# Patient Record
Sex: Male | Born: 1950 | Race: White | Hispanic: No | Marital: Married | Smoking: Current every day smoker
Health system: Southern US, Community
[De-identification: ages and names within clinical notes are randomized; demographics above are authoritative.]

## PROBLEM LIST (undated history)

## (undated) DIAGNOSIS — G20A1 Parkinson's disease without dyskinesia, without mention of fluctuations: Secondary | ICD-10-CM

## (undated) DIAGNOSIS — M1990 Unspecified osteoarthritis, unspecified site: Secondary | ICD-10-CM

## (undated) DIAGNOSIS — F419 Anxiety disorder, unspecified: Secondary | ICD-10-CM

## (undated) DIAGNOSIS — C61 Malignant neoplasm of prostate: Secondary | ICD-10-CM

## (undated) DIAGNOSIS — G20 Parkinson's disease: Secondary | ICD-10-CM

## (undated) DIAGNOSIS — M25561 Pain in right knee: Secondary | ICD-10-CM

## (undated) DIAGNOSIS — I10 Essential (primary) hypertension: Secondary | ICD-10-CM

## (undated) DIAGNOSIS — J302 Other seasonal allergic rhinitis: Secondary | ICD-10-CM

## (undated) DIAGNOSIS — K219 Gastro-esophageal reflux disease without esophagitis: Secondary | ICD-10-CM

## (undated) HISTORY — PX: PROSTATE BIOPSY: SHX241

## (undated) HISTORY — PX: OTHER SURGICAL HISTORY: SHX169

## (undated) HISTORY — PX: COLONOSCOPY: SHX174

---

## 2004-08-28 ENCOUNTER — Emergency Department (HOSPITAL_COMMUNITY): Admission: EM | Admit: 2004-08-28 | Discharge: 2004-08-28 | Payer: Self-pay | Admitting: Emergency Medicine

## 2005-06-02 ENCOUNTER — Encounter: Admission: RE | Admit: 2005-06-02 | Discharge: 2005-07-07 | Payer: Self-pay | Admitting: Family Medicine

## 2017-04-04 DIAGNOSIS — T7840XA Allergy, unspecified, initial encounter: Secondary | ICD-10-CM | POA: Diagnosis not present

## 2017-05-02 DIAGNOSIS — H43392 Other vitreous opacities, left eye: Secondary | ICD-10-CM | POA: Diagnosis not present

## 2017-05-28 DIAGNOSIS — N402 Nodular prostate without lower urinary tract symptoms: Secondary | ICD-10-CM | POA: Diagnosis not present

## 2017-05-28 DIAGNOSIS — J309 Allergic rhinitis, unspecified: Secondary | ICD-10-CM | POA: Diagnosis not present

## 2017-05-28 DIAGNOSIS — E7800 Pure hypercholesterolemia, unspecified: Secondary | ICD-10-CM | POA: Diagnosis not present

## 2017-05-28 DIAGNOSIS — I10 Essential (primary) hypertension: Secondary | ICD-10-CM | POA: Diagnosis not present

## 2017-05-28 DIAGNOSIS — Z23 Encounter for immunization: Secondary | ICD-10-CM | POA: Diagnosis not present

## 2017-05-28 DIAGNOSIS — M19049 Primary osteoarthritis, unspecified hand: Secondary | ICD-10-CM | POA: Diagnosis not present

## 2017-05-28 DIAGNOSIS — Z125 Encounter for screening for malignant neoplasm of prostate: Secondary | ICD-10-CM | POA: Diagnosis not present

## 2017-05-28 DIAGNOSIS — Z1211 Encounter for screening for malignant neoplasm of colon: Secondary | ICD-10-CM | POA: Diagnosis not present

## 2017-05-28 DIAGNOSIS — Z1159 Encounter for screening for other viral diseases: Secondary | ICD-10-CM | POA: Diagnosis not present

## 2017-05-28 DIAGNOSIS — Z0000 Encounter for general adult medical examination without abnormal findings: Secondary | ICD-10-CM | POA: Diagnosis not present

## 2017-05-29 ENCOUNTER — Other Ambulatory Visit: Payer: Self-pay | Admitting: Family Medicine

## 2017-05-29 DIAGNOSIS — Z136 Encounter for screening for cardiovascular disorders: Secondary | ICD-10-CM

## 2017-06-12 ENCOUNTER — Ambulatory Visit
Admission: RE | Admit: 2017-06-12 | Discharge: 2017-06-12 | Disposition: A | Discharge status: Home / Self Care | Payer: PPO | Source: Ambulatory Visit | Attending: Family Medicine | Admitting: Family Medicine

## 2017-06-12 DIAGNOSIS — Z136 Encounter for screening for cardiovascular disorders: Secondary | ICD-10-CM

## 2017-06-12 DIAGNOSIS — Z87891 Personal history of nicotine dependence: Secondary | ICD-10-CM | POA: Diagnosis not present

## 2017-08-24 DIAGNOSIS — K5730 Diverticulosis of large intestine without perforation or abscess without bleeding: Secondary | ICD-10-CM | POA: Diagnosis not present

## 2017-08-24 DIAGNOSIS — Z1211 Encounter for screening for malignant neoplasm of colon: Secondary | ICD-10-CM | POA: Diagnosis not present

## 2017-12-10 DIAGNOSIS — R972 Elevated prostate specific antigen [PSA]: Secondary | ICD-10-CM | POA: Diagnosis not present

## 2017-12-10 DIAGNOSIS — R3912 Poor urinary stream: Secondary | ICD-10-CM | POA: Diagnosis not present

## 2017-12-10 DIAGNOSIS — N401 Enlarged prostate with lower urinary tract symptoms: Secondary | ICD-10-CM | POA: Diagnosis not present

## 2017-12-25 DIAGNOSIS — E7800 Pure hypercholesterolemia, unspecified: Secondary | ICD-10-CM | POA: Diagnosis not present

## 2017-12-25 DIAGNOSIS — N402 Nodular prostate without lower urinary tract symptoms: Secondary | ICD-10-CM | POA: Diagnosis not present

## 2017-12-25 DIAGNOSIS — L989 Disorder of the skin and subcutaneous tissue, unspecified: Secondary | ICD-10-CM | POA: Diagnosis not present

## 2017-12-25 DIAGNOSIS — R7309 Other abnormal glucose: Secondary | ICD-10-CM | POA: Diagnosis not present

## 2018-01-24 DIAGNOSIS — R972 Elevated prostate specific antigen [PSA]: Secondary | ICD-10-CM | POA: Diagnosis not present

## 2018-02-01 ENCOUNTER — Other Ambulatory Visit (HOSPITAL_COMMUNITY): Payer: Self-pay | Admitting: Urology

## 2018-02-01 DIAGNOSIS — C61 Malignant neoplasm of prostate: Secondary | ICD-10-CM

## 2018-02-05 ENCOUNTER — Encounter: Payer: Self-pay | Admitting: Radiation Oncology

## 2018-02-08 ENCOUNTER — Encounter (HOSPITAL_COMMUNITY)
Admission: RE | Admit: 2018-02-08 | Discharge: 2018-02-08 | Disposition: A | Discharge status: Home / Self Care | Payer: PPO | Source: Ambulatory Visit | Attending: Urology | Admitting: Urology

## 2018-02-08 DIAGNOSIS — C61 Malignant neoplasm of prostate: Secondary | ICD-10-CM | POA: Insufficient documentation

## 2018-02-08 MED ORDER — TECHNETIUM TC 99M MEDRONATE IV KIT
20.0000 | PACK | Freq: Once | INTRAVENOUS | Status: AC | PRN
  Administered 2018-02-08: 20 via INTRAVENOUS

## 2018-02-25 ENCOUNTER — Other Ambulatory Visit: Payer: Self-pay

## 2018-02-25 ENCOUNTER — Ambulatory Visit
Admission: RE | Admit: 2018-02-25 | Discharge: 2018-02-25 | Disposition: A | Discharge status: Home / Self Care | Payer: PPO | Source: Ambulatory Visit | Attending: Radiation Oncology | Admitting: Radiation Oncology

## 2018-02-25 ENCOUNTER — Encounter: Payer: Self-pay | Admitting: Radiation Oncology

## 2018-02-25 ENCOUNTER — Encounter: Payer: Self-pay | Admitting: Medical Oncology

## 2018-02-25 ENCOUNTER — Encounter

## 2018-02-25 VITALS — BP 137/81 | HR 56 | Temp 98.3°F | Resp 20 | Ht 66.0 in | Wt 164.0 lb

## 2018-02-25 DIAGNOSIS — R972 Elevated prostate specific antigen [PSA]: Secondary | ICD-10-CM | POA: Diagnosis not present

## 2018-02-25 DIAGNOSIS — Z808 Family history of malignant neoplasm of other organs or systems: Secondary | ICD-10-CM | POA: Diagnosis not present

## 2018-02-25 DIAGNOSIS — C61 Malignant neoplasm of prostate: Secondary | ICD-10-CM | POA: Insufficient documentation

## 2018-02-25 HISTORY — DX: Malignant neoplasm of prostate: C61

## 2018-02-25 NOTE — Progress Notes (Signed)
See progress note under physician encounter. 

## 2018-02-25 NOTE — Progress Notes (Signed)
Introduced myself to Matthew [name] and his wife as the prostate nurse navigator and my role. He states he is feeling a little overwhelmed with the diagnosis and now hearing lot of information regarding treatment options. He has follow up tomorrow with Dr. Lovena Neighbours to discuss surgery in detail. I encouraged them to listen and go home and consider options. I asked them to reach out to me if they have questions or concerns.

## 2018-02-25 NOTE — Progress Notes (Signed)
GU Location of Tumor / Histology: prostatic adenocarcinoma  If Prostate Cancer, Gleason Score is (4 + 5) and PSA is (7.86). Prostate volume: 27.95 grams.   Matthew [name] reports that two years ago his PCP found a lump on his prostate. Explains that his urologist was unable to find the lump. Reports his PSA in January 2019 jumped from 3.5 to 5 thus he went back to his urologist. He goes onto say by the time his urologist was able to see him approximately 4 months later his PSA had jumped to 7.  Biopsies of prostate (if applicable) revealed:    Past/Anticipated interventions by urology, if any: prostate biopsy, CT and bone scan (negative), referral to radiation oncology  Past/Anticipated interventions by medical oncology, if any: no  Weight changes, if any: no  Bowel/Bladder complaints, if any: IPSS 10. SHIM 7. Denies dysuria. Denies hematuria. Reports rare scant leakage of urine.    Nausea/Vomiting, if any: no  Pain issues, if any:  Reports pain in groin since biopsy.   SAFETY ISSUES:  Prior radiation? no  Pacemaker/ICD? no  Possible current pregnancy? no  Is the patient on methotrexate? no  Current Complaints / other details:  67 year old male. Married with one daughter and one son. Smokes a pipe.

## 2018-02-25 NOTE — Progress Notes (Signed)
2 Radiation Oncology         [phone] ________________________________  Initial Outpatient Consultation  Name: Matthew [name] MRN: [id number]  Date: 02/25/2018  DOB: [birth year]  IZ:TIWP, Dwyane Luo, MD  Davis Gourd*   REFERRING PHYSICIAN: Davis Gourd*  DIAGNOSIS: 67 y.o. gentleman with high risk, Stage T1c adenocarcinoma of the prostate with Gleason Score of 4+5, and PSA of 7.86.     ICD-10-CM   1. Malignant neoplasm of prostate ([place]) C61     HISTORY OF PRESENT ILLNESS: Matthew [name] is a 67 y.o. male with a diagnosis of prostate cancer. He reports that two years ago his PCP found a lump on his prostate, but he explains that his urologist was unable to find the lump. He reports his PSA in January 2019 jumped from 3.5 in 2017 to 5 thus he went back to urology. He saw Dr. Lovena Neighbours on 12/10/17,  digital rectal examination was performed at that time revealing left lobe firm and larger but no nodules. PSA performed the same day was 7.86. The patient proceeded to transrectal ultrasound with 12 biopsies of the prostate on 01/24/18.  The prostate volume measured 27.9 cc.  Out of 12 core biopsies, all 12 were positive.  The maximum Gleason score was 4+5, and this was seen in left apex. Perineural invasion was identified in left mid lateral, right mid, and right base lateral. CT abdomen/pelvis on 02/01/18 and bone scan on 02/08/18 were both negative for metastatic disease. The patient reviewed the biopsy results with his urologist and he has kindly been referred today for discussion of potential radiation treatment options.   PREVIOUS RADIATION THERAPY: No  PAST MEDICAL HISTORY:  Past Medical History:  Diagnosis Date  . Prostate cancer ([place])       PAST SURGICAL HISTORY: Past Surgical History:  Procedure Laterality Date  . PROSTATE BIOPSY      FAMILY HISTORY:  Family History  Problem Relation Age of Onset  . Breast cancer Mother        45s  . Heart attack Father  91  . Colon cancer Maternal Grandmother   . Prostate cancer Neg Hx   . Pancreatic cancer Neg Hx     SOCIAL HISTORY:  Social History   Socioeconomic History  . Marital status: Married    Spouse name: Not on file  . Number of children: 2  . Years of education: Not on file  . Highest education level: Not on file  Occupational History  . Not on file  Social Needs  . Financial resource strain: Not on file  . Food insecurity:    Worry: Not on file    Inability: Not on file  . Transportation needs:    Medical: Not on file    Non-medical: Not on file  Tobacco Use  . Smoking status: Current Every Day Smoker    Years: 30.00    Types: Pipe  . Smokeless tobacco: Never Used  Substance and Sexual Activity  . Alcohol use: Never    Frequency: Never  . Drug use: Never  . Sexual activity: Not Currently  Lifestyle  . Physical activity:    Days per week: Not on file    Minutes per session: Not on file  . Stress: Not on file  Relationships  . Social connections:    Talks on phone: Not on file    Gets together: Not on file    Attends religious service: Not on file    Active member of  club or organization: Not on file    Attends meetings of clubs or organizations: Not on file    Relationship status: Not on file  . Intimate partner violence:    Fear of current or ex partner: Not on file    Emotionally abused: Not on file    Physically abused: Not on file    Forced sexual activity: Not on file  Other Topics Concern  . Not on file  Social History Narrative  . Not on file    ALLERGIES: Patient has no known allergies.  MEDICATIONS:  Current Outpatient Medications  Medication Sig Dispense Refill  . calcium citrate-vitamin D (CITRACAL+D) 315-200 MG-UNIT tablet Take 1 tablet by mouth 2 (two) times daily.    . cetirizine (ZYRTEC) 10 MG tablet Take 10 mg by mouth daily.    . Grape Seed Extract 100 MG CAPS Take by mouth.    Marland Kitchen lisinopril-hydrochlorothiazide (PRINZIDE,ZESTORETIC)  20-12.5 MG tablet     . vitamin B-12 (CYANOCOBALAMIN) 100 MCG tablet Take 100 mcg by mouth daily.    . tamsulosin (FLOMAX) 0.4 MG CAPS capsule Take 0.4 mg by mouth daily.  11   No current facility-administered medications for this encounter.     REVIEW OF SYSTEMS:  On review of systems, the patient reports that he is doing well overall. He denies any chest pain, shortness of breath, cough, fevers, chills, night sweats, unintended weight changes. He denies any bowel disturbances, and denies abdominal pain, nausea or vomiting. He reports groin pain since biopsy. His IPSS was 10, indicating moderate urinary symptoms. He is unable to complete sexual activity with most attempts. A complete review of systems is obtained and is otherwise negative.    PHYSICAL EXAM:  Wt Readings from Last 3 Encounters:  02/25/18 164 lb (74.4 kg)   Temp Readings from Last 3 Encounters:  02/25/18 98.3 F (36.8 C) (Oral)   BP Readings from Last 3 Encounters:  02/25/18 137/81   Pulse Readings from Last 3 Encounters:  02/25/18 (!) 56   Pain Assessment Pain Score: 0-No pain/10  In general this is a well appearing Caucasian gentleman in no acute distress. He is alert and oriented x4 and appropriate throughout the examination. Cardiopulmonary assessment is negative for acute distress and he exhibits normal effort.  Marland Kitchen   KPS = 100  100 - Normal; no complaints; no evidence of disease. 90   - Able to carry on normal activity; minor signs or symptoms of disease. 80   - Normal activity with effort; some signs or symptoms of disease. 26   - Cares for self; unable to carry on normal activity or to do active work. 60   - Requires occasional assistance, but is able to care for most of his personal needs. 50   - Requires considerable assistance and frequent medical care. 48   - Disabled; requires special care and assistance. 63   - Severely disabled; hospital admission is indicated although death not imminent. 43   -  Very sick; hospital admission necessary; active supportive treatment necessary. 10   - Moribund; fatal processes progressing rapidly. 0     - Dead  Karnofsky DA, Abelmann WH, Craver LS and Burchenal JH [phone]) The use of the nitrogen mustards in the palliative treatment of carcinoma: with particular reference to bronchogenic carcinoma Cancer 1 [id number]  LABORATORY DATA:  No results found for: WBC, HGB, HCT, MCV, PLT No results found for: NA, K, CL, CO2 No results found for: ALT, AST, GGT, ALKPHOS,  BILITOT   RADIOGRAPHY: Nm Bone Scan Whole Body  Result Date: 02/08/2018 CLINICAL DATA:  Prostate cancer.  History of right knee pain. EXAM: NUCLEAR MEDICINE WHOLE BODY BONE SCAN TECHNIQUE: Whole body anterior and posterior images were obtained approximately 3 hours after intravenous injection of radiopharmaceutical. RADIOPHARMACEUTICALS:  20 mCi Technetium-24m MDP IV COMPARISON:  CT of the abdomen pelvis 02/01/2018 FINDINGS: Normal uptake in the renal collecting system and urinary bladder. Markedly increased uptake involving the right knee particularly along the medial aspect. 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 No suspicious uptake in the pelvis, spine or ribs. Slightly increased uptake along the left side of the maxilla is probably odontogenic. IMPRESSION: 1. No evidence for metastatic bone disease. 2. Markedly increased uptake involving the right knee is likely related to significant degenerative disease. 3. Increased uptake along the left maxilla is probably related to dental disease. Electronically Signed   By: Markus Daft M.D.   On: 02/08/2018 17:04      IMPRESSION/PLAN: 1. 67 y.o. gentleman with high risk, Stage T1c adenocarcinoma of the prostate with Gleason Score of 4+5, and PSA of 7.86. Dr. Tammi Klippel discussed the patient's workup and outlines the nature of prostate cancer in this setting. The patient's T stage, Gleason's score, and PSA put him into the high risk group. Accordingly,  he is eligible for a variety of potential treatment options including 8 weeks of external radiation, or 5 weeks of external radiation followed by a brachytherapy boost versus surgical resection. We discussed the available radiation techniques, and focused on the details and logistics and delivery. We discussed and outlined the risks, benefits, short and long-term effects associated with radiotherapy and compared and contrasted these with prostatectomy. We discussed the role of SpaceOAR in reducing the rectal toxicity associated with radiotherapy. He will meet with Dr. Lovena Neighbours tomorrow and we will follow up with his decision making. 2. Possible genetic predisposition to malignancy. The patient is not quite a candidate for genetic testing but would be if another family member had breast or prostate cancer.     Carola Rhine, PAC    Tyler Pita, MD  [facility] Direct Dial: [phone]  Fax: [phone] [email or web address]  Skype  [email or web address]    Page Me     This document serves as a record of services personally performed by Shona Simpson, PA-C and Dr. Tammi Klippel. It was created on their behalf by Wilburn Mylar, a trained medical scribe. The creation of this record is based on the scribe's personal observations and the provider's statements to them. This document has been checked and approved by the attending provider.

## 2018-02-26 DIAGNOSIS — C61 Malignant neoplasm of prostate: Secondary | ICD-10-CM | POA: Diagnosis not present

## 2018-02-27 ENCOUNTER — Other Ambulatory Visit: Payer: Self-pay | Admitting: Urology

## 2018-02-27 DIAGNOSIS — C61 Malignant neoplasm of prostate: Secondary | ICD-10-CM | POA: Insufficient documentation

## 2018-03-01 DIAGNOSIS — N401 Enlarged prostate with lower urinary tract symptoms: Secondary | ICD-10-CM | POA: Diagnosis not present

## 2018-03-01 DIAGNOSIS — N393 Stress incontinence (female) (male): Secondary | ICD-10-CM | POA: Diagnosis not present

## 2018-03-01 DIAGNOSIS — R300 Dysuria: Secondary | ICD-10-CM | POA: Diagnosis not present

## 2018-03-01 DIAGNOSIS — M6281 Muscle weakness (generalized): Secondary | ICD-10-CM | POA: Diagnosis not present

## 2018-03-14 DIAGNOSIS — N393 Stress incontinence (female) (male): Secondary | ICD-10-CM | POA: Diagnosis not present

## 2018-03-14 DIAGNOSIS — C61 Malignant neoplasm of prostate: Secondary | ICD-10-CM | POA: Diagnosis not present

## 2018-03-14 DIAGNOSIS — M6281 Muscle weakness (generalized): Secondary | ICD-10-CM | POA: Diagnosis not present

## 2018-03-14 DIAGNOSIS — M62838 Other muscle spasm: Secondary | ICD-10-CM | POA: Diagnosis not present

## 2018-03-15 ENCOUNTER — Encounter (HOSPITAL_COMMUNITY): Payer: Self-pay

## 2018-03-15 DIAGNOSIS — Z23 Encounter for immunization: Secondary | ICD-10-CM | POA: Diagnosis not present

## 2018-03-15 NOTE — Patient Instructions (Addendum)
Your procedure is scheduled on: Friday, Nov. 1, 2019   Surgery Time:  12Noon-4:00PM   Report to [facility]  Entrance    Report to admitting at 10:00 AM   Call this number if you have problems the morning of surgery [phone]   Do not eat food or drink liquids :After Midnight.   Brush your teeth the morning of surgery.   Do NOT smoke after Midnight   Take these medicines the morning of surgery with A SIP OF WATER: Zyrtec, Colace              You may not have any metal on your body including jewelry, and body piercings             Do not wear lotions, powders, perfumes/cologne, or deodorant                        Men may shave face and neck.   Do not bring valuables to the hospital. [place].   Contacts, dentures or bridgework may not be worn into surgery.   Leave suitcase in the car. After surgery it may be brought to your room.    Special Instructions: Bring a copy of your healthcare power of attorney and living will documents         the day of surgery if you haven't scanned them in before.              Please read over the following fact sheets you were given:  [facility] - Preparing for Surgery Before surgery, you can play an important role.  Because skin is not sterile, your skin needs to be as free of germs as possible.  You can reduce the number of germs on your skin by washing with CHG (chlorahexidine gluconate) soap before surgery.  CHG is an antiseptic cleaner which kills germs and bonds with the skin to continue killing germs even after washing. Please DO NOT use if you have an allergy to CHG or antibacterial soaps.  If your skin becomes reddened/irritated stop using the CHG and inform your nurse when you arrive at Short Stay. Do not shave (including legs and underarms) for at least 48 hours prior to the first CHG shower.  You may shave your face/neck.  Please follow these instructions  carefully:  1.  Shower with CHG Soap the night before surgery and the  morning of surgery.  2.  If you choose to wash your hair, wash your hair first as usual with your normal  shampoo.  3.  After you shampoo, rinse your hair and body thoroughly to remove the shampoo.                             4.  Use CHG as you would any other liquid soap.  You can apply chg directly to the skin and wash.  Gently with a scrungie or clean washcloth.  5.  Apply the CHG Soap to your body ONLY FROM THE NECK DOWN.   Do   not use on face/ open                           Wound or open sores. Avoid contact with eyes, ears mouth and  genitals (private parts).                       Wash face,  Genitals (private parts) with your normal soap.             6.  Wash thoroughly, paying special attention to the area where your    surgery  will be performed.  7.  Thoroughly rinse your body with warm water from the neck down.  8.  DO NOT shower/wash with your normal soap after using and rinsing off the CHG Soap.                9.  Pat yourself dry with a clean towel.            10.  Wear clean pajamas.            11.  Place clean sheets on your bed the night of your first shower and do not  sleep with pets. Day of Surgery : Do not apply any lotions/deodorants the morning of surgery.  Please wear clean clothes to the hospital/surgery center.  FAILURE TO FOLLOW THESE INSTRUCTIONS MAY RESULT IN THE CANCELLATION OF YOUR SURGERY  PATIENT SIGNATURE_________________________________  NURSE SIGNATURE__________________________________  ________________________________________________________________________   Matthew [name]  An incentive spirometer is a tool that can help keep your lungs clear and active. This tool measures how well you are filling your lungs with each breath. Taking long deep breaths may help reverse or decrease the chance of developing breathing (pulmonary) problems (especially infection) following:  A  long period of time when you are unable to move or be active. BEFORE THE PROCEDURE   If the spirometer includes an indicator to show your best effort, your nurse or respiratory therapist will set it to a desired goal.  If possible, sit up straight or lean slightly forward. Try not to slouch.  Hold the incentive spirometer in an upright position. INSTRUCTIONS FOR USE  1. Sit on the edge of your bed if possible, or sit up as far as you can in bed or on a chair. 2. Hold the incentive spirometer in an upright position. 3. Breathe out normally. 4. Place the mouthpiece in your mouth and seal your lips tightly around it. 5. Breathe in slowly and as deeply as possible, raising the piston or the ball toward the top of the column. 6. Hold your breath for 3-5 seconds or for as long as possible. Allow the piston or ball to fall to the bottom of the column. 7. Remove the mouthpiece from your mouth and breathe out normally. 8. Rest for a few seconds and repeat Steps 1 through 7 at least 10 times every 1-2 hours when you are awake. Take your time and take a few normal breaths between deep breaths. 9. The spirometer may include an indicator to show your best effort. Use the indicator as a goal to work toward during each repetition. 10. After each set of 10 deep breaths, practice coughing to be sure your lungs are clear. If you have an incision (the cut made at the time of surgery), support your incision when coughing by placing a pillow or rolled up towels firmly against it. Once you are able to get out of bed, walk around indoors and cough well. You may stop using the incentive spirometer when instructed by your caregiver.  RISKS AND COMPLICATIONS  Take your time so you do not get dizzy or light-headed.  If you are in  pain, you may need to take or ask for pain medication before doing incentive spirometry. It is harder to take a deep breath if you are having pain. AFTER USE  Rest and breathe slowly and  easily.  It can be helpful to keep track of a log of your progress. Your caregiver can provide you with a simple table to help with this. If you are using the spirometer at home, follow these instructions: Matthew [name] IF:   You are having difficultly using the spirometer.  You have trouble using the spirometer as often as instructed.  Your pain medication is not giving enough relief while using the spirometer.  You develop fever of 100.5 F (38.1 C) or higher. SEEK IMMEDIATE MEDICAL CARE IF:   You cough up bloody sputum that had not been present before.  You develop fever of 102 F (38.9 C) or greater.  You develop worsening pain at or near the incision site. MAKE SURE YOU:   Understand these instructions.  Will watch your condition.  Will get help right away if you are not doing well or get worse. Document Released: 09/18/2006 Document Revised: 07/31/2011 Document Reviewed: 11/19/2006 Exit[employer] Patient Information 2014 Exit[employer], [place].   ________________________________________________________________________  WHAT IS A BLOOD TRANSFUSION? Blood Transfusion Information  A transfusion is the replacement of blood or some of its parts. Blood is made up of multiple cells which provide different functions.  Red blood cells carry oxygen and are used for blood loss replacement.  White blood cells fight against infection.  Platelets control bleeding.  Plasma helps clot blood.  Other blood products are available for specialized needs, such as hemophilia or other clotting disorders. BEFORE THE TRANSFUSION  Who gives blood for transfusions?   Healthy volunteers who are fully evaluated to make sure their blood is safe. This is blood bank blood. Transfusion therapy is the safest it has ever been in the practice of medicine. Before blood is taken from a donor, a complete history is taken to make sure that person has no history of diseases nor engages in risky social  behavior (examples are intravenous drug use or sexual activity with multiple partners). The donor's travel history is screened to minimize risk of transmitting infections, such as malaria. The donated blood is tested for signs of infectious diseases, such as HIV and hepatitis. The blood is then tested to be sure it is compatible with you in order to minimize the chance of a transfusion reaction. If you or a relative donates blood, this is often done in anticipation of surgery and is not appropriate for emergency situations. It takes many days to process the donated blood. RISKS AND COMPLICATIONS Although transfusion therapy is very safe and saves many lives, the main dangers of transfusion include:   Getting an infectious disease.  Developing a transfusion reaction. This is an allergic reaction to something in the blood you were given. Every precaution is taken to prevent this. The decision to have a blood transfusion has been considered carefully by your caregiver before blood is given. Blood is not given unless the benefits outweigh the risks. AFTER THE TRANSFUSION  Right after receiving a blood transfusion, you will usually feel much better and more energetic. This is especially true if your red blood cells have gotten low (anemic). The transfusion raises the level of the red blood cells which carry oxygen, and this usually causes an energy increase.  The nurse administering the transfusion will monitor you carefully for complications. HOME CARE INSTRUCTIONS  No special instructions are needed after a transfusion. You may find your energy is better. Speak with your caregiver about any limitations on activity for underlying diseases you may have. SEEK MEDICAL CARE IF:   Your condition is not improving after your transfusion.  You develop redness or irritation at the intravenous (IV) site. SEEK IMMEDIATE MEDICAL CARE IF:  Any of the following symptoms occur over the next 12 hours:  Shaking  chills.  You have a temperature by mouth above 102 F (38.9 C), not controlled by medicine.  Chest, back, or muscle pain.  People around you feel you are not acting correctly or are confused.  Shortness of breath or difficulty breathing.  Dizziness and fainting.  You get a rash or develop hives.  You have a decrease in urine output.  Your urine turns a dark color or changes to pink, red, or brown. Any of the following symptoms occur over the next 10 days:  You have a temperature by mouth above 102 F (38.9 C), not controlled by medicine.  Shortness of breath.  Weakness after normal activity.  The white part of the eye turns yellow (jaundice).  You have a decrease in the amount of urine or are urinating less often.  Your urine turns a dark color or changes to pink, red, or brown. Document Released: 05/05/2000 Document Revised: 07/31/2011 Document Reviewed: 12/23/2007 [facility] Patient Information 2014 [place], [place].  _______________________________________________________________________

## 2018-03-19 ENCOUNTER — Other Ambulatory Visit: Payer: Self-pay

## 2018-03-19 ENCOUNTER — Encounter (HOSPITAL_COMMUNITY)
Admission: RE | Admit: 2018-03-19 | Discharge: 2018-03-19 | Disposition: A | Discharge status: Home / Self Care | Payer: PPO | Source: Ambulatory Visit | Attending: Urology | Admitting: Urology

## 2018-03-19 ENCOUNTER — Encounter (HOSPITAL_COMMUNITY): Payer: Self-pay

## 2018-03-19 DIAGNOSIS — C61 Malignant neoplasm of prostate: Secondary | ICD-10-CM | POA: Insufficient documentation

## 2018-03-19 DIAGNOSIS — Z01818 Encounter for other preprocedural examination: Secondary | ICD-10-CM | POA: Insufficient documentation

## 2018-03-19 HISTORY — DX: Gastro-esophageal reflux disease without esophagitis: K21.9

## 2018-03-19 HISTORY — DX: Other seasonal allergic rhinitis: J30.2

## 2018-03-19 HISTORY — DX: Essential (primary) hypertension: I10

## 2018-03-19 HISTORY — DX: Unspecified osteoarthritis, unspecified site: M19.90

## 2018-03-19 HISTORY — DX: Pain in right knee: M25.561

## 2018-03-19 LAB — BASIC METABOLIC PANEL
Anion gap: 7 (ref 5–15)
BUN: 14 mg/dL (ref 8–23)
CO2: 28 mmol/L (ref 22–32)
Calcium: 9.2 mg/dL (ref 8.9–10.3)
Chloride: 103 mmol/L (ref 98–111)
Creatinine, Ser: 0.86 mg/dL (ref 0.61–1.24)
GFR calc Af Amer: >60 mL/min (ref >60)
GFR calc non Af Amer: >60 mL/min (ref >60)
Glucose, Bld: 132 mg/dL — ABNORMAL HIGH (ref 70–99)
Potassium: 3.9 mmol/L (ref 3.5–5.1)
Sodium: 138 mmol/L (ref 135–145)

## 2018-03-19 LAB — CBC
HCT: 45.5 % (ref 39.0–52.0)
Hemoglobin: 15.3 g/dL (ref 13.0–17.0)
MCH: 30.7 pg (ref 26.0–34.0)
MCHC: 33.6 g/dL (ref 30.0–36.0)
MCV: 91.2 fL (ref 80.0–100.0)
Platelets: 143 10*3/uL — ABNORMAL LOW (ref 150–400)
RBC: 4.99 MIL/uL (ref 4.22–5.81)
RDW: 12 % (ref 11.5–15.5)
WBC: 5.2 10*3/uL (ref 4.0–10.5)
nRBC: 0 % (ref 0.0–0.2)

## 2018-03-19 LAB — ABO/RH: ABO/RH(D): O POS

## 2018-03-19 NOTE — Pre-Procedure Instructions (Signed)
EKG 04/04/2017 in hard chart  CBC and BMP results 03/19/2018 sent to Dr. Dema Severin via epic.

## 2018-03-20 ENCOUNTER — Telehealth: Payer: Self-pay | Admitting: Radiation Oncology

## 2018-03-20 NOTE — Telephone Encounter (Signed)
Called to wish pt all the best with his upcoming surgery for prostate cancer treatment.

## 2018-03-22 ENCOUNTER — Encounter: Payer: Self-pay | Admitting: Medical Oncology

## 2018-03-22 ENCOUNTER — Encounter (HOSPITAL_COMMUNITY): Payer: Self-pay | Admitting: Emergency Medicine

## 2018-03-22 ENCOUNTER — Ambulatory Visit (HOSPITAL_COMMUNITY): Payer: PPO | Admitting: Certified Registered"

## 2018-03-22 ENCOUNTER — Ambulatory Visit (HOSPITAL_COMMUNITY)
Admission: RE | Admit: 2018-03-22 | Discharge: 2018-03-24 | Disposition: A | Discharge status: Home / Self Care | Payer: PPO | Source: Ambulatory Visit | Attending: Urology | Admitting: Urology

## 2018-03-22 ENCOUNTER — Other Ambulatory Visit: Payer: Self-pay

## 2018-03-22 ENCOUNTER — Encounter (HOSPITAL_COMMUNITY)
Admission: RE | Disposition: A | Discharge status: Home / Self Care | Payer: Self-pay | Source: Ambulatory Visit | Attending: Urology

## 2018-03-22 DIAGNOSIS — Z79899 Other long term (current) drug therapy: Secondary | ICD-10-CM | POA: Diagnosis not present

## 2018-03-22 DIAGNOSIS — I10 Essential (primary) hypertension: Secondary | ICD-10-CM | POA: Insufficient documentation

## 2018-03-22 DIAGNOSIS — M1990 Unspecified osteoarthritis, unspecified site: Secondary | ICD-10-CM | POA: Diagnosis not present

## 2018-03-22 DIAGNOSIS — C61 Malignant neoplasm of prostate: Secondary | ICD-10-CM | POA: Insufficient documentation

## 2018-03-22 DIAGNOSIS — K219 Gastro-esophageal reflux disease without esophagitis: Secondary | ICD-10-CM | POA: Diagnosis not present

## 2018-03-22 DIAGNOSIS — C775 Secondary and unspecified malignant neoplasm of intrapelvic lymph nodes: Secondary | ICD-10-CM | POA: Insufficient documentation

## 2018-03-22 DIAGNOSIS — R110 Nausea: Secondary | ICD-10-CM | POA: Diagnosis not present

## 2018-03-22 HISTORY — PX: ROBOT ASSISTED LAPAROSCOPIC RADICAL PROSTATECTOMY: SHX5141

## 2018-03-22 HISTORY — PX: PELVIC LYMPH NODE DISSECTION: SHX6543

## 2018-03-22 SURGERY — PROSTATECTOMY, RADICAL, ROBOT-ASSISTED, LAPAROSCOPIC
Anesthesia: General

## 2018-03-22 MED ORDER — CEFAZOLIN SODIUM-DEXTROSE 2-4 GM/100ML-% IV SOLN
2.0000 g | Freq: Once | INTRAVENOUS | Status: AC
  Administered 2018-03-22 (×2): 2 g via INTRAVENOUS
  Filled 2018-03-22: qty 100

## 2018-03-22 MED ORDER — LIDOCAINE 2% (20 MG/ML) 5 ML SYRINGE
INTRAMUSCULAR | Status: AC
  Filled 2018-03-22: qty 5

## 2018-03-22 MED ORDER — SUGAMMADEX SODIUM 200 MG/2ML IV SOLN
INTRAVENOUS | Status: DC | PRN
  Administered 2018-03-22: 200 mg via INTRAVENOUS

## 2018-03-22 MED ORDER — MIDAZOLAM HCL 2 MG/2ML IJ SOLN
INTRAMUSCULAR | Status: AC
  Filled 2018-03-22: qty 2

## 2018-03-22 MED ORDER — OXYBUTYNIN CHLORIDE 5 MG PO TABS: 5.0000 mg | ORAL_TABLET | Freq: Three times a day (TID) | ORAL | 2 refills | Status: DC | PRN

## 2018-03-22 MED ORDER — LACTATED RINGERS IR SOLN
Status: DC | PRN
  Administered 2018-03-22: 1

## 2018-03-22 MED ORDER — PROMETHAZINE HCL 25 MG/ML IJ SOLN
6.2500 mg | INTRAMUSCULAR | Status: DC | PRN
  Administered 2018-03-22: 6.25 mg via INTRAVENOUS

## 2018-03-22 MED ORDER — ROCURONIUM BROMIDE 10 MG/ML (PF) SYRINGE
PREFILLED_SYRINGE | INTRAVENOUS | Status: AC
  Filled 2018-03-22: qty 10

## 2018-03-22 MED ORDER — FENTANYL CITRATE (PF) 100 MCG/2ML IJ SOLN
INTRAMUSCULAR | Status: DC | PRN
  Administered 2018-03-22 (×2): 50 ug via INTRAVENOUS

## 2018-03-22 MED ORDER — ROCURONIUM BROMIDE 10 MG/ML (PF) SYRINGE
PREFILLED_SYRINGE | INTRAVENOUS | Status: AC
  Filled 2018-03-22: qty 20

## 2018-03-22 MED ORDER — OXYCODONE HCL 5 MG/5ML PO SOLN
5.0000 mg | Freq: Once | ORAL | Status: DC | PRN
  Filled 2018-03-22: qty 5

## 2018-03-22 MED ORDER — LACTATED RINGERS IV SOLN
INTRAVENOUS | Status: DC
  Administered 2018-03-22 (×3): via INTRAVENOUS

## 2018-03-22 MED ORDER — LIDOCAINE 2% (20 MG/ML) 5 ML SYRINGE
INTRAMUSCULAR | Status: DC | PRN
  Administered 2018-03-22: 100 mg via INTRAVENOUS

## 2018-03-22 MED ORDER — HYDROCODONE-ACETAMINOPHEN 5-325 MG PO TABS: 1.0000 | ORAL_TABLET | ORAL | 0 refills | Status: AC | PRN

## 2018-03-22 MED ORDER — OXYCODONE HCL 5 MG PO TABS: 10.0000 mg | ORAL_TABLET | ORAL | Status: DC | PRN

## 2018-03-22 MED ORDER — OXYCODONE HCL 5 MG PO TABS: 5.0000 mg | ORAL_TABLET | Freq: Once | ORAL | Status: DC | PRN

## 2018-03-22 MED ORDER — FENTANYL CITRATE (PF) 250 MCG/5ML IJ SOLN
INTRAMUSCULAR | Status: DC | PRN
  Administered 2018-03-22 (×5): 50 ug via INTRAVENOUS

## 2018-03-22 MED ORDER — ACETAMINOPHEN 10 MG/ML IV SOLN: 1000.0000 mg | Freq: Once | INTRAVENOUS | Status: DC | PRN

## 2018-03-22 MED ORDER — ONDANSETRON HCL 4 MG/2ML IJ SOLN
INTRAMUSCULAR | Status: AC
  Filled 2018-03-22: qty 2

## 2018-03-22 MED ORDER — SODIUM CHLORIDE 0.9 % IV SOLN
2.0000 g | Freq: Three times a day (TID) | INTRAVENOUS | Status: AC
  Administered 2018-03-23 (×2): 2 g via INTRAVENOUS
  Filled 2018-03-22 (×4): qty 2

## 2018-03-22 MED ORDER — SENNA 8.6 MG PO TABS: 1.0000 | ORAL_TABLET | Freq: Every day | ORAL | 0 refills | Status: AC

## 2018-03-22 MED ORDER — ONDANSETRON HCL 4 MG PO TABS: 4.0000 mg | ORAL_TABLET | Freq: Three times a day (TID) | ORAL | 0 refills | Status: AC | PRN

## 2018-03-22 MED ORDER — ACETAMINOPHEN 500 MG PO TABS: 1000.0000 mg | ORAL_TABLET | Freq: Once | ORAL | Status: DC | PRN

## 2018-03-22 MED ORDER — DEXAMETHASONE SODIUM PHOSPHATE 10 MG/ML IJ SOLN
INTRAMUSCULAR | Status: DC | PRN
  Administered 2018-03-22: 8 mg via INTRAVENOUS

## 2018-03-22 MED ORDER — ACETAMINOPHEN 160 MG/5ML PO SOLN: 1000.0000 mg | Freq: Once | ORAL | Status: DC | PRN

## 2018-03-22 MED ORDER — DEXAMETHASONE SODIUM PHOSPHATE 10 MG/ML IJ SOLN
INTRAMUSCULAR | Status: AC
  Filled 2018-03-22: qty 1

## 2018-03-22 MED ORDER — SODIUM CHLORIDE 0.9 % IV SOLN
INTRAVENOUS | Status: AC
  Administered 2018-03-22: 20:00:00 via INTRAVENOUS

## 2018-03-22 MED ORDER — FENTANYL CITRATE (PF) 250 MCG/5ML IJ SOLN
INTRAMUSCULAR | Status: AC
  Filled 2018-03-22: qty 5

## 2018-03-22 MED ORDER — BUPIVACAINE LIPOSOME 1.3 % IJ SUSP
20.0000 mL | Freq: Once | INTRAMUSCULAR | Status: AC
  Administered 2018-03-22: 20 mL
  Filled 2018-03-22: qty 20

## 2018-03-22 MED ORDER — SODIUM CHLORIDE 0.9 % IV BOLUS
1000.0000 mL | Freq: Once | INTRAVENOUS | Status: AC
  Administered 2018-03-22: 1000 mL via INTRAVENOUS

## 2018-03-22 MED ORDER — OXYBUTYNIN CHLORIDE 5 MG PO TABS: 5.0000 mg | ORAL_TABLET | Freq: Three times a day (TID) | ORAL | Status: DC | PRN

## 2018-03-22 MED ORDER — MORPHINE SULFATE (PF) 2 MG/ML IV SOLN
1.0000 mg | INTRAVENOUS | Status: DC | PRN
  Administered 2018-03-22 (×2): 1 mg via INTRAVENOUS
  Filled 2018-03-22 (×2): qty 1

## 2018-03-22 MED ORDER — CEFAZOLIN SODIUM-DEXTROSE 2-4 GM/100ML-% IV SOLN
INTRAVENOUS | Status: AC
  Filled 2018-03-22: qty 100

## 2018-03-22 MED ORDER — SUGAMMADEX SODIUM 200 MG/2ML IV SOLN
INTRAVENOUS | Status: AC
  Filled 2018-03-22: qty 2

## 2018-03-22 MED ORDER — ACETAMINOPHEN 500 MG PO TABS
1000.0000 mg | ORAL_TABLET | Freq: Four times a day (QID) | ORAL | Status: DC
  Administered 2018-03-22 – 2018-03-24 (×6): 1000 mg via ORAL
  Filled 2018-03-22 (×8): qty 2

## 2018-03-22 MED ORDER — FENTANYL CITRATE (PF) 100 MCG/2ML IJ SOLN
INTRAMUSCULAR | Status: AC
  Administered 2018-03-22: 25 ug via INTRAVENOUS
  Filled 2018-03-22: qty 2

## 2018-03-22 MED ORDER — ROCURONIUM BROMIDE 10 MG/ML (PF) SYRINGE
PREFILLED_SYRINGE | INTRAVENOUS | Status: DC | PRN
  Administered 2018-03-22: 10 mg via INTRAVENOUS
  Administered 2018-03-22: 5 mg via INTRAVENOUS
  Administered 2018-03-22: 10 mg via INTRAVENOUS
  Administered 2018-03-22: 50 mg via INTRAVENOUS
  Administered 2018-03-22: 20 mg via INTRAVENOUS
  Administered 2018-03-22: 10 mg via INTRAVENOUS
  Administered 2018-03-22: 20 mg via INTRAVENOUS

## 2018-03-22 MED ORDER — PHENAZOPYRIDINE HCL 100 MG PO TABS
100.0000 mg | ORAL_TABLET | Freq: Three times a day (TID) | ORAL | Status: DC | PRN
  Filled 2018-03-22: qty 1

## 2018-03-22 MED ORDER — MIDAZOLAM HCL 2 MG/2ML IJ SOLN
INTRAMUSCULAR | Status: DC | PRN
  Administered 2018-03-22: 2 mg via INTRAVENOUS

## 2018-03-22 MED ORDER — ALBUMIN HUMAN 5 % IV SOLN
INTRAVENOUS | Status: AC
  Filled 2018-03-22: qty 500

## 2018-03-22 MED ORDER — BUPIVACAINE HCL (PF) 0.5 % IJ SOLN
INTRAMUSCULAR | Status: AC
  Filled 2018-03-22: qty 30

## 2018-03-22 MED ORDER — OXYCODONE HCL 5 MG PO TABS
5.0000 mg | ORAL_TABLET | ORAL | Status: DC | PRN
  Administered 2018-03-23 (×4): 5 mg via ORAL
  Filled 2018-03-22 (×4): qty 1

## 2018-03-22 MED ORDER — PROPOFOL 10 MG/ML IV BOLUS
INTRAVENOUS | Status: DC | PRN
  Administered 2018-03-22: 150 mg via INTRAVENOUS

## 2018-03-22 MED ORDER — FENTANYL CITRATE (PF) 100 MCG/2ML IJ SOLN
25.0000 ug | INTRAMUSCULAR | Status: DC | PRN
  Administered 2018-03-22 (×3): 25 ug via INTRAVENOUS

## 2018-03-22 MED ORDER — ALBUMIN HUMAN 5 % IV SOLN
INTRAVENOUS | Status: DC | PRN
  Administered 2018-03-22: 15:00:00 via INTRAVENOUS

## 2018-03-22 MED ORDER — ONDANSETRON HCL 4 MG/2ML IJ SOLN
4.0000 mg | INTRAMUSCULAR | Status: DC | PRN
  Administered 2018-03-23 (×2): 4 mg via INTRAVENOUS
  Filled 2018-03-22 (×2): qty 2

## 2018-03-22 MED ORDER — BUPIVACAINE HCL (PF) 0.5 % IJ SOLN
INTRAMUSCULAR | Status: DC | PRN
  Administered 2018-03-22: 30 mL

## 2018-03-22 MED ORDER — FENTANYL CITRATE (PF) 100 MCG/2ML IJ SOLN
INTRAMUSCULAR | Status: AC
  Filled 2018-03-22: qty 2

## 2018-03-22 MED ORDER — STERILE WATER FOR IRRIGATION IR SOLN
Status: DC | PRN
  Administered 2018-03-22: 1000 mL

## 2018-03-22 MED ORDER — SULFAMETHOXAZOLE-TRIMETHOPRIM 800-160 MG PO TABS: 1.0000 | ORAL_TABLET | Freq: Two times a day (BID) | ORAL | 0 refills | Status: AC

## 2018-03-22 MED ORDER — ONDANSETRON HCL 4 MG/2ML IJ SOLN
INTRAMUSCULAR | Status: DC | PRN
  Administered 2018-03-22: 4 mg via INTRAVENOUS

## 2018-03-22 MED ORDER — PHENAZOPYRIDINE HCL 100 MG PO TABS: 100.0000 mg | ORAL_TABLET | Freq: Three times a day (TID) | ORAL | 0 refills | Status: AC | PRN

## 2018-03-22 MED ORDER — PROMETHAZINE HCL 25 MG/ML IJ SOLN
INTRAMUSCULAR | Status: AC
  Administered 2018-03-22: 6.25 mg via INTRAVENOUS
  Filled 2018-03-22: qty 1

## 2018-03-22 SURGICAL SUPPLY — 58 items
ADH SKN CLS APL DERMABOND .7 (GAUZE/BANDAGES/DRESSINGS) ×2
APL SWBSTK 6 STRL LF DISP (MISCELLANEOUS) ×2
APPLICATOR COTTON TIP 6 STRL (MISCELLANEOUS) ×2 IMPLANT
APPLICATOR COTTON TIP 6IN STRL (MISCELLANEOUS) ×3
CATH FOLEY 2WAY SLVR  5CC 20FR (CATHETERS) ×2
CATH FOLEY 2WAY SLVR 5CC 20FR (CATHETERS) ×4 IMPLANT
CATH ROBINSON RED A/P 8FR (CATHETERS) ×3 IMPLANT
CHLORAPREP W/TINT 26ML (MISCELLANEOUS) ×3 IMPLANT
CLIP VESOLOCK LG 6/CT PURPLE (CLIP) ×6 IMPLANT
COVER SURGICAL LIGHT HANDLE (MISCELLANEOUS) ×3 IMPLANT
COVER TIP SHEARS 8 DVNC (MISCELLANEOUS) ×2 IMPLANT
COVER TIP SHEARS 8MM DA VINCI (MISCELLANEOUS) ×1
COVER WAND RF STERILE (DRAPES) IMPLANT
DECANTER SPIKE VIAL GLASS SM (MISCELLANEOUS) ×3 IMPLANT
DERMABOND ADVANCED (GAUZE/BANDAGES/DRESSINGS) ×1
DERMABOND ADVANCED .7 DNX12 (GAUZE/BANDAGES/DRESSINGS) IMPLANT
DRAPE ARM DVNC X/XI (DISPOSABLE) ×8 IMPLANT
DRAPE COLUMN DVNC XI (DISPOSABLE) ×2 IMPLANT
DRAPE DA VINCI XI ARM (DISPOSABLE) ×4
DRAPE DA VINCI XI COLUMN (DISPOSABLE) ×1
DRAPE SURG IRRIG POUCH 19X23 (DRAPES) ×3 IMPLANT
DRSG TEGADERM 2-3/8X2-3/4 SM (GAUZE/BANDAGES/DRESSINGS) ×15 IMPLANT
DRSG TEGADERM 4X4.75 (GAUZE/BANDAGES/DRESSINGS) ×3 IMPLANT
ELECT REM PT RETURN 15FT ADLT (MISCELLANEOUS) ×3 IMPLANT
GAUZE SPONGE 2X2 8PLY STRL LF (GAUZE/BANDAGES/DRESSINGS) IMPLANT
GLOVE BIO SURGEON STRL SZ 6.5 (GLOVE) ×3 IMPLANT
GLOVE BIOGEL M STRL SZ7.5 (GLOVE) ×6 IMPLANT
GOWN STRL REUS W/TWL LRG LVL3 (GOWN DISPOSABLE) ×9 IMPLANT
HEMOSTAT SURGICEL 4X8 (HEMOSTASIS) ×1 IMPLANT
HOLDER FOLEY CATH W/STRAP (MISCELLANEOUS) ×3 IMPLANT
IRRIG SUCT STRYKERFLOW 2 WTIP (MISCELLANEOUS) ×3
IRRIGATION SUCT STRKRFLW 2 WTP (MISCELLANEOUS) ×2 IMPLANT
IV LACTATED RINGERS 1000ML (IV SOLUTION) ×3 IMPLANT
NDL INSUFFLATION 14GA 120MM (NEEDLE) ×2 IMPLANT
NEEDLE INSUFFLATION 14GA 120MM (NEEDLE) ×3 IMPLANT
PACK ROBOT UROLOGY CUSTOM (CUSTOM PROCEDURE TRAY) ×3 IMPLANT
PAD POSITIONING PINK XL (MISCELLANEOUS) ×3 IMPLANT
PORT ACCESS TROCAR AIRSEAL 12 (TROCAR) ×2 IMPLANT
PORT ACCESS TROCAR AIRSEAL 5M (TROCAR) ×1
SCISSORS LAP 5X35 DISP (ENDOMECHANICALS) ×1 IMPLANT
SEAL CANN UNIV 5-8 DVNC XI (MISCELLANEOUS) ×8 IMPLANT
SEAL XI 5MM-8MM UNIVERSAL (MISCELLANEOUS) ×4
SET TRI-LUMEN FLTR TB AIRSEAL (TUBING) ×3 IMPLANT
SOLUTION ELECTROLUBE (MISCELLANEOUS) ×3 IMPLANT
SPONGE GAUZE 2X2 STER 10/PKG (GAUZE/BANDAGES/DRESSINGS) ×1
STAPLER VISISTAT 35W (STAPLE) ×3 IMPLANT
SUT ETHILON 2 0 PS N (SUTURE) ×1 IMPLANT
SUT ETHILON 2 0 PSLX (SUTURE) ×3 IMPLANT
SUT MNCRL 3 0 RB1 (SUTURE) ×2 IMPLANT
SUT MNCRL 3 0 VIOLET RB1 (SUTURE) ×2 IMPLANT
SUT MNCRL AB 4-0 PS2 18 (SUTURE) ×2 IMPLANT
SUT MONOCRYL 3 0 RB1 (SUTURE) ×3
SUT VIC AB 0 CT1 27 (SUTURE) ×6
SUT VIC AB 0 CT1 27XBRD ANTBC (SUTURE) ×2 IMPLANT
SUT VICRYL 0 UR6 27IN ABS (SUTURE) ×6 IMPLANT
SYRINGE IRR TOOMEY STRL 70CC (SYRINGE) ×3 IMPLANT
TOWEL OR NON WOVEN STRL DISP B (DISPOSABLE) ×3 IMPLANT
WATER STERILE IRR 1000ML POUR (IV SOLUTION) ×3 IMPLANT

## 2018-03-22 NOTE — Anesthesia Procedure Notes (Signed)
Date/Time: 03/22/2018 5:47 PM Performed by: Cynda Familia, CRNA Oxygen Delivery Method: Simple face mask Placement Confirmation: breath sounds checked- equal and bilateral and positive ETCO2 Dental Injury: Teeth and Oropharynx as per pre-operative assessment

## 2018-03-22 NOTE — Discharge Instructions (Signed)
Foley Catheter Care, Adult °A soft, flexible tube (Foley catheter) has been placed in your bladder. This may be done to temporarily help with urine drainage after an operation or to relieve blockage from an enlarged prostate gland. °HOME CARE INSTRUCTIONS  °If you are going home with a Foley catheter in place, follow these instructions: ° °Taking Care of the Catheter: °· Keep the area where the catheter leaves your body clean. °· Attach the catheter to the leg so there is no tension on the catheter. °· Keep the drainage bag below the level of the bladder, but keep it OFF the floor. °· Do not take long soaking baths.  °· Wash your hands before touching ANYTHING related to the catheter or bag. °· Using mild soap and warm water on a washcloth: °· Clean the area closest to the catheter insertion site using a circular motion around the catheter. °· Clean the catheter itself by wiping AWAY from the insertion site for several inches down the tube. °· NEVER wipe upward as this could sweep bacteria up into the urethra (tube in your body that normally drains the bladder) and cause infection. °Taking Care of the Drainage Bags: °· Two drainage bags will be taken home: a large overnight drainage bag, and a smaller leg bag which fits underneath clothing. °· It is okay to wear the overnight bag at any time, but NEVER wear the smaller leg bag at night. °· Keep the drainage bag well below the level of your bladder. This prevents backflow of urine into the bladder and allows the urine to drain freely. °· Anchor the tubing to your leg to prevent pulling or tension on the catheter. Use tape or a leg strap provided by the hospital. °· Empty the drainage bag when it is ½ to ¾ full. Wash your hands before and after touching the bag. °· Periodically check the tubing for kinks to make sure there is no pressure on the tubing which could restrict the flow of urine. °Changing the Drainage Bags: °· Cleanse both ends of the clean bag with  alcohol before changing. °· Pinch off the rubber catheter to avoid urine spillage during the disconnection. °· Disconnect the dirty bag and connect the clean one. °· Empty the dirty bag carefully to avoid a urine spill. °· Attach the new bag to the leg with tape or a leg strap. °Cleaning the Drainage Bags: °· Whenever a drainage bag is disconnected, it must be cleaned quickly so it is ready for the next use. °· Wash the bag in warm, soapy water. °· Rinse the bag thoroughly with warm water. °· Soak the bag for 30 minutes in a solution of white vinegar and water (1 cup vinegar to 1 quart warm water). °· Rinse with warm water. °SEEK MEDICAL CARE IF:  °· Some pain develops in the kidney (lower back) area. °· The urine is cloudy or smells bad. °· There is some blood in the urine. °· The catheter becomes clogged and/or there is no urine drainage. °SEEK IMMEDIATE MEDICAL CARE IF:  °· You have moderate or severe pain in the kidney region. °· You start to throw up (vomit). °· Blood fills the tube. °· Worsening belly (abdominal) pain develops. °· You have a fever. °MAKE SURE YOU:  °· Understand these instructions. °· Will watch your condition. °· Will get help right away if you are not doing well or get worse. ° °Call [facility] if you have any questions or concerns: [phone] ° ° °·   Activity:  You are encouraged to ambulate frequently (about every hour during waking hours) to help prevent blood clots from forming in your legs or lungs.  However, you should not engage in any heavy lifting (> 10-15 lbs), strenuous activity, or straining. ° °· Diet: You should advance your diet as instructed by your physician.  It will be normal to have some bloating, nausea, and abdominal discomfort intermittently. ° °· Prescriptions:  You will be provided a prescription for pain medication to take as needed.  If your pain is not severe enough to require the prescription pain medication, you may take extra strength Tylenol instead  which will have less side effects.  You should also take a prescribed stool softener to avoid straining with bowel movements as the prescription pain medication may constipate you. ° °· Incisions: You may remove your dressing bandages 48 hours after surgery if not removed in the hospital.  You will either have some small staples or special tissue glue at each of the incision sites. Once the bandages are removed (if present), the incisions may stay open to air.  You may start showering (but not soaking or bathing in water) the 2nd day after surgery and the incisions simply need to be patted dry after the shower.  No additional care is needed. ° °· What to call us about: You should call the office ([phone]) if you develop fever > 101 or develop persistent vomiting. Activity:  You are encouraged to ambulate frequently (about every hour during waking hours) to help prevent blood clots from forming in your legs or lungs.  However, you should not engage in any heavy lifting (> 10-15 lbs), strenuous activity, or straining. ° ° ° ° ° °

## 2018-03-22 NOTE — Anesthesia Preprocedure Evaluation (Signed)
Anesthesia Evaluation  Patient identified by MRN, date of birth, ID band Patient awake    Reviewed: Allergy & Precautions, NPO status , Patient's Chart, lab work & pertinent test results  History of Anesthesia Complications Negative for: history of anesthetic complications  Airway Mallampati: III  TM Distance: >3 FB Neck ROM: Full    Dental  (+) Teeth Intact   Pulmonary neg shortness of breath, neg COPD, Recent URI , Resolved, Current Smoker,    breath sounds clear to auscultation       Cardiovascular hypertension, Pt. on medications (-) angina(-) Past MI and (-) CHF (-) dysrhythmias  Rhythm:Regular     Neuro/Psych negative neurological ROS  negative psych ROS   GI/Hepatic Neg liver ROS, GERD  Controlled,  Endo/Other  negative endocrine ROS  Renal/GU negative Renal ROS     Musculoskeletal  (+) Arthritis ,   Abdominal   Peds  Hematology negative hematology ROS (+)   Anesthesia Other Findings   Reproductive/Obstetrics                             Anesthesia Physical Anesthesia Plan  ASA: II  Anesthesia Plan: General   Post-op Pain Management:    Induction: Intravenous  PONV Risk Score and Plan: 1 and Ondansetron and Dexamethasone  Airway Management Planned: Oral ETT  Additional Equipment: None  Intra-op Plan:   Post-operative Plan: Extubation in OR  Informed Consent: I have reviewed the patients History and Physical, chart, labs and discussed the procedure including the risks, benefits and alternatives for the proposed anesthesia with the patient or authorized representative who has indicated his/her understanding and acceptance.   Dental advisory given  Plan Discussed with: CRNA and Surgeon  Anesthesia Plan Comments:         Anesthesia Quick Evaluation

## 2018-03-22 NOTE — Transfer of Care (Signed)
Immediate Anesthesia Transfer of Care Note  Patient: Matthew [name]  Procedure(s) Performed: XI ROBOTIC ASSISTED LAPAROSCOPIC PROSTATECTOMY (N/A ) PELVIC LYMPH NODE DISSECTION (Bilateral )  Patient Location: PACU  Anesthesia Type:General  Level of Consciousness: sedated  Airway & Oxygen Therapy: Patient Spontanous Breathing and Patient connected to face mask oxygen  Post-op Assessment: Report given to RN and Post -op Vital signs reviewed and stable  Post vital signs: Reviewed and stable  Last Vitals:  Vitals Value Taken Time  BP 136/77 03/22/2018  5:55 PM  Temp    Pulse 65 03/22/2018  5:57 PM  Resp 12 03/22/2018  5:57 PM  SpO2 100 % 03/22/2018  5:57 PM  Vitals shown include unvalidated device data.  Last Pain:  Vitals:   03/22/18 1040  TempSrc:   PainSc: 2       Patients Stated Pain Goal: 4 (45/80/99 8338)  Complications: No apparent anesthesia complications

## 2018-03-22 NOTE — Progress Notes (Signed)
Spoke with Dr. Lovena Neighbours via phone. Verified that pt did not need TED hose for this surgery.

## 2018-03-22 NOTE — H&P (Signed)
Urology Preoperative H&P   Chief Complaint: Prostate cancer   History of Present Illness: Matthew [name] is a 67 y.o. male with a history of a T1c, Gleason 4+5 prostate cancer.  Last PSA- 7.86 (11/2017), 5.7 (05/2017)-- up from 3.02 (03/2016), 3.17 (03/2015). No personal/family history of prostate cancer.   TNM stage: T1c  Gleason score: G4+5  Biopsy: 01/24/18  Left: G4+5 (LMA-80%), G4+3 (5 out 6 left sided cores)  Right: G4+4 (RMA- 80%), (RMM- 80%) G4+3 (4 out of 6 right sided cores)  Prostate volume: 27.9 cm^3   CT ABD/PEL 02/01/2018  1. Prostate is grossly unremarkable in this patient with known prostate cancer.  2. No evidence of metastatic disease.   BONE SCAN (02/08/18)  -No evidence of bony metastatic disease    SHIM- 3   The patient has met with Dr. Tammi Klippel with radiation oncology and discussed his radiotherapeutic treatment options.  He has elected to proceed with a robot assisted prostatectomy.  He denies interval changes in his urinary habits, UTIs, dysuria or hematuria.    Past Medical History:  Diagnosis Date  . Arthritis   . GERD (gastroesophageal reflux disease)    occ remote history  . Hypertension   . Prostate cancer ([place])   . Right knee pain    questionable meniscal tear  . Seasonal allergies     Past Surgical History:  Procedure Laterality Date  . COLONOSCOPY    . Leg Laceration Repair  Left    Age 34  . PROSTATE BIOPSY      Allergies:  Allergies  Allergen Reactions  . Other     Senior dose of flu shot, cause hear palpitations, anxious  . Peanut-Containing Drug Products Other (See Comments)    Nasal congestion    Family History  Problem Relation Age of Onset  . Breast cancer Mother        68s  . Heart attack Father 51  . Colon cancer Maternal Grandmother   . Prostate cancer Neg Hx   . Pancreatic cancer Neg Hx     Social History:  reports that he has been smoking pipe. He has smoked for the past 20.00 years. He has never used smokeless  tobacco. He reports that he drinks alcohol. He reports that he does not use drugs.  ROS: A complete review of systems was performed.  All systems are negative except for pertinent findings as noted.  Physical Exam:  Vital signs in last 24 hours:   Constitutional:  Alert and oriented, No acute distress Cardiovascular: Regular rate and rhythm, No JVD Respiratory: Normal respiratory effort, Lungs clear bilaterally GI: Abdomen is soft, nontender, nondistended, no abdominal masses GU: No CVA tenderness Lymphatic: No lymphadenopathy Neurologic: Grossly intact, no focal deficits Psychiatric: Normal mood and affect  Laboratory Data:  Recent Labs    03/19/18 1143  WBC 5.2  HGB 15.3  HCT 45.5  PLT 143*    Recent Labs    03/19/18 1143  NA 138  K 3.9  CL 103  GLUCOSE 132*  BUN 14  CALCIUM 9.2  CREATININE 0.86     No results found for this or any previous visit (from the past 24 hour(s)). No results found for this or any previous visit (from the past 240 hour(s)).  Renal Function: Recent Labs    03/19/18 1143  CREATININE 0.86   Estimated Creatinine Clearance: 76.2 mL/min (by C-G formula based on SCr of 0.86 mg/dL).  Radiologic Imaging: No results found.  I independently reviewed the  above imaging studies.  Assessment and Plan Rondrick Barreira is a 67 y.o. male with high risk Gleason 9 prostate cancer.   -The patient was counseled about the natural history of prostate cancer and the standard treatment options that are available for prostate cancer. It was explained to him how his age and life expectancy, clinical stage, Gleason score, and PSA affect his prognosis, the decision to proceed with additional staging studies, as well as how that information influences recommended treatment strategies. We discussed the roles for active surveillance, radiation therapy, surgical therapy, androgen deprivation, as well as ablative therapy options for the treatment of prostate cancer  as appropriate to his individual cancer situation. We discussed the risks and benefits of these options with regard to their impact on cancer control and also in terms of potential adverse events, complications, and impact on quality of life particularly related to urinary and sexual function. The patient and his wife were encouraged to ask questions throughout the discussion today and all questions were answered to his stated satisfaction. In addition, the patient was provided with and/or directed to appropriate resources and literature for further education about prostate cancer and treatment options.   We discussed surgical therapy for prostate cancer including the different available surgical approaches. We discussed, in detail, the risks and expectations of surgery with regard to cancer control, urinary control, and erectile function as well as the expected postoperative recovery process. Additional risks of surgery including but not limited to bleeding, infection, hernia formation, nerve damage, lymphocele formation, bowel/rectal injury potentially necessitating colostomy, damage to the urinary tract resulting in urine leakage, urethral stricture, and the cardiopulmonary risks such as myocardial infarction, stroke, death, venothromboembolism, etc. were explained. The risk of open surgical conversion for robotic/laparoscopic prostatectomy was also discussed. We also discussed the potential risk of a positive surgical margin and the need for androgen deprivation therapy and/or salvage/aduvant radiation therapy following his prostatectomy.   -He voices understanding and wishes to proceed with robotic-assisted laparoscopic prostatectomy with bilateral pelvic lymph node dissection. Again, we discussed in detail the potential risk of him having a positive surgical margin as well as some potentially needing to have adjuvant/salvage radiation therapy despite surgery.     Ellison Hughs, MD 03/22/2018,  10:04 AM  [facility] Pager: [phone]

## 2018-03-22 NOTE — Anesthesia Procedure Notes (Signed)
Procedure Name: Intubation Date/Time: 03/22/2018 1:03 PM Performed by: Niel Hummer, CRNA Pre-anesthesia Checklist: Patient identified, Emergency Drugs available, Suction available and Patient being monitored Patient Re-evaluated:Patient Re-evaluated prior to induction Oxygen Delivery Method: Circle system utilized Preoxygenation: Pre-oxygenation with 100% oxygen Induction Type: IV induction Ventilation: Mask ventilation without difficulty Laryngoscope Size: Mac and 3 Grade View: Grade I Tube type: Oral Tube size: 7.5 mm Number of attempts: 1 Airway Equipment and Method: Stylet Placement Confirmation: ETT inserted through vocal cords under direct vision,  positive ETCO2 and breath sounds checked- equal and bilateral Secured at: 23 cm Tube secured with: Tape Dental Injury: Teeth and Oropharynx as per pre-operative assessment

## 2018-03-22 NOTE — Op Note (Addendum)
Operative Note  Preoperative diagnosis:  1.  Prostate cancer  Postoperative diagnosis: 1.  Prostate cancer  Procedure(s): 1.  RALP with BLPLND (non-nerve sparing)  Surgeon: Ellison Hughs, MD  Assistants:  Andee Poles, MD (PGY-4)  Anesthesia:  General  Complications:  None  EBL:  400 mL  Specimens: 1.  Prostate and bilateral seminal vesicles  2.  Bilateral pelvic lymph nodes 3.  Periprostatic adipose tissue  Drains/Catheters: 1.  20 French 2-way Foley with 20 mL in the balloon  Intraoperative findings:   1. Water tight vesicourethral anastomosis   Indication:  Matthew [name] is a 67 y.o. male with high risk Gleason 4+5 prostate cancer.  The risks, benefits and alternatives of the above procedures was discussed with the patient pre-operatively.  He voices understanding and wishes to proceed.   Description of procedure:  After informed consent was obtained and signed, the patient was brought back to the operating room and placed on the operating room table in the supine position. General endotracheal anesthesia was induced.  The patient appropriately padded and was converted to lithotomy position. His abdomen, genitals, and perineum were widely prepped and draped into a sterile field. A 20 French Foley catheter was then placed sterilely with drainage of clear urine observed.  A suprapubic 12 mm horizontal incision was made. The Veress needle was then inserted into the peritoneal cavity.  Saline drop test of the Veress needle revealed no signs of obstruction and aspiration of the needle revealed no blood or sucus.  The abdomen was then insufflated with CO2 gas to a maximum pressure of 15 mmHg. The Veress needle was removed, and a 8 mm trocar was placed atraumatically.  The robotic camera was used to inspect the sight of entry into the abdominal cavity and revealed no signs of adjacent organ or vessel injury.  Under direct laparoscopic vision, three additional 8 mm trochars  and one 12 mm trocar was placed at or below the level of the umbilicus, in a standard sawtooth configuration. The patient was then placed in steep Trendelenburg position and the robot was docked.  The patient's left colon was moderately adherent to the lateral sidewall. This was taken down with a combination of sharp and blunt dissection.  The urachus was identified, grasped, and incised using monopolar cautery. The space of Retzius was developed spanning distally to the pubic bone and laterally to the vas deferens. The areolar fibrofatty tissue involving the prostate gland was taken down and the prostate was completely defatted. The endopelvic fascia was opened on the patient's right, then left side from the base of the prostate to the apex. This dissection created an apical notch to aid in identification of the dorsal venous comlex.  A 0 Vicryl suture on a CT-1 needle was then placed in a figure-of-eight fashion around the dorsal venous complex and tied down.   The anterior bladder neck was identified, and incised using monopolar cautery. The Foley catheter was then encountered, deflated, and brought up to the anterior abdominal wall using the accessory robotic arm. The lateral and posterior bladder neck fibers were then taken down creating a 20 French bladder neck. The trigone was inspected and both ureteral orifices were identified. There was an unusual asymmetry to his trigone, and the insertion point of the left ureteral orifice. However, both orifices were well away from the plane of dissection. The dissection was then carried down posteriorly until the seminal vesicles and vas deferens were identified. These were sequentially dissected using a combination of  sharp and blunt dissection with judicious pinpoint electrocautery. The plane involving Denonvilliers fascia was dissected posteriorly, and the perirectal fat was identified. The rectum was swept away from the underside of the prostate gland. The  lateral bladder neck fibers and the prostate pedicles were then divided between Hem-o-lok clips and coldly transected.   Attention was then turned to the apical dissection. The dorsal venous complex was divided using monopolar cautery. The urethra was skeletonized circumferentially and incised over its anterior border using cold scissors. The catheter was brought back to the cut edge of the urethra and the posterior urethra was transected using cold scissors. The prostate was placed in a laparoscopic Endo Catch bag, and positioned in the upper abdomen for later retrieval.  The prostate fossa was then irrigated, suctioned, and inspected for hemostasis. Close inspection of the rectum did not demonstrate a rectal injury. Hemostasis was achieved and attention was then turned to the pelvic lymph nodes. The right external iliac vein was identified and a standard lymph node dissection was performed spanning distally to Cloquet's node, proximally to the iliac bifurcation, laterally to the pelvic sidewall and posteriorly beyond the level of the obturator nerve. The proximal and distal extent of the pelvic lymph node packet was controlled using Hem-o-lok clips. The specimen was then removed from the abdomen through the assistant port and sent for permanent pathologic evaluation. The left side lymph node dissection was performed in a similar fashion with its limits as described above.  The bladder neck was then identified and a 3-0 Monocryl suture on a UR-6 needle was placed in an outside-to-in fashion at the 6 o'clock position of the bladder neck. A standard vesicourethral anastomosis was then run from the 6 o'clock position to the 12 o'clock position, first on the patient's left and then on his right. A new 66 French Foley catheter was placed under direct vision into the patient's bladder and the 2 ends of the suture were tied at the 12 o'clock position. 20 mL of sterile water were placed in the Foley catheter balloon  and the catheter was irrigated with sterile water. There is no evidence of an anastomotic leak or gross hematuria. The robot was undocked.  The Endo Catch bag string was then brought out of the midline abdominal port and a JP drain was placed in the left lower quadrant port into the dependent portion of the pelvis. It was secured to the skin using a 2-0 nylon suture. All ports were then removed under direct visualization. The midline rectus was then divided in a horizontal fashion to accommodate the specimen extraction. The specimen was removed from the patient's abdomen and sent for permanent pathologic evaluation. The rectus fascia was then closed using a 0 -Vicryl in a running fashion. Likewise, the 12 mm trocar site in the right lower quadrant was also closed in a figure-of-eight fashion with a 0 Vicryl. skin incisions were closed using skin staples, and sterile dressings were applied. The patient was converted from lithotomy to a supine position, awakened, extubated and taken to the recovery room. He tolerated the procedure well.  Plan:  Advance diet as tolerated.  Home with Foley.  JP is to be removed on POD 1 if output is appropriate.

## 2018-03-23 ENCOUNTER — Encounter (HOSPITAL_COMMUNITY): Payer: Self-pay

## 2018-03-23 DIAGNOSIS — C61 Malignant neoplasm of prostate: Secondary | ICD-10-CM | POA: Diagnosis not present

## 2018-03-23 LAB — BASIC METABOLIC PANEL
Anion gap: 7 (ref 5–15)
BUN: 11 mg/dL (ref 8–23)
CO2: 24 mmol/L (ref 22–32)
Calcium: 8.2 mg/dL — ABNORMAL LOW (ref 8.9–10.3)
Chloride: 108 mmol/L (ref 98–111)
Creatinine, Ser: 0.94 mg/dL (ref 0.61–1.24)
GFR calc Af Amer: >60 mL/min (ref >60)
GFR calc non Af Amer: >60 mL/min (ref >60)
Glucose, Bld: 118 mg/dL — ABNORMAL HIGH (ref 70–99)
Potassium: 3.9 mmol/L (ref 3.5–5.1)
Sodium: 139 mmol/L (ref 135–145)

## 2018-03-23 LAB — CBC
HCT: 33.1 % — ABNORMAL LOW (ref 39.0–52.0)
Hemoglobin: 11.1 g/dL — ABNORMAL LOW (ref 13.0–17.0)
MCH: 31 pg (ref 26.0–34.0)
MCHC: 33.5 g/dL (ref 30.0–36.0)
MCV: 92.5 fL (ref 80.0–100.0)
Platelets: 134 10*3/uL — ABNORMAL LOW (ref 150–400)
RBC: 3.58 MIL/uL — ABNORMAL LOW (ref 4.22–5.81)
RDW: 11.9 % (ref 11.5–15.5)
WBC: 10.5 10*3/uL (ref 4.0–10.5)
nRBC: 0 % (ref 0.0–0.2)

## 2018-03-23 MED ORDER — VITAMINS A & D EX OINT
TOPICAL_OINTMENT | CUTANEOUS | Status: AC
  Administered 2018-03-23: 05:00:00
  Filled 2018-03-23: qty 5

## 2018-03-23 MED ORDER — MENTHOL 3 MG MT LOZG
1.0000 | LOZENGE | OROMUCOSAL | Status: DC | PRN
  Filled 2018-03-23: qty 9

## 2018-03-23 NOTE — Progress Notes (Signed)
Pt passing some flatus(gas) with hypoactive BS, tolerating diet without nausea. Taking in liquids, encouraging ambulation. Will continue to monitor. SRP,RN

## 2018-03-23 NOTE — Anesthesia Postprocedure Evaluation (Signed)
Anesthesia Post Note  Patient: Billyjoe Go  Procedure(s) Performed: XI ROBOTIC ASSISTED LAPAROSCOPIC PROSTATECTOMY (N/A ) PELVIC LYMPH NODE DISSECTION (Bilateral )     Patient location during evaluation: PACU Anesthesia Type: General Level of consciousness: awake and alert Pain management: pain level controlled Vital Signs Assessment: post-procedure vital signs reviewed and stable Respiratory status: spontaneous breathing, nonlabored ventilation, respiratory function stable and patient connected to nasal cannula oxygen Cardiovascular status: blood pressure returned to baseline and stable Postop Assessment: no apparent nausea or vomiting Anesthetic complications: no    Last Vitals:  Vitals:   03/23/18 0640 03/23/18 1359  BP: 135/73 123/65  Pulse: (!) 55 63  Resp: 16 14  Temp: 36.9 C 36.8 C  SpO2: 94% 96%    Last Pain:  Vitals:   03/23/18 1737  TempSrc:   PainSc: Asleep                 Chelsey L Woodrum

## 2018-03-23 NOTE — Progress Notes (Signed)
Doing well - walked halls last night and this AM. Tolerating clears.   Vitals:   03/23/18 0216 03/23/18 0640  BP: (!) 142/73 135/73  Pulse: 62 (!) 55  Resp: 18 16  Temp: 97.8 F (36.6 C) 98.4 F (36.9 C)  SpO2: 99% 94%    Intake/Output Summary (Last 24 hours) at 03/23/2018 1051 Last data filed at 03/23/2018 0600 Gross per 24 hour  Intake 4097.26 ml  Output 1900 ml  Net 2197.26 ml   JP 25 last count  NAD Watching TV No focal neuro deficits Abd - soft NT Ext - no calf pain or swelling  GU - urine clear   CBC    Component Value Date/Time   WBC 10.5 03/23/2018 0406   RBC 3.58 (L) 03/23/2018 0406   HGB 11.1 (L) 03/23/2018 0406   HCT 33.1 (L) 03/23/2018 0406   PLT 134 (L) 03/23/2018 0406   MCV 92.5 03/23/2018 0406   MCH 31.0 03/23/2018 0406   MCHC 33.5 03/23/2018 0406   RDW 11.9 03/23/2018 0406   BMET    Component Value Date/Time   NA 139 03/23/2018 0406   K 3.9 03/23/2018 0406   CL 108 03/23/2018 0406   CO2 24 03/23/2018 0406   GLUCOSE 118 (H) 03/23/2018 0406   BUN 11 03/23/2018 0406   CREATININE 0.94 03/23/2018 0406   CALCIUM 8.2 (L) 03/23/2018 0406   GFRNONAA >60 03/23/2018 0406   GFRAA >60 03/23/2018 0406     A/p -  POD#1 RALP --  -reg diet -he'll likely be ready to go later today or in AM

## 2018-03-23 NOTE — Progress Notes (Signed)
MD return call and plan to discharge pt in am. Enc patient to ambulate and take oral intake. SRP, RN

## 2018-03-24 ENCOUNTER — Encounter (HOSPITAL_COMMUNITY): Payer: Self-pay | Admitting: Urology

## 2018-03-24 DIAGNOSIS — C61 Malignant neoplasm of prostate: Secondary | ICD-10-CM | POA: Diagnosis not present

## 2018-03-24 NOTE — Discharge Summary (Signed)
Physician Discharge Summary  Patient ID: Matthew [name] MRN: [id number] DOB/AGE: 1951-03-27 67 y.o.  Admit date: 03/22/2018 Discharge date: 03/24/2018  Admission Diagnoses: Prostate cancer  Discharge Diagnoses:  Active Problems:   Prostate cancer [facility])   Discharged Condition: good  Hospital Course: Patient was admitted following robot-assisted laparoscopic radical prostatectomy.  He has done well.  On postop day 1 he was ambulating and tolerating a regular diet but had crampy abdominal pain and nausea consistent with resolving ileus.  Today he is well.  He tolerating a regular diet and ambulating.  Ready for discharge.  Good urine output and low JP output.  Consults: None  Significant Diagnostic Studies: None  Treatments: surgery: Robot assisted laparoscopic radical prostatectomy  Discharge Exam: Blood pressure (!) 143/74, pulse 62, temperature 99.5 F (37.5 C), temperature source Oral, resp. rate 14, height 5\' 6"  (1.676 m), weight 73.5 kg, SpO2 96 %. No acute distress Brushing his teeth He sat back down in the bed Alert and oriented x3 No focal neurologic deficits Abdomen soft and nontender, incisions clean dry and intact Urine clear, Foley in place No calf pain or swelling  Disposition: Discharge disposition: 01-Home or Self Care        Allergies as of 03/24/2018      Reactions   Other    Senior dose of flu shot, cause hear palpitations, anxious   Peanut-containing Drug Products Other (See Comments)   Nasal congestion      Medication List    TAKE these medications   cetirizine 10 MG tablet Commonly known as:  ZYRTEC Take 10 mg by mouth every morning.   docusate sodium 100 MG capsule Commonly known as:  COLACE Take 100 mg by mouth 2 (two) times daily.   fluticasone 50 MCG/ACT nasal spray Commonly known as:  FLONASE Place 1-2 sprays into both nostrils daily as needed for allergies or rhinitis.   HYDROcodone-acetaminophen 5-325 MG tablet Commonly known  as:  NORCO/VICODIN Take 1 tablet by mouth every 4 (four) hours as needed for up to 5 days for severe pain.   ibuprofen 200 MG tablet Commonly known as:  ADVIL,MOTRIN Take 400 mg by mouth every 6 (six) hours as needed for headache or moderate pain.   lisinopril-hydrochlorothiazide 20-12.5 MG tablet Commonly known as:  PRINZIDE,ZESTORETIC Take 1 tablet by mouth every morning.   ondansetron 4 MG tablet Commonly known as:  ZOFRAN Take 1 tablet (4 mg total) by mouth every 8 (eight) hours as needed for up to 5 days for nausea or vomiting.   oxybutynin 5 MG tablet Commonly known as:  DITROPAN Take 1 tablet (5 mg total) by mouth every 8 (eight) hours as needed for bladder spasms.   phenazopyridine 100 MG tablet Commonly known as:  PYRIDIUM Take 1 tablet (100 mg total) by mouth 3 (three) times daily as needed for up to 2 days (bladder pain).   senna 8.6 MG Tabs tablet Commonly known as:  SENOKOT Take 1 tablet (8.6 mg total) by mouth daily. Stop for loose stools.   sildenafil 20 MG tablet Commonly known as:  REVATIO Take 20 mg by mouth as needed.   sulfamethoxazole-trimethoprim 800-160 MG tablet Commonly known as:  BACTRIM DS,SEPTRA DS Take 1 tablet by mouth 2 (two) times daily for 3 days. Start the day before your follow-up appointment.      Follow-up Information    Ceasar Mons, MD On 03/29/2018.   Specialty:  Urology Why:  Catheter removal  Contact information: [place]  [place] [address] [phone]           Signed: Festus Aloe 03/24/2018, 10:01 AM

## 2018-03-26 LAB — BPAM RBC
Blood Product Expiration Date: 201912032359
Blood Product Expiration Date: 201912032359
ISSUE DATE / TIME: 201911011439
ISSUE DATE / TIME: 201911011439
Unit Type and Rh: 5100
Unit Type and Rh: 5100

## 2018-03-26 LAB — TYPE AND SCREEN
ABO/RH(D): O POS
Antibody Screen: NEGATIVE
Unit division: 0
Unit division: 0

## 2018-04-22 DIAGNOSIS — R1031 Right lower quadrant pain: Secondary | ICD-10-CM | POA: Diagnosis not present

## 2018-04-22 DIAGNOSIS — R300 Dysuria: Secondary | ICD-10-CM | POA: Diagnosis not present

## 2018-04-22 DIAGNOSIS — M62838 Other muscle spasm: Secondary | ICD-10-CM | POA: Diagnosis not present

## 2018-04-22 DIAGNOSIS — M6281 Muscle weakness (generalized): Secondary | ICD-10-CM | POA: Diagnosis not present

## 2018-04-22 DIAGNOSIS — N393 Stress incontinence (female) (male): Secondary | ICD-10-CM | POA: Diagnosis not present

## 2018-04-24 DIAGNOSIS — N393 Stress incontinence (female) (male): Secondary | ICD-10-CM | POA: Diagnosis not present

## 2018-04-24 DIAGNOSIS — M62838 Other muscle spasm: Secondary | ICD-10-CM | POA: Diagnosis not present

## 2018-04-24 DIAGNOSIS — M6281 Muscle weakness (generalized): Secondary | ICD-10-CM | POA: Diagnosis not present

## 2018-04-24 DIAGNOSIS — R1031 Right lower quadrant pain: Secondary | ICD-10-CM | POA: Diagnosis not present

## 2018-04-30 DIAGNOSIS — N393 Stress incontinence (female) (male): Secondary | ICD-10-CM | POA: Diagnosis not present

## 2018-04-30 DIAGNOSIS — M62838 Other muscle spasm: Secondary | ICD-10-CM | POA: Diagnosis not present

## 2018-04-30 DIAGNOSIS — M6200 Separation of muscle (nontraumatic), unspecified site: Secondary | ICD-10-CM | POA: Diagnosis not present

## 2018-04-30 DIAGNOSIS — M6281 Muscle weakness (generalized): Secondary | ICD-10-CM | POA: Diagnosis not present

## 2018-04-30 DIAGNOSIS — R1031 Right lower quadrant pain: Secondary | ICD-10-CM | POA: Diagnosis not present

## 2018-05-03 DIAGNOSIS — C61 Malignant neoplasm of prostate: Secondary | ICD-10-CM | POA: Diagnosis not present

## 2018-05-08 DIAGNOSIS — R102 Pelvic and perineal pain: Secondary | ICD-10-CM | POA: Diagnosis not present

## 2018-05-08 DIAGNOSIS — M6200 Separation of muscle (nontraumatic), unspecified site: Secondary | ICD-10-CM | POA: Diagnosis not present

## 2018-05-08 DIAGNOSIS — N393 Stress incontinence (female) (male): Secondary | ICD-10-CM | POA: Diagnosis not present

## 2018-05-08 DIAGNOSIS — M62838 Other muscle spasm: Secondary | ICD-10-CM | POA: Diagnosis not present

## 2018-05-08 DIAGNOSIS — M6281 Muscle weakness (generalized): Secondary | ICD-10-CM | POA: Diagnosis not present

## 2018-05-10 DIAGNOSIS — C61 Malignant neoplasm of prostate: Secondary | ICD-10-CM | POA: Diagnosis not present

## 2018-05-10 DIAGNOSIS — R8271 Bacteriuria: Secondary | ICD-10-CM | POA: Diagnosis not present

## 2018-05-23 DIAGNOSIS — N393 Stress incontinence (female) (male): Secondary | ICD-10-CM | POA: Diagnosis not present

## 2018-05-23 DIAGNOSIS — M6200 Separation of muscle (nontraumatic), unspecified site: Secondary | ICD-10-CM | POA: Diagnosis not present

## 2018-05-23 DIAGNOSIS — M62838 Other muscle spasm: Secondary | ICD-10-CM | POA: Diagnosis not present

## 2018-05-23 DIAGNOSIS — M6281 Muscle weakness (generalized): Secondary | ICD-10-CM | POA: Diagnosis not present

## 2018-06-05 DIAGNOSIS — N393 Stress incontinence (female) (male): Secondary | ICD-10-CM | POA: Diagnosis not present

## 2018-06-05 DIAGNOSIS — R1031 Right lower quadrant pain: Secondary | ICD-10-CM | POA: Diagnosis not present

## 2018-06-05 DIAGNOSIS — R102 Pelvic and perineal pain: Secondary | ICD-10-CM | POA: Diagnosis not present

## 2018-06-05 DIAGNOSIS — M6200 Separation of muscle (nontraumatic), unspecified site: Secondary | ICD-10-CM | POA: Diagnosis not present

## 2018-06-05 DIAGNOSIS — M6281 Muscle weakness (generalized): Secondary | ICD-10-CM | POA: Diagnosis not present

## 2018-06-17 DIAGNOSIS — N393 Stress incontinence (female) (male): Secondary | ICD-10-CM | POA: Diagnosis not present

## 2018-06-17 DIAGNOSIS — R300 Dysuria: Secondary | ICD-10-CM | POA: Diagnosis not present

## 2018-06-17 DIAGNOSIS — M6281 Muscle weakness (generalized): Secondary | ICD-10-CM | POA: Diagnosis not present

## 2018-06-17 DIAGNOSIS — R102 Pelvic and perineal pain: Secondary | ICD-10-CM | POA: Diagnosis not present

## 2018-06-24 DIAGNOSIS — C61 Malignant neoplasm of prostate: Secondary | ICD-10-CM | POA: Diagnosis not present

## 2018-07-01 DIAGNOSIS — N393 Stress incontinence (female) (male): Secondary | ICD-10-CM | POA: Diagnosis not present

## 2018-07-01 DIAGNOSIS — C61 Malignant neoplasm of prostate: Secondary | ICD-10-CM | POA: Diagnosis not present

## 2018-07-04 DIAGNOSIS — Z0000 Encounter for general adult medical examination without abnormal findings: Secondary | ICD-10-CM | POA: Diagnosis not present

## 2018-07-04 DIAGNOSIS — Z23 Encounter for immunization: Secondary | ICD-10-CM | POA: Diagnosis not present

## 2018-07-04 DIAGNOSIS — I10 Essential (primary) hypertension: Secondary | ICD-10-CM | POA: Diagnosis not present

## 2018-07-04 DIAGNOSIS — C61 Malignant neoplasm of prostate: Secondary | ICD-10-CM | POA: Diagnosis not present

## 2018-07-04 DIAGNOSIS — E7800 Pure hypercholesterolemia, unspecified: Secondary | ICD-10-CM | POA: Diagnosis not present

## 2018-07-04 DIAGNOSIS — M549 Dorsalgia, unspecified: Secondary | ICD-10-CM | POA: Diagnosis not present

## 2018-07-05 DIAGNOSIS — N393 Stress incontinence (female) (male): Secondary | ICD-10-CM | POA: Diagnosis not present

## 2018-07-05 DIAGNOSIS — R102 Pelvic and perineal pain: Secondary | ICD-10-CM | POA: Diagnosis not present

## 2018-07-05 DIAGNOSIS — M6281 Muscle weakness (generalized): Secondary | ICD-10-CM | POA: Diagnosis not present

## 2018-07-05 DIAGNOSIS — M62838 Other muscle spasm: Secondary | ICD-10-CM | POA: Diagnosis not present

## 2018-09-30 DIAGNOSIS — C61 Malignant neoplasm of prostate: Secondary | ICD-10-CM | POA: Diagnosis not present

## 2018-10-11 ENCOUNTER — Telehealth: Payer: Self-pay | Admitting: Urology

## 2018-10-11 NOTE — Telephone Encounter (Signed)
Contacted pt to verify appt thru webex for pre reg

## 2018-10-15 ENCOUNTER — Encounter: Payer: Self-pay | Admitting: Urology

## 2018-10-15 ENCOUNTER — Ambulatory Visit
Admission: RE | Admit: 2018-10-15 | Discharge: 2018-10-15 | Disposition: A | Discharge status: Home / Self Care | Payer: PPO | Source: Ambulatory Visit | Attending: Radiation Oncology | Admitting: Radiation Oncology

## 2018-10-15 ENCOUNTER — Other Ambulatory Visit: Payer: Self-pay

## 2018-10-15 ENCOUNTER — Ambulatory Visit
Admission: RE | Admit: 2018-10-15 | Discharge: 2018-10-15 | Disposition: A | Discharge status: Home / Self Care | Payer: PPO | Source: Ambulatory Visit | Attending: Urology | Admitting: Urology

## 2018-10-15 ENCOUNTER — Encounter: Payer: Self-pay | Admitting: Medical Oncology

## 2018-10-15 DIAGNOSIS — C61 Malignant neoplasm of prostate: Secondary | ICD-10-CM

## 2018-10-15 DIAGNOSIS — Z9079 Acquired absence of other genital organ(s): Secondary | ICD-10-CM | POA: Diagnosis not present

## 2018-10-15 DIAGNOSIS — R9721 Rising PSA following treatment for malignant neoplasm of prostate: Secondary | ICD-10-CM | POA: Diagnosis not present

## 2018-10-15 DIAGNOSIS — R3989 Other symptoms and signs involving the genitourinary system: Secondary | ICD-10-CM | POA: Diagnosis not present

## 2018-10-15 NOTE — Progress Notes (Signed)
Radiation Oncology         [phone] ________________________________  Name: Matthew [name] MRN: [id number]  Date: 10/15/2018  DOB: 06/24/1950  Follow-Up New Visit Note - Conducted via WebEx due to current COVID-19 concerns for limiting patient exposure  CC: Matthew Cruel, MD  Matthew [name]*  Diagnosis:   68 y.o. 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    ICD-10-CM   1. Malignant neoplasm of prostate ([place]) C61   2. Prostate cancer [facility]) C61     Narrative:  The patient returns today for follow-up of his prostate cancer.  Since initial consultation, the patient underwent robotic assisted laparoscopic prostatectomy with bilateral pelvic lymph node dissection on 03/22/2018. Final pathology revealed Gleason 4+5 prostatic adenocarcinoma, tumor volume about 60%, with extraprostatic extension identified at the bilateral posterior and posterolateral from the apex to the base. Left seminal vesicle invasion was identified. Lymphovascular invasion was present. Margin of resection was focally positive at the right anterolateral apex (1 mm, Gleason 4+5) and right posterior peri seminal vesicle area (2 mm, Gleason 4+4). Metastatic prostatic adenocarcinoma was present in 1/3 right pelvic lymph nodes, measuring 7 mm with extra nodal extension. 3 left pelvic lymph nodes were benign.  Initial post-operative PSA on 05/03/2018 was 0.22. PSA lowered to 0.17 in February 2020, then rose to 0.35 earlier this month.  The patient reviewed PSA results with Dr. Lovena Neighbours and has kindly been referred today for discussion of salvage radiation treatment. Dr. Lovena Neighbours has discussed ADT with the patient, however the patient confirms he has not received this yet.                   On review of systems, the patient reports progressive improvement of his stress urinary incontinence, now  requiring only one pad per day. He is having occasional episodes of stress incontinence with laughing/coughing. He has urge incontinence especially when he goes several hours without voiding. He reports nocturia x1. He has not had interval UTIs, dysuria, or hematuria. 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  ALLERGIES:  is allergic to other and peanut-containing drug products.  Meds: Current Outpatient Medications  Medication Sig Dispense Refill  . cetirizine (ZYRTEC) 10 MG tablet Take 10 mg by mouth every morning.     . docusate sodium (COLACE) 100 MG capsule Take 100 mg by mouth 2 (two) times daily.    . fluticasone (FLONASE) 50 MCG/ACT nasal spray Place 1-2 sprays into both nostrils daily as needed for allergies or rhinitis.    Marland Kitchen ibuprofen (ADVIL,MOTRIN) 200 MG tablet Take 400 mg by mouth every 6 (six) hours as needed for headache or moderate pain.    Marland Kitchen lisinopril-hydrochlorothiazide (PRINZIDE,ZESTORETIC) 20-12.5 MG tablet Take 1 tablet by mouth every morning.     Marland Kitchen oxybutynin (DITROPAN) 5 MG tablet Take 1 tablet (5 mg total) by mouth every 8 (eight) hours as needed for bladder spasms. (Patient not taking: Reported on 10/15/2018) 30 tablet 2  . sildenafil (REVATIO) 20 MG tablet Take 20 mg by mouth as needed.     No current facility-administered medications for this encounter.     Physical Findings: The patient is in no acute distress. Patient is alert and oriented. Remainder of exam not performed in light of virtual visit platform.  vitals were not taken for this visit.   Lab Findings: Lab Results  Component Value Date   WBC  10.5 03/23/2018   HGB 11.1 (L) 03/23/2018   HCT 33.1 (L) 03/23/2018   PLT 134 (L) 03/23/2018    Lab Results  Component Value Date   NA 139 03/23/2018   K 3.9 03/23/2018   CO2 24 03/23/2018   GLUCOSE 118 (H) 03/23/2018   BUN 11 03/23/2018   CREATININE 0.94 03/23/2018   CALCIUM 8.2 (L) 03/23/2018   ANIONGAP 7 03/23/2018    Radiographic Findings: No results  found.  Impression:  68 y.o. male with detectable, rising PSA status post RALP with BPLND for treatment of pT3b, pN1, Gleason 4+5 adenocarcinoma of the prostate with adverse findings on pathology.  Today we reviewed the findings and workup thus far.  We discussed the natural history of high risk prostate cancer.  We reviewed the implications of positive margins, extracapsular extension, seminal vesicle involvement, and lymph node involvement, especially in light of a detectable, rising postop PSA, likely indicating residual disease. We reviewed some of the evidence suggesting an advantage for patients who undergo androgen deprivation therapy in combination with salvage radiotherapy in this setting in terms of disease control and overall survival. We discussed the recommended 7-1/2-week course of daily radiation treatment directed to the prostatic fossa and pelvic lymph nodes with regard to the logistics and delivery of external beam radiation treatment.   Plan: At the conclusion of our conversation, the patient elects to proceed with 7.5 weeks of salvage external beam radiation therapy in combination with short-term ADT. We will share our discussion with Dr. Lovena Neighbours and move forward with treatment planning and will make arrangements for an appointment this week to start ADT now, prior to Matthew [name]. He is scheduled for CT simulation on Friday May 29th at 2:30 PM, , in anticipation of beginning salvage fossa radiation in the near future.  The patient prefers to begin treatment as soon as possible in hopes of completing treatment prior to a scheduled family vacation the week of August 10th.   This encounter was provided by telemedicine platform Webex.  The patient has given verbal consent for this type of encounter and has been advised to only accept a meeting of this type in a secure network environment. The time spent during this encounter was 45 minutes. The attendants for this meeting include Tyler Pita MD, Freeman Caldron PA-C, scribe Clinton Sawyer, patient, Matthew [name] and his wife. During the encounter, Tyler Pita MD, Ashlyn Bruning PA-C, and scribe Clinton Sawyer were located at [facility] Radiation Oncology Department.  Patient, Matthew [name] and wife were located at home.    Nicholos Johns, PA-C    Tyler Pita, MD  [facility] Direct Dial: [phone]  Fax: [phone] [email or web address]  Skype  [email or web address]  This document serves as a record of services personally performed by Tyler Pita, MD and Freeman Caldron, PA-C. It was created on their behalf by Rae Lips, a trained medical scribe. The creation of this record is based on the scribe's personal observations and the providers' statements to them. This document has been checked and approved by the attending providers.

## 2018-10-16 ENCOUNTER — Encounter: Payer: Self-pay | Admitting: Urology

## 2018-10-16 NOTE — Progress Notes (Signed)
Per communication from Dr. Lovena Neighbours, the patient will start ADT 10/17/18 and is scheduled for [facility] Friday 10/18/18 in anticipation of beginning his salvage XRT around 10/28/18.  Nicholos Johns, MMS, PA-C [place] [facility] [place]: [phone]  Fax: [phone]

## 2018-10-17 DIAGNOSIS — Z5111 Encounter for antineoplastic chemotherapy: Secondary | ICD-10-CM | POA: Diagnosis not present

## 2018-10-17 DIAGNOSIS — C61 Malignant neoplasm of prostate: Secondary | ICD-10-CM | POA: Diagnosis not present

## 2018-10-18 ENCOUNTER — Ambulatory Visit
Admission: RE | Admit: 2018-10-18 | Discharge: 2018-10-18 | Disposition: A | Discharge status: Home / Self Care | Payer: PPO | Source: Ambulatory Visit | Attending: Radiation Oncology | Admitting: Radiation Oncology

## 2018-10-18 ENCOUNTER — Other Ambulatory Visit: Payer: Self-pay

## 2018-10-18 ENCOUNTER — Encounter: Payer: Self-pay | Admitting: Medical Oncology

## 2018-10-18 DIAGNOSIS — C61 Malignant neoplasm of prostate: Secondary | ICD-10-CM | POA: Insufficient documentation

## 2018-10-20 NOTE — Progress Notes (Signed)
  Radiation Oncology         [phone] ________________________________  Name: Matthew [name] MRN: [id number]  Date: 10/18/2018  DOB: 03-22-1951  SIMULATION AND TREATMENT PLANNING NOTE    ICD-10-CM   1. Prostate cancer [facility]) C61     DIAGNOSIS:  68 y.o. 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  Identity was confirmed.  All relevant records and images related to the planned course of therapy were reviewed.  The patient freely provided informed written consent to proceed with treatment after reviewing the details related to the planned course of therapy. The consent form was witnessed and verified by the simulation staff.  Then, the patient was set-up in a stable reproducible supine position for radiation therapy.  A vacuum lock pillow device was custom fabricated to position his legs in a reproducible immobilized position.  Then, I performed a urethrogram under sterile conditions to identify the prostatic bed.  CT images were obtained.  Surface markings were placed.  The CT images were loaded into the planning software.  Then the prostate bed target, pelvic lymph node target and avoidance structures including the rectum, bladder, bowel and hips were contoured.  Treatment planning then occurred.  The radiation prescription was entered and confirmed.  A total of one complex treatment devices were fabricated. I have requested : Intensity Modulated Radiotherapy (IMRT) is medically necessary for this case for the following reason:  Rectal sparing.Marland Kitchen  PLAN:  The patient will receive 45 Gy in 25 fractions of 1.8 Gy, followed by a boost to the prostate bed to a total dose of 68.4 Gy with 13 additional fractions of 1.8 Gy.   ________________________________  Sheral Apley Tammi Klippel, M.D.

## 2018-10-24 DIAGNOSIS — C61 Malignant neoplasm of prostate: Secondary | ICD-10-CM | POA: Diagnosis not present

## 2018-10-28 NOTE — Progress Notes (Signed)
I met Matthew [name] 02/2018 when he originally consulted with Dr. Manning. He decided on  robotic prostatectomy (03/22/2018) to treat his prostate cancer. He now presents with biochemical recurrence and will begin ADT and radiation to prostate fossa. I informed him I am working remotely so please leave me questions or concerns on my voicemail and I will return his call. 

## 2018-10-29 ENCOUNTER — Ambulatory Visit
Admission: RE | Admit: 2018-10-29 | Discharge: 2018-10-29 | Disposition: A | Discharge status: Home / Self Care | Payer: PPO | Source: Ambulatory Visit | Attending: Radiation Oncology | Admitting: Radiation Oncology

## 2018-10-29 DIAGNOSIS — C61 Malignant neoplasm of prostate: Secondary | ICD-10-CM | POA: Diagnosis not present

## 2018-10-30 ENCOUNTER — Ambulatory Visit
Admission: RE | Admit: 2018-10-30 | Discharge: 2018-10-30 | Disposition: A | Discharge status: Home / Self Care | Payer: PPO | Source: Ambulatory Visit | Attending: Radiation Oncology | Admitting: Radiation Oncology

## 2018-10-30 ENCOUNTER — Other Ambulatory Visit: Payer: Self-pay

## 2018-10-30 DIAGNOSIS — C61 Malignant neoplasm of prostate: Secondary | ICD-10-CM | POA: Diagnosis not present

## 2018-10-31 ENCOUNTER — Other Ambulatory Visit: Payer: Self-pay

## 2018-10-31 ENCOUNTER — Ambulatory Visit
Admission: RE | Admit: 2018-10-31 | Discharge: 2018-10-31 | Disposition: A | Discharge status: Home / Self Care | Payer: PPO | Source: Ambulatory Visit | Attending: Radiation Oncology | Admitting: Radiation Oncology

## 2018-10-31 DIAGNOSIS — C61 Malignant neoplasm of prostate: Secondary | ICD-10-CM | POA: Diagnosis not present

## 2018-11-01 ENCOUNTER — Ambulatory Visit
Admission: RE | Admit: 2018-11-01 | Discharge: 2018-11-01 | Disposition: A | Discharge status: Home / Self Care | Payer: PPO | Source: Ambulatory Visit | Attending: Radiation Oncology | Admitting: Radiation Oncology

## 2018-11-01 ENCOUNTER — Other Ambulatory Visit: Payer: Self-pay

## 2018-11-01 DIAGNOSIS — C61 Malignant neoplasm of prostate: Secondary | ICD-10-CM | POA: Diagnosis not present

## 2018-11-04 ENCOUNTER — Other Ambulatory Visit: Payer: Self-pay

## 2018-11-04 ENCOUNTER — Ambulatory Visit
Admission: RE | Admit: 2018-11-04 | Discharge: 2018-11-04 | Disposition: A | Discharge status: Home / Self Care | Payer: PPO | Source: Ambulatory Visit | Attending: Radiation Oncology | Admitting: Radiation Oncology

## 2018-11-04 DIAGNOSIS — C61 Malignant neoplasm of prostate: Secondary | ICD-10-CM | POA: Diagnosis not present

## 2018-11-05 ENCOUNTER — Other Ambulatory Visit: Payer: Self-pay

## 2018-11-05 ENCOUNTER — Ambulatory Visit
Admission: RE | Admit: 2018-11-05 | Discharge: 2018-11-05 | Disposition: A | Discharge status: Home / Self Care | Payer: PPO | Source: Ambulatory Visit | Attending: Radiation Oncology | Admitting: Radiation Oncology

## 2018-11-05 DIAGNOSIS — C61 Malignant neoplasm of prostate: Secondary | ICD-10-CM | POA: Diagnosis not present

## 2018-11-06 ENCOUNTER — Ambulatory Visit
Admission: RE | Admit: 2018-11-06 | Discharge: 2018-11-06 | Disposition: A | Discharge status: Home / Self Care | Payer: PPO | Source: Ambulatory Visit | Attending: Radiation Oncology | Admitting: Radiation Oncology

## 2018-11-06 ENCOUNTER — Other Ambulatory Visit: Payer: Self-pay

## 2018-11-06 DIAGNOSIS — C61 Malignant neoplasm of prostate: Secondary | ICD-10-CM | POA: Diagnosis not present

## 2018-11-07 ENCOUNTER — Other Ambulatory Visit: Payer: Self-pay

## 2018-11-07 ENCOUNTER — Ambulatory Visit
Admission: RE | Admit: 2018-11-07 | Discharge: 2018-11-07 | Disposition: A | Discharge status: Home / Self Care | Payer: PPO | Source: Ambulatory Visit | Attending: Radiation Oncology | Admitting: Radiation Oncology

## 2018-11-07 DIAGNOSIS — C61 Malignant neoplasm of prostate: Secondary | ICD-10-CM | POA: Diagnosis not present

## 2018-11-08 ENCOUNTER — Other Ambulatory Visit: Payer: Self-pay

## 2018-11-08 ENCOUNTER — Ambulatory Visit
Admission: RE | Admit: 2018-11-08 | Discharge: 2018-11-08 | Disposition: A | Discharge status: Home / Self Care | Payer: PPO | Source: Ambulatory Visit | Attending: Radiation Oncology | Admitting: Radiation Oncology

## 2018-11-08 DIAGNOSIS — C61 Malignant neoplasm of prostate: Secondary | ICD-10-CM | POA: Diagnosis not present

## 2018-11-11 ENCOUNTER — Other Ambulatory Visit: Payer: Self-pay

## 2018-11-11 ENCOUNTER — Ambulatory Visit
Admission: RE | Admit: 2018-11-11 | Discharge: 2018-11-11 | Disposition: A | Discharge status: Home / Self Care | Payer: PPO | Source: Ambulatory Visit | Attending: Radiation Oncology | Admitting: Radiation Oncology

## 2018-11-11 DIAGNOSIS — C61 Malignant neoplasm of prostate: Secondary | ICD-10-CM | POA: Diagnosis not present

## 2018-11-12 ENCOUNTER — Other Ambulatory Visit: Payer: Self-pay

## 2018-11-12 ENCOUNTER — Ambulatory Visit
Admission: RE | Admit: 2018-11-12 | Discharge: 2018-11-12 | Disposition: A | Discharge status: Home / Self Care | Payer: PPO | Source: Ambulatory Visit | Attending: Radiation Oncology | Admitting: Radiation Oncology

## 2018-11-12 DIAGNOSIS — C61 Malignant neoplasm of prostate: Secondary | ICD-10-CM | POA: Diagnosis not present

## 2018-11-13 ENCOUNTER — Ambulatory Visit
Admission: RE | Admit: 2018-11-13 | Discharge: 2018-11-13 | Disposition: A | Discharge status: Home / Self Care | Payer: PPO | Source: Ambulatory Visit | Attending: Radiation Oncology | Admitting: Radiation Oncology

## 2018-11-13 ENCOUNTER — Other Ambulatory Visit: Payer: Self-pay

## 2018-11-13 DIAGNOSIS — C61 Malignant neoplasm of prostate: Secondary | ICD-10-CM | POA: Diagnosis not present

## 2018-11-14 ENCOUNTER — Other Ambulatory Visit: Payer: Self-pay

## 2018-11-14 ENCOUNTER — Ambulatory Visit
Admission: RE | Admit: 2018-11-14 | Discharge: 2018-11-14 | Disposition: A | Discharge status: Home / Self Care | Payer: PPO | Source: Ambulatory Visit | Attending: Radiation Oncology | Admitting: Radiation Oncology

## 2018-11-14 DIAGNOSIS — C61 Malignant neoplasm of prostate: Secondary | ICD-10-CM | POA: Diagnosis not present

## 2018-11-15 ENCOUNTER — Other Ambulatory Visit: Payer: Self-pay

## 2018-11-15 ENCOUNTER — Ambulatory Visit
Admission: RE | Admit: 2018-11-15 | Discharge: 2018-11-15 | Disposition: A | Discharge status: Home / Self Care | Payer: PPO | Source: Ambulatory Visit | Attending: Radiation Oncology | Admitting: Radiation Oncology

## 2018-11-15 DIAGNOSIS — C61 Malignant neoplasm of prostate: Secondary | ICD-10-CM | POA: Diagnosis not present

## 2018-11-18 ENCOUNTER — Other Ambulatory Visit: Payer: Self-pay

## 2018-11-18 ENCOUNTER — Ambulatory Visit
Admission: RE | Admit: 2018-11-18 | Discharge: 2018-11-18 | Disposition: A | Discharge status: Home / Self Care | Payer: PPO | Source: Ambulatory Visit | Attending: Radiation Oncology | Admitting: Radiation Oncology

## 2018-11-18 DIAGNOSIS — C61 Malignant neoplasm of prostate: Secondary | ICD-10-CM | POA: Diagnosis not present

## 2018-11-19 ENCOUNTER — Ambulatory Visit
Admission: RE | Admit: 2018-11-19 | Discharge: 2018-11-19 | Disposition: A | Discharge status: Home / Self Care | Payer: PPO | Source: Ambulatory Visit | Attending: Radiation Oncology | Admitting: Radiation Oncology

## 2018-11-19 ENCOUNTER — Other Ambulatory Visit: Payer: Self-pay

## 2018-11-19 DIAGNOSIS — C61 Malignant neoplasm of prostate: Secondary | ICD-10-CM | POA: Diagnosis not present

## 2018-11-20 ENCOUNTER — Ambulatory Visit
Admission: RE | Admit: 2018-11-20 | Discharge: 2018-11-20 | Disposition: A | Discharge status: Home / Self Care | Payer: PPO | Source: Ambulatory Visit | Attending: Radiation Oncology | Admitting: Radiation Oncology

## 2018-11-20 ENCOUNTER — Other Ambulatory Visit: Payer: Self-pay

## 2018-11-20 DIAGNOSIS — C61 Malignant neoplasm of prostate: Secondary | ICD-10-CM | POA: Diagnosis not present

## 2018-11-21 ENCOUNTER — Other Ambulatory Visit: Payer: Self-pay

## 2018-11-21 ENCOUNTER — Ambulatory Visit
Admission: RE | Admit: 2018-11-21 | Discharge: 2018-11-21 | Disposition: A | Discharge status: Home / Self Care | Payer: PPO | Source: Ambulatory Visit | Attending: Radiation Oncology | Admitting: Radiation Oncology

## 2018-11-21 DIAGNOSIS — C61 Malignant neoplasm of prostate: Secondary | ICD-10-CM | POA: Diagnosis not present

## 2018-11-25 ENCOUNTER — Ambulatory Visit
Admission: RE | Admit: 2018-11-25 | Discharge: 2018-11-25 | Disposition: A | Discharge status: Home / Self Care | Payer: PPO | Source: Ambulatory Visit | Attending: Radiation Oncology | Admitting: Radiation Oncology

## 2018-11-25 ENCOUNTER — Other Ambulatory Visit: Payer: Self-pay

## 2018-11-25 DIAGNOSIS — C61 Malignant neoplasm of prostate: Secondary | ICD-10-CM | POA: Diagnosis not present

## 2018-11-26 ENCOUNTER — Ambulatory Visit
Admission: RE | Admit: 2018-11-26 | Discharge: 2018-11-26 | Disposition: A | Discharge status: Home / Self Care | Payer: PPO | Source: Ambulatory Visit | Attending: Radiation Oncology | Admitting: Radiation Oncology

## 2018-11-26 ENCOUNTER — Other Ambulatory Visit: Payer: Self-pay

## 2018-11-26 DIAGNOSIS — C61 Malignant neoplasm of prostate: Secondary | ICD-10-CM | POA: Diagnosis not present

## 2018-11-27 ENCOUNTER — Ambulatory Visit
Admission: RE | Admit: 2018-11-27 | Discharge: 2018-11-27 | Disposition: A | Discharge status: Home / Self Care | Payer: PPO | Source: Ambulatory Visit | Attending: Radiation Oncology | Admitting: Radiation Oncology

## 2018-11-27 ENCOUNTER — Other Ambulatory Visit: Payer: Self-pay

## 2018-11-27 DIAGNOSIS — C61 Malignant neoplasm of prostate: Secondary | ICD-10-CM | POA: Diagnosis not present

## 2018-11-28 ENCOUNTER — Other Ambulatory Visit: Payer: Self-pay

## 2018-11-28 ENCOUNTER — Ambulatory Visit
Admission: RE | Admit: 2018-11-28 | Discharge: 2018-11-28 | Disposition: A | Discharge status: Home / Self Care | Payer: PPO | Source: Ambulatory Visit | Attending: Radiation Oncology | Admitting: Radiation Oncology

## 2018-11-28 DIAGNOSIS — C61 Malignant neoplasm of prostate: Secondary | ICD-10-CM | POA: Diagnosis not present

## 2018-11-29 ENCOUNTER — Other Ambulatory Visit: Payer: Self-pay

## 2018-11-29 ENCOUNTER — Ambulatory Visit
Admission: RE | Admit: 2018-11-29 | Discharge: 2018-11-29 | Disposition: A | Discharge status: Home / Self Care | Payer: PPO | Source: Ambulatory Visit | Attending: Radiation Oncology | Admitting: Radiation Oncology

## 2018-11-29 DIAGNOSIS — C61 Malignant neoplasm of prostate: Secondary | ICD-10-CM | POA: Diagnosis not present

## 2018-12-02 ENCOUNTER — Other Ambulatory Visit: Payer: Self-pay

## 2018-12-02 ENCOUNTER — Ambulatory Visit
Admission: RE | Admit: 2018-12-02 | Discharge: 2018-12-02 | Disposition: A | Discharge status: Home / Self Care | Payer: PPO | Source: Ambulatory Visit | Attending: Radiation Oncology | Admitting: Radiation Oncology

## 2018-12-02 DIAGNOSIS — C61 Malignant neoplasm of prostate: Secondary | ICD-10-CM | POA: Diagnosis not present

## 2018-12-03 ENCOUNTER — Other Ambulatory Visit: Payer: Self-pay

## 2018-12-03 ENCOUNTER — Ambulatory Visit
Admission: RE | Admit: 2018-12-03 | Discharge: 2018-12-03 | Disposition: A | Discharge status: Home / Self Care | Payer: PPO | Source: Ambulatory Visit | Attending: Radiation Oncology | Admitting: Radiation Oncology

## 2018-12-03 DIAGNOSIS — C61 Malignant neoplasm of prostate: Secondary | ICD-10-CM | POA: Diagnosis not present

## 2018-12-04 ENCOUNTER — Ambulatory Visit
Admission: RE | Admit: 2018-12-04 | Discharge: 2018-12-04 | Disposition: A | Discharge status: Home / Self Care | Payer: PPO | Source: Ambulatory Visit | Attending: Radiation Oncology | Admitting: Radiation Oncology

## 2018-12-04 ENCOUNTER — Other Ambulatory Visit: Payer: Self-pay

## 2018-12-04 DIAGNOSIS — C61 Malignant neoplasm of prostate: Secondary | ICD-10-CM | POA: Diagnosis not present

## 2018-12-05 ENCOUNTER — Other Ambulatory Visit: Payer: Self-pay

## 2018-12-05 ENCOUNTER — Ambulatory Visit
Admission: RE | Admit: 2018-12-05 | Discharge: 2018-12-05 | Disposition: A | Discharge status: Home / Self Care | Payer: PPO | Source: Ambulatory Visit | Attending: Radiation Oncology | Admitting: Radiation Oncology

## 2018-12-05 DIAGNOSIS — C61 Malignant neoplasm of prostate: Secondary | ICD-10-CM | POA: Diagnosis not present

## 2018-12-06 ENCOUNTER — Other Ambulatory Visit: Payer: Self-pay

## 2018-12-06 ENCOUNTER — Ambulatory Visit
Admission: RE | Admit: 2018-12-06 | Discharge: 2018-12-06 | Disposition: A | Discharge status: Home / Self Care | Payer: PPO | Source: Ambulatory Visit | Attending: Radiation Oncology | Admitting: Radiation Oncology

## 2018-12-06 DIAGNOSIS — C61 Malignant neoplasm of prostate: Secondary | ICD-10-CM | POA: Diagnosis not present

## 2018-12-09 ENCOUNTER — Ambulatory Visit
Admission: RE | Admit: 2018-12-09 | Discharge: 2018-12-09 | Disposition: A | Discharge status: Home / Self Care | Payer: PPO | Source: Ambulatory Visit | Attending: Radiation Oncology | Admitting: Radiation Oncology

## 2018-12-09 ENCOUNTER — Other Ambulatory Visit: Payer: Self-pay

## 2018-12-09 DIAGNOSIS — C61 Malignant neoplasm of prostate: Secondary | ICD-10-CM | POA: Diagnosis not present

## 2018-12-10 ENCOUNTER — Other Ambulatory Visit: Payer: Self-pay

## 2018-12-10 ENCOUNTER — Ambulatory Visit
Admission: RE | Admit: 2018-12-10 | Discharge: 2018-12-10 | Disposition: A | Discharge status: Home / Self Care | Payer: PPO | Source: Ambulatory Visit | Attending: Radiation Oncology | Admitting: Radiation Oncology

## 2018-12-10 DIAGNOSIS — C61 Malignant neoplasm of prostate: Secondary | ICD-10-CM | POA: Diagnosis not present

## 2018-12-11 ENCOUNTER — Ambulatory Visit
Admission: RE | Admit: 2018-12-11 | Discharge: 2018-12-11 | Disposition: A | Discharge status: Home / Self Care | Payer: PPO | Source: Ambulatory Visit | Attending: Radiation Oncology | Admitting: Radiation Oncology

## 2018-12-11 ENCOUNTER — Other Ambulatory Visit: Payer: Self-pay

## 2018-12-11 DIAGNOSIS — C61 Malignant neoplasm of prostate: Secondary | ICD-10-CM | POA: Diagnosis not present

## 2018-12-12 ENCOUNTER — Other Ambulatory Visit: Payer: Self-pay

## 2018-12-12 ENCOUNTER — Ambulatory Visit
Admission: RE | Admit: 2018-12-12 | Discharge: 2018-12-12 | Disposition: A | Discharge status: Home / Self Care | Payer: PPO | Source: Ambulatory Visit | Attending: Radiation Oncology | Admitting: Radiation Oncology

## 2018-12-12 DIAGNOSIS — C61 Malignant neoplasm of prostate: Secondary | ICD-10-CM | POA: Diagnosis not present

## 2018-12-13 ENCOUNTER — Other Ambulatory Visit: Payer: Self-pay

## 2018-12-13 ENCOUNTER — Ambulatory Visit
Admission: RE | Admit: 2018-12-13 | Discharge: 2018-12-13 | Disposition: A | Discharge status: Home / Self Care | Payer: PPO | Source: Ambulatory Visit | Attending: Radiation Oncology | Admitting: Radiation Oncology

## 2018-12-13 DIAGNOSIS — C61 Malignant neoplasm of prostate: Secondary | ICD-10-CM | POA: Diagnosis not present

## 2018-12-16 ENCOUNTER — Ambulatory Visit
Admission: RE | Admit: 2018-12-16 | Discharge: 2018-12-16 | Disposition: A | Discharge status: Home / Self Care | Payer: PPO | Source: Ambulatory Visit | Attending: Radiation Oncology | Admitting: Radiation Oncology

## 2018-12-16 ENCOUNTER — Other Ambulatory Visit: Payer: Self-pay

## 2018-12-16 DIAGNOSIS — C61 Malignant neoplasm of prostate: Secondary | ICD-10-CM | POA: Diagnosis not present

## 2018-12-17 ENCOUNTER — Ambulatory Visit
Admission: RE | Admit: 2018-12-17 | Discharge: 2018-12-17 | Disposition: A | Discharge status: Home / Self Care | Payer: PPO | Source: Ambulatory Visit | Attending: Radiation Oncology | Admitting: Radiation Oncology

## 2018-12-17 ENCOUNTER — Other Ambulatory Visit: Payer: Self-pay

## 2018-12-17 DIAGNOSIS — C61 Malignant neoplasm of prostate: Secondary | ICD-10-CM | POA: Diagnosis not present

## 2018-12-18 ENCOUNTER — Ambulatory Visit
Admission: RE | Admit: 2018-12-18 | Discharge: 2018-12-18 | Disposition: A | Discharge status: Home / Self Care | Payer: PPO | Source: Ambulatory Visit | Attending: Radiation Oncology | Admitting: Radiation Oncology

## 2018-12-18 ENCOUNTER — Other Ambulatory Visit: Payer: Self-pay

## 2018-12-18 DIAGNOSIS — C61 Malignant neoplasm of prostate: Secondary | ICD-10-CM | POA: Diagnosis not present

## 2018-12-19 ENCOUNTER — Ambulatory Visit
Admission: RE | Admit: 2018-12-19 | Discharge: 2018-12-19 | Disposition: A | Discharge status: Home / Self Care | Payer: PPO | Source: Ambulatory Visit | Attending: Radiation Oncology | Admitting: Radiation Oncology

## 2018-12-19 ENCOUNTER — Other Ambulatory Visit: Payer: Self-pay

## 2018-12-19 DIAGNOSIS — C61 Malignant neoplasm of prostate: Secondary | ICD-10-CM | POA: Diagnosis not present

## 2018-12-20 ENCOUNTER — Encounter: Payer: Self-pay | Admitting: Radiation Oncology

## 2018-12-20 ENCOUNTER — Other Ambulatory Visit: Payer: Self-pay

## 2018-12-20 ENCOUNTER — Ambulatory Visit
Admission: RE | Admit: 2018-12-20 | Discharge: 2018-12-20 | Disposition: A | Discharge status: Home / Self Care | Payer: PPO | Source: Ambulatory Visit | Attending: Radiation Oncology | Admitting: Radiation Oncology

## 2018-12-20 DIAGNOSIS — C61 Malignant neoplasm of prostate: Secondary | ICD-10-CM | POA: Diagnosis not present

## 2018-12-26 DIAGNOSIS — C61 Malignant neoplasm of prostate: Secondary | ICD-10-CM | POA: Diagnosis not present

## 2019-01-12 NOTE — Progress Notes (Signed)
  Radiation Oncology         [phone] ________________________________  Name: Matthew [name] MRN: [id number]  Date: 12/20/2018  DOB: [birth year]  End of Treatment Note  Diagnosis:   68 y.o. 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 The prostate fossa and pelvic lymph nodes were initially treated to 45 Gy in 25 fractions of 1.8 Gy  2. The prostate fossa only was boosted to 68.4 Gy with 13 additional fractions of 1.8 Gy   Beams/energy:  1. The prostate fossa  and pelvic lymph nodes were initially treated using VMAT intensity modulated radiotherapy delivering 6 megavolt photons. Image guidance was performed with CB-CT studies prior to each fraction. He was immobilized with a body fix lower extremity mold.  2. The prostate fossa only was boosted using VMAT intensity modulated radiotherapy delivering 6 megavolt photons. Image guidance was performed with CB-CT studies prior to each fraction. He was immobilized with a body fix lower extremity mold.  Narrative: The patient tolerated radiation treatment relatively well.   The patient experienced some minor urinary irritation and modest fatigue.    Plan: The patient has completed radiation treatment. He will return to radiation oncology clinic for routine followup in one month. I advised him to call or return sooner if he has any questions or concerns related to his recovery or treatment. ________________________________  Sheral Apley. Tammi Klippel, M.D.

## 2019-01-14 DIAGNOSIS — C61 Malignant neoplasm of prostate: Secondary | ICD-10-CM | POA: Diagnosis not present

## 2019-01-14 DIAGNOSIS — N3941 Urge incontinence: Secondary | ICD-10-CM | POA: Diagnosis not present

## 2019-01-20 ENCOUNTER — Telehealth: Payer: Self-pay | Admitting: Radiation Oncology

## 2019-01-20 NOTE — Telephone Encounter (Signed)
Received voicemail message from patient requesting return call. Phoned patient back promptly. Confirmed appointment for 2 pm on Wednesday via telephone. Patient request Matthew Caldron, PA-C phone his home number listed in the appointment notes.

## 2019-01-22 ENCOUNTER — Other Ambulatory Visit: Payer: Self-pay

## 2019-01-22 ENCOUNTER — Ambulatory Visit
Admission: RE | Admit: 2019-01-22 | Discharge: 2019-01-22 | Disposition: A | Discharge status: Home / Self Care | Payer: PPO | Source: Ambulatory Visit | Attending: Urology | Admitting: Urology

## 2019-01-22 DIAGNOSIS — C61 Malignant neoplasm of prostate: Secondary | ICD-10-CM

## 2019-01-22 NOTE — Progress Notes (Signed)
Radiation Oncology         [phone]) [phone] ________________________________  Name: Matthew [name] MRN: [id number]  Date: 01/22/2019  DOB: 21-Mar-1951  Post Treatment Note  CC: Lawerance Cruel, MD  Davis Gourd*  Diagnosis:   68 y.o. 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 The prostate fossa and pelvic lymph nodes were initially treated to 45 Gy in 25 fractions of 1.8 Gy  2. The prostate fossa only was boosted to 68.4 Gy with 13 additional fractions of 1.8 Gy   Narrative:  I spoke with the patient to conduct his routine scheduled 1 month follow up visit via telephone to spare the patient unnecessary potential exposure in the healthcare setting during the current COVID-19 pandemic.  The patient was notified in advance and gave permission to proceed with this visit format.  He tolerated radiation treatment relatively well with only minor urinary irritation and modest fatigue.  He was able to continue ADT with Lupron throughout his radiation treatments and tolerated this well overall.                           On review of systems, the patient states that he is doing very well overall.  He continues with increased frequency, urgency and nocturia 3-4 times per night but feels that he is emptying his bladder well on voiding.  He specifically denies dysuria, gross hematuria, fever, chills, night sweats, straining to void or incontinence.  He reports a healthy appetite and is maintaining his weight.  He denies abdominal pain, nausea, vomiting, diarrhea or constipation.  He continues to tolerate the Lupron ADT well despite hot flashes and moderate fatigue.  He had a recent follow-up visit with Dr. Lovena Neighbours last week and his PSA was reportedly undetectable at that time which he is quite pleased with.  ALLERGIES:  is allergic to other and  peanut-containing drug products.  Meds: Current Outpatient Medications  Medication Sig Dispense Refill  . cetirizine (ZYRTEC) 10 MG tablet Take 10 mg by mouth every morning.     . docusate sodium (COLACE) 100 MG capsule Take 100 mg by mouth 2 (two) times daily.    . fluticasone (FLONASE) 50 MCG/ACT nasal spray Place 1-2 sprays into both nostrils daily as needed for allergies or rhinitis.    Marland Kitchen ibuprofen (ADVIL,MOTRIN) 200 MG tablet Take 400 mg by mouth every 6 (six) hours as needed for headache or moderate pain.    Marland Kitchen lisinopril-hydrochlorothiazide (PRINZIDE,ZESTORETIC) 20-12.5 MG tablet Take 1 tablet by mouth every morning.     Marland Kitchen oxybutynin (DITROPAN) 5 MG tablet Take 1 tablet (5 mg total) by mouth every 8 (eight) hours as needed for bladder spasms. (Patient not taking: Reported on 10/15/2018) 30 tablet 2  . sildenafil (REVATIO) 20 MG tablet Take 20 mg by mouth as needed.     No current facility-administered medications for this encounter.     Physical Findings:  vitals were not taken for this visit.   /Unable to assess due to telephone follow-up visit format.  Lab Findings: Lab Results  Component Value Date   WBC 10.5 03/23/2018   HGB 11.1 (L) 03/23/2018   HCT 33.1 (L) 03/23/2018   MCV 92.5 03/23/2018   PLT 134 (L) 03/23/2018     Radiographic Findings: No results found.  Impression/Plan: 67. 68 y.o. 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 Lovena Neighbours last week where his PSA was noted to be undetectable.  He intends to continue Lupron ADT for a total of 2 years of therapy under the care and direction of Dr. Lovena Neighbours.  He understands what to expect with regards to PSA monitoring going forward. I will look forward to following his response to treatment via correspondence with  urology, and would be happy to continue to participate in his care if clinically indicated. I talked to the patient about what to expect in the future, including his risk for erectile dysfunction and rectal bleeding. I encouraged him to call or return to the office if he has any questions regarding his previous radiation or possible radiation side effects. He was comfortable with this plan and will follow up as needed.    Nicholos Johns, PA-C

## 2019-02-11 DIAGNOSIS — M2060 Acquired deformities of toe(s), unspecified, unspecified foot: Secondary | ICD-10-CM | POA: Diagnosis not present

## 2019-02-11 DIAGNOSIS — S80211A Abrasion, right knee, initial encounter: Secondary | ICD-10-CM | POA: Diagnosis not present

## 2019-02-11 DIAGNOSIS — M79672 Pain in left foot: Secondary | ICD-10-CM | POA: Diagnosis not present

## 2019-03-10 DIAGNOSIS — R278 Other lack of coordination: Secondary | ICD-10-CM | POA: Diagnosis not present

## 2019-03-10 DIAGNOSIS — M25561 Pain in right knee: Secondary | ICD-10-CM | POA: Diagnosis not present

## 2019-03-10 DIAGNOSIS — M549 Dorsalgia, unspecified: Secondary | ICD-10-CM | POA: Diagnosis not present

## 2019-03-18 DIAGNOSIS — M545 Low back pain: Secondary | ICD-10-CM | POA: Diagnosis not present

## 2019-03-18 DIAGNOSIS — M25561 Pain in right knee: Secondary | ICD-10-CM | POA: Diagnosis not present

## 2019-03-20 DIAGNOSIS — S29012D Strain of muscle and tendon of back wall of thorax, subsequent encounter: Secondary | ICD-10-CM | POA: Diagnosis not present

## 2019-03-20 DIAGNOSIS — M1711 Unilateral primary osteoarthritis, right knee: Secondary | ICD-10-CM | POA: Diagnosis not present

## 2019-03-21 DIAGNOSIS — Z23 Encounter for immunization: Secondary | ICD-10-CM | POA: Diagnosis not present

## 2019-03-25 DIAGNOSIS — M1711 Unilateral primary osteoarthritis, right knee: Secondary | ICD-10-CM | POA: Diagnosis not present

## 2019-03-25 DIAGNOSIS — S29012D Strain of muscle and tendon of back wall of thorax, subsequent encounter: Secondary | ICD-10-CM | POA: Diagnosis not present

## 2019-03-27 DIAGNOSIS — M1711 Unilateral primary osteoarthritis, right knee: Secondary | ICD-10-CM | POA: Diagnosis not present

## 2019-03-27 DIAGNOSIS — S29012D Strain of muscle and tendon of back wall of thorax, subsequent encounter: Secondary | ICD-10-CM | POA: Diagnosis not present

## 2019-04-01 DIAGNOSIS — M1711 Unilateral primary osteoarthritis, right knee: Secondary | ICD-10-CM | POA: Diagnosis not present

## 2019-04-01 DIAGNOSIS — M25561 Pain in right knee: Secondary | ICD-10-CM | POA: Diagnosis not present

## 2019-04-01 DIAGNOSIS — S29012D Strain of muscle and tendon of back wall of thorax, subsequent encounter: Secondary | ICD-10-CM | POA: Diagnosis not present

## 2019-04-03 DIAGNOSIS — M1711 Unilateral primary osteoarthritis, right knee: Secondary | ICD-10-CM | POA: Diagnosis not present

## 2019-04-03 DIAGNOSIS — S29012D Strain of muscle and tendon of back wall of thorax, subsequent encounter: Secondary | ICD-10-CM | POA: Diagnosis not present

## 2019-04-08 DIAGNOSIS — S29012D Strain of muscle and tendon of back wall of thorax, subsequent encounter: Secondary | ICD-10-CM | POA: Diagnosis not present

## 2019-04-08 DIAGNOSIS — M1711 Unilateral primary osteoarthritis, right knee: Secondary | ICD-10-CM | POA: Diagnosis not present

## 2019-04-09 DIAGNOSIS — C61 Malignant neoplasm of prostate: Secondary | ICD-10-CM | POA: Diagnosis not present

## 2019-04-10 ENCOUNTER — Other Ambulatory Visit: Payer: Self-pay | Admitting: Orthopedic Surgery

## 2019-04-10 DIAGNOSIS — M1711 Unilateral primary osteoarthritis, right knee: Secondary | ICD-10-CM | POA: Diagnosis not present

## 2019-04-10 DIAGNOSIS — S29012D Strain of muscle and tendon of back wall of thorax, subsequent encounter: Secondary | ICD-10-CM | POA: Diagnosis not present

## 2019-04-11 DIAGNOSIS — Z20828 Contact with and (suspected) exposure to other viral communicable diseases: Secondary | ICD-10-CM | POA: Diagnosis not present

## 2019-04-13 DIAGNOSIS — Z20828 Contact with and (suspected) exposure to other viral communicable diseases: Secondary | ICD-10-CM | POA: Diagnosis not present

## 2019-04-14 ENCOUNTER — Encounter: Payer: Self-pay | Admitting: *Deleted

## 2019-04-15 DIAGNOSIS — J069 Acute upper respiratory infection, unspecified: Secondary | ICD-10-CM | POA: Diagnosis not present

## 2019-04-15 DIAGNOSIS — N342 Other urethritis: Secondary | ICD-10-CM | POA: Diagnosis not present

## 2019-04-30 NOTE — Patient Instructions (Signed)
DUE TO COVID-19 ONLY ONE VISITOR IS ALLOWED TO COME WITH YOU AND STAY IN THE WAITING ROOM ONLY DURING PRE OP AND PROCEDURE DAY OF SURGERY. THE 1 VISITOR MAY VISIT WITH YOU AFTER SURGERY IN YOUR PRIVATE ROOM DURING VISITING HOURS ONLY!  YOU NEED TO HAVE A COVID 19 TEST ON___12-10____ @_______ , THIS TEST MUST BE DONE BEFORE SURGERY, COME  [address], [address] , [id number].  ([place]) ONCE YOUR COVID TEST IS COMPLETED, PLEASE BEGIN THE QUARANTINE INSTRUCTIONS AS OUTLINED IN YOUR HANDOUT.                Maryan Puls   Your procedure is scheduled on: 12-14   Report to [facility]  Entrance   Report to [place] at 5:30AM     Call this number if you have problems the morning of surgery [phone]    NO SOLID FOOD AFTER MIDNIGHT THE NIGHT PRIOR TO SURGERY. YOU MAY DRINK CLEAR LIQUIDS.   STOP CLEAR LIQUIDS AT ____4:15am_____ AND THEN DRINK THE ENSURE PRE-SURGERY [place]. NOTHING BY MOUTH AFTER THE ENSURE DRINK! BRUSH YOUR TEETH MORNING OF SURGERY AND RINSE YOUR MOUTH OUT, NO CHEWING GUM CANDY OR MINTS.     Take these medicines the morning of surgery with A SIP OF WATER:  Zyrtec, Flonase if needed                                 You may not have any metal on your body including hair pins and              piercings  Do not wear jewelry, make-up, lotions, powders or perfumes, deodorant                       Men may shave face and neck.   Do not bring valuables to the hospital. [place].  Contacts, dentures or bridgework may not be worn into surgery.  YOU MAY BRING A SMALL OVERNIGHT BAG              Please read over the following fact sheets you were given: _____________________________________________________________________             [facility] - Preparing for Surgery Before surgery, you can play an important role.  Because skin is not sterile, your skin needs to be as free of germs as  possible.  You can reduce the number of germs on your skin by washing with CHG (chlorahexidine gluconate) soap before surgery.  CHG is an antiseptic cleaner which kills germs and bonds with the skin to continue killing germs even after washing. Please DO NOT use if you have an allergy to CHG or antibacterial soaps.  If your skin becomes reddened/irritated stop using the CHG and inform your nurse when you arrive at Short Stay. Do not shave (including legs and underarms) for at least 48 hours prior to the first CHG shower.  You may shave your face/neck. Please follow these instructions carefully:  1.  Shower with CHG Soap the night before surgery and the  morning of Surgery.  2.  If you choose to wash your hair, wash your hair first as usual with your  normal  shampoo.  3.  After you shampoo, rinse your hair and body thoroughly to remove  the  shampoo.                           4.  Use CHG as you would any other liquid soap.  You can apply chg directly  to the skin and wash                       Gently with a scrungie or clean washcloth.  5.  Apply the CHG Soap to your body ONLY FROM THE NECK DOWN.   Do not use on face/ open                           Wound or open sores. Avoid contact with eyes, ears mouth and genitals (private parts).                       Wash face,  Genitals (private parts) with your normal soap.             6.  Wash thoroughly, paying special attention to the area where your surgery  will be performed.  7.  Thoroughly rinse your body with warm water from the neck down.  8.  DO NOT shower/wash with your normal soap after using and rinsing off  the CHG Soap.                9.  Pat yourself dry with a clean towel.            10.  Wear clean pajamas.            11.  Place clean sheets on your bed the night of your first shower and do not  sleep with pets. Day of Surgery : Do not apply any lotions/deodorants the morning of surgery.  Please wear clean clothes to the hospital/surgery  center.  FAILURE TO FOLLOW THESE INSTRUCTIONS MAY RESULT IN THE CANCELLATION OF YOUR SURGERY PATIENT SIGNATURE_________________________________  NURSE SIGNATURE__________________________________  ________________________________________________________________________   Adam Phenix  An incentive spirometer is a tool that can help keep your lungs clear and active. This tool measures how well you are filling your lungs with each breath. Taking long deep breaths may help reverse or decrease the chance of developing breathing (pulmonary) problems (especially infection) following:  A long period of time when you are unable to move or be active. BEFORE THE PROCEDURE   If the spirometer includes an indicator to show your best effort, your nurse or respiratory therapist will set it to a desired goal.  If possible, sit up straight or lean slightly forward. Try not to slouch.  Hold the incentive spirometer in an upright position. INSTRUCTIONS FOR USE  1. Sit on the edge of your bed if possible, or sit up as far as you can in bed or on a chair. 2. Hold the incentive spirometer in an upright position. 3. Breathe out normally. 4. Place the mouthpiece in your mouth and seal your lips tightly around it. 5. Breathe in slowly and as deeply as possible, raising the piston or the ball toward the top of the column. 6. Hold your breath for 3-5 seconds or for as long as possible. Allow the piston or ball to fall to the bottom of the column. 7. Remove the mouthpiece from your mouth and breathe out normally. 8. Rest for a few seconds and repeat Steps 1 through 7 at least 10  times every 1-2 hours when you are awake. Take your time and take a few normal breaths between deep breaths. 9. The spirometer may include an indicator to show your best effort. Use the indicator as a goal to work toward during each repetition. 10. After each set of 10 deep breaths, practice coughing to be sure your lungs are  clear. If you have an incision (the cut made at the time of surgery), support your incision when coughing by placing a pillow or rolled up towels firmly against it. Once you are able to get out of bed, walk around indoors and cough well. You may stop using the incentive spirometer when instructed by your caregiver.  RISKS AND COMPLICATIONS  Take your time so you do not get dizzy or light-headed.  If you are in pain, you may need to take or ask for pain medication before doing incentive spirometry. It is harder to take a deep breath if you are having pain. AFTER USE  Rest and breathe slowly and easily.  It can be helpful to keep track of a log of your progress. Your caregiver can provide you with a simple table to help with this. If you are using the spirometer at home, follow these instructions: [place] IF:   You are having difficultly using the spirometer.  You have trouble using the spirometer as often as instructed.  Your pain medication is not giving enough relief while using the spirometer.  You develop fever of 100.5 F (38.1 C) or higher. SEEK IMMEDIATE MEDICAL CARE IF:   You cough up bloody sputum that had not been present before.  You develop fever of 102 F (38.9 C) or greater.  You develop worsening pain at or near the incision site. MAKE SURE YOU:   Understand these instructions.  Will watch your condition.  Will get help right away if you are not doing well or get worse. Document Released: 09/18/2006 Document Revised: 07/31/2011 Document Reviewed: 11/19/2006 [facility] Patient Information 2014 [place], [place].   ________________________________________________________________________

## 2019-05-01 ENCOUNTER — Other Ambulatory Visit: Payer: Self-pay

## 2019-05-01 ENCOUNTER — Other Ambulatory Visit (HOSPITAL_COMMUNITY)
Admission: RE | Admit: 2019-05-01 | Discharge: 2019-05-01 | Disposition: A | Discharge status: Home / Self Care | Payer: PPO | Source: Ambulatory Visit

## 2019-05-01 ENCOUNTER — Encounter (HOSPITAL_COMMUNITY)
Admission: RE | Admit: 2019-05-01 | Discharge: 2019-05-01 | Disposition: A | Discharge status: Home / Self Care | Payer: PPO | Source: Ambulatory Visit | Attending: Orthopedic Surgery | Admitting: Orthopedic Surgery

## 2019-05-01 ENCOUNTER — Ambulatory Visit: Payer: PPO | Admitting: Neurology

## 2019-05-01 ENCOUNTER — Encounter (HOSPITAL_COMMUNITY): Payer: Self-pay

## 2019-05-01 ENCOUNTER — Ambulatory Visit (HOSPITAL_COMMUNITY)
Admission: RE | Admit: 2019-05-01 | Discharge: 2019-05-01 | Disposition: A | Discharge status: Home / Self Care | Payer: PPO | Source: Ambulatory Visit | Attending: Orthopedic Surgery | Admitting: Orthopedic Surgery

## 2019-05-01 DIAGNOSIS — Z01818 Encounter for other preprocedural examination: Secondary | ICD-10-CM

## 2019-05-01 DIAGNOSIS — Z20828 Contact with and (suspected) exposure to other viral communicable diseases: Secondary | ICD-10-CM | POA: Insufficient documentation

## 2019-05-01 DIAGNOSIS — M1711 Unilateral primary osteoarthritis, right knee: Secondary | ICD-10-CM | POA: Insufficient documentation

## 2019-05-01 DIAGNOSIS — I700 Atherosclerosis of aorta: Secondary | ICD-10-CM | POA: Diagnosis not present

## 2019-05-01 HISTORY — DX: Anxiety disorder, unspecified: F41.9

## 2019-05-01 LAB — BASIC METABOLIC PANEL
Anion gap: 8 (ref 5–15)
BUN: 28 mg/dL — ABNORMAL HIGH (ref 8–23)
CO2: 27 mmol/L (ref 22–32)
Calcium: 9.3 mg/dL (ref 8.9–10.3)
Chloride: 104 mmol/L (ref 98–111)
Creatinine, Ser: 1.05 mg/dL (ref 0.61–1.24)
GFR calc Af Amer: >60 mL/min (ref >60)
GFR calc non Af Amer: >60 mL/min (ref >60)
Glucose, Bld: 106 mg/dL — ABNORMAL HIGH (ref 70–99)
Potassium: 4.4 mmol/L (ref 3.5–5.1)
Sodium: 139 mmol/L (ref 135–145)

## 2019-05-01 LAB — URINALYSIS, ROUTINE W REFLEX MICROSCOPIC
Bilirubin Urine: NEGATIVE
Glucose, UA: NEGATIVE mg/dL
Hgb urine dipstick: NEGATIVE
Ketones, ur: NEGATIVE mg/dL
Leukocytes,Ua: NEGATIVE
Nitrite: NEGATIVE
Protein, ur: NEGATIVE mg/dL
Specific Gravity, Urine: 1.02 (ref 1.005–1.030)
pH: 5 (ref 5.0–8.0)

## 2019-05-01 LAB — CBC WITH DIFFERENTIAL/PLATELET
Abs Immature Granulocytes: 0.03 10*3/uL (ref 0.00–0.07)
Basophils Absolute: 0 10*3/uL (ref 0.0–0.1)
Basophils Relative: 0 %
Eosinophils Absolute: 0.3 10*3/uL (ref 0.0–0.5)
Eosinophils Relative: 6 %
HCT: 38.7 % — ABNORMAL LOW (ref 39.0–52.0)
Hemoglobin: 13.2 g/dL (ref 13.0–17.0)
Immature Granulocytes: 1 %
Lymphocytes Relative: 9 %
Lymphs Abs: 0.4 10*3/uL — ABNORMAL LOW (ref 0.7–4.0)
MCH: 32.2 pg (ref 26.0–34.0)
MCHC: 34.1 g/dL (ref 30.0–36.0)
MCV: 94.4 fL (ref 80.0–100.0)
Monocytes Absolute: 0.4 10*3/uL (ref 0.1–1.0)
Monocytes Relative: 9 %
Neutro Abs: 3.5 10*3/uL (ref 1.7–7.7)
Neutrophils Relative %: 75 %
Platelets: 131 10*3/uL — ABNORMAL LOW (ref 150–400)
RBC: 4.1 MIL/uL — ABNORMAL LOW (ref 4.22–5.81)
RDW: 11.7 % (ref 11.5–15.5)
WBC: 4.8 10*3/uL (ref 4.0–10.5)
nRBC: 0 % (ref 0.0–0.2)

## 2019-05-01 LAB — SURGICAL PCR SCREEN
MRSA, PCR: NEGATIVE
Staphylococcus aureus: NEGATIVE

## 2019-05-01 LAB — PROTIME-INR
INR: 0.9 (ref 0.8–1.2)
Prothrombin Time: 12.3 seconds (ref 11.4–15.2)

## 2019-05-01 LAB — APTT: aPTT: 29 seconds (ref 24–36)

## 2019-05-01 NOTE — Progress Notes (Signed)
PCP - Lawerance Cruel, MD Cardiologist -   Chest x-ray - 12-10 epic  EKG - 12-10 epic  Stress Test -  ECHO -  Cardiac Cath -   Sleep Study -  CPAP -   Fasting Blood Sugar -  Checks Blood Sugar _____ times a day  Blood Thinner Instructions: Aspirin Instructions: Last Dose:  Anesthesia review:   cbcdiff routed via epic to dr Mayer Camel   Patient denies shortness of breath, fever, cough and chest pain at PAT appointment   Patient verbalized understanding of instructions that were given to them at the PAT appointment. Patient was also instructed that they will need to review over the PAT instructions again at home before surgery.

## 2019-05-02 DIAGNOSIS — M1711 Unilateral primary osteoarthritis, right knee: Secondary | ICD-10-CM | POA: Diagnosis present

## 2019-05-02 LAB — NOVEL CORONAVIRUS, NAA (HOSP ORDER, SEND-OUT TO REF LAB; TAT 18-24 HRS): SARS-CoV-2, NAA: NOT DETECTED

## 2019-05-02 NOTE — Care Plan (Signed)
Ortho Bundle Case Management Note  Patient Details  Name: Matthew [name] MRN: [id number] Date of Birth: 1950/10/03  Patient is planning to discharge to home with family and HHPT. Referral to [facility]. He has all needed equipment at home. He will go to OPPT at [facility]. Patient and MD in agreement with plan. CHoice offered.                  DME Arranged:    DME Agency:     HH Arranged:  PT HH Agency:  [facility]  Additional Comments: Please contact me with any questions of if this plan should need to change.  Ladell Heads,  [place] Orthopaedic Specialist  [phone] 05/02/2019, 4:01 PM

## 2019-05-02 NOTE — H&P (Signed)
TOTAL KNEE ADMISSION H&P  Patient is being admitted for right total knee arthroplasty.  Subjective:  Chief Complaint:right knee pain.  HPI: Matthew [name], 68 y.o. male, has a history of pain and functional disability in the right knee due to arthritis and has failed non-surgical conservative treatments for greater than 12 weeks to includeNSAID's and/or analgesics, corticosteriod injections, flexibility and strengthening excercises, weight reduction as appropriate and activity modification.  Onset of symptoms was gradual, starting 3 years ago with gradually worsening course since that time. The patient noted no past surgery on the right knee(s).  Patient currently rates pain in the right knee(s) at 10 out of 10 with activity. Patient has night pain, worsening of pain with activity and weight bearing, pain that interferes with activities of daily living, pain with passive range of motion, crepitus and joint swelling.  Patient has evidence of subchondral sclerosis and joint space narrowing by imaging studies.   There is no active infection.  Patient Active Problem List   Diagnosis Date Noted  . Prostate cancer ([place]) 03/22/2018  . Malignant neoplasm of prostate ([place]) 02/27/2018   Past Medical History:  Diagnosis Date  . Anxiety   . Arthritis   . GERD (gastroesophageal reflux disease)    occ remote history  . Hypertension   . Prostate cancer ([place])   . Right knee pain    questionable meniscal tear  . Seasonal allergies     Past Surgical History:  Procedure Laterality Date  . COLONOSCOPY    . Leg Laceration Repair  Left    Age 54  . PELVIC LYMPH NODE DISSECTION Bilateral 03/22/2018   Procedure: PELVIC LYMPH NODE DISSECTION;  Surgeon: Ceasar Mons, MD;  Location: [facility];  Service: Urology;  Laterality: Bilateral;  . PROSTATE BIOPSY    . ROBOT ASSISTED LAPAROSCOPIC RADICAL PROSTATECTOMY N/A 03/22/2018   Procedure: XI ROBOTIC ASSISTED LAPAROSCOPIC PROSTATECTOMY;  Surgeon:  Ceasar Mons, MD;  Location: [facility];  Service: Urology;  Laterality: N/A;    No current facility-administered medications for this encounter.   Current Outpatient Medications  Medication Sig Dispense Refill Last Dose  . cetirizine (ZYRTEC) 10 MG tablet Take 10 mg by mouth every morning.      . fluticasone (FLONASE) 50 MCG/ACT nasal spray Place 1-2 sprays into both nostrils daily as needed for allergies or rhinitis.     Marland Kitchen ibuprofen (ADVIL) 800 MG tablet Take 800 mg by mouth every 8 (eight) hours as needed for headache or moderate pain.      Marland Kitchen lisinopril (ZESTRIL) 20 MG tablet Take 20 mg by mouth daily.     Marland Kitchen oxybutynin (DITROPAN) 5 MG tablet Take 1 tablet (5 mg total) by mouth every 8 (eight) hours as needed for bladder spasms. (Patient not taking: Reported on 10/15/2018) 30 tablet 2    Allergies  Allergen Reactions  . Other     Senior dose of flu shot, cause hear palpitations, anxious  . Peanut-Containing Drug Products Other (See Comments)    Nasal congestion    Social History   Tobacco Use  . Smoking status: Current Every Day Smoker    Years: 20.00    Types: Pipe  . Smokeless tobacco: Never Used  Substance Use Topics  . Alcohol use: Yes    Alcohol/week: 1.0 - 2.0 standard drinks    Types: 1 - 2 Shots of liquor per week    Comment: per day    Family History  Problem Relation Age of Onset  . Breast  cancer Mother        73s  . Heart attack Father 50  . Colon cancer Maternal Grandmother   . Prostate cancer Neg Hx   . Pancreatic cancer Neg Hx      Review of Systems  Constitutional: Positive for fatigue.  HENT: Positive for sinus pain.   Eyes: Negative.   Respiratory: Negative.   Cardiovascular:       HTN  Gastrointestinal: Negative.   Endocrine: Negative.   Genitourinary:       Poor bladder control and hx of prostate cancer  Musculoskeletal: Positive for joint swelling.  Skin: Negative.   Allergic/Immunologic: Negative.   Neurological: Positive for  weakness.    Objective:  Physical Exam  Constitutional: He is oriented to person, place, and time. He appears well-developed and well-nourished.  HENT:  Head: Normocephalic and atraumatic.  Eyes: Pupils are equal, round, and reactive to light.  Cardiovascular: Intact distal pulses.  Respiratory: Effort normal.  Musculoskeletal:        General: Tenderness present.     Cervical back: Normal range of motion and neck supple.     Comments: Examination of the right knee shows no swelling or erythema.  There is mild medial joint line tenderness.  He has full strength and limited AROM.  He is neurovascularly intact distally.  Neurological: He is alert and oriented to person, place, and time.  Skin: Skin is warm and dry.  Psychiatric: He has a normal mood and affect. His behavior is normal. Judgment and thought content normal.    Vital signs in last 24 hours:    Labs:   Estimated body mass index is 26.15 kg/m as calculated from the following:   Height as of 05/01/19: 5\' 6"  (1.676 m).   Weight as of 05/01/19: 73.5 kg.   Imaging Review Plain radiographs demonstrate 4 views of the right knee with end-stage osteoarthritis with sclerotic changes seen at the patella.       Assessment/Plan:  End stage arthritis, right knee   The patient history, physical examination, clinical judgment of the provider and imaging studies are consistent with end stage degenerative joint disease of the right knee(s) and total knee arthroplasty is deemed medically necessary. The treatment options including medical management, injection therapy arthroscopy and arthroplasty were discussed at length. The risks and benefits of total knee arthroplasty were presented and reviewed. The risks due to aseptic loosening, infection, stiffness, patella tracking problems, thromboembolic complications and other imponderables were discussed. The patient acknowledged the explanation, agreed to proceed with the plan and  consent was signed. Patient is being admitted for inpatient treatment for surgery, pain control, PT, OT, prophylactic antibiotics, VTE prophylaxis, progressive ambulation and ADL's and discharge planning. The patient is planning to be discharged home with home health services     Patient's anticipated LOS is less than 2 midnights, meeting these requirements: - Younger than 29 - Lives within 1 hour of care - Has a competent adult at home to recover with post-op recover - NO history of  - Chronic pain requiring opiods  - Diabetes  - Coronary Artery Disease  - Heart failure  - Heart attack  - Stroke  - DVT/VTE  - Cardiac arrhythmia  - Respiratory Failure/COPD  - Renal failure  - Anemia  - Advanced Liver disease

## 2019-05-04 ENCOUNTER — Encounter (HOSPITAL_COMMUNITY): Payer: Self-pay | Admitting: Orthopedic Surgery

## 2019-05-04 MED ORDER — BUPIVACAINE LIPOSOME 1.3 % IJ SUSP
20.0000 mL | Freq: Once | INTRAMUSCULAR | Status: DC
  Filled 2019-05-04: qty 20

## 2019-05-04 MED ORDER — TRANEXAMIC ACID 1000 MG/10ML IV SOLN
2000.0000 mg | INTRAVENOUS | Status: DC
  Filled 2019-05-04: qty 20

## 2019-05-05 ENCOUNTER — Ambulatory Visit (HOSPITAL_COMMUNITY): Payer: PPO | Admitting: Physician Assistant

## 2019-05-05 ENCOUNTER — Encounter (HOSPITAL_COMMUNITY)
Admission: RE | Disposition: A | Discharge status: Home / Self Care | Payer: Self-pay | Source: Home / Self Care | Attending: Orthopedic Surgery

## 2019-05-05 ENCOUNTER — Ambulatory Visit (HOSPITAL_COMMUNITY)
Admission: RE | Admit: 2019-05-05 | Discharge: 2019-05-06 | Disposition: A | Discharge status: Home / Self Care | Payer: PPO | Attending: Orthopedic Surgery | Admitting: Orthopedic Surgery

## 2019-05-05 ENCOUNTER — Other Ambulatory Visit: Payer: Self-pay

## 2019-05-05 ENCOUNTER — Ambulatory Visit (HOSPITAL_COMMUNITY): Payer: PPO | Admitting: Certified Registered Nurse Anesthetist

## 2019-05-05 ENCOUNTER — Encounter (HOSPITAL_COMMUNITY): Payer: Self-pay | Admitting: Orthopedic Surgery

## 2019-05-05 DIAGNOSIS — Z79899 Other long term (current) drug therapy: Secondary | ICD-10-CM | POA: Insufficient documentation

## 2019-05-05 DIAGNOSIS — Z803 Family history of malignant neoplasm of breast: Secondary | ICD-10-CM | POA: Diagnosis not present

## 2019-05-05 DIAGNOSIS — Z8249 Family history of ischemic heart disease and other diseases of the circulatory system: Secondary | ICD-10-CM | POA: Diagnosis not present

## 2019-05-05 DIAGNOSIS — Z91010 Allergy to peanuts: Secondary | ICD-10-CM | POA: Insufficient documentation

## 2019-05-05 DIAGNOSIS — F17200 Nicotine dependence, unspecified, uncomplicated: Secondary | ICD-10-CM | POA: Insufficient documentation

## 2019-05-05 DIAGNOSIS — K219 Gastro-esophageal reflux disease without esophagitis: Secondary | ICD-10-CM | POA: Insufficient documentation

## 2019-05-05 DIAGNOSIS — M1711 Unilateral primary osteoarthritis, right knee: Secondary | ICD-10-CM | POA: Diagnosis present

## 2019-05-05 DIAGNOSIS — F419 Anxiety disorder, unspecified: Secondary | ICD-10-CM | POA: Diagnosis not present

## 2019-05-05 DIAGNOSIS — M1990 Unspecified osteoarthritis, unspecified site: Secondary | ICD-10-CM | POA: Diagnosis not present

## 2019-05-05 DIAGNOSIS — Z96651 Presence of right artificial knee joint: Secondary | ICD-10-CM

## 2019-05-05 DIAGNOSIS — Z800 Family history of malignant neoplasm of digestive organs: Secondary | ICD-10-CM | POA: Diagnosis not present

## 2019-05-05 DIAGNOSIS — I700 Atherosclerosis of aorta: Secondary | ICD-10-CM | POA: Diagnosis not present

## 2019-05-05 DIAGNOSIS — Z8546 Personal history of malignant neoplasm of prostate: Secondary | ICD-10-CM | POA: Diagnosis not present

## 2019-05-05 DIAGNOSIS — Z791 Long term (current) use of non-steroidal anti-inflammatories (NSAID): Secondary | ICD-10-CM | POA: Diagnosis not present

## 2019-05-05 DIAGNOSIS — G8918 Other acute postprocedural pain: Secondary | ICD-10-CM | POA: Diagnosis not present

## 2019-05-05 DIAGNOSIS — I10 Essential (primary) hypertension: Secondary | ICD-10-CM | POA: Diagnosis not present

## 2019-05-05 DIAGNOSIS — Z7982 Long term (current) use of aspirin: Secondary | ICD-10-CM | POA: Insufficient documentation

## 2019-05-05 DIAGNOSIS — Z887 Allergy status to serum and vaccine status: Secondary | ICD-10-CM | POA: Insufficient documentation

## 2019-05-05 HISTORY — PX: TOTAL KNEE ARTHROPLASTY: SHX125

## 2019-05-05 LAB — TYPE AND SCREEN
ABO/RH(D): O POS
Antibody Screen: NEGATIVE

## 2019-05-05 SURGERY — ARTHROPLASTY, KNEE, TOTAL
Anesthesia: Regional | Site: Knee | Laterality: Right

## 2019-05-05 MED ORDER — GABAPENTIN 100 MG PO CAPS
100.0000 mg | ORAL_CAPSULE | Freq: Three times a day (TID) | ORAL | Status: DC
  Administered 2019-05-05 – 2019-05-06 (×3): 100 mg via ORAL
  Filled 2019-05-05 (×3): qty 1

## 2019-05-05 MED ORDER — ACETAMINOPHEN 500 MG PO TABS: 1000.0000 mg | ORAL_TABLET | Freq: Once | ORAL | Status: DC | PRN

## 2019-05-05 MED ORDER — SODIUM CHLORIDE 0.9 % IV BOLUS: 250.0000 mL | Freq: Once | INTRAVENOUS | Status: DC

## 2019-05-05 MED ORDER — BISACODYL 5 MG PO TBEC: 5.0000 mg | DELAYED_RELEASE_TABLET | Freq: Every day | ORAL | Status: DC | PRN

## 2019-05-05 MED ORDER — CELECOXIB 200 MG PO CAPS: 200.0000 mg | ORAL_CAPSULE | Freq: Two times a day (BID) | ORAL | 2 refills | Status: DC

## 2019-05-05 MED ORDER — POVIDONE-IODINE 10 % EX SWAB
2.0000 "application " | Freq: Once | CUTANEOUS | Status: AC
  Administered 2019-05-05: 2 via TOPICAL

## 2019-05-05 MED ORDER — ASPIRIN EC 81 MG PO TBEC: 81.0000 mg | DELAYED_RELEASE_TABLET | Freq: Two times a day (BID) | ORAL | 0 refills | Status: DC

## 2019-05-05 MED ORDER — KETAMINE HCL 10 MG/ML IJ SOLN
INTRAMUSCULAR | Status: AC
  Filled 2019-05-05: qty 1

## 2019-05-05 MED ORDER — PROPOFOL 10 MG/ML IV BOLUS
INTRAVENOUS | Status: DC | PRN
  Administered 2019-05-05: 125 ug/kg/min via INTRAVENOUS
  Administered 2019-05-05: 150 mg via INTRAVENOUS

## 2019-05-05 MED ORDER — TRANEXAMIC ACID-NACL 1000-0.7 MG/100ML-% IV SOLN
1000.0000 mg | INTRAVENOUS | Status: AC
  Administered 2019-05-05: 1000 mg via INTRAVENOUS
  Filled 2019-05-05: qty 100

## 2019-05-05 MED ORDER — OXYCODONE HCL 5 MG PO TABS
5.0000 mg | ORAL_TABLET | ORAL | Status: DC | PRN
  Administered 2019-05-05 – 2019-05-06 (×3): 5 mg via ORAL
  Filled 2019-05-05 (×3): qty 1

## 2019-05-05 MED ORDER — DEXAMETHASONE SODIUM PHOSPHATE 10 MG/ML IJ SOLN
INTRAMUSCULAR | Status: AC
  Filled 2019-05-05: qty 1

## 2019-05-05 MED ORDER — POLYETHYLENE GLYCOL 3350 17 G PO PACK: 17.0000 g | PACK | Freq: Every day | ORAL | Status: DC | PRN

## 2019-05-05 MED ORDER — EPHEDRINE 5 MG/ML INJ
INTRAVENOUS | Status: AC
  Filled 2019-05-05: qty 10

## 2019-05-05 MED ORDER — LIDOCAINE 2% (20 MG/ML) 5 ML SYRINGE
INTRAMUSCULAR | Status: DC | PRN
  Administered 2019-05-05: 100 mg via INTRAVENOUS

## 2019-05-05 MED ORDER — METHOCARBAMOL 500 MG IVPB - SIMPLE MED
500.0000 mg | Freq: Four times a day (QID) | INTRAVENOUS | Status: DC | PRN
  Filled 2019-05-05: qty 50

## 2019-05-05 MED ORDER — MIDAZOLAM HCL 2 MG/2ML IJ SOLN
INTRAMUSCULAR | Status: AC
  Filled 2019-05-05: qty 2

## 2019-05-05 MED ORDER — PROPOFOL 500 MG/50ML IV EMUL
INTRAVENOUS | Status: AC
  Filled 2019-05-05: qty 50

## 2019-05-05 MED ORDER — TRANEXAMIC ACID-NACL 1000-0.7 MG/100ML-% IV SOLN
1000.0000 mg | Freq: Once | INTRAVENOUS | Status: AC
  Administered 2019-05-05: 1000 mg via INTRAVENOUS
  Filled 2019-05-05 (×2): qty 100

## 2019-05-05 MED ORDER — ONDANSETRON HCL 4 MG/2ML IJ SOLN: 4.0000 mg | Freq: Four times a day (QID) | INTRAMUSCULAR | Status: DC | PRN

## 2019-05-05 MED ORDER — METHOCARBAMOL 500 MG IVPB - SIMPLE MED
INTRAVENOUS | Status: AC
  Administered 2019-05-05: 500 mg via INTRAVENOUS
  Filled 2019-05-05: qty 50

## 2019-05-05 MED ORDER — ASPIRIN 81 MG PO CHEW
81.0000 mg | CHEWABLE_TABLET | Freq: Two times a day (BID) | ORAL | Status: DC
  Administered 2019-05-05 – 2019-05-06 (×2): 81 mg via ORAL
  Filled 2019-05-05 (×2): qty 1

## 2019-05-05 MED ORDER — HYDROMORPHONE HCL 2 MG/ML IJ SOLN
INTRAMUSCULAR | Status: AC
  Filled 2019-05-05: qty 1

## 2019-05-05 MED ORDER — ROPIVACAINE HCL 7.5 MG/ML IJ SOLN
INTRAMUSCULAR | Status: DC | PRN
  Administered 2019-05-05: 20 mL via PERINEURAL

## 2019-05-05 MED ORDER — ACETAMINOPHEN 10 MG/ML IV SOLN
INTRAVENOUS | Status: AC
  Filled 2019-05-05: qty 100

## 2019-05-05 MED ORDER — MIDAZOLAM HCL 5 MG/5ML IJ SOLN
INTRAMUSCULAR | Status: DC | PRN
  Administered 2019-05-05: 1 mg via INTRAVENOUS

## 2019-05-05 MED ORDER — SODIUM CHLORIDE (PF) 0.9 % IJ SOLN
INTRAMUSCULAR | Status: AC
  Filled 2019-05-05: qty 20

## 2019-05-05 MED ORDER — LIDOCAINE-EPINEPHRINE 2 %-1:100000 IJ SOLN
INTRAMUSCULAR | Status: DC | PRN
  Administered 2019-05-05: 10 mL via PERINEURAL

## 2019-05-05 MED ORDER — CEFAZOLIN SODIUM-DEXTROSE 2-4 GM/100ML-% IV SOLN
2.0000 g | INTRAVENOUS | Status: AC
  Administered 2019-05-05: 2 g via INTRAVENOUS
  Filled 2019-05-05: qty 100

## 2019-05-05 MED ORDER — CELECOXIB 200 MG PO CAPS
200.0000 mg | ORAL_CAPSULE | Freq: Two times a day (BID) | ORAL | Status: DC
  Administered 2019-05-05 – 2019-05-06 (×2): 200 mg via ORAL
  Filled 2019-05-05 (×2): qty 1

## 2019-05-05 MED ORDER — BUPIVACAINE HCL (PF) 0.25 % IJ SOLN
INTRAMUSCULAR | Status: AC
  Filled 2019-05-05: qty 30

## 2019-05-05 MED ORDER — KCL IN DEXTROSE-NACL 20-5-0.45 MEQ/L-%-% IV SOLN
INTRAVENOUS | Status: DC
  Administered 2019-05-05: 17:00:00 via INTRAVENOUS
  Filled 2019-05-05 (×2): qty 1000

## 2019-05-05 MED ORDER — CHLORHEXIDINE GLUCONATE 4 % EX LIQD: 60.0000 mL | Freq: Once | CUTANEOUS | Status: DC

## 2019-05-05 MED ORDER — DIPHENHYDRAMINE HCL 12.5 MG/5ML PO ELIX: 12.5000 mg | ORAL_SOLUTION | ORAL | Status: DC | PRN

## 2019-05-05 MED ORDER — BUPIVACAINE LIPOSOME 1.3 % IJ SUSP
INTRAMUSCULAR | Status: DC | PRN
  Administered 2019-05-05: 20 mL

## 2019-05-05 MED ORDER — LIDOCAINE 2% (20 MG/ML) 5 ML SYRINGE
INTRAMUSCULAR | Status: AC
  Filled 2019-05-05: qty 5

## 2019-05-05 MED ORDER — BUPIVACAINE-EPINEPHRINE (PF) 0.25% -1:200000 IJ SOLN
INTRAMUSCULAR | Status: DC | PRN
  Administered 2019-05-05: 30 mL

## 2019-05-05 MED ORDER — PHENYLEPHRINE 40 MCG/ML (10ML) SYRINGE FOR IV PUSH (FOR BLOOD PRESSURE SUPPORT)
PREFILLED_SYRINGE | INTRAVENOUS | Status: DC | PRN
  Administered 2019-05-05: 80 ug via INTRAVENOUS

## 2019-05-05 MED ORDER — OXYCODONE-ACETAMINOPHEN 5-325 MG PO TABS: 1.0000 | ORAL_TABLET | ORAL | 0 refills | Status: DC | PRN

## 2019-05-05 MED ORDER — EPHEDRINE SULFATE-NACL 50-0.9 MG/10ML-% IV SOSY
PREFILLED_SYRINGE | INTRAVENOUS | Status: DC | PRN
  Administered 2019-05-05 (×2): 7.5 mg via INTRAVENOUS

## 2019-05-05 MED ORDER — FENTANYL CITRATE (PF) 100 MCG/2ML IJ SOLN
INTRAMUSCULAR | Status: AC
  Filled 2019-05-05: qty 2

## 2019-05-05 MED ORDER — MENTHOL 3 MG MT LOZG: 1.0000 | LOZENGE | OROMUCOSAL | Status: DC | PRN

## 2019-05-05 MED ORDER — ONDANSETRON HCL 4 MG PO TABS: 4.0000 mg | ORAL_TABLET | Freq: Four times a day (QID) | ORAL | Status: DC | PRN

## 2019-05-05 MED ORDER — SODIUM CHLORIDE (PF) 0.9 % IJ SOLN
INTRAMUSCULAR | Status: AC
  Filled 2019-05-05: qty 50

## 2019-05-05 MED ORDER — FENTANYL CITRATE (PF) 100 MCG/2ML IJ SOLN
INTRAMUSCULAR | Status: DC | PRN
  Administered 2019-05-05 (×2): 50 ug via INTRAVENOUS

## 2019-05-05 MED ORDER — SODIUM CHLORIDE 0.9 % IR SOLN
Status: DC | PRN
  Administered 2019-05-05: 1000 mL

## 2019-05-05 MED ORDER — PANTOPRAZOLE SODIUM 40 MG PO TBEC
40.0000 mg | DELAYED_RELEASE_TABLET | Freq: Every day | ORAL | Status: DC
  Administered 2019-05-06: 40 mg via ORAL
  Filled 2019-05-05 (×2): qty 1

## 2019-05-05 MED ORDER — PROPOFOL 10 MG/ML IV BOLUS
INTRAVENOUS | Status: AC
  Filled 2019-05-05: qty 20

## 2019-05-05 MED ORDER — TIZANIDINE HCL 2 MG PO TABS: 2.0000 mg | ORAL_TABLET | Freq: Four times a day (QID) | ORAL | 0 refills | Status: DC | PRN

## 2019-05-05 MED ORDER — SODIUM CHLORIDE 0.9 % IV SOLN
INTRAVENOUS | Status: DC
  Administered 2019-05-05: 500 mL via INTRAVENOUS

## 2019-05-05 MED ORDER — HYDROMORPHONE HCL 1 MG/ML IJ SOLN: 0.5000 mg | INTRAMUSCULAR | Status: DC | PRN

## 2019-05-05 MED ORDER — ACETAMINOPHEN 10 MG/ML IV SOLN: 1000.0000 mg | Freq: Once | INTRAVENOUS | Status: DC | PRN

## 2019-05-05 MED ORDER — LORATADINE 10 MG PO TABS
10.0000 mg | ORAL_TABLET | Freq: Every day | ORAL | Status: DC
  Filled 2019-05-05 (×2): qty 1

## 2019-05-05 MED ORDER — FLEET ENEMA 7-19 GM/118ML RE ENEM: 1.0000 | ENEMA | Freq: Once | RECTAL | Status: DC | PRN

## 2019-05-05 MED ORDER — DOCUSATE SODIUM 100 MG PO CAPS
100.0000 mg | ORAL_CAPSULE | Freq: Two times a day (BID) | ORAL | Status: DC
  Administered 2019-05-05 – 2019-05-06 (×2): 100 mg via ORAL
  Filled 2019-05-05 (×3): qty 1

## 2019-05-05 MED ORDER — TRANEXAMIC ACID 1000 MG/10ML IV SOLN
INTRAVENOUS | Status: DC | PRN
  Administered 2019-05-05: 2000 mg via TOPICAL

## 2019-05-05 MED ORDER — ALUM & MAG HYDROXIDE-SIMETH 200-200-20 MG/5ML PO SUSP: 30.0000 mL | ORAL | Status: DC | PRN

## 2019-05-05 MED ORDER — ACETAMINOPHEN 325 MG PO TABS: 325.0000 mg | ORAL_TABLET | Freq: Four times a day (QID) | ORAL | Status: DC | PRN

## 2019-05-05 MED ORDER — FENTANYL CITRATE (PF) 100 MCG/2ML IJ SOLN: 25.0000 ug | INTRAMUSCULAR | Status: DC | PRN

## 2019-05-05 MED ORDER — OXYCODONE HCL 5 MG/5ML PO SOLN: 5.0000 mg | Freq: Once | ORAL | Status: DC | PRN

## 2019-05-05 MED ORDER — SODIUM CHLORIDE (PF) 0.9 % IJ SOLN
INTRAMUSCULAR | Status: DC | PRN
  Administered 2019-05-05: 70 mL via INTRAVENOUS

## 2019-05-05 MED ORDER — METOCLOPRAMIDE HCL 5 MG PO TABS: 5.0000 mg | ORAL_TABLET | Freq: Three times a day (TID) | ORAL | Status: DC | PRN

## 2019-05-05 MED ORDER — SODIUM CHLORIDE 0.9 % IV BOLUS: 500.0000 mL | Freq: Once | INTRAVENOUS | Status: DC

## 2019-05-05 MED ORDER — KETAMINE HCL 10 MG/ML IJ SOLN
INTRAMUSCULAR | Status: DC | PRN
  Administered 2019-05-05: 30 mg via INTRAVENOUS

## 2019-05-05 MED ORDER — ACETAMINOPHEN 160 MG/5ML PO SOLN: 1000.0000 mg | Freq: Once | ORAL | Status: DC | PRN

## 2019-05-05 MED ORDER — PHENOL 1.4 % MT LIQD: 1.0000 | OROMUCOSAL | Status: DC | PRN

## 2019-05-05 MED ORDER — LISINOPRIL 20 MG PO TABS
20.0000 mg | ORAL_TABLET | Freq: Every day | ORAL | Status: DC
  Administered 2019-05-05: 20 mg via ORAL
  Filled 2019-05-05 (×2): qty 1

## 2019-05-05 MED ORDER — FLUTICASONE PROPIONATE 50 MCG/ACT NA SUSP: 1.0000 | Freq: Every day | NASAL | Status: DC | PRN

## 2019-05-05 MED ORDER — METHOCARBAMOL 500 MG PO TABS
500.0000 mg | ORAL_TABLET | Freq: Four times a day (QID) | ORAL | Status: DC | PRN
  Administered 2019-05-05 – 2019-05-06 (×2): 500 mg via ORAL
  Filled 2019-05-05 (×2): qty 1

## 2019-05-05 MED ORDER — PHENYLEPHRINE 40 MCG/ML (10ML) SYRINGE FOR IV PUSH (FOR BLOOD PRESSURE SUPPORT)
PREFILLED_SYRINGE | INTRAVENOUS | Status: AC
  Filled 2019-05-05: qty 10

## 2019-05-05 MED ORDER — HYDROMORPHONE HCL 1 MG/ML IJ SOLN
INTRAMUSCULAR | Status: DC | PRN
  Administered 2019-05-05: 1 mg via INTRAVENOUS

## 2019-05-05 MED ORDER — OXYCODONE HCL 5 MG PO TABS: 5.0000 mg | ORAL_TABLET | Freq: Once | ORAL | Status: DC | PRN

## 2019-05-05 MED ORDER — ACETAMINOPHEN 10 MG/ML IV SOLN
INTRAVENOUS | Status: DC | PRN
  Administered 2019-05-05: 1000 mg via INTRAVENOUS

## 2019-05-05 MED ORDER — LACTATED RINGERS IV SOLN
INTRAVENOUS | Status: DC
  Administered 2019-05-05: 06:00:00 via INTRAVENOUS

## 2019-05-05 MED ORDER — METOCLOPRAMIDE HCL 5 MG/ML IJ SOLN: 5.0000 mg | Freq: Three times a day (TID) | INTRAMUSCULAR | Status: DC | PRN

## 2019-05-05 MED ORDER — ONDANSETRON HCL 4 MG/2ML IJ SOLN
INTRAMUSCULAR | Status: AC
  Filled 2019-05-05: qty 2

## 2019-05-05 MED ORDER — DEXAMETHASONE SODIUM PHOSPHATE 10 MG/ML IJ SOLN
INTRAMUSCULAR | Status: DC | PRN
  Administered 2019-05-05: 10 mg via INTRAVENOUS

## 2019-05-05 SURGICAL SUPPLY — 50 items
ATTUNE MED DOME PAT 41 KNEE (Knees) ×1 IMPLANT
ATTUNE PS FEM RT SZ 7 CEM KNEE (Femur) ×1 IMPLANT
ATTUNE PSRP INSR SZ7 5 KNEE (Insert) ×1 IMPLANT
BAG DECANTER FOR FLEXI CONT (MISCELLANEOUS) ×2 IMPLANT
BAG SPEC THK2 15X12 ZIP CLS (MISCELLANEOUS) ×1
BAG ZIPLOCK 12X15 (MISCELLANEOUS) ×2 IMPLANT
BASE TIBIAL ROT PLAT SZ 7 KNEE (Knees) IMPLANT
BLADE SAG 18X100X1.27 (BLADE) ×2 IMPLANT
BLADE SAW SGTL 11.0X1.19X90.0M (BLADE) ×2 IMPLANT
BLADE SURG SZ10 CARB STEEL (BLADE) ×4 IMPLANT
BNDG CMPR MED 10X6 ELC LF (GAUZE/BANDAGES/DRESSINGS) ×1
BNDG ELASTIC 6X10 VLCR STRL LF (GAUZE/BANDAGES/DRESSINGS) ×2 IMPLANT
BOWL SMART MIX CTS (DISPOSABLE) ×3 IMPLANT
BSPLAT TIB 7 CMNT ROT PLAT STR (Knees) ×1 IMPLANT
CEMENT HV SMART SET (Cement) ×4 IMPLANT
COVER SURGICAL LIGHT HANDLE (MISCELLANEOUS) ×2 IMPLANT
COVER WAND RF STERILE (DRAPES) IMPLANT
CUFF TOURN SGL QUICK 34 (TOURNIQUET CUFF) ×2
CUFF TRNQT CYL 34X4.125X (TOURNIQUET CUFF) ×1 IMPLANT
DECANTER SPIKE VIAL GLASS SM (MISCELLANEOUS) ×6 IMPLANT
DRAPE U-SHAPE 47X51 STRL (DRAPES) ×2 IMPLANT
DRSG AQUACEL AG ADV 3.5X10 (GAUZE/BANDAGES/DRESSINGS) ×2 IMPLANT
DURAPREP 26ML APPLICATOR (WOUND CARE) ×2 IMPLANT
ELECT REM PT RETURN 15FT ADLT (MISCELLANEOUS) ×2 IMPLANT
GLOVE BIO SURGEON STRL SZ7.5 (GLOVE) ×2 IMPLANT
GLOVE BIO SURGEON STRL SZ8.5 (GLOVE) ×2 IMPLANT
GLOVE BIOGEL PI IND STRL 8 (GLOVE) ×1 IMPLANT
GLOVE BIOGEL PI IND STRL 9 (GLOVE) ×1 IMPLANT
GLOVE BIOGEL PI INDICATOR 8 (GLOVE) ×1
GLOVE BIOGEL PI INDICATOR 9 (GLOVE) ×1
GOWN STRL REUS W/TWL XL LVL3 (GOWN DISPOSABLE) ×4 IMPLANT
HANDPIECE INTERPULSE COAX TIP (DISPOSABLE) ×2
HOOD PEEL AWAY FLYTE STAYCOOL (MISCELLANEOUS) ×6 IMPLANT
KIT TURNOVER KIT A (KITS) IMPLANT
NEEDLE HYPO 21X1.5 SAFETY (NEEDLE) ×4 IMPLANT
NS IRRIG 1000ML POUR BTL (IV SOLUTION) ×2 IMPLANT
PACK ICE MAXI GEL EZY WRAP (MISCELLANEOUS) ×2 IMPLANT
PACK TOTAL KNEE CUSTOM (KITS) ×2 IMPLANT
PENCIL SMOKE EVACUATOR (MISCELLANEOUS) IMPLANT
PIN STEINMAN FIXATION KNEE (PIN) ×1 IMPLANT
PROTECTOR NERVE ULNAR (MISCELLANEOUS) ×2 IMPLANT
SET HNDPC FAN SPRY TIP SCT (DISPOSABLE) ×1 IMPLANT
SUT VIC AB 1 CTX 36 (SUTURE) ×2
SUT VIC AB 1 CTX36XBRD ANBCTR (SUTURE) ×1 IMPLANT
SUT VIC AB 3-0 CT1 27 (SUTURE) ×6
SUT VIC AB 3-0 CT1 TAPERPNT 27 (SUTURE) ×3 IMPLANT
SYR CONTROL 10ML LL (SYRINGE) ×4 IMPLANT
TIBIAL BASE ROT PLAT SZ 7 KNEE (Knees) ×2 IMPLANT
WATER STERILE IRR 1000ML POUR (IV SOLUTION) ×3 IMPLANT
YANKAUER SUCT BULB TIP 10FT TU (MISCELLANEOUS) ×2 IMPLANT

## 2019-05-05 NOTE — Interval H&P Note (Signed)
History and Physical Interval Note:  05/05/2019 7:14 AM  Matthew [name]  has presented today for surgery, with the diagnosis of RIGHT KNEE OSTHEOARTHRITIS.  The various methods of treatment have been discussed with the patient and family. After consideration of risks, benefits and other options for treatment, the patient has consented to  Procedure(s): RIGHT TOTAL KNEE ARTHROPLASTY (Right) as a surgical intervention.  The patient's history has been reviewed, patient examined, no change in status, stable for surgery.  I have reviewed the patient's chart and labs.  Questions were answered to the patient's satisfaction.     Kerin Salen

## 2019-05-05 NOTE — Op Note (Signed)
PATIENT ID:      Matthew [name]  MRN:     [id number] DOB/AGE:    26-Feb-1951 / 68 y.o.       OPERATIVE REPORT   DATE OF PROCEDURE:  05/05/2019      PREOPERATIVE DIAGNOSIS:   RIGHT KNEE OSTHEOARTHRITIS      Estimated body mass index is 26.15 kg/m as calculated from the following:   Height as of 05/01/19: 5\' 6"  (1.676 m).   Weight as of 05/01/19: 73.5 kg.                                                       POSTOPERATIVE DIAGNOSIS:   RIGHT KNEE OSTHEOARTHRITIS                                                                       PROCEDURE:  Procedure(s): RIGHT TOTAL KNEE ARTHROPLASTY Using DepuyAttune RP implants #7R Femur, #7Tibia, 5 mm Attune RP bearing, 41 Patella    SURGEON: Kerin Salen  ASSISTANT:   Kerry Hough. [employer]   (Present and scrubbed throughout the case, critical for assistance with exposure, retraction, instrumentation, and closure.)        ANESTHESIA: GET, 20cc Exparel, 50cc 0.25% Marcaine EBL: 400 cc FLUID REPLACEMENT: 1600 cc crystaloid TOURNIQUET: DRAINS: None TRANEXAMIC ACID: 1gm IV, 2gm topical COMPLICATIONS:  None         INDICATIONS FOR PROCEDURE: The patient has  RIGHT KNEE OSTHEOARTHRITIS, Var deformities, XR shows bone on bone arthritis, lateral subluxation of tibia. Patient has failed all conservative measures including anti-inflammatory medicines, narcotics, attempts at exercise and weight loss, cortisone injections and viscosupplementation.  Risks and benefits of surgery have been discussed, questions answered.   DESCRIPTION OF PROCEDURE: The patient identified by armband, received  IV antibiotics, in the holding area at [facility]. Patient taken to the operating room, appropriate anesthetic monitors were attached, and GET anesthesia was  induced. IV Tranexamic acid was given.Tourniquet applied high to the operative thigh. Lateral post and foot positioner applied to the table, the lower extremity was then prepped and draped in usual sterile  fashion from the toes to the tourniquet. Time-out procedure was performed. The skin and subcutaneous tissue along the incision was injected with 20 cc of a mixture of Exparel and Marcaine solution, using a 20-gauge by 1-1/2 inch needle. We began the operation, with the knee flexed 130 degrees, by making the anterior midline incision starting at handbreadth above the patella going over the patella 1 cm medial to and 4 cm distal to the tibial tubercle. Small bleeders in the skin and the subcutaneous tissue identified and cauterized. Transverse retinaculum was incised and reflected medially and a medial parapatellar arthrotomy was accomplished. the patella was everted and theprepatellar fat pad resected. The superficial medial collateral ligament was then elevated from anterior to posterior along the proximal flare of the tibia and anterior half of the menisci resected. The knee was hyperflexed exposing bone on bone arthritis. Peripheral and notch osteophytes as well as the cruciate ligaments were then resected. We continued to work  our way around posteriorly along the proximal tibia, and externally rotated the tibia subluxing it out from underneath the femur. A McHale PCL retractor was placed through the notch and a lateral Hohmann retractor placed, and we then entered the proximal tibia in line with the Depuy starter drill in line with the axis of the tibia followed by an intramedullary guide rod and 0-degree posterior slope cutting guide. The tibial cutting guide, 4 degree posterior sloped, was pinned into place allowing resection of 5 mm of bone medially and 12 mm of bone laterally. Satisfied with the tibial resection, we then entered the distal femur 2 mm anterior to the PCL origin with the intramedullary guide rod and applied the distal femoral cutting guide set at 9 mm, with 5 degrees of valgus. This was pinned along the epicondylar axis. At this point, the distal femoral cut was accomplished without difficulty.  We then sized for a #7R femoral component and pinned the guide in 3 degrees of external rotation. The chamfer cutting guide was pinned into place. The anterior, posterior, and chamfer cuts were accomplished without difficulty followed by the Attune RP box cutting guide and the box cut. We also removed posterior osteophytes from the posterior femoral condyles. The posterior capsule was injected with Exparel solution. The knee was brought into full extension. We checked our extension gap and fit a 5 mm bearing. Distracting in extension with a lamina spreader,  bleeders in the posterior capsule, Posterior medial and posterior lateral gutter were cauterized.  The transexamic acid-soaked sponge was then placed in the gap of the knee in extension. The knee was flexed 30. The posterior patella cut was accomplished with the 9.5 mm Attune cutting guide, sized for a 1mm dome, and the fixation pegs drilled.The knee was then once again hyperflexed exposing the proximal tibia. We sized for a # 7 tibial base plate, applied the smokestack and the conical reamer followed by the the Delta fin keel punch. We then hammered into place the Attune RP trial femoral component, drilled the lugs, inserted a  5 mm trial bearing, trial patellar button, and took the knee through range of motion from 0-130 degrees. Medial and lateral ligamentous stability was checked. No thumb pressure was required for patellar Tracking. The tourniquet was inflated to 336mm for 25min. All trial components were removed, mating surfaces irrigated with pulse lavage, and dried with suction and sponges. 10 cc of the Exparel solution was applied to the cancellus bone of the patella distal femur and proximal tibia.  After waiting 30 seconds, the bony surfaces were again, dried with sponges. A double batch of DePuy HV cement was mixed and applied to all bony metallic mating surfaces except for the posterior condyles of the femur itself. In order, we hammered into  place the tibial tray and removed excess cement, the femoral component and removed excess cement. The final Attune RP bearing was inserted, and the knee brought to full extension with compression. The patellar button was clamped into place, and excess cement removed. The knee was held at 30 flexion with compression, while the cement cured. The wound was irrigated out with normal saline solution pulse lavage. The rest of the Exparel was injected into the parapatellar arthrotomy, subcutaneous tissues, and periosteal tissues. The parapatellar arthrotomy was closed with running #1 Vicryl suture. The subcutaneous tissue with 0 and 2-0 undyed Vicryl suture, and the skin with running 3-0 SQ vicryl. An Aquacil and Ace wrap were applied. The patient was taken to recovery room  without difficulty.   Kerin Salen 05/05/2019, 7:15 AM

## 2019-05-05 NOTE — Discharge Summary (Signed)
Patient ID: Matthew [name] MRN: [id number] DOB/AGE: 10/24/1950 68 y.o.  Admit date: 05/05/2019 Discharge date: 05/05/2019  Admission Diagnoses:  Principal Problem:   Osteoarthritis of right knee   Discharge Diagnoses:  Same  Past Medical History:  Diagnosis Date  . Anxiety   . Arthritis   . GERD (gastroesophageal reflux disease)    occ remote history  . Hypertension   . Prostate cancer ([place])   . Right knee pain    questionable meniscal tear  . Seasonal allergies     Surgeries: Procedure(s): RIGHT TOTAL KNEE ARTHROPLASTY on 05/05/2019   Consultants:   Discharged Condition: Improved  Hospital Course: Matthew [name] is an 68 y.o. male who was admitted 05/05/2019 for operative treatment ofOsteoarthritis of right knee. Patient has severe unremitting pain that affects sleep, daily activities, and work/hobbies. After pre-op clearance the patient was taken to the operating room on 05/05/2019 and underwent  Procedure(s): RIGHT TOTAL KNEE ARTHROPLASTY.    Patient was given perioperative antibiotics:  Anti-infectives (From admission, onward)   Start     Dose/Rate Route Frequency Ordered Stop   05/05/19 0600  ceFAZolin (ANCEF) IVPB 2g/100 mL premix     2 g 200 mL/hr over 30 Minutes Intravenous On call to O.R. 05/05/19 0543 05/05/19 0748       Patient was given sequential compression devices, early ambulation, and chemoprophylaxis to prevent DVT.  Patient benefited maximally from hospital stay and there were no complications.    Recent vital signs:  Patient Vitals for the past 24 hrs:  BP Temp Temp src Pulse Resp SpO2  05/05/19 0930 132/82 -- -- 64 10 93 %  05/05/19 0606 (!) 154/75 99.1 F (37.3 C) Oral 68 15 (!) 75 %     Recent laboratory studies: No results for input(s): WBC, HGB, HCT, PLT, NA, K, CL, CO2, BUN, CREATININE, GLUCOSE, INR, CALCIUM in the last 72 hours.  Invalid input(s): PT, 2   Discharge Medications:   Allergies as of 05/05/2019      Reactions    Other    Senior dose of flu shot, cause hear palpitations, anxious   Peanut-containing Drug Products Other (See Comments)   Nasal congestion      Medication List    STOP taking these medications   ibuprofen 800 MG tablet Commonly known as: ADVIL   oxybutynin 5 MG tablet Commonly known as: DITROPAN     TAKE these medications   aspirin EC 81 MG tablet Take 1 tablet (81 mg total) by mouth 2 (two) times daily.   celecoxib 200 MG capsule Commonly known as: CeleBREX Take 1 capsule (200 mg total) by mouth 2 (two) times daily.   cetirizine 10 MG tablet Commonly known as: ZYRTEC Take 10 mg by mouth every morning.   fluticasone 50 MCG/ACT nasal spray Commonly known as: FLONASE Place 1-2 sprays into both nostrils daily as needed for allergies or rhinitis.   lisinopril 20 MG tablet Commonly known as: ZESTRIL Take 20 mg by mouth daily.   oxyCODONE-acetaminophen 5-325 MG tablet Commonly known as: PERCOCET/ROXICET Take 1 tablet by mouth every 4 (four) hours as needed for severe pain.   tiZANidine 2 MG tablet Commonly known as: ZANAFLEX Take 1 tablet (2 mg total) by mouth every 6 (six) hours as needed.            Durable Medical Equipment  (From admission, onward)         Start     Ordered   05/05/19 0947  DME Walker rolling  Once    Question:  Patient needs a walker to treat with the following condition  Answer:  Status post right knee replacement   05/05/19 0946           Discharge Care Instructions  (From admission, onward)         Start     Ordered   05/05/19 0000  Weight bearing as tolerated     05/05/19 0957          Diagnostic Studies: DG Chest 2 View  Result Date: 05/02/2019 CLINICAL DATA:  Preoperative examination. EXAM: CHEST - 2 VIEW COMPARISON:  Single-view of the chest 08/28/2004. FINDINGS: Lungs clear. Heart size normal. Aortic atherosclerosis. No pneumothorax or pleural effusion. Pectus deformity is noted. IMPRESSION: No acute disease.  Atherosclerosis. Electronically Signed   By: Inge Rise M.D.   On: 05/02/2019 08:23    Disposition: Discharge disposition: 01-Home or Self Care       Discharge Instructions    Call MD / Call 911   Complete by: As directed    If you experience chest pain or shortness of breath, CALL 911 and be transported to the hospital emergency room.  If you develope a fever above 101 F, pus (white drainage) or increased drainage or redness at the wound, or calf pain, call your surgeon's office.   Constipation Prevention   Complete by: As directed    Drink plenty of fluids.  Prune juice may be helpful.  You may use a stool softener, such as Colace (over the counter) 100 mg twice a day.  Use MiraLax (over the counter) for constipation as needed.   Diet - low sodium heart healthy   Complete by: As directed    Driving restrictions   Complete by: As directed    No driving for 2 weeks   Increase activity slowly as tolerated   Complete by: As directed    Patient may shower   Complete by: As directed    You may shower without a dressing once there is no drainage.  Do not wash over the wound.  If drainage remains, cover wound with plastic wrap and then shower.   Weight bearing as tolerated   Complete by: As directed       Follow-up Information    Frederik Pear, MD. Go on 05/13/2019.   Specialty: Orthopedic Surgery Why: Your appointment has been scheduled for 9:30   Contact information: [place] [address] [phone]        [facility] Follow up.   Specialty: [place] Why: You will be seen at home for 5 visits prior to starting outpatient physical therapy  Contact information: [place] [address] [phone]        [facility] Follow up on 05/13/2019.   Why: You are scheduled to start outpatient physical therapy at 11:20. please go directly over to the therapy desk after MD appointment to  complete paperwork  Contact information: Physical Therapy [address] [place] [address] [phone]        Frederik Pear, MD In 2 weeks.   Specialty: Orthopedic Surgery Contact information: [place] [address] [phone]            Signed: Joanell Rising 05/05/2019, 9:58 AM

## 2019-05-05 NOTE — Anesthesia Preprocedure Evaluation (Addendum)
Anesthesia Evaluation  Patient identified by MRN, date of birth, ID band Patient awake    Reviewed: Allergy & Precautions, NPO status , Patient's Chart, lab work & pertinent test results  History of Anesthesia Complications Negative for: history of anesthetic complications  Airway Mallampati: III  TM Distance: >3 FB Neck ROM: Full    Dental  (+) Dental Advisory Given   Pulmonary neg recent URI, Current Smoker and Patient abstained from smoking.,    breath sounds clear to auscultation       Cardiovascular hypertension, Pt. on medications (-) angina(-) Past MI and (-) CHF  Rhythm:Regular     Neuro/Psych negative neurological ROS  negative psych ROS   GI/Hepatic Neg liver ROS, GERD  ,  Endo/Other  negative endocrine ROS  Renal/GU negative Renal ROS     Musculoskeletal   Abdominal   Peds  Hematology negative hematology ROS (+)   Anesthesia Other Findings   Reproductive/Obstetrics                            Anesthesia Physical Anesthesia Plan  ASA: II  Anesthesia Plan: General and Regional   Post-op Pain Management:  Regional for Post-op pain   Induction: Intravenous  PONV Risk Score and Plan: 1 and Treatment may vary due to age or medical condition, Ondansetron, Dexamethasone, TIVA and Propofol infusion  Airway Management Planned: LMA  Additional Equipment: None  Intra-op Plan:   Post-operative Plan: Extubation in OR  Informed Consent: I have reviewed the patients History and Physical, chart, labs and discussed the procedure including the risks, benefits and alternatives for the proposed anesthesia with the patient or authorized representative who has indicated his/her understanding and acceptance.     Dental advisory given  Plan Discussed with: CRNA and Surgeon  Anesthesia Plan Comments:         Anesthesia Quick Evaluation

## 2019-05-05 NOTE — Transfer of Care (Signed)
Immediate Anesthesia Transfer of Care Note  Patient: Matthew [name]  Procedure(s) Performed: RIGHT TOTAL KNEE ARTHROPLASTY (Right Knee)  Patient Location: PACU  Anesthesia Type:General  Level of Consciousness: drowsy and patient cooperative  Airway & Oxygen Therapy: Patient Spontanous Breathing and Patient connected to nasal cannula oxygen  Post-op Assessment: Report given to RN and Post -op Vital signs reviewed and stable  Post vital signs: Reviewed and stable  Last Vitals:  Vitals Value Taken Time  BP 132/82 05/05/19 0931  Temp    Pulse 60 05/05/19 0934  Resp 8 05/05/19 0934  SpO2 100 % 05/05/19 0934  Vitals shown include unvalidated device data.  Last Pain:  Vitals:   05/05/19 0606  TempSrc: Oral         Complications: No apparent anesthesia complications

## 2019-05-05 NOTE — Anesthesia Procedure Notes (Signed)
Procedure Name: LMA Insertion Date/Time: 05/05/2019 7:35 AM Performed by: Claudia Desanctis, CRNA Pre-anesthesia Checklist: Emergency Drugs available, Patient identified, Suction available and Patient being monitored Patient Re-evaluated:Patient Re-evaluated prior to induction Oxygen Delivery Method: Circle system utilized Preoxygenation: Pre-oxygenation with 100% oxygen Induction Type: IV induction Ventilation: Mask ventilation without difficulty LMA: LMA inserted LMA Size: 4.0 Number of attempts: 1 Placement Confirmation: positive ETCO2 and breath sounds checked- equal and bilateral Tube secured with: Tape Dental Injury: Teeth and Oropharynx as per pre-operative assessment

## 2019-05-05 NOTE — Evaluation (Signed)
Physical Therapy Evaluation Patient Details Name: Matthew [name] MRN: [id number] DOB: Sep 20, 1950 Today's Date: 05/05/2019   History of Present Illness  s/p R TKA. PMH: anxiety,  prostate CA  Clinical Impression  Pt is s/p TKA resulting in the deficits listed below (see PT Problem List).  PT seen for gait, initiation of HEP, pt with significant quad lag, unable to WB RLE, amb ~ 6' with RW and mod assist for balance and to prevent R knee buckling. Unsafe to d/c home at this time d/t above. anticipate steady progress, continue to follow  Pt will benefit from skilled PT to increase their independence and safety with mobility to allow discharge to the venue listed below.      Follow Up Recommendations Follow surgeon's recommendation for DC plan and follow-up therapies    Equipment Recommendations  None recommended by PT    Recommendations for Other Services       Precautions / Restrictions Precautions Precautions: Fall;Knee Restrictions Weight Bearing Restrictions: No      Mobility  Bed Mobility               General bed mobility comments: NT--pt in recliner  Transfers Overall transfer level: Needs assistance Equipment used: Rolling walker (2 wheeled) Transfers: Sit to/from Stand Sit to Stand: Min assist;Mod assist         General transfer comment: assist to rise and stabilize, cues for hand placement  Ambulation/Gait Ambulation/Gait assistance: Min assist;Mod assist Gait Distance (Feet): 6 Feet Assistive device: Rolling walker (2 wheeled) Gait Pattern/deviations: Step-to pattern;Decreased weight shift to left;Decreased stance time - left;Decreased step length - right;Decreased step length - left Gait velocity: decr   General Gait Details: heavy reliance on UEs and physical assist to prevent R knee buckling. cues for safety, sequence and use of UB when wt shifting  Stairs            Wheelchair Mobility    Modified Rankin (Stroke Patients Only)        Balance Overall balance assessment: Needs assistance   Sitting balance-Leahy Scale: Fair       Standing balance-Leahy Scale: Poor Standing balance comment: reliant on UEs and assist                             Pertinent Vitals/Pain Pain Assessment: 0-10 Pain Score: 3  Pain Location: right knee Pain Descriptors / Indicators: Sore Pain Intervention(s): Limited activity within patient's tolerance;Monitored during session    Home Living Family/patient expects to be discharged to:: Private residence Living Arrangements: Spouse/significant other Available Help at Discharge: Family Type of Home: House Home Access: Stairs to enter Entrance Stairs-Rails: Right;Left;Can reach both Entrance Stairs-Number of Steps: 2 Home Layout: One level Home Equipment: Environmental consultant - 2 wheels;Bedside commode Additional Comments: wife recently  had covid    Prior Function Level of Independence: Independent               Hand Dominance        Extremity/Trunk Assessment   Upper Extremity Assessment Upper Extremity Assessment: Overall WFL for tasks assessed    Lower Extremity Assessment Lower Extremity Assessment: RLE deficits/detail RLE Deficits / Details: ankle WFL (reports mild tingling). significant quad lag ~30* with SLR. knee AAROM grossly 10 to 65 degrees flexion       Communication   Communication: No difficulties  Cognition Arousal/Alertness: Awake/alert Behavior During Therapy: WFL for tasks assessed/performed Overall Cognitive Status: Within Functional Limits for tasks assessed  General Comments      Exercises Total Joint Exercises Ankle Circles/Pumps: AROM;Both;15 reps Quad Sets: 10 reps;Both;AROM Heel Slides: AAROM;Right;10 reps   Assessment/Plan    PT Assessment Patient needs continued PT services  PT Problem List Decreased strength;Decreased range of motion;Decreased activity tolerance;Decreased  mobility;Pain;Decreased knowledge of use of DME;Decreased knowledge of precautions       PT Treatment Interventions DME instruction;Therapeutic exercise;Gait training;Functional mobility training;Therapeutic activities;Patient/family education;Stair training    PT Goals (Current goals can be found in the Care Plan section)  Acute Rehab PT Goals Patient Stated Goal: home soon PT Goal Formulation: With patient Time For Goal Achievement: 05/12/19    Frequency 7X/week   Barriers to discharge        Co-evaluation               AM-PAC PT "6 Clicks" Mobility  Outcome Measure Help needed turning from your back to your side while in a flat bed without using bedrails?: A Little Help needed moving from lying on your back to sitting on the side of a flat bed without using bedrails?: A Little Help needed moving to and from a bed to a chair (including a wheelchair)?: A Lot Help needed standing up from a chair using your arms (e.g., wheelchair or bedside chair)?: A Lot Help needed to walk in hospital room?: A Lot Help needed climbing 3-5 steps with a railing? : Total 6 Click Score: 13    End of Session Equipment Utilized During Treatment: Gait belt Activity Tolerance: Patient tolerated treatment well Patient left: in chair;with call bell/phone within reach Nurse Communication: Mobility status PT Visit Diagnosis: Difficulty in walking, not elsewhere classified (R26.2)    Time: [id number] PT Time Calculation (min) (ACUTE ONLY): 30 min   Charges:   PT Evaluation $PT Eval Low Complexity: 1 Low PT Treatments $Gait Training: 8-22 mins        Baxter Flattery, PT   Acute Rehab Dept [facility]): [id number]   05/05/2019   [facility] 05/05/2019, 2:38 PM

## 2019-05-05 NOTE — Anesthesia Procedure Notes (Signed)
Anesthesia Regional Block: Adductor canal block   Pre-Anesthetic Checklist: ,, timeout performed, Correct Patient, Correct Site, Correct Laterality, Correct Procedure, Correct Position, site marked, Risks and benefits discussed,  Surgical consent,  Pre-op evaluation,  At surgeon's request and post-op pain management  Laterality: Right and Lower  Prep: chloraprep       Needles:  Injection technique: Single-shot     Needle Length: 9cm  Needle Gauge: 22     Additional Needles: Arrow StimuQuik ECHO Echogenic Stimulating PNB Needle  Procedures:,,,, ultrasound used (permanent image in chart),,,,  Narrative:  Start time: 05/05/2019 7:15 AM End time: 05/05/2019 7:19 AM Injection made incrementally with aspirations every 5 mL.  Performed by: Personally  Anesthesiologist: Oleta Mouse, MD

## 2019-05-05 NOTE — Discharge Instructions (Signed)

## 2019-05-06 ENCOUNTER — Encounter: Payer: Self-pay | Admitting: *Deleted

## 2019-05-06 DIAGNOSIS — M1711 Unilateral primary osteoarthritis, right knee: Secondary | ICD-10-CM | POA: Diagnosis not present

## 2019-05-06 LAB — CBC
HCT: 28.9 % — ABNORMAL LOW (ref 39.0–52.0)
Hemoglobin: 9.9 g/dL — ABNORMAL LOW (ref 13.0–17.0)
MCH: 32.4 pg (ref 26.0–34.0)
MCHC: 34.3 g/dL (ref 30.0–36.0)
MCV: 94.4 fL (ref 80.0–100.0)
Platelets: 141 10*3/uL — ABNORMAL LOW (ref 150–400)
RBC: 3.06 MIL/uL — ABNORMAL LOW (ref 4.22–5.81)
RDW: 11.8 % (ref 11.5–15.5)
WBC: 9.6 10*3/uL (ref 4.0–10.5)
nRBC: 0 % (ref 0.0–0.2)

## 2019-05-06 LAB — BASIC METABOLIC PANEL
Anion gap: 7 (ref 5–15)
BUN: 22 mg/dL (ref 8–23)
CO2: 24 mmol/L (ref 22–32)
Calcium: 8.7 mg/dL — ABNORMAL LOW (ref 8.9–10.3)
Chloride: 104 mmol/L (ref 98–111)
Creatinine, Ser: 0.98 mg/dL (ref 0.61–1.24)
GFR calc Af Amer: >60 mL/min (ref >60)
GFR calc non Af Amer: >60 mL/min (ref >60)
Glucose, Bld: 114 mg/dL — ABNORMAL HIGH (ref 70–99)
Potassium: 4 mmol/L (ref 3.5–5.1)
Sodium: 135 mmol/L (ref 135–145)

## 2019-05-06 NOTE — Progress Notes (Signed)
Physical Therapy Treatment Patient Details Name: Matthew [name] MRN: [id number] DOB: 28-Jul-1950 Today's Date: 05/06/2019    History of Present Illness s/p R TKA. PMH: anxiety,  prostate CA    PT Comments    POD # 1 am session Assisted OOB.  General bed mobility comments: demonstarted and instructed how to use a belt to self assist LE off bed.  General transfer comment: 25% VC's on proper hand placement and safety with turns.  General Gait Details: tolerated an increased distance and required 50% VC's on proper walker to self distance and safety with turns.  Then returned to room to perform some TE's following HEP handout.  Instructed on proper tech, freq as well as use of ICE.   Pt will need another PT session to address stairs.    Follow Up Recommendations  Follow surgeon's recommendation for DC plan and follow-up therapies     Equipment Recommendations  None recommended by PT    Recommendations for Other Services       Precautions / Restrictions Precautions Precautions: Fall;Knee Restrictions Weight Bearing Restrictions: No    Mobility  Bed Mobility Overal bed mobility: Needs Assistance Bed Mobility: Supine to Sit     Supine to sit: Min guard;Min assist     General bed mobility comments: demonstarted and instructed how to use a belt to self assist LE off bed  Transfers Overall transfer level: Needs assistance Equipment used: Rolling walker (2 wheeled) Transfers: Sit to/from Stand Sit to Stand: Min guard;Supervision         General transfer comment: 25% VC's on proper hand placement and safety with turns  Ambulation/Gait Ambulation/Gait assistance: Min guard Gait Distance (Feet): 45 Feet Assistive device: Rolling walker (2 wheeled) Gait Pattern/deviations: Step-to pattern;Decreased weight shift to left;Decreased stance time - left;Decreased step length - right;Decreased step length - left Gait velocity: decreased   General Gait Details: tolerated an  increased distance and required 50% VC's on proper walker to self distance and safety with turns   Marine scientist Rankin (Stroke Patients Only)       Balance                                            Cognition Arousal/Alertness: Awake/alert Behavior During Therapy: WFL for tasks assessed/performed Overall Cognitive Status: Within Functional Limits for tasks assessed                                        Exercises      General Comments        Pertinent Vitals/Pain Pain Assessment: 0-10 Pain Score: 3  Pain Location: right knee Pain Descriptors / Indicators: Sore;Tender Pain Intervention(s): Monitored during session;Premedicated before session;Repositioned;Ice applied    Home Living                      Prior Function            PT Goals (current goals can now be found in the care plan section) Progress towards PT goals: Progressing toward goals    Frequency    7X/week      PT Plan Discharge plan needs to be updated    Co-evaluation  AM-PAC PT "6 Clicks" Mobility   Outcome Measure  Help needed turning from your back to your side while in a flat bed without using bedrails?: A Little Help needed moving from lying on your back to sitting on the side of a flat bed without using bedrails?: A Little Help needed moving to and from a bed to a chair (including a wheelchair)?: A Little Help needed standing up from a chair using your arms (e.g., wheelchair or bedside chair)?: A Little Help needed to walk in hospital room?: A Little Help needed climbing 3-5 steps with a railing? : A Little 6 Click Score: 18    End of Session Equipment Utilized During Treatment: Gait belt Activity Tolerance: Patient tolerated treatment well Patient left: in chair;with call bell/phone within reach;with chair alarm set Nurse Communication: Mobility status PT Visit Diagnosis:  Difficulty in walking, not elsewhere classified (R26.2)     Time: [id number] PT Time Calculation (min) (ACUTE ONLY): 25 min  Charges:  $Gait Training: 8-22 mins $Therapeutic Exercise: 8-22 mins                     Rica Koyanagi  PTA Acute  Rehabilitation Services Pager      [phone] Office      [phone]

## 2019-05-06 NOTE — Progress Notes (Signed)
PATIENT ID: Matthew [name]  MRN: [id number]  DOB/AGE:  1951-03-30 / 67 y.o.  1 Day Post-Op Procedure(s) (LRB): RIGHT TOTAL KNEE ARTHROPLASTY (Right)    PROGRESS NOTE Subjective: Patient is alert, oriented, no Nausea, no Vomiting, yes passing gas. Taking PO well. Denies SOB, Chest or Calf Pain. Using Incentive Spirometer, PAS in place. Ambulate 6', Patient reports pain as 2/10 .    Objective: Vital signs in last 24 hours: Vitals:   05/05/19 1700 05/05/19 2038 05/06/19 0056 05/06/19 0552  BP: (Abnormal) 156/80 124/72 127/75 123/69  Pulse: 88 66 62 (Abnormal) 59  Resp: 16 18 18 18   Temp: (Abnormal) 97.3 F (36.3 C) 98.1 F (36.7 C) 98 F (36.7 C) 98 F (36.7 C)  TempSrc: Oral     SpO2: 97% 99% 98% 100%  Weight:      Height:          Intake/Output from previous day: I/O last 3 completed shifts: In: 2513.5 [P.O.:240; I.V.:1823.5; IV Piggyback:450] Out: 2100 [Urine:1950; Blood:150]   Intake/Output this shift: No intake/output data recorded.   LABORATORY DATA: Recent Labs    05/06/19 0230  WBC 9.6  HGB 9.9*  HCT 28.9*  PLT 141*  NA 135  K 4.0  CL 104  CO2 24  BUN 22  CREATININE 0.98  GLUCOSE 114*  CALCIUM 8.7*    Examination: Neurologically intact ABD soft Neurovascular intact Sensation intact distally Intact pulses distally Dorsiflexion/Plantar flexion intact Incision: dressing C/D/I No cellulitis present Compartment soft}  Assessment:   1 Day Post-Op Procedure(s) (LRB): RIGHT TOTAL KNEE ARTHROPLASTY (Right) ADDITIONAL DIAGNOSIS: Expected Acute Blood Loss Anemia, hx prostate Cancer  Patient's anticipated LOS is less than 2 midnights, meeting these requirements: - Younger than 8 - Lives within 1 hour of care - Has a competent adult at home to recover with post-op recover - NO history of  - Chronic pain requiring opiods  - Diabetes  - Coronary Artery Disease  - Heart failure  - Heart attack  - Stroke  - DVT/VTE  - Cardiac arrhythmia  -  Respiratory Failure/COPD  - Renal failure  - Anemia  - Advanced Liver disease       Plan: PT/OT WBAT, AROM and PROM  DVT Prophylaxis:  SCDx72hrs, ASA 81 mg BID x 2 weeks DISCHARGE PLAN: Home, after PT clearance DISCHARGE NEEDS: HHPT, Walker and 3-in-1 comode seat     Kerin Salen 05/06/2019, 7:58 AM

## 2019-05-06 NOTE — Plan of Care (Signed)
resolved 

## 2019-05-06 NOTE — Progress Notes (Signed)
Physical Therapy Treatment Patient Details Name: Matthew [name] MRN: [id number] DOB: [birth year] Today's Date: 05/06/2019    History of Present Illness s/p R TKA. PMH: anxiety,  prostate CA    PT Comments    POD # 1 pm session Assisted with amb a greater distance.  General Gait Details: tolerated an increased distance and required 50% VC's on proper walker to self distance and safety with turns.  Practiced stairs.  Addressed all mobility questions, discussed appropriate activity, educated on use of ICE.  Pt ready for D/C to home.   Follow Up Recommendations  Follow surgeon's recommendation for DC plan and follow-up therapies     Equipment Recommendations  None recommended by PT    Recommendations for Other Services       Precautions / Restrictions Precautions Precautions: Fall;Knee Restrictions Weight Bearing Restrictions: No    Mobility  Bed Mobility Overal bed mobility: Needs Assistance Bed Mobility: Supine to Sit     Supine to sit: Min guard;Min assist     General bed mobility comments: demonstarted and instructed how to use a belt to self assist LE off bed  Transfers Overall transfer level: Needs assistance Equipment used: Rolling walker (2 wheeled) Transfers: Sit to/from Stand Sit to Stand: Min guard;Supervision         General transfer comment: 25% VC's on proper hand placement and safety with turns  Ambulation/Gait Ambulation/Gait assistance: Min guard Gait Distance (Feet): 75 Feet Assistive device: Rolling walker (2 wheeled) Gait Pattern/deviations: Step-to pattern;Decreased weight shift to left;Decreased stance time - left;Decreased step length - right;Decreased step length - left Gait velocity: decreased   General Gait Details: tolerated an increased distance and required 50% VC's on proper walker to self distance and safety with turns   Stairs Stairs: Yes Stairs assistance: Supervision;Min guard Stair Management: Two rails;Forwards Number  of Stairs: 2 General stair comments: 25% VC's on proper sequencing and safety   Wheelchair Mobility    Modified Rankin (Stroke Patients Only)       Balance                                            Cognition Arousal/Alertness: Awake/alert Behavior During Therapy: WFL for tasks assessed/performed Overall Cognitive Status: Within Functional Limits for tasks assessed                                        Exercises      General Comments        Pertinent Vitals/Pain Pain Assessment: 0-10 Pain Score: 3  Pain Location: right knee Pain Descriptors / Indicators: Sore;Tender Pain Intervention(s): Monitored during session;Premedicated before session;Repositioned;Ice applied    Home Living                      Prior Function            PT Goals (current goals can now be found in the care plan section) Progress towards PT goals: Progressing toward goals    Frequency    7X/week      PT Plan Discharge plan needs to be updated    Co-evaluation              AM-PAC PT "6 Clicks" Mobility   Outcome Measure  Help needed turning from your  back to your side while in a flat bed without using bedrails?: A Little Help needed moving from lying on your back to sitting on the side of a flat bed without using bedrails?: A Little Help needed moving to and from a bed to a chair (including a wheelchair)?: A Little Help needed standing up from a chair using your arms (e.g., wheelchair or bedside chair)?: A Little Help needed to walk in hospital room?: A Little Help needed climbing 3-5 steps with a railing? : A Little 6 Click Score: 18    End of Session Equipment Utilized During Treatment: Gait belt Activity Tolerance: Patient tolerated treatment well Patient left: in chair;with call bell/phone within reach;with chair alarm set Nurse Communication: Mobility status PT Visit Diagnosis: Difficulty in walking, not elsewhere  classified (R26.2)     Time: 1415-1440 PT Time Calculation (min) (ACUTE ONLY): 25 min  Charges:  $Gait Training: 8-22 mins $Therapeutic Exercise: 8-22 mins                     Rica Koyanagi  PTA Acute  Rehabilitation Services Pager      [phone] Office      [phone]

## 2019-05-07 DIAGNOSIS — Z471 Aftercare following joint replacement surgery: Secondary | ICD-10-CM | POA: Diagnosis not present

## 2019-05-07 DIAGNOSIS — Z96651 Presence of right artificial knee joint: Secondary | ICD-10-CM | POA: Diagnosis not present

## 2019-05-07 NOTE — Anesthesia Postprocedure Evaluation (Signed)
Anesthesia Post Note  Patient: Matthew [name]  Procedure(s) Performed: RIGHT TOTAL KNEE ARTHROPLASTY (Right Knee)     Patient location during evaluation: PACU Anesthesia Type: Regional and General Level of consciousness: awake and patient cooperative Pain management: pain level controlled Vital Signs Assessment: post-procedure vital signs reviewed and stable Respiratory status: spontaneous breathing, nonlabored ventilation, respiratory function stable and patient connected to nasal cannula oxygen Cardiovascular status: blood pressure returned to baseline and stable Postop Assessment: no apparent nausea or vomiting Anesthetic complications: no    Last Vitals:  Vitals:   05/06/19 0552 05/06/19 1000  BP: 123/69 121/65  Pulse: (!) 59 64  Resp: 18 18  Temp: 36.7 C   SpO2: 100% 100%    Last Pain:  Vitals:   05/06/19 0339  TempSrc:   PainSc: Asleep                 CHRISTOPHER MOSER

## 2019-05-13 DIAGNOSIS — M25661 Stiffness of right knee, not elsewhere classified: Secondary | ICD-10-CM | POA: Diagnosis not present

## 2019-05-13 DIAGNOSIS — Z96651 Presence of right artificial knee joint: Secondary | ICD-10-CM | POA: Diagnosis not present

## 2019-05-13 DIAGNOSIS — M1711 Unilateral primary osteoarthritis, right knee: Secondary | ICD-10-CM | POA: Diagnosis not present

## 2019-05-15 DIAGNOSIS — M25661 Stiffness of right knee, not elsewhere classified: Secondary | ICD-10-CM | POA: Diagnosis not present

## 2019-05-15 DIAGNOSIS — Z96651 Presence of right artificial knee joint: Secondary | ICD-10-CM | POA: Diagnosis not present

## 2019-05-19 DIAGNOSIS — M25661 Stiffness of right knee, not elsewhere classified: Secondary | ICD-10-CM | POA: Diagnosis not present

## 2019-05-19 DIAGNOSIS — Z96651 Presence of right artificial knee joint: Secondary | ICD-10-CM | POA: Diagnosis not present

## 2019-05-20 DIAGNOSIS — C61 Malignant neoplasm of prostate: Secondary | ICD-10-CM | POA: Diagnosis not present

## 2019-05-20 DIAGNOSIS — Z5111 Encounter for antineoplastic chemotherapy: Secondary | ICD-10-CM | POA: Diagnosis not present

## 2019-05-21 DIAGNOSIS — M25661 Stiffness of right knee, not elsewhere classified: Secondary | ICD-10-CM | POA: Diagnosis not present

## 2019-05-21 DIAGNOSIS — Z96651 Presence of right artificial knee joint: Secondary | ICD-10-CM | POA: Diagnosis not present

## 2019-05-26 DIAGNOSIS — Z96651 Presence of right artificial knee joint: Secondary | ICD-10-CM | POA: Diagnosis not present

## 2019-05-26 DIAGNOSIS — M25661 Stiffness of right knee, not elsewhere classified: Secondary | ICD-10-CM | POA: Diagnosis not present

## 2019-05-28 DIAGNOSIS — Z96651 Presence of right artificial knee joint: Secondary | ICD-10-CM | POA: Diagnosis not present

## 2019-05-28 DIAGNOSIS — M25661 Stiffness of right knee, not elsewhere classified: Secondary | ICD-10-CM | POA: Diagnosis not present

## 2019-06-02 DIAGNOSIS — M25661 Stiffness of right knee, not elsewhere classified: Secondary | ICD-10-CM | POA: Diagnosis not present

## 2019-06-02 DIAGNOSIS — Z96651 Presence of right artificial knee joint: Secondary | ICD-10-CM | POA: Diagnosis not present

## 2019-06-04 DIAGNOSIS — Z96651 Presence of right artificial knee joint: Secondary | ICD-10-CM | POA: Diagnosis not present

## 2019-06-04 DIAGNOSIS — M25661 Stiffness of right knee, not elsewhere classified: Secondary | ICD-10-CM | POA: Diagnosis not present

## 2019-06-10 DIAGNOSIS — Z96651 Presence of right artificial knee joint: Secondary | ICD-10-CM | POA: Diagnosis not present

## 2019-06-10 DIAGNOSIS — Z471 Aftercare following joint replacement surgery: Secondary | ICD-10-CM | POA: Diagnosis not present

## 2019-06-12 ENCOUNTER — Ambulatory Visit: Payer: PPO | Admitting: Neurology

## 2019-06-19 ENCOUNTER — Ambulatory Visit: Payer: PPO

## 2019-06-27 ENCOUNTER — Ambulatory Visit: Payer: PPO

## 2019-07-24 DIAGNOSIS — Z96651 Presence of right artificial knee joint: Secondary | ICD-10-CM | POA: Diagnosis not present

## 2019-07-24 DIAGNOSIS — Z471 Aftercare following joint replacement surgery: Secondary | ICD-10-CM | POA: Diagnosis not present

## 2019-08-05 ENCOUNTER — Ambulatory Visit (INDEPENDENT_AMBULATORY_CARE_PROVIDER_SITE_OTHER): Payer: PPO | Admitting: Neurology

## 2019-08-05 ENCOUNTER — Other Ambulatory Visit: Payer: Self-pay

## 2019-08-05 ENCOUNTER — Telehealth: Payer: Self-pay

## 2019-08-05 ENCOUNTER — Encounter: Payer: Self-pay | Admitting: Neurology

## 2019-08-05 VITALS — BP 160/85 | HR 69 | Ht 66.0 in | Wt 170.0 lb

## 2019-08-05 DIAGNOSIS — G20 Parkinson's disease: Secondary | ICD-10-CM | POA: Diagnosis not present

## 2019-08-05 DIAGNOSIS — G4752 REM sleep behavior disorder: Secondary | ICD-10-CM | POA: Diagnosis not present

## 2019-08-05 DIAGNOSIS — G20C Parkinsonism, unspecified: Secondary | ICD-10-CM

## 2019-08-05 NOTE — Progress Notes (Signed)
Subjective:    Patient ID: Matthew [name] is a 69 y.o. male.  HPI     Star Age, MD, PhD [facility] [address], [address] [place], [place] [address]  Dear Dr. Harrington Challenger,    I saw your patient, Caidon Moulds, upon your kind request in my neurologic clinic today for initial consultation of his coordination problem.  The patient is unaccompanied today.  As you know, Mr. Davydov is a 69 year old right-handed gentleman with an underlying medical history of hypertension, reflux disease, arthritis with status post right total knee replacement, seasonal allergies, anxiety, and prostate cancer, With status post prostate surgery in November 2019 and status post XRT in 2020, who reports problems with his fine motor skills and coordination for the past 2+ years.  He feels that his symptoms are primarily noted in the right hand.  He has not had any tremors but has had difficulty with buttoning and also when playing the guitar.  He has been playing the guitar off and on for over 50 years.  He picked it up again after he retired some 2-1/2 years ago and noticed that he was having trouble.  He has had some issues with arthritis in the hands and attributed most of his difficulty to arthritis.  He has noticed stiffness.  In addition, he has noticed slowness as an it takes him longer to do things.  He has had some balance problems but no recent fall with the exception of a recent incident at home when he slipped off of a yoga exercise ball, he fell but did not injure himself.  I reviewed your telemedicine note from 03/10/2019 as well as office note from 07/04/2018.  He denies any mood related issues or cognitive issues.  He sleeps fairly well but does have vivid dreams and reports acting out in his dreams from time to time.  Once he fell out of bed, his wife has noted that he mumbles or talks in his sleep and also moves his limbs while asleep.  This has been going on for years as he  recalls.  He does not smoke any cigarettes except for some smoking when he was a teenager reported.  He does smoke the pipe, 1 or 2/day.  He drinks alcohol in the form of some liquor, 1-2 servings per day on average.  He drinks caffeine in the form of coffee.  He had a knee replacement surgery last year.  He no longer takes any narcotic pain medication but does take ibuprofen 800 mg 2 or 3 times a day as needed.  He is on Lupron.   His Past Medical History Is Significant For: Past Medical History:  Diagnosis Date  . Anxiety   . Arthritis   . GERD (gastroesophageal reflux disease)    occ remote history  . Hypertension   . Prostate cancer ([place])   . Right knee pain    questionable meniscal tear  . Seasonal allergies     His Past Surgical History Is Significant For: Past Surgical History:  Procedure Laterality Date  . COLONOSCOPY    . Leg Laceration Repair  Left    Age 67  . PELVIC LYMPH NODE DISSECTION Bilateral 03/22/2018   Procedure: PELVIC LYMPH NODE DISSECTION;  Surgeon: Ceasar Mons, MD;  Location: [facility];  Service: Urology;  Laterality: Bilateral;  . PROSTATE BIOPSY    . ROBOT ASSISTED LAPAROSCOPIC RADICAL PROSTATECTOMY N/A 03/22/2018   Procedure: XI ROBOTIC ASSISTED LAPAROSCOPIC PROSTATECTOMY;  Surgeon:  Ceasar Mons, MD;  Location: [facility];  Service: Urology;  Laterality: N/A;  . TOTAL KNEE ARTHROPLASTY Right 05/05/2019   Procedure: RIGHT TOTAL KNEE ARTHROPLASTY;  Surgeon: Frederik Pear, MD;  Location: [facility];  Service: Orthopedics;  Laterality: Right;    His Family History Is Significant For: Family History  Problem Relation Age of Onset  . Breast cancer Mother        49s  . Heart attack Father 39  . Colon cancer Maternal Grandmother   . Prostate cancer Neg Hx   . Pancreatic cancer Neg Hx     His Social History Is Significant For: Social History   Socioeconomic History  . Marital status: Married    Spouse name: Not on file  . Number of  children: 2  . Years of education: Not on file  . Highest education level: Not on file  Occupational History  . Not on file  Tobacco Use  . Smoking status: Current Every Day Smoker    Years: 20.00    Types: Pipe  . Smokeless tobacco: Never Used  Substance and Sexual Activity  . Alcohol use: Yes    Alcohol/week: 1.0 - 2.0 standard drinks    Types: 1 - 2 Shots of liquor per week    Comment: per day  . Drug use: Never  . Sexual activity: Not Currently  Other Topics Concern  . Not on file  Social History Narrative  . Not on file   Social Determinants of Health   Financial Resource Strain:   . Difficulty of Paying Living Expenses:   Food Insecurity:   . Worried About Charity fundraiser in the Last Year:   . Arboriculturist in the Last Year:   Transportation Needs:   . Film/video editor (Medical):   Marland Kitchen Lack of Transportation (Non-Medical):   Physical Activity:   . Days of Exercise per Week:   . Minutes of Exercise per Session:   Stress:   . Feeling of Stress :   Social Connections:   . Frequency of Communication with Friends and Family:   . Frequency of Social Gatherings with Friends and Family:   . Attends Religious Services:   . Active Member of Clubs or Organizations:   . Attends Archivist Meetings:   Marland Kitchen Marital Status:     His Allergies Are:  Allergies  Allergen Reactions  . Other     Senior dose of flu shot, cause hear palpitations, anxious  . Peanut-Containing Drug Products Other (See Comments)    Nasal congestion  :   His Current Medications Are:  Outpatient Encounter Medications as of 08/05/2019  Medication Sig  . cetirizine (ZYRTEC) 10 MG tablet Take 10 mg by mouth every morning.   . fluticasone (FLONASE) 50 MCG/ACT nasal spray Place 1-2 sprays into both nostrils daily as needed for allergies or rhinitis.  Marland Kitchen ibuprofen (ADVIL) 800 MG tablet Take 800 mg by mouth in the morning and at bedtime.  Marland Kitchen lisinopril (ZESTRIL) 20 MG tablet Take 20 mg  by mouth daily.  . [DISCONTINUED] aspirin EC 81 MG tablet Take 1 tablet (81 mg total) by mouth 2 (two) times daily.  . [DISCONTINUED] celecoxib (CELEBREX) 200 MG capsule Take 1 capsule (200 mg total) by mouth 2 (two) times daily.  . [DISCONTINUED] oxyCODONE-acetaminophen (PERCOCET/ROXICET) 5-325 MG tablet Take 1 tablet by mouth every 4 (four) hours as needed for severe pain.  . [DISCONTINUED] tiZANidine (ZANAFLEX) 2 MG tablet Take 1 tablet (  2 mg total) by mouth every 6 (six) hours as needed.   No facility-administered encounter medications on file as of 08/05/2019.  :   Review of Systems:  Out of a complete 14 point review of systems, all are reviewed and negative with the exception of these symptoms as listed below:    Review of Systems  Neurological:       Pt presents today to discuss coordination issues with his right hand. He notices trouble playing guitar and writing in cursive. He denies tremors. Pt is right handed.    Objective:  Neurological Exam  Physical Exam Physical Examination:   Vitals:   08/05/19 1420  BP: (!) 160/85  Pulse: 69   General Examination: The patient is a very pleasant 69 y.o. male in no acute distress. He appears well-developed and well-nourished and well groomed.   HEENT: Normocephalic, atraumatic, pupils are equal, round and reactive to light and accommodation. Funduscopic exam is normal with sharp disc margins noted.  Mild cataracts are noted bilaterally, face is symmetric with mild facial masking noted, he has mild to moderate nuchal rigidity.  Extraocular tracking shows mild saccadic breakdown, no nystagmus noted.  Hearing is grossly intact, mild decrease in eye blink rate is noted.  Slight leaning of the upper body to the right when sitting in the chair.  No lip, neck or jaw tremor noted, no dysarthria, perhaps mild hypophonia at slowness in speech. Oropharynx exam reveals: mild mouth dryness, adequate dental hygiene. tongue protrudes centrally and  palate elevates symmetrically.   Chest: Clear to auscultation without wheezing, rhonchi or crackles noted.  Heart: S1+S2+0, regular and normal without murmurs, rubs or gallops noted.   Abdomen: Soft, non-tender and non-distended with normal bowel sounds appreciated on auscultation.  Extremities: There is trace pitting edema in the R ankle.   Skin: Warm and dry without trophic changes noted.  Musculoskeletal: exam reveals mild R knee swelling.  Neurologically:  Mental status: The patient is awake, alert and oriented in all 4 spheres. His immediate and remote memory, attention, language skills and fund of knowledge are appropriate. There is no evidence of aphasia, agnosia, apraxia or anomia. Speech is clear with normal prosody and enunciation. Thought process is linear. Mood is normal and affect is normal.  Cranial nerves II - XII are as described above under HEENT exam. In addition: shoulder shrug is normal with equal shoulder height noted. Motor exam: Normal bulk, strength. There is no drift, resting, postural or action tremor. Romberg is negative. Reflexes are 2+ throughout. Babinski: Toes are flexor bilaterally.    On 08/05/2019: On Archimedes spiral drawing he has no significant difficulty with the right hand, slight insecurity noted with the left hand, handwriting is legible for the most part but micrographic.  Fine motor skills and coordination: He has on finger taps mild to moderate difficulty on the right, hand movements are mildly impaired on the right with cocontraction noted with the left hand.  Rapid alternating patting is mildly impaired bilaterally.  Left hand finger taps are minimally impaired, hand movements are normal on the left hand.  Foot agility is mildly impaired on the right and fairly normal on the left, foot tapping is mildly impaired on the right and minimally impaired on the left.  He stands up with slight difficulty and pushes himself up.  Posture is mildly stooped for  age.  He reports stiffness in the back when standing.  He walks with decreased arm swing bilaterally, right side more than left, very  slight insecurity with turns, slightly decreased pace and stride length noted. Cerebellar testing: No dysmetria or intention tremor. There is no truncal or gait ataxia.  Sensory exam: intact to light touch in the upper and lower extremities.   Assessment and Plan:   In summary, Sushil Aldi is a very pleasant 69 y.o.-year old male with an underlying medical history of hypertension, reflux disease, arthritis with status post right total knee replacement, seasonal allergies, anxiety, and prostate cancer, With status post prostate surgery in November 2019 and status post XRT in 2020, who presents for evaluation of his coordination and fine motor skill difficulties over 2 years duration.  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  His history and exam findings are not particularly alarming for any atypical parkinsonism.   I had a long chat with the patient about His symptoms, my findings and the diagnosis of parkinsonism/Parksinson's disease, its prognosis and treatment options. We talked about medical treatments and non-pharmacological approaches. We talked about the importance of maintaining a healthy lifestyle in general. I encouraged the patient to eat healthy, exercise daily and keep well hydrated, to keep a scheduled bedtime and wake time routine, to not skip any meals and eat healthy snacks in between meals and to have protein with every meal. In particular, I stressed the importance of regular exercise, within of course the patient's own mobility limitations.  His history is also supportive of dream enactment behavior intermittently for the past years.  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  REM  behavior disorder has been correlated with parkinsonian disorders and may proceed the diagnosis of parkinsonism or Parkinson's disease by years. As far as further diagnostic testing is concerned, I suggested we consider a DaTscan which is a nuclear medicine SPECT scan.  He reports having had a bone scan of the whole body, he had a nuclear medicine bone scan in September 2019.  He would like to discuss this with his wife, he was provided written information about the scan.  He is advised that he would have to consent in writing for this scan.  We will need insurance authorization as well.  We talked about symptomatic medication options.  I would like to suggest a trial of a dopamine agonist such as Requip generic.  He was given information in writing so he can discuss this with his family and also think about it.  I did not prescribe any new medication today.  He is advised to follow-up for a recheck in 3 months, sooner if needed.  He is welcome to call if he decides to pursue the DaTscan or would like to start medication before his next appointment.  He can also email through my chart.  I answered all his questions today and he was in agreement.  Thank you very much for allowing me to participate in the care of this nice patient. If I can be of any further assistance to you please do not hesitate to call me at [phone].  Sincerely,   Star Age, MD, PhD

## 2019-08-05 NOTE — Patient Instructions (Signed)
I think you have signs and symptoms of Parkinson's disease, with more right sided findings. As explained, there are several symptomatic medication treatment options.   This disease does progress with time. It can affect your balance, your memory, your mood, your bowel and bladder function, your posture, balance and walking and your activities of daily living. However, there are good supportive treatments and symptomatic treatments available, so most patients have a change to a good quality life and life expectancy is not typically altered. Overall you are doing fairly healthwise, but I do want to suggest a few things today:  Remember to drink plenty of fluid at least 6 glasses (8 oz each), eat healthy meals and do not skip any meals. Try to eat protein with a every meal and eat a healthy snack such as fruit or nuts in between meals. Try to keep a regular sleep-wake schedule and try to exercise daily, particularly in the form of walking, 20-30 minutes a day, if you can.   Try to stay active physically and mentally. Engage in social activities in your community and with your family and try to keep up with current events by reading the newspaper or watching the news. Try to do word puzzles and you may like to do puzzles and brain games on the computer such as on [email or web address]/.   As far as your medications are concerned, we can consider: Requip (generic name: ropinirole) 0.25 mg: we would start slowly and increase slowly: 1 one pill twice daily (morning and lunchtime) for one week, then one pill 3 times a day (morning, lunch and evening) for one week, then 2 pills 3 times a day for one week, then 3 pills three times a day thereafter. Common side effects reported are: Sedation, sleepiness, nausea, vomiting, and rare side effects are confusion, hallucinations, swelling in legs, and abnormal behaviors, including impulse control problems, which can manifest as excessive eating, obsessions with food or gambling, or  hypersexuality.  As far as diagnostic testing, I would like to consider a DaT scan: This is a specialized brain scan designed to help with diagnosis of tremor disorders. A radioactive marker gets injected and the uptake is measured in the brain and compared to normal controls and right side is compared to the left, a change in uptake can help with diagnosis of certain tremor disorders. A brain MRI on the other hand is a brain scan that helps look at the brain structure in more detail overall and look for age-related changes, blood vessel related changes and look for stroke and volume loss which we call atrophy.   I would like to see you back in 3 months, sooner if we need to. Please call us with any interim questions, concerns, problems, updates or refill requests.  Our phone number is [phone]. We also have an after hours call service for urgent matters and there is a physician on-call for urgent questions, that cannot wait till the next work day. For any emergencies you know to call 911 or go to the nearest emergency room.   You can email me through my chart and also leave a phone message for Cyril Mourning, my nurse.

## 2019-08-05 NOTE — Telephone Encounter (Signed)
Pt completed the consent for insurance auth for DAT scan. He is unsure if he wants to complete this test but would like the consent to be held on file in case he changes his mind. Pt will call us if he does. Will give consent to [facility].

## 2019-08-15 IMAGING — NM NM BONE WHOLE BODY
2 series · 2 of 2 positions shown · non-contrast
Comparison: CT of the abdomen pelvis 02/01/2018

CLINICAL DATA: Prostate cancer.  History of right knee pain.

EXAM:
NUCLEAR MEDICINE WHOLE BODY BONE SCAN
TECHNIQUE: Whole body anterior and posterior images were obtained approximately
3 hours after intravenous injection of radiopharmaceutical.
RADIOPHARMACEUTICALS:  20 mCi Hechnetium-11m MDP IV

[Series 1: wbr_bone_40 whole body · 2.66mm/px · 1 of 1 slices shown (1 of 2)]
[im 1/1]
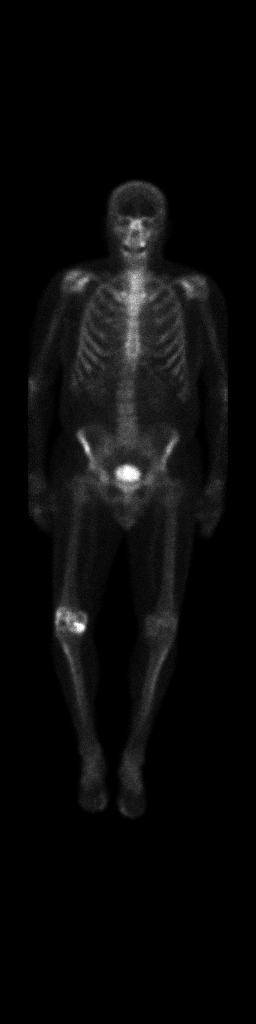

[Series 1: wbr_bone_40 whole body · 2.66mm/px · 1 of 1 slices shown (2 of 2)]
[im 1/1]
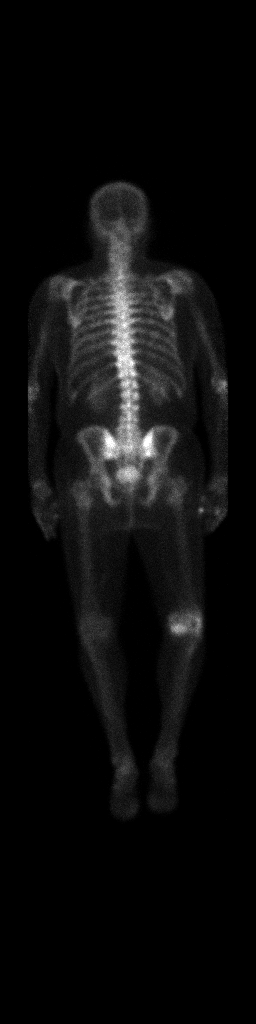

[2 of 2 positions shown; findings below may reference images not displayed]

FINDINGS: Normal uptake in the renal collecting system and urinary bladder.
Markedly increased uptake involving the right knee particularly
along the medial aspect. Findings are suggestive for right knee
osteoarthritis. No suspicious uptake in the pelvis, spine or ribs.
Slightly increased uptake along the left side of the maxilla is
probably odontogenic.
IMPRESSION: 1. No evidence for metastatic bone disease.
2. Markedly increased uptake involving the right knee is likely
related to significant degenerative disease.
3. Increased uptake along the left maxilla is probably related to
dental disease.

## 2019-08-20 ENCOUNTER — Telehealth: Payer: Self-pay | Admitting: Neurology

## 2019-08-20 NOTE — Telephone Encounter (Signed)
Pt called wanting to discuss his medications with the RN. Please advise.

## 2019-08-21 MED ORDER — ROPINIROLE HCL 0.25 MG PO TABS: ORAL_TABLET | ORAL | 5 refills | Status: DC

## 2019-08-21 NOTE — Telephone Encounter (Signed)
See my other telephone note with pt from today.

## 2019-08-21 NOTE — Telephone Encounter (Signed)
I talked to patient and talked to him and let him Know I will proceed with DAT scan process and it will take about two weeks for approval he is ok with that .   Cyril Mourning he wants you to call him back about an injection he said he talked to you about already. Patient could not tell me what kind of injection it was. Thanks Hinton Dyer .

## 2019-08-21 NOTE — Telephone Encounter (Signed)
Pt wants to know if insurance will cover a DaT scan. Hinton Dyer, can you check into this and call him? You have been holding his consent for DaT scan.

## 2019-08-21 NOTE — Telephone Encounter (Signed)
Requip Rx with titration instructions sent to pharmacy on file.

## 2019-08-21 NOTE — Telephone Encounter (Signed)
Please also advise patient that I have no reservations regarding the shingles vaccine in his case.  I am not familiar with Lupron, I am not aware of any interaction between the Lupron and the Requip.  He may be able to talk to his pharmacist as he picks up the prescription to make sure there are no reported interactions.

## 2019-08-21 NOTE — Telephone Encounter (Signed)
I called pt and discussed this with him. Pt will also check with his pharmacist regarding lupron and requip. Pt will let us know if he has any side effects. Pt verbalized understanding of recommendations.

## 2019-08-21 NOTE — Telephone Encounter (Signed)
I called pt. He is interested in starting requip as discussed at the last appt. He has the information on potential side effects already.  He asked me to add lupron injections q 6 months to his med list. He wants to make sure that won't interfere with requip.  He is considering the shingles vaccine and wants to make sure that Dr. Rexene Alberts has no reservations on him receiving that vaccine.  Pt is also interested if his insurance will cover DaT Scan. I reminded him that at the last visit he opted to hold off on the DaT scan so we did not pursue insurance auth and scheduling (insurance Josem Kaufmann will expire). Pt wants to start this process and I will ask Hinton Dyer to proceed.

## 2019-09-04 NOTE — Telephone Encounter (Signed)
Order for DaT scan has been placed per VO from Dr. Rexene Alberts.

## 2019-09-04 NOTE — Telephone Encounter (Signed)
Noted sent for scheduling

## 2019-09-04 NOTE — Addendum Note (Signed)
Addended by: Lester Lodi A on: 09/04/2019 03:22 PM   Modules accepted: Orders

## 2019-09-23 ENCOUNTER — Telehealth: Payer: Self-pay | Admitting: Neurology

## 2019-09-23 NOTE — Telephone Encounter (Signed)
Simerson,[name](wife on DPR) has called, she states pt has had several side effects to rOPINIRole (REQUIP) 0.25 MG tablet.  Pt is having head aches, dizziness, falling asleep right away and poor bladder control.

## 2019-09-23 NOTE — Telephone Encounter (Signed)
Please call patient, if he is having side effects, he should stop taking the medication, he can taper off by reducing by 1 pill every 3 days to a complete stop.

## 2019-09-23 NOTE — Telephone Encounter (Signed)
I reached out to the pt's wife and advised of tapering schedule. She verbalized understanding. Pt will begin on 09/24/2019. Pt is also scheduled for dat scan on 10/02/2019. Once results are back/finalized pt would like to move up his June f/u appointment.  Appointment will be rescheduled once we call with dat scan results.

## 2019-09-25 DIAGNOSIS — Z0000 Encounter for general adult medical examination without abnormal findings: Secondary | ICD-10-CM | POA: Diagnosis not present

## 2019-09-25 DIAGNOSIS — I10 Essential (primary) hypertension: Secondary | ICD-10-CM | POA: Diagnosis not present

## 2019-09-25 DIAGNOSIS — R7309 Other abnormal glucose: Secondary | ICD-10-CM | POA: Diagnosis not present

## 2019-09-25 DIAGNOSIS — E7800 Pure hypercholesterolemia, unspecified: Secondary | ICD-10-CM | POA: Diagnosis not present

## 2019-09-25 DIAGNOSIS — M19049 Primary osteoarthritis, unspecified hand: Secondary | ICD-10-CM | POA: Diagnosis not present

## 2019-09-25 DIAGNOSIS — J309 Allergic rhinitis, unspecified: Secondary | ICD-10-CM | POA: Diagnosis not present

## 2019-09-30 NOTE — Telephone Encounter (Addendum)
I reached out to the pt. He wanted to make sure his current medications would not interfere with his up coming dat scan.  I advised the potential drug interference drugs with the dat scan are : Ephedrine, ketamine, Isoflurane, Cocaine, methylphenidate, Methamphetamine, mazindol, modafinil, Benztropine, Fentanyl, Dexamphetamine, Bupropion, phentermine and phencyclidine.   Pt stated he was not on any of these meds. Pt was advised the other meds on his medication list were ok to to take and have not be documented as a potential drug interaction. Pt will continue as scheduled with the dat scan.

## 2019-09-30 NOTE — Telephone Encounter (Signed)
Pt called to discuss his medications with RN states he was reading over his notes and it advised to check in with provider if any medications could interfere with scan.

## 2019-10-02 ENCOUNTER — Other Ambulatory Visit: Payer: Self-pay

## 2019-10-02 ENCOUNTER — Ambulatory Visit (HOSPITAL_COMMUNITY)
Admission: RE | Admit: 2019-10-02 | Discharge: 2019-10-02 | Disposition: A | Discharge status: Home / Self Care | Payer: PPO | Source: Ambulatory Visit | Attending: Neurology | Admitting: Neurology

## 2019-10-02 DIAGNOSIS — G20 Parkinson's disease: Secondary | ICD-10-CM | POA: Diagnosis not present

## 2019-10-02 DIAGNOSIS — R251 Tremor, unspecified: Secondary | ICD-10-CM | POA: Diagnosis not present

## 2019-10-02 DIAGNOSIS — R269 Unspecified abnormalities of gait and mobility: Secondary | ICD-10-CM | POA: Diagnosis not present

## 2019-10-02 DIAGNOSIS — G20C Parkinsonism, unspecified: Secondary | ICD-10-CM

## 2019-10-02 LAB — GLUCOSE, CAPILLARY: Glucose-Capillary: 120 mg/dL — ABNORMAL HIGH (ref 70–99)

## 2019-10-02 MED ORDER — IOFLUPANE I 123 185 MBQ/2.5ML IV SOLN
4.8000 | Freq: Once | INTRAVENOUS | Status: AC | PRN
  Administered 2019-10-02: 4.8 via INTRAVENOUS
  Filled 2019-10-02: qty 5

## 2019-10-02 MED ORDER — IODINE STRONG (LUGOLS) 5 % PO SOLN
ORAL | Status: AC
  Administered 2019-10-02: 0.8 mL
  Filled 2019-10-02: qty 1

## 2019-10-06 NOTE — Progress Notes (Signed)
These call patient regarding the recent nuclear medicine DaT scan result. As discussed, this is a specialized brain scan designed to help with diagnosis of tremor disorders, including parkinsonian disorders. A radioactive marker gets injected and the uptake is measured in the brain and compared to normal controls and right side is compared to the left. A change in uptake can help with diagnosis of certain tremor disorders and narrow down the diagnostic possibilities. The patient's recent scan indicated abnormal (as in lower) uptake as compared to normal uptake pattern indicating an underlying parkinsonian disorder, including possible Parkinson's disease. The test does not distinguish between Parkinson's disease and other, atypical parkinsonism disorders. As discussed, he is advised to continue with the ropinirole and can FU next month as scheduled.

## 2019-10-08 ENCOUNTER — Telehealth: Payer: Self-pay

## 2019-10-08 NOTE — Telephone Encounter (Signed)
-----   Message from Star Age, MD sent at 10/06/2019 10:00 AM EDT ----- These call patient regarding the recent nuclear medicine DaT scan result. As discussed, this is a specialized brain scan designed to help with diagnosis of tremor disorders, including parkinsonian disorders. A radioactive marker gets injected and the uptake is measured in the brain and compared to normal controls and right side is compared to the left. A change in uptake can help with diagnosis of certain tremor disorders and narrow down the diagnostic possibilities. The patient's recent scan indicated abnormal (as in lower) uptake as compared to normal uptake pattern indicating an underlying parkinsonian disorder, including possible Parkinson's disease. The test does not distinguish between Parkinson's disease and other, atypical parkinsonism disorders. As discussed, he is advised to continue with the ropinirole and can FU next month as scheduled.

## 2019-10-08 NOTE — Telephone Encounter (Signed)
I called the pt and spoke with both the pt and his wife and we reviewed the study.  Pt requested to move his June appt up to further discuss his study with the Doctor, appt moved to 10/14/19 at 1130 am.

## 2019-10-09 DIAGNOSIS — E7800 Pure hypercholesterolemia, unspecified: Secondary | ICD-10-CM | POA: Diagnosis not present

## 2019-10-09 DIAGNOSIS — R7309 Other abnormal glucose: Secondary | ICD-10-CM | POA: Diagnosis not present

## 2019-10-09 DIAGNOSIS — I10 Essential (primary) hypertension: Secondary | ICD-10-CM | POA: Diagnosis not present

## 2019-10-09 DIAGNOSIS — Z0000 Encounter for general adult medical examination without abnormal findings: Secondary | ICD-10-CM | POA: Diagnosis not present

## 2019-10-14 ENCOUNTER — Ambulatory Visit: Payer: PPO | Admitting: Neurology

## 2019-10-14 ENCOUNTER — Other Ambulatory Visit: Payer: Self-pay

## 2019-10-14 ENCOUNTER — Encounter: Payer: Self-pay | Admitting: Neurology

## 2019-10-14 VITALS — BP 155/83 | HR 58 | Ht 66.0 in | Wt 167.0 lb

## 2019-10-14 DIAGNOSIS — Z9181 History of falling: Secondary | ICD-10-CM | POA: Diagnosis not present

## 2019-10-14 DIAGNOSIS — G20 Parkinson's disease: Secondary | ICD-10-CM

## 2019-10-14 DIAGNOSIS — G4752 REM sleep behavior disorder: Secondary | ICD-10-CM | POA: Diagnosis not present

## 2019-10-14 DIAGNOSIS — G20C Parkinsonism, unspecified: Secondary | ICD-10-CM

## 2019-10-14 MED ORDER — CARBIDOPA-LEVODOPA 25-100 MG PO TABS: ORAL_TABLET | ORAL | 5 refills | Status: DC

## 2019-10-14 NOTE — Patient Instructions (Addendum)
Unfortunately you had side effects with the ropinirole.  We may consider a different dopamine agonist down the road in patch form, called the Neupro patch.  Please continue to be proactive about constipation issues.  Remember to drink plenty of fluid at least 6 glasses (8 oz each), eat healthy meals and do not skip any meals. Try to eat protein with a every meal and eat a healthy snack such as fruit or nuts in between meals. Try to keep a regular sleep-wake schedule and try to exercise daily, particularly in the form of walking.  I will make a referral to neuro rehab for evaluation with PT, OT and ST.  Taking your medication on schedule is key.   As far as your medications are concerned, we will start Sinemet (generic name: carbidopa-levodopa) 25/100 mg: Take half a pill twice daily (8 AM and noon) for one week, then half a pill 3 times a day (8 AM, noon, and 4 PM) for one week, then one pill 3 times a day thereafter. Please try to take the medication away from you mealtimes, that is, ideally either one hour before or 2 hours after your meal to ensure optimal absorption. The medication can interfere with the protein content of your meal and trying to the protein in your food and therefore not get fully absorbed.   Common side effects reported are: Nausea, vomiting, sedation, confusion, lightheadedness. Rare side effects include hallucinations, severe nausea or vomiting, diarrhea and significant drop in blood pressure especially when going from lying to standing or from sitting to standing.    You can try Melatonin at night for sleep: take 3 mg, one to 2 hours before your bedtime. You can go up to 5 mg if needed. It is over the counter and comes in pill form, chewable form and spray, if you prefer.    I would like to see you back in 3 months, sooner if we need to. Please call us with any interim questions, concerns, problems, updates or refill requests.  Our phone number is [phone]. We also  have an after hours call service for urgent matters and there is a physician on-call for urgent questions, that cannot wait till the next work day. For any emergencies you know to call 911 or go to the nearest emergency room.   You can email me through my chart and also leave a phone message for Jinny Blossom, my nurse.

## 2019-10-14 NOTE — Progress Notes (Signed)
Subjective:    Patient ID: Matthew [name] is a 69 y.o. male.  HPI     Interim history:   Matthew [name] is a 69 year old right-handed gentleman with an underlying medical history of hypertension, reflux disease, arthritis with status post right total knee replacement, seasonal allergies, anxiety, and prostate cancer, With status post prostate surgery in November 2019 and status post XRT in 2020, on Lupron inj, who presents for Follow-up consultation of his right-sided parkinsonism.  The patient is accompanied by his wife today.  I first met him on 08/05/2019 at the request of his primary care physician, at which time the patient reported an approximately 2 or 2+ year history of right-sided fine motor dyscontrol particularly affect is in his right hand when he was playing the guitar.  His examination was in keeping with parkinsonism.  I suggested we consider medication in the form of a dopamine agonist.  He was advised to proceed with a DaTscan. He called in the interim on 08/20/2019, and was ready to start ropinirole with gradual titration. He had a DaTscan on 10/02/2019 and I reviewed the results: IMPRESSION: Decreased radiotracer accumulation within the LEFT and RIGHT striatum with faint retention of within the heads of the caudate nuclei. The activity is decreased to a point that drug interference should be considered. 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  We called him with the test results.   His wife called in the interim earlier this month because he had side effects with the ropinirole and I advised him to taper off of the medication.  Today, 10/14/19: he reports that as he tapered the ropinirole his side effects improved, as he was on the medication on a lower dose he noticed that he was walking a little bit better.  His wife also noticed some improvement with the medication but he had significant enough side effects to come  off of the medication, he was very sleepy from it and he had significant headaches about 30 to 45 minutes after taking the medication.  His side effects have subsided, he has been off of the medication and most of his symptoms have been about the same but returned after he was completely off of the medication for about a week.  He has not fallen recently thankfully.  He is still in close follow-up with his urologist.  He had urinary incontinence which got worse when he was on the ropinirole.  He continues to have trouble sleeping, sleep is interrupted because of urinary incontinence and nocturia which is about 2-4 times per average night.  He also has ongoing vivid dreams and dream enactment behavior.  Because of all these issues his wife is not able to sleep in the same bed with him.  He tries to stay active but has noticed a weakness in his arms and legs, particularly in the upper body.  He is trying to stay active mentally and physically, likes to read.  He would be open to having some therapy.  In the past, he did well with physical therapy after his knee replacement surgery.    The patient's allergies, current medications, family history, past medical history, past social history, past surgical history and problem list were reviewed and updated as appropriate.   Previously:   08/05/19: (He) reports problems with his fine motor skills and coordination for the past 2+ years.  He feels that his symptoms are primarily noted in the right hand.  He has not  had any tremors but has had difficulty with buttoning and also when playing the guitar.  He has been playing the guitar off and on for over 50 years.  He picked it up again after he retired some 2-1/2 years ago and noticed that he was having trouble.  He has had some issues with arthritis in the hands and attributed most of his difficulty to arthritis.  He has noticed stiffness.  In addition, he has noticed slowness as an it takes him longer to do things.  He  has had some balance problems but no recent fall with the exception of a recent incident at home when he slipped off of a yoga exercise ball, he fell but did not injure himself.  I reviewed your telemedicine note from 03/10/2019 as well as office note from 07/04/2018.  He denies any mood related issues or cognitive issues.  He sleeps fairly well but does have vivid dreams and reports acting out in his dreams from time to time.  Once he fell out of bed, his wife has noted that he mumbles or talks in his sleep and also moves his limbs while asleep.  This has been going on for years as he recalls.  He does not smoke any cigarettes except for some smoking when he was a teenager reported.  He does smoke the pipe, 1 or 2/day.  He drinks alcohol in the form of some liquor, 1-2 servings per day on average.  He drinks caffeine in the form of coffee.  He had a knee replacement surgery last year.  He no longer takes any narcotic pain medication but does take ibuprofen 800 mg 2 or 3 times a day as needed.  He is on Lupron.   His Past Medical History Is Significant For: Past Medical History:  Diagnosis Date  . Anxiety   . Arthritis   . GERD (gastroesophageal reflux disease)    occ remote history  . Hypertension   . [place]state cancer ([place])   . Right knee pain    questionable meniscal tear  . Seasonal allergies     His Past Surgical History Is Significant For: Past Surgical History:  Procedure Laterality Date  . COLONOSCOPY    . Leg Laceration Repair  Left    Age 68  . PELVIC LYMPH NODE DISSECTION Bilateral 03/22/2018   Procedure: PELVIC LYMPH NODE DISSECTION;  Surgeon: Ceasar Mons, MD;  Location: [facility];  Service: Urology;  Laterality: Bilateral;  . PROSTATE BIOPSY    . ROBOT ASSISTED LAPAROSCOPIC RADICAL PROSTATECTOMY N/A 03/22/2018   Procedure: XI ROBOTIC ASSISTED LAPAROSCOPIC PROSTATECTOMY;  Surgeon: Ceasar Mons, MD;  Location: [facility];  Service: Urology;  Laterality: N/A;  .  TOTAL KNEE ARTHROPLASTY Right 05/05/2019   Procedure: RIGHT TOTAL KNEE ARTHROPLASTY;  Surgeon: Frederik Pear, MD;  Location: [facility];  Service: Orthopedics;  Laterality: Right;    His Family History Is Significant For: Family History  Problem Relation Age of Onset  . Breast cancer Mother        27s  . Heart attack Father 55  . Colon cancer Maternal Grandmother   . Prostate cancer Neg Hx   . Pancreatic cancer Neg Hx     His Social History Is Significant For: Social History   Socioeconomic History  . Marital status: Married    Spouse name: Not on file  . Number of children: 2  . Years of education: Not on file  . Highest education level: Not on file  Occupational History  . Not on file  Tobacco Use  . Smoking status: Current Every Day Smoker    Years: 20.00    Types: Pipe  . Smokeless tobacco: Never Used  Substance and Sexual Activity  . Alcohol use: Yes    Alcohol/week: 1.0 - 2.0 standard drinks    Types: 1 - 2 Shots of liquor per week    Comment: per day  . Drug use: Never  . Sexual activity: Not Currently  Other Topics Concern  . Not on file  Social History Narrative  . Not on file   Social Determinants of Health   Financial Resource Strain:   . Difficulty of Paying Living Expenses:   Food Insecurity:   . Worried About Charity fundraiser in the Last Year:   . Arboriculturist in the Last Year:   Transportation Needs:   . Film/video editor (Medical):   Marland Kitchen Lack of Transportation (Non-Medical):   Physical Activity:   . Days of Exercise per Week:   . Minutes of Exercise per Session:   Stress:   . Feeling of Stress :   Social Connections:   . Frequency of Communication with Friends and Family:   . Frequency of Social Gatherings with Friends and Family:   . Attends Religious Services:   . Active Member of Clubs or Organizations:   . Attends Archivist Meetings:   Marland Kitchen Marital Status:     His Allergies Are:  Allergies  Allergen Reactions  .  Other     Senior dose of flu shot, cause hear palpitations, anxious  . Peanut-Containing Drug Products Other (See Comments)    Nasal congestion  :   His Current Medications Are:  Outpatient Encounter Medications as of 10/14/2019  Medication Sig  . cetirizine (ZYRTEC) 10 MG tablet Take 10 mg by mouth every morning.   . fluticasone (FLONASE) 50 MCG/ACT nasal spray Place 1-2 sprays into both nostrils daily as needed for allergies or rhinitis.  Marland Kitchen ibuprofen (ADVIL) 800 MG tablet Take 800 mg by mouth in the morning and at bedtime.  Marland Kitchen leuprolide, 6 Month, (ELIGARD) 45 MG injection Inject 45 mg into the skin every 6 (six) months.  Marland Kitchen lisinopril (ZESTRIL) 20 MG tablet Take 20 mg by mouth daily.  . [DISCONTINUED] UNABLE TO FIND Med Name: Elligard  . [DISCONTINUED] Leuprolide Acetate, 6 Month, (LUPRON DEPOT, 36-MONTH, IM) Inject into the muscle.  . [DISCONTINUED] rOPINIRole (REQUIP) 0.25 MG tablet Take one pill twice daily (morning and at lunchtime) for one week, then one pill 3 times a day (morning, lunch and evening for one week), then 2 pills 3 times a day for one week, then 3 pills 3 times a day thereafter.   No facility-administered encounter medications on file as of 10/14/2019.  :  Review of Systems:  Out of a complete 14 point review of systems, all are reviewed and negative with the exception of these symptoms as listed below: Review of Systems  Neurological:       Pt presents today to discuss his DaT scan results and treatment. The requip gave him a headache, drowsiness, and trouble urinating at the maintenance dose. However, when titrating down from the maintenance dose he noticed that his PD symptoms improved but the PD symptoms returned when he was completely off requip.    Objective:  Neurological Exam  Physical Exam Physical Examination:   Vitals:   10/14/19 1146  BP: (!) 155/83  Pulse: (!) 58  General Examination: The patient is a very pleasant 69 y.o. male in no acute  distress. He appears well-developed and well-nourished and well groomed.   HEENT: Normocephalic, atraumatic, pupils are equal, round and reactive to light and accommodation. Mild cataracts are noted bilaterally, face is symmetric with mild facial masking noted, he has moderate nuchal rigidity.  Extraocular tracking shows mild saccadic breakdown, no nystagmus noted.  Hearing is grossly intact, mild decrease in eye blink rate is noted.  Slight leaning of the upper body to the right when sitting in the chair.  No lip, neck or jaw tremor noted, no dysarthria, perhaps mild hypophonia at slowness in speech. Oropharynx exam reveals: mild mouth dryness, adequate dental hygiene. tongue protrudes centrally and palate elevates symmetrically.   Chest: Clear to auscultation without wheezing, rhonchi or crackles noted.  Heart: S1+S2+0, regular and normal without murmurs, rubs or gallops noted.   Abdomen: Soft, non-tender and non-distended with normal bowel sounds appreciated on auscultation.  Extremities: There is trace pitting edema in the R ankle, stable.   Skin: Warm and dry without trophic changes noted.  Musculoskeletal: exam reveals mild R knee swelling, stable.  Neurologically:  Mental status: The patient is awake, alert and oriented in all 4 spheres. His immediate and remote memory, attention, language skills and fund of knowledge are appropriate. There is no evidence of aphasia, agnosia, apraxia or anomia. Speech is clear with normal prosody and enunciation. Thought process is linear. Mood is normal and affect is normal.  Cranial nerves II - XII are as described above under HEENT exam. In addition: shoulder shrug is normal with equal shoulder height noted. Motor exam: Normal bulk, strength. There is no drift, resting, postural or action tremor.    (On 08/05/2019: On Archimedes spiral drawing he has no significant difficulty with the right hand, slight insecurity noted with the left hand,  handwriting is legible for the most part but micrographic.)  Fine motor skills and coordination:  He has fine motor difficulty bilaterally, right more than left, right more than the moderate range.  He stands up with mild difficulty and pushes himself up.  Posture is fairly age-appropriate, he walks with decreased arm swing bilaterally, somewhat decreased pace, no obvious shuffling, no freezing, mild difficulty with turns.   Cerebellar testing: No dysmetria or intention tremor. There is no truncal or gait ataxia.  Sensory exam: intact to light touch in the upper and lower extremities.   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  He had a DaTscan which was supportive of an underlying parkinsonian syndrome.  He had some benefit from taking ropinirole but the side effects outweighed the benefit, he had significant exacerbation of his urinary incontinence, significant somnolence from it and headaches.  Most of his side effects improved completely after he came off of the medication but he still has issues with urinary incontinence.  He is on Lupron injections.  He is in close follow-up with urology.  Constipation has been an issue but he is proactive about it and takes Colace twice daily and  Metamucil as needed.  He is advised to stay well-hydrated and continue to try to stay active physically.  He tries to stay mentally active.  I suggested a referral to neuro rehab to get evaluations through PT,  OT and ST.  He may benefit from some therapy but we will await evaluation results.  He is open to this.  As far as medications, he has been off of the ropinirole and I suggested we start low-dose Sinemet with gradual titration.  We talked about this medication, expectations, benefits and common side effects.  He was given a new prescription and written instructions.  He is advised to follow-up routinely in 3 months, sooner if needed.   His history is also supportive of dream enactment behavior intermittently for the past years.  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  He is advised to try melatonin.  I answered all the questions today the patient and his wife are in agreement.   I spent 40 minutes in total face-to-face time and in reviewing records during pre-charting, more than 50% of which was spent in counseling and coordination of care, reviewing test results, reviewing medications and treatment regimen and/or in discussing or reviewing the diagnosis of PD, the prognosis and treatment options. Pertinent laboratory and imaging test results that were available during this visit with the patient were reviewed by me and considered in my medical decision making (see chart for details).

## 2019-10-30 ENCOUNTER — Ambulatory Visit: Payer: PPO | Attending: Neurology | Admitting: Physical Therapy

## 2019-10-30 ENCOUNTER — Other Ambulatory Visit: Payer: Self-pay

## 2019-10-30 DIAGNOSIS — R293 Abnormal posture: Secondary | ICD-10-CM | POA: Insufficient documentation

## 2019-10-30 DIAGNOSIS — M6281 Muscle weakness (generalized): Secondary | ICD-10-CM | POA: Insufficient documentation

## 2019-10-30 DIAGNOSIS — N3946 Mixed incontinence: Secondary | ICD-10-CM | POA: Diagnosis not present

## 2019-10-30 DIAGNOSIS — R2681 Unsteadiness on feet: Secondary | ICD-10-CM | POA: Insufficient documentation

## 2019-10-30 DIAGNOSIS — C61 Malignant neoplasm of prostate: Secondary | ICD-10-CM | POA: Diagnosis not present

## 2019-10-30 DIAGNOSIS — R2689 Other abnormalities of gait and mobility: Secondary | ICD-10-CM | POA: Diagnosis not present

## 2019-10-30 DIAGNOSIS — R29818 Other symptoms and signs involving the nervous system: Secondary | ICD-10-CM | POA: Insufficient documentation

## 2019-10-30 NOTE — Therapy (Signed)
[facility] [address] [place], [place], [address] Phone: [phone]   Fax:  [phone]  Physical Therapy Evaluation  Patient Details  Name: Matthew [name] MRN: [id number] Date of Birth: 30-Dec-1950 Referring Provider (PT): Star Age   Encounter Date: 10/30/2019   PT End of Session - 10/30/19 1640    Visit Number 1    Number of Visits 17    Date for PT Re-Evaluation 81/85/63   90 day cert due to delay in start of scheduling, 8-wk POC   Authorization Type [employer]    Progress Note Due on Visit 10    PT Start Time 1022    PT Stop Time 1115    PT Time Calculation (min) 53 min    Activity Tolerance Patient tolerated treatment well    Behavior During Therapy [facility] for tasks assessed/performed           Past Medical History:  Diagnosis Date  . Anxiety   . Arthritis   . GERD (gastroesophageal reflux disease)    occ remote history  . Hypertension   . Prostate cancer ([place])   . Right knee pain    questionable meniscal tear  . Seasonal allergies     Past Surgical History:  Procedure Laterality Date  . COLONOSCOPY    . Leg Laceration Repair  Left    Age 69  . PELVIC LYMPH NODE DISSECTION Bilateral 03/22/2018   Procedure: PELVIC LYMPH NODE DISSECTION;  Surgeon: Ceasar Mons, MD;  Location: [facility];  Service: Urology;  Laterality: Bilateral;  . PROSTATE BIOPSY    . ROBOT ASSISTED LAPAROSCOPIC RADICAL PROSTATECTOMY N/A 03/22/2018   Procedure: XI ROBOTIC ASSISTED LAPAROSCOPIC PROSTATECTOMY;  Surgeon: Ceasar Mons, MD;  Location: [facility];  Service: Urology;  Laterality: N/A;  . TOTAL KNEE ARTHROPLASTY Right 05/05/2019   Procedure: RIGHT TOTAL KNEE ARTHROPLASTY;  Surgeon: Frederik Pear, MD;  Location: [facility];  Service: Orthopedics;  Laterality: Right;    There were no vitals filed for this visit.    Subjective Assessment - 10/30/19 1026    Subjective Pt with recent diagnosis of  Parkinson's disease-got the smaller handwriting, buttons/dressing/tying shoes.  Takes forever to take off shirts; began several years ago with difficulty playing guitar.  Also note weakness in UEs, shufffling with walking.  Probably R side more affected.  Noticing some balance changes.    Patient is accompained by: Family member   wife   Pertinent History hx of prostate cancer/removal, incontinence, hx of R TKR 04/2019    Patient Stated Goals Pt's goal for therapy is to improve walking, to have strength and be normal again.  Wants to get back to playing guitar.    Currently in Pain? No/denies   Occasionally has back pain             [facility] PT Assessment - 10/30/19 1034      Assessment   Medical Diagnosis Primary Parkinsonism    Referring Provider (PT) Rexene Alberts, Eunice Blase    Onset Date/Surgical Date 10/14/19    Hand Dominance Right    Prior Therapy Had PT following TKR 04/2019      Precautions   Precautions Fall      Balance Screen   Has the patient fallen in the past 6 months Yes    How many times? 1    Has the patient had a decrease in activity level because of a fear of falling?  No    Is the patient reluctant  to leave their home because of a fear of falling?  No      Home Social worker Private residence    Living Arrangements Spouse/significant other    Available Help at Discharge Family    Type of [place] to enter    Entrance Stairs-Number of Steps 2-3    [name] Two level;Able to live on main level with bedroom/bathroom    [place] - 2 wheels;Cane - single point;Walker - 4 wheels;Grab bars - toilet;Grab bars - tub/shower;Tub bench   (borrowed/passed on from family member); walk-inshower     Prior Function   Level of Independence Independent    Vocation Retired    Leisure Enjoys being with guy friends playing horsehoes and darts; does HEP from post TKR; enjoys going to the pool,  spending time with grandchildren, enjoys Proofreader.  Sometimes walks in the neighborhood.      Observation/Other Assessments   Focus on Therapeutic Outcomes (FOTO)  NA      Posture/Postural Control   Posture/Postural Control Postural limitations    Postural Limitations Rounded Shoulders;Forward head;Posterior pelvic tilt;Flexed trunk      ROM / Strength   AROM / PROM / Strength Strength;AROM      AROM   Overall AROM  Deficits    Overall AROM Comments decreased active ROM hamstrings bilaterally (measured in sitting-R knee extension lacks 20 degrees, L knee extnesion lacks 22 degrees)      Strength   Overall Strength Deficits    Overall Strength Comments Grossly tested at least 4/5 bilateral lower extremities      Transfers   Transfers Sit to Stand;Stand to Sit    Sit to Stand 5: Supervision;Without upper extremity assist;From chair/3-in-1    Sit to Stand Details (indicate cue type and reason) Pt noted to have posterior lean on 4th attempt of sit<>stand    Five time sit to stand comments  10.69    Stand to Sit 5: Supervision;Without upper extremity assist;To chair/3-in-1    Comments Reports overall slowed ability to get up, especially after sitting long periods.      Ambulation/Gait   Ambulation/Gait Yes    Ambulation/Gait Assistance 5: Supervision    Ambulation Distance (Feet) 200 Feet    Assistive device None    Gait Pattern Step-through pattern;Decreased arm swing - right;Decreased arm swing - left;Decreased step length - right;Decreased step length - left;Right flexed knee in stance;Left flexed knee in stance;Decreased trunk rotation;Trunk flexed    Ambulation Surface Level;Indoor    Gait velocity 13.15 sec = 2.5 ft/sec     Stairs Yes    Stairs Assistance 6: Modified independent (Device/Increase time)    Stair Management Technique One rail Right;Alternating pattern;Forwards    Number of Stairs 4    Height of Stairs 6      Standardized Balance Assessment    Standardized Balance Assessment Timed Up and Go Test;Mini-BESTest      Mini-BESTest   Sit To Stand Normal: Comes to stand without use of hands and stabilizes independently.    Rise to Toes Normal: Stable for 3 s with maximum height.    Stand on one leg (left) Moderate: < 20 s   5.15, 5.59   Stand on one leg (right) Normal: 20 s.   7.35, 20   Stand on one leg - lowest score 1    Compensatory Stepping Correction - Forward Normal: Recovers independently  with a single, large step (second realignement is allowed).    Compensatory Stepping Correction - Backward No step, OR would fall if not caught, OR falls spontaneously.    Compensatory Stepping Correction - Left Lateral Normal: Recovers independently with 1 step (crossover or lateral OK)    Compensatory Stepping Correction - Right Lateral Normal: Recovers independently with 1 step (crossover or lateral OK)    Stepping Corredtion Lateral - lowest score 2    Stance - Feet together, eyes open, firm surface  Normal: 30s    Stance - Feet together, eyes closed, foam surface  Normal: 30s    Incline - Eyes Closed Normal: Stands independently 30s and aligns with gravity    Change in Gait Speed Moderate: Unable to change walking speed or signs of imbalance    Walk with head turns - Horizontal Moderate: performs head turns with reduction in gait speed.    Walk with pivot turns Moderate:Turns with feet close SLOW (>4 steps) with good balance.    Step over obstacles Moderate: Steps over box but touches box OR displays cautious behavior by slowing gait.    Timed UP & GO with Dual Task Moderate: Dual Task affects either counting OR walking (>10%) when compared to the TUG without Dual Task.    Mini-BEST total score 20      Timed Up and Go Test   Normal TUG (seconds) 13.13    Manual TUG (seconds) 14.06    Cognitive TUG (seconds) 16.41    TUG Comments Scores >13.5-15 seconds indicate increased fall risk, >10% difference indicates difficulty with dual tasking                       Objective measurements completed on examination: See above findings.               PT Education - 10/30/19 1637    Education Details POC, Eval results    Person(s) Educated Patient;Spouse    Methods Explanation    Comprehension Verbalized understanding            PT Short Term Goals - 10/30/19 1655      PT SHORT TERM GOAL #1   Title Pt will be indepedent with Parkinson's specific HEP to address balance, transfers, gait for improved overall mobility.  TARGET 12/26/2019 (delayed date due to delayed start-Pt wants OT, PT, speech together)    Time 4    Period Weeks    Status New    Target Date 12/26/19      PT SHORT TERM GOAL #2   Title Pt will perform at least 8 of 10 reps of sit<>stand transfers with no posterior lean or LOB.    Time 4    Period Weeks    Status New    Target Date 12/26/19      PT SHORT TERM GOAL #3   Title Pt will improve posterior push and release test to recover in less than 2 steps, for improved balance recovery.    Time 4    Period Weeks    Status New    Target Date 12/26/19      PT SHORT TERM GOAL #4   Title Pt will verbalize understanding of fall prevention education in home environment.    Time 4    Period Weeks    Status New    Target Date 12/26/19             PT Long Term Goals - 10/30/19 1659  PT LONG TERM GOAL #1   Title Pt will be independent with progression of HEP for Parkinson's related deficits.  TARGET 01/23/2020 (delayed due to delayed start for coordination of schedules)    Time 8    Period Weeks    Status New    Target Date 01/23/20      PT LONG TERM GOAL #2   Title Pt will improve gait velocity to at least 2.8 ft/sec for improved gait efficiency and safety.    Baseline 2.5 ft/sec    Time 8    Period Weeks    Status New    Target Date 01/23/20      PT LONG TERM GOAL #3   Title Pt will improve MiniBESTest score to at least 23/28 for decreased fall risk.    Time 8     Period Weeks    Status New    Target Date 12/26/19      PT LONG TERM GOAL #4   Title Pt will improve TUG/TUG cognitive score to less than 10% difference for improved ability with dual tasking with gait.    Time 8    Period Weeks    Status New    Target Date 01/23/20      PT LONG TERM GOAL #5   Title Pt/wife will verbalize understanding of local Parkinson's disease-related resources, including ongoing community fitness.    Time 8    Period Weeks    Status New    Target Date 01/23/20                  Plan - 10/30/19 1641    Clinical Impression Statement Pt is a 69 year old male who presents to OPPT with recent dx of Parkinson's disease.  He has recently had medication change from Ropinorole to Sinemet and pt's first full dose of Sinemet is today (wife reports significant improvement in movement patterns since starting Sinemet).  Pt presents with decreased timing and coordination of gait, decreased balance, postural instability (pt would fall if unaided in posterior push and release with multiple small steps), abnormal posture, decreased strength, decreased flexibility.  He is at fall risk per MiniBESTest and TUG scores.  He has had one fall in the past 6 months.  Pt enjoys spending time with friends and grandchildren.  He would benefit from skilled PT to address the above stated deficits for decreased fall risk and improved overall functional mobility.    Personal Factors and Comorbidities Comorbidity 3+    Comorbidities anxiety, arthritis, GERD, prostate cancer, HTN, s/p R TKR 04/2019    Examination-Activity Limitations Locomotion Level;Transfers;Stand;Stairs    Examination-Participation Restrictions Community Activity;Other   playing with grandchildren   Stability/Clinical Decision Making Evolving/Moderate complexity    Clinical Decision Making Moderate    Rehab Potential Good    PT Frequency 2x / week    PT Duration 8 weeks   plus eval   PT Treatment/Interventions ADLs/Self  Care Home Management;Gait training;Functional mobility training;Therapeutic activities;Therapeutic exercise;Balance training;Neuromuscular re-education;DME Instruction;Patient/family education    PT Next Visit Plan Initiate HEP for PWR! Moves, balance, posture, gait with arm swing; balance reactions especially in posterior direction    Consulted and Agree with Plan of Care Patient;Family member/caregiver    Family Member Consulted wife           Patient will benefit from skilled therapeutic intervention in order to improve the following deficits and impairments:  Abnormal gait, Difficulty walking, Decreased balance, Decreased mobility, Postural dysfunction, Decreased strength, Impaired  flexibility  Visit Diagnosis: Other abnormalities of gait and mobility  Unsteadiness on feet  Muscle weakness (generalized)  Abnormal posture  Other symptoms and signs involving the nervous system     Problem List Patient Active Problem List   Diagnosis Date Noted  . S/P TKR (total knee replacement), right 05/05/2019  . Osteoarthritis of right knee 05/02/2019  . Prostate cancer ([place]) 03/22/2018  . Malignant neoplasm of prostate ([place]) 02/27/2018    MARRIOTT,AMY W. 10/30/2019, 5:04 PM Frazier Butt., PT  [facility] [address] [place], [place], [address] Phone: [phone]   Fax:  [phone]  Name: Dwaine Pringle MRN: [id number] Date of Birth: 1951-03-01

## 2019-11-04 DIAGNOSIS — Z96651 Presence of right artificial knee joint: Secondary | ICD-10-CM | POA: Diagnosis not present

## 2019-11-04 DIAGNOSIS — Z471 Aftercare following joint replacement surgery: Secondary | ICD-10-CM | POA: Diagnosis not present

## 2019-11-05 ENCOUNTER — Ambulatory Visit: Payer: PPO | Admitting: Neurology

## 2019-11-10 DIAGNOSIS — I10 Essential (primary) hypertension: Secondary | ICD-10-CM | POA: Diagnosis not present

## 2019-11-10 DIAGNOSIS — G20 Parkinson's disease: Secondary | ICD-10-CM | POA: Diagnosis not present

## 2019-11-10 DIAGNOSIS — M19049 Primary osteoarthritis, unspecified hand: Secondary | ICD-10-CM | POA: Diagnosis not present

## 2019-11-10 DIAGNOSIS — E7800 Pure hypercholesterolemia, unspecified: Secondary | ICD-10-CM | POA: Diagnosis not present

## 2019-11-10 DIAGNOSIS — C61 Malignant neoplasm of prostate: Secondary | ICD-10-CM | POA: Diagnosis not present

## 2019-11-11 ENCOUNTER — Telehealth: Payer: Self-pay | Admitting: Neurology

## 2019-11-11 NOTE — Telephone Encounter (Signed)
He does not recall whether or not he took his Sinemet at 5 PM.  His current dose is 1 pill 3 times a day.  I advised him to take 1/2 pill now and continue taking the medications on the regular schedule.  I reassured him that if he ended up taking 1/2 pill glass or 1/2 pill more than his standard dose that it would not cause problems.

## 2019-11-12 ENCOUNTER — Ambulatory Visit: Payer: PPO | Admitting: Neurology

## 2019-11-18 DIAGNOSIS — C61 Malignant neoplasm of prostate: Secondary | ICD-10-CM | POA: Diagnosis not present

## 2019-11-18 DIAGNOSIS — N3946 Mixed incontinence: Secondary | ICD-10-CM | POA: Diagnosis not present

## 2019-12-02 ENCOUNTER — Encounter: Payer: Self-pay | Admitting: Physical Therapy

## 2019-12-02 ENCOUNTER — Ambulatory Visit: Payer: PPO | Attending: Neurology | Admitting: Speech Pathology

## 2019-12-02 ENCOUNTER — Ambulatory Visit: Payer: PPO | Admitting: Occupational Therapy

## 2019-12-02 ENCOUNTER — Ambulatory Visit: Payer: PPO | Admitting: Physical Therapy

## 2019-12-02 ENCOUNTER — Other Ambulatory Visit: Payer: Self-pay

## 2019-12-02 DIAGNOSIS — R29818 Other symptoms and signs involving the nervous system: Secondary | ICD-10-CM

## 2019-12-02 DIAGNOSIS — R278 Other lack of coordination: Secondary | ICD-10-CM

## 2019-12-02 DIAGNOSIS — R293 Abnormal posture: Secondary | ICD-10-CM | POA: Insufficient documentation

## 2019-12-02 DIAGNOSIS — R2681 Unsteadiness on feet: Secondary | ICD-10-CM | POA: Diagnosis not present

## 2019-12-02 DIAGNOSIS — R2689 Other abnormalities of gait and mobility: Secondary | ICD-10-CM | POA: Insufficient documentation

## 2019-12-02 DIAGNOSIS — R1310 Dysphagia, unspecified: Secondary | ICD-10-CM | POA: Insufficient documentation

## 2019-12-02 DIAGNOSIS — M6281 Muscle weakness (generalized): Secondary | ICD-10-CM

## 2019-12-02 DIAGNOSIS — R471 Dysarthria and anarthria: Secondary | ICD-10-CM | POA: Insufficient documentation

## 2019-12-02 DIAGNOSIS — R4701 Aphasia: Secondary | ICD-10-CM | POA: Insufficient documentation

## 2019-12-02 NOTE — Patient Instructions (Addendum)
    Perform 1-2x per day. 10 reps of each.   Access Code: [email or web address] URL: [email or web address] Date: 12/02/2019 Prepared by: Janann August  Exercises Seated Hamstring Stretch - 1 x daily - 5 x weekly - 2 sets - 30 hold

## 2019-12-02 NOTE — Therapy (Signed)
[facility] [address] [place], [place], [address] Phone: [phone]   Fax:  [phone]  Physical Therapy Treatment  Patient Details  Name: Matthew [name] MRN: [id number] Date of Birth: 1950-08-13 Referring Provider (PT): Star Age   Encounter Date: 12/02/2019   PT End of Session - 12/02/19 1018    Visit Number 2    Number of Visits 17    Date for PT Re-Evaluation 26/94/85   90 day cert due to delay in start of scheduling, 8-wk POC   Authorization Type [employer]    Progress Note Due on Visit 10    PT Start Time 0933    PT Stop Time 1015    PT Time Calculation (min) 42 min    Activity Tolerance Patient tolerated treatment well    Behavior During Therapy [facility] for tasks assessed/performed           Past Medical History:  Diagnosis Date  . Anxiety   . Arthritis   . GERD (gastroesophageal reflux disease)    occ remote history  . Hypertension   . Prostate cancer ([place])   . Right knee pain    questionable meniscal tear  . Seasonal allergies     Past Surgical History:  Procedure Laterality Date  . COLONOSCOPY    . Leg Laceration Repair  Left    Age 69  . PELVIC LYMPH NODE DISSECTION Bilateral 03/22/2018   Procedure: PELVIC LYMPH NODE DISSECTION;  Surgeon: Ceasar Mons, MD;  [facility];  Service: Urology;  Laterality: Bilateral;  . PROSTATE BIOPSY    . ROBOT ASSISTED LAPAROSCOPIC RADICAL PROSTATECTOMY N/A 03/22/2018   Procedure: XI ROBOTIC ASSISTED LAPAROSCOPIC PROSTATECTOMY;  Surgeon: Ceasar Mons, MD;  [facility];  Service: Urology;  Laterality: N/A;  . TOTAL KNEE ARTHROPLASTY Right 05/05/2019   Procedure: RIGHT TOTAL KNEE ARTHROPLASTY;  Surgeon: Frederik Pear, MD;  [facility];  Service: Orthopedics;  Laterality: Right;    There were no vitals filed for this visit.   Subjective Assessment - 12/02/19 0935    Subjective No changes. No falls, has had  some wavering. Also reports that neck gets stiff sometimes.    Patient is accompained by: Family member   wife   Pertinent History hx of prostate cancer/removal, incontinence, hx of R TKR 04/2019    Patient Stated Goals Pt's goal for therapy is to improve walking, to have strength and be normal again.  Wants to get back to playing guitar.    Currently in Pain? No/denies                        Pt performs PWR! Moves in standing - provided as HEP.    PWR! Up for improved posture 2 x10 reps B -cues to lower arms to decr L shoulder discomfort   PWR! [name] for improved weighshifting 2 x 5 reps B - cues to look at hand   PWR! Twist for improved trunk rotation 2 x 5 reps B -cues for intensity with clap and to look at hand   PWR! Step for improved step initiation 2 x 5 reps B. - cues for incr foot clearance   Cues provided for intensity, technique. Cues to perform with "big hands" throughout        Access Code: [email or web address] URL: [email or web address] Date: 12/02/2019 Prepared by: Janann August  Exercises Seated Hamstring Stretch - 1 x daily - 5 x weekly - 2 sets -  30 hold   [facility] Adult PT Treatment/Exercise - 12/02/19 1010      Ambulation/Gait   Ambulation/Gait Yes    Ambulation/Gait Assistance 5: Supervision    Ambulation/Gait Assistance Details initial cues with heel strike and incr step length B, pt trying initially to coordinate arm swing, but unable to perform in reciprocal pattern, performed x2 laps with therapist posteriorly facilitating B arm swing and trunk rotation with walking poles with pt focusing on larger step length and heel strike, with removal of walking poles, pt demonstrating improvement of reciprocal arm swing and posture. pt with B knee flexion during gait throughout     Ambulation Distance (Feet) 460 Feet    Assistive device None    Gait Pattern Step-through pattern;Decreased arm swing - right;Decreased arm swing - left;Decreased step  length - right;Decreased step length - left;Right flexed knee in stance;Left flexed knee in stance;Decreased trunk rotation;Trunk flexed    Ambulation Surface Level;Indoor               Balance Exercises - 12/02/19 0001      Balance Exercises: Standing   Stepping Strategy Posterior;10 reps;Limitations    Stepping Strategy Limitations cues for weight shifting and foot clearance             PT Education - 12/02/19 1017    Education Details initial HEP - standing PWR moves and seated hamstring stretch    Person(s) Educated Patient    Methods Explanation;Demonstration;Handout;Verbal cues    Comprehension Verbalized understanding;Returned demonstration            PT Short Term Goals - 10/30/19 1655      PT SHORT TERM GOAL #1   Title Pt will be indepedent with Parkinson's specific HEP to address balance, transfers, gait for improved overall mobility.  TARGET 12/26/2019 (delayed date due to delayed start-Pt wants OT, PT, speech together)    Time 4    Period Weeks    Status New    Target Date 12/26/19      PT SHORT TERM GOAL #2   Title Pt will perform at least 8 of 10 reps of sit<>stand transfers with no posterior lean or LOB.    Time 4    Period Weeks    Status New    Target Date 12/26/19      PT SHORT TERM GOAL #3   Title Pt will improve posterior push and release test to recover in less than 2 steps, for improved balance recovery.    Time 4    Period Weeks    Status New    Target Date 12/26/19      PT SHORT TERM GOAL #4   Title Pt will verbalize understanding of fall prevention education in home environment.    Time 4    Period Weeks    Status New    Target Date 12/26/19             PT Long Term Goals - 10/30/19 1659      PT LONG TERM GOAL #1   Title Pt will be independent with progression of HEP for Parkinson's related deficits.  TARGET 01/23/2020 (delayed due to delayed start for coordination of schedules)    Time 8    Period Weeks    Status New     Target Date 01/23/20      PT LONG TERM GOAL #2   Title Pt will improve gait velocity to at least 2.8 ft/sec for improved gait efficiency and safety.  Baseline 2.5 ft/sec    Time 8    Period Weeks    Status New    Target Date 01/23/20      PT LONG TERM GOAL #3   Title Pt will improve MiniBESTest score to at least 23/28 for decreased fall risk.    Time 8    Period Weeks    Status New    Target Date 12/26/19      PT LONG TERM GOAL #4   Title Pt will improve TUG/TUG cognitive score to less than 10% difference for improved ability with dual tasking with gait.    Time 8    Period Weeks    Status New    Target Date 01/23/20      PT LONG TERM GOAL #5   Title Pt/wife will verbalize understanding of local Parkinson's disease-related resources, including ongoing community fitness.    Time 8    Period Weeks    Status New    Target Date 01/23/20                 Plan - 12/02/19 1421    Clinical Impression Statement Focus of today's skilled session was initiating standing PWR moves for HEP, gait training, and posterior stepping strategies. Pt able to pick up standing PWR moves pretty quickly with proper tehcnique and intensity with initial demo and verbal cues, able to perfrom all without UE support. Pt able to demo improved reciprocal arm swing after use of walking sticks (with therapist helping to facilitate arm swing and trunk rotation), however pt does have incr B knee flexion during gait. Will continue to progress towards LTGs.    Personal Factors and Comorbidities Comorbidity 3+    Comorbidities anxiety, arthritis, GERD, prostate cancer, HTN, s/p R TKR 04/2019    Examination-Activity Limitations Locomotion Level;Transfers;Stand;Stairs    Examination-Participation Restrictions Community Activity;Other   playing with grandchildren   Stability/Clinical Decision Making Evolving/Moderate complexity    Rehab Potential Good    PT Frequency 2x / week    PT Duration 8 weeks   plus  eval   PT Treatment/Interventions ADLs/Self Care Home Management;Gait training;Functional mobility training;Therapeutic activities;Therapeutic exercise;Balance training;Neuromuscular re-education;DME Instruction;Patient/family education    PT Next Visit Plan review standing PWR moves for HEP. balance, posture, gait with arm swing; balance reactions especially in posterior direction    PT Home Exercise Plan standing PWR moves    Consulted and Agree with Plan of Care Patient;Family member/caregiver    Family Member Consulted wife           Patient will benefit from skilled therapeutic intervention in order to improve the following deficits and impairments:  Abnormal gait, Difficulty walking, Decreased balance, Decreased mobility, Postural dysfunction, Decreased strength, Impaired flexibility  Visit Diagnosis: Other abnormalities of gait and mobility  Unsteadiness on feet  Muscle weakness (generalized)  Abnormal posture  Other symptoms and signs involving the nervous system     Problem List Patient Active Problem List   Diagnosis Date Noted  . S/P TKR (total knee replacement), right 05/05/2019  . Osteoarthritis of right knee 05/02/2019  . Prostate cancer ([place]) 03/22/2018  . Malignant neoplasm of prostate ([place]) 02/27/2018    Arliss Journey, PT, DPT  12/02/2019, 2:23 PM  [facility] [address] [place], [place], [address] Phone: [phone]   Fax:  [phone]  Name: Matthew [name] MRN: [id number] Date of Birth: 1950/09/23

## 2019-12-02 NOTE — Patient Instructions (Signed)
Speech homework  Loud "Ahhhhh!" 5 times, twice a day. Use your belly.    Read your everyday phrases LOUD, same effort (8/10)  1. What's for dinner?  2. What do you want to watch?  3.  4.    5.   6.   7.   8.   9.  10.

## 2019-12-03 NOTE — Therapy (Signed)
[facility] [address] [place], [place], [address] Phone: [phone]   Fax:  [phone]  Occupational Therapy Evaluation  Patient Details  Name: Matthew [name] MRN: [id number] Date of Birth: 1951/02/24 Referring Provider (OT): Dr. Rexene Alberts   Encounter Date: 12/02/2019   OT End of Session - 12/03/19 1359    Visit Number 1    Number of Visits 25    Date for OT Re-Evaluation 03/02/20    Authorization Type [employer]    Authorization Time Period POC written for 12 weeks , pt goes on vacation after 4 weeks and may need to resume after pt returns- may d/c after 4-8 weeks though    Authorization - Visit Number 1    Authorization - Number of Visits 10    OT Start Time 1020    OT Stop Time 1100    OT Time Calculation (min) 40 min           Past Medical History:  Diagnosis Date  . Anxiety   . Arthritis   . GERD (gastroesophageal reflux disease)    occ remote history  . Hypertension   . Prostate cancer ([place])   . Right knee pain    questionable meniscal tear  . Seasonal allergies     Past Surgical History:  Procedure Laterality Date  . COLONOSCOPY    . Leg Laceration Repair  Left    Age 69  . PELVIC LYMPH NODE DISSECTION Bilateral 03/22/2018   Procedure: PELVIC LYMPH NODE DISSECTION;  Surgeon: Ceasar Mons, MD;  Location: [facility];  Service: Urology;  Laterality: Bilateral;  . PROSTATE BIOPSY    . ROBOT ASSISTED LAPAROSCOPIC RADICAL PROSTATECTOMY N/A 03/22/2018   Procedure: XI ROBOTIC ASSISTED LAPAROSCOPIC PROSTATECTOMY;  Surgeon: Ceasar Mons, MD;  Location: [facility];  Service: Urology;  Laterality: N/A;  . TOTAL KNEE ARTHROPLASTY Right 05/05/2019   Procedure: RIGHT TOTAL KNEE ARTHROPLASTY;  Surgeon: Frederik Pear, MD;  Location: [facility];  Service: Orthopedics;  Laterality: Right;    There were no vitals filed for this visit.   Subjective Assessment - 12/03/19 1339    Subjective  Pt  newly diagnosed with PD    Pertinent History PMH:PD newly diagnosed , anxiety, arthritis, GERD, prostate cancer, HTN, s/p R TKR 04/2019    Patient Stated Goals to play guitar again    Currently in Pain? Yes    Pain Score 3     Pain Location Shoulder    Pain Orientation Left    Pain Descriptors / Indicators Aching    Pain Type Chronic pain    Pain Onset More than a month ago    Pain Frequency Intermittent    Aggravating Factors  malpositioning    Pain Relieving Factors repositioning             [facility] OT Assessment - 12/03/19 0001      Assessment   Medical Diagnosis Primary Parkinsonism    Referring Provider (OT) Dr. Rexene Alberts    Onset Date/Surgical Date 10/14/19    Hand Dominance Right    Prior Therapy Had PT following TKR 04/2019      Precautions   Precautions Fall      Balance Screen   Has the patient fallen in the past 6 months No    Has the patient had a decrease in activity level because of a fear of falling?  No      Home  Environment   Family/patient expects to be discharged  to: Private residence    Home Access Stairs    Home Layout Two level    Bathroom Building control surveyor    Lives With Spouse      Prior Function   Level of Independence Independent    Vocation Retired    Leisure Enjoys being with guy friends playing horsehoes and darts; does HEP from post TKR; enjoys going to the pool, spending time with grandchildren, enjoys Proofreader.  Sometimes walks in the neighborhood.      ADL   Eating/Feeding Modified independent   increased spills, difficulty cutting meat, scooping   Grooming Modified independent   has electric toothbrush, doing better   Upper Body Bathing Modified independent   increased time   Lower Body Bathing Increased time    Upper Body Dressing Needs assist for fasteners;Minimal assistance   difficulty getting shirt and jacket on and off   Lower Body Dressing Modified independent    Toilet Transfer Modified independent    Tub/Shower  Transfer Modified independent    ADL comments Difficulty reaching behind back for toileting with RUE, diffiulcty pulling t-shirt off, unable to play guitar currently      IADL   Shopping Shops independently for small purchases    Light Housekeeping Performs light daily tasks such as dishwashing, bed making    Meal Prep Able to complete simple cold meal and snack prep    Medication Management Is responsible for taking medication in correct dosages at correct time    Financial Management Dependent   Pt's wife handled before     Mobility   Mobility Status Independent      Written Expression   Handwriting Mild micrographia;100% legible      Cognition   Overall Cognitive Status Within Functional Limits for tasks assessed      Observation/Other Assessments   Focus on Therapeutic Outcomes (FOTO)  NA    Other Surveys  Select    Physical Performance Test   Yes    Simulated Eating Time (seconds) 11.25    Donning Doffing Jacket Time (seconds) 29.78    Donning Doffing Jacket Comments 3 button/ unbutton 34.68      Posture/Postural Control   Posture/Postural Control Postural limitations    Postural Limitations Rounded Shoulders;Forward head;Posterior pelvic tilt;Flexed trunk      Coordination   Fine Motor Movements are Fluid and Coordinated No    9 Hole Peg Test Right;Left    Right 9 Hole Peg Test 36.91    Left 9 Hole Peg Test 29.78    Box and Blocks RUE 41`, LUE 46      AROM   Overall AROM  Deficits    Overall AROM Comments RUE shoulder flexion 120, elbow ext -20, LUE shoulder flexion 120, elbow ext -5   Difficulty reaching behind back for toileting                            OT Short Term Goals - 12/03/19 1343      OT SHORT TERM GOAL #1   Title I with PD specific HEP    Time 4    Period Weeks    Status New    Target Date 01/02/20      OT SHORT TERM GOAL #2   Title Pt will verbalize understanding of adapted strategies to maximize safety and I with ADLs/  IADLs .    Time 4    Period Weeks    Status  New    Target Date 01/02/20      OT SHORT TERM GOAL #3   Title Pt will  demonstrate improved LUE functional use for ADLs as evidenced by increasing box/ blocks score by 4 blocks with RUE    Time 4    Period Weeks    Status New      OT SHORT TERM GOAL #4   Title Pt will demonstrate increased ease with dressing as evidenced by decreasing PPT#4(don/ doff jacket) to 25 secs or less    Baseline 29.78    Time 4    Period Weeks    Status New      OT SHORT TERM GOAL #5   Title Pt will retrieve a lightweight object from overhead shelf with -15 elbow extension in RUE    Time 4    Period Weeks    Status New             OT Long Term Goals - 12/03/19 1347      OT LONG TERM GOAL #1   Title Pt will verbalize understanding of ways to prevent future PD related complications and PD community resources.    Time 12    Period Weeks    Status New      OT LONG TERM GOAL #2   Title Pt will demonstrate improved fine motor coordination for ADLs as evidenced by decreasing 9 hole peg test score for RUE by 3 secs    Time 12    Period Weeks    Status New      OT LONG TERM GOAL #3   Title Pt will demonstrate improved ease with dressing as eveidenced by decreasing 3 button/ unbutton time to 30 secs or less    Baseline 34.68    Time 12    Period Weeks    Status New      OT LONG TERM GOAL #4   Title Pt will report increased ease with playing his guitar    Time 12    Period Weeks    Status New      OT LONG TERM GOAL #5   Title Pt will report pain in left shoulder no greater than 2/10 for ADLs.    Time 12    Period Weeks    Status New                 Plan - 12/03/19 1341    Clinical Impression Statement Pt is a 69 year old male who presents to [facility] with recent dx of Parkinson's disease.   Pt presents with the following deficits:decreased RUE coordination, rigidity, shoulder pain, decreased ROM, bradykinesia, decreased timing and  coordination of gait, decreased balance, postural instability , abnormal posture, decreased strength, and decreased flexibility.  He has had one fall in the past 6 months.  Pt enjoys spending time with friends and grandchildren.  He would benefit from skilled OT to address the above stated deficits for increased safety and I with ADLs/ IADLS. MIW:OEHOZYY, arthritis, GERD, prostate cancer, HTN, s/p R TKR 04/2019    OT Occupational Profile and History Detailed Assessment- Review of Records and additional review of physical, cognitive, psychosocial history related to current functional performance    Occupational performance deficits (Please refer to evaluation for details): ADL's;IADL's;Leisure;Social Participation    Body Structure / Function / Physical Skills ADL;Balance;Mobility;Strength;Tone;UE functional use;Flexibility;FMC;Coordination;Decreased knowledge of precautions;GMC;ROM;Decreased knowledge of use of DME;Dexterity;IADL    Rehab Potential Good    Clinical Decision Making Limited  treatment options, no task modification necessary    Comorbidities Affecting Occupational Performance: May have comorbidities impacting occupational performance    Modification or Assistance to Complete Evaluation  No modification of tasks or assist necessary to complete eval    OT Frequency 2x / week   may d/c after 4-5 weeks dep on progress   OT Duration 12 weeks   plan written for 12 weeks as pt is going on vacation for a week, pt may need to resume following vactions   OT Treatment/Interventions Self-care/ADL training;Therapeutic exercise;Functional Mobility Training;Balance training;Manual Therapy;Neuromuscular education;Ultrasound;Aquatic Therapy;Energy conservation;Therapeutic activities;DME and/or AE instruction;Paraffin;Cryotherapy;Fluidtherapy;Gait Training;Moist Heat;Contrast Bath;Passive range of motion;Patient/family education    Plan initiate coordination HEP, PWR! moves    Recommended Other Services Pt is  only scheduled for 4 weeks as he goes on vacation after 4 weeks    Consulted and Agree with Plan of Care Patient           Patient will benefit from skilled therapeutic intervention in order to improve the following deficits and impairments:   Body Structure / Function / Physical Skills: ADL, Balance, Mobility, Strength, Tone, UE functional use, Flexibility, FMC, Coordination, Decreased knowledge of precautions, GMC, ROM, Decreased knowledge of use of DME, Dexterity, IADL       Visit Diagnosis: Other lack of coordination - Plan: Ot plan of care cert/re-cert  Muscle weakness (generalized) - Plan: Ot plan of care cert/re-cert  Abnormal posture - Plan: Ot plan of care cert/re-cert  Other abnormalities of gait and mobility - Plan: Ot plan of care cert/re-cert  Other symptoms and signs involving the nervous system - Plan: Ot plan of care cert/re-cert    Problem List Patient Active Problem List   Diagnosis Date Noted  . S/P TKR (total knee replacement), right 05/05/2019  . Osteoarthritis of right knee 05/02/2019  . Prostate cancer ([place]) 03/22/2018  . Malignant neoplasm of prostate ([place]) 02/27/2018    Feliciana Narayan 12/03/2019, 3:56 PM Theone Murdoch, OTR/L [phone] Phone: [phone] 3:56 PM 12/03/19 [facility] [address] [place], [place], [address] Phone: [phone]   Fax:  [phone]  Name: Jayston Trevino MRN: [id number] Date of Birth: 11-22-1950

## 2019-12-04 NOTE — Therapy (Signed)
[facility] [address] [place], [place], [address] Phone: [phone]   Fax:  [phone]  Speech Language Pathology Evaluation  Patient Details  Name: Matthew [name] MRN: [id number] Date of Birth: Oct 10, 1950 Referring Provider (SLP): Dr. Rexene Alberts   Encounter Date: 12/02/2019   End of Session - 12/02/19 1110   Visit Number 1    Number of Visits 17    Date for SLP Re-Evaluation 01/31/20    SLP Start Time 1110    SLP Stop Time  1148    SLP Time Calculation (min) 38 min    Activity Tolerance Patient tolerated treatment well           Past Medical History:  Diagnosis Date  . Anxiety   . Arthritis   . GERD (gastroesophageal reflux disease)    occ remote history  . Hypertension   . Prostate cancer ([place])   . Right knee pain    questionable meniscal tear  . Seasonal allergies     Past Surgical History:  Procedure Laterality Date  . COLONOSCOPY    . Leg Laceration Repair  Left    Age 68  . PELVIC LYMPH NODE DISSECTION Bilateral 03/22/2018   Procedure: PELVIC LYMPH NODE DISSECTION;  Surgeon: Ceasar Mons, MD;  Location: [facility];  Service: Urology;  Laterality: Bilateral;  . PROSTATE BIOPSY    . ROBOT ASSISTED LAPAROSCOPIC RADICAL PROSTATECTOMY N/A 03/22/2018   Procedure: XI ROBOTIC ASSISTED LAPAROSCOPIC PROSTATECTOMY;  Surgeon: Ceasar Mons, MD;  Location: [facility];  Service: Urology;  Laterality: N/A;  . TOTAL KNEE ARTHROPLASTY Right 05/05/2019   Procedure: RIGHT TOTAL KNEE ARTHROPLASTY;  Surgeon: Frederik Pear, MD;  Location: [facility];  Service: Orthopedics;  Laterality: Right;    There were no vitals filed for this visit.   Subjective Assessment - 12/02/19 1110   Subjective "I notice sometimes people ask me to repeat myself. I guess I'm talking quieter sometimes."    Currently in Pain? Yes    Pain Score 3     Pain Location Shoulder    Pain Orientation Left    Pain Descriptors /  Indicators Aching    Pain Type Chronic pain              SLP Evaluation [facility] - 12/02/19 1110     SLP Visit Information   SLP Received On 12/02/19    Referring Provider (SLP) Dr. Rexene Alberts    Onset Date 11/03/19 referral   diagnosed ~2 months ago   Medical Diagnosis primary parkinsonism      General Information   HPI Matthew [name] is a 69 year old male with past medical history of hypertension, reflux disease, arthritis s/p right total knee replacement, seasonal allergies, anxiety, and prostate cancer s/p prostate surgery in November 2019 and XRT in 2020, referred by Dr. Rexene Alberts for speech therapy evaluation due to Parkinsonism. Reports onset of symptoms ~2 years ago.       Balance Screen   Has the patient fallen in the past 6 months No    Has the patient had a decrease in activity level because of a fear of falling?  No    Is the patient reluctant to leave their home because of a fear of falling?  No      Prior Functional Status   Cognitive/Linguistic Baseline Within functional limits    Type of Home House     Lives With Spouse    Available Support Family;Available PRN/intermittently    Vocation Retired  Cognition   Overall Cognitive Status --   Not formally assessed this date; pt denies changes     Auditory Comprehension   Overall Auditory Comprehension Appears within functional limits for tasks assessed      Visual Recognition/Discrimination   Discrimination Not tested      Reading Comprehension   Reading Status Within funtional limits      Expression   Primary Mode of Expression Verbal      Verbal Expression   Overall Verbal Expression Appears within functional limits for tasks assessed   patient reports occasional anomia; may assess and add goals     Written Expression   Dominant Hand Right    Written Expression Not tested      Oral Motor/Sensory Function   Overall Oral Motor/Sensory Function Other (comment)   deferred; pt masked     Motor Speech   Overall  Motor Speech Impaired    Respiration Impaired    Level of Impairment Conversation    Phonation Low vocal intensity    Resonance Within functional limits    Articulation Within functional limitis    Intelligibility Intelligible    Motor Planning Witnin functional limits    Effective Techniques Increased vocal intensity    Phonation WFL   pt reports occasional hoarse, raspy voice, not noted today     Standardized Assessments   Standardized Assessments  Other Assessment        Measured when a sound level meter was placed 30 cm away from pt's mouth, 6 minutes of conversational speech was reduced today, at average 67dB (WNL= average 70-72dB) with range of 63-75dB. Overall speech intelligibility for this listener in a quiet environment was not affected, at approx 100%. Production of loud /a/ averaged 88dB (range of 85 to 92) and occasional min cues needed for loudness.   Pt then rated effort level at 8/10 for production of loud /a/ (10=maximal effort). In phrase task pt was asked to use the same amount of effort as with loud /a/. Loudness average with this increased effort was 76 dB with modified independence; required occasional min A to maintain in mid 70s dB for paragraph reading. Pt would benefit from skilled ST in order to improve speech intelligibility and pt's QOL.            SLP Education - 12/02/19 1110   Education Details how parkinsonism can impact speech, cognition and swallowing    Person(s) Educated Patient    Methods Explanation    Comprehension Verbalized understanding            SLP Short Term Goals - 12/02/19 1110     SLP SHORT TERM GOAL #1   Title pt will produce /a/ with average >90 dB at 30 cm over 3 sessions    Time 4    Period Weeks    Status New      SLP SHORT TERM GOAL #2   Title pt will demo abdominal breathing with sentences 90% accuracy x 3 sessions    Time 4    Period Weeks    Status New      SLP SHORT TERM GOAL #3   Title pt will generate  10 minutes simple conversation with low 70s dB average in 3 sessions    Time 4    Period Weeks    Status New      SLP SHORT TERM GOAL #4   Title pt will complete clinical swallowing assessment if indicated.    Time 4  Period Weeks    Status New            SLP Long Term Goals - 12/02/19 1110     SLP LONG TERM GOAL #1   Title pt will use abdominal breathing >80% of the time during 10 minute conversation in 3 sessions    Time 8    Period Weeks    Status New      SLP LONG TERM GOAL #2   Title pt will maintain WNL vocal quality/intensity in 15 minute conversation outside [place] room x 3 sessions    Time 8    Period Weeks    Status New      SLP LONG TERM GOAL #3   Title pt will report fewer requests to repeat himself than prior to ST    Time 8    Period Weeks    Status New      SLP LONG TERM GOAL #4   Title pt will tell SLP 3 overt s/s difficulty oropharyngeal dysphagia with modified independence    Time 8    Period Weeks    Status New      SLP LONG TERM GOAL #5   Title pt will tell SLP 3 compensations for anomia    Time 8    Period Weeks    Status New            Plan - 12/02/19 1110   Clinical Impression Statement Patient presents with 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 He reports increase in requests to repeat himself over the past year. Intelligibility was not affected for this listener in a quiet environment. He reports occasional difficulties swallowing as well as anomia (mostly names); will assess further and add goals, possibly recommend MBS if warranted. I recommend skilled ST to address communication and if necessary, dysphagia, in order to improve pt's communication and quality of life.    Speech Therapy Frequency 2x / week    Duration --   8 weeks or 17 visits   Treatment/Interventions Aspiration precaution training;Language facilitation;Environmental  controls;Cueing hierarchy;SLP instruction and feedback;Compensatory techniques;Functional tasks;Compensatory strategies;Patient/family education;Diet toleration management by SLP;Other (comment)   MBS if warranted   Potential to Achieve Goals Good    Consulted and Agree with Plan of Care Patient           Patient will benefit from skilled therapeutic intervention in order to improve the following deficits and impairments:   Dysarthria and anarthria  Dysphagia, unspecified type  Aphasia    Problem List Patient Active Problem List   Diagnosis Date Noted  . S/P TKR (total knee replacement), right 05/05/2019  . Osteoarthritis of right knee 05/02/2019  . Prostate cancer ([place]) 03/22/2018  . Malignant neoplasm of prostate ([place]) 02/27/2018   Deneise Lever, [place], CCC-SLP Speech-Language Pathologist  Aliene Altes 12/04/2019, 11:05 AM  [facility] [address] [place], [place], [address] Phone: [phone]   Fax:  [phone]  Name: Matthew [name] MRN: [id number] Date of Birth: August 23, 1950

## 2019-12-05 ENCOUNTER — Other Ambulatory Visit: Payer: Self-pay

## 2019-12-05 ENCOUNTER — Ambulatory Visit: Payer: PPO | Admitting: Physical Therapy

## 2019-12-05 ENCOUNTER — Ambulatory Visit: Payer: PPO | Admitting: Occupational Therapy

## 2019-12-05 ENCOUNTER — Ambulatory Visit: Payer: PPO

## 2019-12-05 DIAGNOSIS — R278 Other lack of coordination: Secondary | ICD-10-CM

## 2019-12-05 DIAGNOSIS — M6281 Muscle weakness (generalized): Secondary | ICD-10-CM

## 2019-12-05 DIAGNOSIS — R2681 Unsteadiness on feet: Secondary | ICD-10-CM

## 2019-12-05 DIAGNOSIS — R293 Abnormal posture: Secondary | ICD-10-CM

## 2019-12-05 DIAGNOSIS — R1310 Dysphagia, unspecified: Secondary | ICD-10-CM

## 2019-12-05 DIAGNOSIS — R29818 Other symptoms and signs involving the nervous system: Secondary | ICD-10-CM

## 2019-12-05 DIAGNOSIS — R4701 Aphasia: Secondary | ICD-10-CM

## 2019-12-05 DIAGNOSIS — R2689 Other abnormalities of gait and mobility: Secondary | ICD-10-CM

## 2019-12-05 DIAGNOSIS — R471 Dysarthria and anarthria: Secondary | ICD-10-CM

## 2019-12-05 NOTE — Therapy (Signed)
[facility] [address] [place], [place], [address] Phone: [phone]   Fax:  [phone]  Speech Language Pathology Treatment  Patient Details  Name: Matthew [name] MRN: [id number] Date of Birth: 1951/01/02 Referring Provider (SLP): Dr. Rexene Alberts   Encounter Date: 12/05/2019   End of Session - 12/05/19 1434    Visit Number 2    Number of Visits 17    Date for SLP Re-Evaluation 01/31/20    SLP Start Time 0932    SLP Stop Time  9211    SLP Time Calculation (min) 43 min    Activity Tolerance Patient tolerated treatment well           Past Medical History:  Diagnosis Date  . Anxiety   . Arthritis   . GERD (gastroesophageal reflux disease)    occ remote history  . Hypertension   . Prostate cancer ([place])   . Right knee pain    questionable meniscal tear  . Seasonal allergies     Past Surgical History:  Procedure Laterality Date  . COLONOSCOPY    . Leg Laceration Repair  Left    Age 69  . PELVIC LYMPH NODE DISSECTION Bilateral 03/22/2018   Procedure: PELVIC LYMPH NODE DISSECTION;  Surgeon: Ceasar Mons, MD;  Location: [facility];  Service: Urology;  Laterality: Bilateral;  . PROSTATE BIOPSY    . ROBOT ASSISTED LAPAROSCOPIC RADICAL PROSTATECTOMY N/A 03/22/2018   Procedure: XI ROBOTIC ASSISTED LAPAROSCOPIC PROSTATECTOMY;  Surgeon: Ceasar Mons, MD;  Location: [facility];  Service: Urology;  Laterality: N/A;  . TOTAL KNEE ARTHROPLASTY Right 05/05/2019   Procedure: RIGHT TOTAL KNEE ARTHROPLASTY;  Surgeon: Frederik Pear, MD;  Location: [facility];  Service: Orthopedics;  Laterality: Right;    There were no vitals filed for this visit.   Subjective Assessment - 12/05/19 0932    Subjective "I have the drooling situation happening sometime."    Currently in Pain? Yes    Pain Score 3     Pain Location Knee    Pain Orientation Right    Pain Descriptors / Indicators Aching    Pain Type Chronic pain     Pain Onset More than a month ago    Pain Frequency Intermittent                 ADULT SLP TREATMENT - 12/05/19 0936      General Information   Behavior/Cognition Alert;Cooperative;Pleasant mood      Treatment Provided   Treatment provided Cognitive-Linquistic      Cognitive-Linquistic Treatment   Treatment focused on Dysarthria;Patient/family/caregiver education    Skilled Treatment Pt education about drooling - SLP suggested pt engage in behaviors that would encourage swallowing throughout the day such as chewing a 1/2 stick of gum and/or when pt talks for the first time in awhile to SWALLOW first.  (speech tx-individual) Pt completed loud /a/  at average mid to upper 80s dB and his everyday sentences with upper 80s dB. SLP educated pt about abdominal breathing (AB) and pt demo'd this in seated at rest for 2 minutes with approx 70% success. SLP told pt to practice in supine 10 minutes and in seated 5 mintues, BID. SLP explained rationale for AB, especially in pts with parkinson's. SLP reviewed his homework with him - 5 loud /a/ BID, read everyday sentences BID, practice AB in supine 10 minutes then 5 minuts in seated, and say family names and other phrases provided last session with a strong loud  voice.       Assessment / Recommendations / Plan   Plan Continue with current plan of care      Progression Toward Goals   Progression toward goals Progressing toward goals            SLP Education - 12/05/19 1012    Education Details abdominal breathing (AB)    Person(s) Educated Patient    Methods Explanation;Demonstration    Comprehension Verbalized understanding;Returned demonstration;Verbal cues required;Need further instruction            SLP Short Term Goals - 12/05/19 1435      SLP SHORT TERM GOAL #1   Title pt will produce /a/ with average >90 dB at 30 cm over 3 sessions    Time 4    Period Weeks    Status On-going      SLP SHORT TERM GOAL #2   Title pt will  demo abdominal breathing with sentences 90% accuracy x 3 sessions    Time 4    Period Weeks    Status On-going      SLP SHORT TERM GOAL #3   Title pt will generate 10 minutes simple conversation with low 70s dB average in 3 sessions    Time 4    Period Weeks    Status On-going      SLP SHORT TERM GOAL #4   Title pt will complete clinical swallowing assessment if indicated.    Time 4    Period Weeks    Status On-going            SLP Long Term Goals - 12/05/19 1435      SLP LONG TERM GOAL #1   Title pt will use abdominal breathing >80% of the time during 10 minute conversation in 3 sessions    Time 8    Period Weeks    Status On-going      SLP LONG TERM GOAL #2   Title pt will maintain WNL vocal quality/intensity in 15 minute conversation outside [place] room x 3 sessions    Time 8    Period Weeks    Status On-going      SLP LONG TERM GOAL #3   Title pt will report fewer requests to repeat himself than prior to ST    Time 8    Period Weeks    Status On-going      SLP LONG TERM GOAL #4   Title pt will tell SLP 3 overt s/s difficulty oropharyngeal dysphagia with modified independence    Time 8    Period Weeks    Status On-going      SLP LONG TERM GOAL #5   Title pt will tell SLP 3 compensations for anomia    Time 8    Period Weeks    Status On-going            Plan - 12/05/19 1434    Clinical Impression Statement Patient presents with 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 He reports increase in requests to repeat himself over the past year. Intelligibility was not affected for this listener in a quiet environment. He reports occasional difficulties swallowing as well as anomia (mostly names); will assess further and add goals, possibly recommend MBS if warranted. I recommend continued skilled ST to address communication and if necessary, dysphagia, in order to improve  pt's communication and quality of life.    Speech  Therapy Frequency 2x / week    Duration --   8 weeks or 17 visits   Treatment/Interventions Aspiration precaution training;Language facilitation;Environmental controls;Cueing hierarchy;SLP instruction and feedback;Compensatory techniques;Functional tasks;Compensatory strategies;Patient/family education;Diet toleration management by SLP;Other (comment)   MBS if warranted   Potential to Achieve Goals Good    Consulted and Agree with Plan of Care Patient           Patient will benefit from skilled therapeutic intervention in order to improve the following deficits and impairments:   Dysarthria and anarthria  Dysphagia, unspecified type  Aphasia    Problem List Patient Active Problem List   Diagnosis Date Noted  . S/P TKR (total knee replacement), right 05/05/2019  . Osteoarthritis of right knee 05/02/2019  . Prostate cancer ([place]) 03/22/2018  . Malignant neoplasm of prostate ([place]) 02/27/2018    [facility] ,[place], [place]  12/05/2019, 2:35 PM  [facility] [address] [place], [place], [address] Phone: [phone]   Fax:  [phone]   Name: Matthew [name] MRN: [id number] Date of Birth: 1950-06-15

## 2019-12-05 NOTE — Patient Instructions (Signed)
PWR! Hands  With arms stretched out in front of you (elbows straight), perform the following:  PWR! [name]: Move wrists up and down BIG  PWR! Twist: Twist palms up and down BIG  Then, start with elbows bent and hands closed.  PWR! Step: Touch index finger to thumb while keeping other fingers straight. Flick fingers out BIG (thumb out/straighten fingers). Repeat with other fingers. (Step your thumb to each finger).  PWR! Hands: Push hands out BIG. Elbows straight, wrists up, fingers open and spread apart BIG. (Can also perform by pushing down on table, chair, knees. Push above head, out to the side, behind you, in front of you.)   ** Make each movement big and deliberate so that you feel the movement.  Perform at least 10 repetitions 1x/day, but perform PWR! hands throughout the day when you are having trouble using your hands (picking up/manipulating small objects, writing, eating, typing, sewing, buttoning, etc.).  Coordination Exercises  Perform the following exercises for 20 minutes 1 times per day. Perform with both hand(s). Perform using big movements.   Flipping Cards: Place deck of cards on the table. Flip cards over by opening your hand big to grasp and then turn your palm up big.  Deal cards: Hold 1/2 or whole deck in your hand. Use thumb to push card off top of deck with one big push.  Rotate ball with fingertips: Pick up with fingers/thumb and move as much as you can with each turn/movement (clockwise and counter-clockwise).  Toss ball from one hand to the other: Toss big/high.  Toss ball in the air and catch with the same hand: Toss big/high.  Pick up coins and stack one at a time: Pick up with big, intentional movements. Do not drag coin to the edge. (5-10 in a stack)  Pick up 5-10 coins one at a time and hold in palm. Then, move coins from palm to fingertips one at time and place in coin bank/container.  Practice writing: Slow down, write big, and focus on forming  each letter.  Perform "Flicks"/hand stretches (PWR! Hands): Close hands then flick out your fingers with focus on opening hands, pulling wrists back, and extending elbows like you are pushing. 

## 2019-12-05 NOTE — Patient Instructions (Addendum)
   Practice abdominal breathing for 15 minutes twice a day; 10 minutes while laying down, then sitting up for 5 minutes.  SO, twice each day:  5 loud "ah" loud and long Read your "everyday sentences" Practice the abdominal breathing for 15 minutes.  ============================================= Say some family names with stronger, louder speech - you could mix in a few of the phrases from [facility]

## 2019-12-05 NOTE — Therapy (Signed)
[facility] [address] [place], [place], [address] Phone: [phone]   Fax:  [phone]  Occupational Therapy Treatment  Patient Details  Name: Matthew [name] MRN: [id number] Date of Birth: 1951-05-11 Referring Provider (OT): Dr. Rexene Alberts   Encounter Date: 12/05/2019   OT End of Session - 12/05/19 0927    Visit Number 2    Number of Visits 25    Date for OT Re-Evaluation 03/02/20    Authorization Type [employer]    Authorization Time Period POC written for 12 weeks , pt goes on vacation after 4 weeks and may need to resume after pt returns- may d/c after 4-8 weeks though    Authorization - Visit Number 2    Authorization - Number of Visits 10    OT Start Time 0850    OT Stop Time 0930    OT Time Calculation (min) 40 min           Past Medical History:  Diagnosis Date  . Anxiety   . Arthritis   . GERD (gastroesophageal reflux disease)    occ remote history  . Hypertension   . Prostate cancer ([place])   . Right knee pain    questionable meniscal tear  . Seasonal allergies     Past Surgical History:  Procedure Laterality Date  . COLONOSCOPY    . Leg Laceration Repair  Left    Age 69  . PELVIC LYMPH NODE DISSECTION Bilateral 03/22/2018   Procedure: PELVIC LYMPH NODE DISSECTION;  Surgeon: Ceasar Mons, MD;  [facility];  Service: Urology;  Laterality: Bilateral;  . PROSTATE BIOPSY    . ROBOT ASSISTED LAPAROSCOPIC RADICAL PROSTATECTOMY N/A 03/22/2018   Procedure: XI ROBOTIC ASSISTED LAPAROSCOPIC PROSTATECTOMY;  Surgeon: Ceasar Mons, MD;  Location: [facility];  Service: Urology;  Laterality: N/A;  . TOTAL KNEE ARTHROPLASTY Right 05/05/2019   Procedure: RIGHT TOTAL KNEE ARTHROPLASTY;  Surgeon: Frederik Pear, MD;  Location: [facility];  Service: Orthopedics;  Laterality: Right;    There were no vitals filed for this visit.   Subjective Assessment - 12/05/19 0852    Subjective  Pt  reports knee pain    Pertinent History PMH:PD newly diagnosed , anxiety, arthritis, GERD, prostate cancer, HTN, s/p R TKR 04/2019    Patient Stated Goals to play guitar again    Currently in Pain? Yes    Pain Score 3     Pain Location Knee    Pain Orientation Right    Pain Descriptors / Indicators Aching    Pain Type Chronic pain    Pain Onset More than a month ago    Pain Frequency Intermittent    Aggravating Factors  walking    Pain Relieving Factors stretches                  Arm bike x  Min level 1 for conditioning, min v.c for speed              OT Treatment/ Education - 12/05/19 0919    Education Details PWR! hands basic 4 10 reps each, coordination HEP, see pt instructions. Min-mod difficulty with RUE use    Person(s) Educated Patient    Methods Explanation;Demonstration;Verbal cues;Handout    Comprehension Verbalized understanding;Returned demonstration;Verbal cues required;Need further instruction            OT Short Term Goals - 12/03/19 1343      OT SHORT TERM GOAL #1   Title I  with PD specific HEP    Time 4    Period Weeks    Status New    Target Date 01/02/20      OT SHORT TERM GOAL #2   Title Pt will verbalize understanding of adapted strategies to maximize safety and I with ADLs/ IADLs .    Time 4    Period Weeks    Status New    Target Date 01/02/20      OT SHORT TERM GOAL #3   Title Pt will  demonstrate improved LUE functional use for ADLs as evidenced by increasing box/ blocks score by 4 blocks with RUE    Time 4    Period Weeks    Status New      OT SHORT TERM GOAL #4   Title Pt will demonstrate increased ease with dressing as evidenced by decreasing PPT#4(don/ doff jacket) to 25 secs or less    Baseline 29.78    Time 4    Period Weeks    Status New      OT SHORT TERM GOAL #5   Title Pt will retrieve a lightweight object from overhead shelf with -15 elbow extension in RUE    Time 4    Period Weeks    Status New              OT Long Term Goals - 12/03/19 1347      OT LONG TERM GOAL #1   Title Pt will verbalize understanding of ways to prevent future PD related complications and PD community resources.    Time 12    Period Weeks    Status New      OT LONG TERM GOAL #2   Title Pt will demonstrate improved fine motor coordination for ADLs as evidenced by decreasing 9 hole peg test score for RUE by 3 secs    Time 12    Period Weeks    Status New      OT LONG TERM GOAL #3   Title Pt will demonstrate improved ease with dressing as eveidenced by decreasing 3 button/ unbutton time to 30 secs or less    Baseline 34.68    Time 12    Period Weeks    Status New      OT LONG TERM GOAL #4   Title Pt will report increased ease with playing his guitar    Time 12    Period Weeks    Status New      OT LONG TERM GOAL #5   Title Pt will report pain in left shoulder no greater than 2/10 for ADLs.    Time 12    Period Weeks    Status New                 Plan - 12/05/19 0109    Clinical Impression Statement Pt is progressing towards goals. He demonstrates understanding of inital HEP but may bnefit from review.    OT Occupational Profile and History Detailed Assessment- Review of Records and additional review of physical, cognitive, psychosocial history related to current functional performance    Occupational performance deficits (Please refer to evaluation for details): ADL's;IADL's;Leisure;Social Participation    Body Structure / Function / Physical Skills ADL;Balance;Mobility;Strength;Tone;UE functional use;Flexibility;FMC;Coordination;Decreased knowledge of precautions;GMC;ROM;Decreased knowledge of use of DME;Dexterity;IADL    Rehab Potential Good    Clinical Decision Making Limited treatment options, no task modification necessary    Comorbidities Affecting Occupational Performance: May have comorbidities impacting occupational  performance    Modification or Assistance to Complete Evaluation   No modification of tasks or assist necessary to complete eval    OT Frequency 2x / week   may d/c after 4-5 weeks dep on progress   OT Duration 12 weeks   plan written for 12 weeks as pt is going on vacation for a week, pt may need to resume following vactions   OT Treatment/Interventions Self-care/ADL training;Therapeutic exercise;Functional Mobility Training;Balance training;Manual Therapy;Neuromuscular education;Ultrasound;Aquatic Therapy;Energy conservation;Therapeutic activities;DME and/or AE instruction;Paraffin;Cryotherapy;Fluidtherapy;Gait Training;Moist Heat;Contrast Bath;Passive range of motion;Patient/family education    Plan initiate PWr! seated, check on how coordination HEP is going    Recommended Other Services Pt is only scheduled for 4 weeks as he goes on vacation after 4 weeks    Consulted and Agree with Plan of Care Patient           Patient will benefit from skilled therapeutic intervention in order to improve the following deficits and impairments:   Body Structure / Function / Physical Skills: ADL, Balance, Mobility, Strength, Tone, UE functional use, Flexibility, FMC, Coordination, Decreased knowledge of precautions, GMC, ROM, Decreased knowledge of use of DME, Dexterity, IADL       Visit Diagnosis: Muscle weakness (generalized)  Abnormal posture  Other symptoms and signs involving the nervous system  Other lack of coordination    Problem List Patient Active Problem List   Diagnosis Date Noted  . S/P TKR (total knee replacement), right 05/05/2019  . Osteoarthritis of right knee 05/02/2019  . Prostate cancer ([place]) 03/22/2018  . Malignant neoplasm of prostate ([place]) 02/27/2018    Reverie Vaquera 12/05/2019, 9:28 AM  [facility] [address] [place], [place], [address] Phone: [phone]   Fax:  [phone]  Name: Cyle Kenyon MRN: [id number] Date of Birth: 1950-12-09

## 2019-12-06 NOTE — Therapy (Signed)
[facility] [address] [place], [place], [address] Phone: [phone]   Fax:  [phone]  Physical Therapy Treatment  Patient Details  Name: Matthew [name] MRN: [id number] Date of Birth: 24-Jun-1950 Referring Provider (PT): Star Age   Encounter Date: 12/05/2019   PT End of Session - 12/05/19 1102    Visit Number 3    Number of Visits 17    Date for PT Re-Evaluation 17/61/60   90 day cert due to delay in start of scheduling, 8-wk POC   Authorization Type [employer]    Progress Note Due on Visit 10    PT Start Time 1021    PT Stop Time 1100    PT Time Calculation (min) 39 min    Activity Tolerance Patient tolerated treatment well    Behavior During Therapy [facility] for tasks assessed/performed           Past Medical History:  Diagnosis Date  . Anxiety   . Arthritis   . GERD (gastroesophageal reflux disease)    occ remote history  . Hypertension   . Prostate cancer ([place])   . Right knee pain    questionable meniscal tear  . Seasonal allergies     Past Surgical History:  Procedure Laterality Date  . COLONOSCOPY    . Leg Laceration Repair  Left    Age 69  . PELVIC LYMPH NODE DISSECTION Bilateral 03/22/2018   Procedure: PELVIC LYMPH NODE DISSECTION;  Surgeon: Ceasar Mons, MD;  Location: [facility];  Service: Urology;  Laterality: Bilateral;  . PROSTATE BIOPSY    . ROBOT ASSISTED LAPAROSCOPIC RADICAL PROSTATECTOMY N/A 03/22/2018   Procedure: XI ROBOTIC ASSISTED LAPAROSCOPIC PROSTATECTOMY;  Surgeon: Ceasar Mons, MD;  [facility]: [facility];  Service: Urology;  Laterality: N/A;  . TOTAL KNEE ARTHROPLASTY Right 05/05/2019   Procedure: RIGHT TOTAL KNEE ARTHROPLASTY;  Surgeon: Frederik Pear, MD;  [facility];  Service: Orthopedics;  Laterality: Right;    There were no vitals filed for this visit.   Subjective Assessment - 12/05/19 1024    Subjective No falls, no major changes.   Had a little earlier stiffnees in the knee, but it's better with movement.    Patient is accompained by: Family member   wife   Pertinent History hx of prostate cancer/removal, incontinence, hx of R TKR 04/2019    Patient Stated Goals Pt's goal for therapy is to improve walking, to have strength and be normal again.  Wants to get back to playing guitar.    Currently in Pain? No/denies   knee pain has resolved with movement in OT   Pain Onset More than a month ago                             [facility] Adult PT Treatment/Exercise - 12/05/19 1025      Ambulation/Gait   Ambulation/Gait Yes    Ambulation/Gait Assistance 5: Supervision    Ambulation/Gait Assistance Details Improved ability to perform gait with reciprocal arm swing, but pace and arm swing does slow with conversation tasks.    Ambulation Distance (Feet) 430 Feet   x 2   Assistive device None    Gait Pattern Step-through pattern;Decreased arm swing - right;Decreased arm swing - left;Decreased step length - right;Decreased step length - left;Right flexed knee in stance;Left flexed knee in stance;Decreased trunk rotation;Trunk flexed      Exercises   Exercises Knee/Hip  Knee/Hip Exercises: Stretches   Active Hamstring Stretch Right;2 reps;30 seconds    Active Hamstring Stretch Limitations Propped on 4" block, while seated on mat; cues provided to maintain upright posture and lean forward through hips for optimal stretch    Other Knee/Hip Stretches Pt demonstrates additional hamstring stretch he does at home-long sit stretch on mat, 30 seconds.             Neuro Re-education Reviewed PWR! Moves in standing, provided as HEP last visit.  Pt uses handouts as reference (PT makes additional written notes with cues from today)    PWR! Up for improved posture 2 x10 reps B -cues to lower arms to decr L shoulder discomfort and for palms forward.  Cues also for full, terminal knee extension (activate gluts and quads  upon standing for full upright posture)   PWR! [name] for improved weighshifting 2 x 5 reps B - cues to look at hand and for optimal positioning through L shoulder to avoid discomfort.   PWR! Twist for improved trunk rotation 2 x 5 reps B -cues for intensity with clap and to look at hand, cues for pivot through foot to increase trunk rotation motion   PWR! Step for improved step initiation 2 x 5 reps B. - cues for incr foot clearance    Cues provided for intensity, technique. Cues to perform with "big hands" throughout     Rated work effort 4/10 during PWR! Moves    Balance Exercises - 12/05/19 1025      Balance Exercises: Standing   Stepping Strategy Posterior;10 reps;Limitations;Anterior;UE support    Stepping Strategy Limitations Cues for technique, foot clearance, weigthshifting, cues to keep R knee extended in stance and have increased R hip excursion forward with anterior weigthshfiting.    Other Standing Exercises STagger stance rocking forward/back at counter, x 10 reps each position, with cues for improved weigthshfiting and poistioning.               PT Short Term Goals - 10/30/19 1655      PT SHORT TERM GOAL #1   Title Pt will be indepedent with Parkinson's specific HEP to address balance, transfers, gait for improved overall mobility.  TARGET 12/26/2019 (delayed date due to delayed start-Pt wants OT, PT, speech together)    Time 4    Period Weeks    Status New    Target Date 12/26/19      PT SHORT TERM GOAL #2   Title Pt will perform at least 8 of 10 reps of sit<>stand transfers with no posterior lean or LOB.    Time 4    Period Weeks    Status New    Target Date 12/26/19      PT SHORT TERM GOAL #3   Title Pt will improve posterior push and release test to recover in less than 2 steps, for improved balance recovery.    Time 4    Period Weeks    Status New    Target Date 12/26/19      PT SHORT TERM GOAL #4   Title Pt will verbalize understanding of fall  prevention education in home environment.    Time 4    Period Weeks    Status New    Target Date 12/26/19             PT Long Term Goals - 10/30/19 1659      PT LONG TERM GOAL #1   Title Pt will be independent  with progression of HEP for Parkinson's related deficits.  TARGET 01/23/2020 (delayed due to delayed start for coordination of schedules)    Time 8    Period Weeks    Status New    Target Date 01/23/20      PT LONG TERM GOAL #2   Title Pt will improve gait velocity to at least 2.8 ft/sec for improved gait efficiency and safety.    Baseline 2.5 ft/sec    Time 8    Period Weeks    Status New    Target Date 01/23/20      PT LONG TERM GOAL #3   Title Pt will improve MiniBESTest score to at least 23/28 for decreased fall risk.    Time 8    Period Weeks    Status New    Target Date 12/26/19      PT LONG TERM GOAL #4   Title Pt will improve TUG/TUG cognitive score to less than 10% difference for improved ability with dual tasking with gait.    Time 8    Period Weeks    Status New    Target Date 01/23/20      PT LONG TERM GOAL #5   Title Pt/wife will verbalize understanding of local Parkinson's disease-related resources, including ongoing community fitness.    Time 8    Period Weeks    Status New    Target Date 01/23/20                 Plan - 12/06/19 0720    Clinical Impression Statement Review of standing PWR! Moves, which were given as HEP last visit.  Pt return demo PWR! Moves with overall good technique; PT provides cues for technique at times and for increased intensity.  Pt rates effort as 4/10 level, so will continue to work on amplitude/intensity of movements.    Personal Factors and Comorbidities Comorbidity 3+    Comorbidities anxiety, arthritis, GERD, prostate cancer, HTN, s/p R TKR 04/2019    Examination-Activity Limitations Locomotion Level;Transfers;Stand;Stairs    Examination-Participation Restrictions Community Activity;Other   playing with  grandchildren   Stability/Clinical Decision Making Evolving/Moderate complexity    Rehab Potential Good    PT Frequency 2x / week    PT Duration 8 weeks   plus eval   PT Treatment/Interventions ADLs/Self Care Home Management;Gait training;Functional mobility training;Therapeutic activities;Therapeutic exercise;Balance training;Neuromuscular re-education;DME Instruction;Patient/family education    PT Next Visit Plan Continue standing PWR moves, working on increased intensity; balance, posture, gait with arm swing; balance reactions especially in posterior direction    PT Home Exercise Plan standing PWR moves    Consulted and Agree with Plan of Care Patient;Family member/caregiver    Family Member Consulted wife           Patient will benefit from skilled therapeutic intervention in order to improve the following deficits and impairments:  Abnormal gait, Difficulty walking, Decreased balance, Decreased mobility, Postural dysfunction, Decreased strength, Impaired flexibility  Visit Diagnosis: Abnormal posture  Unsteadiness on feet  Other abnormalities of gait and mobility     Problem List Patient Active Problem List   Diagnosis Date Noted  . S/P TKR (total knee replacement), right 05/05/2019  . Osteoarthritis of right knee 05/02/2019  . Prostate cancer ([place]) 03/22/2018  . Malignant neoplasm of prostate ([place]) 02/27/2018    Doneshia Hill W. 12/06/2019, 7:23 AM Mady Haagensen, PT 12/06/19 7:26 AM Phone: [phone] Fax: [facility] [address]  [place], [place], [address] Phone: [phone]   Fax:  [phone]  Name: Ulysse Siemen MRN: [id number] Date of Birth: [birth year]

## 2019-12-09 ENCOUNTER — Ambulatory Visit: Payer: PPO | Admitting: Physical Therapy

## 2019-12-09 ENCOUNTER — Other Ambulatory Visit: Payer: Self-pay

## 2019-12-09 ENCOUNTER — Ambulatory Visit: Payer: PPO | Admitting: Occupational Therapy

## 2019-12-09 ENCOUNTER — Ambulatory Visit: Payer: PPO

## 2019-12-09 DIAGNOSIS — R471 Dysarthria and anarthria: Secondary | ICD-10-CM

## 2019-12-09 DIAGNOSIS — R293 Abnormal posture: Secondary | ICD-10-CM

## 2019-12-09 DIAGNOSIS — R2689 Other abnormalities of gait and mobility: Secondary | ICD-10-CM

## 2019-12-09 DIAGNOSIS — R1310 Dysphagia, unspecified: Secondary | ICD-10-CM

## 2019-12-09 DIAGNOSIS — R29818 Other symptoms and signs involving the nervous system: Secondary | ICD-10-CM

## 2019-12-09 DIAGNOSIS — R2681 Unsteadiness on feet: Secondary | ICD-10-CM

## 2019-12-09 DIAGNOSIS — R278 Other lack of coordination: Secondary | ICD-10-CM

## 2019-12-09 DIAGNOSIS — R4701 Aphasia: Secondary | ICD-10-CM

## 2019-12-09 DIAGNOSIS — M6281 Muscle weakness (generalized): Secondary | ICD-10-CM

## 2019-12-09 NOTE — Therapy (Signed)
[facility] [address] [place], [place], [address] Phone: [phone]   Fax:  [phone]  Speech Language Pathology Treatment  Patient Details  Name: Matthew [name] MRN: [id number] Date of Birth: 1950/06/27 Referring Provider (SLP): Dr. Rexene Alberts   Encounter Date: 12/09/2019   End of Session - 12/09/19 1547    Visit Number 3    Number of Visits 17    Date for SLP Re-Evaluation 01/31/20    SLP Start Time 8938    SLP Stop Time  1532    SLP Time Calculation (min) 43 min    Activity Tolerance Patient tolerated treatment well           Past Medical History:  Diagnosis Date  . Anxiety   . Arthritis   . GERD (gastroesophageal reflux disease)    occ remote history  . Hypertension   . Prostate cancer ([place])   . Right knee pain    questionable meniscal tear  . Seasonal allergies     Past Surgical History:  Procedure Laterality Date  . COLONOSCOPY    . Leg Laceration Repair  Left    Age 28  . PELVIC LYMPH NODE DISSECTION Bilateral 03/22/2018   Procedure: PELVIC LYMPH NODE DISSECTION;  Surgeon: Ceasar Mons, MD;  [facility];  Service: Urology;  Laterality: Bilateral;  . PROSTATE BIOPSY    . ROBOT ASSISTED LAPAROSCOPIC RADICAL PROSTATECTOMY N/A 03/22/2018   Procedure: XI ROBOTIC ASSISTED LAPAROSCOPIC PROSTATECTOMY;  Surgeon: Ceasar Mons, MD;  [facility];  Service: Urology;  Laterality: N/A;  . TOTAL KNEE ARTHROPLASTY Right 05/05/2019   Procedure: RIGHT TOTAL KNEE ARTHROPLASTY;  Surgeon: Frederik Pear, MD;  [facility];  Service: Orthopedics;  Laterality: Right;    There were no vitals filed for this visit.   Subjective Assessment - 12/09/19 1458    Subjective "The drooling is better, still happens a little but I'm thinking more about it." Pt also reports chewing gum.    Currently in Pain? Yes    Pain Score 1     Pain Location Back    Pain Orientation Lower;Right;Left                  ADULT SLP TREATMENT - 12/09/19 1501      General Information   Behavior/Cognition Alert;Cooperative;Pleasant mood      Treatment Provided   Treatment provided Cognitive-Linquistic      Cognitive-Linquistic Treatment   Treatment focused on Dysarthria;Patient/family/caregiver education    Skilled Treatment Pt demonstrated abdominal breathing (AB) for pt in seated position for 2 minutes with 95% success. Loud /a/ was used to foster pt's incr'd volume for conversation o- average mid 80s dB. Pt req'd cueing for higher pitch /a/. Everyday sentences were read with average low 80s dB. SLP engaged pt in 10 minutes simple conversation with average upper 60s'-low 70s dB.       Assessment / Recommendations / Plan   Plan Continue with current plan of care      Progression Toward Goals   Progression toward goals Progressing toward goals              SLP Short Term Goals - 12/09/19 1553      SLP SHORT TERM GOAL #1   Title pt will produce /a/ with average >90 dB at 30 cm over 3 sessions    Time 3    Period Weeks    Status On-going      SLP SHORT TERM  GOAL #2   Title pt will demo abdominal breathing with sentences 90% accuracy x 3 sessions    Time 3    Period Weeks    Status On-going      SLP SHORT TERM GOAL #3   Title pt will generate 10 minutes simple conversation with low 70s dB average in 3 sessions    Time 3    Period Weeks    Status On-going      SLP SHORT TERM GOAL #4   Title pt will complete clinical swallowing assessment if indicated.    Time 3    Period Weeks    Status On-going            SLP Long Term Goals - 12/09/19 1553      SLP LONG TERM GOAL #1   Title pt will use abdominal breathing >80% of the time during 10 minute conversation in 3 sessions    Time 7    Period Weeks    Status On-going      SLP LONG TERM GOAL #2   Title pt will maintain WNL vocal quality/intensity in 15 minute conversation outside [place] room x 3 sessions    Time 7      Period Weeks    Status On-going      SLP LONG TERM GOAL #3   Title pt will report fewer requests to repeat himself than prior to ST    Time 7    Period Weeks    Status On-going      SLP LONG TERM GOAL #4   Title pt will tell SLP 3 overt s/s difficulty oropharyngeal dysphagia with modified independence    Time 7    Period Weeks    Status On-going      SLP LONG TERM GOAL #5   Title pt will tell SLP 3 compensations for anomia    Time 7    Period Weeks    Status On-going            Plan - 12/09/19 1547    Clinical Impression Statement Patient presents with 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 He reports increase in requests to repeat himself over the past year. Intelligibility was not affected for this listener in a quiet environment. He reports occasional difficulties swallowing as well as anomia (mostly names); will assess further and add goals, possibly recommend MBS if warranted. I recommend continued skilled ST to address communication and if necessary, dysphagia, in order to improve pt's communication and quality of life.    Speech Therapy Frequency 2x / week    Duration --   8 weeks or 17 visits   Treatment/Interventions Aspiration precaution training;Language facilitation;Environmental controls;Cueing hierarchy;SLP instruction and feedback;Compensatory techniques;Functional tasks;Compensatory strategies;Patient/family education;Diet toleration management by SLP;Other (comment)   MBS if warranted   Potential to Achieve Goals Good    Consulted and Agree with Plan of Care Patient           Patient will benefit from skilled therapeutic intervention in order to improve the following deficits and impairments:   Dysarthria and anarthria  Dysphagia, unspecified type  Aphasia    Problem List Patient Active Problem List   Diagnosis Date Noted  . S/P TKR (total knee  replacement), right 05/05/2019  . Osteoarthritis of right knee 05/02/2019  . Prostate cancer ([place]) 03/22/2018  . Malignant neoplasm of prostate ([place]) 02/27/2018    [place]  12/09/2019,  3:54 PM  [facility] [address] [place], [place], [address] Phone: [phone]   Fax:  [phone]   Name: Omar Gayden MRN: [id number] Date of Birth: 07/10/1950

## 2019-12-09 NOTE — Therapy (Signed)
[facility] [address] [place], [place], [address] Phone: [phone]   Fax:  [phone]  Occupational Therapy Treatment  Patient Details  Name: Matthew [name] MRN: [id number] Date of Birth: 01-06-1951 Referring Provider (OT): Dr. Rexene Alberts   Encounter Date: 12/09/2019   OT End of Session - 12/09/19 1536    Visit Number 3    Number of Visits 25    Date for OT Re-Evaluation 03/02/20    Authorization Type [employer]    Authorization Time Period POC written for 12 weeks , pt goes on vacation after 4 weeks and may need to resume after pt returns- may d/c after 4-8 weeks though    Authorization - Visit Number 3    Authorization - Number of Visits 10    OT Start Time 1404    OT Stop Time 1444    OT Time Calculation (min) 40 min           Past Medical History:  Diagnosis Date  . Anxiety   . Arthritis   . GERD (gastroesophageal reflux disease)    occ remote history  . Hypertension   . Prostate cancer ([place])   . Right knee pain    questionable meniscal tear  . Seasonal allergies     Past Surgical History:  Procedure Laterality Date  . COLONOSCOPY    . Leg Laceration Repair  Left    Age 69  . PELVIC LYMPH NODE DISSECTION Bilateral 03/22/2018   Procedure: PELVIC LYMPH NODE DISSECTION;  Surgeon: Ceasar Mons, MD;  [facility];  Service: Urology;  Laterality: Bilateral;  . PROSTATE BIOPSY    . ROBOT ASSISTED LAPAROSCOPIC RADICAL PROSTATECTOMY N/A 03/22/2018   Procedure: XI ROBOTIC ASSISTED LAPAROSCOPIC PROSTATECTOMY;  Surgeon: Ceasar Mons, MD;  [facility];  Service: Urology;  Laterality: N/A;  . TOTAL KNEE ARTHROPLASTY Right 05/05/2019   Procedure: RIGHT TOTAL KNEE ARTHROPLASTY;  Surgeon: Frederik Pear, MD;  [facility];  Service: Orthopedics;  Laterality: Right;    There were no vitals filed for this visit.   Subjective Assessment - 12/09/19 1406    Subjective  Pt  reports back pain    Currently in Pain? Yes    Pain Score 3     Pain Location Back    Pain Descriptors / Indicators Aching    Pain Type Acute pain    Pain Onset More than a month ago    Pain Frequency Intermittent    Aggravating Factors  unknown    Pain Relieving Factors rest              Treatment: Dynamic step and reach to flip large playing cards, min v.c for larger amplitude movements. PWR! Hands prior to activities, min v.c Arm bike x 5 mins level 1 for conditioning                   OT Education - 12/09/19 1538    Education Details Handwriting strategeis with practice, min v.c, reveiwed card and coin actities from HEP, min v.c    Person(s) Educated Patient    Methods Explanation;Demonstration;Verbal cues;Handout    Comprehension Verbalized understanding;Returned demonstration;Verbal cues required            OT Short Term Goals - 12/03/19 1343      OT SHORT TERM GOAL #1   Title I with PD specific HEP    Time 4    Period Weeks    Status New  Target Date 01/02/20      OT SHORT TERM GOAL #2   Title Pt will verbalize understanding of adapted strategies to maximize safety and I with ADLs/ IADLs .    Time 4    Period Weeks    Status New    Target Date 01/02/20      OT SHORT TERM GOAL #3   Title Pt will  demonstrate improved LUE functional use for ADLs as evidenced by increasing box/ blocks score by 4 blocks with RUE    Time 4    Period Weeks    Status New      OT SHORT TERM GOAL #4   Title Pt will demonstrate increased ease with dressing as evidenced by decreasing PPT#4(don/ doff jacket) to 25 secs or less    Baseline 29.78    Time 4    Period Weeks    Status New      OT SHORT TERM GOAL #5   Title Pt will retrieve a lightweight object from overhead shelf with -15 elbow extension in RUE    Time 4    Period Weeks    Status New             OT Long Term Goals - 12/03/19 1347      OT LONG TERM GOAL #1   Title Pt will verbalize  understanding of ways to prevent future PD related complications and PD community resources.    Time 12    Period Weeks    Status New      OT LONG TERM GOAL #2   Title Pt will demonstrate improved fine motor coordination for ADLs as evidenced by decreasing 9 hole peg test score for RUE by 3 secs    Time 12    Period Weeks    Status New      OT LONG TERM GOAL #3   Title Pt will demonstrate improved ease with dressing as eveidenced by decreasing 3 button/ unbutton time to 30 secs or less    Baseline 34.68    Time 12    Period Weeks    Status New      OT LONG TERM GOAL #4   Title Pt will report increased ease with playing his guitar    Time 12    Period Weeks    Status New      OT LONG TERM GOAL #5   Title Pt will report pain in left shoulder no greater than 2/10 for ADLs.    Time 12    Period Weeks    Status New                 Plan - 12/09/19 1536    Clinical Impression Statement Pt is progressing towards goals. He continues to find fine motor coordination activities challenging particularly with RUE    OT Occupational Profile and History Detailed Assessment- Review of Records and additional review of physical, cognitive, psychosocial history related to current functional performance    Occupational performance deficits (Please refer to evaluation for details): ADL's;IADL's;Leisure;Social Participation    Body Structure / Function / Physical Skills ADL;Balance;Mobility;Strength;Tone;UE functional use;Flexibility;FMC;Coordination;Decreased knowledge of precautions;GMC;ROM;Decreased knowledge of use of DME;Dexterity;IADL    Rehab Potential Good    Clinical Decision Making Limited treatment options, no task modification necessary    Comorbidities Affecting Occupational Performance: May have comorbidities impacting occupational performance    Modification or Assistance to Complete Evaluation  No modification of tasks or assist necessary to complete eval  OT Frequency 2x /  week   may d/c after 4-5 weeks dep on progress   OT Duration 12 weeks   plan written for 12 weeks as pt is going on vacation for a week, pt may need to resume following vactions   OT Treatment/Interventions Self-care/ADL training;Therapeutic exercise;Functional Mobility Training;Balance training;Manual Therapy;Neuromuscular education;Ultrasound;Aquatic Therapy;Energy conservation;Therapeutic activities;DME and/or AE instruction;Paraffin;Cryotherapy;Fluidtherapy;Gait Training;Moist Heat;Contrast Bath;Passive range of motion;Patient/family education    Plan initiate PWR! seated, ADL strategies    Recommended Other Services Pt is only scheduled for 4 weeks as he goes on vacation after 4 weeks    Consulted and Agree with Plan of Care Patient           Patient will benefit from skilled therapeutic intervention in order to improve the following deficits and impairments:   Body Structure / Function / Physical Skills: ADL, Balance, Mobility, Strength, Tone, UE functional use, Flexibility, FMC, Coordination, Decreased knowledge of precautions, GMC, ROM, Decreased knowledge of use of DME, Dexterity, IADL       Visit Diagnosis: Muscle weakness (generalized)  Abnormal posture  Other symptoms and signs involving the nervous system  Other lack of coordination  Unsteadiness on feet  Other abnormalities of gait and mobility    Problem List Patient Active Problem List   Diagnosis Date Noted  . S/P TKR (total knee replacement), right 05/05/2019  . Osteoarthritis of right knee 05/02/2019  . Prostate cancer ([place]) 03/22/2018  . Malignant neoplasm of prostate ([place]) 02/27/2018    Jonel Sick 12/09/2019, 3:39 PM  [facility] [address] [place], [place], [address] Phone: [phone]   Fax:  [phone]  Name: Logyn Kendrick MRN: [id number] Date of Birth: 06-23-1950

## 2019-12-09 NOTE — Therapy (Signed)
[facility] [address] [place], [place], [address] Phone: [phone]   Fax:  [phone]  Physical Therapy Treatment  Patient Details  Name: Matthew [name] MRN: [id number] Date of Birth: 1950-08-25 Referring Provider (PT): Star Age   Encounter Date: 12/09/2019   PT End of Session - 12/09/19 1431    Visit Number 4    Number of Visits 17    Date for PT Re-Evaluation 85/88/50   90 day cert due to delay in start of scheduling, 8-wk POC   Authorization Type [employer]    Progress Note Due on Visit 10    PT Start Time 1319    PT Stop Time 1400    PT Time Calculation (min) 41 min    Activity Tolerance Patient tolerated treatment well;No increased pain   Reported decrease in low back pain at end of session   Behavior During Therapy [facility] for tasks assessed/performed           Past Medical History:  Diagnosis Date  . Anxiety   . Arthritis   . GERD (gastroesophageal reflux disease)    occ remote history  . Hypertension   . Prostate cancer ([place])   . Right knee pain    questionable meniscal tear  . Seasonal allergies     Past Surgical History:  Procedure Laterality Date  . COLONOSCOPY    . Leg Laceration Repair  Left    Age 78  . PELVIC LYMPH NODE DISSECTION Bilateral 03/22/2018   Procedure: PELVIC LYMPH NODE DISSECTION;  Surgeon: Ceasar Mons, MD;  Location: [facility];  Service: Urology;  Laterality: Bilateral;  . PROSTATE BIOPSY    . ROBOT ASSISTED LAPAROSCOPIC RADICAL PROSTATECTOMY N/A 03/22/2018   Procedure: XI ROBOTIC ASSISTED LAPAROSCOPIC PROSTATECTOMY;  Surgeon: Ceasar Mons, MD;  Location: [facility];  Service: Urology;  Laterality: N/A;  . TOTAL KNEE ARTHROPLASTY Right 05/05/2019   Procedure: RIGHT TOTAL KNEE ARTHROPLASTY;  Surgeon: Frederik Pear, MD;  Location: [facility];  Service: Orthopedics;  Laterality: Right;    There were no vitals filed for this visit.   Subjective  Assessment - 12/09/19 1321    Subjective Woke up on Sunday morning and my back was bothering me.  Has happened before, and it's getting slightly better.  Slows me down a bit.    Patient is accompained by: Family member   wife   Pertinent History hx of prostate cancer/removal, incontinence, hx of R TKR 04/2019    Patient Stated Goals Pt's goal for therapy is to improve walking, to have strength and be normal again.  Wants to get back to playing guitar.    Currently in Pain? Yes    Pain Score 3     Pain Location Back    Pain Orientation Right;Left;Lower    Pain Descriptors / Indicators Aching    Pain Type Acute pain    Pain Onset More than a month ago    Aggravating Factors  unsure; trying to get up    Pain Relieving Factors nothing has helped so far, just getting better with time; sitting with support                             [facility] Adult PT Treatment/Exercise - 12/09/19 0001      Ambulation/Gait   Ambulation/Gait Yes    Ambulation/Gait Assistance 5: Supervision    Ambulation/Gait Assistance Details Cues for upright posture, reciprocal arm swing  and increased heelstrike with gait    Ambulation Distance (Feet) 230 Feet   100 ft   Assistive device None    Gait Pattern Step-through pattern;Decreased arm swing - right;Decreased arm swing - left;Decreased step length - right;Decreased step length - left;Right flexed knee in stance;Left flexed knee in stance;Decreased trunk rotation;Trunk flexed    Ambulation Surface Level;Indoor      Therapeutic Activites    Therapeutic Activities Other Therapeutic Activities    Other Therapeutic Activities Discussed bed mobility and positioning, including potential use of pillows under knees while lying in supine, between knees while sidelying during sleep.  Educated on importance of gentle low back flexibility exercises, trying them first thing each morning to try to reduce stiffness in low back.  Discussed and practiced bed mobility,  with pt initially demo coming up to long sit from supine (stress on low back).  Education provided and worked on supine trunk rotation rocking with momentum, then to L sidelying to sit, with min guard and cues.      Exercises   Exercises Lumbar      Lumbar Exercises: Stretches   Single Knee to Chest Stretch Right;Left;2 reps;30 seconds    Lower Trunk Rotation 3 reps;30 seconds    Lower Trunk Rotation Limitations Initiated with rocking side to side, x 5 reps each; pt feels stretch in R buttock/low back area with legs rotated towards left.    Pelvic Tilt 10 reps   initial facilitation cues   Piriformis Stretch Right;Left;2 reps;30 seconds    Piriformis Stretch Limitations Cues for technique and positioning      Knee/Hip Exercises: Aerobic   Stepper Seated SciFit Stepper, BUEs/BLEs, x 7 minutes, Level 1.2, initial RPM at 56, cues for keeping RPM >60; pt able to maintain RPM 70-80 throughout.  No increase in pain at end of sciFit.                  PT Education - 12/09/19 1431    Education Details Updates to HEP, bed mobility/positioning to help decrease low back stiffness    Person(s) Educated Patient    Methods Explanation;Demonstration;Verbal cues;Handout    Comprehension Verbalized understanding;Returned demonstration;Verbal cues required;Need further instruction            PT Short Term Goals - 10/30/19 1655      PT SHORT TERM GOAL #1   Title Pt will be indepedent with Parkinson's specific HEP to address balance, transfers, gait for improved overall mobility.  TARGET 12/26/2019 (delayed date due to delayed start-Pt wants OT, PT, speech together)    Time 4    Period Weeks    Status New    Target Date 12/26/19      PT SHORT TERM GOAL #2   Title Pt will perform at least 8 of 10 reps of sit<>stand transfers with no posterior lean or LOB.    Time 4    Period Weeks    Status New    Target Date 12/26/19      PT SHORT TERM GOAL #3   Title Pt will improve posterior push and  release test to recover in less than 2 steps, for improved balance recovery.    Time 4    Period Weeks    Status New    Target Date 12/26/19      PT SHORT TERM GOAL #4   Title Pt will verbalize understanding of fall prevention education in home environment.    Time 4    Period Weeks  Status New    Target Date 12/26/19             PT Long Term Goals - 10/30/19 1659      PT LONG TERM GOAL #1   Title Pt will be independent with progression of HEP for Parkinson's related deficits.  TARGET 01/23/2020 (delayed due to delayed start for coordination of schedules)    Time 8    Period Weeks    Status New    Target Date 01/23/20      PT LONG TERM GOAL #2   Title Pt will improve gait velocity to at least 2.8 ft/sec for improved gait efficiency and safety.    Baseline 2.5 ft/sec    Time 8    Period Weeks    Status New    Target Date 01/23/20      PT LONG TERM GOAL #3   Title Pt will improve MiniBESTest score to at least 23/28 for decreased fall risk.    Time 8    Period Weeks    Status New    Target Date 12/26/19      PT LONG TERM GOAL #4   Title Pt will improve TUG/TUG cognitive score to less than 10% difference for improved ability with dual tasking with gait.    Time 8    Period Weeks    Status New    Target Date 01/23/20      PT LONG TERM GOAL #5   Title Pt/wife will verbalize understanding of local Parkinson's disease-related resources, including ongoing community fitness.    Time 8    Period Weeks    Status New    Target Date 01/23/20                 Plan - 12/09/19 1432    Clinical Impression Statement Treatment session today focused on exercises to reduce stiffness through low back for improved overall posture and positioning.  Pt reports 3/10 back pain at beginning of session that started over the weekend.  After low back flexibility exercises, use of seated SciFit Stepper/gait activity and edcation in posture/positioning, pt rates decrease in pain at end  of session.  Pt's posture/positioning and stiffness may be contributing to pain with some movement patterns.  He will continue to benefit from further skilled PT to address optimal movmeent patterns for improved functional mobility.    Personal Factors and Comorbidities Comorbidity 3+    Comorbidities anxiety, arthritis, GERD, prostate cancer, HTN, s/p R TKR 04/2019    Examination-Activity Limitations Locomotion Level;Transfers;Stand;Stairs    Examination-Participation Restrictions Community Activity;Other   playing with grandchildren   Stability/Clinical Decision Making Evolving/Moderate complexity    Rehab Potential Good    PT Frequency 2x / week    PT Duration 8 weeks   plus eval   PT Treatment/Interventions ADLs/Self Care Home Management;Gait training;Functional mobility training;Therapeutic activities;Therapeutic exercise;Balance training;Neuromuscular re-education;DME Instruction;Patient/family education    PT Next Visit Plan Review additions to HEP (address low back stiffness); work on standing PWR! Moves, use of aerobic machine for flexibility and intensity; gait activities, balance in posterior direction    PT Home Exercise Plan standing PWR moves    Consulted and Agree with Plan of Care Patient           Patient will benefit from skilled therapeutic intervention in order to improve the following deficits and impairments:  Abnormal gait, Difficulty walking, Decreased balance, Decreased mobility, Postural dysfunction, Decreased strength, Impaired flexibility  Visit Diagnosis: Abnormal posture  Muscle  weakness (generalized)  Other abnormalities of gait and mobility     Problem List Patient Active Problem List   Diagnosis Date Noted  . S/P TKR (total knee replacement), right 05/05/2019  . Osteoarthritis of right knee 05/02/2019  . Prostate cancer ([place]) 03/22/2018  . Malignant neoplasm of prostate ([place]) 02/27/2018    Ericberto Padget W. 12/09/2019, 2:35 PM Frazier Butt.,  PT  [facility] [address] [place], [place], [address] Phone: [phone]   Fax:  [phone]  Name: Matthew [name] MRN: [id number] Date of Birth: 1951-05-12

## 2019-12-09 NOTE — Patient Instructions (Signed)
  Please complete the assigned speech therapy homework prior to your next session.  

## 2019-12-09 NOTE — Patient Instructions (Signed)
Provided low back exercises from low back packet:  1) Supine pelvic tilt, 5-10 reps 2)Supine single knee to chest, 3 reps, 30 seconds each 3)Supine trunk rotation (initiating with rocking), then to stretch 3-5 reps 30 second hold each side  Provided handout for bed mobility (log roll position and use of pillows)

## 2019-12-11 ENCOUNTER — Encounter: Payer: Self-pay | Admitting: Occupational Therapy

## 2019-12-11 ENCOUNTER — Other Ambulatory Visit: Payer: Self-pay

## 2019-12-11 ENCOUNTER — Ambulatory Visit: Payer: PPO | Admitting: Speech Pathology

## 2019-12-11 ENCOUNTER — Ambulatory Visit: Payer: PPO | Admitting: Occupational Therapy

## 2019-12-11 ENCOUNTER — Encounter: Payer: Self-pay | Admitting: Physical Therapy

## 2019-12-11 ENCOUNTER — Ambulatory Visit: Payer: PPO | Admitting: Physical Therapy

## 2019-12-11 DIAGNOSIS — M6281 Muscle weakness (generalized): Secondary | ICD-10-CM

## 2019-12-11 DIAGNOSIS — R293 Abnormal posture: Secondary | ICD-10-CM

## 2019-12-11 DIAGNOSIS — R29818 Other symptoms and signs involving the nervous system: Secondary | ICD-10-CM

## 2019-12-11 DIAGNOSIS — R2681 Unsteadiness on feet: Secondary | ICD-10-CM

## 2019-12-11 DIAGNOSIS — R2689 Other abnormalities of gait and mobility: Secondary | ICD-10-CM

## 2019-12-11 DIAGNOSIS — R278 Other lack of coordination: Secondary | ICD-10-CM

## 2019-12-11 DIAGNOSIS — R471 Dysarthria and anarthria: Secondary | ICD-10-CM

## 2019-12-11 NOTE — Therapy (Signed)
[facility] [address] [place], [place], [address] Phone: [phone]   Fax:  [phone]  Physical Therapy Treatment  Patient Details  Name: Matthew [name] MRN: [id number] Date of Birth: [birth year] Referring Provider (PT): Star Age   Encounter Date: 12/11/2019   PT End of Session - 12/11/19 1308    Visit Number 5    Number of Visits 17    Date for PT Re-Evaluation 83/33/83   90 day cert due to delay in start of scheduling, 8-wk POC   Authorization Type [employer]    Progress Note Due on Visit 10    PT Start Time 1107    PT Stop Time 1145    PT Time Calculation (min) 38 min    Activity Tolerance Patient tolerated treatment well;No increased pain   Reported decrease in low back pain at end of session   Behavior During Therapy [facility] for tasks assessed/performed           Past Medical History:  Diagnosis Date  . Anxiety   . Arthritis   . GERD (gastroesophageal reflux disease)    occ remote history  . Hypertension   . Prostate cancer ([place])   . Right knee pain    questionable meniscal tear  . Seasonal allergies     Past Surgical History:  Procedure Laterality Date  . COLONOSCOPY    . Leg Laceration Repair  Left    Age 67  . PELVIC LYMPH NODE DISSECTION Bilateral 03/22/2018   Procedure: PELVIC LYMPH NODE DISSECTION;  Surgeon: Ceasar Mons, MD;  [facility];  Service: Urology;  Laterality: Bilateral;  . PROSTATE BIOPSY    . ROBOT ASSISTED LAPAROSCOPIC RADICAL PROSTATECTOMY N/A 03/22/2018   Procedure: XI ROBOTIC ASSISTED LAPAROSCOPIC PROSTATECTOMY;  Surgeon: Ceasar Mons, MD;  [facility];  Service: Urology;  Laterality: N/A;  . TOTAL KNEE ARTHROPLASTY Right 05/05/2019   Procedure: RIGHT TOTAL KNEE ARTHROPLASTY;  Surgeon: Frederik Pear, MD;  [facility];  Service: Orthopedics;  Laterality: Right;    There were no vitals filed for this visit.   Subjective  Assessment - 12/11/19 1108    Subjective Back was a little stiff when I first got up but was sleeping in a softer mattress.    Patient is accompained by: --   wife   Pertinent History hx of prostate cancer/removal, incontinence, hx of R TKR 04/2019    Patient Stated Goals Pt's goal for therapy is to improve walking, to have strength and be normal again.  Wants to get back to playing guitar.    Currently in Pain? No/denies   was stiff this morning   Pain Onset More than a month ago           Neuro Re-education Reviewed PWR! Moves in standing, provided as HEP last visit.  Pt uses handouts as reference   PWR! Up for improved posture 2 x10 reps B -cues to lower arms to decr L shoulder discomfort and for palms forward.  Cues also for full, terminal knee extension (activate gluts and quads upon standing for full upright posture)   PWR! [name] for improved weighshifting 2 x 10 reps B - cues to look at hand and for optimal positioning through L shoulder to avoid discomfort.   PWR! Twist for improved trunk rotation 2 x 10 reps B -cues for intensity with clap and to look at hand, cues for pivot through foot to increase trunk rotation motion   PWR!  Step for improved step initiation 2 x 10 reps B. - cues for incr foot clearance    Cues provided for intensity, technique. Cues to perform with "big hands" throughout      [facility] Adult PT Treatment/Exercise - 12/11/19 0001      Transfers   Transfers Sit to Stand;Stand to Sit    Sit to Stand 5: Supervision;Without upper extremity assist;From chair/3-in-1    Stand to Sit 5: Supervision;Without upper extremity assist;To chair/3-in-1      Ambulation/Gait   Ambulation/Gait Yes    Ambulation/Gait Assistance 5: Supervision    Ambulation/Gait Assistance Details cues for arm swing, increased heel strike, posture and cadence    Ambulation Distance (Feet) 330 Feet   x 3 and treadmill and between activities   Assistive device None;Other (Comment)   walking  poles with PTA holding end to encourage arm swing   Gait Pattern Step-through pattern;Decreased arm swing - right;Decreased arm swing - left;Decreased step length - right;Decreased step length - left;Right flexed knee in stance;Left flexed knee in stance;Decreased trunk rotation;Trunk flexed    Ambulation Surface Level;Indoor    Gait Comments Performed 3 minutes on treadmill at 1.8 for auditory feedback for improving L foot clearance      Posture/Postural Control   Posture/Postural Control Postural limitations    Postural Limitations Rounded Shoulders;Forward head;Posterior pelvic tilt;Flexed trunk      Knee/Hip Exercises: Aerobic   Stepper Seated SciFit Stepper, BUEs/BLEs, x 8 minutes, Level 1.2, initial RPM at 80, 1 bout of increasing to 100 rpm, Increased to level 1.8 for lat 2 minutes                    PT Short Term Goals - 10/30/19 1655      PT SHORT TERM GOAL #1   Title Pt will be indepedent with Parkinson's specific HEP to address balance, transfers, gait for improved overall mobility.  TARGET 12/26/2019 (delayed date due to delayed start-Pt wants OT, PT, speech together)    Time 4    Period Weeks    Status New    Target Date 12/26/19      PT SHORT TERM GOAL #2   Title Pt will perform at least 8 of 10 reps of sit<>stand transfers with no posterior lean or LOB.    Time 4    Period Weeks    Status New    Target Date 12/26/19      PT SHORT TERM GOAL #3   Title Pt will improve posterior push and release test to recover in less than 2 steps, for improved balance recovery.    Time 4    Period Weeks    Status New    Target Date 12/26/19      PT SHORT TERM GOAL #4   Title Pt will verbalize understanding of fall prevention education in home environment.    Time 4    Period Weeks    Status New    Target Date 12/26/19             PT Long Term Goals - 10/30/19 1659      PT LONG TERM GOAL #1   Title Pt will be independent with progression of HEP for Parkinson's  related deficits.  TARGET 01/23/2020 (delayed due to delayed start for coordination of schedules)    Time 8    Period Weeks    Status New    Target Date 01/23/20      PT LONG TERM GOAL #  2   Title Pt will improve gait velocity to at least 2.8 ft/sec for improved gait efficiency and safety.    Baseline 2.5 ft/sec    Time 8    Period Weeks    Status New    Target Date 01/23/20      PT LONG TERM GOAL #3   Title Pt will improve MiniBESTest score to at least 23/28 for decreased fall risk.    Time 8    Period Weeks    Status New    Target Date 12/26/19      PT LONG TERM GOAL #4   Title Pt will improve TUG/TUG cognitive score to less than 10% difference for improved ability with dual tasking with gait.    Time 8    Period Weeks    Status New    Target Date 01/23/20      PT LONG TERM GOAL #5   Title Pt/wife will verbalize understanding of local Parkinson's disease-related resources, including ongoing community fitness.    Time 8    Period Weeks    Status New    Target Date 01/23/20                 Plan - 12/11/19 1309    Clinical Impression Statement Session focused on gait, intensity with exercise and coordination.  Pt responds well to cues with all activties.  Pt reports back pain seems to be resolved.  Cont PT per POC.    Personal Factors and Comorbidities Comorbidity 3+    Comorbidities anxiety, arthritis, GERD, prostate cancer, HTN, s/p R TKR 04/2019    Examination-Activity Limitations Locomotion Level;Transfers;Stand;Stairs    Examination-Participation Restrictions Community Activity;Other   playing with grandchildren   Stability/Clinical Decision Making Evolving/Moderate complexity    Rehab Potential Good    PT Frequency 2x / week    PT Duration 8 weeks   plus eval   PT Treatment/Interventions ADLs/Self Care Home Management;Gait training;Functional mobility training;Therapeutic activities;Therapeutic exercise;Balance training;Neuromuscular re-education;DME  Instruction;Patient/family education    PT Next Visit Plan work on standing PWR! Moves, use of aerobic machine for flexibility and intensity; gait activities, balance in posterior direction.  Try adding another PWR position.    PT Home Exercise Plan standing PWR moves    Consulted and Agree with Plan of Care Patient           Patient will benefit from skilled therapeutic intervention in order to improve the following deficits and impairments:  Abnormal gait, Difficulty walking, Decreased balance, Decreased mobility, Postural dysfunction, Decreased strength, Impaired flexibility  Visit Diagnosis: Muscle weakness (generalized)  Abnormal posture  Other abnormalities of gait and mobility     Problem List Patient Active Problem List   Diagnosis Date Noted  . S/P TKR (total knee replacement), right 05/05/2019  . Osteoarthritis of right knee 05/02/2019  . Prostate cancer ([place]) 03/22/2018  . Malignant neoplasm of prostate ([place]) 02/27/2018    Narda Bonds, PTA [facility] 12/11/19 1:12 PM Phone: [phone] Fax: [facility] [address] [place], [place], [address] Phone: [phone]   Fax:  [phone]  Name: Matthew [name] MRN: [id number] Date of Birth: 1950/11/27

## 2019-12-11 NOTE — Therapy (Signed)
[facility] [address] [place], [place], [address] Phone: [phone]   Fax:  [phone]  Occupational Therapy Treatment  Patient Details  Name: Matthew [name] MRN: [id number] Date of Birth: 1951/03/29 Referring Provider (OT): Dr. Rexene Alberts   Encounter Date: 12/11/2019   OT End of Session - 12/11/19 1412    Visit Number 4    Number of Visits 25    Date for OT Re-Evaluation 03/02/20    Authorization Type [employer]    Authorization Time Period POC written for 12 weeks , pt goes on vacation after 4 weeks and may need to resume after pt returns- may d/c after 4-8 weeks though    Authorization - Visit Number 4    Authorization - Number of Visits 10    OT Start Time 1020    OT Stop Time 1100    OT Time Calculation (min) 40 min    Activity Tolerance Patient tolerated treatment well    Behavior During Therapy [facility] for tasks assessed/performed           Past Medical History:  Diagnosis Date  . Anxiety   . Arthritis   . GERD (gastroesophageal reflux disease)    occ remote history  . Hypertension   . Prostate cancer ([place])   . Right knee pain    questionable meniscal tear  . Seasonal allergies     Past Surgical History:  Procedure Laterality Date  . COLONOSCOPY    . Leg Laceration Repair  Left    Age 68  . PELVIC LYMPH NODE DISSECTION Bilateral 03/22/2018   Procedure: PELVIC LYMPH NODE DISSECTION;  Surgeon: Ceasar Mons, MD;  Location: [facility];  Service: Urology;  Laterality: Bilateral;  . PROSTATE BIOPSY    . ROBOT ASSISTED LAPAROSCOPIC RADICAL PROSTATECTOMY N/A 03/22/2018   Procedure: XI ROBOTIC ASSISTED LAPAROSCOPIC PROSTATECTOMY;  Surgeon: Ceasar Mons, MD;  Location: [facility];  Service: Urology;  Laterality: N/A;  . TOTAL KNEE ARTHROPLASTY Right 05/05/2019   Procedure: RIGHT TOTAL KNEE ARTHROPLASTY;  Surgeon: Frederik Pear, MD;  Location: [facility];  Service: Orthopedics;  Laterality:  Right;    There were no vitals filed for this visit.   Subjective Assessment - 12/11/19 1024    Subjective  Pt denies pain.  Pt reports difficulty getting in/out of car, putting on seatbelt.    Pertinent History PMH:  PD newly diagnosed , anxiety, arthritis, GERD, prostate cancer, HTN, s/p R TKR 04/2019    Patient Stated Goals to play guitar again    Currently in Pain? No/denies    Pain Onset --             Dealing cards with each thumb with min-mod cueing for large amplitude movement strategies R hand, (min difficulty L hand).  Flipping cards with each hand with min cueing for large amplitude movement strategies.    Educated pt on importance of quality of movement for neuroplasticity.        OT Education - 12/11/19 1410    Education Details Seated PWR! Moves (basic 4) and how they can help functional movements/ADLs    Person(s) Educated Patient    Methods Explanation;Demonstration;Handout;Verbal cues    Comprehension Verbalized understanding;Returned demonstration;Verbal cues required            OT Short Term Goals - 12/03/19 1343      OT SHORT TERM GOAL #1   Title I with PD specific HEP    Time 4    Period  Weeks    Status New    Target Date 01/02/20      OT SHORT TERM GOAL #2   Title Pt will verbalize understanding of adapted strategies to maximize safety and I with ADLs/ IADLs .    Time 4    Period Weeks    Status New    Target Date 01/02/20      OT SHORT TERM GOAL #3   Title Pt will  demonstrate improved LUE functional use for ADLs as evidenced by increasing box/ blocks score by 4 blocks with RUE    Time 4    Period Weeks    Status New      OT SHORT TERM GOAL #4   Title Pt will demonstrate increased ease with dressing as evidenced by decreasing PPT#4(don/ doff jacket) to 25 secs or less    Baseline 29.78    Time 4    Period Weeks    Status New      OT SHORT TERM GOAL #5   Title Pt will retrieve a lightweight object from overhead shelf with -15  elbow extension in RUE    Time 4    Period Weeks    Status New             OT Long Term Goals - 12/03/19 1347      OT LONG TERM GOAL #1   Title Pt will verbalize understanding of ways to prevent future PD related complications and PD community resources.    Time 12    Period Weeks    Status New      OT LONG TERM GOAL #2   Title Pt will demonstrate improved fine motor coordination for ADLs as evidenced by decreasing 9 hole peg test score for RUE by 3 secs    Time 12    Period Weeks    Status New      OT LONG TERM GOAL #3   Title Pt will demonstrate improved ease with dressing as eveidenced by decreasing 3 button/ unbutton time to 30 secs or less    Baseline 34.68    Time 12    Period Weeks    Status New      OT LONG TERM GOAL #4   Title Pt will report increased ease with playing his guitar    Time 12    Period Weeks    Status New      OT LONG TERM GOAL #5   Title Pt will report pain in left shoulder no greater than 2/10 for ADLs.    Time 12    Period Weeks    Status New                 Plan - 12/11/19 1415    Clinical Impression Statement Pt is progressing towards goals.  He responds well to cueing for large amplitude movements.  Pt would benefit from continuing emphasis on large amplitude movements with ADLs/functional tasks and HEP.    OT Occupational Profile and History Detailed Assessment- Review of Records and additional review of physical, cognitive, psychosocial history related to current functional performance    Occupational performance deficits (Please refer to evaluation for details): ADL's;IADL's;Leisure;Social Participation    Body Structure / Function / Physical Skills ADL;Balance;Mobility;Strength;Tone;UE functional use;Flexibility;FMC;Coordination;Decreased knowledge of precautions;GMC;ROM;Decreased knowledge of use of DME;Dexterity;IADL    Rehab Potential Good    Clinical Decision Making Limited treatment options, no task modification  necessary    Comorbidities Affecting Occupational Performance: May have  comorbidities impacting occupational performance    Modification or Assistance to Complete Evaluation  No modification of tasks or assist necessary to complete eval    OT Frequency 2x / week   may d/c after 4-5 weeks dep on progress   OT Duration 12 weeks   plan written for 12 weeks as pt is going on vacation for a week, pt may need to resume following vactions   OT Treatment/Interventions Self-care/ADL training;Therapeutic exercise;Functional Mobility Training;Balance training;Manual Therapy;Neuromuscular education;Ultrasound;Aquatic Therapy;Energy conservation;Therapeutic activities;DME and/or AE instruction;Paraffin;Cryotherapy;Fluidtherapy;Gait Training;Moist Heat;Contrast Bath;Passive range of motion;Patient/family education    Plan ADL strategies, review HEP prn    Recommended Other Services Pt is only scheduled for 4 weeks as he goes on vacation after 4 weeks    Consulted and Agree with Plan of Care Patient           Patient will benefit from skilled therapeutic intervention in order to improve the following deficits and impairments:   Body Structure / Function / Physical Skills: ADL, Balance, Mobility, Strength, Tone, UE functional use, Flexibility, FMC, Coordination, Decreased knowledge of precautions, GMC, ROM, Decreased knowledge of use of DME, Dexterity, IADL       Visit Diagnosis: Other symptoms and signs involving the nervous system  Other lack of coordination  Unsteadiness on feet  Abnormal posture  Other abnormalities of gait and mobility    Problem List Patient Active Problem List   Diagnosis Date Noted  . S/P TKR (total knee replacement), right 05/05/2019  . Osteoarthritis of right knee 05/02/2019  . Prostate cancer ([place]) 03/22/2018  . Malignant neoplasm of prostate ([place]) 02/27/2018    [facility] 12/11/2019, 2:18 PM  [facility] [address] [place], [place], [address] Phone: [phone]   Fax:  [phone]  Name: Matthew [name] MRN: [id number] Date of Birth: [birth year]   Vianne Bulls, OTR/L [facility] [address] [place], [place]  [address] [phone] phone [phone] 12/11/19 2:18 PM

## 2019-12-11 NOTE — Therapy (Signed)
[facility] [address] [place], [place], [address] Phone: [phone]   Fax:  [phone]  Speech Language Pathology Treatment  Patient Details  Name: Matthew [name] MRN: [id number] Date of Birth: 1950/06/25 Referring Provider (SLP): Dr. Rexene Alberts   Encounter Date: 12/11/2019   End of Session - 12/11/19 1252    Visit Number 4    Number of Visits 17    Date for SLP Re-Evaluation 01/31/20    SLP Start Time 1148    SLP Stop Time  1230    SLP Time Calculation (min) 42 min    Activity Tolerance Patient tolerated treatment well           Past Medical History:  Diagnosis Date  . Anxiety   . Arthritis   . GERD (gastroesophageal reflux disease)    occ remote history  . Hypertension   . Prostate cancer ([place])   . Right knee pain    questionable meniscal tear  . Seasonal allergies     Past Surgical History:  Procedure Laterality Date  . COLONOSCOPY    . Leg Laceration Repair  Left    Age 44  . PELVIC LYMPH NODE DISSECTION Bilateral 03/22/2018   Procedure: PELVIC LYMPH NODE DISSECTION;  Surgeon: Ceasar Mons, MD;  [facility]: [facility];  Service: Urology;  Laterality: Bilateral;  . PROSTATE BIOPSY    . ROBOT ASSISTED LAPAROSCOPIC RADICAL PROSTATECTOMY N/A 03/22/2018   Procedure: XI ROBOTIC ASSISTED LAPAROSCOPIC PROSTATECTOMY;  Surgeon: Ceasar Mons, MD;  [facility]: [facility];  Service: Urology;  Laterality: N/A;  . TOTAL KNEE ARTHROPLASTY Right 05/05/2019   Procedure: RIGHT TOTAL KNEE ARTHROPLASTY;  Surgeon: Frederik Pear, MD;  [facility];  Service: Orthopedics;  Laterality: Right;    There were no vitals filed for this visit.   Subjective Assessment - 12/11/19 1146    Subjective "I get in at least one and sometimes 2 a day."    Currently in Pain? No/denies                 ADULT SLP TREATMENT - 12/11/19 1147      General Information   Behavior/Cognition Alert;Cooperative;Pleasant  mood      Treatment Provided   Treatment provided Cognitive-Linquistic      Pain Assessment   Pain Assessment No/denies pain      Cognitive-Linquistic Treatment   Treatment focused on Dysarthria;Patient/family/caregiver education    Skilled Treatment Pt using abdominal breathing at rest once sitting in ST room. Average intensity in conversation was mid 60s dB initially, so SLP used loud /a/ with patient to recalibrate loudness in speech tasks and conversation (mid 80s dB at 30 cm). Required cues for pitch, maintaining consistent effort/intensity.  Everyday sentences averaged upper 70s dB; progressed to sentences with cognitive load average 75dB, with longer utterances (2 sentences +) or heavier cognitive load occasional min cues required. Targeted carryover in simple conversation averaged upper 60s to low 70s dB with rare min cues.      Assessment / Recommendations / Plan   Plan Continue with current plan of care      Progression Toward Goals   Progression toward goals Progressing toward goals              SLP Short Term Goals - 12/11/19 1257      SLP SHORT TERM GOAL #1   Title pt will produce /a/ with average >90 dB at 30 cm over 3 sessions    Time 3  Period Weeks    Status On-going      SLP SHORT TERM GOAL #2   Title pt will demo abdominal breathing with sentences 90% accuracy x 3 sessions    Time 3    Period Weeks    Status On-going      SLP SHORT TERM GOAL #3   Title pt will generate 10 minutes simple conversation with low 70s dB average in 3 sessions    Time 3    Period Weeks    Status On-going      SLP SHORT TERM GOAL #4   Title pt will complete clinical swallowing assessment if indicated.    Time 3    Period Weeks    Status On-going            SLP Long Term Goals - 12/11/19 1257      SLP LONG TERM GOAL #1   Title pt will use abdominal breathing >80% of the time during 10 minute conversation in 3 sessions    Time 7    Period Weeks    Status On-going       SLP LONG TERM GOAL #2   Title pt will maintain WNL vocal quality/intensity in 15 minute conversation outside [place] room x 3 sessions    Time 7    Period Weeks    Status On-going      SLP LONG TERM GOAL #3   Title pt will report fewer requests to repeat himself than prior to ST    Time 7    Period Weeks    Status On-going      SLP LONG TERM GOAL #4   Title pt will tell SLP 3 overt s/s difficulty oropharyngeal dysphagia with modified independence    Time 7    Period Weeks    Status On-going      SLP LONG TERM GOAL #5   Title pt will tell SLP 3 compensations for anomia    Time 7    Period Weeks    Status On-going            Plan - 12/11/19 1253    Clinical Impression Statement Patient presents with 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 He reports increase in requests to repeat himself over the past year. Intelligibility was not affected for this listener in a quiet environment. He reports occasional difficulties swallowing as well as anomia (mostly names); will assess further and add goals, possibly recommend MBS if warranted. I recommend continued skilled ST to address communication and if necessary, dysphagia, in order to improve pt's communication and quality of life.    Speech Therapy Frequency 2x / week    Duration --   8 weeks or 17 visits   Treatment/Interventions Aspiration precaution training;Language facilitation;Environmental controls;Cueing hierarchy;SLP instruction and feedback;Compensatory techniques;Functional tasks;Compensatory strategies;Patient/family education;Diet toleration management by SLP;Other (comment)   MBS if warranted   Potential to Achieve Goals Good    Consulted and Agree with Plan of Care Patient           Patient will benefit from skilled therapeutic intervention in order to improve the following deficits and impairments:   Dysarthria and  anarthria    Problem List Patient Active Problem List   Diagnosis Date Noted  . S/P TKR (total knee replacement), right 05/05/2019  . Osteoarthritis of right knee 05/02/2019  . Prostate cancer ([place]) 03/22/2018  . Malignant neoplasm of prostate ([place]) 02/27/2018  Deneise Lever, [place], CCC-SLP Speech-Language Pathologist  Aliene Altes 12/11/2019, 12:58 PM  [facility] [address] [place], [place], [address] Phone: [phone]   Fax:  [phone]   Name: Arek Spadafore MRN: [id number] Date of Birth: [birth year]

## 2019-12-17 ENCOUNTER — Ambulatory Visit: Payer: PPO

## 2019-12-17 ENCOUNTER — Ambulatory Visit: Payer: PPO | Admitting: Occupational Therapy

## 2019-12-17 ENCOUNTER — Ambulatory Visit: Payer: PPO | Admitting: Physical Therapy

## 2019-12-17 ENCOUNTER — Other Ambulatory Visit: Payer: Self-pay

## 2019-12-17 DIAGNOSIS — R471 Dysarthria and anarthria: Secondary | ICD-10-CM | POA: Diagnosis not present

## 2019-12-17 DIAGNOSIS — R2681 Unsteadiness on feet: Secondary | ICD-10-CM

## 2019-12-17 DIAGNOSIS — R293 Abnormal posture: Secondary | ICD-10-CM

## 2019-12-17 DIAGNOSIS — R1310 Dysphagia, unspecified: Secondary | ICD-10-CM

## 2019-12-17 DIAGNOSIS — R2689 Other abnormalities of gait and mobility: Secondary | ICD-10-CM

## 2019-12-17 DIAGNOSIS — R4701 Aphasia: Secondary | ICD-10-CM

## 2019-12-17 DIAGNOSIS — R29818 Other symptoms and signs involving the nervous system: Secondary | ICD-10-CM

## 2019-12-17 DIAGNOSIS — R278 Other lack of coordination: Secondary | ICD-10-CM

## 2019-12-17 NOTE — Therapy (Signed)
[facility] [address] [place], [place], [address] Phone: [phone]   Fax:  [phone]  Speech Language Pathology Treatment  Patient Details  Name: Matthew [name] MRN: [id number] Date of Birth: July 08, 1950 Referring Provider (SLP): Dr. Rexene Alberts   Encounter Date: 12/17/2019   End of Session - 12/17/19 1400    Visit Number 5    Number of Visits 17    Date for SLP Re-Evaluation 01/31/20    SLP Start Time 1319    SLP Stop Time  1400    SLP Time Calculation (min) 41 min    Activity Tolerance Patient tolerated treatment well           Past Medical History:  Diagnosis Date  . Anxiety   . Arthritis   . GERD (gastroesophageal reflux disease)    occ remote history  . Hypertension   . Prostate cancer ([place])   . Right knee pain    questionable meniscal tear  . Seasonal allergies     Past Surgical History:  Procedure Laterality Date  . COLONOSCOPY    . Leg Laceration Repair  Left    Age 69  . PELVIC LYMPH NODE DISSECTION Bilateral 03/22/2018   Procedure: PELVIC LYMPH NODE DISSECTION;  Surgeon: Ceasar Mons, MD;  [facility]: [facility];  Service: Urology;  Laterality: Bilateral;  . PROSTATE BIOPSY    . ROBOT ASSISTED LAPAROSCOPIC RADICAL PROSTATECTOMY N/A 03/22/2018   Procedure: XI ROBOTIC ASSISTED LAPAROSCOPIC PROSTATECTOMY;  Surgeon: Ceasar Mons, MD;  [facility];  Service: Urology;  Laterality: N/A;  . TOTAL KNEE ARTHROPLASTY Right 05/05/2019   Procedure: RIGHT TOTAL KNEE ARTHROPLASTY;  Surgeon: Frederik Pear, MD;  [facility]: [facility];  Service: Orthopedics;  Laterality: Right;    There were no vitals filed for this visit.   Subjective Assessment - 12/17/19 1323    Subjective "I felt a litle strange yelling when people are in my house." (pt had company over the weekend)    Currently in Pain? No/denies                 ADULT SLP TREATMENT - 12/17/19 1324      General Information    Behavior/Cognition Alert;Cooperative;Pleasant mood      Treatment Provided   Treatment provided Cognitive-Linquistic      Cognitive-Linquistic Treatment   Treatment focused on Dysarthria    Skilled Treatment Pt using abdominal breathing at rest once sitting in ST room. Average intensity in conversation was mid 60s dB initially, so SLP used loud /a/ with patient to recalibrate loudness in speech tasks and conversation (mid 80s dB at 30 cm). Required cues for pitch, maintaining consistent effort/intensity.  Everyday sentences averaged upper 70s dB; progressed to sentences with cognitive load average 75dB, with longer utterances (2 sentences +) or heavier cognitive load occasional min cues required. Targeted carryover in mod complex conversation for 22 minutes- averaged low 70s dB with rare min cues. Pt with excessive throat clearing - SLP encouraged pt to take water instead of throat clearing.       Assessment / Recommendations / Plan   Plan Continue with current plan of care      Progression Toward Goals   Progression toward goals Progressing toward goals              SLP Short Term Goals - 12/17/19 1638      SLP SHORT TERM GOAL #1   Title pt will produce /a/ with average >90 dB at 30  cm over 3 sessions    Time 2    Period Weeks    Status On-going      SLP SHORT TERM GOAL #2   Title pt will demo abdominal breathing with sentences 90% accuracy x 3 sessions    Time 2    Period Weeks    Status On-going      SLP SHORT TERM GOAL #3   Title pt will generate 10 minutes simple conversation with low 70s dB average in 3 sessions    Baseline 12-17-19    Time 2    Period Weeks    Status On-going      SLP SHORT TERM GOAL #4   Title pt will complete clinical swallowing assessment if indicated.    Time 2    Period Weeks    Status On-going            SLP Long Term Goals - 12/17/19 1638      SLP LONG TERM GOAL #1   Title pt will use abdominal breathing >80% of the time during  10 minute conversation in 3 sessions    Time 6    Period Weeks    Status On-going      SLP LONG TERM GOAL #2   Title pt will maintain WNL vocal quality/intensity in 15 minute conversation outside [place] room x 3 sessions    Time 6    Period Weeks    Status On-going      SLP LONG TERM GOAL #3   Title pt will report fewer requests to repeat himself than prior to ST    Time 6    Period Weeks    Status On-going      SLP LONG TERM GOAL #4   Title pt will tell SLP 3 overt s/s difficulty oropharyngeal dysphagia with modified independence    Time 6    Period Weeks    Status On-going      SLP LONG TERM GOAL #5   Title pt will tell SLP 3 compensations for anomia    Time 6    Period Weeks    Status On-going             Patient will benefit from skilled therapeutic intervention in order to improve the following deficits and impairments:   Dysarthria and anarthria  Aphasia  Dysphagia, unspecified type    Problem List Patient Active Problem List   Diagnosis Date Noted  . S/P TKR (total knee replacement), right 05/05/2019  . Osteoarthritis of right knee 05/02/2019  . Prostate cancer ([place]) 03/22/2018  . Malignant neoplasm of prostate ([place]) 02/27/2018    [facility] ,[place], [place]  12/17/2019, 4:39 PM  [facility] [address] [place], [place], [address] Phone: [phone]   Fax:  [phone]   Name: Matthew [name] MRN: [id number] Date of Birth: 1950-09-15

## 2019-12-17 NOTE — Patient Instructions (Signed)

## 2019-12-17 NOTE — Therapy (Signed)
[facility] [address] [place], [place], [address] Phone: [phone]   Fax:  [phone]  Occupational Therapy Treatment  Patient Details  Name: Matthew [name] MRN: [id number] Date of Birth: [birth year] Referring Provider (OT): Dr. Rexene Alberts   Encounter Date: 12/17/2019   OT End of Session - 12/17/19 1605    Visit Number 5    Number of Visits 25    Date for OT Re-Evaluation 03/02/20    Authorization Type [employer]    Authorization Time Period POC written for 12 weeks , pt goes on vacation after 4 weeks and may need to resume after pt returns- may d/c after 4-8 weeks though    Authorization - Visit Number 5    Authorization - Number of Visits 10    OT Start Time 1448    OT Stop Time 1530    OT Time Calculation (min) 42 min    Activity Tolerance Patient tolerated treatment well    Behavior During Therapy [facility] for tasks assessed/performed           Past Medical History:  Diagnosis Date  . Anxiety   . Arthritis   . GERD (gastroesophageal reflux disease)    occ remote history  . Hypertension   . Prostate cancer ([place])   . Right knee pain    questionable meniscal tear  . Seasonal allergies     Past Surgical History:  Procedure Laterality Date  . COLONOSCOPY    . Leg Laceration Repair  Left    Age 69  . PELVIC LYMPH NODE DISSECTION Bilateral 03/22/2018   Procedure: PELVIC LYMPH NODE DISSECTION;  Surgeon: Ceasar Mons, MD;  Location: [facility];  Service: Urology;  Laterality: Bilateral;  . PROSTATE BIOPSY    . ROBOT ASSISTED LAPAROSCOPIC RADICAL PROSTATECTOMY N/A 03/22/2018   Procedure: XI ROBOTIC ASSISTED LAPAROSCOPIC PROSTATECTOMY;  Surgeon: Ceasar Mons, MD;  Location: [facility];  Service: Urology;  Laterality: N/A;  . TOTAL KNEE ARTHROPLASTY Right 05/05/2019   Procedure: RIGHT TOTAL KNEE ARTHROPLASTY;  Surgeon: Frederik Pear, MD;  Location: [facility];  Service: Orthopedics;  Laterality:  Right;    There were no vitals filed for this visit.   Subjective Assessment - 12/17/19 1607    Currently in Pain? No/denies    Pain Onset More than a month ago                  Flipping/ dealing cards with big movements, min v.c              OT Education - 12/17/19 1602    Education Details Began education regarding big movements with ADLs- handout issued and pt practiced donning jacket and cutting food with adapted techniques. Reviewed PWR! basic 4 in seated, 10 reps each, education regarding modifed quadraped PWR!- issue handout next visit    Person(s) Educated Patient    Methods Explanation;Demonstration;Verbal cues;Handout    Comprehension Verbalized understanding;Returned demonstration;Verbal cues required            OT Short Term Goals - 12/03/19 1343      OT SHORT TERM GOAL #1   Title I with PD specific HEP    Time 4    Period Weeks    Status New    Target Date 01/02/20      OT SHORT TERM GOAL #2   Title Pt will verbalize understanding of adapted strategies to maximize safety and I with ADLs/ IADLs .    Time 4  Period Weeks    Status New    Target Date 01/02/20      OT SHORT TERM GOAL #3   Title Pt will  demonstrate improved LUE functional use for ADLs as evidenced by increasing box/ blocks score by 4 blocks with RUE    Time 4    Period Weeks    Status New      OT SHORT TERM GOAL #4   Title Pt will demonstrate increased ease with dressing as evidenced by decreasing PPT#4(don/ doff jacket) to 25 secs or less    Baseline 29.78    Time 4    Period Weeks    Status New      OT SHORT TERM GOAL #5   Title Pt will retrieve a lightweight object from overhead shelf with -15 elbow extension in RUE    Time 4    Period Weeks    Status New             OT Long Term Goals - 12/03/19 1347      OT LONG TERM GOAL #1   Title Pt will verbalize understanding of ways to prevent future PD related complications and PD community resources.     Time 12    Period Weeks    Status New      OT LONG TERM GOAL #2   Title Pt will demonstrate improved fine motor coordination for ADLs as evidenced by decreasing 9 hole peg test score for RUE by 3 secs    Time 12    Period Weeks    Status New      OT LONG TERM GOAL #3   Title Pt will demonstrate improved ease with dressing as eveidenced by decreasing 3 button/ unbutton time to 30 secs or less    Baseline 34.68    Time 12    Period Weeks    Status New      OT LONG TERM GOAL #4   Title Pt will report increased ease with playing his guitar    Time 12    Period Weeks    Status New      OT LONG TERM GOAL #5   Title Pt will report pain in left shoulder no greater than 2/10 for ADLs.    Time 12    Period Weeks    Status New                 Plan - 12/17/19 1604    Clinical Impression Statement Pt is progressing towards goals. He demonstrates improving ADL perfromance using larger amplitude movements.    OT Occupational Profile and History Detailed Assessment- Review of Records and additional review of physical, cognitive, psychosocial history related to current functional performance    Occupational performance deficits (Please refer to evaluation for details): ADL's;IADL's;Leisure;Social Participation    Body Structure / Function / Physical Skills ADL;Balance;Mobility;Strength;Tone;UE functional use;Flexibility;FMC;Coordination;Decreased knowledge of precautions;GMC;ROM;Decreased knowledge of use of DME;Dexterity;IADL    Rehab Potential Good    Clinical Decision Making Limited treatment options, no task modification necessary    Comorbidities Affecting Occupational Performance: May have comorbidities impacting occupational performance    Modification or Assistance to Complete Evaluation  No modification of tasks or assist necessary to complete eval    OT Frequency 2x / week   may d/c after 4-5 weeks dep on progress   OT Duration 12 weeks   plan written for 12 weeks as pt is  going on vacation for a week, pt may  need to resume following vactions   OT Treatment/Interventions Self-care/ADL training;Therapeutic exercise;Functional Mobility Training;Balance training;Manual Therapy;Neuromuscular education;Ultrasound;Aquatic Therapy;Energy conservation;Therapeutic activities;DME and/or AE instruction;Paraffin;Cryotherapy;Fluidtherapy;Gait Training;Moist Heat;Contrast Bath;Passive range of motion;Patient/family education    Plan review modifed quadraped PWR! issue handout    Recommended Other Services Pt is only scheduled for 4 weeks as he goes on vacation after 4 weeks    Consulted and Agree with Plan of Care Patient           Patient will benefit from skilled therapeutic intervention in order to improve the following deficits and impairments:   Body Structure / Function / Physical Skills: ADL, Balance, Mobility, Strength, Tone, UE functional use, Flexibility, FMC, Coordination, Decreased knowledge of precautions, GMC, ROM, Decreased knowledge of use of DME, Dexterity, IADL       Visit Diagnosis: Other symptoms and signs involving the nervous system  Other lack of coordination  Unsteadiness on feet  Abnormal posture  Other abnormalities of gait and mobility    Problem List Patient Active Problem List   Diagnosis Date Noted  . S/P TKR (total knee replacement), right 05/05/2019  . Osteoarthritis of right knee 05/02/2019  . Prostate cancer ([place]) 03/22/2018  . Malignant neoplasm of prostate ([place]) 02/27/2018    Matthew [name] 12/17/2019, 4:07 PM  [facility] [address] [place], [place], [address] Phone: [phone]   Fax:  [phone]  Name: Matthew [name] MRN: [id number] Date of Birth: [birth year]

## 2019-12-17 NOTE — Therapy (Signed)
[facility] [address] [place], [place], [address] Phone: [phone]   Fax:  [phone]  Physical Therapy Treatment  Patient Details  Name: Matthew [name] MRN: [id number] Date of Birth: 1950-12-21 Referring Provider (PT): Star Age   Encounter Date: 12/17/2019   PT End of Session - 12/17/19 1530    Visit Number 6    Number of Visits 17    Date for PT Re-Evaluation 40/97/35   90 day cert due to delay in start of scheduling, 8-wk POC   Authorization Type [employer]    Progress Note Due on Visit 10    PT Start Time 1450    PT Stop Time 1530    PT Time Calculation (min) 40 min    Activity Tolerance Patient tolerated treatment well    Behavior During Therapy [facility] for tasks assessed/performed           Past Medical History:  Diagnosis Date  . Anxiety   . Arthritis   . GERD (gastroesophageal reflux disease)    occ remote history  . Hypertension   . Prostate cancer ([place])   . Right knee pain    questionable meniscal tear  . Seasonal allergies     Past Surgical History:  Procedure Laterality Date  . COLONOSCOPY    . Leg Laceration Repair  Left    Age 9  . PELVIC LYMPH NODE DISSECTION Bilateral 03/22/2018   Procedure: PELVIC LYMPH NODE DISSECTION;  Surgeon: Ceasar Mons, MD;  Location: [facility];  Service: Urology;  Laterality: Bilateral;  . PROSTATE BIOPSY    . ROBOT ASSISTED LAPAROSCOPIC RADICAL PROSTATECTOMY N/A 03/22/2018   Procedure: XI ROBOTIC ASSISTED LAPAROSCOPIC PROSTATECTOMY;  Surgeon: Ceasar Mons, MD;  [facility]: [facility];  Service: Urology;  Laterality: N/A;  . TOTAL KNEE ARTHROPLASTY Right 05/05/2019   Procedure: RIGHT TOTAL KNEE ARTHROPLASTY;  Surgeon: Frederik Pear, MD;  Location: [facility];  Service: Orthopedics;  Laterality: Right;    There were no vitals filed for this visit.   Subjective Assessment - 12/17/19 1451    Subjective Back is good; no other  changes.    Patient is accompained by: --   wife   Pertinent History hx of prostate cancer/removal, incontinence, hx of R TKR 04/2019    Patient Stated Goals Pt's goal for therapy is to improve walking, to have strength and be normal again.  Wants to get back to playing guitar.    Currently in Pain? No/denies    Pain Onset More than a month ago                             [facility] Adult PT Treatment/Exercise - 12/17/19 1452      Transfers   Transfers Sit to Stand;Stand to Sit    Sit to Stand 5: Supervision;Without upper extremity assist;From chair/3-in-1    Stand to Sit 5: Supervision;Without upper extremity assist;To chair/3-in-1    Number of Reps 2 sets   5 reps from mat      Ambulation/Gait   Ambulation/Gait --      Knee/Hip Exercises: Aerobic   Stepper Seated SciFit Stepper, BUEs/BLEs, x 8 minutes, Level 1.8, initial RPM at 60, then maintains RPM > 74 throughout.; with conversation, RPM decreases to 66; with increased attention to speed, pt able to increase to mid 80-s RPM.               Balance Exercises -  12/17/19 0001      Balance Exercises: Standing   Stepping Strategy Posterior;10 reps;Limitations;Anterior;UE support;Lateral;Foam/compliant surface   Solid surface and foam surface, 10 reps each   Stepping Strategy Limitations Cues for technique (in posterior direction cues for hinge at hips), foot clearance, weigthshifting, cues to keep R knee extended in stance and have increased R hip excursion forward with anterior weigthshfiting.  On foam, cues for increased step height and foot clearance.    Other Standing Exercises STagger stance rocking forward/back at counter, x 10 reps each position, with cues for improved weigthshfiting and poistioning.  Then additional set of 10, with added single arm swing (other UE supported at counter).    Other Standing Exercises Comments Dynamic balance activities 20-25 ft in gym area:  forward marching x 2 laps, with added  reciprocal arm swing x 2 laps (cues for sequence, step height); then forward step/stop with reciprocal arm swing, x 2 laps, backwards walking with cues for widened BOS and equal step length x 2 laps, sidestepping with coordinated arms x 2 laps, cues for widened BOS for better balance in return to midline.           Short distance gait between activities in gym-pt noted to have increased step length and reciprocal arm swing with short bouts of gait.    PT Short Term Goals - 10/30/19 1655      PT SHORT TERM GOAL #1   Title Pt will be indepedent with Parkinson's specific HEP to address balance, transfers, gait for improved overall mobility.  TARGET 12/26/2019 (delayed date due to delayed start-Pt wants OT, PT, speech together)    Time 4    Period Weeks    Status New    Target Date 12/26/19      PT SHORT TERM GOAL #2   Title Pt will perform at least 8 of 10 reps of sit<>stand transfers with no posterior lean or LOB.    Time 4    Period Weeks    Status New    Target Date 12/26/19      PT SHORT TERM GOAL #3   Title Pt will improve posterior push and release test to recover in less than 2 steps, for improved balance recovery.    Time 4    Period Weeks    Status New    Target Date 12/26/19      PT SHORT TERM GOAL #4   Title Pt will verbalize understanding of fall prevention education in home environment.    Time 4    Period Weeks    Status New    Target Date 12/26/19             PT Long Term Goals - 10/30/19 1659      PT LONG TERM GOAL #1   Title Pt will be independent with progression of HEP for Parkinson's related deficits.  TARGET 01/23/2020 (delayed due to delayed start for coordination of schedules)    Time 8    Period Weeks    Status New    Target Date 01/23/20      PT LONG TERM GOAL #2   Title Pt will improve gait velocity to at least 2.8 ft/sec for improved gait efficiency and safety.    Baseline 2.5 ft/sec    Time 8    Period Weeks    Status New    Target Date  01/23/20      PT LONG TERM GOAL #3   Title Pt will improve  MiniBESTest score to at least 23/28 for decreased fall risk.    Time 8    Period Weeks    Status New    Target Date 12/26/19      PT LONG TERM GOAL #4   Title Pt will improve TUG/TUG cognitive score to less than 10% difference for improved ability with dual tasking with gait.    Time 8    Period Weeks    Status New    Target Date 01/23/20      PT LONG TERM GOAL #5   Title Pt/wife will verbalize understanding of local Parkinson's disease-related resources, including ongoing community fitness.    Time 8    Period Weeks    Status New    Target Date 01/23/20                 Plan - 12/17/19 1539    Clinical Impression Statement Initiated session with aerobic activity (noted slowed with conversational tasks), then followed with step and weightshifting and balance recovery/dynamic balance exercises.  He requires cues for technique for increased hinging at hips in posterior direction and with RLE SLS, he does not always achieve terminal knee extension, with causes some imbalance on RLE.  He will continue to benefit from skilled PT to further address balance and gait and posture.    Personal Factors and Comorbidities Comorbidity 3+    Comorbidities anxiety, arthritis, GERD, prostate cancer, HTN, s/p R TKR 04/2019    Examination-Activity Limitations Locomotion Level;Transfers;Stand;Stairs    Examination-Participation Restrictions Community Activity;Other   playing with grandchildren   Stability/Clinical Decision Making Evolving/Moderate complexity    Rehab Potential Good    PT Frequency 2x / week    PT Duration 8 weeks   plus eval   PT Treatment/Interventions ADLs/Self Care Home Management;Gait training;Functional mobility training;Therapeutic activities;Therapeutic exercise;Balance training;Neuromuscular re-education;DME Instruction;Patient/family education    PT Next Visit Plan Add forward/back step and weigthshift to HEP;  exercises to encourage terminal knee extension in stance; discuss schedule and POC    PT Home Exercise Plan standing PWR moves    Consulted and Agree with Plan of Care Patient           Patient will benefit from skilled therapeutic intervention in order to improve the following deficits and impairments:  Abnormal gait, Difficulty walking, Decreased balance, Decreased mobility, Postural dysfunction, Decreased strength, Impaired flexibility  Visit Diagnosis: Unsteadiness on feet  Abnormal posture  Other abnormalities of gait and mobility     Problem List Patient Active Problem List   Diagnosis Date Noted  . S/P TKR (total knee replacement), right 05/05/2019  . Osteoarthritis of right knee 05/02/2019  . Prostate cancer ([place]) 03/22/2018  . Malignant neoplasm of prostate ([place]) 02/27/2018    Layani Foronda W. 12/17/2019, 5:57 PM  Frazier Butt., PT   [facility] [address] [place], [place], [address] Phone: [phone]   Fax:  [phone]  Name: Keanon Bevins MRN: [id number] Date of Birth: Jul 14, 1950

## 2019-12-19 ENCOUNTER — Ambulatory Visit: Payer: PPO | Admitting: Occupational Therapy

## 2019-12-19 ENCOUNTER — Ambulatory Visit: Payer: PPO

## 2019-12-19 ENCOUNTER — Other Ambulatory Visit: Payer: Self-pay

## 2019-12-19 ENCOUNTER — Ambulatory Visit: Payer: PPO | Admitting: Physical Therapy

## 2019-12-19 ENCOUNTER — Encounter: Payer: Self-pay | Admitting: Occupational Therapy

## 2019-12-19 DIAGNOSIS — R293 Abnormal posture: Secondary | ICD-10-CM

## 2019-12-19 DIAGNOSIS — R2681 Unsteadiness on feet: Secondary | ICD-10-CM

## 2019-12-19 DIAGNOSIS — R471 Dysarthria and anarthria: Secondary | ICD-10-CM

## 2019-12-19 DIAGNOSIS — R4701 Aphasia: Secondary | ICD-10-CM

## 2019-12-19 DIAGNOSIS — R1310 Dysphagia, unspecified: Secondary | ICD-10-CM

## 2019-12-19 DIAGNOSIS — R2689 Other abnormalities of gait and mobility: Secondary | ICD-10-CM

## 2019-12-19 DIAGNOSIS — R29818 Other symptoms and signs involving the nervous system: Secondary | ICD-10-CM

## 2019-12-19 DIAGNOSIS — R278 Other lack of coordination: Secondary | ICD-10-CM

## 2019-12-19 NOTE — Therapy (Signed)
[facility] [address] [place], [place], [address] Phone: [phone]   Fax:  [phone]  Physical Therapy Treatment  Patient Details  Name: Matthew [name] MRN: [id number] Date of Birth: 1950-08-02 Referring Provider (PT): Star Age   Encounter Date: 12/19/2019   PT End of Session - 12/19/19 1431    Visit Number 7    Number of Visits 17    Date for PT Re-Evaluation 92/11/94   90 day cert due to delay in start of scheduling, 8-wk POC   Authorization Type [employer]    Progress Note Due on Visit 10    PT Start Time 1238    PT Stop Time 1316    PT Time Calculation (min) 38 min    Activity Tolerance Patient tolerated treatment well    Behavior During Therapy [facility] for tasks assessed/performed           Past Medical History:  Diagnosis Date  . Anxiety   . Arthritis   . GERD (gastroesophageal reflux disease)    occ remote history  . Hypertension   . Prostate cancer ([place])   . Right knee pain    questionable meniscal tear  . Seasonal allergies     Past Surgical History:  Procedure Laterality Date  . COLONOSCOPY    . Leg Laceration Repair  Left    Age 69  . PELVIC LYMPH NODE DISSECTION Bilateral 03/22/2018   Procedure: PELVIC LYMPH NODE DISSECTION;  Surgeon: Ceasar Mons, MD;  Location: [facility];  Service: Urology;  Laterality: Bilateral;  . PROSTATE BIOPSY    . ROBOT ASSISTED LAPAROSCOPIC RADICAL PROSTATECTOMY N/A 03/22/2018   Procedure: XI ROBOTIC ASSISTED LAPAROSCOPIC PROSTATECTOMY;  Surgeon: Ceasar Mons, MD;  Location: [facility];  Service: Urology;  Laterality: N/A;  . TOTAL KNEE ARTHROPLASTY Right 05/05/2019   Procedure: RIGHT TOTAL KNEE ARTHROPLASTY;  Surgeon: Frederik Pear, MD;  Location: [facility];  Service: Orthopedics;  Laterality: Right;    There were no vitals filed for this visit.   Subjective Assessment - 12/19/19 1239    Subjective Planning to make additional  appointments for when I come back from vacation.    Patient is accompained by: --   wife   Pertinent History hx of prostate cancer/removal, incontinence, hx of R TKR 04/2019    Patient Stated Goals Pt's goal for therapy is to improve walking, to have strength and be normal again.  Wants to get back to playing guitar.    Currently in Pain? No/denies    Pain Onset More than a month ago                             [facility] Adult PT Treatment/Exercise - 12/19/19 0001      Transfers   Transfers Sit to Stand;Stand to Sit    Sit to Stand 5: Supervision;Without upper extremity assist;From chair/3-in-1    Stand to Sit 5: Supervision;Without upper extremity assist;To chair/3-in-1    Number of Reps Other sets (comment);Other reps (comment)   4 sets of 5 reps   Transfer Cueing Cues for technique from progressively lower surfaces:  mat surface, 18" chair, 16" chair, then 15" step    Comments Pt able to perform each with ease, but educated pt based on upcoming trip to beach, where he may be using lower suface chairs.      Ambulation/Gait   Ambulation/Gait Yes    Ambulation/Gait Assistance 5: Supervision  Ambulation/Gait Assistance Details Cues for relaxed arm swing, upright posture, step length    Ambulation Distance (Feet) 460 Feet   then additional short gait distances b/t activities   Assistive device None;Other (Comment)    Ambulation Surface Level;Indoor    Gait Comments Pt tends to get off-sequence with arm swing, tactile cues provided at shoulders to incorporate more relaxed trunk rotation to initiate reciprocal arm swing.               Balance Exercises - 12/19/19 0001      Balance Exercises: Standing   Stepping Strategy Posterior;10 reps;Anterior;UE support    Stepping Strategy Limitations Performed at counter separately, with cues for technique, especially for extended knee position; then progressed to anterior<>posterior step and weightshift x 10 reps with UE  support    Step Ups Forward;Lateral;4 inch;UE support 1;Limitations    Step Ups Limitations Standing beside counter:  forward step up/up, down/down x 10 reps each leg leading, then single limb step up x 10 reps each leg, cues for terminal knee extension; then lateral single limb step ups x 10 reps each leg.    Retro Gait Foam/compliant surface;Upper extremity support;3 reps   Forward/back-cues for foot clearance   Sidestepping Upper extremity support;Foam/compliant support;3 reps   Cues for foot clearance   Marching Foam/compliant surface;Upper extremity assist 1;Forwards   3 reps forward/back along counter   Other Standing Exercises Stagger stance rocking forward/back with added bilateral/reciprocal arm swing, 10 reps x 2 sets for improved arm swing and coordinated UE movements    Other Standing Exercises Comments Varied direction stepping activity for improved dual tasking for balance with varied directions and with added UE motions, performed x 4 reps with progressive difficulty.  Pt able to maintain balance and use coordinated UE patterns wtih stepping.             PT Education - 12/19/19 1430    Education Details Addition to HEP    Person(s) Educated Patient    Methods Explanation;Demonstration;Handout;Verbal cues    Comprehension Verbalized understanding;Returned demonstration;Verbal cues required;Need further instruction            PT Short Term Goals - 10/30/19 1655      PT SHORT TERM GOAL #1   Title Pt will be indepedent with Parkinson's specific HEP to address balance, transfers, gait for improved overall mobility.  TARGET 12/26/2019 (delayed date due to delayed start-Pt wants OT, PT, speech together)    Time 4    Period Weeks    Status New    Target Date 12/26/19      PT SHORT TERM GOAL #2   Title Pt will perform at least 8 of 10 reps of sit<>stand transfers with no posterior lean or LOB.    Time 4    Period Weeks    Status New    Target Date 12/26/19      PT SHORT  TERM GOAL #3   Title Pt will improve posterior push and release test to recover in less than 2 steps, for improved balance recovery.    Time 4    Period Weeks    Status New    Target Date 12/26/19      PT SHORT TERM GOAL #4   Title Pt will verbalize understanding of fall prevention education in home environment.    Time 4    Period Weeks    Status New    Target Date 12/26/19  PT Long Term Goals - 10/30/19 1659      PT LONG TERM GOAL #1   Title Pt will be independent with progression of HEP for Parkinson's related deficits.  TARGET 01/23/2020 (delayed due to delayed start for coordination of schedules)    Time 8    Period Weeks    Status New    Target Date 01/23/20      PT LONG TERM GOAL #2   Title Pt will improve gait velocity to at least 2.8 ft/sec for improved gait efficiency and safety.    Baseline 2.5 ft/sec    Time 8    Period Weeks    Status New    Target Date 01/23/20      PT LONG TERM GOAL #3   Title Pt will improve MiniBESTest score to at least 23/28 for decreased fall risk.    Time 8    Period Weeks    Status New    Target Date 12/26/19      PT LONG TERM GOAL #4   Title Pt will improve TUG/TUG cognitive score to less than 10% difference for improved ability with dual tasking with gait.    Time 8    Period Weeks    Status New    Target Date 01/23/20      PT LONG TERM GOAL #5   Title Pt/wife will verbalize understanding of local Parkinson's disease-related resources, including ongoing community fitness.    Time 8    Period Weeks    Status New    Target Date 01/23/20                 Plan - 12/19/19 1431    Clinical Impression Statement Continued skilled PT session today address anterior/posterior direction stepping, addition of UE tasks, and progression to varied direction stepping with additional UE coordinated movements.  Pt continues to require cueing at times, remains motivated for therapy activities.    Personal Factors and  Comorbidities Comorbidity 3+    Comorbidities anxiety, arthritis, GERD, prostate cancer, HTN, s/p R TKR 04/2019    Examination-Activity Limitations Locomotion Level;Transfers;Stand;Stairs    Examination-Participation Restrictions Community Activity;Other   playing with grandchildren   Stability/Clinical Decision Making Evolving/Moderate complexity    Rehab Potential Good    PT Frequency 2x / week    PT Duration 8 weeks   plus eval   PT Treatment/Interventions ADLs/Self Care Home Management;Gait training;Functional mobility training;Therapeutic activities;Therapeutic exercise;Balance training;Neuromuscular re-education;DME Instruction;Patient/family education    PT Next Visit Plan Review forward/back step and weigthshift to HEP; exercises to encourage terminal knee extension in stance; check STGs next week    PT Home Exercise Plan standing PWR moves    Consulted and Agree with Plan of Care Patient           Patient will benefit from skilled therapeutic intervention in order to improve the following deficits and impairments:  Abnormal gait, Difficulty walking, Decreased balance, Decreased mobility, Postural dysfunction, Decreased strength, Impaired flexibility  Visit Diagnosis: Unsteadiness on feet  Abnormal posture  Other abnormalities of gait and mobility     Problem List Patient Active Problem List   Diagnosis Date Noted  . S/P TKR (total knee replacement), right 05/05/2019  . Osteoarthritis of right knee 05/02/2019  . Prostate cancer ([place]) 03/22/2018  . Malignant neoplasm of prostate ([place]) 02/27/2018    Arryn Terrones W. 12/19/2019, 2:36 PM  [place]ier Butt., PT   [facility] [address] [place], [place], [address]  Phone: [phone]   Fax:  [phone]  Name: Jonathyn Carothers MRN: [id number] Date of Birth: [birth year]

## 2019-12-19 NOTE — Therapy (Signed)
[facility] [address] [place], [place], [address] Phone: [phone]   Fax:  [phone]  Speech Language Pathology Treatment  Patient Details  Name: Matthew [name] MRN: [id number] Date of Birth: September 09, 1950 Referring Provider (SLP): Dr. Rexene Alberts   Encounter Date: 12/19/2019   End of Session - 12/19/19 1523    Visit Number 6    Number of Visits 17    Date for SLP Re-Evaluation 01/31/20    SLP Start Time 7622    SLP Stop Time  1400    SLP Time Calculation (min) 42 min    Activity Tolerance Patient tolerated treatment well           Past Medical History:  Diagnosis Date  . Anxiety   . Arthritis   . GERD (gastroesophageal reflux disease)    occ remote history  . Hypertension   . Prostate cancer ([place])   . Right knee pain    questionable meniscal tear  . Seasonal allergies     Past Surgical History:  Procedure Laterality Date  . COLONOSCOPY    . Leg Laceration Repair  Left    Age 69  . PELVIC LYMPH NODE DISSECTION Bilateral 03/22/2018   Procedure: PELVIC LYMPH NODE DISSECTION;  Surgeon: Ceasar Mons, MD;  [facility];  Service: Urology;  Laterality: Bilateral;  . PROSTATE BIOPSY    . ROBOT ASSISTED LAPAROSCOPIC RADICAL PROSTATECTOMY N/A 03/22/2018   Procedure: XI ROBOTIC ASSISTED LAPAROSCOPIC PROSTATECTOMY;  Surgeon: Ceasar Mons, MD;  [facility];  Service: Urology;  Laterality: N/A;  . TOTAL KNEE ARTHROPLASTY Right 05/05/2019   Procedure: RIGHT TOTAL KNEE ARTHROPLASTY;  Surgeon: Frederik Pear, MD;  [facility];  Service: Orthopedics;  Laterality: Right;    There were no vitals filed for this visit.   Subjective Assessment - 12/19/19 1331    Subjective Pt states he is asked less frequently to repeat than prior to ST- 'Once or twice in the last week!"    Currently in Pain? No/denies                 ADULT SLP TREATMENT - 12/19/19 1332      General  Information   Behavior/Cognition Alert;Cooperative;Pleasant mood      Treatment Provided   Treatment provided Cognitive-Linquistic      Cognitive-Linquistic Treatment   Treatment focused on Dysarthria    Skilled Treatment SLP observed pt using abdominal breathing at rest once sitting in ST room and 50% in conversation in [place] room with average volume low-mid 70s dB. Average intensity /a/ was low-mid 90s dB and sentences (Everyday) was upper 80s dB. 15 minutes of conversation in [place] room and around clinic pt maintained loudness in low 70s dB, and with 100% intelligibility (respectively). In 13 minutes conversation outside with road noise/traffic noise, pt was 100% intelligible. Pt without notable throat clearing today and used wtaer instead.       Assessment / Recommendations / Plan   Plan Continue with current plan of care      Progression Toward Goals   Progression toward goals Progressing toward goals              SLP Short Term Goals - 12/19/19 1524      SLP SHORT TERM GOAL #1   Title pt will produce /a/ with average >90 dB at 30 cm over 3 sessions    Baseline 12-19-19    Time 2    Period Weeks    Status  On-going      SLP SHORT TERM GOAL #2   Title pt will demo abdominal breathing with sentences 90% accuracy x 3 sessions    Baseline 12-19-19    Time 2    Period Weeks    Status On-going      SLP SHORT TERM GOAL #3   Title pt will generate 10 minutes simple conversation with low 70s dB average in 3 sessions    Status Achieved      SLP SHORT TERM GOAL #4   Title pt will complete clinical swallowing assessment if indicated.    Status Deferred            SLP Long Term Goals - 12/19/19 1524      SLP LONG TERM GOAL #1   Title pt will use abdominal breathing >80% of the time during 10 minute conversation in 3 sessions    Time 6    Period Weeks    Status On-going      SLP LONG TERM GOAL #2   Title pt will maintain WNL vocal quality/intensity in 15 minute conversation  outside [place] room x 3 sessions    Time 6    Period Weeks    Status On-going      SLP LONG TERM GOAL #3   Title pt will report fewer requests to repeat himself than prior to ST    Status Achieved      SLP LONG TERM GOAL #4   Title pt will tell SLP 3 overt s/s difficulty oropharyngeal dysphagia with modified independence    Status Deferred      SLP LONG TERM GOAL #5   Title pt will tell SLP 3 compensations for anomia    Time 6    Period Weeks    Status On-going            Plan - 12/19/19 1523    Clinical Impression Statement Patient presents with 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 He reports increase in requests to repeat himself over the past year. Intelligibility was not affected for this listener in a quiet environment. No anomia/aphasia noted today. I recommend continued skilled ST to address communication and if necessary, dysphagia, in order to improve pt's communication and quality of life.    Speech Therapy Frequency 2x / week    Duration --   8 weeks or 17 visits   Treatment/Interventions [place] precaution training;Language facilitation;Environmental controls;Cueing hierarchy;SLP instruction and feedback;Compensatory techniques;Functional tasks;Compensatory strategies;Patient/family education;Diet toleration management by SLP;Other (comment)   MBS if warranted   Potential to Achieve Goals Good    Consulted and Agree with Plan of Care Patient           Patient will benefit from skilled therapeutic intervention in order to improve the following deficits and impairments:   Dysarthria and anarthria  Aphasia  Dysphagia, unspecified type    Problem List Patient Active Problem List   Diagnosis Date Noted  . S/P TKR (total knee replacement), right 05/05/2019  . Osteoarthritis of right knee 05/02/2019  . Prostate cancer ([place]) 03/22/2018  . Malignant neoplasm of  prostate ([place]) 02/27/2018    [facility] ,[place], [place]  12/19/2019, 3:25 PM  [facility] [address] [place], [place], [address] Phone: [phone]   Fax:  [phone]   Name: Matthew [name] MRN: [id number] Date of Birth: 1951-04-06

## 2019-12-19 NOTE — Patient Instructions (Signed)
Added PWR! Moves handout:    Forward<>back stepping and weightshifting without stopping in the middle Standing beside counter 10 reps each side Once per day

## 2019-12-19 NOTE — Therapy (Signed)
[facility] [address] [place], [place], [address] Phone: [phone]   Fax:  [phone]  Occupational Therapy Treatment  Patient Details  Name: Matthew [name] MRN: [id number] Date of Birth: Dec 01, 1950 Referring Provider (OT): Dr. Rexene Alberts   Encounter Date: 12/19/2019   OT End of Session - 12/19/19 1338    Visit Number 6    Number of Visits 25    Date for OT Re-Evaluation 03/02/20    Authorization Type [employer]    Authorization Time Period POC written for 12 weeks , pt goes on vacation after 4 weeks and may need to resume after pt returns- may d/c after 4-8 weeks though    Authorization - Visit Number 6    Authorization - Number of Visits 10    OT Start Time 1104    OT Stop Time 1145    OT Time Calculation (min) 41 min    Activity Tolerance Patient tolerated treatment well    Behavior During Therapy [facility] for tasks assessed/performed           Past Medical History:  Diagnosis Date  . Anxiety   . Arthritis   . GERD (gastroesophageal reflux disease)    occ remote history  . Hypertension   . Prostate cancer ([place])   . Right knee pain    questionable meniscal tear  . Seasonal allergies     Past Surgical History:  Procedure Laterality Date  . COLONOSCOPY    . Leg Laceration Repair  Left    Age 69  . PELVIC LYMPH NODE DISSECTION Bilateral 03/22/2018   Procedure: PELVIC LYMPH NODE DISSECTION;  Surgeon: Ceasar Mons, MD;  [facility];  Service: Urology;  Laterality: Bilateral;  . PROSTATE BIOPSY    . ROBOT ASSISTED LAPAROSCOPIC RADICAL PROSTATECTOMY N/A 03/22/2018   Procedure: XI ROBOTIC ASSISTED LAPAROSCOPIC PROSTATECTOMY;  Surgeon: Ceasar Mons, MD;  [facility];  Service: Urology;  Laterality: N/A;  . TOTAL KNEE ARTHROPLASTY Right 05/05/2019   Procedure: RIGHT TOTAL KNEE ARTHROPLASTY;  Surgeon: Frederik Pear, MD;  [facility];  Service: Orthopedics;  Laterality:  Right;    There were no vitals filed for this visit.   Subjective Assessment - 12/19/19 1341    Subjective  Pt denies pain    Currently in Pain? No/denies    Pain Onset More than a month ago           Treatment: arm bike x 5 mins level 1 for conditioning. Dynamic step and reach to right and left sides, min v.c for larger amplitude movements                     OT Education - 12/19/19 1230    Education Details donning/ doffing jacket and fastening buttons with big movements, modified quadraped PWR, basic 4 10 reps each, min v.c and demo    Person(s) Educated Patient    Methods Explanation;Demonstration;Verbal cues;Handout    Comprehension Verbalized understanding;Returned demonstration;Verbal cues required            OT Short Term Goals - 12/03/19 1343      OT SHORT TERM GOAL #1   Title I with PD specific HEP    Time 4    Period Weeks    Status New    Target Date 01/02/20      OT SHORT TERM GOAL #2   Title Pt will verbalize understanding of adapted strategies to maximize safety and I with ADLs/  IADLs .    Time 4    Period Weeks    Status New    Target Date 01/02/20      OT SHORT TERM GOAL #3   Title Pt will  demonstrate improved LUE functional use for ADLs as evidenced by increasing box/ blocks score by 4 blocks with RUE    Time 4    Period Weeks    Status New      OT SHORT TERM GOAL #4   Title Pt will demonstrate increased ease with dressing as evidenced by decreasing PPT#4(don/ doff jacket) to 25 secs or less    Baseline 29.78    Time 4    Period Weeks    Status New      OT SHORT TERM GOAL #5   Title Pt will retrieve a lightweight object from overhead shelf with -15 elbow extension in RUE    Time 4    Period Weeks    Status New             OT Long Term Goals - 12/03/19 1347      OT LONG TERM GOAL #1   Title Pt will verbalize understanding of ways to prevent future PD related complications and PD community resources.    Time 12     Period Weeks    Status New      OT LONG TERM GOAL #2   Title Pt will demonstrate improved fine motor coordination for ADLs as evidenced by decreasing 9 hole peg test score for RUE by 3 secs    Time 12    Period Weeks    Status New      OT LONG TERM GOAL #3   Title Pt will demonstrate improved ease with dressing as eveidenced by decreasing 3 button/ unbutton time to 30 secs or less    Baseline 34.68    Time 12    Period Weeks    Status New      OT LONG TERM GOAL #4   Title Pt will report increased ease with playing his guitar    Time 12    Period Weeks    Status New      OT LONG TERM GOAL #5   Title Pt will report pain in left shoulder no greater than 2/10 for ADLs.    Time 12    Period Weeks    Status New                 Plan - 12/19/19 1220    Clinical Impression Statement Pt is progressing towards goals. He demonstrates improving carryover of larger amplitude movement with functional activity.    OT Occupational Profile and History Detailed Assessment- Review of Records and additional review of physical, cognitive, psychosocial history related to current functional performance    Occupational performance deficits (Please refer to evaluation for details): ADL's;IADL's;Leisure;Social Participation    Body Structure / Function / Physical Skills ADL;Balance;Mobility;Strength;Tone;UE functional use;Flexibility;FMC;Coordination;Decreased knowledge of precautions;GMC;ROM;Decreased knowledge of use of DME;Dexterity;IADL    Rehab Potential Good    Clinical Decision Making Limited treatment options, no task modification necessary    Comorbidities Affecting Occupational Performance: May have comorbidities impacting occupational performance    Modification or Assistance to Complete Evaluation  No modification of tasks or assist necessary to complete eval    OT Frequency 2x / week   may d/c after 4-5 weeks dep on progress   OT Duration 12 weeks   plan written for 12 weeks  as pt  is going on vacation for a week, pt may need to resume following vactions   OT Treatment/Interventions Self-care/ADL training;Therapeutic exercise;Functional Mobility Training;Balance training;Manual Therapy;Neuromuscular education;Ultrasound;Aquatic Therapy;Energy conservation;Therapeutic activities;DME and/or AE instruction;Paraffin;Cryotherapy;Fluidtherapy;Gait Training;Moist Heat;Contrast Bath;Passive range of motion;Patient/family education    Plan big movements with functional activity.strategies for ADLS,    Recommended Other Services Pt is only scheduled for 4 weeks as he goes on vacation after 4 weeks    Consulted and Agree with Plan of Care Patient           Patient will benefit from skilled therapeutic intervention in order to improve the following deficits and impairments:   Body Structure / Function / Physical Skills: ADL, Balance, Mobility, Strength, Tone, UE functional use, Flexibility, FMC, Coordination, Decreased knowledge of precautions, GMC, ROM, Decreased knowledge of use of DME, Dexterity, IADL       Visit Diagnosis: Other lack of coordination  Other symptoms and signs involving the nervous system  Abnormal posture  Unsteadiness on feet  Other abnormalities of gait and mobility    Problem List Patient Active Problem List   Diagnosis Date Noted  . S/P TKR (total knee replacement), right 05/05/2019  . Osteoarthritis of right knee 05/02/2019  . Prostate cancer ([place]) 03/22/2018  . Malignant neoplasm of prostate ([place]) 02/27/2018    Aisia Correira 12/19/2019, 1:41 PM  [facility] [address] [place], [place], [address] Phone: [phone]   Fax:  [phone]  Name: Matthew [name] MRN: [id number] Date of Birth: 1951/01/20

## 2019-12-23 ENCOUNTER — Ambulatory Visit: Payer: PPO | Admitting: Occupational Therapy

## 2019-12-23 ENCOUNTER — Encounter: Payer: Self-pay | Admitting: Occupational Therapy

## 2019-12-23 ENCOUNTER — Ambulatory Visit: Payer: PPO

## 2019-12-23 ENCOUNTER — Ambulatory Visit: Payer: PPO | Attending: Neurology | Admitting: Physical Therapy

## 2019-12-23 ENCOUNTER — Other Ambulatory Visit: Payer: Self-pay

## 2019-12-23 DIAGNOSIS — R2681 Unsteadiness on feet: Secondary | ICD-10-CM

## 2019-12-23 DIAGNOSIS — R4701 Aphasia: Secondary | ICD-10-CM

## 2019-12-23 DIAGNOSIS — R1310 Dysphagia, unspecified: Secondary | ICD-10-CM | POA: Insufficient documentation

## 2019-12-23 DIAGNOSIS — R2689 Other abnormalities of gait and mobility: Secondary | ICD-10-CM | POA: Insufficient documentation

## 2019-12-23 DIAGNOSIS — R471 Dysarthria and anarthria: Secondary | ICD-10-CM | POA: Diagnosis not present

## 2019-12-23 DIAGNOSIS — R29818 Other symptoms and signs involving the nervous system: Secondary | ICD-10-CM

## 2019-12-23 DIAGNOSIS — R293 Abnormal posture: Secondary | ICD-10-CM | POA: Diagnosis not present

## 2019-12-23 DIAGNOSIS — M6281 Muscle weakness (generalized): Secondary | ICD-10-CM | POA: Insufficient documentation

## 2019-12-23 DIAGNOSIS — R278 Other lack of coordination: Secondary | ICD-10-CM | POA: Diagnosis not present

## 2019-12-23 NOTE — Patient Instructions (Signed)
   Ways to compensate for word finding problems:  1) Describe it (works for a person, item, or place)  2) choose a synonym  3) stop and rephrase, avoiding the word you can't think of  =================================  4) gesture  5) draw

## 2019-12-23 NOTE — Therapy (Signed)
[facility] [address] [place], [place], [address] Phone: [phone]   Fax:  [phone]  Speech Language Pathology Treatment  Patient Details  Name: Matthew [name] MRN: [id number] Date of Birth: 09/23/1950 Referring Provider (SLP): Dr. Rexene Alberts   Encounter Date: 12/23/2019   End of Session - 12/23/19 1410    Visit Number 7    Number of Visits 17    Date for SLP Re-Evaluation 01/31/20    SLP Start Time 8366    SLP Stop Time  1400    SLP Time Calculation (min) 43 min    Activity Tolerance Patient tolerated treatment well           Past Medical History:  Diagnosis Date  . Anxiety   . Arthritis   . GERD (gastroesophageal reflux disease)    occ remote history  . Hypertension   . Prostate cancer ([place])   . Right knee pain    questionable meniscal tear  . Seasonal allergies     Past Surgical History:  Procedure Laterality Date  . COLONOSCOPY    . Leg Laceration Repair  Left    Age 69  . PELVIC LYMPH NODE DISSECTION Bilateral 03/22/2018   Procedure: PELVIC LYMPH NODE DISSECTION;  Surgeon: Ceasar Mons, MD;  Location: [facility];  Service: Urology;  Laterality: Bilateral;  . PROSTATE BIOPSY    . ROBOT ASSISTED LAPAROSCOPIC RADICAL PROSTATECTOMY N/A 03/22/2018   Procedure: XI ROBOTIC ASSISTED LAPAROSCOPIC PROSTATECTOMY;  Surgeon: Ceasar Mons, MD;  Location: [facility];  Service: Urology;  Laterality: N/A;  . TOTAL KNEE ARTHROPLASTY Right 05/05/2019   Procedure: RIGHT TOTAL KNEE ARTHROPLASTY;  Surgeon: Frederik Pear, MD;  Location: [facility];  Service: Orthopedics;  Laterality: Right;    There were no vitals filed for this visit.   Subjective Assessment - 12/23/19 1331    Subjective Pt cannot recall if he was asked to repeat himself, since last session. ("I really don't think I was.")    Currently in Pain? No/denies                 ADULT SLP TREATMENT - 12/23/19 1331      General  Information   Behavior/Cognition Alert;Cooperative;Pleasant mood      Treatment Provided   Treatment provided Cognitive-Linquistic;Dysphagia      Cognitive-Linquistic Treatment   Treatment focused on Dysarthria    Skilled Treatment SLP, observing pt, saw abdominal breathing at rest once sitting in [place] room. Average intensity /a/ was mid-upper 80s dB and sentences (Everyday) was mid-upper 80s dB. 18 minutes of conversation in [place] room and around clinic pt maintained loudness to maintain 100% intelligibliity. SLP educated pt for anomia compensations.      Assessment / Recommendations / Plan   Plan Continue with current plan of care      Progression Toward Goals   Progression toward goals Progressing toward goals            SLP Education - 12/23/19 1410    Education Details abdominal breathing, anoima compensations    Person(s) Educated Patient    Methods Explanation;Demonstration;Verbal cues    Comprehension Verbalized understanding;Returned demonstration;Verbal cues required;Need further instruction            SLP Short Term Goals - 12/23/19 1353      SLP SHORT TERM GOAL #1   Title pt will produce /a/ with average >90 dB at 30 cm over 3 sessions    Baseline 12-19-19    Time 1  Period Weeks    Status On-going      SLP SHORT TERM GOAL #2   Title pt will demo abdominal breathing with sentences 90% accuracy x 3 sessions    Baseline 12-19-19    Time 1    Period Weeks    Status On-going      SLP SHORT TERM GOAL #3   Title pt will generate 10 minutes simple conversation with low 70s dB average in 3 sessions    Status Achieved      SLP SHORT TERM GOAL #4   Title pt will complete clinical swallowing assessment if indicated.    Status Deferred            SLP Long Term Goals - 12/23/19 1424      SLP LONG TERM GOAL #1   Title pt will use abdominal breathing >80% of the time during 10 minute conversation in 3 sessions    Time 5    Period Weeks    Status On-going       SLP LONG TERM GOAL #2   Title pt will maintain WNL vocal quality/intensity in 15 minute conversation outside [place] room x 3 sessions    Time 5    Period Weeks    Status On-going      SLP LONG TERM GOAL #3   Title pt will report fewer requests to repeat himself than prior to ST    Status Achieved      SLP LONG TERM GOAL #4   Title pt will tell SLP 3 overt s/s difficulty oropharyngeal dysphagia with modified independence    Status Deferred      SLP LONG TERM GOAL #5   Title pt will tell SLP 3 compensations for anomia    Time --    Period --    Status Achieved            Plan - 12/23/19 1411    Clinical Impression Statement Patient presents with 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 He reports increase in requests to repeat himself over the past year. Intelligibility was not affected for this listener in a quiet environment. No anomia/aphasia noted today. I recommend continued skilled ST to address communication and if necessary, dysphagia, in order to improve pt's communication and quality of life.    Speech Therapy Frequency 2x / week    Duration --   8 weeks or 17 visits   Treatment/Interventions Aspiration precaution training;Language facilitation;Environmental controls;Cueing hierarchy;SLP instruction and feedback;Compensatory techniques;Functional tasks;Compensatory strategies;Patient/family education;Diet toleration management by SLP;Other (comment)   MBS if warranted   Potential to Achieve Goals Good    Consulted and Agree with Plan of Care Patient           Patient will benefit from skilled therapeutic intervention in order to improve the following deficits and impairments:   Dysarthria and anarthria  Aphasia  Dysphagia, unspecified type    Problem List Patient Active Problem List   Diagnosis Date Noted  . S/P TKR (total knee replacement), right 05/05/2019  .  Osteoarthritis of right knee 05/02/2019  . Prostate cancer ([place]) 03/22/2018  . Malignant neoplasm of prostate ([place]) 02/27/2018    [facility] ,[place] [place]  12/23/2019, 2:24 PM  [facility] [address] [place], [place], [address] Phone: [phone]   Fax:  [phone]   Name: Matthew [name] MRN: [id number] Date of Birth: 18-Aug-1950

## 2019-12-23 NOTE — Therapy (Signed)
[facility] [address] [place], [place], [address] Phone: [phone]   Fax:  [phone]  Occupational Therapy Treatment  Patient Details  Name: Matthew [name] MRN: [id number] Date of Birth: Jan 20, 1951 Referring Provider (OT): Dr. Rexene Alberts   Encounter Date: 12/23/2019   OT End of Session - 12/23/19 1440    Visit Number 7    Number of Visits 25    Date for OT Re-Evaluation 03/02/20    Authorization Type [employer]    Authorization Time Period POC written for 12 weeks , pt goes on vacation after 4 weeks and may need to resume after pt returns- may d/c after 4-8 weeks though    Authorization - Visit Number 7    Authorization - Number of Visits 10    OT Start Time 1405    OT Stop Time 1445    OT Time Calculation (min) 40 min    Activity Tolerance Patient tolerated treatment well    Behavior During Therapy [facility] for tasks assessed/performed           Past Medical History:  Diagnosis Date  . Anxiety   . Arthritis   . GERD (gastroesophageal reflux disease)    occ remote history  . Hypertension   . Prostate cancer ([place])   . Right knee pain    questionable meniscal tear  . Seasonal allergies     Past Surgical History:  Procedure Laterality Date  . COLONOSCOPY    . Leg Laceration Repair  Left    Age 69  . PELVIC LYMPH NODE DISSECTION Bilateral 03/22/2018   Procedure: PELVIC LYMPH NODE DISSECTION;  Surgeon: Ceasar Mons, MD;  [facility];  Service: Urology;  Laterality: Bilateral;  . PROSTATE BIOPSY    . ROBOT ASSISTED LAPAROSCOPIC RADICAL PROSTATECTOMY N/A 03/22/2018   Procedure: XI ROBOTIC ASSISTED LAPAROSCOPIC PROSTATECTOMY;  Surgeon: Ceasar Mons, MD;  [facility];  Service: Urology;  Laterality: N/A;  . TOTAL KNEE ARTHROPLASTY Right 05/05/2019   Procedure: RIGHT TOTAL KNEE ARTHROPLASTY;  Surgeon: Frederik Pear, MD;  [facility];  Service: Orthopedics;  Laterality: Right;     There were no vitals filed for this visit.   Subjective Assessment - 12/23/19 1444    Subjective  Pt denies pain    Currently in Pain? No/denies    Pain Onset More than a month ago             Treatment: Reviewed donning doffing jacket with big movements, pt returned demonstration x 3. Therapist checked progress towards goals. Pt met short term goals. Pt will be gone on vacation next week. Arm bike x 5 mins level 1 for conditioning Digi flex yellow and red to simulate playing the guitar for isolated finger flexion, min v.c for right and left UE's Grooved pegboard for increased fine motor coordination with RUE then removing with in hand manipulation, min v.c Rotating dice and block in left and right hands to generate a specific total for fine motor coordination with a cognitive component  Seated PWR! Up, rock and twist x 10 reps each                   OT Short Term Goals - 12/23/19 1408      OT SHORT TERM GOAL #1   Title I with PD specific HEP    Time 4    Period Weeks    Status Achieved    Target Date 01/02/20  OT SHORT TERM GOAL #2   Title Pt will verbalize understanding of adapted strategies to maximize safety and I with ADLs/ IADLs .    Time 4    Period Weeks    Status Achieved    Target Date 01/02/20      OT SHORT TERM GOAL #3   Title Pt will  demonstrate improved LUE functional use for ADLs as evidenced by increasing box/ blocks score by 4 blocks with RUE    Baseline 41    Time 4    Period Weeks    Status Achieved   45     OT SHORT TERM GOAL #4   Title Pt will demonstrate increased ease with dressing as evidenced by decreasing PPT#4(don/ doff jacket) to 25 secs or less    Baseline 29.78    Time 4    Period Weeks    Status Achieved   10.60 secs     OT SHORT TERM GOAL #5   Title Pt will retrieve a lightweight object from overhead shelf with -15 elbow extension in RUE    Time 4    Period Weeks    Status Achieved   -5 elbow ext              OT Long Term Goals - 12/23/19 1427      OT LONG TERM GOAL #1   Title Pt will verbalize understanding of ways to prevent future PD related complications and PD community resources.    Time 12    Period Weeks    Status On-going      OT LONG TERM GOAL #2   Title Pt will demonstrate improved fine motor coordination for ADLs as evidenced by decreasing 9 hole peg test score for RUE by 3 secs    Time 12    Period Weeks    Status On-going      OT LONG TERM GOAL #3   Title Pt will demonstrate improved ease with dressing as eveidenced by decreasing 3 button/ unbutton time to 30 secs or less    Baseline 34.68    Time 12    Period Weeks    Status On-going      OT LONG TERM GOAL #4   Title Pt will report increased ease with playing his guitar    Time 12    Period Weeks    Status On-going      OT LONG TERM GOAL #5   Title Pt will report pain in left shoulder no greater than 2/10 for ADLs.    Time 12    Period Weeks    Status On-going                  Patient will benefit from skilled therapeutic intervention in order to improve the following deficits and impairments:           Visit Diagnosis: Other lack of coordination  Other symptoms and signs involving the nervous system  Abnormal posture  Other abnormalities of gait and mobility  Unsteadiness on feet    Problem List Patient Active Problem List   Diagnosis Date Noted  . S/P TKR (total knee replacement), right 05/05/2019  . Osteoarthritis of right knee 05/02/2019  . Prostate cancer ([place]) 03/22/2018  . Malignant neoplasm of prostate ([place]) 02/27/2018    Anisten Tomassi 12/23/2019, 2:45 PM  [facility] [address] [place], [place], [address] Phone: [phone]   Fax:  [phone]  Name: Matthew [name]  MRN: [id number] Date of Birth: Nov 01, 1950

## 2019-12-23 NOTE — Therapy (Signed)
[facility] [address] [place], [place], [address] Phone: [phone]   Fax:  [phone]  Physical Therapy Treatment  Patient Details  Name: Matthew [name] MRN: [id number] Date of Birth: 17-Aug-1950 Referring Provider (PT): Star Age   Encounter Date: 12/23/2019   PT End of Session - 12/23/19 1529    Visit Number 8    Number of Visits 17    Date for PT Re-Evaluation 10/62/69   90 day cert due to delay in start of scheduling, 8-wk POC   Authorization Type [employer]    Progress Note Due on Visit 10    PT Start Time 1446    PT Stop Time 1528    PT Time Calculation (min) 42 min    Activity Tolerance Patient tolerated treatment well    Behavior During Therapy [facility] for tasks assessed/performed           Past Medical History:  Diagnosis Date  . Anxiety   . Arthritis   . GERD (gastroesophageal reflux disease)    occ remote history  . Hypertension   . Prostate cancer ([place])   . Right knee pain    questionable meniscal tear  . Seasonal allergies     Past Surgical History:  Procedure Laterality Date  . COLONOSCOPY    . Leg Laceration Repair  Left    Age 69  . PELVIC LYMPH NODE DISSECTION Bilateral 03/22/2018   Procedure: PELVIC LYMPH NODE DISSECTION;  Surgeon: Ceasar Mons, MD;  [facility];  Service: Urology;  Laterality: Bilateral;  . PROSTATE BIOPSY    . ROBOT ASSISTED LAPAROSCOPIC RADICAL PROSTATECTOMY N/A 03/22/2018   Procedure: XI ROBOTIC ASSISTED LAPAROSCOPIC PROSTATECTOMY;  Surgeon: Ceasar Mons, MD;  [facility];  Service: Urology;  Laterality: N/A;  . TOTAL KNEE ARTHROPLASTY Right 05/05/2019   Procedure: RIGHT TOTAL KNEE ARTHROPLASTY;  Surgeon: Frederik Pear, MD;  [facility];  Service: Orthopedics;  Laterality: Right;    There were no vitals filed for this visit.   Subjective Assessment - 12/23/19 1448    Subjective No changes since he was last  here. No falls. Leaves for the beach this Saturday.    Patient is accompained by: --   wife   Pertinent History hx of prostate cancer/removal, incontinence, hx of R TKR 04/2019    Patient Stated Goals Pt's goal for therapy is to improve walking, to have strength and be normal again.  Wants to get back to playing guitar.    Currently in Pain? No/denies    Pain Onset More than a month ago                 NMR:    Reviewed most recent addition to HEP given at last session: Forward<>back stepping and weightshifting without stopping in the middle Standing beside counter - x10 reps B, initial cues for weight shifting      Pt performs PWR! Moves in standing position x 10 reps - reviewed as HEP    PWR! Up for improved posture x10 reps  -additional 10 reps standing on blue foam beam  PWR! [name] for improved weighshifting x10 reps  -x10 reps standing on blue foam beam -x10 reps on level ground with work on lifting contralateral leg off the ground for dynamic SLS   PWR! Twist for improved trunk rotation x10 reps - cues at times to reset in the middle  PWR! Step for improved step initiation x10 reps B - cues for  incr foot clearance   Initial visual cues (pt referring to picture from his binder)   -alternating stepping over 4" beam for incr foot clearance and SLS x10 reps B, cues for heel strike initially when stepping over -standing on blue foam: alternating forward toe taps to cones for SLS and incr foot clearance x10 reps B, beginning with single UE support and progressing to none  -standing on blue foam beam: alternating posterior stepping strategy x10 reps with no UE support     [facility] Adult PT Treatment/Exercise - 12/23/19 1451      Transfers   Transfers Sit to Stand;Stand to Sit    Sit to Stand 5: Supervision;Without upper extremity assist;From chair/3-in-1    Stand to Sit 5: Supervision;Without upper extremity assist;To chair/3-in-1    Comments 10 sit <> stands with no UE  support without any posterior lean or LOB.      Therapeutic Activites    Therapeutic Activities Other Therapeutic Activities    Other Therapeutic Activities discussed fall prevention with pt and provided handout, pt reporting that he has quite a few throw rugs at home that he will look into making sure that they can be taken up or more securely attached to the floor      Neuro Re-ed    Neuro Re-ed Details  posterior push and release test: 4 reps - 3 reps with single step and 1 rep with 3 steps to maintain balance.                  PT Education - 12/23/19 1528    Education Details progress towards goals, reviewed stepping additions to HEP made at last session, fall prevention education    Person(s) Educated Patient    Methods Explanation;Demonstration    Comprehension Verbalized understanding;Returned demonstration            PT Short Term Goals - 12/23/19 1450      PT SHORT TERM GOAL #1   Title Pt will be indepedent with Parkinson's specific HEP to address balance, transfers, gait for improved overall mobility.  TARGET 12/26/2019 (delayed date due to delayed start-Pt wants OT, PT, speech together)    Baseline met on 12/23/19    Time 4    Period Weeks    Status Achieved    Target Date 12/26/19      PT SHORT TERM GOAL #2   Title Pt will perform at least 8 of 10 reps of sit<>stand transfers with no posterior lean or LOB.    Baseline 10 sit <> stands with no UE support without any posterior lean or LOB.    Time 4    Period Weeks    Status Achieved    Target Date 12/26/19      PT SHORT TERM GOAL #3   Title Pt will improve posterior push and release test to recover in less than 2 steps, for improved balance recovery.    Baseline 4 reps - 3 reps with single step and 1 rep with 3 steps to maintain balance.    Time 4    Period Weeks    Status Achieved    Target Date 12/26/19      PT SHORT TERM GOAL #4   Title Pt will verbalize understanding of fall prevention education in  home environment.    Baseline reviewed with pt on 12/23/19 and provided handout    Time 4    Period Weeks    Status Partially Met    Target Date  12/26/19            PT Long Term Goals - 10/30/19 1659      PT LONG TERM GOAL #1   Title Pt will be independent with progression of HEP for Parkinson's related deficits.  TARGET 01/23/2020 (delayed due to delayed start for coordination of schedules)    Time 8    Period Weeks    Status New    Target Date 01/23/20      PT LONG TERM GOAL #2   Title Pt will improve gait velocity to at least 2.8 ft/sec for improved gait efficiency and safety.    Baseline 2.5 ft/sec    Time 8    Period Weeks    Status New    Target Date 01/23/20      PT LONG TERM GOAL #3   Title Pt will improve MiniBESTest score to at least 23/28 for decreased fall risk.    Time 8    Period Weeks    Status New    Target Date 12/26/19      PT LONG TERM GOAL #4   Title Pt will improve TUG/TUG cognitive score to less than 10% difference for improved ability with dual tasking with gait.    Time 8    Period Weeks    Status New    Target Date 01/23/20      PT LONG TERM GOAL #5   Title Pt/wife will verbalize understanding of local Parkinson's disease-related resources, including ongoing community fitness.    Time 8    Period Weeks    Status New    Target Date 01/23/20                 Plan - 12/23/19 2215    Clinical Impression Statement Focus of today's skilled session was assessing pt's STGs. Pt has met 3 out of 4 STGs. Pt partially met STG #4 - reviewed fall prevention education today and provided handout. Pt with improvement in sit <> stands and demonstrating no posterior lean today. Pt also able to perform posterior push and release test and take 1 step to maintain balance, demonstrating improvements in posterior stepping strategy. Remainder of session focused on having pt demo his HEP and work on balance strategies with focus on compliant surfaces, SLS, and  foot clearance. Will continue to progress towards LTGs.    Personal Factors and Comorbidities Comorbidity 3+    Comorbidities anxiety, arthritis, GERD, prostate cancer, HTN, s/p R TKR 04/2019    Examination-Activity Limitations Locomotion Level;Transfers;Stand;Stairs    Examination-Participation Restrictions Community Activity;Other   playing with grandchildren   Stability/Clinical Decision Making Evolving/Moderate complexity    Rehab Potential Good    PT Frequency 2x / week    PT Duration 8 weeks   plus eval   PT Treatment/Interventions ADLs/Self Care Home Management;Gait training;Functional mobility training;Therapeutic activities;Therapeutic exercise;Balance training;Neuromuscular re-education;DME Instruction;Patient/family education    PT Next Visit Plan exercises to encourage terminal knee extension in stance; gait training, SLS, foot clearance.    PT Home Exercise Plan standing PWR moves    Consulted and Agree with Plan of Care Patient           Patient will benefit from skilled therapeutic intervention in order to improve the following deficits and impairments:  Abnormal gait, Difficulty walking, Decreased balance, Decreased mobility, Postural dysfunction, Decreased strength, Impaired flexibility  Visit Diagnosis: Other lack of coordination  Other symptoms and signs involving the nervous system  Abnormal posture  Unsteadiness on feet  Problem List Patient Active Problem List   Diagnosis Date Noted  . S/P TKR (total knee replacement), right 05/05/2019  . Osteoarthritis of right knee 05/02/2019  . Prostate cancer ([place]) 03/22/2018  . Malignant neoplasm of prostate ([place]) 02/27/2018    Arliss Journey, PT, DPT  12/23/2019, 10:15 PM  [facility] [address] [place], [place], [address] Phone: [phone]   Fax:  [phone]  Name: Matthew [name] MRN: [id number] Date of Birth: October 24, 1950

## 2019-12-23 NOTE — Patient Instructions (Signed)

## 2019-12-26 ENCOUNTER — Ambulatory Visit: Payer: PPO | Admitting: Occupational Therapy

## 2019-12-26 ENCOUNTER — Ambulatory Visit: Payer: PPO

## 2019-12-26 ENCOUNTER — Other Ambulatory Visit: Payer: Self-pay

## 2019-12-26 ENCOUNTER — Encounter: Payer: Self-pay | Admitting: Occupational Therapy

## 2019-12-26 DIAGNOSIS — R29818 Other symptoms and signs involving the nervous system: Secondary | ICD-10-CM

## 2019-12-26 DIAGNOSIS — R278 Other lack of coordination: Secondary | ICD-10-CM | POA: Diagnosis not present

## 2019-12-26 DIAGNOSIS — R293 Abnormal posture: Secondary | ICD-10-CM

## 2019-12-26 DIAGNOSIS — R1310 Dysphagia, unspecified: Secondary | ICD-10-CM

## 2019-12-26 DIAGNOSIS — M6281 Muscle weakness (generalized): Secondary | ICD-10-CM

## 2019-12-26 DIAGNOSIS — R2689 Other abnormalities of gait and mobility: Secondary | ICD-10-CM

## 2019-12-26 DIAGNOSIS — R2681 Unsteadiness on feet: Secondary | ICD-10-CM

## 2019-12-26 DIAGNOSIS — R4701 Aphasia: Secondary | ICD-10-CM

## 2019-12-26 DIAGNOSIS — R471 Dysarthria and anarthria: Secondary | ICD-10-CM

## 2019-12-26 NOTE — Therapy (Signed)
[facility] [address] [place], [place], [address] Phone: [phone]   Fax:  [phone]  Occupational Therapy Treatment  Patient Details  Name: Matthew [name] MRN: [id number] Date of Birth: June 15, 1950 Referring Provider (OT): Dr. Rexene Alberts   Encounter Date: 12/26/2019   OT End of Session - 12/26/19 1239    Visit Number 8    Number of Visits 25    Date for OT Re-Evaluation 03/02/20    Authorization Type [employer]    Authorization Time Period POC written for 12 weeks , pt goes on vacation after 4 weeks and may need to resume after pt returns- may d/c after 4-8 weeks though    Authorization - Visit Number 8    Authorization - Number of Visits 10    OT Start Time 1237    OT Stop Time 1315    OT Time Calculation (min) 38 min    Activity Tolerance Patient tolerated treatment well    Behavior During Therapy [facility] for tasks assessed/performed           Past Medical History:  Diagnosis Date  . Anxiety   . Arthritis   . GERD (gastroesophageal reflux disease)    occ remote history  . Hypertension   . Prostate cancer ([place])   . Right knee pain    questionable meniscal tear  . Seasonal allergies     Past Surgical History:  Procedure Laterality Date  . COLONOSCOPY    . Leg Laceration Repair  Left    Age 54  . PELVIC LYMPH NODE DISSECTION Bilateral 03/22/2018   Procedure: PELVIC LYMPH NODE DISSECTION;  Surgeon: Ceasar Mons, MD;  Location: [facility];  Service: Urology;  Laterality: Bilateral;  . PROSTATE BIOPSY    . ROBOT ASSISTED LAPAROSCOPIC RADICAL PROSTATECTOMY N/A 03/22/2018   Procedure: XI ROBOTIC ASSISTED LAPAROSCOPIC PROSTATECTOMY;  Surgeon: Ceasar Mons, MD;  Location: [facility];  Service: Urology;  Laterality: N/A;  . TOTAL KNEE ARTHROPLASTY Right 05/05/2019   Procedure: RIGHT TOTAL KNEE ARTHROPLASTY;  Surgeon: Frederik Pear, MD;  Location: [facility];  Service: Orthopedics;  Laterality: Right;     There were no vitals filed for this visit.   Subjective Assessment - 12/26/19 1238    Currently in Pain? No/denies                 Treatment:PWR! Basic 4 modified quadraped, 10 reps each Functional step and reach to toss beanbags to target  min v.c for big step and to keep feet apart and well as big release. Purdue pegboards for increased RUE fine motor coordination min v.c for big movements Digiflex yellow for composite grip then individual resisted flexion in prep for playing guitar Crumpling plastic bag with left and right hands with big movements for finger deterity in prep for playing guitar.                  OT Short Term Goals - 12/23/19 1408      OT SHORT TERM GOAL #1   Title I with PD specific HEP    Time 4    Period Weeks    Status Achieved    Target Date 01/02/20      OT SHORT TERM GOAL #2   Title Pt will verbalize understanding of adapted strategies to maximize safety and I with ADLs/ IADLs .    Time 4    Period Weeks    Status Achieved    Target Date 01/02/20  OT SHORT TERM GOAL #3   Title Pt will  demonstrate improved LUE functional use for ADLs as evidenced by increasing box/ blocks score by 4 blocks with RUE    Baseline 41    Time 4    Period Weeks    Status Achieved   45     OT SHORT TERM GOAL #4   Title Pt will demonstrate increased ease with dressing as evidenced by decreasing PPT#4(don/ doff jacket) to 25 secs or less    Baseline 29.78    Time 4    Period Weeks    Status Achieved   10.60 secs     OT SHORT TERM GOAL #5   Title Pt will retrieve a lightweight object from overhead shelf with -15 elbow extension in RUE    Time 4    Period Weeks    Status Achieved   -5 elbow ext            OT Long Term Goals - 12/23/19 1427      OT LONG TERM GOAL #1   Title Pt will verbalize understanding of ways to prevent future PD related complications and PD community resources.    Time 12    Period Weeks    Status On-going       OT LONG TERM GOAL #2   Title Pt will demonstrate improved fine motor coordination for ADLs as evidenced by decreasing 9 hole peg test score for RUE by 3 secs    Time 12    Period Weeks    Status On-going      OT LONG TERM GOAL #3   Title Pt will demonstrate improved ease with dressing as eveidenced by decreasing 3 button/ unbutton time to 30 secs or less    Baseline 34.68    Time 12    Period Weeks    Status On-going      OT LONG TERM GOAL #4   Title Pt will report increased ease with playing his guitar    Time 12    Period Weeks    Status On-going      OT LONG TERM GOAL #5   Title Pt will report pain in left shoulder no greater than 2/10 for ADLs.    Time 12    Period Weeks    Status On-going                 Plan - 12/26/19 1238    Clinical Impression Statement Pt is progressing towards goals. He demonstrates improving carryover of larger amplitude movement with functional activity.    OT Occupational Profile and History Detailed Assessment- Review of Records and additional review of physical, cognitive, psychosocial history related to current functional performance    Occupational performance deficits (Please refer to evaluation for details): ADL's;IADL's;Leisure;Social Participation    Body Structure / Function / Physical Skills ADL;Balance;Mobility;Strength;Tone;UE functional use;Flexibility;FMC;Coordination;Decreased knowledge of precautions;GMC;ROM;Decreased knowledge of use of DME;Dexterity;IADL    Rehab Potential Good    Clinical Decision Making Limited treatment options, no task modification necessary    Comorbidities Affecting Occupational Performance: May have comorbidities impacting occupational performance    Modification or Assistance to Complete Evaluation  No modification of tasks or assist necessary to complete eval    OT Frequency 2x / week   may d/c after 4-5 weeks dep on progress   OT Duration 12 weeks   plan written for 12 weeks as pt is going on  vacation for a week, pt may need to  resume following vactions   OT Treatment/Interventions Self-care/ADL training;Therapeutic exercise;Functional Mobility Training;Balance training;Manual Therapy;Neuromuscular education;Ultrasound;Aquatic Therapy;Energy conservation;Therapeutic activities;DME and/or AE instruction;Paraffin;Cryotherapy;Fluidtherapy;Gait Training;Moist Heat;Contrast Bath;Passive range of motion;Patient/family education    Plan big movements with functional activity.strategies for ADLS,    Recommended Other Services Pt is only scheduled for 4 weeks as he goes on vacation after 4 weeks    Consulted and Agree with Plan of Care Patient           Patient will benefit from skilled therapeutic intervention in order to improve the following deficits and impairments:   Body Structure / Function / Physical Skills: ADL, Balance, Mobility, Strength, Tone, UE functional use, Flexibility, FMC, Coordination, Decreased knowledge of precautions, GMC, ROM, Decreased knowledge of use of DME, Dexterity, IADL       Visit Diagnosis: Other lack of coordination  Other symptoms and signs involving the nervous system  Abnormal posture  Muscle weakness (generalized)  Other abnormalities of gait and mobility    Problem List Patient Active Problem List   Diagnosis Date Noted  . S/P TKR (total knee replacement), right 05/05/2019  . Osteoarthritis of right knee 05/02/2019  . Prostate cancer ([place]) 03/22/2018  . Malignant neoplasm of prostate ([place]) 02/27/2018    Matthew [name] 12/26/2019, 12:42 PM  [facility] [address] [place], [place], [address] Phone: [phone]   Fax:  [phone]  Name: Matthew [name] MRN: [id number] Date of Birth: [birth year]

## 2019-12-26 NOTE — Therapy (Signed)
[facility] [address] [place], [place], [address] Phone: [phone]   Fax:  [phone]  Speech Language Pathology Treatment.discharge summary  Patient Details  Name: Matthew [name] MRN: [id number] Date of Birth: [birth year] Referring Provider (SLP): Dr. Rexene Alberts   Encounter Date: 12/26/2019   End of Session - 12/26/19 1350    Visit Number 8    Number of Visits 17    Date for SLP Re-Evaluation 01/31/20    SLP Stop Time  1318    Activity Tolerance Patient tolerated treatment well           Past Medical History:  Diagnosis Date  . Anxiety   . Arthritis   . GERD (gastroesophageal reflux disease)    occ remote history  . Hypertension   . Prostate cancer ([place])   . Right knee pain    questionable meniscal tear  . Seasonal allergies     Past Surgical History:  Procedure Laterality Date  . COLONOSCOPY    . Leg Laceration Repair  Left    Age 69  . PELVIC LYMPH NODE DISSECTION Bilateral 03/22/2018   Procedure: PELVIC LYMPH NODE DISSECTION;  Surgeon: Ceasar Mons, MD;  [facility];  Service: Urology;  Laterality: Bilateral;  . PROSTATE BIOPSY    . ROBOT ASSISTED LAPAROSCOPIC RADICAL PROSTATECTOMY N/A 03/22/2018   Procedure: XI ROBOTIC ASSISTED LAPAROSCOPIC PROSTATECTOMY;  Surgeon: Ceasar Mons, MD;  [facility];  Service: Urology;  Laterality: N/A;  . TOTAL KNEE ARTHROPLASTY Right 05/05/2019   Procedure: RIGHT TOTAL KNEE ARTHROPLASTY;  Surgeon: Frederik Pear, MD;  [facility];  Service: Orthopedics;  Laterality: Right;    There were no vitals filed for this visit.          ADULT SLP TREATMENT - 12/26/19 1320      General Information   Behavior/Cognition Alert;Cooperative;Pleasant mood      Treatment Provided   Treatment provided Cognitive-Linquistic      Cognitive-Linquistic Treatment   Treatment focused on Dysarthria    Skilled Treatment SLP had pt generate 5  loud /a/ with average intensity upper 80s dB. Everyday sentences were produced with mid-upper 80s dB. 25 minutes of conversation outdoors pt maintained loudness of low 70s dB.       Assessment / Recommendations / Plan   Plan Continue with current plan of care            SLP Education - 12/26/19 1349    Education Details dysphagia    Person(s) Educated Patient    Methods Explanation    Comprehension Verbalized understanding            SLP Short Term Goals - 12/23/19 1353      SLP SHORT TERM GOAL #1   Title pt will produce /a/ with average >90 dB at 30 cm over 3 sessions    Baseline 12-19-19    Time 1    Period Weeks    Status On-going      SLP SHORT TERM GOAL #2   Title pt will demo abdominal breathing with sentences 90% accuracy x 3 sessions    Baseline 12-19-19    Time 1    Period Weeks    Status On-going      SLP SHORT TERM GOAL #3   Title pt will generate 10 minutes simple conversation with low 70s dB average in 3 sessions    Status Achieved      SLP SHORT TERM GOAL #4  Title pt will complete clinical swallowing assessment if indicated.    Status Deferred            SLP Long Term Goals - 12/26/19 1344      SLP LONG TERM GOAL #1   Title pt will use abdominal breathing >80% of the time during 10 minute conversation in 3 sessions    Baseline 12-23-19, 12-26-19    Status Partially Met      SLP LONG TERM GOAL #2   Title pt will maintain WNL vocal quality/intensity in 15 minute conversation outside [place] room x 3 sessions    Baseline 12-19-19, 12-23-19, 12-26-19    Period Weeks    Status On-going      SLP LONG TERM GOAL #3   Title pt will report fewer requests to repeat himself than prior to [place] #4   Title pt will tell SLP 3 overt s/s difficulty oropharyngeal dysphagia with modified independence    Status Deferred      SLP LONG TERM GOAL #5   Title pt will tell SLP 3 compensations for anomia    Status Achieved              Plan - 12/26/19 1351    Clinical Impression Statement Patient complained of 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 He now reports a decr in frequency of being asked to repeat, compared to proir to [place]. Anomia/aphasia x1 today, pt used description strategy. Pt agrees with d/c today.    Treatment/Interventions Aspiration precaution training;Language facilitation;Environmental controls;Cueing hierarchy;SLP instruction and feedback;Compensatory techniques;Functional tasks;Compensatory strategies;Patient/family education;Diet toleration management by SLP;Other (comment)   MBS if warranted   Potential to Achieve Goals Good    Consulted and Agree with Plan of Care Patient           Patient will benefit from skilled therapeutic intervention in order to improve the following deficits and impairments:   Dysarthria and anarthria  Aphasia  Dysphagia, unspecified type  SPEECH THERAPY DISCHARGE SUMMARY  Visits from Start of Care: 8  Current functional level related to goals / functional outcomes: Pt's volume has improved since initiation of ST, he used anomic compensation today in ST. SLP addressed overt s/sx aspiration. Pt agrees with d/c. See above for goal summary.   Remaining deficits: Intermittent mild anomia.   Education / Equipment: Anomia compensations, loud "ah", HEP, abdominal breathing   Plan: Patient agrees to discharge.  Patient goals were met. Patient is being discharged due to meeting the stated rehab goals.  ?????        Problem List Patient Active Problem List   Diagnosis Date Noted  . S/P TKR (total knee replacement), right 05/05/2019  . Osteoarthritis of right knee 05/02/2019  . Prostate cancer ([place]) 03/22/2018  . Malignant neoplasm of prostate ([place]) 02/27/2018    [facility] ,[place], [place]  12/26/2019, 1:57 PM  [facility] [address] [place], [place], [address] Phone: [phone]   Fax:  [phone]   Name: Matthew [name] MRN: [id number] Date of Birth: [birth year]

## 2019-12-26 NOTE — Patient Instructions (Signed)
  If you should have trouble with your swallowing (coughing, throat clearing, hard to get food down) contact Dr. Rexene Alberts.

## 2019-12-26 NOTE — Therapy (Signed)
[facility] [address] [place], [place], [address] Phone: [phone]   Fax:  [phone]  Physical Therapy Treatment  Patient Details  Name: Matthew [name] MRN: [id number] Date of Birth: March 22, 1951 Referring Provider (PT): Star Age 69  Encounter Date: 12/26/2019   PT End of Session - 12/26/19 2232    Visit Number 9    Number of Visits 17    Date for PT Re-Evaluation 01/28/20    Authorization Type [employer]    Progress Note Due on Visit 10    PT Start Time 1400    PT Stop Time 1445    PT Time Calculation (min) 45 min    Activity Tolerance Patient tolerated treatment well    Behavior During Therapy [facility] for tasks assessed/performed           Past Medical History:  Diagnosis Date  . Anxiety   . Arthritis   . GERD (gastroesophageal reflux disease)    occ remote history  . Hypertension   . Prostate cancer ([place])   . Right knee pain    questionable meniscal tear  . Seasonal allergies     Past Surgical History:  Procedure Laterality Date  . COLONOSCOPY    . Leg Laceration Repair  Left    Age 60  . PELVIC LYMPH NODE DISSECTION Bilateral 03/22/2018   Procedure: PELVIC LYMPH NODE DISSECTION;  Surgeon: Ceasar Mons, MD;  [facility];  Service: Urology;  Laterality: Bilateral;  . PROSTATE BIOPSY    . ROBOT ASSISTED LAPAROSCOPIC RADICAL PROSTATECTOMY N/A 03/22/2018   Procedure: XI ROBOTIC ASSISTED LAPAROSCOPIC PROSTATECTOMY;  Surgeon: Ceasar Mons, MD;  [facility];  Service: Urology;  Laterality: N/A;  . TOTAL KNEE ARTHROPLASTY Right 05/05/2019   Procedure: RIGHT TOTAL KNEE ARTHROPLASTY;  Surgeon: Frederik Pear, MD;  [facility];  Service: Orthopedics;  Laterality: Right;    There were no vitals filed for this visit.   Subjective Assessment - 12/26/19 1404    Subjective Pt reports I am walking better. I make mental effort tomake my arms move when I am  walking and makes me walk better. I feel like I have some balance issues when playing horseshoe toss with friends. Pt reports R knee bothers him when going up and down stairs.    Patient is accompained by: --   wife   Pertinent History hx of prostate cancer/removal, incontinence, hx of R TKR 04/2019    Patient Stated Goals Pt's goal for therapy is to improve walking, to have strength and be normal again.  Wants to get back to playing guitar.    Pain Onset More than a month ago              Treatment Stairs: 16 steps with one rail, cues to allow foot to pivot at edge of the step and letting the heel come up: pt reported minimal to no pain  Bean bag toss: cues to externally rotate lunging foot and keep BOS wider to improve stability, 2x, pt had LOB 4x but able to recover by himself with CGA  Manual therapy: R patella cephalic and caudal mobilization Foam roll massage to R [employer] with manual resistance: 20x 5" holds Quad sets with ankle on foam roller: 10x                         PT Short Term Goals - 12/23/19 1450  PT SHORT TERM GOAL #1   Title Pt will be indepedent with Parkinson's specific HEP to address balance, transfers, gait for improved overall mobility.  TARGET 12/26/2019 (delayed date due to delayed start-Pt wants OT, PT, speech together)    Baseline met on 12/23/19    Time 4    Period Weeks    Status Achieved    Target Date 12/26/19      PT SHORT TERM GOAL #2   Title Pt will perform at least 8 of 10 reps of sit<>stand transfers with no posterior lean or LOB.    Baseline 10 sit <> stands with no UE support without any posterior lean or LOB.    Time 4    Period Weeks    Status Achieved    Target Date 12/26/19      PT SHORT TERM GOAL #3   Title Pt will improve posterior push and release test to recover in less than 2 steps, for improved balance recovery.    Baseline 4 reps - 3 reps with single step and 1 rep with 3 steps to maintain  balance.    Time 4    Period Weeks    Status Achieved    Target Date 12/26/19      PT SHORT TERM GOAL #4   Title Pt will verbalize understanding of fall prevention education in home environment.    Baseline reviewed with pt on 12/23/19 and provided handout    Time 4    Period Weeks    Status Partially Met    Target Date 12/26/19             PT Long Term Goals - 10/30/19 1659      PT LONG TERM GOAL #1   Title Pt will be independent with progression of HEP for Parkinson's related deficits.  TARGET 01/23/2020 (delayed due to delayed start for coordination of schedules)    Time 8    Period Weeks    Status New    Target Date 01/23/20      PT LONG TERM GOAL #2   Title Pt will improve gait velocity to at least 2.8 ft/sec for improved gait efficiency and safety.    Baseline 2.5 ft/sec    Time 8    Period Weeks    Status New    Target Date 01/23/20      PT LONG TERM GOAL #3   Title Pt will improve MiniBESTest score to at least 23/28 for decreased fall risk.    Time 8    Period Weeks    Status New    Target Date 12/26/19      PT LONG TERM GOAL #4   Title Pt will improve TUG/TUG cognitive score to less than 10% difference for improved ability with dual tasking with gait.    Time 8    Period Weeks    Status New    Target Date 01/23/20      PT LONG TERM GOAL #5   Title Pt/wife will verbalize understanding of local Parkinson's disease-related resources, including ongoing community fitness.    Time 8    Period Weeks    Status New    Target Date 01/23/20                 Plan - 12/26/19 2229    Clinical Impression Statement pt tends to turn his foot in and has narrow BOS when throwing bean bag toss today. Upon cueing to improve external rotation of  foot and keeping wider base when stepping forward to throw bean bag, patient demonstrated improved balance and recovery after fwd lunging. With proper cueing, patient reported minimal to no pain in his R knee. Patient's knee  extension improved from -5 deg to 0 deg at end of the session.    Personal Factors and Comorbidities Comorbidity 3+    Comorbidities anxiety, arthritis, GERD, prostate cancer, HTN, s/p R TKR 04/2019    Examination-Activity Limitations Locomotion Level;Transfers;Stand;Stairs    Examination-Participation Restrictions Community Activity;Other   playing with grandchildren   Stability/Clinical Decision Making Evolving/Moderate complexity    Rehab Potential Good    PT Frequency 2x / week    PT Duration 8 weeks   plus eval   PT Treatment/Interventions ADLs/Self Care Home Management;Gait training;Functional mobility training;Therapeutic activities;Therapeutic exercise;Balance training;Neuromuscular re-education;DME Instruction;Patient/family education    PT Next Visit Plan Progress note next visit    PT Home Exercise Plan standing PWR moves    Consulted and Agree with Plan of Care Patient           Patient will benefit from skilled therapeutic intervention in order to improve the following deficits and impairments:  Abnormal gait, Difficulty walking, Decreased balance, Decreased mobility, Postural dysfunction, Decreased strength, Impaired flexibility  Visit Diagnosis: Abnormal posture  Other abnormalities of gait and mobility  Unsteadiness on feet  Muscle weakness (generalized)     Problem List Patient Active Problem List   Diagnosis Date Noted  . S/P TKR (total knee replacement), right 05/05/2019  . Osteoarthritis of right knee 05/02/2019  . Prostate cancer ([place]) 03/22/2018  . Malignant neoplasm of prostate ([place]) 02/27/2018    Kerrie Pleasure 12/26/2019, 10:38 PM  [facility] [address] [place], [place], [address] Phone: [phone]   Fax:  [phone]  Name: Matthew [name] MRN: [id number] Date of Birth: [birth year]

## 2019-12-29 ENCOUNTER — Ambulatory Visit: Payer: PPO | Admitting: Physical Therapy

## 2020-01-02 ENCOUNTER — Ambulatory Visit: Payer: PPO | Admitting: Physical Therapy

## 2020-01-05 ENCOUNTER — Other Ambulatory Visit: Payer: Self-pay

## 2020-01-05 ENCOUNTER — Encounter: Payer: Self-pay | Admitting: Physical Therapy

## 2020-01-05 ENCOUNTER — Ambulatory Visit: Payer: PPO | Admitting: Physical Therapy

## 2020-01-05 DIAGNOSIS — R278 Other lack of coordination: Secondary | ICD-10-CM | POA: Diagnosis not present

## 2020-01-05 DIAGNOSIS — M6281 Muscle weakness (generalized): Secondary | ICD-10-CM

## 2020-01-05 DIAGNOSIS — R293 Abnormal posture: Secondary | ICD-10-CM

## 2020-01-05 DIAGNOSIS — R2689 Other abnormalities of gait and mobility: Secondary | ICD-10-CM

## 2020-01-05 DIAGNOSIS — R29818 Other symptoms and signs involving the nervous system: Secondary | ICD-10-CM

## 2020-01-05 NOTE — Therapy (Signed)
[facility] [address] [place], [place], [address] Phone: [phone]   Fax:  [phone]  Physical Therapy Treatment/10th Visit Progress Note  Patient Details  Name: Matthew [name] MRN: [id number] Date of Birth: [birth year] Referring Provider (PT): Star Age   Encounter Date: 01/05/2020     01/05/20 1015  PT Visits / Re-Eval  Visit Number 10  Number of Visits 17  Date for PT Re-Evaluation 01/28/20  Authorization  Authorization Type [employer]  Progress Note Due on Visit 10  PT Time Calculation  PT Start Time 0933  PT Stop Time 1013  PT Time Calculation (min) 40 min  PT - End of Session  Equipment Utilized During Treatment Gait belt  Activity Tolerance Patient tolerated treatment well  Behavior During Therapy [facility] for tasks assessed/performed    10th Visit Physical Therapy Progress Note  Dates of Reporting Period: 10/30/19 to 01/05/20    Past Medical History:  Diagnosis Date  . Anxiety   . Arthritis   . GERD (gastroesophageal reflux disease)    occ remote history  . Hypertension   . Prostate cancer ([place])   . Right knee pain    questionable meniscal tear  . Seasonal allergies     Past Surgical History:  Procedure Laterality Date  . COLONOSCOPY    . Leg Laceration Repair  Left    Age 9  . PELVIC LYMPH NODE DISSECTION Bilateral 03/22/2018   Procedure: PELVIC LYMPH NODE DISSECTION;  Surgeon: Ceasar Mons, MD;  Location: [facility];  Service: Urology;  Laterality: Bilateral;  . PROSTATE BIOPSY    . ROBOT ASSISTED LAPAROSCOPIC RADICAL PROSTATECTOMY N/A 03/22/2018   Procedure: XI ROBOTIC ASSISTED LAPAROSCOPIC PROSTATECTOMY;  Surgeon: Ceasar Mons, MD;  Location: [facility];  Service: Urology;  Laterality: N/A;  . TOTAL KNEE ARTHROPLASTY Right 05/05/2019   Procedure: RIGHT TOTAL KNEE ARTHROPLASTY;  Surgeon: Frederik Pear, MD;  Location: [facility];  Service: Orthopedics;   Laterality: Right;    There were no vitals filed for this visit.   Subjective Assessment - 01/05/20 0934    Subjective The beach was good. Still feels like i'm on vacation mode. Balance was so so at the beach. No falls.    Patient is accompained by: --   wife   Pertinent History hx of prostate cancer/removal, incontinence, hx of R TKR 04/2019    Patient Stated Goals Pt's goal for therapy is to improve walking, to have strength and be normal again.  Wants to get back to playing guitar.    Currently in Pain? No/denies    Pain Onset More than a month ago              [facility] PT Assessment - 01/05/20 0939      Mini-BESTest   Sit To Stand Normal: Comes to stand without use of hands and stabilizes independently.    Rise to Toes Normal: Stable for 3 s with maximum height.    Compensatory Stepping Correction - Forward Normal: Recovers independently with a single, large step (second realignement is allowed).    Compensatory Stepping Correction - Left Lateral Normal: Recovers independently with 1 step (crossover or lateral OK)    Compensatory Stepping Correction - Right Lateral Normal: Recovers independently with 1 step (crossover or lateral OK)    Stepping Corredtion Lateral - lowest score 2    Stance - Feet together, eyes open, firm surface  Normal: 30s    Stance - Feet together, eyes closed, foam surface  Normal: 30s    Incline - Eyes Closed Normal: Stands independently 30s and aligns with gravity    Timed UP & GO with Dual Task Moderate: Dual Task affects either counting OR walking (>10%) when compared to the TUG without Dual Task.    Mini-BEST total score 24      Timed Up and Go Test   Normal TUG (seconds) 11.22    Cognitive TUG (seconds) 12.9                         [facility] Adult PT Treatment/Exercise - 01/05/20 0939      Ambulation/Gait   Ambulation/Gait Yes    Ambulation/Gait Assistance 5: Supervision    Ambulation/Gait Assistance Details initial cues for upright  posture, relaxed arm swing and hands    Ambulation Distance (Feet) 230 Feet    Assistive device None    Gait Pattern Step-through pattern;Decreased arm swing - right;Decreased arm swing - left;Decreased step length - right;Decreased step length - left;Right flexed knee in stance;Left flexed knee in stance;Decreased trunk rotation;Trunk flexed    Ambulation Surface Level;Indoor      Mini-BESTest   Stand on one leg (left) Moderate: < 20 s   4.5 seconds, 11.6 seconds   Stand on one leg (right) Normal: 20 s.   21 seconds both times   Stand on one leg - lowest score 1    Compensatory Stepping Correction - Backward Normal: Recovers independently with a single, large step    Change in Gait Speed Normal: Significantly changes walkling speed without imbalance    Walk with head turns - Horizontal Moderate: performs head turns with reduction in gait speed.    Walk with pivot turns Moderate:Turns with feet close SLOW (>4 steps) with good balance.    Step over obstacles Normal: Able to step over box with minimal change of gait speed and with good balance.      Exercises   Exercises Other Exercises      Lumbar Exercises: Aerobic   Other Aerobic Exercise SciFit with BLE and BUE for ROM, reciprocal movement, and strengthening for 6 minutes at gear 2.0, cues to keep rpm at 70-75               Balance Exercises - 01/05/20 0001      Balance Exercises: Standing   SLS Foam/compliant surface;Limitations    SLS Limitations on blue foam beam, 2 cones anteriorly - alternating forward cone taps x10 reps B, then progressing to adding a cognitive task x12 reps B asking pt to name foods in alphabetical order from A-Z, initial min guard - pt improving with incr reps    Step Over Hurdles / Cones 4 hurdles next to counter top: 2 smaller height and 2 larger height, alternating reciprocal pattern stepping over down and back 3 reps, then adding cognitive challenge/coordination having pt lift opposite arm when  stepping over hurdle, progressing to no UE support             PT Education - 01/05/20 1014    Education Details progress towards goals    Person(s) Educated Patient    Methods Explanation    Comprehension Verbalized understanding            PT Short Term Goals - 12/23/19 1450      PT SHORT TERM GOAL #1   Title Pt will be indepedent with Parkinson's specific HEP to address balance, transfers, gait for improved overall mobility.  TARGET 12/26/2019 (  delayed date due to delayed start-Pt wants OT, PT, speech together)    Baseline met on 12/23/19    Time 4    Period Weeks    Status Achieved    Target Date 12/26/19      PT SHORT TERM GOAL #2   Title Pt will perform at least 8 of 10 reps of sit<>stand transfers with no posterior lean or LOB.    Baseline 10 sit <> stands with no UE support without any posterior lean or LOB.    Time 4    Period Weeks    Status Achieved    Target Date 12/26/19      PT SHORT TERM GOAL #3   Title Pt will improve posterior push and release test to recover in less than 2 steps, for improved balance recovery.    Baseline 4 reps - 3 reps with single step and 1 rep with 3 steps to maintain balance.    Time 4    Period Weeks    Status Achieved    Target Date 12/26/19      PT SHORT TERM GOAL #4   Title Pt will verbalize understanding of fall prevention education in home environment.    Baseline reviewed with pt on 12/23/19 and provided handout    Time 4    Period Weeks    Status Partially Met    Target Date 12/26/19             PT Long Term Goals - 01/05/20 0958      PT LONG TERM GOAL #1   Title Pt will be independent with progression of HEP for Parkinson's related deficits.  TARGET 01/23/2020 (delayed due to delayed start for coordination of schedules)    Time 8    Period Weeks    Status New      PT LONG TERM GOAL #2   Title Pt will improve gait velocity to at least 2.8 ft/sec for improved gait efficiency and safety.    Baseline 2.5 ft/sec     Time 8    Period Weeks    Status New      PT LONG TERM GOAL #3   Title Pt will improve MiniBESTest score to at least 23/28 for decreased fall risk.    Baseline 24/28 on 01/05/20    Time 8    Period Weeks    Status Achieved      PT LONG TERM GOAL #4   Title Pt will improve TUG/TUG cognitive score to less than 10% difference for improved ability with dual tasking with gait.    Baseline TUG = 11.22 seconds, cog TUG = 12.9 seconds    Time 8    Period Weeks    Status On-going      PT LONG TERM GOAL #5   Title Pt/wife will verbalize understanding of local Parkinson's disease-related resources, including ongoing community fitness.    Time 8    Period Weeks    Status New                 Plan - 01/05/20 1154    Clinical Impression Statement 10th visit progress note: Focus of today's skilled session was assessing pt's LTGs. Pt improved miniBEST from a 20/28 to a 24/28- decr pt's risk of falls and meeting LTG #3. Pt improved his cog TUG time to 12.9 seconds, decr pt's risk of falls and indicating improved ability for dual tasking, but not quite to goal of 10% or less difference  than regular TUG. Remainder of session focused on gait training, reciprocal movement of BLE/BUE and SLS balance. Pt is progressing well, will continue to benefit from skilled PT to address remaining LTGs.    Personal Factors and Comorbidities Comorbidity 3+    Comorbidities anxiety, arthritis, GERD, prostate cancer, HTN, s/p R TKR 04/2019    Examination-Activity Limitations Locomotion Level;Transfers;Stand;Stairs    Examination-Participation Restrictions Community Activity;Other   playing with grandchildren   Stability/Clinical Decision Making Evolving/Moderate complexity    Rehab Potential Good    PT Frequency 2x / week    PT Duration 8 weeks   plus eval   PT Treatment/Interventions ADLs/Self Care Home Management;Gait training;Functional mobility training;Therapeutic activities;Therapeutic exercise;Balance  training;Neuromuscular re-education;DME Instruction;Patient/family education    PT Next Visit Plan SLS activities, dual tasking, standing PWR moves, SciFit for reciprocal movement.    PT Home Exercise Plan standing PWR moves    Consulted and Agree with Plan of Care Patient           Patient will benefit from skilled therapeutic intervention in order to improve the following deficits and impairments:  Abnormal gait, Difficulty walking, Decreased balance, Decreased mobility, Postural dysfunction, Decreased strength, Impaired flexibility  Visit Diagnosis: Other lack of coordination  Other abnormalities of gait and mobility  Muscle weakness (generalized)  Abnormal posture  Other symptoms and signs involving the nervous system     Problem List Patient Active Problem List   Diagnosis Date Noted  . S/P TKR (total knee replacement), right 05/05/2019  . Osteoarthritis of right knee 05/02/2019  . Prostate cancer ([place]) 03/22/2018  . Malignant neoplasm of prostate ([place]) 02/27/2018    Arliss Journey , PT, DPT  01/05/2020, 11:55 AM  [facility] [address] [place], [place], [address] Phone: [phone]   Fax:  [phone]  Name: Matthew [name] MRN: [id number] Date of Birth: [birth year]

## 2020-01-07 ENCOUNTER — Other Ambulatory Visit: Payer: Self-pay

## 2020-01-07 ENCOUNTER — Encounter: Payer: Self-pay | Admitting: Occupational Therapy

## 2020-01-07 ENCOUNTER — Ambulatory Visit: Payer: PPO | Admitting: Occupational Therapy

## 2020-01-07 DIAGNOSIS — R29818 Other symptoms and signs involving the nervous system: Secondary | ICD-10-CM

## 2020-01-07 DIAGNOSIS — M6281 Muscle weakness (generalized): Secondary | ICD-10-CM

## 2020-01-07 DIAGNOSIS — R2689 Other abnormalities of gait and mobility: Secondary | ICD-10-CM

## 2020-01-07 DIAGNOSIS — R278 Other lack of coordination: Secondary | ICD-10-CM | POA: Diagnosis not present

## 2020-01-07 DIAGNOSIS — R293 Abnormal posture: Secondary | ICD-10-CM

## 2020-01-07 NOTE — Therapy (Signed)
[facility] [address] [place], [place], [address] Phone: [phone]   Fax:  [phone]  Occupational Therapy Treatment  Patient Details  Name: Matthew [name] MRN: [id number] Date of Birth: 1951/01/11 Referring Provider (OT): Dr. Rexene Alberts   Encounter Date: 01/07/2020   OT End of Session - 01/07/20 1107    Visit Number 9    Number of Visits 25    Date for OT Re-Evaluation 03/02/20    Authorization Type [employer]    Authorization Time Period POC written for 12 weeks , pt goes on vacation after 4 weeks and may need to resume after pt returns- may d/c after 4-8 weeks though    Authorization - Visit Number 9    Authorization - Number of Visits 10    OT Start Time 1105    OT Stop Time 1145    OT Time Calculation (min) 40 min    Activity Tolerance Patient tolerated treatment well    Behavior During Therapy [facility] for tasks assessed/performed           Past Medical History:  Diagnosis Date  . Anxiety   . Arthritis   . GERD (gastroesophageal reflux disease)    occ remote history  . Hypertension   . Prostate cancer ([place])   . Right knee pain    questionable meniscal tear  . Seasonal allergies     Past Surgical History:  Procedure Laterality Date  . COLONOSCOPY    . Leg Laceration Repair  Left    Age 69  . PELVIC LYMPH NODE DISSECTION Bilateral 03/22/2018   Procedure: PELVIC LYMPH NODE DISSECTION;  Surgeon: Ceasar Mons, MD;  [facility];  Service: Urology;  Laterality: Bilateral;  . PROSTATE BIOPSY    . ROBOT ASSISTED LAPAROSCOPIC RADICAL PROSTATECTOMY N/A 03/22/2018   Procedure: XI ROBOTIC ASSISTED LAPAROSCOPIC PROSTATECTOMY;  Surgeon: Ceasar Mons, MD;  [facility];  Service: Urology;  Laterality: N/A;  . TOTAL KNEE ARTHROPLASTY Right 05/05/2019   Procedure: RIGHT TOTAL KNEE ARTHROPLASTY;  Surgeon: Frederik Pear, MD;  [facility];  Service: Orthopedics;  Laterality:  Right;    There were no vitals filed for this visit.   Subjective Assessment - 01/07/20 1131    Subjective  Pt reports he did not sleep well    Currently in Pain? No/denies                  Treatment:PWR! Basic 4 modified quadraped 10 reps each, min v.c/ facilitation Arm bike x 5 mins level 1 for reciprocal movement/ aerobic exercise, min v.c for smooth movements Pt reports difficulty swimming in pool at beach. Therapist recommends pt warm up with PWr! Moves before swimming and that the water provides resistance so big movements are very important in the pool. grooved pegs for increased fine motor coordination, min difficulty/ v.c for left and right UE's and larger amplitude movements            OT Education - 01/07/20 1150    Education Details community resources/ community fitness, importance of aerobic exercise, recommended stationary bike    Person(s) Educated Patient    Methods Explanation;Demonstration;Verbal cues;Handout    Comprehension Verbalized understanding;Returned demonstration;Verbal cues required            OT Short Term Goals - 12/23/19 1408      OT SHORT TERM GOAL #1   Title I with PD specific HEP    Time 4    Period Weeks  Status Achieved    Target Date 01/02/20      OT SHORT TERM GOAL #2   Title Pt will verbalize understanding of adapted strategies to maximize safety and I with ADLs/ IADLs .    Time 4    Period Weeks    Status Achieved    Target Date 01/02/20      OT SHORT TERM GOAL #3   Title Pt will  demonstrate improved LUE functional use for ADLs as evidenced by increasing box/ blocks score by 4 blocks with RUE    Baseline 41    Time 4    Period Weeks    Status Achieved   45     OT SHORT TERM GOAL #4   Title Pt will demonstrate increased ease with dressing as evidenced by decreasing PPT#4(don/ doff jacket) to 25 secs or less    Baseline 29.78    Time 4    Period Weeks    Status Achieved   10.60 secs     OT SHORT TERM  GOAL #5   Title Pt will retrieve a lightweight object from overhead shelf with -15 elbow extension in RUE    Time 4    Period Weeks    Status Achieved   -5 elbow ext            OT Long Term Goals - 01/07/20 1121      OT LONG TERM GOAL #1   Title Pt will verbalize understanding of ways to prevent future PD related complications and PD community resources.    Time 12    Period Weeks    Status On-going      OT LONG TERM GOAL #2   Title Pt will demonstrate improved fine motor coordination for ADLs as evidenced by decreasing 9 hole peg test score for RUE by 3 secs    Time 12    Period Weeks    Status On-going      OT LONG TERM GOAL #3   Title Pt will demonstrate improved ease with dressing as eveidenced by decreasing 3 button/ unbutton time to 30 secs or less    Baseline 34.68    Time 12    Period Weeks    Status On-going      OT LONG TERM GOAL #4   Title Pt will report increased ease with playing his guitar    Time 12    Period Weeks    Status On-going      OT LONG TERM GOAL #5   Title Pt will report pain in left shoulder no greater than 2/10 for ADLs.    Time 12    Period Weeks    Status On-going                 Plan - 01/07/20 1108    Clinical Impression Statement Pt is progressing towards goals. He demonstrates improving carryover of larger amplitude movement with functional activity.    OT Occupational Profile and History Detailed Assessment- Review of Records and additional review of physical, cognitive, psychosocial history related to current functional performance    Occupational performance deficits (Please refer to evaluation for details): ADL's;IADL's;Leisure;Social Participation    Body Structure / Function / Physical Skills ADL;Balance;Mobility;Strength;Tone;UE functional use;Flexibility;FMC;Coordination;Decreased knowledge of precautions;GMC;ROM;Decreased knowledge of use of DME;Dexterity;IADL    Rehab Potential Good    Clinical Decision Making  Limited treatment options, no task modification necessary    Comorbidities Affecting Occupational Performance: May have comorbidities impacting occupational performance  Modification or Assistance to Complete Evaluation  No modification of tasks or assist necessary to complete eval    OT Frequency 2x / week   may d/c after 4-5 weeks dep on progress   OT Duration 12 weeks   plan written for 12 weeks as pt is going on vacation for a week, pt may need to resume following vactions   OT Treatment/Interventions Self-care/ADL training;Therapeutic exercise;Functional Mobility Training;Balance training;Manual Therapy;Neuromuscular education;Ultrasound;Aquatic Therapy;Energy conservation;Therapeutic activities;DME and/or AE instruction;Paraffin;Cryotherapy;Fluidtherapy;Gait Training;Moist Heat;Contrast Bath;Passive range of motion;Patient/family education    Plan big movements with functional activity.strategies for ADLS, 10 th visit PN    Recommended Other Services Pt is only scheduled for 4 weeks as he goes on vacation after 4 weeks    Consulted and Agree with Plan of Care Patient           Patient will benefit from skilled therapeutic intervention in order to improve the following deficits and impairments:   Body Structure / Function / Physical Skills: ADL, Balance, Mobility, Strength, Tone, UE functional use, Flexibility, FMC, Coordination, Decreased knowledge of precautions, GMC, ROM, Decreased knowledge of use of DME, Dexterity, IADL       Visit Diagnosis: Other lack of coordination  Other abnormalities of gait and mobility  Muscle weakness (generalized)  Abnormal posture  Other symptoms and signs involving the nervous system    Problem List Patient Active Problem List   Diagnosis Date Noted  . S/P TKR (total knee replacement), right 05/05/2019  . Osteoarthritis of right knee 05/02/2019  . Prostate cancer ([place]) 03/22/2018  . Malignant neoplasm of prostate ([place]) 02/27/2018     Sharin Altidor 01/07/2020, 11:53 AM  [facility] [address] [place], [place], [address] Phone: [phone]   Fax:  [phone]  Name: Matthew [name] MRN: [id number] Date of Birth: [birth year]

## 2020-01-09 ENCOUNTER — Ambulatory Visit: Payer: PPO | Admitting: Physical Therapy

## 2020-01-12 ENCOUNTER — Ambulatory Visit: Payer: PPO | Admitting: Physical Therapy

## 2020-01-12 ENCOUNTER — Other Ambulatory Visit: Payer: Self-pay

## 2020-01-12 ENCOUNTER — Encounter: Payer: Self-pay | Admitting: Physical Therapy

## 2020-01-12 DIAGNOSIS — R278 Other lack of coordination: Secondary | ICD-10-CM | POA: Diagnosis not present

## 2020-01-12 DIAGNOSIS — R293 Abnormal posture: Secondary | ICD-10-CM

## 2020-01-12 DIAGNOSIS — R2689 Other abnormalities of gait and mobility: Secondary | ICD-10-CM

## 2020-01-12 DIAGNOSIS — M6281 Muscle weakness (generalized): Secondary | ICD-10-CM

## 2020-01-12 NOTE — Therapy (Signed)
[facility] [address] [place], [place], [address] Phone: [phone]   Fax:  [phone]  Physical Therapy Treatment  Patient Details  Name: Matthew [name] MRN: [id number] Date of Birth: 05/20/1951 Referring Provider (PT): Star Age   Encounter Date: 01/12/2020   PT End of Session - 01/12/20 1528    Visit Number 11    Number of Visits 17    Date for PT Re-Evaluation 01/28/20    Authorization Type [employer]    Progress Note Due on Visit 10    PT Start Time 1447    PT Stop Time 1528    PT Time Calculation (min) 41 min    Equipment Utilized During Treatment Gait belt    Activity Tolerance Patient tolerated treatment well    Behavior During Therapy [facility] for tasks assessed/performed           Past Medical History:  Diagnosis Date  . Anxiety   . Arthritis   . GERD (gastroesophageal reflux disease)    occ remote history  . Hypertension   . Prostate cancer ([place])   . Right knee pain    questionable meniscal tear  . Seasonal allergies     Past Surgical History:  Procedure Laterality Date  . COLONOSCOPY    . Leg Laceration Repair  Left    Age 69  . PELVIC LYMPH NODE DISSECTION Bilateral 03/22/2018   Procedure: PELVIC LYMPH NODE DISSECTION;  Surgeon: Ceasar Mons, MD;  Location: [facility];  Service: Urology;  Laterality: Bilateral;  . PROSTATE BIOPSY    . ROBOT ASSISTED LAPAROSCOPIC RADICAL PROSTATECTOMY N/A 03/22/2018   Procedure: XI ROBOTIC ASSISTED LAPAROSCOPIC PROSTATECTOMY;  Surgeon: Ceasar Mons, MD;  Location: [facility];  Service: Urology;  Laterality: N/A;  . TOTAL KNEE ARTHROPLASTY Right 05/05/2019   Procedure: RIGHT TOTAL KNEE ARTHROPLASTY;  Surgeon: Frederik Pear, MD;  Location: [facility];  Service: Orthopedics;  Laterality: Right;    There were no vitals filed for this visit.   Subjective Assessment - 01/12/20 1449    Subjective No falls. No new changes.    Patient  is accompained by: --   wife   Pertinent History hx of prostate cancer/removal, incontinence, hx of R TKR 04/2019    Patient Stated Goals Pt's goal for therapy is to improve walking, to have strength and be normal again.  Wants to get back to playing guitar.    Currently in Pain? No/denies    Pain Onset More than a month ago                NMR:     Pt performs PWR! Moves in standing position:    PWR! Up for improved posture x10 reps   PWR! [name] for improved weighshifting x10 reps B  PWR! Twist for improved trunk rotation x5 reps B -cues to stop and reset in middle and to perform with wider BOS   PWR! Step for improved step initiation x5 reps B, initial cues for incr step height .      Standing PWR Moves Flow - therapist performing in front of pt for visual cue, needs intermittent cues for wider BOS Up > [name] > Twist > Step  x7 reps, pt improving with sequencing with incr reps          [facility] Adult PT Treatment/Exercise - 01/12/20 1454      Ambulation/Gait   Ambulation/Gait Yes    Ambulation/Gait Assistance 5: Supervision    Ambulation/Gait Assistance  Details clinic distances between activities - initial cues for relaxed arm swing and stride length, pt with good carryover after initial cue for "big" walking    Assistive device None    Gait Pattern Step-through pattern;Decreased arm swing - right;Decreased arm swing - left;Decreased step length - right;Decreased step length - left;Right flexed knee in stance;Left flexed knee in stance;Decreased trunk rotation;Trunk flexed    Ambulation Surface Level;Indoor      Neuro Re-ed    Neuro Re-ed Details  On red mat for compliant surface: multidirectional stepping activity with reading off sheet, adding cognitive challenge by going the opposite direction, and then naming objects of the same color of the text, cues for wider BOS and step width/height when performing. No LOB, but pt performing more slowly when cognitively  challenged. On blue mat: stepping over 6" hurdle x10 reps B while pt naming band names for cognitive challenge       Lumbar Exercises: Aerobic   Other Aerobic Exercise SciFit with BLE and BUE for ROM, reciprocal movement, and strengthening for 6 minutes at gear 2.0, cues to keep rpm at 80, with last 45 second bout at 90 rpm      Knee/Hip Exercises: Standing   Forward Step Up Both;1 set;10 reps;Step Height: 6";Hand Hold: 0    Forward Step Up Limitations cues to float non-stance leg for SLS, cues for terminal knee extension on RLE                    PT Short Term Goals - 12/23/19 1450      PT SHORT TERM GOAL #1   Title Pt will be indepedent with Parkinson's specific HEP to address balance, transfers, gait for improved overall mobility.  TARGET 12/26/2019 (delayed date due to delayed start-Pt wants OT, PT, speech together)    Baseline met on 12/23/19    Time 4    Period Weeks    Status Achieved    Target Date 12/26/19      PT SHORT TERM GOAL #2   Title Pt will perform at least 8 of 10 reps of sit<>stand transfers with no posterior lean or LOB.    Baseline 10 sit <> stands with no UE support without any posterior lean or LOB.    Time 4    Period Weeks    Status Achieved    Target Date 12/26/19      PT SHORT TERM GOAL #3   Title Pt will improve posterior push and release test to recover in less than 2 steps, for improved balance recovery.    Baseline 4 reps - 3 reps with single step and 1 rep with 3 steps to maintain balance.    Time 4    Period Weeks    Status Achieved    Target Date 12/26/19      PT SHORT TERM GOAL #4   Title Pt will verbalize understanding of fall prevention education in home environment.    Baseline reviewed with pt on 12/23/19 and provided handout    Time 4    Period Weeks    Status Partially Met    Target Date 12/26/19             PT Long Term Goals - 01/05/20 0958      PT LONG TERM GOAL #1   Title Pt will be independent with progression of  HEP for Parkinson's related deficits.  TARGET 01/23/2020 (delayed due to delayed start for coordination of schedules)  Time 8    Period Weeks    Status New      PT LONG TERM GOAL #2   Title Pt will improve gait velocity to at least 2.8 ft/sec for improved gait efficiency and safety.    Baseline 2.5 ft/sec    Time 8    Period Weeks    Status New      PT LONG TERM GOAL #3   Title Pt will improve MiniBESTest score to at least 23/28 for decreased fall risk.    Baseline 24/28 on 01/05/20    Time 8    Period Weeks    Status Achieved      PT LONG TERM GOAL #4   Title Pt will improve TUG/TUG cognitive score to less than 10% difference for improved ability with dual tasking with gait.    Baseline TUG = 11.22 seconds, cog TUG = 12.9 seconds    Time 8    Period Weeks    Status On-going      PT LONG TERM GOAL #5   Title Pt/wife will verbalize understanding of local Parkinson's disease-related resources, including ongoing community fitness.    Time 8    Period Weeks    Status New                 Plan - 01/12/20 1844    Clinical Impression Statement Focus of today's skilled session was BLE strengthening and balance training with focus on cognitive dual tasking. Pt tolerated session well. With multi-directional stepping with cog task and standing PWR! flow, pt demonstrating improvement with incr reps. Will continue to progress towards LTGs.    Personal Factors and Comorbidities Comorbidity 3+    Comorbidities anxiety, arthritis, GERD, prostate cancer, HTN, s/p R TKR 04/2019    Examination-Activity Limitations Locomotion Level;Transfers;Stand;Stairs    Examination-Participation Restrictions Community Activity;Other   playing with grandchildren   Stability/Clinical Decision Making Evolving/Moderate complexity    Rehab Potential Good    PT Frequency 2x / week    PT Duration 8 weeks   plus eval   PT Treatment/Interventions ADLs/Self Care Home Management;Gait training;Functional  mobility training;Therapeutic activities;Therapeutic exercise;Balance training;Neuromuscular re-education;DME Instruction;Patient/family education    PT Next Visit Plan SLS activities, dual tasking with high level balance, standing PWR moves - do flow again , SciFit for reciprocal movement.    PT Home Exercise Plan standing PWR moves    Consulted and Agree with Plan of Care Patient           Patient will benefit from skilled therapeutic intervention in order to improve the following deficits and impairments:  Abnormal gait, Difficulty walking, Decreased balance, Decreased mobility, Postural dysfunction, Decreased strength, Impaired flexibility  Visit Diagnosis: Other lack of coordination  Other abnormalities of gait and mobility  Muscle weakness (generalized)  Abnormal posture     Problem List Patient Active Problem List   Diagnosis Date Noted  . S/P TKR (total knee replacement), right 05/05/2019  . Osteoarthritis of right knee 05/02/2019  . Prostate cancer ([place]) 03/22/2018  . Malignant neoplasm of prostate ([place]) 02/27/2018    Arliss Journey, PT, DPT  01/12/2020, 6:49 PM  [facility] [address] [place], [place], [address] Phone: [phone]   Fax:  [phone]  Name: Cesareo Vickrey MRN: [id number] Date of Birth: 03-Dec-1950

## 2020-01-14 ENCOUNTER — Encounter: Payer: Self-pay | Admitting: Neurology

## 2020-01-14 ENCOUNTER — Ambulatory Visit: Payer: PPO | Admitting: Neurology

## 2020-01-14 VITALS — BP 118/82 | HR 71 | Ht 66.0 in | Wt 167.0 lb

## 2020-01-14 DIAGNOSIS — G20 Parkinson's disease: Secondary | ICD-10-CM | POA: Diagnosis not present

## 2020-01-14 DIAGNOSIS — G4752 REM sleep behavior disorder: Secondary | ICD-10-CM | POA: Diagnosis not present

## 2020-01-14 DIAGNOSIS — G20C Parkinsonism, unspecified: Secondary | ICD-10-CM

## 2020-01-14 MED ORDER — CARBIDOPA-LEVODOPA 25-100 MG PO TABS: 2.0000 | ORAL_TABLET | Freq: Three times a day (TID) | ORAL | 3 refills | Status: DC

## 2020-01-14 NOTE — Progress Notes (Signed)
Subjective:    Patient ID: Matthew [name] is a 69 y.o. male.  HPI     Interim history:    Matthew [name] is a 69 year old right-handed gentleman with an underlying medical history of hypertension, reflux disease, arthritis with status post right total knee replacement, seasonal allergies, anxiety, and prostate cancer, status post prostate surgery in November 2019 and status post XRT in 2020, on Lupron inj, who presents for Follow-up consultation of his right-sided parkinsonism.  The patient is accompanied by his wife again today. I last saw him on 10/14/2019, at which time he was off the ropinirole.  He had side effects and these improved as he tapered off of it.  He had not had any recent falls but he did have a history of dream enactment behaviors and he was advised to start melatonin at night for sleep.  In addition, I suggested a trial of low-dose Sinemet generic with gradual increase.  I also advised him to seek evaluations through neuro rehab.  I made a referral to outpatient PT, OT and speech therapy.  Today, 01/14/2020: He reports feeling about the same.  Tremor is stable, he continues to have issues with urinary incontinence.  He has treatment-behaviors that have been about the same.  He tried melatonin a couple of times but he had some drowsiness the next day and eventually stopped using it.  He does go to bed late.  His wife reports that he is a night owl.  He will typically go to bed around 1 AM.  He generally sleeps in longer than she does.  He takes his Sinemet three times a day, usually around eight, twelve and 430 or five.  He used to take it more closer to 9 AM but when he started having therapy at neuro rehab he modified his schedule a little bit from nine, one and six to the current schedule.  He has benefited from therapy.  He was released by speech therapy but still has some occupational and physical therapy.  He has been on vacation with his family to the beach.  He did not enjoy it as  much, he tried to swim but found out the heart rate that he could not sleep very well when he was previously a very good and avid swimmer.  He was a little discouraged by it.  He tries to stay active.  He tries to hydrate well with water, he has had no falls.  He would like to start a supplement called restore Gold which contains multiple supplements including tryptophan, creatine and others, he has a list of the ingredients to show me.  He has not started it yet and will await modifications to his medication regimen by [place].  He has been noted to be a little bit more forgetful.  She has noticed that he has had more issues feeding himself.  He denies any significant anxiety or depression but his wife believes that he is a little bit more anxious.   The patient's allergies, current medications, family history, past medical history, past social history, past surgical history and problem list were reviewed and updated as appropriate.    Previously:    I first met him on 08/05/2019 at the request of his primary care physician, at which time the patient reported an approximately 2 or 2+ year history of right-sided fine motor dyscontrol particularly affect is in his right hand when he was playing the guitar.  His examination was in keeping with parkinsonism.  I  suggested we consider medication in the form of a dopamine agonist.  He was advised to proceed with a DaTscan. He called in the interim on 08/20/2019, and was ready to start ropinirole with gradual titration. He had a DaTscan on 10/02/2019 and I reviewed the results: IMPRESSION: Decreased radiotracer accumulation within the LEFT and RIGHT striatum with faint retention of within the heads of the caudate nuclei. The activity is decreased to a point that drug interference should be considered. 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   We called him with the test results.    His wife called in  the interim earlier this month because he had side effects with the ropinirole and I advised him to taper off of the medication.     08/05/19: (He) reports problems with his fine motor skills and coordination for the past 2+ years.  He feels that his symptoms are primarily noted in the right hand.  He has not had any tremors but has had difficulty with buttoning and also when playing the guitar.  He has been playing the guitar off and on for over 50 years.  He picked it up again after he retired some 2-1/2 years ago and noticed that he was having trouble.  He has had some issues with arthritis in the hands and attributed most of his difficulty to arthritis.  He has noticed stiffness.  In addition, he has noticed slowness as an it takes him longer to do things.  He has had some balance problems but no recent fall with the exception of a recent incident at home when he slipped off of a yoga exercise ball, he fell but did not injure himself.  I reviewed your telemedicine note from 03/10/2019 as well as office note from 07/04/2018.  He denies any mood related issues or cognitive issues.  He sleeps fairly well but does have vivid dreams and reports acting out in his dreams from time to time.  Once he fell out of bed, his wife has noted that he mumbles or talks in his sleep and also moves his limbs while asleep.  This has been going on for years as he recalls.  He does not smoke any cigarettes except for some smoking when he was a teenager reported.  He does smoke the pipe, 1 or 2/day.  He drinks alcohol in the form of some liquor, 1-2 servings per day on average.  He drinks caffeine in the form of coffee.  He had a knee replacement surgery last year.  He no longer takes any narcotic pain medication but does take ibuprofen 800 mg 2 or 3 times a day as needed.  He is on Lupron.   His Past Medical History Is Significant For: Past Medical History:  Diagnosis Date  . Anxiety   . Arthritis   . GERD (gastroesophageal  reflux disease)    occ remote history  . Hypertension   . Prostate cancer ([place])   . Right knee pain    questionable meniscal tear  . Seasonal allergies     His Past Surgical History Is Significant For: Past Surgical History:  Procedure Laterality Date  . COLONOSCOPY    . Leg Laceration Repair  Left    Age 41  . PELVIC LYMPH NODE DISSECTION Bilateral 03/22/2018   Procedure: PELVIC LYMPH NODE DISSECTION;  Surgeon: Ceasar Mons, MD;  [facility]: [facility];  Service: Urology;  Laterality: Bilateral;  . PROSTATE BIOPSY    .  ROBOT ASSISTED LAPAROSCOPIC RADICAL PROSTATECTOMY N/A 03/22/2018   Procedure: XI ROBOTIC ASSISTED LAPAROSCOPIC PROSTATECTOMY;  Surgeon: Ceasar Mons, MD;  Location: [facility];  Service: Urology;  Laterality: N/A;  . TOTAL KNEE ARTHROPLASTY Right 05/05/2019   Procedure: RIGHT TOTAL KNEE ARTHROPLASTY;  Surgeon: Frederik Pear, MD;  Location: [facility];  Service: Orthopedics;  Laterality: Right;    His Family History Is Significant For: Family History  Problem Relation Age of Onset  . Breast cancer Mother        67s  . Heart attack Father 36  . Colon cancer Maternal Grandmother   . Prostate cancer Neg Hx   . Pancreatic cancer Neg Hx     His Social History Is Significant For: Social History   Socioeconomic History  . Marital status: Married    Spouse name: Not on file  . Number of children: 2  . Years of education: Not on file  . Highest education level: Not on file  Occupational History  . Not on file  Tobacco Use  . Smoking status: Current Every Day Smoker    Years: 20.00    Types: Pipe  . Smokeless tobacco: Never Used  Vaping Use  . Vaping Use: Never used  Substance and Sexual Activity  . Alcohol use: Yes    Alcohol/week: 1.0 - 2.0 standard drink    Types: 1 - 2 Shots of liquor per week    Comment: per day  . Drug use: Never  . Sexual activity: Not Currently  Other Topics Concern  . Not on file  Social History Narrative  . Not on  file   Social Determinants of Health   Financial Resource Strain:   . Difficulty of Paying Living Expenses: Not on file  Food Insecurity:   . Worried About Charity fundraiser in the Last Year: Not on file  . Ran Out of Food in the Last Year: Not on file  Transportation Needs:   . Lack of Transportation (Medical): Not on file  . Lack of Transportation (Non-Medical): Not on file  Physical Activity:   . Days of Exercise per Week: Not on file  . Minutes of Exercise per Session: Not on file  Stress:   . Feeling of Stress : Not on file  Social Connections:   . Frequency of Communication with Friends and Family: Not on file  . Frequency of Social Gatherings with Friends and Family: Not on file  . Attends Religious Services: Not on file  . Active Member of Clubs or Organizations: Not on file  . Attends Archivist Meetings: Not on file  . Marital Status: Not on file    His Allergies Are:  Allergies  Allergen Reactions  . Other     Senior dose of flu shot, cause hear palpitations, anxious  . Peanut-Containing Drug Products Other (See Comments)    Nasal congestion  :   His Current Medications Are:  Outpatient Encounter Medications as of 01/14/2020  Medication Sig  . carbidopa-levodopa (SINEMET IR) 25-100 MG tablet Take 1/2 pill twice daily x 1 week, then 1/2 pill 3 times a day x 1 week, then 1 pill 3 times a day thereafter.  . cetirizine (ZYRTEC) 10 MG tablet Take 10 mg by mouth every morning.   . fluticasone (FLONASE) 50 MCG/ACT nasal spray Place 1-2 sprays into both nostrils daily as needed for allergies or rhinitis.  Marland Kitchen ibuprofen (ADVIL) 800 MG tablet Take 800 mg by mouth in the morning and at  bedtime.  Marland Kitchen leuprolide, 6 Month, (ELIGARD) 45 MG injection Inject 45 mg into the skin every 6 (six) months.  Marland Kitchen lisinopril (ZESTRIL) 20 MG tablet Take 20 mg by mouth daily.  Marland Kitchen loratadine (CLARITIN) 10 MG tablet Take 10 mg by mouth daily.  . mirabegron ER (MYRBETRIQ) 50 MG TB24  tablet Take 50 mg by mouth daily.   No facility-administered encounter medications on file as of 01/14/2020.  :  Review of Systems:  Out of a complete 14 point review of systems, all are reviewed and negative with the exception of these symptoms as listed below: Review of Systems  Neurological:       Here for 3 month f/u on PD. Pt reports since his last visit the carb/levo does not seem to be helping. Pt reports the PT and OT therapy has helped. Denies any falls since a last visit.    Objective:  Neurological Exam  Physical Exam Physical Examination:   Vitals:   01/14/20 1256  BP: 118/82  Pulse: 71  SpO2: 98%    General Examination: The patient is a very pleasant 69 y.o. male in no acute distress. He appears well-developed and well-nourished and well groomed.   HEENT:Normocephalic, atraumatic, pupils are equal, round and reactive to light, moderate nuchal rigidity, extraocular tracking is mildly impaired.  He has mild to perhaps moderate facial masking.  Hearing is intact, no significant leaning of his upper body noted.  He has no significant lip or jaw tremor.  He has mild hypophonia.  Airway examination reveals no significant mouth dryness, no significant drooling, tongue protrudes centrally and palate elevates symmetrically.   Chest:Clear to auscultation without wheezing, rhonchi or crackles noted.  Heart:S1+S2+0, regular and normal without murmurs, rubs or gallops noted.   Abdomen:Soft, non-tender and non-distended with normal bowel sounds appreciated on auscultation.  Extremities:There istracepitting edema in the R ankle, stable.  Skin: Warm and dry without trophic changes noted.  Musculoskeletal: exam reveals unremarkable right knee replacement scar.   Neurologically:  Mental status: The patient is awake, alert and oriented in all 4 spheres.Hisimmediate and remote memory, attention, language skills and fund of knowledge are appropriate. There is no  evidence of aphasia, agnosia, apraxia or anomia. Speech is clear with normal prosody and enunciation. Thought process is linear. Mood is normaland affect is normal.  Cranial nerves II - XII are as described above under HEENT exam. In addition: shoulder shrug is normal with equal shoulder height noted. Motor exam: Normal bulk, strength. There is no drift,resting, postural or actiontremor.   (On3/16/2021:On Archimedes spiral drawing he has no significant difficulty with the right hand, slight insecurity noted with the left hand, handwriting is legible for the most part but micrographic.)  Fine motor skills and coordination: He has fine motor difficulty bilaterally, right more than left, right more than the moderate range.  He has mild to moderate bradykinesia.  No dyskinesias noted.  Tone is increased in both upper extremities.  Gait, station and balance: He stands up with mild difficulty and pushes himself up.  Posture is mildly stooped, has decreased arm swing bilaterally.  No obvious shuffling noted.  Cerebellar testing: No dysmetria or intention tremor. There is no truncal or gait ataxia.  Sensory exam: intact to light touch in the upper and lower extremities.  Assessmentand Plan:   In summary,Matthew Hiltonis a very pleasant 67 year oldmalewith an underlying medical history of hypertension, reflux disease, arthritiswith status post right total knee replacement, seasonal allergies, anxiety, and prostate cancer, With status  post prostate surgery in November 2019 and status post XRT in 2020, whopresents for follow-up consultation of his parkinsonism with symptoms of over 2 years, and some lateralization noted to the right.  He does not have a telltale resting tremor.  His DaTscan was supportive of an underlying parkinsonian syndrome.  He had noted some benefit from ropinirole but side effects outweighed the benefit, had significant exacerbation of his urinary incontinence and severe  somnolence from it.   He is on Lupron injections.  He is in close follow-up with urology.  Constipation has been an issue but he is proactive about it and takes Colace twice daily and Metamucil as needed.  He is advised to stay well-hydrated and continue to try to stay active physically.  He tries to stay mentally active.  He has benefited from therapy through neuro rehab and is still in physical therapy and Occupational Therapy.  He has been on Sinemet 1 pill 3 times daily.  He has not noticed a telltale response from it but no significant side effects.  He is advised to increase it to 1-1/2 pills three times a day for the next 2 weeks with a goal to increase it to 2 pills three times a day thereafter.  We talked about the importance of good rest and sleep and enough time for sleep.  He is advised to try to have a set bedtime.  He is advised to take melatonin consistently either 3 mg or up to 6 mg about 2 hours before his bedtime.  He is willing to try this again.  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  He is advised to follow-up with [place] in 3 months, sooner if needed.  We will monitor his symptoms of forgetfulness. I answered all the questions today the patient and his wife are in agreement.

## 2020-01-14 NOTE — Patient Instructions (Signed)
It was good to see you both again today.  As discussed, our plan is for you to gradually increase your Sinemet.  Please increase to 1-1/2 pills three times a day for the next 2 weeks and then increase to 2 pills 3 times daily thereafter.  We may consider another medication such as the Neupro patch next.  Please continue to work with your occupational or physical therapist and continue to exercise on a regular basis, such as walking.  Ultimately, if you can get back into swimming on a regular basis I think it would be great but not a necessity. We will continue to monitor your memory and cognitive function.  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

## 2020-01-15 ENCOUNTER — Ambulatory Visit: Payer: PPO | Admitting: Physical Therapy

## 2020-01-15 ENCOUNTER — Encounter: Payer: PPO | Admitting: Occupational Therapy

## 2020-01-16 ENCOUNTER — Other Ambulatory Visit: Payer: Self-pay

## 2020-01-16 ENCOUNTER — Ambulatory Visit: Payer: PPO | Admitting: Physical Therapy

## 2020-01-16 DIAGNOSIS — R278 Other lack of coordination: Secondary | ICD-10-CM

## 2020-01-16 DIAGNOSIS — R293 Abnormal posture: Secondary | ICD-10-CM

## 2020-01-16 DIAGNOSIS — M6281 Muscle weakness (generalized): Secondary | ICD-10-CM

## 2020-01-16 DIAGNOSIS — R2689 Other abnormalities of gait and mobility: Secondary | ICD-10-CM

## 2020-01-16 NOTE — Patient Instructions (Signed)
WALKING  Walking is a great form of exercise to increase your strength, endurance and overall fitness.  A walking program can help you start slowly and gradually build endurance as you go.  Everyone's ability is different, so each person's starting point will be different.  You do not have to follow them exactly.  The are just samples. You should simply find out what's right for you and stick to that program.   In the beginning, you'll start off walking 2-3 times a day for short distances.  As you get stronger, you'll be walking further at just 1-2 times per day.  A. You Can Walk For A Certain Length Of Time Each Day    Walk 5 minutes 3 times per day.  Increase 2 minutes every 2 days (3 times per day).  Work up to 25-30 minutes (1-2 times per day).   Example:   Day 1-2 6 minutes 3 times per day   Day 7-8 12 minutes 2-3 times per day   Day 13-14 25 minutes 1-2 times per day

## 2020-01-16 NOTE — Therapy (Signed)
[facility] [address] [place], [place], [address] Phone: [phone]   Fax:  [phone]  Physical Therapy Treatment  Patient Details  Name: Matthew [name] MRN: [id number] Date of Birth: 1951-05-11 Referring Provider (PT): Star Age   Encounter Date: 01/16/2020   PT End of Session - 01/16/20 1420    Visit Number 12    Number of Visits 17    Date for PT Re-Evaluation 01/28/20    Authorization Type [employer]    Progress Note Due on Visit 10    PT Start Time 1402    PT Stop Time 1444    PT Time Calculation (min) 42 min    Equipment Utilized During Treatment Gait belt    Activity Tolerance Patient tolerated treatment well    Behavior During Therapy [facility] for tasks assessed/performed           Past Medical History:  Diagnosis Date  . Anxiety   . Arthritis   . GERD (gastroesophageal reflux disease)    occ remote history  . Hypertension   . Prostate cancer ([place])   . Right knee pain    questionable meniscal tear  . Seasonal allergies     Past Surgical History:  Procedure Laterality Date  . COLONOSCOPY    . Leg Laceration Repair  Left    Age 5  . PELVIC LYMPH NODE DISSECTION Bilateral 03/22/2018   Procedure: PELVIC LYMPH NODE DISSECTION;  Surgeon: Ceasar Mons, MD;  Location: [facility];  Service: Urology;  Laterality: Bilateral;  . PROSTATE BIOPSY    . ROBOT ASSISTED LAPAROSCOPIC RADICAL PROSTATECTOMY N/A 03/22/2018   Procedure: XI ROBOTIC ASSISTED LAPAROSCOPIC PROSTATECTOMY;  Surgeon: Ceasar Mons, MD;  Location: [facility];  Service: Urology;  Laterality: N/A;  . TOTAL KNEE ARTHROPLASTY Right 05/05/2019   Procedure: RIGHT TOTAL KNEE ARTHROPLASTY;  Surgeon: Frederik Pear, MD;  Location: [facility];  Service: Orthopedics;  Laterality: Right;    There were no vitals filed for this visit.   Subjective Assessment - 01/16/20 1408    Subjective Saw Dr. Rexene Alberts the other day -  increased his carbidopa levodopa, feeling better today. Played horseshoes the other night and felt it was easier because he was thinking about his big movements.    Patient is accompained by: --   wife   Pertinent History hx of prostate cancer/removal, incontinence, hx of R TKR 04/2019    Patient Stated Goals Pt's goal for therapy is to improve walking, to have strength and be normal again.  Wants to get back to playing guitar.    Pain Onset More than a month ago                         Standing PWR Moves Flow - therapist performing in front of pt for visual cues, needs intermittent cues for wider BOS and to stop and reset in middle for PWR twist.  Up > [name] > Twist > Step  x8 reps, pt improving with sequencing with incr reps - verbally added to pt's HEP, to switch off performing days with standing PWR moves and standing PWR moves flow for incr cognitive challenge      [facility] Adult PT Treatment/Exercise - 01/16/20 0001      Ambulation/Gait   Ambulation/Gait Yes    Ambulation/Gait Assistance 5: Supervision    Ambulation/Gait Assistance Details pt able to maintain gait mechanics with arm swing and incr stride length while maintaining convo with  therapist    Ambulation Distance (Feet) 345 Feet    Assistive device None    Gait Pattern Step-through pattern;Decreased arm swing - right;Decreased arm swing - left;Decreased step length - right;Decreased step length - left;Right flexed knee in stance;Left flexed knee in stance;Decreased trunk rotation;Trunk flexed    Ambulation Surface Level;Indoor      Therapeutic Activites    Therapeutic Activities Other Therapeutic Activities    Other Therapeutic Activities Pt asking about aerobic activity, therapist having discussion with pt about looking into the [facility] for use of aerobic equipment (such as SciFit, recumbent bike, NuStep) - pt reporting he thinks he has a silver sneakers membership and will look into it. Also states his clubhouse at  home has some equipment in it, discussed about him taking pictures and to bring in to next session to determine what would be safe for pt to use. Also initiated walking program, with focus on incr intensity and big movements when performing. Pt was provided community resources a couple sessions ago by OT, said he had not had the chance to look at the different exercise options (therapist suggesting possible PD cycle class at the [employer]). Pt stating he will take a look at it before next session about any other potential exercise options       Neuro Re-ed    Neuro Re-ed Details  on black side of BOSU with wider BOS: x10 reps standing PWR! Up with min guard for balance               Balance Exercises - 01/16/20 0001      Balance Exercises: Standing   SLS Intermittent upper extremity support;Foam/compliant surface;Limitations    SLS Limitations perfomed 6 stepping stones next to counter for SLS and larger steps, performed 2 reps down and back (beginning with single UE support and progressing to none), and then performed an additional 2 reps down and back with reciprocal arm swing    Other Standing Exercises With smaller orange hurdle in forwards and both lateral directions, practiced stepping over each obstacle/stepping backwards with directions of numbers on clock (3, 6, 9, 12), then practiced giving 3 step directions of stepping, and then progressing to pt standing on blue mat for compliant surface, cues for weight shifting when stepping over obstacle              PT Education - 01/16/20 1514    Education Details walking program for home, see TA, alternating days performing standing PWR moves and PWR moves flow    Person(s) Educated Patient    Methods Explanation;Demonstration    Comprehension Verbalized understanding;Returned demonstration            PT Short Term Goals - 12/23/19 1450      PT SHORT TERM GOAL #1   Title Pt will be indepedent with Parkinson's specific HEP to  address balance, transfers, gait for improved overall mobility.  TARGET 12/26/2019 (delayed date due to delayed start-Pt wants OT, PT, speech together)    Baseline met on 12/23/19    Time 4    Period Weeks    Status Achieved    Target Date 12/26/19      PT SHORT TERM GOAL #2   Title Pt will perform at least 8 of 10 reps of sit<>stand transfers with no posterior lean or LOB.    Baseline 10 sit <> stands with no UE support without any posterior lean or LOB.    Time 4    Period Weeks  Status Achieved    Target Date 12/26/19      PT SHORT TERM GOAL #3   Title Pt will improve posterior push and release test to recover in less than 2 steps, for improved balance recovery.    Baseline 4 reps - 3 reps with single step and 1 rep with 3 steps to maintain balance.    Time 4    Period Weeks    Status Achieved    Target Date 12/26/19      PT SHORT TERM GOAL #4   Title Pt will verbalize understanding of fall prevention education in home environment.    Baseline reviewed with pt on 12/23/19 and provided handout    Time 4    Period Weeks    Status Partially Met    Target Date 12/26/19             PT Long Term Goals - 01/05/20 0958      PT LONG TERM GOAL #1   Title Pt will be independent with progression of HEP for Parkinson's related deficits.  TARGET 01/23/2020 (delayed due to delayed start for coordination of schedules)    Time 8    Period Weeks    Status New      PT LONG TERM GOAL #2   Title Pt will improve gait velocity to at least 2.8 ft/sec for improved gait efficiency and safety.    Baseline 2.5 ft/sec    Time 8    Period Weeks    Status New      PT LONG TERM GOAL #3   Title Pt will improve MiniBESTest score to at least 23/28 for decreased fall risk.    Baseline 24/28 on 01/05/20    Time 8    Period Weeks    Status Achieved      PT LONG TERM GOAL #4   Title Pt will improve TUG/TUG cognitive score to less than 10% difference for improved ability with dual tasking with gait.      Baseline TUG = 11.22 seconds, cog TUG = 12.9 seconds    Time 8    Period Weeks    Status On-going      PT LONG TERM GOAL #5   Title Pt/wife will verbalize understanding of local Parkinson's disease-related resources, including ongoing community fitness.    Time 8    Period Weeks    Status New                 Plan - 01/16/20 1515    Clinical Impression Statement Had discussion about aerobic exercise with pt - initiated walking program for home and asked pt to look into the [employer]/silver sneakers membership and to take pictures on his phone of what aerobic equipment is at his clubhouse to use (should bring to next session). Remainder of session focused on cog dual tasking with balance/PWR moves and balance on compliant surfaces. Pt tolerated session well, will continue to progress towards LTGs.    Personal Factors and Comorbidities Comorbidity 3+    Comorbidities anxiety, arthritis, GERD, prostate cancer, HTN, s/p R TKR 04/2019    Examination-Activity Limitations Locomotion Level;Transfers;Stand;Stairs    Examination-Participation Restrictions Community Activity;Other   playing with grandchildren   Stability/Clinical Decision Making Evolving/Moderate complexity    Rehab Potential Good    PT Frequency 2x / week    PT Duration 8 weeks   plus eval   PT Treatment/Interventions ADLs/Self Care Home Management;Gait training;Functional mobility training;Therapeutic activities;Therapeutic exercise;Balance training;Neuromuscular re-education;DME Instruction;Patient/family education  PT Next Visit Plan how was standing PWR flow for home? did pt bring in pictures of aerobic equipment at his clubhouse at home?, SLS activities, dual tasking with high level balance and on compliant surfaces, SciFit for reciprocal movement.    PT Home Exercise Plan standing PWR moves    Consulted and Agree with Plan of Care Patient           Patient will benefit from skilled therapeutic intervention in order  to improve the following deficits and impairments:  Abnormal gait, Difficulty walking, Decreased balance, Decreased mobility, Postural dysfunction, Decreased strength, Impaired flexibility  Visit Diagnosis: Other lack of coordination  Other abnormalities of gait and mobility  Muscle weakness (generalized)  Abnormal posture     Problem List Patient Active Problem List   Diagnosis Date Noted  . S/P TKR (total knee replacement), right 05/05/2019  . Osteoarthritis of right knee 05/02/2019  . Prostate cancer ([place]) 03/22/2018  . Malignant neoplasm of prostate ([place]) 02/27/2018    Yailyn Strack N Jeilani Grupe 01/16/2020, 3:24 PM  [facility] [address] [place], [place], [address] Phone: [phone]   Fax:  [phone]  Name: Matthew [name] MRN: [id number] Date of Birth: 09-11-1950

## 2020-01-21 ENCOUNTER — Other Ambulatory Visit: Payer: Self-pay

## 2020-01-21 ENCOUNTER — Encounter: Payer: Self-pay | Admitting: Occupational Therapy

## 2020-01-21 ENCOUNTER — Encounter: Payer: Self-pay | Admitting: Physical Therapy

## 2020-01-21 ENCOUNTER — Ambulatory Visit: Payer: PPO | Admitting: Physical Therapy

## 2020-01-21 ENCOUNTER — Ambulatory Visit: Payer: PPO | Attending: Neurology | Admitting: Occupational Therapy

## 2020-01-21 DIAGNOSIS — M6281 Muscle weakness (generalized): Secondary | ICD-10-CM | POA: Diagnosis present

## 2020-01-21 DIAGNOSIS — R2689 Other abnormalities of gait and mobility: Secondary | ICD-10-CM | POA: Diagnosis not present

## 2020-01-21 DIAGNOSIS — R293 Abnormal posture: Secondary | ICD-10-CM | POA: Diagnosis not present

## 2020-01-21 DIAGNOSIS — R278 Other lack of coordination: Secondary | ICD-10-CM

## 2020-01-21 DIAGNOSIS — R29818 Other symptoms and signs involving the nervous system: Secondary | ICD-10-CM | POA: Diagnosis not present

## 2020-01-21 DIAGNOSIS — R2681 Unsteadiness on feet: Secondary | ICD-10-CM | POA: Insufficient documentation

## 2020-01-21 NOTE — Therapy (Signed)
[facility] [address] [place], [place], [address] Phone: [phone]   Fax:  [phone]  Occupational Therapy Treatment  Patient Details  Name: Matthew [name] MRN: [id number] Date of Birth: 05-29-1950 Referring Provider (OT): Dr. Rexene Alberts   Encounter Date: 01/21/2020   OT End of Session - 01/21/20 0942    Visit Number 10    Number of Visits 25    Date for OT Re-Evaluation 03/02/20    Authorization Type [employer]    Authorization Time Period POC written for 12 weeks , pt goes on vacation after 4 weeks and may need to resume after pt returns- may d/c after 4-8 weeks though    Authorization - Visit Number 10    Authorization - Number of Visits 20    OT Start Time 0940    OT Stop Time 1015    OT Time Calculation (min) 35 min    Activity Tolerance Patient tolerated treatment well    Behavior During Therapy [facility] for tasks assessed/performed           Past Medical History:  Diagnosis Date  . Anxiety   . Arthritis   . GERD (gastroesophageal reflux disease)    occ remote history  . Hypertension   . Prostate cancer ([place])   . Right knee pain    questionable meniscal tear  . Seasonal allergies     Past Surgical History:  Procedure Laterality Date  . COLONOSCOPY    . Leg Laceration Repair  Left    Age 69  . PELVIC LYMPH NODE DISSECTION Bilateral 03/22/2018   Procedure: PELVIC LYMPH NODE DISSECTION;  Surgeon: Ceasar Mons, MD;  [facility];  Service: Urology;  Laterality: Bilateral;  . PROSTATE BIOPSY    . ROBOT ASSISTED LAPAROSCOPIC RADICAL PROSTATECTOMY N/A 03/22/2018   Procedure: XI ROBOTIC ASSISTED LAPAROSCOPIC PROSTATECTOMY;  Surgeon: Ceasar Mons, MD;  [facility];  Service: Urology;  Laterality: N/A;  . TOTAL KNEE ARTHROPLASTY Right 05/05/2019   Procedure: RIGHT TOTAL KNEE ARTHROPLASTY;  Surgeon: Frederik Pear, MD;  [facility];  Service: Orthopedics;  Laterality:  Right;    There were no vitals filed for this visit.   Subjective Assessment - 01/21/20 0942    Subjective  Pt reports intermittant RUE pain    Pertinent History PMH:  PD newly diagnosed , anxiety, arthritis, GERD, prostate cancer, HTN, s/p R TKR 04/2019    Limitations hx of CA, use caution with modalities.    Patient Stated Goals to play guitar again    Currently in Pain? Yes    Pain Score 3     Pain Location Arm    Pain Orientation Right    Pain Descriptors / Indicators Aching    Pain Type Chronic pain    Pain Onset More than a month ago    Pain Frequency Intermittent    Aggravating Factors  unknown    Pain Relieving Factors repositioning                 Treatment: Pt reports intermittant RUE shoulder pain. Attempted PWR! [name] in supine however pt has discomfort. Pt performed PWR! Up supine, gentle joint mobs to right shoulder and cueing to avoid internal rotation. Closed chain shoulder flexion with emphasis on proper shoulder positioning in supine, pt was pain free. Modified quadraped PWR! Up and rock at tabletop, min v.c for shoulder positioning. Digi flex yellow for RUE individual finger flexion, PWR! Hands prior to task  OT Education - 01/21/20 1422    Education Details ways to prevent future PD related complications, postioning to minimize RUE shoulder pain(avoiding internal rotation)    Person(s) Educated Patient    Methods Explanation;Demonstration;Verbal cues    Comprehension Verbalized understanding;Returned demonstration;Verbal cues required            OT Short Term Goals - 12/23/19 1408      OT SHORT TERM GOAL #1   Title I with PD specific HEP    Time 4    Period Weeks    Status Achieved    Target Date 01/02/20      OT SHORT TERM GOAL #2   Title Pt will verbalize understanding of adapted strategies to maximize safety and I with ADLs/ IADLs .    Time 4    Period Weeks    Status Achieved    Target Date 01/02/20      OT  SHORT TERM GOAL #3   Title Pt will  demonstrate improved LUE functional use for ADLs as evidenced by increasing box/ blocks score by 4 blocks with RUE    Baseline 41    Time 4    Period Weeks    Status Achieved   45     OT SHORT TERM GOAL #4   Title Pt will demonstrate increased ease with dressing as evidenced by decreasing PPT#4(don/ doff jacket) to 25 secs or less    Baseline 29.78    Time 4    Period Weeks    Status Achieved   10.60 secs     OT SHORT TERM GOAL #5   Title Pt will retrieve a lightweight object from overhead shelf with -15 elbow extension in RUE    Time 4    Period Weeks    Status Achieved   -5 elbow ext            OT Long Term Goals - 01/21/20 1437      OT LONG TERM GOAL #1   Title Pt will verbalize understanding of ways to prevent future PD related complications and PD community resources.    Time 12    Period Weeks    Status Achieved   education provided, pt verbalizes understanding.     OT LONG TERM GOAL #2   Title Pt will demonstrate improved fine motor coordination for ADLs as evidenced by decreasing 9 hole peg test score for RUE by 3 secs    Time 12    Period Weeks    Status On-going      OT LONG TERM GOAL #3   Title Pt will demonstrate improved ease with dressing as eveidenced by decreasing 3 button/ unbutton time to 30 secs or less    Baseline 34.68    Time 12    Period Weeks    Status On-going      OT LONG TERM GOAL #4   Title Pt will report increased ease with playing his guitar    Time 12    Period Weeks    Status On-going      OT LONG TERM GOAL #5   Title Pt will report pain in left shoulder no greater than 2/10 for ADLs.    Time 12    Period Weeks    Status On-going                 Plan - 01/21/20 1433    Clinical Impression Statement For the reporting period of 12/02/19 to 01/21/20 pt demonstrates  good overall progress towards goals. He has achieved all short term goals and is progressing towards long term goals. Pt can  benefit from continued skilled occupational therapy to address: coordination, bradykinesia, decreased balance, shoulder pain and decreased functional mobility in order to maximize pt's safety and I with ADLs/ I ADLs.    OT Occupational Profile and History Detailed Assessment- Review of Records and additional review of physical, cognitive, psychosocial history related to current functional performance    Occupational performance deficits (Please refer to evaluation for details): ADL's;IADL's;Leisure;Social Participation    Body Structure / Function / Physical Skills ADL;Balance;Mobility;Strength;Tone;UE functional use;Flexibility;FMC;Coordination;Decreased knowledge of precautions;GMC;ROM;Decreased knowledge of use of DME;Dexterity;IADL    Rehab Potential Good    Clinical Decision Making Limited treatment options, no task modification necessary    Comorbidities Affecting Occupational Performance: May have comorbidities impacting occupational performance    Modification or Assistance to Complete Evaluation  No modification of tasks or assist necessary to complete eval    OT Frequency 2x / week   may d/c after 4-5 weeks dep on progress   OT Duration 12 weeks   plan written for 12 weeks as pt is going on vacation for a week, pt may need to resume following vactions   OT Treatment/Interventions Self-care/ADL training;Therapeutic exercise;Functional Mobility Training;Balance training;Manual Therapy;Neuromuscular education;Ultrasound;Aquatic Therapy;Energy conservation;Therapeutic activities;DME and/or AE instruction;Paraffin;Cryotherapy;Fluidtherapy;Gait Training;Moist Heat;Contrast Bath;Passive range of motion;Patient/family education    Plan bean bag toss with big movements, continue to address RUE positioning to minimize shoulder pain(avoid internal rotation), start checking long term goals, anticipate d/c next week    Recommended Other Services Pt is only scheduled for 4 weeks as he goes on vacation after  4 weeks    Consulted and Agree with Plan of Care Patient           Patient will benefit from skilled therapeutic intervention in order to improve the following deficits and impairments:   Body Structure / Function / Physical Skills: ADL, Balance, Mobility, Strength, Tone, UE functional use, Flexibility, FMC, Coordination, Decreased knowledge of precautions, GMC, ROM, Decreased knowledge of use of DME, Dexterity, IADL       Visit Diagnosis: Other lack of coordination  Other abnormalities of gait and mobility  Muscle weakness (generalized)  Abnormal posture  Other symptoms and signs involving the nervous system    Problem List Patient Active Problem List   Diagnosis Date Noted  . S/P TKR (total knee replacement), right 05/05/2019  . Osteoarthritis of right knee 05/02/2019  . Prostate cancer ([place]) 03/22/2018  . Malignant neoplasm of prostate ([place]) 02/27/2018    Cathyrn Deas 01/21/2020, 2:38 PM  [facility] [address] [place], [place], [address] Phone: [phone]   Fax:  [phone]  Name: Matthew [name] MRN: [id number] Date of Birth: Aug 06, 1950

## 2020-01-21 NOTE — Therapy (Signed)
[facility] [address] [place], [place], [address] Phone: [phone]   Fax:  [phone]  Physical Therapy Treatment  Patient Details  Name: Matthew [name] MRN: [id number] Date of Birth: 01/12/1951 Referring Provider (PT): Star Age   Encounter Date: 01/21/2020   PT End of Session - 01/21/20 1605    Visit Number 13    Number of Visits 17    Date for PT Re-Evaluation 01/28/20    Authorization Type [employer]    Progress Note Due on Visit 10    PT Start Time 1020    PT Stop Time 1100    PT Time Calculation (min) 40 min    Activity Tolerance Patient tolerated treatment well    Behavior During Therapy [facility] for tasks assessed/performed           Past Medical History:  Diagnosis Date  . Anxiety   . Arthritis   . GERD (gastroesophageal reflux disease)    occ remote history  . Hypertension   . Prostate cancer ([place])   . Right knee pain    questionable meniscal tear  . Seasonal allergies     Past Surgical History:  Procedure Laterality Date  . COLONOSCOPY    . Leg Laceration Repair  Left    Age 69  . PELVIC LYMPH NODE DISSECTION Bilateral 03/22/2018   Procedure: PELVIC LYMPH NODE DISSECTION;  Surgeon: Ceasar Mons, MD;  Location: [facility];  Service: Urology;  Laterality: Bilateral;  . PROSTATE BIOPSY    . ROBOT ASSISTED LAPAROSCOPIC RADICAL PROSTATECTOMY N/A 03/22/2018   Procedure: XI ROBOTIC ASSISTED LAPAROSCOPIC PROSTATECTOMY;  Surgeon: Ceasar Mons, MD;  Location: [facility];  Service: Urology;  Laterality: N/A;  . TOTAL KNEE ARTHROPLASTY Right 05/05/2019   Procedure: RIGHT TOTAL KNEE ARTHROPLASTY;  Surgeon: Frederik Pear, MD;  Location: [facility];  Service: Orthopedics;  Laterality: Right;    There were no vitals filed for this visit.   Subjective Assessment - 01/21/20 1559    Subjective Tried swimming at [facility] previously and was unable.  Would like to get back to swimming.   Denies falls or changes.    Patient is accompained by: --   wife   Pertinent History hx of prostate cancer/removal, incontinence, hx of R TKR 04/2019    Patient Stated Goals Pt's goal for therapy is to improve walking, to have strength and be normal again.  Wants to get back to playing guitar.    Currently in Pain? No/denies    Pain Onset More than a month ago                             [facility] Adult PT Treatment/Exercise - 01/21/20 0001      Transfers   Transfers Sit to Stand;Stand to Sit    Sit to Stand 5: Supervision    Stand to Sit 5: Supervision    Comments throughout session multiple times      Ambulation/Gait   Ambulation/Gait Yes    Ambulation/Gait Assistance 5: Supervision    Ambulation Distance (Feet) 400 Feet   x 2 and 110 x 2   Assistive device None;Other (Comment)   walking poles   Gait Pattern Step-through pattern;Decreased arm swing - right;Decreased arm swing - left;Decreased step length - right;Decreased step length - left;Right flexed knee in stance;Left flexed knee in stance;Decreased trunk rotation;Trunk flexed    Ambulation Surface Level;Indoor;Unlevel;Outdoor    Gait Comments Pt  reports noticing his foot catching when walking outdoors at home.  Some catching during outdoor gait today but did have awareness and could correct but would revert back to decreased L foot clearance.  Decreased arm swing on L.  Used walking poles indoors to encourage increased armswing and step length.  Pt with decreased cadence and sequence initially but improved with increased distance and was able to recognize when he was out of sequence.      Self-Care   Self-Care Other Self-Care Comments    Other Self-Care Comments  Pt brought pictures of gym equipment where he lives.  Has elliptical, treadmill and exercise bike.  Discussed fact that [employer] may be best option as they have a seated recumbent bike as well as a walking track.  Pt had membership through [employer]  previously and will check into this again.      Exercises   Exercises Knee/Hip      Knee/Hip Exercises: Aerobic   Stepper SciFit  BUEs/BLEs, x 8 minutes, Level 2.0, initially RPM at 68, then maintains RPM > 76 throughout.; with conversation, RPM decreases to 66; with increased attention to speed, pt able to increase to mid 80-s RPM.                    PT Short Term Goals - 12/23/19 1450      PT SHORT TERM GOAL #1   Title Pt will be indepedent with Parkinson's specific HEP to address balance, transfers, gait for improved overall mobility.  TARGET 12/26/2019 (delayed date due to delayed start-Pt wants OT, PT, speech together)    Baseline met on 12/23/19    Time 4    Period Weeks    Status Achieved    Target Date 12/26/19      PT SHORT TERM GOAL #2   Title Pt will perform at least 8 of 10 reps of sit<>stand transfers with no posterior lean or LOB.    Baseline 10 sit <> stands with no UE support without any posterior lean or LOB.    Time 4    Period Weeks    Status Achieved    Target Date 12/26/19      PT SHORT TERM GOAL #3   Title Pt will improve posterior push and release test to recover in less than 2 steps, for improved balance recovery.    Baseline 4 reps - 3 reps with single step and 1 rep with 3 steps to maintain balance.    Time 4    Period Weeks    Status Achieved    Target Date 12/26/19      PT SHORT TERM GOAL #4   Title Pt will verbalize understanding of fall prevention education in home environment.    Baseline reviewed with pt on 12/23/19 and provided handout    Time 4    Period Weeks    Status Partially Met    Target Date 12/26/19             PT Long Term Goals - 01/05/20 0958      PT LONG TERM GOAL #1   Title Pt will be independent with progression of HEP for Parkinson's related deficits.  TARGET 01/23/2020 (delayed due to delayed start for coordination of schedules)    Time 8    Period Weeks    Status New      PT LONG TERM GOAL #2   Title Pt  will improve gait velocity to at least 2.8 ft/sec  for improved gait efficiency and safety.    Baseline 2.5 ft/sec    Time 8    Period Weeks    Status New      PT LONG TERM GOAL #3   Title Pt will improve MiniBESTest score to at least 23/28 for decreased fall risk.    Baseline 24/28 on 01/05/20    Time 8    Period Weeks    Status Achieved      PT LONG TERM GOAL #4   Title Pt will improve TUG/TUG cognitive score to less than 10% difference for improved ability with dual tasking with gait.    Baseline TUG = 11.22 seconds, cog TUG = 12.9 seconds    Time 8    Period Weeks    Status On-going      PT LONG TERM GOAL #5   Title Pt/wife will verbalize understanding of local Parkinson's disease-related resources, including ongoing community fitness.    Time 8    Period Weeks    Status New                 Plan - 01/21/20 1609    Clinical Impression Statement Pt continues with decreased foot clearance on L and decreased arm swing.  Improved with use of walking poles today.  Cont per poc.    Personal Factors and Comorbidities Comorbidity 3+    Comorbidities anxiety, arthritis, GERD, prostate cancer, HTN, s/p R TKR 04/2019    Examination-Activity Limitations Locomotion Level;Transfers;Stand;Stairs    Examination-Participation Restrictions Community Activity;Other   playing with grandchildren   Stability/Clinical Decision Making Evolving/Moderate complexity    Rehab Potential Good    PT Frequency 2x / week    PT Duration 8 weeks   plus eval   PT Treatment/Interventions ADLs/Self Care Home Management;Gait training;Functional mobility training;Therapeutic activities;Therapeutic exercise;Balance training;Neuromuscular re-education;DME Instruction;Patient/family education    PT Next Visit Plan Did pt check into [employer] membership?  Gait with walking poles. SLS activities, dual tasking with high level balance and on compliant surfaces, SciFit for reciprocal movement.    PT Home Exercise Plan  standing PWR moves    Consulted and Agree with Plan of Care Patient           Patient will benefit from skilled therapeutic intervention in order to improve the following deficits and impairments:  Abnormal gait, Difficulty walking, Decreased balance, Decreased mobility, Postural dysfunction, Decreased strength, Impaired flexibility  Visit Diagnosis: Other lack of coordination  Other abnormalities of gait and mobility  Muscle weakness (generalized)  Abnormal posture  Other symptoms and signs involving the nervous system     Problem List Patient Active Problem List   Diagnosis Date Noted  . S/P TKR (total knee replacement), right 05/05/2019  . Osteoarthritis of right knee 05/02/2019  . Prostate cancer ([place]) 03/22/2018  . Malignant neoplasm of prostate ([place]) 02/27/2018    Narda Bonds, PTA [place] 01/21/20 4:11 PM Phone: [phone] Fax: [facility] [address] [place], [place], [address] Phone: [phone]   Fax:  [phone]  Name: Tramel Westbrook MRN: [id number] Date of Birth: 21-Sep-1950

## 2020-01-21 NOTE — Patient Instructions (Signed)

## 2020-01-22 ENCOUNTER — Encounter: Payer: Self-pay | Admitting: Occupational Therapy

## 2020-01-22 ENCOUNTER — Ambulatory Visit: Payer: PPO | Admitting: Physical Therapy

## 2020-01-22 ENCOUNTER — Ambulatory Visit: Payer: PPO | Admitting: Occupational Therapy

## 2020-01-22 ENCOUNTER — Encounter: Payer: Self-pay | Admitting: Physical Therapy

## 2020-01-22 DIAGNOSIS — R278 Other lack of coordination: Secondary | ICD-10-CM

## 2020-01-22 DIAGNOSIS — R2681 Unsteadiness on feet: Secondary | ICD-10-CM

## 2020-01-22 DIAGNOSIS — R2689 Other abnormalities of gait and mobility: Secondary | ICD-10-CM

## 2020-01-22 DIAGNOSIS — M6281 Muscle weakness (generalized): Secondary | ICD-10-CM

## 2020-01-22 DIAGNOSIS — R29818 Other symptoms and signs involving the nervous system: Secondary | ICD-10-CM

## 2020-01-22 DIAGNOSIS — R293 Abnormal posture: Secondary | ICD-10-CM

## 2020-01-22 NOTE — Therapy (Signed)
[facility] [address] [place], [place], [address] Phone: [phone]   Fax:  [phone]  Occupational Therapy Treatment  Patient Details  Name: Matthew [name] MRN: [id number] Date of Birth: [birth year] Referring Provider (OT): Dr. Rexene Alberts   Encounter Date: 01/22/2020   OT End of Session - 01/22/20 1106    Visit Number 11    Number of Visits 25    Date for OT Re-Evaluation 03/02/20    Authorization Type [employer]    Authorization Time Period POC written for 12 weeks , pt goes on vacation after 4 weeks and may need to resume after pt returns- may d/c after 4-8 weeks though    Authorization - Visit Number 11    Authorization - Number of Visits 20    OT Start Time 1106    OT Stop Time 1145    OT Time Calculation (min) 39 min    Activity Tolerance Patient tolerated treatment well    Behavior During Therapy [facility] for tasks assessed/performed           Past Medical History:  Diagnosis Date  . Anxiety   . Arthritis   . GERD (gastroesophageal reflux disease)    occ remote history  . Hypertension   . Prostate cancer ([place])   . Right knee pain    questionable meniscal tear  . Seasonal allergies     Past Surgical History:  Procedure Laterality Date  . COLONOSCOPY    . Leg Laceration Repair  Left    Age 69  . PELVIC LYMPH NODE DISSECTION Bilateral 03/22/2018   Procedure: PELVIC LYMPH NODE DISSECTION;  Surgeon: Ceasar Mons, MD;  [facility];  Service: Urology;  Laterality: Bilateral;  . PROSTATE BIOPSY    . ROBOT ASSISTED LAPAROSCOPIC RADICAL PROSTATECTOMY N/A 03/22/2018   Procedure: XI ROBOTIC ASSISTED LAPAROSCOPIC PROSTATECTOMY;  Surgeon: Ceasar Mons, MD;  [facility];  Service: Urology;  Laterality: N/A;  . TOTAL KNEE ARTHROPLASTY Right 05/05/2019   Procedure: RIGHT TOTAL KNEE ARTHROPLASTY;  Surgeon: Frederik Pear, MD;  [facility];  Service: Orthopedics;  Laterality:  Right;    There were no vitals filed for this visit.   Subjective Assessment - 01/22/20 1104    Subjective  Pt reports intermittant RUE pain.  [place] [address] out of bed (with dreaming)    Pertinent History PMH:  PD newly diagnosed , anxiety, arthritis, GERD, prostate cancer, HTN, s/p R TKR 04/2019    Limitations hx of CA, use caution with modalities.    Patient Stated Goals to play guitar again    Currently in Pain? Yes    Pain Score 4     Pain Location Shoulder    Pain Orientation Right    Pain Descriptors / Indicators Aching    Pain Onset More than a month ago    Pain Frequency Intermittent    Aggravating Factors  unknown    Pain Relieving Factors repositioning                Pt instructed in strategies for eating including:  Cutting with large amplitude and strategy for improved grasp on knife, positioning fork perpendicular to pierce food, scooping with incr wrist movement and hold middle of utensil.  Pt verbalized understanding  PWR! Up in supine for improved shoulder positioning/posture with min cueing.  Supine, closed-chain shoulder flexion and abduction with foam noodle, BUEs with min cueing for incr movement amplitude and positioning.  Supine>sitting transfer with min-mod cueing for  large amplitude movement strategies (roll) after education provided.  Reinforced proper positioning of RUE with functional reach with focus on posture, thumb up to decr IR with min cueing.  Standing, tossing beanbags to simulate playing horseshoes with friends with emphasis/cueing for large amplitude movement strategies.  Pt appeared to demo incr balance and accuracy with starting big with feet apart.  Pt verbalized understanding/returned demo.   Pt also instructed in large amplitude movement strategies to pick up beanbags/objects from floor (feet apart/staggared).  Pt returned demo with improved balance and verbalized understanding.        OT Education - 01/22/20 1110    Education Details  Possibility of using bed rail due to falling out of bed due to vivid dream last night (has happened 1 other thime)    Person(s) Educated Patient    Methods Explanation    Comprehension Verbalized understanding            OT Short Term Goals - 12/23/19 1408      OT SHORT TERM GOAL #1   Title I with PD specific HEP    Time 4    Period Weeks    Status Achieved    Target Date 01/02/20      OT SHORT TERM GOAL #2   Title Pt will verbalize understanding of adapted strategies to maximize safety and I with ADLs/ IADLs .    Time 4    Period Weeks    Status Achieved    Target Date 01/02/20      OT SHORT TERM GOAL #3   Title Pt will  demonstrate improved LUE functional use for ADLs as evidenced by increasing box/ blocks score by 4 blocks with RUE    Baseline 41    Time 4    Period Weeks    Status Achieved   45     OT SHORT TERM GOAL #4   Title Pt will demonstrate increased ease with dressing as evidenced by decreasing PPT#4(don/ doff jacket) to 25 secs or less    Baseline 29.78    Time 4    Period Weeks    Status Achieved   10.60 secs     OT SHORT TERM GOAL #5   Title Pt will retrieve a lightweight object from overhead shelf with -15 elbow extension in RUE    Time 4    Period Weeks    Status Achieved   -5 elbow ext            OT Long Term Goals - 01/21/20 1437      OT LONG TERM GOAL #1   Title Pt will verbalize understanding of ways to prevent future PD related complications and PD community resources.    Time 12    Period Weeks    Status Achieved   education provided, pt verbalizes understanding.     OT LONG TERM GOAL #2   Title Pt will demonstrate improved fine motor coordination for ADLs as evidenced by decreasing 9 hole peg test score for RUE by 3 secs    Time 12    Period Weeks    Status On-going      OT LONG TERM GOAL #3   Title Pt will demonstrate improved ease with dressing as eveidenced by decreasing 3 button/ unbutton time to 30 secs or less    Baseline  34.68    Time 12    Period Weeks    Status On-going      OT LONG TERM GOAL #  4   Title Pt will report increased ease with playing his guitar    Time 12    Period Weeks    Status On-going      OT LONG TERM GOAL #5   Title Pt will report pain in left shoulder no greater than 2/10 for ADLs.    Time 12    Period Weeks    Status On-going                 Plan - 01/22/20 1112    OT Occupational Profile and History Detailed Assessment- Review of Records and additional review of physical, cognitive, psychosocial history related to current functional performance    Occupational performance deficits (Please refer to evaluation for details): ADL's;IADL's;Leisure;Social Participation    Body Structure / Function / Physical Skills ADL;Balance;Mobility;Strength;Tone;UE functional use;Flexibility;FMC;Coordination;Decreased knowledge of precautions;GMC;ROM;Decreased knowledge of use of DME;Dexterity;IADL    Rehab Potential Good    Clinical Decision Making Limited treatment options, no task modification necessary    Comorbidities Affecting Occupational Performance: May have comorbidities impacting occupational performance    Modification or Assistance to Complete Evaluation  No modification of tasks or assist necessary to complete eval    OT Frequency 2x / week   may d/c after 4-5 weeks dep on progress   OT Duration 12 weeks   plan written for 12 weeks as pt is going on vacation for a week, pt may need to resume following vactions   OT Treatment/Interventions Self-care/ADL training;Therapeutic exercise;Functional Mobility Training;Balance training;Manual Therapy;Neuromuscular education;Ultrasound;Aquatic Therapy;Energy conservation;Therapeutic activities;DME and/or AE instruction;Paraffin;Cryotherapy;Fluidtherapy;Gait Training;Moist Heat;Contrast Bath;Passive range of motion;Patient/family education    Plan bean bag toss with big movements, continue to address RUE positioning to minimize shoulder  pain(avoid internal rotation), start checking long term goals, anticipate d/c next week    Recommended Other Services Pt is only scheduled for 4 weeks as he goes on vacation after 4 weeks    Consulted and Agree with Plan of Care Patient           Patient will benefit from skilled therapeutic intervention in order to improve the following deficits and impairments:   Body Structure / Function / Physical Skills: ADL, Balance, Mobility, Strength, Tone, UE functional use, Flexibility, FMC, Coordination, Decreased knowledge of precautions, GMC, ROM, Decreased knowledge of use of DME, Dexterity, IADL       Visit Diagnosis: Other symptoms and signs involving the nervous system  Abnormal posture  Other lack of coordination  Unsteadiness on feet    Problem List Patient Active Problem List   Diagnosis Date Noted  . S/P TKR (total knee replacement), right 05/05/2019  . Osteoarthritis of right knee 05/02/2019  . Prostate cancer ([place]) 03/22/2018  . Malignant neoplasm of prostate ([place]) 02/27/2018    [facility] 01/22/2020, 11:22 AM  [facility] [address] [place], [place], [address] Phone: [phone]   Fax:  [phone]  Name: Matthew [name] MRN: [id number] Date of Birth: [birth year]   Vianne Bulls, OTR/L [facility] [address] [place], [place]  [address] [phone] phone [phone] 01/22/20 12:05 PM

## 2020-01-22 NOTE — Therapy (Signed)
[facility] [address] [place], [place], [address] Phone: [phone]   Fax:  [phone]  Physical Therapy Treatment  Patient Details  Name: Matthew [name] MRN: [id number] Date of Birth: 05/13/1951 Referring Provider (PT): Star Age   Encounter Date: 01/22/2020   PT End of Session - 01/22/20 1151    Visit Number 14    Number of Visits 17    Date for PT Re-Evaluation 01/28/20    Authorization Type [employer]    Progress Note Due on Visit 10    PT Start Time 1020    PT Stop Time 1100    PT Time Calculation (min) 40 min    Activity Tolerance Patient tolerated treatment well    Behavior During Therapy [facility] for tasks assessed/performed           Past Medical History:  Diagnosis Date  . Anxiety   . Arthritis   . GERD (gastroesophageal reflux disease)    occ remote history  . Hypertension   . Prostate cancer ([place])   . Right knee pain    questionable meniscal tear  . Seasonal allergies     Past Surgical History:  Procedure Laterality Date  . COLONOSCOPY    . Leg Laceration Repair  Left    Age 48  . PELVIC LYMPH NODE DISSECTION Bilateral 03/22/2018   Procedure: PELVIC LYMPH NODE DISSECTION;  Surgeon: Ceasar Mons, MD;  Location: [facility];  Service: Urology;  Laterality: Bilateral;  . PROSTATE BIOPSY    . ROBOT ASSISTED LAPAROSCOPIC RADICAL PROSTATECTOMY N/A 03/22/2018   Procedure: XI ROBOTIC ASSISTED LAPAROSCOPIC PROSTATECTOMY;  Surgeon: Ceasar Mons, MD;  Location: [facility];  Service: Urology;  Laterality: N/A;  . TOTAL KNEE ARTHROPLASTY Right 05/05/2019   Procedure: RIGHT TOTAL KNEE ARTHROPLASTY;  Surgeon: Frederik Pear, MD;  Location: [facility];  Service: Orthopedics;  Laterality: Right;    There were no vitals filed for this visit.   Subjective Assessment - 01/22/20 1024    Subjective Had a fall out of the bed last night.  Was having a nightmare and made him fall out of  bed.    Patient is accompained by: --   wife   Pertinent History hx of prostate cancer/removal, incontinence, hx of R TKR 04/2019    Patient Stated Goals Pt's goal for therapy is to improve walking, to have strength and be normal again.  Wants to get back to playing guitar.    Currently in Pain? No/denies                             [facility] Adult PT Treatment/Exercise - 01/22/20 0001      Transfers   Transfers Sit to Stand;Stand to Sit    Sit to Stand 5: Supervision    Stand to Sit 5: Supervision      Ambulation/Gait   Ambulation/Gait Yes    Ambulation/Gait Assistance 5: Supervision    Ambulation Distance (Feet) 360 Feet   indoors and >1000 outdoors   Assistive device None;Other (Comment)   bil walking poles   Gait Pattern Step-through pattern;Decreased arm swing - right;Decreased arm swing - left;Decreased step length - right;Decreased step length - left;Right flexed knee in stance;Left flexed knee in stance;Decreased trunk rotation;Trunk flexed    Ambulation Surface Level;Unlevel;Indoor;Outdoor;Paved    Gait Comments Gait training with walking poles working on sequence, rhythm, increased cadence and multi tasking with conversating while walking.  Pt  improved gait pattern with practice and seemed to help when he maintained a "musical drum beat" in his head.  Discussed using music tempo for tasks with PD.        Posture/Postural Control   Posture/Postural Control Postural limitations    Postural Limitations Rounded Shoulders;Forward head;Posterior pelvic tilt;Flexed trunk      Exercises   Exercises Knee/Hip      Knee/Hip Exercises: Aerobic   Stepper SciFit  BUEs/BLEs, x 9 minutes, Level 2.0, initially RPM at 71, then maintains RPM > 76 throughout.; with conversation, RPM decreases to 66; with increased attention to speed, pt able to increase to mid 80-s RPM.                    PT Short Term Goals - 12/23/19 1450      PT SHORT TERM GOAL #1   Title Pt  will be indepedent with Parkinson's specific HEP to address balance, transfers, gait for improved overall mobility.  TARGET 12/26/2019 (delayed date due to delayed start-Pt wants OT, PT, speech together)    Baseline met on 12/23/19    Time 4    Period Weeks    Status Achieved    Target Date 12/26/19      PT SHORT TERM GOAL #2   Title Pt will perform at least 8 of 10 reps of sit<>stand transfers with no posterior lean or LOB.    Baseline 10 sit <> stands with no UE support without any posterior lean or LOB.    Time 4    Period Weeks    Status Achieved    Target Date 12/26/19      PT SHORT TERM GOAL #3   Title Pt will improve posterior push and release test to recover in less than 2 steps, for improved balance recovery.    Baseline 4 reps - 3 reps with single step and 1 rep with 3 steps to maintain balance.    Time 4    Period Weeks    Status Achieved    Target Date 12/26/19      PT SHORT TERM GOAL #4   Title Pt will verbalize understanding of fall prevention education in home environment.    Baseline reviewed with pt on 12/23/19 and provided handout    Time 4    Period Weeks    Status Partially Met    Target Date 12/26/19             PT Long Term Goals - 01/05/20 0958      PT LONG TERM GOAL #1   Title Pt will be independent with progression of HEP for Parkinson's related deficits.  TARGET 01/23/2020 (delayed due to delayed start for coordination of schedules)    Time 8    Period Weeks    Status New      PT LONG TERM GOAL #2   Title Pt will improve gait velocity to at least 2.8 ft/sec for improved gait efficiency and safety.    Baseline 2.5 ft/sec    Time 8    Period Weeks    Status New      PT LONG TERM GOAL #3   Title Pt will improve MiniBESTest score to at least 23/28 for decreased fall risk.    Baseline 24/28 on 01/05/20    Time 8    Period Weeks    Status Achieved      PT LONG TERM GOAL #4   Title Pt will improve TUG/TUG cognitive  score to less than 10% difference  for improved ability with dual tasking with gait.    Baseline TUG = 11.22 seconds, cog TUG = 12.9 seconds    Time 8    Period Weeks    Status On-going      PT LONG TERM GOAL #5   Title Pt/wife will verbalize understanding of local Parkinson's disease-related resources, including ongoing community fitness.    Time 8    Period Weeks    Status New                 Plan - 01/22/20 1153    Clinical Impression Statement Pt with improved gait with walking poles with practice today.  Pt still needs to look into joining [employer].  Cont per poc.    Personal Factors and Comorbidities Comorbidity 3+    Comorbidities anxiety, arthritis, GERD, prostate cancer, HTN, s/p R TKR 04/2019    Examination-Activity Limitations Locomotion Level;Transfers;Stand;Stairs    Examination-Participation Restrictions Community Activity;Other   playing with grandchildren   Stability/Clinical Decision Making Evolving/Moderate complexity    Rehab Potential Good    PT Frequency 2x / week    PT Duration 8 weeks   plus eval   PT Treatment/Interventions ADLs/Self Care Home Management;Gait training;Functional mobility training;Therapeutic activities;Therapeutic exercise;Balance training;Neuromuscular re-education;DME Instruction;Patient/family education    PT Next Visit Plan Did pt check into [employer] membership?  Gait with walking poles. SLS activities, dual tasking with high level balance and on compliant surfaces, SciFit for reciprocal movement.    PT Home Exercise Plan standing PWR moves    Consulted and Agree with Plan of Care Patient           Patient will benefit from skilled therapeutic intervention in order to improve the following deficits and impairments:  Abnormal gait, Difficulty walking, Decreased balance, Decreased mobility, Postural dysfunction, Decreased strength, Impaired flexibility  Visit Diagnosis: Other lack of coordination  Other abnormalities of gait and mobility  Muscle weakness  (generalized)  Abnormal posture  Other symptoms and signs involving the nervous system     Problem List Patient Active Problem List   Diagnosis Date Noted  . S/P TKR (total knee replacement), right 05/05/2019  . Osteoarthritis of right knee 05/02/2019  . Prostate cancer ([place]) 03/22/2018  . Malignant neoplasm of prostate ([place]) 02/27/2018    Narda Bonds, PTA [facility] 01/22/20 11:56 AM Phone: [phone] Fax: [facility] [address] [place], [place], [address] Phone: [phone]   Fax:  [phone]  Name: Matthew [name] MRN: [id number] Date of Birth: 02/01/1951

## 2020-01-28 ENCOUNTER — Other Ambulatory Visit: Payer: Self-pay

## 2020-01-28 ENCOUNTER — Ambulatory Visit: Payer: PPO | Admitting: Physical Therapy

## 2020-01-28 ENCOUNTER — Ambulatory Visit: Payer: PPO | Admitting: Occupational Therapy

## 2020-01-28 ENCOUNTER — Encounter: Payer: Self-pay | Admitting: Occupational Therapy

## 2020-01-28 DIAGNOSIS — R278 Other lack of coordination: Secondary | ICD-10-CM

## 2020-01-28 DIAGNOSIS — R293 Abnormal posture: Secondary | ICD-10-CM

## 2020-01-28 DIAGNOSIS — R2689 Other abnormalities of gait and mobility: Secondary | ICD-10-CM

## 2020-01-28 DIAGNOSIS — R29818 Other symptoms and signs involving the nervous system: Secondary | ICD-10-CM

## 2020-01-28 DIAGNOSIS — M6281 Muscle weakness (generalized): Secondary | ICD-10-CM

## 2020-01-28 NOTE — Therapy (Addendum)
[facility] [address] [place], [place], [address] Phone: [phone]   Fax:  [phone]  Occupational Therapy Treatment  Patient Details  Name: Matthew [name] MRN: [id number] Date of Birth: [birth year] Referring Provider (OT): Dr. Rexene Alberts   Encounter Date: 01/28/2020   OT End of Session - 01/28/20 1138    Visit Number 12    Number of Visits 25    Date for OT Re-Evaluation 03/02/20    Authorization Type [employer]    Authorization Time Period POC written for 12 weeks , pt goes on vacation after 4 weeks and may need to resume after pt returns- may d/c after 4-8 weeks though    Authorization - Visit Number 12    Authorization - Number of Visits 20    OT Start Time 1103    OT Stop Time 1141    OT Time Calculation (min) 38 min    Activity Tolerance Patient tolerated treatment well    Behavior During Therapy [facility] for tasks assessed/performed           Past Medical History:  Diagnosis Date  . Anxiety   . Arthritis   . GERD (gastroesophageal reflux disease)    occ remote history  . Hypertension   . Prostate cancer ([place])   . Right knee pain    questionable meniscal tear  . Seasonal allergies     Past Surgical History:  Procedure Laterality Date  . COLONOSCOPY    . Leg Laceration Repair  Left    Age 69  . PELVIC LYMPH NODE DISSECTION Bilateral 03/22/2018   Procedure: PELVIC LYMPH NODE DISSECTION;  Surgeon: Ceasar Mons, MD;  [facility];  Service: Urology;  Laterality: Bilateral;  . PROSTATE BIOPSY    . ROBOT ASSISTED LAPAROSCOPIC RADICAL PROSTATECTOMY N/A 03/22/2018   Procedure: XI ROBOTIC ASSISTED LAPAROSCOPIC PROSTATECTOMY;  Surgeon: Ceasar Mons, MD;  [facility]: [facility];  Service: Urology;  Laterality: N/A;  . TOTAL KNEE ARTHROPLASTY Right 05/05/2019   Procedure: RIGHT TOTAL KNEE ARTHROPLASTY;  Surgeon: Frederik Pear, MD;  [facility];  Service: Orthopedics;  Laterality:  Right;    There were no vitals filed for this visit.   Subjective Assessment - 01/28/20 1113    Subjective  Denies pain    Pertinent History PMH:  PD newly diagnosed , anxiety, arthritis, GERD, prostate cancer, HTN, s/p R TKR 04/2019    Limitations hx of CA, use caution with modalities.    Patient Stated Goals to play guitar again    Currently in Pain? No/denies                    Treatment: Supine PWR! Up x 10 reps then closed chain chest press and shoulder flexion supine, min v.c Dynamic step and reach to toss bean bags to target, min v.c for wider stance/ feet apart and larger amplitude movements. Bag exercises for simulated donning shirt, pants and socks, min v.c Pt was instructed in use of sock aide, he returned demonstration. Arm bike x 5 mins level 1 for conditioning Therapist checked progress towards goals.               OT Short Term Goals - 12/23/19 1408      OT SHORT TERM GOAL #1   Title I with PD specific HEP    Time 4    Period Weeks    Status Achieved    Target Date 01/02/20      OT  SHORT TERM GOAL #2   Title Pt will verbalize understanding of adapted strategies to maximize safety and I with ADLs/ IADLs .    Time 4    Period Weeks    Status Achieved    Target Date 01/02/20      OT SHORT TERM GOAL #3   Title Pt will  demonstrate improved LUE functional use for ADLs as evidenced by increasing box/ blocks score by 4 blocks with RUE    Baseline 41    Time 4    Period Weeks    Status Achieved   45     OT SHORT TERM GOAL #4   Title Pt will demonstrate increased ease with dressing as evidenced by decreasing PPT#4(don/ doff jacket) to 25 secs or less    Baseline 29.78    Time 4    Period Weeks    Status Achieved   10.60 secs     OT SHORT TERM GOAL #5   Title Pt will retrieve a lightweight object from overhead shelf with -15 elbow extension in RUE    Time 4    Period Weeks    Status Achieved   -5 elbow ext            OT Long  Term Goals - 01/28/20 1114      OT LONG TERM GOAL #1   Title Pt will verbalize understanding of ways to prevent future PD related complications and PD community resources.    Time 12    Period Weeks    Status Achieved   education provided, pt verbalizes understanding.     OT LONG TERM GOAL #2   Title Pt will demonstrate improved fine motor coordination for ADLs as evidenced by decreasing 9 hole peg test score for RUE by 3 secs    Time 12    Period Weeks    Status Partially Met   36.12, 29.10, met 1/2 trials     OT LONG TERM GOAL #3   Title Pt will demonstrate improved ease with dressing as eveidenced by decreasing 3 button/ unbutton time to 30 secs or less    Baseline 34.68    Time 12    Period Weeks    Status Achieved   24.25 secs     OT LONG TERM GOAL #4   Title Pt will report increased ease with playing his guitar    Time 12    Period Weeks    Status Not Met      OT LONG TERM GOAL #5   Title Pt will report pain in left shoulder no greater than 2/10 for ADLs.    Time 12    Period Weeks    Status Achieved                 Plan - 01/28/20 1146    Clinical Impression Statement Pt agrees with plans for d/c. Pt made good overall progress.    OT Occupational Profile and History Detailed Assessment- Review of Records and additional review of physical, cognitive, psychosocial history related to current functional performance    Occupational performance deficits (Please refer to evaluation for details): ADL's;IADL's;Leisure;Social Participation    Body Structure / Function / Physical Skills ADL;Balance;Mobility;Strength;Tone;UE functional use;Flexibility;FMC;Coordination;Decreased knowledge of precautions;GMC;ROM;Decreased knowledge of use of DME;Dexterity;IADL    Rehab Potential Good    Clinical Decision Making Limited treatment options, no task modification necessary    Comorbidities Affecting Occupational Performance: May have comorbidities impacting occupational  performance  Modification or Assistance to Complete Evaluation  No modification of tasks or assist necessary to complete eval    OT Frequency 2x / week   may d/c after 4-5 weeks dep on progress   OT Duration 12 weeks   plan written for 12 weeks as pt is going on vacation for a week, pt may need to resume following vactions   OT Treatment/Interventions Self-care/ADL training;Therapeutic exercise;Functional Mobility Training;Balance training;Manual Therapy;Neuromuscular education;Ultrasound;Aquatic Therapy;Energy conservation;Therapeutic activities;DME and/or AE instruction;Paraffin;Cryotherapy;Fluidtherapy;Gait Training;Moist Heat;Contrast Bath;Passive range of motion;Patient/family education    Plan d/c OT    Recommended Other Services Pt is only scheduled for 4 weeks as he goes on vacation after 4 weeks    Consulted and Agree with Plan of Care Patient           Patient will benefit from skilled therapeutic intervention in order to improve the following deficits and impairments:   Body Structure / Function / Physical Skills: ADL, Balance, Mobility, Strength, Tone, UE functional use, Flexibility, FMC, Coordination, Decreased knowledge of precautions, GMC, ROM, Decreased knowledge of use of DME, Dexterity, IADL       Visit Diagnosis: Other lack of coordination  Other abnormalities of gait and mobility  Muscle weakness (generalized)  Abnormal posture  Other symptoms and signs involving the nervous system   OCCUPATIONAL THERAPY DISCHARGE SUMMARY   Current functional level related to goals / functional outcomes: Pt demonstrates good overall progress. See goals for progress.   Remaining deficits: Rigidity, decreased coordination, decreased balance, decreased functional mobility   Education / Equipment: Pt was educated regarding the following: HEP, ways to prevent future complications, community resources. Pt demonstrates understanding of all education.  Plan: Patient agrees to  discharge.  Patient goals were partially met. Patient is being discharged due to being pleased with the current functional level.  ?????      Problem List Patient Active Problem List   Diagnosis Date Noted  . S/P TKR (total knee replacement), right 05/05/2019  . Osteoarthritis of right knee 05/02/2019  . Prostate cancer ([place]) 03/22/2018  . Malignant neoplasm of prostate ([place]) 02/27/2018    Matthew [name] 01/28/2020, 11:47 AM Theone Murdoch, OTR/L [phone] Phone: [phone] 11:47 AM 01/28/20 [facility] [address] [place], [place], [address] Phone: [phone]   Fax:  [phone]  Name: Matthew [name] MRN: [id number] Date of Birth: [birth year]

## 2020-01-28 NOTE — Therapy (Signed)
[facility] [address] [place], [place], [address] Phone: [phone]   Fax:  [phone]  Physical Therapy Treatment/Discharge Summary  Patient Details  Name: Matthew [name] MRN: [id number] Date of Birth: Mar 15, 1951 Referring Provider (PT): Star Age   Encounter Date: 01/28/2020   PT End of Session - 01/28/20 1116    Visit Number 15    Number of Visits 17    Date for PT Re-Evaluation 01/28/20    Authorization Type [employer]    Progress Note Due on Visit 10    PT Start Time 1021   pt arrived late   PT Stop Time 1059    PT Time Calculation (min) 38 min    Activity Tolerance Patient tolerated treatment well    Behavior During Therapy [facility] for tasks assessed/performed           Past Medical History:  Diagnosis Date  . Anxiety   . Arthritis   . GERD (gastroesophageal reflux disease)    occ remote history  . Hypertension   . Prostate cancer ([place])   . Right knee pain    questionable meniscal tear  . Seasonal allergies     Past Surgical History:  Procedure Laterality Date  . COLONOSCOPY    . Leg Laceration Repair  Left    Age 69  . PELVIC LYMPH NODE DISSECTION Bilateral 03/22/2018   Procedure: PELVIC LYMPH NODE DISSECTION;  Surgeon: Ceasar Mons, MD;  Location: [facility];  Service: Urology;  Laterality: Bilateral;  . PROSTATE BIOPSY    . ROBOT ASSISTED LAPAROSCOPIC RADICAL PROSTATECTOMY N/A 03/22/2018   Procedure: XI ROBOTIC ASSISTED LAPAROSCOPIC PROSTATECTOMY;  Surgeon: Ceasar Mons, MD;  Location: [facility];  Service: Urology;  Laterality: N/A;  . TOTAL KNEE ARTHROPLASTY Right 05/05/2019   Procedure: RIGHT TOTAL KNEE ARTHROPLASTY;  Surgeon: Frederik Pear, MD;  Location: [facility];  Service: Orthopedics;  Laterality: Right;    There were no vitals filed for this visit.   Subjective Assessment - 01/28/20 1022    Subjective No changes, no falls. Has not yet been able to check  into the [facility].    Patient is accompained by: --   wife   Pertinent History hx of prostate cancer/removal, incontinence, hx of R TKR 04/2019    Patient Stated Goals Pt's goal for therapy is to improve walking, to have strength and be normal again.  Wants to get back to playing guitar.    Currently in Pain? No/denies              [facility] PT Assessment - 01/28/20 1031      Timed Up and Go Test   Normal TUG (seconds) 9.88    Cognitive TUG (seconds) 10.6                         [facility] Adult PT Treatment/Exercise - 01/28/20 1031      Transfers   Transfers Sit to Stand;Stand to Sit    Sit to Stand 6: Modified independent (Device/Increase time)    Five time sit to stand comments  10.5 seconds no UE support from standard height chair    Stand to Sit 6: Modified independent (Device/Increase time)      Ambulation/Gait   Ambulation/Gait Yes    Ambulation/Gait Assistance 6: Modified independent (Device/Increase time)    Gait Pattern Step-through pattern;Decreased arm swing - right;Decreased arm swing - left;Decreased step length - right;Decreased step length - left;Right flexed knee  in stance;Left flexed knee in stance;Decreased trunk rotation;Trunk flexed    Ambulation Surface Level;Indoor    Gait velocity 11.06 seconds = 2.96 ft/sec      Therapeutic Activites    Therapeutic Activities Other Therapeutic Activities    Other Therapeutic Activities discussed community resources with pt and plan for community fitness, pt with no questions about resources and states that he will be joining the [facility]. discussed PD screens and for pt to return in 6-9 months for all 3 disciplines with pt in agreement, showed pt where to purchase B walking poles (as pt has been using in previous 2 sessions and may interested in purchasing for longer distances outdoors)           Reviewed standing PWR! Moves, and forward step/backwards step weight shift for HEP. Pt reporting performing exercises daily.     Performed Standing PWR! Moves Flow x5 reps Up > [name] > Twist > Step        PT Education - 01/28/20 1116    Education Details reviewed pt's HEP, discharge from PT - scheduling for PD screens for all 3 disciplines in 6-9 months.    Person(s) Educated Patient    Methods Explanation    Comprehension Verbalized understanding;Returned demonstration            PT Short Term Goals - 12/23/19 1450      PT SHORT TERM GOAL #1   Title Pt will be indepedent with Parkinson's specific HEP to address balance, transfers, gait for improved overall mobility.  TARGET 12/26/2019 (delayed date due to delayed start-Pt wants OT, PT, speech together)    Baseline met on 12/23/19    Time 4    Period Weeks    Status Achieved    Target Date 12/26/19      PT SHORT TERM GOAL #2   Title Pt will perform at least 8 of 10 reps of sit<>stand transfers with no posterior lean or LOB.    Baseline 10 sit <> stands with no UE support without any posterior lean or LOB.    Time 4    Period Weeks    Status Achieved    Target Date 12/26/19      PT SHORT TERM GOAL #3   Title Pt will improve posterior push and release test to recover in less than 2 steps, for improved balance recovery.    Baseline 4 reps - 3 reps with single step and 1 rep with 3 steps to maintain balance.    Time 4    Period Weeks    Status Achieved    Target Date 12/26/19      PT SHORT TERM GOAL #4   Title Pt will verbalize understanding of fall prevention education in home environment.    Baseline reviewed with pt on 12/23/19 and provided handout    Time 4    Period Weeks    Status Partially Met    Target Date 12/26/19             PT Long Term Goals - 01/28/20 1035      PT LONG TERM GOAL #1   Title Pt will be independent with progression of HEP for Parkinson's related deficits.  TARGET 01/23/2020 (delayed due to delayed start for coordination of schedules)    Baseline achieved on 01/28/20    Time 8    Period Weeks    Status Achieved       PT LONG TERM GOAL #2   Title Pt will  improve gait velocity to at least 2.8 ft/sec for improved gait efficiency and safety.    Baseline 2.5 ft/sec, 2.96 ft/sec on 01/28/20    Time 8    Period Weeks    Status Achieved      PT LONG TERM GOAL #3   Title Pt will improve MiniBESTest score to at least 23/28 for decreased fall risk.    Baseline 24/28 on 01/05/20    Time 8    Period Weeks    Status Achieved      PT LONG TERM GOAL #4   Title Pt will improve TUG/TUG cognitive score to less than 10% difference for improved ability with dual tasking with gait.    Baseline TUG = 9.88 seconds, cog TUG = 10.6 seconds on 01/28/20    Time 8    Period Weeks    Status Achieved      PT LONG TERM GOAL #5   Title Pt/wife will verbalize understanding of local Parkinson's disease-related resources, including ongoing community fitness.    Baseline looking into the resources, pt verbalized understanding and will be looking into joining the [employer]    Time 8    Period Weeks    Status Achieved              PHYSICAL THERAPY DISCHARGE SUMMARY  Visits from Start of Care: 15  Current functional level related to goals / functional outcomes: See LTGs.   Remaining deficits: High level balance impairments   Education / Equipment: HEP, PD resources   Plan: Patient agrees to discharge.  Patient goals were met. Patient is being discharged due to meeting the stated rehab goals.  ?????          Plan - 01/28/20 1125    Clinical Impression Statement Focus of today's skilled session was assessing pt's LTGs as cert ended today. Pt still had one more scheduled PT appt later this week, but pt in agreement to D/C due to pt meeting all 5 of his LTGs. Discussed pt returning for PD screens in 6-9 months for all 3 disciplines. Pt is pleased with his progress with PT and will be discharged at this time.    Personal Factors and Comorbidities Comorbidity 3+    Comorbidities anxiety, arthritis, GERD, prostate cancer,  HTN, s/p R TKR 04/2019    Examination-Activity Limitations Locomotion Level;Transfers;Stand;Stairs    Examination-Participation Restrictions Community Activity;Other   playing with grandchildren   Stability/Clinical Decision Making Evolving/Moderate complexity    Rehab Potential Good    PT Frequency 2x / week    PT Duration 8 weeks   plus eval   PT Treatment/Interventions ADLs/Self Care Home Management;Gait training;Functional mobility training;Therapeutic activities;Therapeutic exercise;Balance training;Neuromuscular re-education;DME Instruction;Patient/family education    PT Next Visit Plan D/C    PT Home Exercise Plan standing PWR moves    Consulted and Agree with Plan of Care Patient           Patient will benefit from skilled therapeutic intervention in order to improve the following deficits and impairments:  Abnormal gait, Difficulty walking, Decreased balance, Decreased mobility, Postural dysfunction, Decreased strength, Impaired flexibility  Visit Diagnosis: Other lack of coordination  Other abnormalities of gait and mobility  Muscle weakness (generalized)  Abnormal posture  Other symptoms and signs involving the nervous system     Problem List Patient Active Problem List   Diagnosis Date Noted  . S/P TKR (total knee replacement), right 05/05/2019  . Osteoarthritis of right knee 05/02/2019  . Prostate cancer ([place])  03/22/2018  . Malignant neoplasm of prostate ([place]) 02/27/2018    Arliss Journey, PT, DPT  01/28/2020, 11:29 AM  [facility] [address] [place], [place], [address] Phone: [phone]   Fax:  [phone]  Name: Matthew [name] MRN: [id number] Date of Birth: 1951/05/02

## 2020-01-30 ENCOUNTER — Ambulatory Visit: Payer: PPO | Admitting: Physical Therapy

## 2020-01-30 ENCOUNTER — Ambulatory Visit: Payer: PPO | Admitting: Occupational Therapy

## 2020-02-18 DIAGNOSIS — C61 Malignant neoplasm of prostate: Secondary | ICD-10-CM | POA: Diagnosis not present

## 2020-04-14 ENCOUNTER — Telehealth: Payer: Self-pay

## 2020-04-14 NOTE — Telephone Encounter (Signed)
Dr. Rexene Alberts will not be here on 04/19/2020. I called pt, left a message on his cell phone advising him of this and will call him back on Monday to reschedule.

## 2020-04-19 ENCOUNTER — Ambulatory Visit: Payer: PPO | Admitting: Neurology

## 2020-04-19 NOTE — Telephone Encounter (Signed)
I called pt. He cannot take a 7:30am appt. I will call him as appointments become available.

## 2020-04-19 NOTE — Telephone Encounter (Signed)
I called pt. I offered him an appt on 04/21/2020 at 9:00am. Pt accepted this appt. He will let me know if he cannot take this appt.

## 2020-04-20 DIAGNOSIS — G20 Parkinson's disease: Secondary | ICD-10-CM | POA: Diagnosis not present

## 2020-04-20 DIAGNOSIS — E7800 Pure hypercholesterolemia, unspecified: Secondary | ICD-10-CM | POA: Diagnosis not present

## 2020-04-20 DIAGNOSIS — C61 Malignant neoplasm of prostate: Secondary | ICD-10-CM | POA: Diagnosis not present

## 2020-04-20 DIAGNOSIS — I10 Essential (primary) hypertension: Secondary | ICD-10-CM | POA: Diagnosis not present

## 2020-04-20 DIAGNOSIS — M19049 Primary osteoarthritis, unspecified hand: Secondary | ICD-10-CM | POA: Diagnosis not present

## 2020-04-21 ENCOUNTER — Encounter: Payer: Self-pay | Admitting: Neurology

## 2020-04-21 ENCOUNTER — Ambulatory Visit: Payer: PPO | Admitting: Neurology

## 2020-04-21 VITALS — BP 107/68 | HR 69 | Ht 66.0 in | Wt 168.0 lb

## 2020-04-21 DIAGNOSIS — G4752 REM sleep behavior disorder: Secondary | ICD-10-CM

## 2020-04-21 DIAGNOSIS — G20 Parkinson's disease: Secondary | ICD-10-CM

## 2020-04-21 DIAGNOSIS — G20C Parkinsonism, unspecified: Secondary | ICD-10-CM

## 2020-04-21 DIAGNOSIS — K5909 Other constipation: Secondary | ICD-10-CM

## 2020-04-21 NOTE — Progress Notes (Signed)
Subjective:    Patient ID: Matthew [name] is a 69 y.o. male.  HPI     Interim history:   Matthew [name] is a 69 year old right-handed gentleman with an underlying medical history of hypertension, reflux disease, arthritis with status post right total knee replacement, seasonal allergies, anxiety, and prostate cancer, status post prostate surgery in November 2019 and status post XRT in 2020, on Lupron inj, who presents for Follow-up consultation of his right-sided parkinsonism.  The patient is accompanied by his wife again today. I last saw him on 01/14/2020, at which time he felt about the same.  He reported ongoing issues with urinary incontinence.  He had stopped taking melatonin for dream enactment behaviors.  He felt drowsy from it so he stopped it.  He had also stopped Requip because of sleepiness.  He was on Sinemet 1 pill 3 times daily without any telltale response noted.  He was advised to increase it gradually to 2 pills 3 times daily.  Today, 04/21/2020: He reports doing better on the higher dose of levodopa.  He is able to tolerate it.  He takes 2 pills 3 times a day.  He feels that his tremor in his mobility are a little bit better.  His wife agrees.  He has ongoing issues with intermittent constipation, takes Colace regularly and Metamucil as needed.  They also have MiraLAX if needed.  He tries to stay active, has been involved in a Zumba dance class for Parkinson's patients.  He likes to read which keeps him mentally active.  He is without any acute complaints.  He has had some active dreams and one time he woke up after having a vivid dream and he was standing in the middle of the room.  He had not fallen thankfully.  He does take melatonin on a nightly basis.  He is trying to keep a more set schedule for his sleep and rise time.  He is following the exercise instructions he was given by therapy.  He has reevaluations pending for 08/10/2020 with PT, OT and ST through neuro rehab.   The  patient's allergies, current medications, family history, past medical history, past social history, past surgical history and problem list were reviewed and updated as appropriate.    Previously:    I saw him on 10/14/2019, at which time he was off the ropinirole.  He had side effects and these improved as he tapered off of it.  He had not had any recent falls but he did have a history of dream enactment behaviors and he was advised to start melatonin at night for sleep.  In addition, I suggested a trial of low-dose Sinemet generic with gradual increase.  I also advised him to seek evaluations through neuro rehab.  I made a referral to outpatient PT, OT and speech therapy.   I first met him on 08/05/2019 at the request of his primary care physician, at which time the patient reported an approximately 2 or 2+ year history of right-sided fine motor dyscontrol particularly affect is in his right hand when he was playing the guitar.  His examination was in keeping with parkinsonism.  I suggested we consider medication in the form of a dopamine agonist.  He was advised to proceed with a DaTscan. He called in the interim on 08/20/2019, and was ready to start ropinirole with gradual titration. He had a DaTscan on 10/02/2019 and I reviewed the results: IMPRESSION: Decreased radiotracer accumulation within the LEFT and RIGHT striatum with  faint retention of within the heads of the caudate nuclei. The activity is decreased to a point that drug interference should be considered. 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   We called him with the test results.    His wife called in the interim earlier this month because he had side effects with the ropinirole and I advised him to taper off of the medication.     08/05/19: (He) reports problems with his fine motor skills and coordination for the past 2+ years.  He feels that his symptoms are primarily  noted in the right hand.  He has not had any tremors but has had difficulty with buttoning and also when playing the guitar.  He has been playing the guitar off and on for over 50 years.  He picked it up again after he retired some 2-1/2 years ago and noticed that he was having trouble.  He has had some issues with arthritis in the hands and attributed most of his difficulty to arthritis.  He has noticed stiffness.  In addition, he has noticed slowness as an it takes him longer to do things.  He has had some balance problems but no recent fall with the exception of a recent incident at home when he slipped off of a yoga exercise ball, he fell but did not injure himself.  I reviewed your telemedicine note from 03/10/2019 as well as office note from 07/04/2018.  He denies any mood related issues or cognitive issues.  He sleeps fairly well but does have vivid dreams and reports acting out in his dreams from time to time.  Once he fell out of bed, his wife has noted that he mumbles or talks in his sleep and also moves his limbs while asleep.  This has been going on for years as he recalls.  He does not smoke any cigarettes except for some smoking when he was a teenager reported.  He does smoke the pipe, 1 or 2/day.  He drinks alcohol in the form of some liquor, 1-2 servings per day on average.  He drinks caffeine in the form of coffee.  He had a knee replacement surgery last year.  He no longer takes any narcotic pain medication but does take ibuprofen 800 mg 2 or 3 times a day as needed.  He is on Lupron.  His Past Medical History Is Significant For: Past Medical History:  Diagnosis Date  . Anxiety   . Arthritis   . GERD (gastroesophageal reflux disease)    occ remote history  . Hypertension   . Prostate cancer ([place])   . Right knee pain    questionable meniscal tear  . Seasonal allergies     His Past Surgical History Is Significant For: Past Surgical History:  Procedure Laterality Date  . COLONOSCOPY     . Leg Laceration Repair  Left    Age 12  . PELVIC LYMPH NODE DISSECTION Bilateral 03/22/2018   Procedure: PELVIC LYMPH NODE DISSECTION;  Surgeon: Ceasar Mons, MD;  Location: [facility];  Service: Urology;  Laterality: Bilateral;  . PROSTATE BIOPSY    . ROBOT ASSISTED LAPAROSCOPIC RADICAL PROSTATECTOMY N/A 03/22/2018   Procedure: XI ROBOTIC ASSISTED LAPAROSCOPIC PROSTATECTOMY;  Surgeon: Ceasar Mons, MD;  Location: [facility];  Service: Urology;  Laterality: N/A;  . TOTAL KNEE ARTHROPLASTY Right 05/05/2019   Procedure: RIGHT TOTAL KNEE ARTHROPLASTY;  Surgeon: Frederik Pear, MD;  Location: [facility];  Service: Orthopedics;  Laterality: Right;    His Family History Is Significant For: Family History  Problem Relation Age of Onset  . Breast cancer Mother        19s  . Heart attack Father 72  . Colon cancer Maternal Grandmother   . Prostate cancer Neg Hx   . Pancreatic cancer Neg Hx     His Social History Is Significant For: Social History   Socioeconomic History  . Marital status: Married    Spouse name: Not on file  . Number of children: 2  . Years of education: Not on file  . Highest education level: Not on file  Occupational History  . Not on file  Tobacco Use  . Smoking status: Current Every Day Smoker    Years: 20.00    Types: Pipe  . Smokeless tobacco: Never Used  Vaping Use  . Vaping Use: Never used  Substance and Sexual Activity  . Alcohol use: Yes    Alcohol/week: 1.0 - 2.0 standard drink    Types: 1 - 2 Shots of liquor per week    Comment: per day  . Drug use: Never  . Sexual activity: Not Currently  Other Topics Concern  . Not on file  Social History Narrative  . Not on file   Social Determinants of Health   Financial Resource Strain:   . Difficulty of Paying Living Expenses: Not on file  Food Insecurity:   . Worried About Charity fundraiser in the Last Year: Not on file  . Ran Out of Food in the Last Year: Not on file   Transportation Needs:   . Lack of Transportation (Medical): Not on file  . Lack of Transportation (Non-Medical): Not on file  Physical Activity:   . Days of Exercise per Week: Not on file  . Minutes of Exercise per Session: Not on file  Stress:   . Feeling of Stress : Not on file  Social Connections:   . Frequency of Communication with Friends and Family: Not on file  . Frequency of Social Gatherings with Friends and Family: Not on file  . Attends Religious Services: Not on file  . Active Member of Clubs or Organizations: Not on file  . Attends Archivist Meetings: Not on file  . Marital Status: Not on file    His Allergies Are:  Allergies  Allergen Reactions  . Other     Senior dose of flu shot, cause hear palpitations, anxious  . Peanut-Containing Drug Products Other (See Comments)    Nasal congestion  :   His Current Medications Are:  Outpatient Encounter Medications as of 04/21/2020  Medication Sig  . carbidopa-levodopa (SINEMET IR) 25-100 MG tablet Take 2 tablets by mouth 3 (three) times daily. 2 pills 3 times a day  . fluticasone (FLONASE) 50 MCG/ACT nasal spray Place 1-2 sprays into both nostrils daily as needed for allergies or rhinitis.  Marland Kitchen ibuprofen (ADVIL) 800 MG tablet Take 800 mg by mouth in the morning and at bedtime.  Marland Kitchen leuprolide, 6 Month, (ELIGARD) 45 MG injection Inject 45 mg into the skin every 6 (six) months.  Marland Kitchen lisinopril (ZESTRIL) 20 MG tablet Take 20 mg by mouth daily.  Marland Kitchen loratadine (CLARITIN) 10 MG tablet Take 10 mg by mouth daily.  . mirabegron ER (MYRBETRIQ) 50 MG TB24 tablet Take 50 mg by mouth daily.  . [DISCONTINUED] cetirizine (ZYRTEC) 10 MG tablet Take 10 mg by mouth every morning.    No facility-administered encounter medications on  file as of 04/21/2020.  :  Review of Systems:  Out of a complete 14 point review of systems, all are reviewed and negative with the exception of these symptoms as listed below: Review of Systems   Neurological:       Pt presents today to discuss his PD. Pt thinks the increase in C/L is helping his symptoms.    Objective:  Neurological Exam  Physical Exam Physical Examination:   Vitals:   04/21/20 0906  BP: 107/68  Pulse: 69    General Examination: The patient is a very pleasant 69 y.o. male in no acute distress. He appears well-developed and well-nourished and well groomed.   HEENT:Normocephalic, atraumatic, pupils are equal, round and reactive to light, moderate nuchal rigidity, extraocular tracking is mildly impaired.  He has mild to moderate facial masking.  Hearing is intact, no significant leaning of his upper body noted.  He has no significant lip or jaw tremor.  He has mild hypophonia.  Airway examination reveals no significant mouth dryness, no significant drooling, tongue protrudes centrally and palate elevates symmetrically.   Chest:Clear to auscultation without wheezing, rhonchi or crackles noted.  Heart:S1+S2+0, regular and normal without murmurs, rubs or gallops noted.   Abdomen:Soft, non-tender and non-distended with normal bowel sounds appreciated on auscultation.  Extremities:There istracepitting edema in the R ankle, stable.  Skin: Warm and dry without trophic changes noted.  Musculoskeletal: exam reveals unremarkable right knee replacement scar.   Neurologically:  Mental status: The patient is awake, alert and oriented in all 4 spheres.Hisimmediate and remote memory, attention, language skills and fund of knowledge are appropriate. There is no evidence of aphasia, agnosia, apraxia or anomia. Speech is clear with normal prosody and enunciation. Thought process is linear. Mood is normaland affect is normal.  Cranial nerves II - XII are as described above under HEENT exam. In addition: shoulder shrug is normal with equal shoulder height noted. Motor exam: Normal bulk, strength. There is no drift,resting, postural or  actiontremor.  (On3/16/2021:On Archimedes spiral drawing he has no significant difficulty with the right hand, slight insecurity noted with the left hand, handwriting is legible for the most part but micrographic.)  Fine motor skills and coordination:He has fine motor difficulty bilaterally, right more than left, right in the mild to  moderate range.  He has mild to moderate bradykinesia.  No dyskinesias noted.  Tone is increased in both upper extremities.  Gait, station and balance:He stands up with mild difficulty and pushes himself up. Posture is mildly stooped, he has decreased arm swing bilaterally, right more so than left.  No obvious shuffling noted.  Cerebellar testing: No dysmetria or intention tremor. There is no truncal or gait ataxia.  Sensory exam: intact to light touch in the upper and lower extremities.  Assessmentand Plan:   In summary,Barlow Hiltonis a very pleasant 41 year oldmalewith an underlying medical history of hypertension, reflux disease, arthritiswith status post right total knee replacement, seasonal allergies, anxiety, and prostate cancer, status post prostate surgery in November 2019 and status post XRT in 2020, whopresents forfollow-up consultation of his parkinsonism with symptoms of over 2 years, and some lateralization noted to the right.  He does not have a telltale resting tremor.  His DaTscan was supportive of an underlying parkinsonian syndrome.  He had noted some benefit from ropinirole but side effects outweighed the benefits - he had significant exacerbation of his urinary incontinence and severe somnolence from it.   He is on Lupron injections. He is in close  follow-up with urology. Constipation has been an issue but he is proactive about it and takes Colace twice daily and Metamucil as needed. He is advised to stay well-hydrated and continue to try to stay active physically. He tries to stay mentally active.  He has benefited from therapy  through neuro rehab.  He has been on Sinemet, which we increased from 1 pill 3 times daily to 2 pills three times a day gradually, after our visit in August 2021. He was advised to take melatonin consistently either 3 mg or up to 6 mg about 2 hours before his bedtime.  He has benefited from the increase in levodopa.  We mutually agreed to keep him on the current dose, prescription is up-to-date.  He is advised to continue with melatonin and increase to 6 mg at night if needed.  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  He is advised to stay well-hydrated and well rested and continue to exercise on a regular basis.  He is advised to follow-up routinely in 6 months, sooner if needed.  I answered all their questions today and the patient and his wife were in agreement.   I spent 30 minutes in total face-to-face time and in reviewing records during pre-charting, more than 50% of which was spent in counseling and coordination of care, reviewing test results, reviewing medications and treatment regimen and/or in discussing or reviewing the diagnosis of PD, the prognosis and treatment options. Pertinent laboratory and imaging test results that were available during this visit with the patient were reviewed by me and considered in my medical decision making (see chart for details).

## 2020-04-21 NOTE — Patient Instructions (Signed)
I am glad to hear that you are able to tolerate the Sinemet at the current dose of 2 pills 3 times daily.  I do believe you have done well.    Please continue the exercises you were given from going to therapy.  In addition, continue to work on cardio exercises such as using a stationary bike or walking in the neighborhood if weather permits.  Also work on range of motion exercises particularly in the shoulder and neck areas.  Please continue with melatonin 3 mg or up to 6 mg each night.  Try to hydrate well with water and continue to be proactive about constipation issues, use MiraLAX or some laxative as needed in addition to your current daily regimen.  Please follow-up routinely in 6 months.  Your prescription should be up-to-date.

## 2020-05-06 DIAGNOSIS — C61 Malignant neoplasm of prostate: Secondary | ICD-10-CM | POA: Diagnosis not present

## 2020-05-11 DIAGNOSIS — I10 Essential (primary) hypertension: Secondary | ICD-10-CM | POA: Diagnosis not present

## 2020-05-11 DIAGNOSIS — M19049 Primary osteoarthritis, unspecified hand: Secondary | ICD-10-CM | POA: Diagnosis not present

## 2020-05-11 DIAGNOSIS — G20 Parkinson's disease: Secondary | ICD-10-CM | POA: Diagnosis not present

## 2020-05-11 DIAGNOSIS — C61 Malignant neoplasm of prostate: Secondary | ICD-10-CM | POA: Diagnosis not present

## 2020-05-11 DIAGNOSIS — E7800 Pure hypercholesterolemia, unspecified: Secondary | ICD-10-CM | POA: Diagnosis not present

## 2020-05-13 DIAGNOSIS — C61 Malignant neoplasm of prostate: Secondary | ICD-10-CM | POA: Diagnosis not present

## 2020-05-13 DIAGNOSIS — N3946 Mixed incontinence: Secondary | ICD-10-CM | POA: Diagnosis not present

## 2020-05-31 DIAGNOSIS — M6281 Muscle weakness (generalized): Secondary | ICD-10-CM | POA: Diagnosis not present

## 2020-05-31 DIAGNOSIS — M6289 Other specified disorders of muscle: Secondary | ICD-10-CM | POA: Diagnosis not present

## 2020-05-31 DIAGNOSIS — N3946 Mixed incontinence: Secondary | ICD-10-CM | POA: Diagnosis not present

## 2020-05-31 DIAGNOSIS — M62838 Other muscle spasm: Secondary | ICD-10-CM | POA: Diagnosis not present

## 2020-06-01 DIAGNOSIS — G20 Parkinson's disease: Secondary | ICD-10-CM | POA: Diagnosis not present

## 2020-06-01 DIAGNOSIS — C61 Malignant neoplasm of prostate: Secondary | ICD-10-CM | POA: Diagnosis not present

## 2020-06-01 DIAGNOSIS — I10 Essential (primary) hypertension: Secondary | ICD-10-CM | POA: Diagnosis not present

## 2020-06-01 DIAGNOSIS — E7800 Pure hypercholesterolemia, unspecified: Secondary | ICD-10-CM | POA: Diagnosis not present

## 2020-06-01 DIAGNOSIS — M19049 Primary osteoarthritis, unspecified hand: Secondary | ICD-10-CM | POA: Diagnosis not present

## 2020-06-02 ENCOUNTER — Telehealth: Payer: Self-pay | Admitting: Neurology

## 2020-06-02 NOTE — Telephone Encounter (Signed)
I called patient's wife, [name], per DPR.  Patient's sinemet looks different from their new pharmacy.  I advised her to check with the pharmacy to make sure it is accurate.  I advised her that it may just be because it is a Risk analyst.  Patient's wife will call the pharmacy before patient takes the medication.

## 2020-06-02 NOTE — Telephone Encounter (Signed)
Pt's wife, Paulanthony Gleaves (on [place]) called, picked up refill from the new pharmacy for carbidopa-levodopa (SINEMET IR) 25-100 MG tablet. The pill looks different, instead of white the pill is yellow. Want to make sure medication is the right medication before he takes it. Would like a call from the nurse.

## 2020-06-14 DIAGNOSIS — R3912 Poor urinary stream: Secondary | ICD-10-CM | POA: Diagnosis not present

## 2020-06-14 DIAGNOSIS — N393 Stress incontinence (female) (male): Secondary | ICD-10-CM | POA: Diagnosis not present

## 2020-06-14 DIAGNOSIS — N3941 Urge incontinence: Secondary | ICD-10-CM | POA: Diagnosis not present

## 2020-06-14 DIAGNOSIS — R102 Pelvic and perineal pain: Secondary | ICD-10-CM | POA: Diagnosis not present

## 2020-06-14 DIAGNOSIS — R1031 Right lower quadrant pain: Secondary | ICD-10-CM | POA: Diagnosis not present

## 2020-06-14 DIAGNOSIS — N3946 Mixed incontinence: Secondary | ICD-10-CM | POA: Diagnosis not present

## 2020-06-21 DIAGNOSIS — I10 Essential (primary) hypertension: Secondary | ICD-10-CM | POA: Diagnosis not present

## 2020-06-28 DIAGNOSIS — N3941 Urge incontinence: Secondary | ICD-10-CM | POA: Diagnosis not present

## 2020-06-28 DIAGNOSIS — N393 Stress incontinence (female) (male): Secondary | ICD-10-CM | POA: Diagnosis not present

## 2020-06-28 DIAGNOSIS — M6281 Muscle weakness (generalized): Secondary | ICD-10-CM | POA: Diagnosis not present

## 2020-06-28 DIAGNOSIS — R102 Pelvic and perineal pain: Secondary | ICD-10-CM | POA: Diagnosis not present

## 2020-06-28 DIAGNOSIS — R3912 Poor urinary stream: Secondary | ICD-10-CM | POA: Diagnosis not present

## 2020-06-28 DIAGNOSIS — N3946 Mixed incontinence: Secondary | ICD-10-CM | POA: Diagnosis not present

## 2020-07-04 DIAGNOSIS — E7800 Pure hypercholesterolemia, unspecified: Secondary | ICD-10-CM | POA: Diagnosis not present

## 2020-07-04 DIAGNOSIS — M19049 Primary osteoarthritis, unspecified hand: Secondary | ICD-10-CM | POA: Diagnosis not present

## 2020-07-04 DIAGNOSIS — G20 Parkinson's disease: Secondary | ICD-10-CM | POA: Diagnosis not present

## 2020-07-04 DIAGNOSIS — C61 Malignant neoplasm of prostate: Secondary | ICD-10-CM | POA: Diagnosis not present

## 2020-07-04 DIAGNOSIS — I10 Essential (primary) hypertension: Secondary | ICD-10-CM | POA: Diagnosis not present

## 2020-07-06 DIAGNOSIS — U071 COVID-19: Secondary | ICD-10-CM | POA: Diagnosis not present

## 2020-07-06 DIAGNOSIS — R059 Cough, unspecified: Secondary | ICD-10-CM | POA: Diagnosis not present

## 2020-07-06 DIAGNOSIS — B349 Viral infection, unspecified: Secondary | ICD-10-CM | POA: Diagnosis not present

## 2020-07-06 DIAGNOSIS — R509 Fever, unspecified: Secondary | ICD-10-CM | POA: Diagnosis not present

## 2020-07-08 ENCOUNTER — Other Ambulatory Visit: Payer: Self-pay | Admitting: Family

## 2020-07-08 ENCOUNTER — Telehealth: Payer: Self-pay | Admitting: Family

## 2020-07-08 ENCOUNTER — Telehealth: Payer: Self-pay

## 2020-07-08 ENCOUNTER — Telehealth: Payer: Self-pay | Admitting: Neurology

## 2020-07-08 DIAGNOSIS — U071 COVID-19: Secondary | ICD-10-CM

## 2020-07-08 DIAGNOSIS — G20 Parkinson's disease: Secondary | ICD-10-CM

## 2020-07-08 DIAGNOSIS — C61 Malignant neoplasm of prostate: Secondary | ICD-10-CM

## 2020-07-08 DIAGNOSIS — G20A1 Parkinson's disease without dyskinesia, without mention of fluctuations: Secondary | ICD-10-CM

## 2020-07-08 NOTE — Telephone Encounter (Signed)
Called to discuss with patient about COVID-19 symptoms and the use of one of the available treatments for those with mild to moderate Covid symptoms and at a high risk of hospitalization.  Pt appears to qualify for outpatient treatment due to co-morbid conditions and/or a member of an at-risk group in accordance with the FDA Emergency Use Authorization.    Symptom onset: 07/14/20 Cough, fatigue,  Vaccinated: Yes Booster? Yes Immunocompromised? Yes Qualifiers: Parkinson's,Prostate cancer  Pt. Would like to speak with APP.   Marcello Moores

## 2020-07-08 NOTE — Telephone Encounter (Signed)
Follow up call to RN Pre-screen.  Mr. Hearne had symptoms starting on 07/04/20 and continues to have cough and fatigue. He is vaccinated and boosted. Qualifying risk factors include age and prostate cancer. Tested positive through [employer]. We discussed the risks, benefits and possible financial costs associated with Sotrovimab or Remdesivir. Mr. Lachman is going to check with his insurance company to determine coverage. Call back number provided.  Terri Piedra, NP 07/08/2020 10:43 AM

## 2020-07-08 NOTE — Telephone Encounter (Signed)
Pt notified of Dr. Guadelupe Sabin recommendation. He is going to call his PCP and see what his thoughts. Pt sts he is feeling pretty bad but doing ok.

## 2020-07-08 NOTE — Telephone Encounter (Signed)
Wife(on DPR) states pt has Covid and would like to know what Dr Rexene Alberts has to say about pt going to the hospital for the infusion for Covid-19 and how it may affect his Parkinson's

## 2020-07-08 NOTE — Progress Notes (Signed)
I connected by phone with Maryan Puls on 07/08/2020 at 2:46 PM to discuss the potential use of the antiviral REMDESIVIR for acute COVID-19 viral infection in non-hospitalized patients.   This patient is a 70 y.o. male that meets the FDA criteria for Emergency Use Authorization of [place].  Has a (+) direct SARS-CoV-2 viral test result  Has mild or moderate symptoms related to COVID-19  Is within 7 days of symptom onset  Has at least one of the high risk factor(s) for progression to severe COVID-19 and/or hospitalization as defined in NIH Guidelines and EUA.   Specific high risk criteria : Older age (>/= 69 yo), Immunosuppressive Disease or Treatment and Neurodevelopmental disorder   I have spoken and communicated the following to the patient or parent/caregiver regarding COVID-19 IV Antiviral treatment:  1. FDA has authorized the emergency use for the treatment of mild to moderate COVID-19 in adults and pediatric patients with positive results of SARS-CoV-2 testing who are 20 years of age and older weighing at least 40 kg, and who are at high risk for progressing to severe COVID-19 and/or hospitalization.  2. The significant known and potential risks and benefits in receiving REMDESIVIR in accordance with current NIH treatment guidelines.   3. Information on available alternative treatments and the risks and benefits of those alternatives, including clinical trials that may be accessible to the patient.   4. Patients treated with antiviral therapy should continue to isolate and use infection control measures (e.g., wear mask, isolate, social distance, avoid sharing personal items, clean and disinfect "high touch" surfaces, and frequent handwashing) according to CDC guidelines.   5. The patient or parent/caregiver has the option to accept or refuse REMDESIVIR therapy and has had the opportunity to have all questions addressed prior to consenting for treatment.    After reviewing this  information with the patient, he/she has decided to proceed with the 3 day course of treatment.   Mauricio Po, FNP 07/08/2020  2:46 PM

## 2020-07-08 NOTE — Telephone Encounter (Signed)
Received return call that Mr. Matthew [name] would like to pursue treatment with remdesivir.  [place],   We have contacted you because you have recently been diagnosed with COVID-19 with active symptoms of less than 7 days in duration and may benefit from a treatment for mild to moderate disease. This treatment has been shown to reduce the risk of hospitalization and decrease the risk for severe symptoms related to COVID-19 in a select group of patients with risk factors / medical conditions we believe put people at a higher risk for this infection.    Remdesivir is an antiviral medicine to treat certain people with CVELF-81. Remdesivir was shown in a clinical trial to lower the risk of hospitalizations related to Covid-19. This treatment is now FDA approved but being used off label in the outpatient setting.   Possible side effects of remdesivir are:  . Infusion-related reactions. Infusion-related reactions have been seen during a remdesivir infusion or around the time remdesivir was given. Signs and symptoms of infusion-related reactions may include: low blood pressure, nausea, vomiting, sweating, and shivering. . Increases in levels of liver enzymes. Increases in levels of liver enzymes have been seen in people who have received remdesivir, which may be a sign of inflammation or damage to cells in the liver. Your healthcare provider will do blood tests to check your liver before you receive remdesivir and daily while receiving remdesivir.  . These are not all the possible side effects of remdesivir. Remdesivir is still being studied so it is possible that all of the risks are not known at this time.  . Your insurance will be charged for the medication and an infusion fee. The amount you may owe later varies from insurance to insurance. If you do not have insurance, we can put you in touch with our billing department: [phone]. . If you have been tested outside of a [facility] -  you MUST bring a copy of your positive test with you the morning of your appointment. You may take a photo of this and upload to your MyChart portal, have the testing facility fax the result to [phone] or e-mail result to [email or web address].   You have been scheduled to receive the remdesivir therapy at [facility]. You will receive three infusions scheduled on:    Dose 1: 200  Dose 2: 100  Dose 3: 100   The address for the infusion clinic site is:  --GPS address is [place] - the parking is located near [employer] building where you will see COVID19 Infusion feather banner marking the entrance to parking.   (see photos below)            --Enter into the 2nd entrance where the "wave, flag banner" is at the road. Turn into this 2nd entrance and immediately turn left to park in 1 of the 5 parking spots.   --Please stay in your car and call the desk for assistance inside [phone].   --Average time in department for the first infusion is roughly 90 minutes to 2 hours- this includes preparation of the medication, IV start and the required 1 hour monitoring after the infusion. Subsequent infusions will be shorter.    Should you develop worsening shortness of breath, chest pain or severe breathing problems please do not wait for this appointment and go to the Emergency room for evaluation and treatment. You will undergo another oxygen screen before your infusion to ensure this is the best treatment option  for you. There is a chance that the best decision may be to send you to the Emergency Room for evaluation at the time of your appointment.   The day of your visit you should: Marland Kitchen Get plenty of rest the night before and drink plenty of water . Eat a light meal/snack before coming and take your medications as prescribed  . Wear warm, comfortable clothes with a shirt that can roll-up over the elbow (will need IV start).  . Wear a mask  . Consider bringing some  activity to help pass the time  Terri Piedra, NP 07/08/2020 2:43 PM

## 2020-07-08 NOTE — Telephone Encounter (Signed)
Please advise patient that if he has Covid and has symptoms such as cough or shortness of breath or fever, he should get checked out immediately.  If he should get or does not qualify for the infusion should be determined by a medical doctor.

## 2020-07-09 ENCOUNTER — Ambulatory Visit (HOSPITAL_COMMUNITY)
Admission: RE | Admit: 2020-07-09 | Discharge: 2020-07-09 | Disposition: A | Discharge status: Home / Self Care | Payer: PPO | Source: Ambulatory Visit | Attending: Pulmonary Disease | Admitting: Pulmonary Disease

## 2020-07-09 DIAGNOSIS — G20A1 Parkinson's disease without dyskinesia, without mention of fluctuations: Secondary | ICD-10-CM

## 2020-07-09 DIAGNOSIS — U071 COVID-19: Secondary | ICD-10-CM | POA: Diagnosis not present

## 2020-07-09 DIAGNOSIS — C61 Malignant neoplasm of prostate: Secondary | ICD-10-CM | POA: Insufficient documentation

## 2020-07-09 DIAGNOSIS — G20 Parkinson's disease: Secondary | ICD-10-CM | POA: Diagnosis not present

## 2020-07-09 MED ORDER — METHYLPREDNISOLONE SODIUM SUCC 125 MG IJ SOLR: 125.0000 mg | Freq: Once | INTRAMUSCULAR | Status: DC | PRN

## 2020-07-09 MED ORDER — SODIUM CHLORIDE 0.9 % IV SOLN
100.0000 mg | Freq: Once | INTRAVENOUS | Status: AC
  Administered 2020-07-09: 100 mg via INTRAVENOUS

## 2020-07-09 MED ORDER — EPINEPHRINE 0.3 MG/0.3ML IJ SOAJ: 0.3000 mg | Freq: Once | INTRAMUSCULAR | Status: DC | PRN

## 2020-07-09 MED ORDER — FAMOTIDINE IN NACL 20-0.9 MG/50ML-% IV SOLN: 20.0000 mg | Freq: Once | INTRAVENOUS | Status: DC | PRN

## 2020-07-09 MED ORDER — ALBUTEROL SULFATE HFA 108 (90 BASE) MCG/ACT IN AERS: 2.0000 | INHALATION_SPRAY | Freq: Once | RESPIRATORY_TRACT | Status: DC | PRN

## 2020-07-09 MED ORDER — SODIUM CHLORIDE 0.9 % IV SOLN: INTRAVENOUS | Status: DC | PRN

## 2020-07-09 MED ORDER — DIPHENHYDRAMINE HCL 50 MG/ML IJ SOLN: 50.0000 mg | Freq: Once | INTRAMUSCULAR | Status: DC | PRN

## 2020-07-09 NOTE — Progress Notes (Signed)
  Diagnosis: COVID-19  Physician: Joya Gaskins, MD  Procedure: [facility] Med: remdesivir infusion - Provided patient with remdesivir fact sheet for patients, parents and caregivers prior to infusion.  Complications: No immediate complications noted.  Discharge: Discharged home   [place] 07/09/2020

## 2020-07-09 NOTE — Progress Notes (Signed)
Patient reviewed Fact Sheet for Patients, Parents, and Caregivers for Emergency Use Authorization (EUA) of remdesivir for the Treatment of Coronavirus. Patient also reviewed and is agreeable to the estimated cost of treatment. Patient is agreeable to proceed.    

## 2020-07-09 NOTE — Discharge Instructions (Signed)
10 Things You Can Do to Manage Your COVID-19 Symptoms at Home If you have possible or confirmed COVID-19: 1. Stay home except to get medical care. 2. Monitor your symptoms carefully. If your symptoms get worse, call your healthcare provider immediately. 3. Get rest and stay hydrated. 4. If you have a medical appointment, call the healthcare provider ahead of time and tell them that you have or may have COVID-19. 5. For medical emergencies, call 911 and notify the dispatch personnel that you have or may have COVID-19. 6. Cover your cough and sneezes with a tissue or use the inside of your elbow. 7. Wash your hands often with soap and water for at least 20 seconds or clean your hands with an alcohol-based hand sanitizer that contains at least 60% alcohol. 8. As much as possible, stay in a specific room and away from other people in your home. Also, you should use a separate bathroom, if available. If you need to be around other people in or outside of the home, wear a mask. 9. Avoid sharing personal items with other people in your household, like dishes, towels, and bedding. 10. Clean all surfaces that are touched often, like counters, tabletops, and doorknobs. Use household cleaning sprays or wipes according to the label instructions.    If you have any questions or concerns after the infusion please call the Advanced Practice Provider on call at [phone]. This number is ONLY intended for your use regarding questions or concerns about the infusion post-treatment side-effects.  Please do not provide this number to others for use. For return to work notes please contact your primary care provider.   If someone you know is interested in receiving treatment please have them contact their MD for a referral or visit [email or web address]

## 2020-07-09 NOTE — Progress Notes (Signed)
  Diagnosis: COVID-19  Physician: Dr. Asencion Noble  Procedure: [facility] Med: remdesivir infusion - Provided patient with remdesivir fact sheet for patients, parents and caregivers prior to infusion.  Complications: No immediate complications noted.  Discharge: Discharged home   Matthew [name] 07/09/2020

## 2020-07-10 ENCOUNTER — Other Ambulatory Visit (HOSPITAL_COMMUNITY): Payer: Self-pay

## 2020-07-10 ENCOUNTER — Ambulatory Visit (HOSPITAL_COMMUNITY)
Admission: RE | Admit: 2020-07-10 | Discharge: 2020-07-10 | Disposition: A | Discharge status: Home / Self Care | Payer: PPO | Source: Ambulatory Visit | Attending: Pulmonary Disease | Admitting: Pulmonary Disease

## 2020-07-10 DIAGNOSIS — G20A1 Parkinson's disease without dyskinesia, without mention of fluctuations: Secondary | ICD-10-CM

## 2020-07-10 DIAGNOSIS — G20 Parkinson's disease: Secondary | ICD-10-CM | POA: Diagnosis not present

## 2020-07-10 DIAGNOSIS — U071 COVID-19: Secondary | ICD-10-CM | POA: Insufficient documentation

## 2020-07-10 DIAGNOSIS — C61 Malignant neoplasm of prostate: Secondary | ICD-10-CM | POA: Insufficient documentation

## 2020-07-10 MED ORDER — SODIUM CHLORIDE 0.9 % IV SOLN: INTRAVENOUS | Status: DC | PRN

## 2020-07-10 MED ORDER — ALBUTEROL SULFATE HFA 108 (90 BASE) MCG/ACT IN AERS: 2.0000 | INHALATION_SPRAY | Freq: Once | RESPIRATORY_TRACT | Status: DC | PRN

## 2020-07-10 MED ORDER — METHYLPREDNISOLONE SODIUM SUCC 125 MG IJ SOLR: 125.0000 mg | Freq: Once | INTRAMUSCULAR | Status: DC | PRN

## 2020-07-10 MED ORDER — SODIUM CHLORIDE 0.9 % IV SOLN
100.0000 mg | Freq: Once | INTRAVENOUS | Status: AC
  Administered 2020-07-10: 100 mg via INTRAVENOUS

## 2020-07-10 MED ORDER — FAMOTIDINE IN NACL 20-0.9 MG/50ML-% IV SOLN: 20.0000 mg | Freq: Once | INTRAVENOUS | Status: DC | PRN

## 2020-07-10 MED ORDER — DIPHENHYDRAMINE HCL 50 MG/ML IJ SOLN: 50.0000 mg | Freq: Once | INTRAMUSCULAR | Status: DC | PRN

## 2020-07-10 MED ORDER — EPINEPHRINE 0.3 MG/0.3ML IJ SOAJ: 0.3000 mg | Freq: Once | INTRAMUSCULAR | Status: DC | PRN

## 2020-07-10 NOTE — Progress Notes (Signed)
  Diagnosis: COVID-19  Physician:Dr Joya Gaskins   Procedure: [facility] Med: remdesivir infusion - Provided patient with remdesivir fact sheet for patients, parents and caregivers prior to infusion.  Complications: No immediate complications noted.  Discharge: Discharged home   Dewaine Oats 07/10/2020

## 2020-07-10 NOTE — Progress Notes (Signed)
Patient reviewed Fact Sheet for Patients, Parents, and Caregivers for Emergency Use Authorization (EUA) of Remdesivir for the Treatment of Coronavirus. Patient also reviewed and is agreeable to the estimated cost of treatment. Patient is agreeable to proceed.

## 2020-07-10 NOTE — Discharge Instructions (Signed)

## 2020-07-12 ENCOUNTER — Ambulatory Visit (HOSPITAL_COMMUNITY)
Admission: RE | Admit: 2020-07-12 | Discharge: 2020-07-12 | Disposition: A | Discharge status: Home / Self Care | Payer: PPO | Source: Ambulatory Visit | Attending: Pulmonary Disease | Admitting: Pulmonary Disease

## 2020-07-12 DIAGNOSIS — U071 COVID-19: Secondary | ICD-10-CM | POA: Diagnosis not present

## 2020-07-12 DIAGNOSIS — G20 Parkinson's disease: Secondary | ICD-10-CM

## 2020-07-12 DIAGNOSIS — C61 Malignant neoplasm of prostate: Secondary | ICD-10-CM

## 2020-07-12 DIAGNOSIS — G20A1 Parkinson's disease without dyskinesia, without mention of fluctuations: Secondary | ICD-10-CM

## 2020-07-12 MED ORDER — EPINEPHRINE 0.3 MG/0.3ML IJ SOAJ: 0.3000 mg | Freq: Once | INTRAMUSCULAR | Status: DC | PRN

## 2020-07-12 MED ORDER — METHYLPREDNISOLONE SODIUM SUCC 125 MG IJ SOLR: 125.0000 mg | Freq: Once | INTRAMUSCULAR | Status: DC | PRN

## 2020-07-12 MED ORDER — SODIUM CHLORIDE 0.9 % IV SOLN: INTRAVENOUS | Status: DC | PRN

## 2020-07-12 MED ORDER — DIPHENHYDRAMINE HCL 50 MG/ML IJ SOLN: 50.0000 mg | Freq: Once | INTRAMUSCULAR | Status: DC | PRN

## 2020-07-12 MED ORDER — ALBUTEROL SULFATE HFA 108 (90 BASE) MCG/ACT IN AERS: 2.0000 | INHALATION_SPRAY | Freq: Once | RESPIRATORY_TRACT | Status: DC | PRN

## 2020-07-12 MED ORDER — SODIUM CHLORIDE 0.9 % IV SOLN
100.0000 mg | Freq: Once | INTRAVENOUS | Status: AC
  Administered 2020-07-12: 100 mg via INTRAVENOUS

## 2020-07-12 MED ORDER — FAMOTIDINE IN NACL 20-0.9 MG/50ML-% IV SOLN: 20.0000 mg | Freq: Once | INTRAVENOUS | Status: DC | PRN

## 2020-07-12 NOTE — Progress Notes (Signed)
  Diagnosis: COVID-19  Physician: Asencion Noble, MD  Procedure: [facility] Med: remdesivir infusion - Provided patient with remdesivir fact sheet for patients, parents and caregivers prior to infusion.  Complications: No immediate complications noted.  Discharge: Discharged home   Janne Napoleon 07/12/2020

## 2020-07-12 NOTE — Discharge Instructions (Signed)
10 Things You Can Do to Manage Your COVID-19 Symptoms at Home °If you have possible or confirmed COVID-19: °1. Stay home except to get medical care. °2. Monitor your symptoms carefully. If your symptoms get worse, call your healthcare provider immediately. °3. Get rest and stay hydrated. °4. If you have a medical appointment, call the healthcare provider ahead of time and tell them that you have or may have COVID-19. °5. For medical emergencies, call 911 and notify the dispatch personnel that you have or may have COVID-19. °6. Cover your cough and sneezes with a tissue or use the inside of your elbow. °7. Wash your hands often with soap and water for at least 20 seconds or clean your hands with an alcohol-based hand sanitizer that contains at least 60% alcohol. °8. As much as possible, stay in a specific room and away from other people in your home. Also, you should use a separate bathroom, if available. If you need to be around other people in or outside of the home, wear a mask. °9. Avoid sharing personal items with other people in your household, like dishes, towels, and bedding. °10. Clean all surfaces that are touched often, like counters, tabletops, and doorknobs. Use household cleaning sprays or wipes according to the label instructions. °cdc.gov/coronavirus °12/05/2019 °This information is not intended to replace advice given to you by your health care provider. Make sure you discuss any questions you have with your health care provider. °Document Revised: 03/22/2020 Document Reviewed: 03/22/2020 °Elsevier Patient Education © 2021 [employer] °If you have any questions or concerns after the infusion please call the Advanced Practice Provider on call at [phone]. This number is ONLY intended for your use regarding questions or concerns about the infusion post-treatment side-effects.  Please do not provide this number to others for use. For return to work notes please contact your primary care provider.   ° °If someone you know is interested in receiving treatment please have them contact their MD for a referral or visit [email or web address] ° ° ° °

## 2020-07-29 DIAGNOSIS — E7800 Pure hypercholesterolemia, unspecified: Secondary | ICD-10-CM | POA: Diagnosis not present

## 2020-07-29 DIAGNOSIS — I10 Essential (primary) hypertension: Secondary | ICD-10-CM | POA: Diagnosis not present

## 2020-07-29 DIAGNOSIS — M19049 Primary osteoarthritis, unspecified hand: Secondary | ICD-10-CM | POA: Diagnosis not present

## 2020-07-29 DIAGNOSIS — G20 Parkinson's disease: Secondary | ICD-10-CM | POA: Diagnosis not present

## 2020-07-29 DIAGNOSIS — C61 Malignant neoplasm of prostate: Secondary | ICD-10-CM | POA: Diagnosis not present

## 2020-08-10 ENCOUNTER — Other Ambulatory Visit: Payer: Self-pay

## 2020-08-10 ENCOUNTER — Ambulatory Visit: Payer: PPO | Attending: Neurology | Admitting: Physical Therapy

## 2020-08-10 ENCOUNTER — Ambulatory Visit: Payer: PPO

## 2020-08-10 ENCOUNTER — Telehealth: Payer: Self-pay

## 2020-08-10 ENCOUNTER — Ambulatory Visit: Payer: PPO | Admitting: Occupational Therapy

## 2020-08-10 DIAGNOSIS — R4701 Aphasia: Secondary | ICD-10-CM

## 2020-08-10 DIAGNOSIS — R2689 Other abnormalities of gait and mobility: Secondary | ICD-10-CM | POA: Insufficient documentation

## 2020-08-10 DIAGNOSIS — M6281 Muscle weakness (generalized): Secondary | ICD-10-CM

## 2020-08-10 DIAGNOSIS — R29818 Other symptoms and signs involving the nervous system: Secondary | ICD-10-CM | POA: Insufficient documentation

## 2020-08-10 DIAGNOSIS — R471 Dysarthria and anarthria: Secondary | ICD-10-CM | POA: Insufficient documentation

## 2020-08-10 DIAGNOSIS — R1310 Dysphagia, unspecified: Secondary | ICD-10-CM

## 2020-08-10 DIAGNOSIS — R2681 Unsteadiness on feet: Secondary | ICD-10-CM

## 2020-08-10 NOTE — Therapy (Signed)
[facility] [address] [place], [place], [address] Phone: [phone]   Fax:  [phone]  Patient Details  Name: Handy Mcloud MRN: [id number] Date of Birth: 09/23/1950 Referring Provider:  Star Age, MD  Encounter Date: 08/10/2020  Speech Therapy Parkinson's Disease Screen   Decibel Level today: upper 60sdB - low 70s dB (WNL=70-72 dB) with sound level meter 30cm away from pt's mouth. Pt's conversational volume has decr'd since last treatment course. [name], pt's wife states pt's words "run together" at times so that she has difficulty understanding pt.   Pt reports difficulty in swallowing warranting further evaluation upon initiation of ST.  Pt would benefit from speech-language eval for dysarthria - please order via EPIC to schedule.   [facility] ,[place], [place]  08/10/2020, 1:51 PM  [facility] [address] [place], [place], [address] Phone: [phone]   Fax:  [phone]

## 2020-08-10 NOTE — Telephone Encounter (Signed)
Today Mr. Matthew [name] was screened at our multi-d Parkinson's disease screening and it was found pt would benefit from evaluations in OT for decr'd difficulty with ADLs and decr'd coordination, in PT for slowed mobility measures and decr'd balance, and in ST for decr'd voice volume and speech clarity and incr frequency of drooling and coughing during meals.  If agreed, please order OT  ST and PT eval and treat via Epic. Thank you.  Garald Balding, MS, [place] [phone]

## 2020-08-10 NOTE — Therapy (Signed)
[facility] [address] [place], [place], [address] Phone: [phone]   Fax:  [phone]  Patient Details  Name: Matthew [name] MRN: [id number] Date of Birth: 09-Apr-1951 Referring Provider:  Lawerance Cruel, MD  Encounter Date: 08/10/2020  Physical Therapy Parkinson's Disease Screen   Timed Up and Go test:13.09 sec  10 meter walk test: 13.03 sec = 2.52 ft/sec  5 time sit to stand test: 8.28 sec   Patient would benefit from Physical Therapy evaluation due to slowed mobility measures and pt's reported decreased balance and near falls.   Doing PD dance class with PD Dance project online.  Had some issues with balance, with some near falls; with getting up too quickly or with turns.  Lorree Millar W. 08/10/2020, 1:19 PM Mady Haagensen, PT 08/10/20 1:28 PM Phone: [phone] Fax: [facility] [address] [place], [place], [address] Phone: [phone]   Fax:  [phone]

## 2020-08-10 NOTE — Therapy (Signed)
[facility] [address] [place], [place], [address] Phone: [phone]   Fax:  [phone]  Patient Details  Name: Matthew [name] MRN: [id number] Date of Birth: [birth year] Referring Provider:  Star Age, MD  Encounter Date: 08/10/2020  Occupational Therapy Parkinson's Disease Screen  Hand dominance:  RUE   Physical Performance Test item #4 (donning/doffing jacket):  26.53 sec  Fastening/unfastening 3 buttons in:  23.04 sec  9-hole peg test:    RUE  38.94 sec        LUE  30.84 sec  Change in ability to perform ADLs/IADLs:  Difficulty eating, difficulty using R hand, takes 1 hr to get dressed.    Pt would benefit from occupational therapy evaluation due to  Difficulty and incr time needed for ADLs.    [facility] 08/10/2020, 1:35 PM  [facility] [address] [place], [place], [address] Phone: [phone]   Fax:  [place], OTR/L [facility] [address] [place], [place]  [address] [phone] phone [phone] 08/10/20 1:35 PM

## 2020-08-11 DIAGNOSIS — C61 Malignant neoplasm of prostate: Secondary | ICD-10-CM | POA: Diagnosis not present

## 2020-08-11 NOTE — Telephone Encounter (Signed)
Hi Matthew [name], I have placed the order as requested under Dr. Guadelupe Sabin name. Let me know if you need anything further for this order.  Thanks!

## 2020-08-19 DIAGNOSIS — I10 Essential (primary) hypertension: Secondary | ICD-10-CM | POA: Diagnosis not present

## 2020-08-25 ENCOUNTER — Ambulatory Visit: Payer: PPO | Admitting: Neurology

## 2020-08-25 ENCOUNTER — Encounter: Payer: Self-pay | Admitting: Neurology

## 2020-08-25 ENCOUNTER — Other Ambulatory Visit: Payer: Self-pay

## 2020-08-25 VITALS — BP 115/73 | HR 55 | Ht 66.0 in | Wt 165.0 lb

## 2020-08-25 DIAGNOSIS — K5909 Other constipation: Secondary | ICD-10-CM | POA: Diagnosis not present

## 2020-08-25 DIAGNOSIS — R419 Unspecified symptoms and signs involving cognitive functions and awareness: Secondary | ICD-10-CM

## 2020-08-25 DIAGNOSIS — G20 Parkinson's disease: Secondary | ICD-10-CM

## 2020-08-25 DIAGNOSIS — G20C Parkinsonism, unspecified: Secondary | ICD-10-CM

## 2020-08-25 DIAGNOSIS — G4752 REM sleep behavior disorder: Secondary | ICD-10-CM | POA: Diagnosis not present

## 2020-08-25 MED ORDER — ROTIGOTINE 2 MG/24HR TD PT24: 1.0000 | MEDICATED_PATCH | Freq: Every day | TRANSDERMAL | 3 refills | Status: DC

## 2020-08-25 MED ORDER — CARBIDOPA-LEVODOPA 25-100 MG PO TABS
2.0000 | ORAL_TABLET | Freq: Four times a day (QID) | ORAL | 3 refills | Status: DC
Start: 2020-08-25 — End: 2021-01-05

## 2020-08-25 NOTE — Patient Instructions (Addendum)
It was good to see you both again today.  I am glad you are feeling better after your remdesivir infusions.  Please continue to try to keep a set schedule for your sleep and take your melatonin at night.  Try to hydrate well with water, hydration is extremely important.  Hopefully, you will benefit from physical, speech, and occupational therapies.  As discussed, we will increase your levodopa to 2 pills 4 times a day, namely at 9 AM, 1 PM, 5 PM and 9 PM daily.  We will also add a new medication today and the family of dopamine agonists.  Even though you had side effects from ropinirole, there is a chance that you will do better with the patch called Neupro, generic name is rotigotine.  Start Neupro: (rotigotine patch) 2 mg/24 hour: Use 1 patch each day or at night.  Please stay with the same time every day.  Take the old patch off and put the new patch on.  If you find the adhesive to sticky, you can put some baby oil on the old patch and let it soak in the for you pull it off gently.    Common side effects reported are: Redness and itching around the patch, if you have severe local reaction or systemic skin reaction meaning involving wider areas of your skin with itching or hives or blisters, you have to discontinue this medicine immediately.  Other side effects include sedation, sleepiness, nausea, vomiting, and rare side effects are confusion, hallucinations, swelling in legs, and abnormal behaviors, including impulse control problems, which can manifest as excessive eating, obsessions with food or gambling, or hypersexuality.  Please follow-up as scheduled in June.  Call us anytime or email through [facility] with any interim questions or concerns you may have.  We can consider increasing the patch to 4 mg strength after about a month, before you fill it again.  Please let us know if you are ready to increase it, we can also stay with the 2 mg patch until your next appointment.

## 2020-08-25 NOTE — Progress Notes (Signed)
Subjective:    [name] ID: Matthew [name] is a 70 y.o. male.  HPI     Interim history:   Matthew [name] is a 70 year old right-handed gentleman with an underlying medical history of hypertension, reflux disease, arthritis with status post right total knee replacement, seasonal allergies, anxiety, and prostate cancer, status post prostate surgery in November 2019 and status post XRT in 2020, on Lupron inj, who presents for Follow-up consultation of his right-sided parkinsonism.  Matthew [name] is accompanied by his [name] again today and presents for a sooner than scheduled appointment. I last saw Matthew [name] on 04/21/2020, at which time Matthew [name] reported that Matthew [name] was able to tolerate levodopa, Matthew [name] was taking 2 pills 3 times daily.  Matthew [name] reported improvement in his tremor and mobility.  Matthew [name] had some dream enactment behavior for which Matthew [name] was advised to take melatonin.  Matthew [name] had ongoing issues with intermittent constipation.  Matthew [name] had reevaluations pending with neuro rehab in March 2022.   His [name] called in Matthew interim in February 2022 reporting that Matthew [name] was diagnosed with COVID.  They were advised to seek medical attention as Matthew [name] had symptoms.   Today, 08/25/2020: Matthew [name] reports feeling slower.  Matthew [name] has noticed stiffness in Matthew right shoulder and neck area and also some crunching when Matthew [name] rotates his head.  Matthew [name] is doing online classes for exercises which are primarily seated exercises but Matthew [name] has noticed some additional stiffness on his right side and neck area and shoulder area.  As far as his Covid symptoms, Matthew [name] started with congestion, Matthew [name] then had flulike symptoms including body aches and mild fever, sneezing and a cough, Matthew [name] did not have to be hospitalized and went to [facility] for 3 remdesivir infusions.  Matthew [name] did well with those and improved.  His [name] requested a sooner appointment because of more motor decline noted.  Matthew [name] agrees that Matthew [name] has had a more noticeable stoop.  Sometimes when Matthew [name] first starts walking Matthew [name] has smaller stutter steps.  Matthew [name]  continues to have sleep disruption primarily from nocturia.  Matthew [name] continues to take Myrbetriq for this.  Melatonin helps to some degree at night.  Matthew [name] tries to hydrate well but when Matthew [name] drinks water Matthew [name] has to go to Matthew bathroom more frequently.  Matthew [name] takes his Sinemet 3 times a day, 2 pills, at 9 AM, 1 PM, and 5 PM daily.  Matthew [name] does not have any telltale offer at all times.  Matthew [name] has had physical therapy through his urologist for pelvic floor exercises, Matthew [name] is supposed to start therapy through neuro rehab.  His [name] has noted some memory related concerns including mild forgetfulness and repeating himself, sometimes forgetting a conversation or something Matthew [name] said.  Slight mood irritability at times or disinhibition to where Matthew [name] would use expletive language which is not typical of Matthew [name].  Matthew [name] has not had any anger outbursts or depression or anxiety as such.  Matthew [name]'s allergies, current medications, family history, past medical history, past social history, past surgical history and problem list were reviewed and updated as appropriate.    Previously:      I saw Matthew [name] on 01/14/2020, at which time Matthew [name] felt about Matthew same.  Matthew [name] reported ongoing issues with urinary incontinence.  Matthew [name] had stopped taking melatonin for dream enactment behaviors.  Matthew [name] felt drowsy from it so Matthew [name] stopped it.  Matthew [name] had also stopped Requip because of sleepiness.  Matthew [name] was on Sinemet 1 pill 3 times daily without any telltale response noted.  Matthew [name]  was advised to increase it gradually to 2 pills 3 times daily.     I saw Matthew [name] on 10/14/2019, at which time Matthew [name] was off Matthew ropinirole.  Matthew [name] had side effects and these improved as Matthew [name] tapered off of it.  Matthew [name] had not had any recent falls but Matthew [name] did have a history of dream enactment behaviors and Matthew [name] was advised to start melatonin at night for sleep.  In addition, I suggested a trial of low-dose Sinemet generic with gradual increase.  I also advised Matthew [name] to seek evaluations through neuro rehab.  I made a referral to outpatient  PT, OT and speech therapy.   I first met Matthew [name] on 08/05/2019 at Matthew request of his primary care physician, at which time Matthew [name] reported an approximately 2 or 2+ year history of right-sided fine motor dyscontrol particularly affect is in his right hand when Matthew [name] was playing Matthew guitar.  His examination was in keeping with parkinsonism.  I suggested we consider medication in Matthew form of a dopamine agonist.  Matthew [name] was advised to proceed with a DaTscan. Matthew [name] called in Matthew interim on 08/20/2019, and was ready to start ropinirole with gradual titration. Matthew [name] had a DaTscan on 10/02/2019 and I reviewed Matthew results: IMPRESSION: Decreased radiotracer accumulation within Matthew LEFT and RIGHT striatum with faint retention of within Matthew heads of Matthew caudate nuclei. Matthew activity is decreased to a point that drug interference should be considered. 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   We called Matthew [name] with Matthew test results.    His [name] called in Matthew interim earlier this month because Matthew [name] had side effects with Matthew ropinirole and I advised Matthew [name] to taper off of Matthew medication.     08/05/19: (Matthew [name]) reports problems with his fine motor skills and coordination for Matthew past 2+ years.  Matthew [name] feels that his symptoms are primarily noted in Matthew right hand.  Matthew [name] has not had any tremors but has had difficulty with buttoning and also when playing Matthew guitar.  Matthew [name] has been playing Matthew guitar off and on for over 50 years.  Matthew [name] picked it up again after Matthew [name] retired some 2-1/2 years ago and noticed that Matthew [name] was having trouble.  Matthew [name] has had some issues with arthritis in Matthew hands and attributed most of his difficulty to arthritis.  Matthew [name] has noticed stiffness.  In addition, Matthew [name] has noticed slowness as an it takes Matthew [name] longer to do things.  Matthew [name] has had some balance problems but no recent fall with Matthew exception of a recent incident at home when Matthew [name] slipped off of a yoga exercise ball, Matthew [name] fell but did  not injure himself.  I reviewed your telemedicine note from 03/10/2019 as well as office note from 07/04/2018.  Matthew [name] denies any mood related issues or cognitive issues.  Matthew [name] sleeps fairly well but does have vivid dreams and reports acting out in his dreams from time to time.  Once Matthew [name] fell out of bed, his [name] has noted that Matthew [name] mumbles or talks in his sleep and also moves his limbs while asleep.  This has been going on for years as Matthew [name] recalls.  Matthew [name] does not smoke any cigarettes except for some smoking when Matthew [name] was a teenager reported.  Matthew [name] does smoke Matthew pipe, 1 or 2/day.  Matthew [name] drinks alcohol in Matthew form of some liquor, 1-2 servings per day on average.  Matthew [name] drinks caffeine in Matthew form of coffee.  Matthew [name] had  a knee replacement surgery last year.  Matthew [name] no longer takes any narcotic pain medication but does take ibuprofen 800 mg 2 or 3 times a day as needed.  Matthew [name] is on Lupron.   His Past Medical History Is Significant For: Past Medical History:  Diagnosis Date  . Anxiety   . Arthritis   . GERD (gastroesophageal reflux disease)    occ remote history  . Hypertension   . Prostate cancer ([place])   . Right knee pain    questionable meniscal tear  . Seasonal allergies     His Past Surgical History Is Significant For: Past Surgical History:  Procedure Laterality Date  . COLONOSCOPY    . Leg Laceration Repair  Left    Age 77  . PELVIC LYMPH NODE DISSECTION Bilateral 03/22/2018   Procedure: PELVIC LYMPH NODE DISSECTION;  Surgeon: Ceasar Mons, MD;  Location: [facility];  Service: Urology;  Laterality: Bilateral;  . PROSTATE BIOPSY    . ROBOT ASSISTED LAPAROSCOPIC RADICAL PROSTATECTOMY N/A 03/22/2018   Procedure: XI ROBOTIC ASSISTED LAPAROSCOPIC PROSTATECTOMY;  Surgeon: Ceasar Mons, MD;  Location: [facility];  Service: Urology;  Laterality: N/A;  . TOTAL KNEE ARTHROPLASTY Right 05/05/2019   Procedure: RIGHT TOTAL KNEE ARTHROPLASTY;  Surgeon: Frederik Pear, MD;  Location: [facility];  Service: Orthopedics;   Laterality: Right;    His Family History Is Significant For: Family History  Problem Relation Age of Onset  . Breast cancer Mother        30s  . Heart attack Father 70  . Colon cancer Maternal Grandmother   . Prostate cancer Neg Hx   . Pancreatic cancer Neg Hx     His Social History Is Significant For: Social History   Socioeconomic History  . Marital status: Married    Spouse name: Not on file  . Number of children: 2  . Years of education: Not on file  . Highest education level: Not on file  Occupational History  . Not on file  Tobacco Use  . Smoking status: Current Every Day Smoker    Years: 20.00    Types: Pipe  . Smokeless tobacco: Never Used  Vaping Use  . Vaping Use: Never used  Substance and Sexual Activity  . Alcohol use: Yes    Alcohol/week: 1.0 - 2.0 standard drink    Types: 1 - 2 Shots of liquor per week    Comment: per day  . Drug use: Never  . Sexual activity: Not Currently  Other Topics Concern  . Not on file  Social History Narrative  . Not on file   Social Determinants of Health   Financial Resource Strain: Not on file  Food Insecurity: Not on file  Transportation Needs: Not on file  Physical Activity: Not on file  Stress: Not on file  Social Connections: Not on file    His Allergies Are:  Allergies  Allergen Reactions  . Other     Senior dose of flu shot, cause hear palpitations, anxious  . Peanut-Containing Drug Products Other (See Comments)    Nasal congestion  :   His Current Medications Are:  Outpatient Encounter Medications as of 08/25/2020  Medication Sig  . carbidopa-levodopa (SINEMET IR) 25-100 MG tablet Take 2 tablets by mouth 3 (three) times daily. 2 pills 3 times a day  . fluticasone (FLONASE) 50 MCG/ACT nasal spray Place 1-2 sprays into both nostrils daily as needed for allergies or rhinitis.  Marland Kitchen ibuprofen (ADVIL) 800 MG tablet Take 800  mg by mouth in Matthew morning and at bedtime.  Marland Kitchen lisinopril (ZESTRIL) 20 MG tablet Take  20 mg by mouth daily.  Marland Kitchen loratadine (CLARITIN) 10 MG tablet Take 10 mg by mouth daily.  . mirabegron ER (MYRBETRIQ) 50 MG TB24 tablet Take 50 mg by mouth daily.  Marland Kitchen OVER Matthew COUNTER MEDICATION Take 4-6 tablets by mouth in Matthew morning, at noon, and at bedtime. RESTORE GOLD 6 tablet in am, 6 tab at lunch and 4 tablet in evening  . [DISCONTINUED] leuprolide, 6 Month, (ELIGARD) 45 MG injection Inject 45 mg into Matthew skin every 6 (six) months.   No facility-administered encounter medications on file as of 08/25/2020.  :  Review of Systems:  Out of a complete 14 point review of systems, all are reviewed and negative with Matthew exception of these symptoms as listed below: Review of Systems  Neurological:       Rm 1. Here for increased tremors/moving very slow.    Objective:  Neurological Exam  Physical Exam Physical Examination:   Vitals:   08/25/20 0722  BP: 115/73  Pulse: (!) 55    General Examination: Matthew [name] is a very pleasant 70 y.o. male in no acute distress. Matthew [name] appears well-developed and well-nourished and well groomed.   HEENT:Normocephalic, atraumatic, pupils are equal, round and reactive to light,moderate nuchal rigidity, extraocular tracking is mildly impaired. Matthew [name] has mild to moderate facial masking with decrease in eye blink rate. Hearing is intact, no significant leaning of his upper body noted. Matthew [name] has no significant lip or jaw tremor. Matthew [name] has mild to moderate hypophonia, slight raspiness in his voice today. Airway examination reveals no significant mouth dryness, no significant drooling, tongue protrudes centrally and palate elevates symmetrically.   Chest:Clear to auscultation without wheezing, rhonchi or crackles noted.  Heart:S1+S2+0, regular and normal without murmurs, rubs or gallops noted.   Abdomen:Soft, non-tender and non-distended with normal bowel sounds appreciated on auscultation.  Extremities:There istraceedema in Matthew R ankle, stable.  Skin:  Warm and dry without trophic changes noted.  Musculoskeletal: exam revealsunremarkable right knee replacement scar.   Neurologically:  Mental status: Matthew [name] is awake, alert and oriented in all 4 spheres.Hisimmediate and remote memory, attention, language skills and fund of knowledge are appropriate. There is no evidence of aphasia, agnosia, apraxia or anomia. Thought process is linear. Mood is normaland affect is normal.  Slight slowness in his thinking at times. Cranial nerves II - XII are as described above under HEENT exam. In addition: shoulder shrug is normal with equal shoulder height noted. Motor exam: Normal bulk, strength. There is no drift,resting, postural or actiontremor.  (On3/16/2021:On Archimedes spiral drawing Matthew [name] has no significant difficulty with Matthew right hand, slight insecurity noted with Matthew left hand, handwriting is legible for Matthew most part but micrographic.)  Fine motor skills and coordination:Matthew [name] has fine motor difficulty bilaterally, right more than left, right in Matthew moderate range, left in Matthew mild to moderate range,.Matthew [name] has moderate bradykinesia. No dyskinesias noted. Tone is increased in both upper extremities. Gait,station and balance:Matthew [name] stands up with mild difficulty and pushes himself up. Posture ismildly stooped,  could be slightly worse, Matthew [name] has decreased arm swing bilaterally, right more so than left. No obvious shuffling noted.  Cerebellar testing: No dysmetria or intention tremor. There is no truncal or gait ataxia.  Sensory exam: intact to light touch in Matthew upper and lower extremities.  Assessmentand Plan:   In summary,Matthew Hiltonis a very pleasant 59 year oldmalewith an underlying medical history  of hypertension, reflux disease, arthritiswith status post right total knee replacement, seasonal allergies, anxiety, and prostate cancer, status post prostate surgery in November 2019 and status post XRT in 2020, whopresents for  a sooner than scheduled appointment for follow-up of his parkinsonism with symptoms dating back about 2-1/2 years, lateralization noted to Matthew right.  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  Matthew [name] had noted some benefit from ropinirole but side effects outweighed Matthew benefits - Matthew [name] had significant exacerbation of his urinary incontinence and severe somnolence from it.  Matthew [name] is on Lupron injections. Matthew [name] is in close follow-up with urology.  Matthew [name] has significant nocturia.  Constipation has been an issue but Matthew [name] is proactive about it and takes Colace twice daily and Metamucil as needed. Matthew [name] is advised to stay well-hydrated and continue to try to stay active physically. Matthew [name] tries to stay mentally active.We will monitor for cognitive symptoms and for mood related symptoms.  Matthew [name] had recent evaluations through neuro rehab and is going to start therapy.  Matthew [name] has previously had therapy which was beneficial.  Matthew [name] has been on Sinemet, which we increased from 1 pill 3 times daily to 2 pills three times a day gradually, after our visit in August 2021. Matthew [name] was advised to take melatonin consistently either 3 mg or up to 6 mg about 2 hours before his bedtime.  Matthew [name] has benefited from Matthew increase in levodopa but does not have telltale response such as on time or off time.  Matthew [name] has had decline in his motor symptoms.  Matthew [name] had Covid in February 2022 and was treated successfully with remdesivir infusions.  At this juncture, Matthew [name] is advised to increase his Sinemet to 2 pills 4 times a day, at 9 AM, 1 PM, 5 PM and 9 PM daily.  Furthermore, I would like to try Matthew [name] on low-dose Neupro patch.  We did talk about dopamine agonists and side effects.  While Matthew [name] had side effects on ropinirole, Matthew [name] may do better with Matthew patch form.  Matthew [name] is agreeable to trying it, Matthew [name] will start with Matthew 2 mg patch once a day.  We can increase this to Matthew 4 mg patch in a month before  his next refill or we can consider increasing this at his next visit.  Matthew [name] had a scheduled appointment on file for June which we decided to keep.  I answered all their questions today and Matthew [name] were in agreement.   I spent 40 minutes in total face-to-face time and in reviewing records during pre-charting, more than 50% of which was spent in counseling and coordination of care, reviewing test results, reviewing medications and treatment regimen and/or in discussing or reviewing Matthew diagnosis of PD, Matthew prognosis and treatment options. Pertinent laboratory and imaging test results that were available during this visit with Matthew [name] were reviewed by me and considered in my medical decision making (see chart for details).

## 2020-08-25 NOTE — Progress Notes (Deleted)
Subjective:    Patient ID: Matthew [name] is a 70 y.o. male.  HPI {Common ambulatory SmartLinks:[id number]}  Review of Systems  Neurological:       Pt and his wife stated at the recent visit in December he was doing so well but since that has had noticeable decline in health. Wife states that when taking the carbidopa levodopa they don't see a difference between the "on" and "off" periods. Not noticing the medication working. States that he has had worsening in balance. He is walking and talking slower. Overall decline in general    Objective:  Neurological Exam  Physical Exam  Assessment:   ***  Plan:   ***

## 2020-08-29 ENCOUNTER — Encounter (HOSPITAL_COMMUNITY): Payer: Self-pay | Admitting: Emergency Medicine

## 2020-08-29 ENCOUNTER — Other Ambulatory Visit: Payer: Self-pay

## 2020-08-29 ENCOUNTER — Emergency Department (HOSPITAL_COMMUNITY)
Admission: EM | Admit: 2020-08-29 | Discharge: 2020-08-30 | Disposition: A | Discharge status: Home / Self Care | Payer: PPO | Attending: Emergency Medicine | Admitting: Emergency Medicine

## 2020-08-29 DIAGNOSIS — W01198A Fall on same level from slipping, tripping and stumbling with subsequent striking against other object, initial encounter: Secondary | ICD-10-CM | POA: Insufficient documentation

## 2020-08-29 DIAGNOSIS — Z96651 Presence of right artificial knee joint: Secondary | ICD-10-CM | POA: Diagnosis not present

## 2020-08-29 DIAGNOSIS — Y92019 Unspecified place in single-family (private) house as the place of occurrence of the external cause: Secondary | ICD-10-CM | POA: Insufficient documentation

## 2020-08-29 DIAGNOSIS — Z23 Encounter for immunization: Secondary | ICD-10-CM | POA: Diagnosis not present

## 2020-08-29 DIAGNOSIS — F17210 Nicotine dependence, cigarettes, uncomplicated: Secondary | ICD-10-CM | POA: Diagnosis not present

## 2020-08-29 DIAGNOSIS — Z79899 Other long term (current) drug therapy: Secondary | ICD-10-CM | POA: Diagnosis not present

## 2020-08-29 DIAGNOSIS — S0991XA Unspecified injury of ear, initial encounter: Secondary | ICD-10-CM | POA: Diagnosis present

## 2020-08-29 DIAGNOSIS — Z91010 Allergy to peanuts: Secondary | ICD-10-CM | POA: Insufficient documentation

## 2020-08-29 DIAGNOSIS — S01312A Laceration without foreign body of left ear, initial encounter: Secondary | ICD-10-CM | POA: Insufficient documentation

## 2020-08-29 DIAGNOSIS — G20 Parkinson's disease: Secondary | ICD-10-CM | POA: Insufficient documentation

## 2020-08-29 DIAGNOSIS — Z8546 Personal history of malignant neoplasm of prostate: Secondary | ICD-10-CM | POA: Diagnosis not present

## 2020-08-29 DIAGNOSIS — I10 Essential (primary) hypertension: Secondary | ICD-10-CM | POA: Insufficient documentation

## 2020-08-29 HISTORY — DX: Parkinson's disease: G20

## 2020-08-29 HISTORY — DX: Parkinson's disease without dyskinesia, without mention of fluctuations: G20.A1

## 2020-08-29 MED ORDER — BUPIVACAINE HCL 0.5 % IJ SOLN: 10.0000 mL | INTRAMUSCULAR | Status: DC

## 2020-08-29 MED ORDER — BUPIVACAINE HCL (PF) 0.5 % IJ SOLN
10.0000 mL | Freq: Once | INTRAMUSCULAR | Status: AC
  Administered 2020-08-30: 10 mL
  Filled 2020-08-29: qty 30

## 2020-08-29 MED ORDER — TETANUS-DIPHTH-ACELL PERTUSSIS 5-2.5-18.5 LF-MCG/0.5 IM SUSY
0.5000 mL | PREFILLED_SYRINGE | Freq: Once | INTRAMUSCULAR | Status: AC
  Administered 2020-08-30: 0.5 mL via INTRAMUSCULAR
  Filled 2020-08-29: qty 0.5

## 2020-08-29 NOTE — ED Triage Notes (Signed)
Patient reports trip and fall, hitting left ear on piece of furniture. Laceration to left ear. Bleeding controlled. Unknown last tetanus. Denies LOC.

## 2020-08-29 NOTE — ED Provider Notes (Signed)
[facility] DEPT Provider Note   CSN: [id number] Arrival date & time: 08/29/20  2036     History Chief Complaint  Patient presents with  . Fall  . Laceration    Matthew [name] is a 70 y.o. male.   70 year old male with a history of hypertension, esophageal reflux, Parkinson's disease presents to the emergency department for laceration to the left ear.  Patient was helping to move items within his home when he tripped on an object on the ground and fell sideways into a piece of furniture.  Sustained laceration of the left ear.  He had no loss of consciousness.  Denies nausea, vomiting, headache.  Not chronically anticoagulated.  The history is provided by the patient. No language interpreter was used.  Fall  Laceration      Past Medical History:  Diagnosis Date  . Anxiety   . Arthritis   . GERD (gastroesophageal reflux disease)    occ remote history  . Hypertension   . Parkinson disease ([place])   . Prostate cancer ([place])   . Right knee pain    questionable meniscal tear  . Seasonal allergies     Patient Active Problem List   Diagnosis Date Noted  . S/P TKR (total knee replacement), right 05/05/2019  . Osteoarthritis of right knee 05/02/2019  . Prostate cancer ([place]) 03/22/2018  . Malignant neoplasm of prostate ([place]) 02/27/2018    Past Surgical History:  Procedure Laterality Date  . COLONOSCOPY    . Leg Laceration Repair  Left    Age 45  . PELVIC LYMPH NODE DISSECTION Bilateral 03/22/2018   Procedure: PELVIC LYMPH NODE DISSECTION;  Surgeon: Ceasar Mons, MD;  Location: [facility];  Service: Urology;  Laterality: Bilateral;  . PROSTATE BIOPSY    . ROBOT ASSISTED LAPAROSCOPIC RADICAL PROSTATECTOMY N/A 03/22/2018   Procedure: XI ROBOTIC ASSISTED LAPAROSCOPIC PROSTATECTOMY;  Surgeon: Ceasar Mons, MD;  Location: [facility];  Service: Urology;  Laterality: N/A;  . TOTAL KNEE ARTHROPLASTY Right 05/05/2019   Procedure:  RIGHT TOTAL KNEE ARTHROPLASTY;  Surgeon: Frederik Pear, MD;  Location: [facility];  Service: Orthopedics;  Laterality: Right;       Family History  Problem Relation Age of Onset  . Breast cancer Mother        3s  . Heart attack Father 60  . Colon cancer Maternal Grandmother   . Prostate cancer Neg Hx   . Pancreatic cancer Neg Hx     Social History   Tobacco Use  . Smoking status: Current Every Day Smoker    Years: 20.00    Types: Pipe  . Smokeless tobacco: Never Used  Vaping Use  . Vaping Use: Never used  Substance Use Topics  . Alcohol use: Yes    Alcohol/week: 1.0 - 2.0 standard drink    Types: 1 - 2 Shots of liquor per week    Comment: per day  . Drug use: Never    Home Medications Prior to Admission medications   Medication Sig Start Date End Date Taking? Authorizing Provider  carbidopa-levodopa (SINEMET IR) 25-100 MG tablet Take 2 tablets by mouth 4 (four) times daily. 2 pills 3 times a day 08/25/20   Star Age, MD  fluticasone (FLONASE) 50 MCG/ACT nasal spray Place 1-2 sprays into both nostrils daily as needed for allergies or rhinitis.    [provider]  ibuprofen (ADVIL) 800 MG tablet Take 800 mg by mouth in the morning and at bedtime.    [provider]  lisinopril (ZESTRIL) 20 MG tablet Take 20 mg by mouth daily.    [provider]  loratadine (CLARITIN) 10 MG tablet Take 10 mg by mouth daily.    [provider]  mirabegron ER (MYRBETRIQ) 50 MG TB24 tablet Take 50 mg by mouth daily.    [provider]  OVER THE COUNTER MEDICATION Take 4-6 tablets by mouth in the morning, at noon, and at bedtime. RESTORE GOLD 6 tablet in am, 6 tab at lunch and 4 tablet in evening    [provider]  rotigotine (NEUPRO) 2 MG/24HR Place 1 patch onto the skin daily. 08/25/20   Star Age, MD    Allergies    Other and Peanut-containing drug products  Review of Systems   Review of Systems  Skin: Positive for wound.  Ten systems  reviewed and are negative for acute change, except as noted in the HPI.    Physical Exam Updated Vital Signs BP (!) 152/95 (BP Location: Right Arm)   Pulse 80   Temp 98.2 F (36.8 C) (Oral)   Resp 18   Ht 5\' 6"  (1.676 m)   Wt 74.8 kg   SpO2 96%   BMI 26.63 kg/m   Physical Exam Vitals and nursing note reviewed.  Constitutional:      General: He is not in acute distress.    Appearance: He is well-developed. He is not diaphoretic.     Comments: Nontoxic-appearing and in no acute distress  HENT:     Head: Normocephalic.     Comments: No battle sign or raccoon's eyes    Right Ear: External ear normal.     Ears:      Comments: Laceration of the left ear through the helix which extends medially. Bleeding controlled. Eyes:     General: No scleral icterus.    Conjunctiva/sclera: Conjunctivae normal.  Pulmonary:     Effort: Pulmonary effort is normal. No respiratory distress.     Comments: Respirations even and unlabored Musculoskeletal:        General: Normal range of motion.     Cervical back: Normal range of motion.  Skin:    General: Skin is warm and dry.     Coloration: Skin is not pale.     Findings: No erythema or rash.  Neurological:     Mental Status: He is alert and oriented to person, place, and time.     Coordination: Coordination normal.  Psychiatric:        Behavior: Behavior normal.     ED Results / Procedures / Treatments   Labs (all labs ordered are listed, but only abnormal results are displayed) Labs Reviewed - No data to display  EKG None  Radiology No results found.  Procedures .Marland KitchenLaceration Repair  Date/Time: 08/30/2020 12:06 AM Performed by: Antonietta Breach, PA-C Authorized by: Antonietta Breach, PA-C   Consent:    Consent obtained:  Verbal   Consent given by:  Patient   Risks, benefits, and alternatives were discussed: yes     Risks discussed:  Need for additional repair, poor cosmetic result, poor wound healing and pain Universal protocol:     Patient identity confirmed:  Verbally with patient and arm band Anesthesia:    Anesthesia method:  Local infiltration   Local anesthetic:  Bupivacaine 0.5% w/o epi Laceration details:    Location:  Ear   Ear location:  L ear   Length (cm):  2   Depth (mm):  1 Exploration:  Hemostasis achieved with:  Direct pressure   Contaminated: no   Treatment:    Area cleansed with:  Povidone-iodine   Amount of cleaning:  Standard   Irrigation method:  Tap   Debridement:  None   Scar revision: no   Skin repair:    Repair method:  Sutures   Suture size:  6-0   Suture material:  Prolene   Suture technique:  Simple interrupted   Number of sutures:  5 Approximation:    Approximation:  Close Repair type:    Repair type:  Intermediate Post-procedure details:    Dressing:  Open (no dressing)   Procedure completion:  Tolerated well, no immediate complications     Medications Ordered in ED Medications  Tdap (BOOSTRIX) injection 0.5 mL (has no administration in time range)  bupivacaine (MARCAINE) 0.5 % injection 10 mL (has no administration in time range)    ED Course  I have reviewed the triage vital signs and the nursing notes.  Pertinent labs & imaging results that were available during my care of the patient were reviewed by me and considered in my medical decision making (see chart for details).    MDM Rules/Calculators/A&P                          Tdap booster given. Laceration occurred < 8 hours prior to repair which was well tolerated. Pt has no comorbidities to effect normal wound healing. Discussed suture home care with pt and answered questions. Pt to follow up for wound check and suture removal in 7-10 days. Patient is hemodynamically stable with no complaints prior to discharge.     Final Clinical Impression(s) / ED Diagnoses Final diagnoses:  Laceration of helix of left ear, initial encounter    Rx / DC Orders ED Discharge Orders    None       Antonietta Breach,  PA-C 08/30/20 0009    Mesner, Corene Cornea, MD 08/30/20 0320

## 2020-08-30 NOTE — ED Notes (Addendum)
Discharged no concerns at this time. Ambulatory and left with wife.

## 2020-08-30 NOTE — Discharge Instructions (Addendum)
Avoid soaking your wound in stagnant or dirty water such as while taking a bath. You can shower normally. Keep the area clean with mild soap and warm water. Do not apply peroxide or alcohol to your wound as this can break down newly forming skin and prolong wound healing. If you keep the area bandaged, change the dressing/bandage at least once per day. Have your staples/sutures removed in 7-10 days.

## 2020-09-06 DIAGNOSIS — Z4802 Encounter for removal of sutures: Secondary | ICD-10-CM | POA: Diagnosis not present

## 2020-09-16 ENCOUNTER — Encounter: Payer: Self-pay | Admitting: Physical Therapy

## 2020-09-16 ENCOUNTER — Other Ambulatory Visit: Payer: Self-pay

## 2020-09-16 ENCOUNTER — Ambulatory Visit: Payer: PPO | Admitting: Occupational Therapy

## 2020-09-16 ENCOUNTER — Ambulatory Visit: Payer: PPO | Attending: Neurology | Admitting: Physical Therapy

## 2020-09-16 ENCOUNTER — Ambulatory Visit: Payer: PPO

## 2020-09-16 ENCOUNTER — Encounter: Payer: Self-pay | Admitting: Occupational Therapy

## 2020-09-16 DIAGNOSIS — R278 Other lack of coordination: Secondary | ICD-10-CM | POA: Insufficient documentation

## 2020-09-16 DIAGNOSIS — R471 Dysarthria and anarthria: Secondary | ICD-10-CM | POA: Diagnosis not present

## 2020-09-16 DIAGNOSIS — R2689 Other abnormalities of gait and mobility: Secondary | ICD-10-CM | POA: Diagnosis not present

## 2020-09-16 DIAGNOSIS — R293 Abnormal posture: Secondary | ICD-10-CM | POA: Insufficient documentation

## 2020-09-16 DIAGNOSIS — R29898 Other symptoms and signs involving the musculoskeletal system: Secondary | ICD-10-CM

## 2020-09-16 DIAGNOSIS — R1310 Dysphagia, unspecified: Secondary | ICD-10-CM | POA: Insufficient documentation

## 2020-09-16 DIAGNOSIS — R29818 Other symptoms and signs involving the nervous system: Secondary | ICD-10-CM

## 2020-09-16 DIAGNOSIS — M6281 Muscle weakness (generalized): Secondary | ICD-10-CM | POA: Diagnosis not present

## 2020-09-16 DIAGNOSIS — R2681 Unsteadiness on feet: Secondary | ICD-10-CM | POA: Insufficient documentation

## 2020-09-16 NOTE — Therapy (Addendum)
[facility] [address] [place], [place], [address] Phone: [phone]   Fax:  [phone]  Speech Language Pathology Evaluation  Patient Details  Name: Matthew [name] MRN: [id number] Date of Birth: 1950/08/11 Referring Provider (SLP): Dr. Rexene Alberts   Encounter Date: 09/16/2020   End of Session - 09/16/20 1654    Visit Number 1    Number of Visits 17    Date for SLP Re-Evaluation 12/15/20    SLP Start Time 1450    SLP Stop Time  3419    SLP Time Calculation (min) 37 min    Activity Tolerance Patient tolerated treatment well           Past Medical History:  Diagnosis Date  . Anxiety   . Arthritis   . GERD (gastroesophageal reflux disease)    occ remote history  . Hypertension   . Parkinson disease ([place])   . Prostate cancer ([place])   . Right knee pain    questionable meniscal tear  . Seasonal allergies     Past Surgical History:  Procedure Laterality Date  . COLONOSCOPY    . Leg Laceration Repair  Left    Age 19  . PELVIC LYMPH NODE DISSECTION Bilateral 03/22/2018   Procedure: PELVIC LYMPH NODE DISSECTION;  Surgeon: Ceasar Mons, MD;  [facility]: [facility];  Service: Urology;  Laterality: Bilateral;  . PROSTATE BIOPSY    . ROBOT ASSISTED LAPAROSCOPIC RADICAL PROSTATECTOMY N/A 03/22/2018   Procedure: XI ROBOTIC ASSISTED LAPAROSCOPIC PROSTATECTOMY;  Surgeon: Ceasar Mons, MD;  Location: [facility];  Service: Urology;  Laterality: N/A;  . TOTAL KNEE ARTHROPLASTY Right 05/05/2019   Procedure: RIGHT TOTAL KNEE ARTHROPLASTY;  Surgeon: Frederik Pear, MD;  Location: [facility];  Service: Orthopedics;  Laterality: Right;    There were no vitals filed for this visit.   Subjective Assessment - 09/16/20 1454    Subjective "Wife says she can't understand me very well sometimes." "Pronounciation sometimes seems weird." "Every once in awhile (saliva) will drip onto my beard."    Currently in Pain? No/denies               SLP Evaluation [facility] - 09/16/20 1454      SLP Visit Information   SLP Received On 09/16/20    Referring Provider (SLP) Dr. Rexene Alberts    Onset Date 2021    Medical Diagnosis primary parkinsonism      Subjective   Subjective "(The drooling) seems to come about when I'm not thinking about it"    Patient/Family Stated Goal more consistent with my speech volume, reduce the incidence of drooling,      General Information   HPI Matthew [name] is a 70 year old male with past medical history of hypertension, reflux disease, arthritis s/p right total knee replacement, seasonal allergies, anxiety, and prostate cancer s/p prostate surgery in November 2019 and XRT in 2020, referred by Dr. Rexene Alberts for speech therapy evaluation due to Parkinsonism. Reports onset of symptoms in 2019. He had a course of therapy here last summer and has not completed HEP as he was directed at d/c last summer.      Prior Functional Status   Cognitive/Linguistic Baseline Within functional limits      Cognition   Overall Cognitive Status Within Functional Limits for tasks assessed      Auditory Comprehension   Overall Auditory Comprehension Appears within functional limits for tasks assessed      Verbal Expression   Overall Verbal Expression Appears  within functional limits for tasks assessed      Motor Speech   Overall Motor Speech Impaired    Respiration Impaired   chest breather   Level of Impairment Sentence    Phonation Low vocal intensity   upper 60s dB in 10 minutes simple-mod complex conversation   Intelligibility Intelligible    Effective Techniques Increased vocal intensity   loud /a/=average low-mid 80s dB; in simple-mod complex conversation following loud /a/ pt maintained low 70s average for 2 minutes and then req'd min nonverbal cues for maintaining loudness                                SLP Short Term Goals - 09/16/20 1651      SLP SHORT TERM GOAL #1   Title pt will  produce /a/ with average upper 80s dB at 30 cm over 3 sessions    Time 4    Period Weeks    Status New     SLP SHORT TERM GOAL #2   Title pt will demo abdominal breathing with sentences 90% accuracy x 3 sessions    Time 4    Period Weeks    Status New     SLP SHORT TERM GOAL #3   Title pt will generate 8 minutes simple conversation with low 70s dB average in 3 sessions    Time 4    Period Weeks    Status New     SLP SHORT TERM GOAL #4   Title pt will complete clinical swallowing assessment if indicated.    Time 2    Period Weeks    Status New     SLP SHORT TERM GOAL #5   Title pt will undergo objective swallow assessment (MBSS or FEES) if clinically indicated    Time 4    Period Weeks    Status New            SLP Long Term Goals - 09/16/20 1652      SLP LONG TERM GOAL #1   Title pt will use abdominal breathing >80% of the time during 10 minute simple conversation in 3 sessions    Time 8    Period Weeks   or 17 total visits, for all LTGs   Status New      SLP LONG TERM GOAL #2   Title pt will maintain WNL vocal quality/intensity in 10 minute simple conversation outside [place] room x 3 sessions    Time 8    Period Weeks    Status New      SLP LONG TERM GOAL #3   Title pt will report fewer requests to repeat himself than prior to ST    Time 8    Period Weeks    Status New      SLP LONG TERM GOAL #4   Title pt will tell SLP 3 overt s/s difficulty oropharyngeal dysphagia with modified independence    Time 8    Period Weeks    Status New     SLP LONG TERM GOAL #5   Title pt will report less frequent drooling than prior to eval    Time 8    Period Weeks    Status New            Plan - 09/16/20 1655    Clinical Impression Statement Patient presents with 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 He reports increase in requests to repeat himself over the past 9 months  - he has not completed loud /a/ like he would have liked since d/c in September 2021. Intelligibility was not affected for this listener in a quiet environment. He reports occasional difficulties with rt>lt labial leakage, and with 1-2 instances/week of coughing with water. SLP to monitor swallow function and perform bedside swallow eval PRN and pt may require objective swallow eval at some point. I recommend skilled ST to address communication and if necessary, dysphagia, in order to improve pt's communication and quality of life.    Speech Therapy Frequency 2x / week    Duration 8 weeks   or 17 visits   Treatment/Interventions Aspiration precaution training;Language facilitation;Environmental controls;Cueing hierarchy;SLP instruction and feedback;Compensatory techniques;Functional tasks;Compensatory strategies;Patient/family education;Diet toleration management by SLP;Other (comment)   MBS if warranted   Potential to Achieve Goals Good    Consulted and Agree with Plan of Care Patient           Patient will benefit from skilled therapeutic intervention in order to improve the following deficits and impairments:   Dysarthria and anarthria  Dysphagia, unspecified type    Problem List Patient Active Problem List   Diagnosis Date Noted  . S/P TKR (total knee replacement), right 05/05/2019  . Osteoarthritis of right knee 05/02/2019  . Prostate cancer ([place]) 03/22/2018  . Malignant neoplasm of prostate ([place]) 02/27/2018    [facility] ,[place], [place]  09/16/2020, 4:59 PM  [facility] [address] [place], [place], [address] Phone: [phone]   Fax:  [phone]  Name: Matthew [name] MRN: [id number] Date of Birth: [birth year]

## 2020-09-16 NOTE — Therapy (Signed)
[facility] [address] [place], [place], [address] Phone: [phone]   Fax:  [phone]  Occupational Therapy Evaluation  Patient Details  Name: Matthew [name] MRN: [id number] Date of Birth: 08-Apr-1951 Referring Provider (OT): Dr. Rexene Alberts   Encounter Date: 09/16/2020   OT End of Session - 09/16/20 1552    Visit Number 1    Number of Visits 17    Date for OT Re-Evaluation 11/18/20    Authorization Type [employer]    Authorization Time Period POC writtent for 8 weeks pt scheduled for 6 weeks    Authorization - Visit Number 1    Authorization - Number of Visits 10    OT Start Time 1320    OT Stop Time 1400    OT Time Calculation (min) 40 min    Activity Tolerance Patient tolerated treatment well    Behavior During Therapy [facility] for tasks assessed/performed           Past Medical History:  Diagnosis Date  . Anxiety   . Arthritis   . GERD (gastroesophageal reflux disease)    occ remote history  . Hypertension   . Parkinson disease ([place])   . Prostate cancer ([place])   . Right knee pain    questionable meniscal tear  . Seasonal allergies     Past Surgical History:  Procedure Laterality Date  . COLONOSCOPY    . Leg Laceration Repair  Left    Age 70  . PELVIC LYMPH NODE DISSECTION Bilateral 03/22/2018   Procedure: PELVIC LYMPH NODE DISSECTION;  Surgeon: Ceasar Mons, MD;  Location: [facility];  Service: Urology;  Laterality: Bilateral;  . PROSTATE BIOPSY    . ROBOT ASSISTED LAPAROSCOPIC RADICAL PROSTATECTOMY N/A 03/22/2018   Procedure: XI ROBOTIC ASSISTED LAPAROSCOPIC PROSTATECTOMY;  Surgeon: Ceasar Mons, MD;  Location: [facility];  Service: Urology;  Laterality: N/A;  . TOTAL KNEE ARTHROPLASTY Right 05/05/2019   Procedure: RIGHT TOTAL KNEE ARTHROPLASTY;  Surgeon: Frederik Pear, MD;  Location: [facility];  Service: Orthopedics;  Laterality: Right;    There were no vitals filed for this  visit.   Subjective Assessment - 09/16/20 1551    Subjective  Pt reports that MD modified meds    Pertinent History PMH:  PD newly diagnosed , anxiety, arthritis, GERD, prostate cancer, HTN, s/p R TKR 04/2019    Limitations hx of CA, use caution with modalities.    Patient Stated Goals improve ADL performance, balance    Currently in Pain? No/denies             [facility] OT Assessment - 09/16/20 1611      Assessment   Medical Diagnosis Primary Parkinsonism    Referring Provider (OT) Dr. Rexene Alberts    Onset Date/Surgical Date 08/10/20    Hand Dominance Right    Prior Therapy OT, PT, ST      Precautions   Precautions Fall      Balance Screen   Has the patient fallen in the past 6 months Yes    How many times? 1    Has the patient had a decrease in activity level because of a fear of falling?  No    Is the patient reluctant to leave their home because of a fear of falling?  No      Home  Environment   Family/patient expects to be discharged to: Private residence    [place] Two level  Lives With Spouse      Prior Function   Level of Independence Independent    Vocation Retired    Leisure Enjoys being with guy friends playing horsehoes and darts; does HEP from post TKR; enjoys going to the pool, spending time with grandchildren, enjoys Proofreader, live music.  Sometimes walks in the neighborhood.      ADL   Eating/Feeding Needs assist with cutting food   increased spills   Grooming Modified independent   mild difficulty with combing hair   Upper Body Bathing Modified independent    Lower Body Bathing Increased time    Upper Body Dressing Needs assist for fasteners;Minimal assistance    Lower Body Dressing Modified independent    Toilet Transfer Modified independent    Toileting -  Hygiene Increase time   difficulty with hygeine   Tub/Shower Transfer Modified independent      IADL   Shopping Shops independently for small purchases    Light  Housekeeping Performs light daily tasks such as dishwashing, bed making    Meal Prep Able to complete simple cold meal and snack prep    Medication Management Is responsible for taking medication in correct dosages at correct time    Financial Management Dependent   wife handled before     Mobility   Mobility Status Independent;History of falls      Written Expression   Handwriting Mild micrographia;100% legible      Cognition   Overall Cognitive Status Within Functional Limits for tasks assessed      Observation/Other Assessments   Simulated Eating Time (seconds) 14.38    Donning Doffing Jacket Time (seconds) 32.35    Donning Doffing Jacket Comments 3 button/ unbutton 34.09      Posture/Postural Control   Posture/Postural Control Postural limitations    Postural Limitations Rounded Shoulders;Forward head;Posterior pelvic tilt;Flexed trunk      Sensation   Light Touch Appears Intact      Coordination   Gross Motor Movements are Fluid and Coordinated No    Fine Motor Movements are Fluid and Coordinated No    9 Hole Peg Test Right;Left    Right 9 Hole Peg Test 38.0    Left 9 Hole Peg Test 32.32    Box and Blocks LUE 42,   need to test right next visit   Coordination impaired for fine motor coordiantion for RUE      Tone   Assessment Location Right Upper Extremity;Left Upper Extremity      AROM   Overall AROM  Deficits    Overall AROM Comments RUE shoulder flexion 110, elbow ext -10 LUE shoulder flexion 110, elbow ext -10, mildly decreased supination and wrist ext for RUE      RUE Tone   RUE Tone Mild   rigidity, greater in RUe than left     LUE Tone   LUE Tone Mild   rigidity                            OT Short Term Goals - 09/16/20 1601      OT SHORT TERM GOAL #1   Title I with PD specific HEP    Time 4    Period Weeks    Status New    Target Date 10/21/20      OT SHORT TERM GOAL #2   Title Pt will verbalize understanding of adapted  strategies to maximize safety and I with  ADLs/ IADLs .    Time 4    Period Weeks    Status New      OT SHORT TERM GOAL #3   Title Test box/ blocks and set goal prn    Baseline LUE 42 blocks , need to re-test RUE    Time 4    Period Weeks    Status New   45     OT SHORT TERM GOAL #4   Title Pt will demonstrate increased ease with dressing as evidenced by decreasing PPT#4(don/ doff jacket) to 25 secs or less    Baseline 32.35    Time 4    Period Weeks    Status New      OT SHORT TERM GOAL #5   Title Pt will retrieve a lightweight object from overhead shelf at 120 shoulder flexion with left and right UE's    Time 4    Period Weeks    Status New               OT Long Term Goals - 09/16/20 1603      OT LONG TERM GOAL #1   Title Pt will verbalize understanding of ways to prevent future PD related complications and PD community resources following review    Time 8    Period Weeks    Status New    Target Date 11/18/20      OT LONG TERM GOAL #2   Title Pt will demonstrate improved fine motor coordination for ADLs as evidenced by decreasing 9 hole peg test score for RUE by 3 secs    Baseline 38    Time 8    Period Weeks    Status New      OT LONG TERM GOAL #3   Title Pt will demonstrate improved ease with dressing as eveidenced by decreasing 3 button/ unbutton time to 30 secs or less    Baseline 34.09    Time 8    Period Weeks    Status Achieved      OT LONG TERM GOAL #4   Title Pt will report pain in right shoulder no greater than 2/10 for ADLs    Time 8    Period Weeks    Status New      OT LONG TERM GOAL #5   Title .    Time --    Period --                 Plan - 09/16/20 1557    Clinical Impression Statement Pt is a 70 year old male with PD  who returns  to [facility] with a decline in ADL performance and balance.   Pt presents with the following deficits:decreased RUE coordination, rigidity, shoulder pain, decreased ROM, bradykinesia, decreased timing  and coordination of gait, decreased balance, postural instability , abnormal posture, decreased strength, and decreased flexibility.  He has had one fall in the past 6 months.  Pt enjoys spending time with friends and grandchildren.  He would benefit from skilled OT to address these deficits in order to maximize pt's safeyt and indpendnece with daily acticvities.PMH: prostate CA, hx of R TKR, HTN    OT Occupational Profile and History Detailed Assessment- Review of Records and additional review of physical, cognitive, psychosocial history related to current functional performance    Occupational performance deficits (Please refer to evaluation for details): ADL's;IADL's;Leisure;Social Participation    Body Structure / Function / Physical Skills ADL;Balance;Mobility;Strength;Tone;UE functional use;Flexibility;FMC;Coordination;Decreased knowledge of  precautions;GMC;ROM;Decreased knowledge of use of DME;Dexterity;IADL    Rehab Potential Good    Clinical Decision Making Limited treatment options, no task modification necessary    Comorbidities Affecting Occupational Performance: May have comorbidities impacting occupational performance    Modification or Assistance to Complete Evaluation  No modification of tasks or assist necessary to complete eval    OT Frequency 2x / week   may d/c after 4-5 weeks dep on progress   OT Duration 12 weeks   plan written for 12 weeks as pt is going on vacation for a week, pt may need to resume following vactions   OT Treatment/Interventions Self-care/ADL training;Therapeutic exercise;Functional Mobility Training;Balance training;Manual Therapy;Neuromuscular education;Ultrasound;Aquatic Therapy;Energy conservation;Therapeutic activities;DME and/or AE instruction;Paraffin;Cryotherapy;Fluidtherapy;Gait Training;Moist Heat;Contrast Bath;Passive range of motion;Patient/family education    Plan update HEP for coordination supine/ seated PWR!    Recommended Other Services --     Consulted and Agree with Plan of Care Patient           Patient will benefit from skilled therapeutic intervention in order to improve the following deficits and impairments:   Body Structure / Function / Physical Skills: ADL,Balance,Mobility,Strength,Tone,UE functional use,Flexibility,FMC,Coordination,Decreased knowledge of precautions,GMC,ROM,Decreased knowledge of use of DME,Dexterity,IADL       Visit Diagnosis: Unsteadiness on feet  Other symptoms and signs involving the nervous system  Muscle weakness (generalized)  Other lack of coordination  Abnormal posture    Problem List Patient Active Problem List   Diagnosis Date Noted  . S/P TKR (total knee replacement), right 05/05/2019  . Osteoarthritis of right knee 05/02/2019  . Prostate cancer ([place]) 03/22/2018  . Malignant neoplasm of prostate ([place]) 02/27/2018    Melenie Minniear 09/16/2020, 4:15 PM  [facility] [address] [place], [place], [address] Phone: [phone]   Fax:  [phone]  Name: Marquet Lauro MRN: [id number] Date of Birth: June 10, 1950

## 2020-09-17 DIAGNOSIS — I10 Essential (primary) hypertension: Secondary | ICD-10-CM | POA: Diagnosis not present

## 2020-09-17 NOTE — Therapy (Signed)
[facility] [address] [place], [place], [address] Phone: [phone]   Fax:  [phone]  Physical Therapy Evaluation  Patient Details  Name: Matthew [name] MRN: [id number] Date of Birth: 01-Jul-1950 Referring Provider (PT): Star Age   Encounter Date: 09/16/2020   PT End of Session - 09/17/20 1020    Visit Number 1    Number of Visits 13    Date for PT Re-Evaluation 13/24/40   90 day cert, 6 wk POC; pt wants to wait to schedule all 3 disciplines together   Authorization Type [employer]    Progress Note Due on Visit 10    PT Start Time 1404    PT Stop Time 1445    PT Time Calculation (min) 41 min    Activity Tolerance Patient tolerated treatment well    Behavior During Therapy [facility] for tasks assessed/performed           Past Medical History:  Diagnosis Date  . Anxiety   . Arthritis   . GERD (gastroesophageal reflux disease)    occ remote history  . Hypertension   . Parkinson disease ([place])   . Prostate cancer ([place])   . Right knee pain    questionable meniscal tear  . Seasonal allergies     Past Surgical History:  Procedure Laterality Date  . COLONOSCOPY    . Leg Laceration Repair  Left    Age 70  . PELVIC LYMPH NODE DISSECTION Bilateral 03/22/2018   Procedure: PELVIC LYMPH NODE DISSECTION;  Surgeon: Ceasar Mons, MD;  [facility];  Service: Urology;  Laterality: Bilateral;  . PROSTATE BIOPSY    . ROBOT ASSISTED LAPAROSCOPIC RADICAL PROSTATECTOMY N/A 03/22/2018   Procedure: XI ROBOTIC ASSISTED LAPAROSCOPIC PROSTATECTOMY;  Surgeon: Ceasar Mons, MD;  [facility];  Service: Urology;  Laterality: N/A;  . TOTAL KNEE ARTHROPLASTY Right 05/05/2019   Procedure: RIGHT TOTAL KNEE ARTHROPLASTY;  Surgeon: Frederik Pear, MD;  [facility];  Service: Orthopedics;  Laterality: Right;    There were no vitals filed for this visit.    Subjective Assessment -  09/16/20 1405    Subjective Feel like I'm regressing.  I seem to have more balance issues.  Had a fall and hit my head/ear-had to go to ED and have stitches on my ear.  Fall happened with turn/reach too far away.  My wife says sometimes I am moving my body but my feet are staying in place.  Sometimes take an extra step back to avoid falling.    Pertinent History hx of prostate cancer/removal, incontinence, hx of R TKR 04/2019    Patient Stated Goals Pt's goals are to improve balance and try to avoid my overall slowed mobility.    Currently in Pain? No/denies   occasional back pain-shoulder blades, but none at eval; medication helps/malpositioning worsens.             [facility] PT Assessment - 09/16/20 1411      Assessment   Medical Diagnosis Primary Parkinsonism    Referring Provider (PT) Star Age    Onset Date/Surgical Date 08/10/20   MD referral   Hand Dominance Right    Prior Therapy OT, PT, ST ended 01/2020      Precautions   Precautions Fall      Balance Screen   Has the patient fallen in the past 6 months Yes    How many times? 1    Has the patient had a decrease  in activity level because of a fear of falling?  Yes    Is the patient reluctant to leave their home because of a fear of falling?  Yes   more because of slowed mobility     [place] residence    Living Arrangements Spouse/significant other    Available Help at Discharge Family    Type of [place] to enter    Entrance Stairs-Number of Steps 2-3    [place] Two level;Able to live on main level with bedroom/bathroom    [place] - 2 wheels;Cane - single point;Walker - 4 wheels;Grab bars - toilet;Grab bars - tub/shower;Tub bench   borrowed/passed along from family; walk-in shower     Prior Function   Level of Independence Independent    Vocation Retired    Leisure Enjoys being with guy friends playing  horsehoes and darts; does HEP from post TKR; enjoys going to the pool, spending time with grandchildren, enjoys Proofreader, live music.  Sometimes walks in the neighborhood.      Observation/Other Assessments   Focus on Therapeutic Outcomes (FOTO)  NA      Posture/Postural Control   Posture/Postural Control Postural limitations    Postural Limitations Rounded Shoulders;Forward head;Posterior pelvic tilt;Flexed trunk      ROM / Strength   AROM / PROM / Strength Strength;AROM      AROM   Overall AROM  Deficits    Overall AROM Comments Limited knee extension BLEs; tightness in hamstrings      Strength   Overall Strength Within functional limits for tasks performed   BLEs     Transfers   Transfers Sit to Stand;Stand to Sit    Sit to Stand 6: Modified independent (Device/Increase time);From chair/3-in-1    Five time sit to stand comments  11.97    Stand to Sit 6: Modified independent (Device/Increase time);To chair/3-in-1      Ambulation/Gait   Ambulation/Gait Yes    Ambulation/Gait Assistance 5: Supervision    Ambulation Distance (Feet) 200 Feet    Assistive device None    Gait Pattern Step-through pattern;Decreased arm swing - right;Decreased arm swing - left;Decreased step length - right;Decreased step length - left;Right flexed knee in stance;Left flexed knee in stance;Decreased trunk rotation;Trunk flexed    Ambulation Surface Level;Indoor    Gait velocity 10.78 sec = 3.04 ft/sec      Standardized Balance Assessment   Standardized Balance Assessment Timed Up and Go Test;Mini-BESTest      Mini-BESTest   Sit To Stand Normal: Comes to stand without use of hands and stabilizes independently.    Rise to Toes Normal: Stable for 3 s with maximum height.    Stand on one leg (left) Moderate: < 20 s   3.85, 4.68   Stand on one leg (right) Moderate: < 20 s   2.43, 6.06   Stand on one leg - lowest score 1    Compensatory Stepping Correction - Forward Normal: Recovers independently  with a single, large step (second realignement is allowed).    Compensatory Stepping Correction - Backward Normal: Recovers independently with a single, large step    Compensatory Stepping Correction - Left Lateral Normal: Recovers independently with 1 step (crossover or lateral OK)    Compensatory Stepping Correction - Right Lateral Normal: Recovers independently with 1 step (crossover or lateral OK)    Stepping Corredtion Lateral - lowest  score 2    Stance - Feet together, eyes open, firm surface  Normal: 30s    Stance - Feet together, eyes closed, foam surface  Normal: 30s    Incline - Eyes Closed Normal: Stands independently 30s and aligns with gravity    Change in Gait Speed Normal: Significantly changes walkling speed without imbalance    Walk with head turns - Horizontal Moderate: performs head turns with reduction in gait speed.    Walk with pivot turns Moderate:Turns with feet close SLOW (>4 steps) with good balance.    Step over obstacles Moderate: Steps over box but touches box OR displays cautious behavior by slowing gait.    Timed UP & GO with Dual Task Moderate: Dual Task affects either counting OR walking (>10%) when compared to the TUG without Dual Task.    Mini-BEST total score 23      Timed Up and Go Test   Normal TUG (seconds) 14.69    Manual TUG (seconds) 15.87    Cognitive TUG (seconds) 15.31    TUG Comments Scores >13.5-15 sec indicate increased fall risk.                      Objective measurements completed on examination: See above findings.               PT Education - 09/17/20 1019    Education Details PT results and POC    Person(s) Educated Patient    Methods Explanation    Comprehension Verbalized understanding            PT Short Term Goals - 09/17/20 1027      PT SHORT TERM GOAL #1   Title Pt will be indepedent with Parkinson's specific HEP to address balance, transfers, gait for improved overall mobility.  TARGET 10/29/2020  (delayed date due to delayed start-Pt wants OT, PT, speech together)    Time 4    Period Weeks    Status New      PT SHORT TERM GOAL #2   Title Pt will perform at least 8 of 10 reps of sit<>stand transfers from < 18 inch surfaces, with no posterior lean or LOB.    Time 4   Period Weeks    Status New      PT SHORT TERM GOAL #3   Title Pt will improve TUG score to less than or equal to 13.5 sec for decreased fall risk.    Baseline 14.69    Time 4    Period Weeks      PT SHORT TERM GOAL #4   Title 6 MWT to be assessed and goal written as appropriate.    Time 4    Period Weeks    Status New             PT Long Term Goals - 09/17/20 1029      PT LONG TERM GOAL #1   Title Pt will be independent with progression of HEP for Parkinson's related deficits.  TARGET 11/19/2020(delayed due to delayed start for coordination of schedules)    Time 6    Period Weeks    Status New      PT LONG TERM GOAL #2   Title Pt will improve 5x sit<>stand to less than or equal to 10 sec for improved transfer efficiency and safety.    Baseline 11.97 sec    Time 6    Period Weeks      PT LONG  TERM GOAL #3   Title Pt will improve MiniBESTest score to at least 25/28 for decreased fall risk.    Baseline 23/28    Time 6    Period Weeks    Status New      PT LONG TERM GOAL #4   Title Pt will improve TUG cognitive score to less than or equal to 15 sec for improved dual tasking with gait.    Baseline 15.31 sec    Time 6    Period Weeks    Status New      PT LONG TERM GOAL #5   Title Pt will verbalize plans for continued community fitness upon d/c from PT.    Time 6    Period Weeks    Status New                  Plan - 09/17/20 1021    Clinical Impression Statement Pt is a 70 year old male who presents to OPPT for 6 month follow up PT eval for Parkinson's disease.  He was seen for screen approx 1 month ago, with noted slowing of functional mobility and eval was recommended.  Pt reports  overall slowed mobility and decreased balance; he has had one fall in the past 6 months.  Objective measures show slight decline since d/c from PT in 01/2020, with pt at increased fall risk per TUG scores.  Pt demobradykinesia with slowed gait velocity and transfers, decreased timing and coordination of gait, decreased flexibility, decreased balance, decreased dual tasking with gait, likely decreased functional strength with reported difficulty wtih transfers from low surfaces.  Pt will benefit from skilled PT to address the above stated deficits to help improve pt's confidence with functional mobility and decrease fall risk.    Personal Factors and Comorbidities Comorbidity 3+    Comorbidities anxiety, arthritis, GERD, prostate cancer, HTN, s/p R TKR 04/2019    Examination-Activity Limitations Locomotion Level;Transfers;Stand;Stairs    Examination-Participation Restrictions Community Activity;Other   playing with grandchildren   Stability/Clinical Decision Making Evolving/Moderate complexity    Clinical Decision Making Moderate    Rehab Potential Good    PT Frequency 2x / week    PT Duration 6 weeks   plus eval   PT Treatment/Interventions ADLs/Self Care Home Management;Gait training;Functional mobility training;Therapeutic activities;Therapeutic exercise;Balance training;Neuromuscular re-education;DME Instruction;Patient/family education    PT Next Visit Plan 6 MWT, aerobic activity included in HEP, review PWR! Moves and work on exercises for decreased bradykinesia, improved posture; step strategies for balance; try resisted gait    Consulted and Agree with Plan of Care Patient           Patient will benefit from skilled therapeutic intervention in order to improve the following deficits and impairments:  Abnormal gait,Difficulty walking,Decreased balance,Decreased mobility,Postural dysfunction,Decreased strength,Impaired flexibility  Visit Diagnosis: Other abnormalities of gait and  mobility  Unsteadiness on feet  Abnormal posture  Muscle weakness (generalized)  Other symptoms and signs involving the nervous system     Problem List Patient Active Problem List   Diagnosis Date Noted  . S/P TKR (total knee replacement), right 05/05/2019  . Osteoarthritis of right knee 05/02/2019  . Prostate cancer ([place]) 03/22/2018  . Malignant neoplasm of prostate ([place]) 02/27/2018    Matthew [name] W. 09/17/2020, 10:32 AM  Frazier Butt., PT   [facility] [address] [place], [place], [address] Phone: [phone]   Fax:  [phone]  Name: Matthew [name] MRN: [id number] Date of Birth: Jan 24, 1951

## 2020-09-27 DIAGNOSIS — Z0000 Encounter for general adult medical examination without abnormal findings: Secondary | ICD-10-CM | POA: Diagnosis not present

## 2020-09-27 DIAGNOSIS — I10 Essential (primary) hypertension: Secondary | ICD-10-CM | POA: Diagnosis not present

## 2020-09-27 DIAGNOSIS — E7800 Pure hypercholesterolemia, unspecified: Secondary | ICD-10-CM | POA: Diagnosis not present

## 2020-09-27 DIAGNOSIS — M25561 Pain in right knee: Secondary | ICD-10-CM | POA: Diagnosis not present

## 2020-09-28 DIAGNOSIS — R7309 Other abnormal glucose: Secondary | ICD-10-CM | POA: Diagnosis not present

## 2020-09-28 DIAGNOSIS — R509 Fever, unspecified: Secondary | ICD-10-CM | POA: Diagnosis not present

## 2020-09-28 DIAGNOSIS — I10 Essential (primary) hypertension: Secondary | ICD-10-CM | POA: Diagnosis not present

## 2020-09-28 DIAGNOSIS — E7800 Pure hypercholesterolemia, unspecified: Secondary | ICD-10-CM | POA: Diagnosis not present

## 2020-09-28 DIAGNOSIS — Z0000 Encounter for general adult medical examination without abnormal findings: Secondary | ICD-10-CM | POA: Diagnosis not present

## 2020-09-28 DIAGNOSIS — M25569 Pain in unspecified knee: Secondary | ICD-10-CM | POA: Diagnosis not present

## 2020-09-28 DIAGNOSIS — R059 Cough, unspecified: Secondary | ICD-10-CM | POA: Diagnosis not present

## 2020-09-28 DIAGNOSIS — B349 Viral infection, unspecified: Secondary | ICD-10-CM | POA: Diagnosis not present

## 2020-10-05 ENCOUNTER — Other Ambulatory Visit: Payer: Self-pay

## 2020-10-05 ENCOUNTER — Encounter: Payer: Self-pay | Admitting: Physical Therapy

## 2020-10-05 ENCOUNTER — Ambulatory Visit: Payer: PPO

## 2020-10-05 ENCOUNTER — Ambulatory Visit: Payer: PPO | Attending: Neurology | Admitting: Physical Therapy

## 2020-10-05 ENCOUNTER — Ambulatory Visit: Payer: PPO | Admitting: Occupational Therapy

## 2020-10-05 DIAGNOSIS — R278 Other lack of coordination: Secondary | ICD-10-CM | POA: Diagnosis not present

## 2020-10-05 DIAGNOSIS — R471 Dysarthria and anarthria: Secondary | ICD-10-CM | POA: Diagnosis not present

## 2020-10-05 DIAGNOSIS — M6281 Muscle weakness (generalized): Secondary | ICD-10-CM | POA: Insufficient documentation

## 2020-10-05 DIAGNOSIS — R1310 Dysphagia, unspecified: Secondary | ICD-10-CM | POA: Diagnosis not present

## 2020-10-05 DIAGNOSIS — R29818 Other symptoms and signs involving the nervous system: Secondary | ICD-10-CM | POA: Diagnosis present

## 2020-10-05 DIAGNOSIS — R293 Abnormal posture: Secondary | ICD-10-CM | POA: Insufficient documentation

## 2020-10-05 DIAGNOSIS — R2681 Unsteadiness on feet: Secondary | ICD-10-CM | POA: Insufficient documentation

## 2020-10-05 DIAGNOSIS — R29898 Other symptoms and signs involving the musculoskeletal system: Secondary | ICD-10-CM | POA: Insufficient documentation

## 2020-10-05 DIAGNOSIS — R2689 Other abnormalities of gait and mobility: Secondary | ICD-10-CM | POA: Diagnosis not present

## 2020-10-05 NOTE — Therapy (Signed)
[facility] [address] [place], [place], [address] Phone: [phone]   Fax:  [phone]  Physical Therapy Treatment  Patient Details  Name: Antawn Sison MRN: [id number] Date of Birth: 02-06-1951 Referring Provider (PT): Star Age   Encounter Date: 10/05/2020   PT End of Session - 10/05/20 1427    Visit Number 2    Number of Visits 13    Date for PT Re-Evaluation 27/74/12   90 day cert, 6 wk POC; pt wants to wait to schedule all 3 disciplines together   Authorization Type [employer]    Progress Note Due on Visit 10    PT Start Time 8786    PT Stop Time 1317    PT Time Calculation (min) 42 min    Activity Tolerance Patient tolerated treatment well    Behavior During Therapy [facility] for tasks assessed/performed           Past Medical History:  Diagnosis Date  . Anxiety   . Arthritis   . GERD (gastroesophageal reflux disease)    occ remote history  . Hypertension   . Parkinson disease ([place])   . Prostate cancer ([place])   . Right knee pain    questionable meniscal tear  . Seasonal allergies     Past Surgical History:  Procedure Laterality Date  . COLONOSCOPY    . Leg Laceration Repair  Left    Age 70  . PELVIC LYMPH NODE DISSECTION Bilateral 03/22/2018   Procedure: PELVIC LYMPH NODE DISSECTION;  Surgeon: Ceasar Mons, MD;  Location: [facility];  Service: Urology;  Laterality: Bilateral;  . PROSTATE BIOPSY    . ROBOT ASSISTED LAPAROSCOPIC RADICAL PROSTATECTOMY N/A 03/22/2018   Procedure: XI ROBOTIC ASSISTED LAPAROSCOPIC PROSTATECTOMY;  Surgeon: Ceasar Mons, MD;  Location: [facility];  Service: Urology;  Laterality: N/A;  . TOTAL KNEE ARTHROPLASTY Right 05/05/2019   Procedure: RIGHT TOTAL KNEE ARTHROPLASTY;  Surgeon: Frederik Pear, MD;  Location: [facility];  Service: Orthopedics;  Laterality: Right;    There were no vitals filed for this visit.   Subjective Assessment - 10/05/20  1239    Subjective Thinks the neupro patch may be helping. No falls since he was here for his evaluation. Still has been doing his PWR moves at least 5 days a week. Had to stop the walking due to the cold winter, has not resumed yet. Has silver sneakers - wants to try to do a seated bike. Has been experiencing back pain recently, but not now    Pertinent History hx of prostate cancer/removal, incontinence, hx of R TKR 04/2019    Patient Stated Goals Pt's goals are to improve balance and try to avoid my overall slowed mobility.    Currently in Pain? No/denies              [facility] PT Assessment - 10/05/20 1243      Ambulation/Gait   Ambulation/Gait Yes    Ambulation/Gait Assistance 5: Supervision    Ambulation Distance (Feet) 931 Feet    Assistive device None    Gait Pattern Step-through pattern;Decreased arm swing - right;Decreased arm swing - left;Decreased step length - right;Decreased step length - left;Right flexed knee in stance;Left flexed knee in stance;Decreased trunk rotation;Trunk flexed    Ambulation Surface Level;Indoor      6 Minute Walk- Baseline   6 Minute Walk- Baseline yes    BP (mmHg) 130/81    HR (bpm) 59    02 Sat (%RA)  98 %    Modified Borg Scale for Dyspnea 0- Nothing at all      6 Minute walk- Post Test   6 Minute Walk Post Test yes    BP (mmHg) 133/72    HR (bpm) 70    02 Sat (%RA) 98 %    Modified Borg Scale for Dyspnea 2- Mild shortness of breath    Perceived Rate of Exertion (Borg) 11- Fairly light      6 minute walk test results    Aerobic Endurance Distance Walked 931    Endurance additional comments 160' during 1st lap, 145' during last lap. reports hips started to feel a little sore towards the end. Pt demonstrated more forward flexed posture and shorter steps towards the end.                                  Pt performs PWR! Moves in standing  position (review from previous HEP)   PWR! Up for improved posture - x20 reps -  initial cues for technique and to hold first couple of reps for a couple seconds for more of a sustained posture/postural awareness.   PWR! [name] for improved weighshifting x20 reps - pt performed this one well  PWR! Twist for improved trunk rotation x5 reps B, x10 reps B - pt needs cues to have wider BOS, stop in the middle with tall posture and reset before twisting to other side and to pivot with foot to help improve trunk rotation. Pt improved with incr reps.  PWR! Step for improved step initiation 2 x 5 reps B - cues to step out and stay tall (pt demonstrating almost leaning over to the ground), cues to use a chair or dresser at home for sturdy support at needed for balance, cues to "spring" leg back to the middle for improved foot clearance   Cues provided for technique and  larger movement patterns. Pt reporting intensity as a 7/10.         PT Education - 10/05/20 1427    Education Details results of 6MWT, reviewed standing PWR moves from previous HEP.    Person(s) Educated Patient    Methods Explanation;Demonstration;Verbal cues    Comprehension Returned demonstration;Verbalized understanding            PT Short Term Goals - 10/05/20 1431      PT SHORT TERM GOAL #1   Title Pt will be indepedent with Parkinson's specific HEP to address balance, transfers, gait for improved overall mobility.  TARGET 10/29/2020 (delayed date due to delayed start-Pt wants OT, PT, speech together)    Time 4    Period Weeks    Status New      PT SHORT TERM GOAL #2   Title Pt will perform at least 8 of 10 reps of sit<>stand transfers from < 18 inch surfaces, with no posterior lean or LOB.    Time 4    Period Weeks    Status New      PT SHORT TERM GOAL #3   Title Pt will improve TUG score to less than or equal to 13.5 sec for decreased fall risk.    Baseline 14.69    Time 4    Period Weeks      PT SHORT TERM GOAL #4   Title 6 MWT to be assessed and goal written as appropriate.    Baseline  931' with LTG written  on 10/05/20    Time 4    Period Weeks    Status Achieved             PT Long Term Goals - 10/05/20 1432      PT LONG TERM GOAL #1   Title Pt will be independent with progression of HEP for Parkinson's related deficits.  TARGET 11/19/2020(delayed due to delayed start for coordination of schedules)    Time 6    Period Weeks    Status New      PT LONG TERM GOAL #2   Title Pt will improve 5x sit<>stand to less than or equal to 10 sec for improved transfer efficiency and safety.    Baseline 11.97 sec    Time 6    Period Weeks      PT LONG TERM GOAL #3   Title Pt will improve MiniBESTest score to at least 25/28 for decreased fall risk.    Baseline 23/28    Time 6    Period Weeks    Status New      PT LONG TERM GOAL #4   Title Pt will improve TUG cognitive score to less than or equal to 15 sec for improved dual tasking with gait.    Baseline 15.31 sec    Time 6    Period Weeks    Status New      PT LONG TERM GOAL #5   Title Pt will verbalize plans for continued community fitness upon d/c from PT.    Time 6    Period Weeks    Status New      Additional Long Term Goals   Additional Long Term Goals Yes      PT LONG TERM GOAL #6   Title Pt will improve 6MWT distance by at least 100' in order to demo improved gait endurance for community distances.    Baseline 931' on 10/05/20    Time 6    Period Weeks                 Plan - 10/05/20 1432    Clinical Impression Statement Assessed 6MWT today with pt ambulating 931'. As time went on, pt did demonstrate more forward flexed posture and was taking shorter steps. LTG written as appropriate. Reviewed standing PWR moves from pt's previous bout of therapy, pt performing PWR Up and [name] well, but did need cues for PWR Twist and Step for proper technique and incr intensity of movement. Will benefit from further review. Will continue to progress towards LTGs.    Personal Factors and Comorbidities Comorbidity  3+    Comorbidities anxiety, arthritis, GERD, prostate cancer, HTN, s/p R TKR 04/2019    Examination-Activity Limitations Locomotion Level;Transfers;Stand;Stairs    Examination-Participation Restrictions Community Activity;Other   playing with grandchildren   Stability/Clinical Decision Making Evolving/Moderate complexity    Rehab Potential Good    PT Frequency 2x / week    PT Duration 6 weeks   plus eval   PT Treatment/Interventions ADLs/Self Care Home Management;Gait training;Functional mobility training;Therapeutic activities;Therapeutic exercise;Balance training;Neuromuscular re-education;DME Instruction;Patient/family education    PT Next Visit Plan add aerobic activity included in HEP, review standing PWR and other stepping exercise from HEP, work on exercises for decreased bradykinesia, improved posture; step strategies for balance; try resisted gait    Consulted and Agree with Plan of Care Patient           Patient will benefit from skilled therapeutic intervention in order to improve the  following deficits and impairments:  Abnormal gait,Difficulty walking,Decreased balance,Decreased mobility,Postural dysfunction,Decreased strength,Impaired flexibility  Visit Diagnosis: Unsteadiness on feet  Other symptoms and signs involving the nervous system  Abnormal posture  Other lack of coordination     Problem List Patient Active Problem List   Diagnosis Date Noted  . S/P TKR (total knee replacement), right 05/05/2019  . Osteoarthritis of right knee 05/02/2019  . Prostate cancer ([place]) 03/22/2018  . Malignant neoplasm of prostate ([place]) 02/27/2018    Arliss Journey, PT ,DPT 10/05/2020, 2:35 PM  [facility] [address] [place], [place], [address] Phone: [phone]   Fax:  [phone]  Name: Lemuel Kreiner MRN: [id number] Date of Birth: Sep 24, 1950

## 2020-10-05 NOTE — Therapy (Signed)
[facility] [address] [place], [place], [address] Phone: [phone]   Fax:  [phone]  Occupational Therapy Treatment  Patient Details  Name: Matthew [name] MRN: [id number] Date of Birth: 12/04/1950 Referring Provider (OT): Dr. Rexene Alberts   Encounter Date: 10/05/2020   OT End of Session - 10/05/20 1439    Visit Number 2    Number of Visits 17    Date for OT Re-Evaluation 11/18/20    Authorization Type [employer]    Authorization Time Period POC writtent for 8 weeks pt scheduled for 6 weeks    Authorization - Visit Number 2    Authorization - Number of Visits 10    OT Start Time 1407    OT Stop Time 1447    OT Time Calculation (min) 40 min    Behavior During Therapy [facility] for tasks assessed/performed           Past Medical History:  Diagnosis Date  . Anxiety   . Arthritis   . GERD (gastroesophageal reflux disease)    occ remote history  . Hypertension   . Parkinson disease ([place])   . Prostate cancer ([place])   . Right knee pain    questionable meniscal tear  . Seasonal allergies     Past Surgical History:  Procedure Laterality Date  . COLONOSCOPY    . Leg Laceration Repair  Left    Age 70  . PELVIC LYMPH NODE DISSECTION Bilateral 03/22/2018   Procedure: PELVIC LYMPH NODE DISSECTION;  Surgeon: Ceasar Mons, MD;  Location: [facility];  Service: Urology;  Laterality: Bilateral;  . PROSTATE BIOPSY    . ROBOT ASSISTED LAPAROSCOPIC RADICAL PROSTATECTOMY N/A 03/22/2018   Procedure: XI ROBOTIC ASSISTED LAPAROSCOPIC PROSTATECTOMY;  Surgeon: Ceasar Mons, MD;  Location: [facility];  Service: Urology;  Laterality: N/A;  . TOTAL KNEE ARTHROPLASTY Right 05/05/2019   Procedure: RIGHT TOTAL KNEE ARTHROPLASTY;  Surgeon: Frederik Pear, MD;  Location: [facility];  Service: Orthopedics;  Laterality: Right;    There were no vitals filed for this visit.   Subjective Assessment - 10/05/20 1439    Subjective   Denies    Pertinent History PMH:  PD newly diagnosed , anxiety, arthritis, GERD, prostate cancer, HTN, s/p R TKR 04/2019    Limitations hx of CA, use caution with modalities.    Patient Stated Goals improve ADL performance, balance    Currently in Pain? No/denies                                OT Treatment/ Education - 10/05/20 1441    Education Details Reviewed modified quadraped PWR! review 10 reps each, min v.c, coordination activities: flipping and dealing cards with big movements, stacking and manipulating coins, PWR! hands basic 4- pt has previously issued handouts , min-mod v.c and demonstration for exercises   Person(s) Educated Patient    Methods Explanation;Demonstration;Verbal cues    Comprehension Verbalized understanding;Returned demonstration;Verbal cues required            OT Short Term Goals - 09/16/20 1601      OT SHORT TERM GOAL #1   Title I with PD specific HEP    Time 4    Period Weeks    Status New    Target Date 10/21/20      OT SHORT TERM GOAL #2   Title Pt will verbalize understanding of adapted strategies to maximize safety  and I with ADLs/ IADLs .    Time 4    Period Weeks    Status New      OT SHORT TERM GOAL #3   Title Test box/ blocks and set goal prn    Baseline LUE 42 blocks , need to re-test RUE    Time 4    Period Weeks    Status New   45     OT SHORT TERM GOAL #4   Title Pt will demonstrate increased ease with dressing as evidenced by decreasing PPT#4(don/ doff jacket) to 25 secs or less    Baseline 32.35    Time 4    Period Weeks    Status New      OT SHORT TERM GOAL #5   Title Pt will retrieve a lightweight object from overhead shelf at 120 shoulder flexion with left and right UE's    Time 4    Period Weeks    Status New   -5 elbow ext            OT Long Term Goals - 09/16/20 1603      OT LONG TERM GOAL #1   Title Pt will verbalize understanding of ways to prevent future PD related complications and  PD community resources following review    Time 8    Period Weeks    Status New    Target Date 11/18/20      OT LONG TERM GOAL #2   Title Pt will demonstrate improved fine motor coordination for ADLs as evidenced by decreasing 9 hole peg test score for RUE by 3 secs    Baseline 38    Time 8    Period Weeks    Status New      OT LONG TERM GOAL #3   Title Pt will demonstrate improved ease with dressing as eveidenced by decreasing 3 button/ unbutton time to 30 secs or less    Baseline 34.09    Time 8    Period Weeks    Status Achieved      OT LONG TERM GOAL #4   Title Pt will report pain in right shoulder no greater than 2/10 for ADLs    Time 8    Period Weeks    Status New      OT LONG TERM GOAL #5   Title .    Time --    Period --                 Plan - 10/05/20 1443    Clinical Impression Statement Pt is progressing towards goals. He responds well to v.c for larger amplitude movements    OT Occupational Profile and History Detailed Assessment- Review of Records and additional review of physical, cognitive, psychosocial history related to current functional performance    Occupational performance deficits (Please refer to evaluation for details): ADL's;IADL's;Leisure;Social Participation    Body Structure / Function / Physical Skills ADL;Balance;Mobility;Strength;Tone;UE functional use;Flexibility;FMC;Coordination;Decreased knowledge of precautions;GMC;ROM;Decreased knowledge of use of DME;Dexterity;IADL    Rehab Potential Good    Clinical Decision Making Limited treatment options, no task modification necessary    Comorbidities Affecting Occupational Performance: May have comorbidities impacting occupational performance    Modification or Assistance to Complete Evaluation  No modification of tasks or assist necessary to complete eval    OT Frequency 2x / week   may d/c after 4-6 weeks dep on progress   OT Duration 8 weeks   plan written for  12 weeks as pt is going on  vacation for a week, pt may need to resume following vactions   OT Treatment/Interventions Self-care/ADL training;Therapeutic exercise;Functional Mobility Training;Balance training;Manual Therapy;Neuromuscular education;Ultrasound;Aquatic Therapy;Energy conservation;Therapeutic activities;DME and/or AE instruction;Paraffin;Cryotherapy;Fluidtherapy;Gait Training;Moist Heat;Contrast Bath;Passive range of motion;Patient/family education    Plan work towards goals, ADL strategeis    Consulted and Agree with Plan of Care Patient           Patient will benefit from skilled therapeutic intervention in order to improve the following deficits and impairments:   Body Structure / Function / Physical Skills: ADL,Balance,Mobility,Strength,Tone,UE functional use,Flexibility,FMC,Coordination,Decreased knowledge of precautions,GMC,ROM,Decreased knowledge of use of DME,Dexterity,IADL       Visit Diagnosis: Other symptoms and signs involving the nervous system  Abnormal posture  Other lack of coordination  Other symptoms and signs involving the musculoskeletal system    Problem List Patient Active Problem List   Diagnosis Date Noted  . S/P TKR (total knee replacement), right 05/05/2019  . Osteoarthritis of right knee 05/02/2019  . Prostate cancer ([place]) 03/22/2018  . Malignant neoplasm of prostate ([place]) 02/27/2018    Shiree Altemus 10/05/2020, 2:44 PM  [facility] [address] [place], [place], [address] Phone: [phone]   Fax:  [phone]  Name: Matthew [name] MRN: [id number] Date of Birth: 1950/06/17

## 2020-10-08 ENCOUNTER — Encounter: Payer: Self-pay | Admitting: Physical Therapy

## 2020-10-08 ENCOUNTER — Encounter: Payer: Self-pay | Admitting: Occupational Therapy

## 2020-10-08 ENCOUNTER — Ambulatory Visit: Payer: PPO | Admitting: Physical Therapy

## 2020-10-08 ENCOUNTER — Other Ambulatory Visit: Payer: Self-pay

## 2020-10-08 ENCOUNTER — Ambulatory Visit: Payer: PPO | Admitting: Occupational Therapy

## 2020-10-08 ENCOUNTER — Ambulatory Visit: Payer: PPO

## 2020-10-08 DIAGNOSIS — R2681 Unsteadiness on feet: Secondary | ICD-10-CM

## 2020-10-08 DIAGNOSIS — R293 Abnormal posture: Secondary | ICD-10-CM

## 2020-10-08 DIAGNOSIS — M6281 Muscle weakness (generalized): Secondary | ICD-10-CM

## 2020-10-08 DIAGNOSIS — R278 Other lack of coordination: Secondary | ICD-10-CM

## 2020-10-08 DIAGNOSIS — R29898 Other symptoms and signs involving the musculoskeletal system: Secondary | ICD-10-CM

## 2020-10-08 DIAGNOSIS — R2689 Other abnormalities of gait and mobility: Secondary | ICD-10-CM

## 2020-10-08 DIAGNOSIS — R1310 Dysphagia, unspecified: Secondary | ICD-10-CM

## 2020-10-08 DIAGNOSIS — R29818 Other symptoms and signs involving the nervous system: Secondary | ICD-10-CM

## 2020-10-08 DIAGNOSIS — R471 Dysarthria and anarthria: Secondary | ICD-10-CM

## 2020-10-08 NOTE — Therapy (Signed)
[facility] [address] [place], [place], [address] Phone: [phone]   Fax:  [phone]  Physical Therapy Treatment  Patient Details  Name: Matthew [name] MRN: [id number] Date of Birth: 1951-02-04 Referring Provider (PT): Star Age   Encounter Date: 10/08/2020   PT End of Session - 10/08/20 1114    Visit Number 3    Number of Visits 13    Date for PT Re-Evaluation 89/37/34   90 day cert, 6 wk POC; pt wants to wait to schedule all 3 disciplines together   Authorization Type [employer]    Progress Note Due on Visit 10    PT Start Time 1103    PT Stop Time 1145    PT Time Calculation (min) 42 min    Activity Tolerance Patient tolerated treatment well    Behavior During Therapy [facility] for tasks assessed/performed           Past Medical History:  Diagnosis Date  . Anxiety   . Arthritis   . GERD (gastroesophageal reflux disease)    occ remote history  . Hypertension   . Parkinson disease ([place])   . Prostate cancer ([place])   . Right knee pain    questionable meniscal tear  . Seasonal allergies     Past Surgical History:  Procedure Laterality Date  . COLONOSCOPY    . Leg Laceration Repair  Left    Age 70  . PELVIC LYMPH NODE DISSECTION Bilateral 03/22/2018   Procedure: PELVIC LYMPH NODE DISSECTION;  Surgeon: Ceasar Mons, MD;  [facility]: [facility];  Service: Urology;  Laterality: Bilateral;  . PROSTATE BIOPSY    . ROBOT ASSISTED LAPAROSCOPIC RADICAL PROSTATECTOMY N/A 03/22/2018   Procedure: XI ROBOTIC ASSISTED LAPAROSCOPIC PROSTATECTOMY;  Surgeon: Ceasar Mons, MD;  [facility]: [facility];  Service: Urology;  Laterality: N/A;  . TOTAL KNEE ARTHROPLASTY Right 05/05/2019   Procedure: RIGHT TOTAL KNEE ARTHROPLASTY;  Surgeon: Frederik Pear, MD;  [facility]: [facility];  Service: Orthopedics;  Laterality: Right;    There were no vitals filed for this visit.   Subjective Assessment - 10/08/20  1104    Subjective No pain today, so far.  Nothing new since last visit.    Pertinent History hx of prostate cancer/removal, incontinence, hx of R TKR 04/2019    Patient Stated Goals Pt's goals are to improve balance and try to avoid my overall slowed mobility.    Currently in Pain? No/denies                             [facility] Adult PT Treatment/Exercise - 10/08/20 1111      Ambulation/Gait   Ambulation/Gait Yes    Ambulation/Gait Assistance 5: Supervision;4: Min assist    Ambulation/Gait Assistance Details Using bilateral walking poles to help wtih posture, step length, coordination.  Gait in gym, x 460 ft using bilateral poles, with occasional hand over hand assist (in addition to verbal, visual cues), for increased step length, coordinated reciprocal walking pole use.  Transitioned x 2 laps (230 ft) to single walking pole with cues for slowed pace for coordinated seuqence.  At end of session, gait x 230 ft no device, cues for incrased step length and relaxed arm swing.    Assistive device None;Other (Comment)   bilateral, then single walking pole   Gait Pattern Step-through pattern;Decreased arm swing - right;Decreased arm swing - left;Decreased step length - right;Decreased step length - left;Right  flexed knee in stance;Left flexed knee in stance;Decreased trunk rotation;Trunk flexed    Ambulation Surface Level;Indoor    Gait Comments Pt interested in use of bilateral walking poles, will need more practice (likely outside)      Neuro Re-ed    Neuro Re-ed Details  PWR! Moves POSTURE FLOW:  seated PWR! Up>Sit to stand PWR! Up>Standing PWR! Up, 5 reps, x 2 sets.  Cues for 3 sec hold in PWR! Up each position.  Standing PWR! Moves using walking poles:  PWR! Up x 10, PWR! [name] x 5 reps each side, PWR! Twist (together>open) x 5 reps each side, then PWR! Step, 5 reps each side, PT providing visual and verbal cues.      Knee/Hip Exercises: Aerobic   Stepper SciFit  BUEs/BLEs, x 8  minutes, Level 1.8>2.0, initially steps/min at 65-75, then with cues, maintains steps/min >85-95 throughout, for aerobic activity.  Educated on how to incorporate aerobic activity into current exercise routine.  Rates effort as 5-6/10.  Explained to increase to effort level of 7/10    Other Aerobic Discussed ways pt can incorporate aerobic acivity into his current exercise routine.  He has a seated bike at the clubhouse in his neighborhood-encouraged pt to try this over the weekend.  PT mentioned PD cycling class at [facility] and will provide more info next visit.                  PT Education - 10/08/20 1432    Education Details Try to use recumbent bike at clubhouse this weekend for initial addition to aerobic activity in HEP; start 10 minutes, low resistance (effort level 6-8/10)    Person(s) Educated Patient    Methods Explanation    Comprehension Verbalized understanding            PT Short Term Goals - 10/05/20 1431      PT SHORT TERM GOAL #1   Title Pt will be indepedent with Parkinson's specific HEP to address balance, transfers, gait for improved overall mobility.  TARGET 10/29/2020 (delayed date due to delayed start-Pt wants OT, PT, speech together)    Time 4    Period Weeks    Status New      PT SHORT TERM GOAL #2   Title Pt will perform at least 8 of 10 reps of sit<>stand transfers from < 18 inch surfaces, with no posterior lean or LOB.    Time 4    Period Weeks    Status New      PT SHORT TERM GOAL #3   Title Pt will improve TUG score to less than or equal to 13.5 sec for decreased fall risk.    Baseline 14.69    Time 4    Period Weeks      PT SHORT TERM GOAL #4   Title 6 MWT to be assessed and goal written as appropriate.    Baseline 931' with LTG written on 10/05/20    Time 4    Period Weeks    Status Achieved             PT Long Term Goals - 10/05/20 1432      PT LONG TERM GOAL #1   Title Pt will be independent with progression of HEP for Parkinson's  related deficits.  TARGET 11/19/2020(delayed due to delayed start for coordination of schedules)    Time 6    Period Weeks    Status New      PT LONG TERM  GOAL #2   Title Pt will improve 5x sit<>stand to less than or equal to 10 sec for improved transfer efficiency and safety.    Baseline 11.97 sec    Time 6    Period Weeks      PT LONG TERM GOAL #3   Title Pt will improve MiniBESTest score to at least 25/28 for decreased fall risk.    Baseline 23/28    Time 6    Period Weeks    Status New      PT LONG TERM GOAL #4   Title Pt will improve TUG cognitive score to less than or equal to 15 sec for improved dual tasking with gait.    Baseline 15.31 sec    Time 6    Period Weeks    Status New      PT LONG TERM GOAL #5   Title Pt will verbalize plans for continued community fitness upon d/c from PT.    Time 6    Period Weeks    Status New      Additional Long Term Goals   Additional Long Term Goals Yes      PT LONG TERM GOAL #6   Title Pt will improve 6MWT distance by at least 100' in order to demo improved gait endurance for community distances.    Baseline 931' on 10/05/20    Time 6    Period Weeks                 Plan - 10/08/20 1433    Clinical Impression Statement Skilled PT session focused on aerobic activity and gait with walking poles to facilitate increased step length, coordinated arm swing.  Pt has difficulty wtih sequence of walking poles today, and does better with single pole.  He is eager to incoporate aerobic activities into his current program.  He will continue to benefit from skilled PT to further address posture, balance, gait for imrpoved overlal functional mobility.    Personal Factors and Comorbidities Comorbidity 3+    Comorbidities anxiety, arthritis, GERD, prostate cancer, HTN, s/p R TKR 04/2019    Examination-Activity Limitations Locomotion Level;Transfers;Stand;Stairs    Examination-Participation Restrictions Community Activity;Other   playing  with grandchildren   Stability/Clinical Decision Making Evolving/Moderate complexity    Rehab Potential Good    PT Frequency 2x / week    PT Duration 6 weeks   plus eval   PT Treatment/Interventions ADLs/Self Care Home Management;Gait training;Functional mobility training;Therapeutic activities;Therapeutic exercise;Balance training;Neuromuscular re-education;DME Instruction;Patient/family education    PT Next Visit Plan ask if pt did bike at club house and discuss adding aerobic activity included in HEP; can also discuss PD spin class at [facility]; Review standing PWR and other stepping exercise from HEP, work on exercises for decreased bradykinesia, improved posture; step strategies for balance; try resisted gait.  Try gait with bilateral walking poles (if weather isn't so hot)    Consulted and Agree with Plan of Care Patient           Patient will benefit from skilled therapeutic intervention in order to improve the following deficits and impairments:  Abnormal gait,Difficulty walking,Decreased balance,Decreased mobility,Postural dysfunction,Decreased strength,Impaired flexibility  Visit Diagnosis: Muscle weakness (generalized)  Abnormal posture  Other abnormalities of gait and mobility  Unsteadiness on feet     Problem List Patient Active Problem List   Diagnosis Date Noted  . S/P TKR (total knee replacement), right 05/05/2019  . Osteoarthritis of right knee 05/02/2019  . Prostate cancer ([place])  03/22/2018  . Malignant neoplasm of prostate ([place]) 02/27/2018    Sanav Remer W. 10/08/2020, 2:38 PM Frazier Butt., PT [place] [address] [place], [place], [address] Phone: [phone]   Fax:  [phone]  Name: Matthew [name] MRN: [id number] Date of Birth: 11/22/1950

## 2020-10-08 NOTE — Therapy (Addendum)
[facility] [address] [place], [place], [address] Phone: [phone]   Fax:  [phone]  Occupational Therapy Treatment  Patient Details  Name: Matthew [name] MRN: [id number] Date of Birth: 10-Apr-1951 Referring Provider (OT): Dr. Rexene Alberts   Encounter Date: 10/08/2020   OT End of Session - 10/08/20 1022    Visit Number 3    Number of Visits 17    Date for OT Re-Evaluation 11/18/20    Authorization Type [employer]    Authorization Time Period POC writtent for 8 weeks pt scheduled for 6 weeks    Authorization - Visit Number 3    Authorization - Number of Visits 10    OT Start Time 1020    OT Stop Time 1100    OT Time Calculation (min) 40 min           Past Medical History:  Diagnosis Date  . Anxiety   . Arthritis   . GERD (gastroesophageal reflux disease)    occ remote history  . Hypertension   . Parkinson disease ([place])   . Prostate cancer ([place])   . Right knee pain    questionable meniscal tear  . Seasonal allergies     Past Surgical History:  Procedure Laterality Date  . COLONOSCOPY    . Leg Laceration Repair  Left    Age 26  . PELVIC LYMPH NODE DISSECTION Bilateral 03/22/2018   Procedure: PELVIC LYMPH NODE DISSECTION;  Surgeon: Ceasar Mons, MD;  [facility];  Service: Urology;  Laterality: Bilateral;  . PROSTATE BIOPSY    . ROBOT ASSISTED LAPAROSCOPIC RADICAL PROSTATECTOMY N/A 03/22/2018   Procedure: XI ROBOTIC ASSISTED LAPAROSCOPIC PROSTATECTOMY;  Surgeon: Ceasar Mons, MD;  [facility];  Service: Urology;  Laterality: N/A;  . TOTAL KNEE ARTHROPLASTY Right 05/05/2019   Procedure: RIGHT TOTAL KNEE ARTHROPLASTY;  Surgeon: Frederik Pear, MD;  [facility];  Service: Orthopedics;  Laterality: Right;    There were no vitals filed for this visit.   Subjective Assessment - 10/08/20 1021    Subjective  Denies pain    Pertinent History PMH:  PD newly diagnosed ,  anxiety, arthritis, GERD, prostate cancer, HTN, s/p R TKR 04/2019    Limitations hx of CA, use caution with modalities.    Patient Stated Goals improve ADL performance, balance    Currently in Pain? No/denies                   Treatment: Dynamic step and reach To place and remove graded clothespins min v.c for amplitude, bigger steps and elbow and finger ext. PWR! hands basic 4,( 5-10 reps each), min v.c Rolling die with one hand and manipulating block to match with other hand, for in hand manipulation and dexterity, min v.c, increased difficulty with right hand Arm bike x 5 mins for conditioning, min v.c for speed, pt maintained 30 rpm or greater.             OT Education - 10/08/20 1044    Education Details PWR! moves basic 4 in seated 10 reps each, min v.c for amplitude    Person(s) Educated Patient    Methods Explanation;Demonstration;Verbal cues    Comprehension Verbalized understanding;Returned demonstration;Verbal cues required            OT Short Term Goals - 10/08/20 1043      OT SHORT TERM GOAL #1   Title I with PD specific HEP    Time 4  Period Weeks    Status On-going    Target Date 10/21/20      OT SHORT TERM GOAL #2   Title Pt will verbalize understanding of adapted strategies to maximize safety and I with ADLs/ IADLs .    Time 4    Period Weeks    Status On-going      OT SHORT TERM GOAL #3   Title Pt will demonstrate improved UE funcitonal use as eviedenced by increaseing box/ blocks by 3 blocks bilaterally.    Baseline RUE 46 LUE 42 blocks ,    Time 4    Period Weeks    Status New      OT SHORT TERM GOAL #4   Title Pt will demonstrate increased ease with dressing as evidenced by decreasing PPT#4(don/ doff jacket) to 25 secs or less    Baseline 32.35    Time 4    Period Weeks    Status On-going      OT SHORT TERM GOAL #5   Title Pt will retrieve a lightweight object from overhead shelf at 120 shoulder flexion with left and right  UE's    Time 4    Period Weeks    Status On-going   -5 elbow ext            OT Long Term Goals - 09/16/20 1603      OT LONG TERM GOAL #1   Title Pt will verbalize understanding of ways to prevent future PD related complications and PD community resources following review    Time 8    Period Weeks    Status New    Target Date 11/18/20      OT LONG TERM GOAL #2   Title Pt will demonstrate improved fine motor coordination for ADLs as evidenced by decreasing 9 hole peg test score for RUE by 3 secs    Baseline 38    Time 8    Period Weeks    Status New      OT LONG TERM GOAL #3   Title Pt will demonstrate improved ease with dressing as eveidenced by decreasing 3 button/ unbutton time to 30 secs or less    Baseline 34.09    Time 8    Period Weeks    Status Achieved      OT LONG TERM GOAL #4   Title Pt will report pain in right shoulder no greater than 2/10 for ADLs    Time 8    Period Weeks    Status New      OT LONG TERM GOAL #5   Title .    Time --    Period --                 Plan - 10/08/20 1024    Clinical Impression Statement Pt is progressing towards goals. He benefits from v.c for amplitude and posture.    OT Occupational Profile and History Detailed Assessment- Review of Records and additional review of physical, cognitive, psychosocial history related to current functional performance    Occupational performance deficits (Please refer to evaluation for details): ADL's;IADL's;Leisure;Social Participation    Body Structure / Function / Physical Skills ADL;Balance;Mobility;Strength;Tone;UE functional use;Flexibility;FMC;Coordination;Decreased knowledge of precautions;GMC;ROM;Decreased knowledge of use of DME;Dexterity;IADL    OT Frequency 2x / week    OT Duration 8 weeks    OT Treatment/Interventions Self-care/ADL training;Therapeutic exercise;Functional Mobility Training;Balance training;Manual Therapy;Neuromuscular education;Ultrasound;Aquatic  Therapy;Energy conservation;Therapeutic activities;DME and/or AE instruction;Paraffin;Cryotherapy;Fluidtherapy;Gait Training;Moist Heat;Contrast Bath;Passive range of  motion;Patient/family education    Plan ADL strategies, functional reaching with elbow extension    Consulted and Agree with Plan of Care Patient           Patient will benefit from skilled therapeutic intervention in order to improve the following deficits and impairments:   Body Structure / Function / Physical Skills: ADL,Balance,Mobility,Strength,Tone,UE functional use,Flexibility,FMC,Coordination,Decreased knowledge of precautions,GMC,ROM,Decreased knowledge of use of DME,Dexterity,IADL       Visit Diagnosis: Other symptoms and signs involving the nervous system  Other lack of coordination  Other symptoms and signs involving the musculoskeletal system  Other abnormalities of gait and mobility  Muscle weakness (generalized)  Abnormal posture    Problem List Patient Active Problem List   Diagnosis Date Noted  . S/P TKR (total knee replacement), right 05/05/2019  . Osteoarthritis of right knee 05/02/2019  . Prostate cancer ([place]) 03/22/2018  . Malignant neoplasm of prostate ([place]) 02/27/2018    Matthew [name] 10/08/2020, 1:34 PM Theone Murdoch, OTR/L [phone] Phone: [phone] 1:37 PM 10/08/20 [place] [address] [place], [place], [address] Phone: [phone]   Fax:  [phone]  Name: Matthew [name] MRN: [id number] Date of Birth: 04/07/1951

## 2020-10-08 NOTE — Therapy (Signed)
[facility] [address] [place], [place], [address] Phone: [phone]   Fax:  [phone]  Speech Language Pathology Treatment  Patient Details  Name: Matthew [name] MRN: [id number] Date of Birth: June 04, 1950 Referring Provider (SLP): Dr. Rexene Alberts   Encounter Date: 10/08/2020   End of Session - 10/08/20 1606    Visit Number 2    Number of Visits 17    Date for SLP Re-Evaluation 12/15/20    SLP Start Time 1150    SLP Stop Time  1230    SLP Time Calculation (min) 40 min    Activity Tolerance Patient tolerated treatment well           Past Medical History:  Diagnosis Date  . Anxiety   . Arthritis   . GERD (gastroesophageal reflux disease)    occ remote history  . Hypertension   . Parkinson disease ([place])   . Prostate cancer ([place])   . Right knee pain    questionable meniscal tear  . Seasonal allergies     Past Surgical History:  Procedure Laterality Date  . COLONOSCOPY    . Leg Laceration Repair  Left    Age 19  . PELVIC LYMPH NODE DISSECTION Bilateral 03/22/2018   Procedure: PELVIC LYMPH NODE DISSECTION;  Surgeon: Ceasar Mons, MD;  [facility];  Service: Urology;  Laterality: Bilateral;  . PROSTATE BIOPSY    . ROBOT ASSISTED LAPAROSCOPIC RADICAL PROSTATECTOMY N/A 03/22/2018   Procedure: XI ROBOTIC ASSISTED LAPAROSCOPIC PROSTATECTOMY;  Surgeon: Ceasar Mons, MD;  [facility];  Service: Urology;  Laterality: N/A;  . TOTAL KNEE ARTHROPLASTY Right 05/05/2019   Procedure: RIGHT TOTAL KNEE ARTHROPLASTY;  Surgeon: Frederik Pear, MD;  [facility];  Service: Orthopedics;  Laterality: Right;    There were no vitals filed for this visit.   Subjective Assessment - 10/08/20 1202    Subjective Pt reports "swallowing too fast sometimes" and coughing with liquids.    Currently in Pain? No/denies                 ADULT SLP TREATMENT - 10/08/20 1203      General  Information   Behavior/Cognition Alert;Cooperative;Pleasant mood      Treatment Provided   Treatment provided Dysphagia      Dysphagia Treatment   Other treatment/comments SLP assessed pt with 6 peanut butter crackers and 1/2 liter of water, without any oral or pharyngeal dysphagia symptoms of note. Pt stated, "That's what happens, it is sort of random."      Cognitive-Linquistic Treatment   Treatment focused on Dysarthria    [place] stated he has performed loud /a/ "some days" since eval, but has not made it routine. SLP suggested pt find something he does each day and pair it with this. [name] said he oculd pair one set of the two /a/ sets with his PT exercises. SLP and pt talked aobut pt's dysphagia and pt began this 10 minute conversation with mid 60s dB and as conversation progressed into talking about louder speech pt's speech incr'd in loudness to mid-upper 60s dB, even some short segments (30-45 seconds, intermittently) of WNL volume.      Assessment / Recommendations / Plan   Plan Continue with current plan of care      Progression Toward Goals   Progression toward goals Progressing toward goals              SLP Short Term Goals - 10/08/20 1609  SLP SHORT TERM GOAL #1   Title pt will produce /a/ with average upper 80s dB at 30 cm over 3 sessions    Time 4    Period Weeks    Status On-going      SLP SHORT TERM GOAL #2   Title pt will demo abdominal breathing with sentences 90% accuracy x 3 sessions    Time 4    Period Weeks    Status On-going      SLP SHORT TERM GOAL #3   Title pt will generate 8 minutes simple conversation with low 70s dB average in 3 sessions    Time 4    Period Weeks    Status On-going      SLP SHORT TERM GOAL #4   Title pt will complete clinical swallowing assessment if indicated.    Time 2    Period Weeks    Status On-going      SLP SHORT TERM GOAL #5   Title pt will undergo objective swallow assessment (MBSS or FEES)  if clinically indicated    Time 4    Period Weeks    Status New            SLP Long Term Goals - 10/08/20 1609      SLP LONG TERM GOAL #1   Title pt will use abdominal breathing >80% of the time during 10 minute simple conversation in 3 sessions    Time 8    Period Weeks   or 17 total visits, for all LTGs   Status On-going      SLP LONG TERM GOAL #2   Title pt will maintain WNL vocal quality/intensity in 10 minute simple conversation outside [facility] room x 3 sessions    Time 8    Period Weeks    Status On-going      SLP LONG TERM GOAL #3   Title pt will report fewer requests to repeat himself than prior to ST    Time 8    Period Weeks    Status On-going      SLP LONG TERM GOAL #4   Title pt will tell SLP 3 overt s/s difficulty oropharyngeal dysphagia with modified independence    Time 8    Period Weeks    Status On-going      SLP LONG TERM GOAL #5   Title pt will report less frequent drooling than prior to eval    Time 8    Period Weeks    Status New            Plan - 10/08/20 1607    Clinical Impression Statement Patient presents with 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 He reports better completion of loud /a/ since eval but not as prescribed. Today SLP assessed pt's swallow function with a package of peanut butter crackers and water, and no overt s/sx dysphagia were noted. SLP to cont monitor swallow function and pt may still require objective swallow eval at some point. I recommend skilled ST to address communication and if necessary, dysphagia, in order to improve pt's communication and quality of life.    Speech Therapy Frequency 2x / week    Duration 8 weeks   or 17 visits   Treatment/Interventions Aspiration precaution training;Language facilitation;Environmental controls;Cueing hierarchy;SLP instruction and feedback;Compensatory techniques;Functional  tasks;Compensatory strategies;Patient/family education;Diet toleration management by SLP;Other (comment)   MBS if warranted   Potential to Achieve  Goals Good    Consulted and Agree with Plan of Care Patient           Patient will benefit from skilled therapeutic intervention in order to improve the following deficits and impairments:   Dysarthria and anarthria  Dysphagia, unspecified type    Problem List Patient Active Problem List   Diagnosis Date Noted  . S/P TKR (total knee replacement), right 05/05/2019  . Osteoarthritis of right knee 05/02/2019  . Prostate cancer ([place]) 03/22/2018  . Malignant neoplasm of prostate ([place]) 02/27/2018    [facility] ,[place], [place]  10/08/2020, 4:10 PM  [facility] [address] [place], [place], [address] Phone: [phone]   Fax:  [phone]   Name: Matthew [name] MRN: [id number] Date of Birth: 30-Dec-1950

## 2020-10-11 ENCOUNTER — Other Ambulatory Visit: Payer: Self-pay | Admitting: Neurology

## 2020-10-12 ENCOUNTER — Ambulatory Visit: Payer: PPO | Admitting: Occupational Therapy

## 2020-10-12 ENCOUNTER — Other Ambulatory Visit: Payer: Self-pay

## 2020-10-12 ENCOUNTER — Encounter: Payer: Self-pay | Admitting: Occupational Therapy

## 2020-10-12 ENCOUNTER — Ambulatory Visit: Payer: PPO | Admitting: Physical Therapy

## 2020-10-12 ENCOUNTER — Encounter: Payer: Self-pay | Admitting: Physical Therapy

## 2020-10-12 ENCOUNTER — Ambulatory Visit: Payer: PPO

## 2020-10-12 ENCOUNTER — Telehealth: Payer: Self-pay

## 2020-10-12 DIAGNOSIS — R278 Other lack of coordination: Secondary | ICD-10-CM

## 2020-10-12 DIAGNOSIS — R471 Dysarthria and anarthria: Secondary | ICD-10-CM

## 2020-10-12 DIAGNOSIS — R293 Abnormal posture: Secondary | ICD-10-CM

## 2020-10-12 DIAGNOSIS — R2681 Unsteadiness on feet: Secondary | ICD-10-CM | POA: Diagnosis not present

## 2020-10-12 DIAGNOSIS — R29818 Other symptoms and signs involving the nervous system: Secondary | ICD-10-CM

## 2020-10-12 DIAGNOSIS — R2689 Other abnormalities of gait and mobility: Secondary | ICD-10-CM

## 2020-10-12 DIAGNOSIS — R1310 Dysphagia, unspecified: Secondary | ICD-10-CM

## 2020-10-12 DIAGNOSIS — R29898 Other symptoms and signs involving the musculoskeletal system: Secondary | ICD-10-CM

## 2020-10-12 NOTE — Therapy (Signed)
[facility] [address] [place], [place], [address] Phone: [phone]   Fax:  [phone]  Physical Therapy Treatment  Patient Details  Name: Matthew [name] MRN: [id number] Date of Birth: 11/23/1950 Referring Provider (PT): Star Age   Encounter Date: 10/12/2020   PT End of Session - 10/12/20 1103    Visit Number 4    Number of Visits 13    Date for PT Re-Evaluation 19/37/90   90 day cert, 6 wk POC; pt wants to wait to schedule all 3 disciplines together   Authorization Type [employer]    Progress Note Due on Visit 10    PT Start Time 1103    PT Stop Time 1145    PT Time Calculation (min) 42 min    Activity Tolerance Patient tolerated treatment well    Behavior During Therapy [facility] for tasks assessed/performed           Past Medical History:  Diagnosis Date  . Anxiety   . Arthritis   . GERD (gastroesophageal reflux disease)    occ remote history  . Hypertension   . Parkinson disease ([place])   . Prostate cancer ([place])   . Right knee pain    questionable meniscal tear  . Seasonal allergies     Past Surgical History:  Procedure Laterality Date  . COLONOSCOPY    . Leg Laceration Repair  Left    Age 74  . PELVIC LYMPH NODE DISSECTION Bilateral 03/22/2018   Procedure: PELVIC LYMPH NODE DISSECTION;  Surgeon: Ceasar Mons, MD;  Location: [facility];  Service: Urology;  Laterality: Bilateral;  . PROSTATE BIOPSY    . ROBOT ASSISTED LAPAROSCOPIC RADICAL PROSTATECTOMY N/A 03/22/2018   Procedure: XI ROBOTIC ASSISTED LAPAROSCOPIC PROSTATECTOMY;  Surgeon: Ceasar Mons, MD;  Location: [facility];  Service: Urology;  Laterality: N/A;  . TOTAL KNEE ARTHROPLASTY Right 05/05/2019   Procedure: RIGHT TOTAL KNEE ARTHROPLASTY;  Surgeon: Frederik Pear, MD;  Location: [facility];  Service: Orthopedics;  Laterality: Right;    There were no vitals filed for this visit.   Subjective Assessment - 10/12/20  1103    Subjective No changes.  Little stressful morning regarding the medication refill process    Pertinent History hx of prostate cancer/removal, incontinence, hx of R TKR 04/2019    Patient Stated Goals Pt's goals are to improve balance and try to avoid my overall slowed mobility.    Currently in Pain? No/denies                  Neuro Re-education:  Pt performs PWR! Moves in standing position x 20 reps   PWR! Up for improved posture  PWR! [name] for improved weighshifting  PWR! Twist for improved trunk rotation   PWR! Step for improved step initiation-cues for increased step height.    Initial cues provided for technique, with pt return demo good understanding.              [facility] Adult PT Treatment/Exercise - 10/12/20 0001      Transfers   Transfers Sit to Stand;Stand to Sit    Sit to Stand 6: Modified independent (Device/Increase time);From chair/3-in-1    Stand to Sit 6: Modified independent (Device/Increase time);To chair/3-in-1    Number of Reps 2 sets;Other reps (comment)   5 reps   Comments cues for activation through quads and gluts for upright posture.      Ambulation/Gait   Ambulation/Gait Yes    Ambulation/Gait Assistance 5:  Supervision    Ambulation/Gait Assistance Details Counting steps in hallway to demo increased step length, effort with gait; started with 14 steps in 20 ft distance, then worked to 10-11 steps 3-4 reps in 20 ft distance.    Ambulation Distance (Feet) 345 Feet   200, 230   Assistive device None    Gait Pattern Step-through pattern;Decreased arm swing - right;Decreased arm swing - left;Decreased step length - right;Decreased step length - left;Right flexed knee in stance;Left flexed knee in stance;Decreased trunk rotation;Trunk flexed    Ambulation Surface Level;Indoor    Gait Comments With gait training today, PT provides cues for pt t relax arms and with focus on step length increase, pt demo reciprocal arm swing.       Self-Care   Self-Care Other Self-Care Comments    Other Self-Care Comments  Provided education on local Parkinson's disease resources, including [employer] group, and [employer] cycling class.  Discussed PWR! Moves exercise class and provided handout for resources.      Neuro Re-ed    Neuro Re-ed Details  PWR! Moves POSTURE FLOW:  seated PWR! Up>Sit to stand PWR! Up>Standing PWR! Up, 5 reps.  Cues for 3 sec hold each position.      Knee/Hip Exercises: Stretches   Active Hamstring Stretch Right;Left;3 reps;30 seconds    Active Hamstring Stretch Limitations Seated at edge of chair, foot propped on chair      Knee/Hip Exercises: Aerobic   Other Aerobic Seated foot pedaler, forward x 5 minutes, mod resistance, then back x 2 minutes                  PT Education - 10/12/20 1200    Education Details [employer] local/online resources information; including [employer] cycling class; encouraged pt again to try bike at clubhouse in neighborhood (he hasn't done it yet)    Person(s) Educated Patient    Methods Explanation;Handout    Comprehension Verbalized understanding            PT Short Term Goals - 10/05/20 1431      PT SHORT TERM GOAL #1   Title Pt will be indepedent with Parkinson's specific HEP to address balance, transfers, gait for improved overall mobility.  TARGET 10/29/2020 (delayed date due to delayed start-Pt wants OT, PT, speech together)    Time 4    Period Weeks    Status New      PT SHORT TERM GOAL #2   Title Pt will perform at least 8 of 10 reps of sit<>stand transfers from < 18 inch surfaces, with no posterior lean or LOB.    Time 4    Period Weeks    Status New      PT SHORT TERM GOAL #3   Title Pt will improve TUG score to less than or equal to 13.5 sec for decreased fall risk.    Baseline 14.69    Time 4    Period Weeks      PT SHORT TERM GOAL #4   Title 6 MWT to be assessed and goal written as appropriate.    Baseline  931' with LTG written on 10/05/20    Time 4    Period Weeks    Status Achieved             PT Long Term Goals - 10/05/20 1432      PT LONG TERM GOAL #1   Title Pt will be independent with progression of  HEP for Parkinson's related deficits.  TARGET 11/19/2020(delayed due to delayed start for coordination of schedules)    Time 6    Period Weeks    Status New      PT LONG TERM GOAL #2   Title Pt will improve 5x sit<>stand to less than or equal to 10 sec for improved transfer efficiency and safety.    Baseline 11.97 sec    Time 6    Period Weeks      PT LONG TERM GOAL #3   Title Pt will improve MiniBESTest score to at least 25/28 for decreased fall risk.    Baseline 23/28    Time 6    Period Weeks    Status New      PT LONG TERM GOAL #4   Title Pt will improve TUG cognitive score to less than or equal to 15 sec for improved dual tasking with gait.    Baseline 15.31 sec    Time 6    Period Weeks    Status New      PT LONG TERM GOAL #5   Title Pt will verbalize plans for continued community fitness upon d/c from PT.    Time 6    Period Weeks    Status New      Additional Long Term Goals   Additional Long Term Goals Yes      PT LONG TERM GOAL #6   Title Pt will improve 6MWT distance by at least 100' in order to demo improved gait endurance for community distances.    Baseline 931' on 10/05/20    Time 6    Period Weeks                 Plan - 10/12/20 1201    Clinical Impression Statement Utilized foot pedaler at beginning of session to help with lower extremity flexibility and aerobic activity; continued to encourage aerobic activity outside of therapy-try bike at clubhouse to work up to stamina to try PD cycling class at [facility].  Pt interested in this and he does report overall imrpoved flexibility after foot pedaler use and stretching at beginning of session.  Reviewed again PWR! Moves and continued to reinforce increased intensity of movement with exercises and  gait today.  Pt overall seems to have improved upright posture during session today and increased step length (with cues, he is able to maintain for about 100 ft).  He will continue to benefit from skilled PT to further address balance, posture, gait for improved overall functional mobility.    Personal Factors and Comorbidities Comorbidity 3+    Comorbidities anxiety, arthritis, GERD, prostate cancer, HTN, s/p R TKR 04/2019    Examination-Activity Limitations Locomotion Level;Transfers;Stand;Stairs    Examination-Participation Restrictions Community Activity;Other   playing with grandchildren   Stability/Clinical Decision Making Evolving/Moderate complexity    Rehab Potential Good    PT Frequency 2x / week    PT Duration 6 weeks   plus eval   PT Treatment/Interventions ADLs/Self Care Home Management;Gait training;Functional mobility training;Therapeutic activities;Therapeutic exercise;Balance training;Neuromuscular re-education;DME Instruction;Patient/family education    PT Next Visit Plan ask if pt did bike at club house and review adding aerobic activity included in HEP; Try standing PWR! Flow and other stepping exercise from HEP, work on exercises for decreased bradykinesia, improved posture; step strategies for balance; try resisted gait; compliant surfaces  Try gait with bilateral walking poles (if weather isn't so hot)    Consulted and Agree with Plan of Care  Patient           Patient will benefit from skilled therapeutic intervention in order to improve the following deficits and impairments:  Abnormal gait,Difficulty walking,Decreased balance,Decreased mobility,Postural dysfunction,Decreased strength,Impaired flexibility  Visit Diagnosis: Other abnormalities of gait and mobility  Unsteadiness on feet  Abnormal posture     Problem List Patient Active Problem List   Diagnosis Date Noted  . S/P TKR (total knee replacement), right 05/05/2019  . Osteoarthritis of right knee  05/02/2019  . Prostate cancer ([place]) 03/22/2018  . Malignant neoplasm of prostate ([place]) 02/27/2018    Matthew [name] W. 10/12/2020, 12:06 PM  Frazier Butt., PT  [facility] [address] [place], [place], [address] Phone: [phone]   Fax:  [phone]  Name: Matthew [name] MRN: [id number] Date of Birth: [birth year]

## 2020-10-12 NOTE — Telephone Encounter (Signed)
Received a refill request for the 2 mg neupro patch yesterday.  Per Dr. Guadelupe Sabin last note check in with the pt should be made before sending refills. During last ov it was discussed about increasing to the 4 mg. Pt and I discussed this morning and he would like to continue the 2 mg patch for now and then reassess at his office visit on 10/20/20 about increasing.  30 day supply for Neuropro patc 2 mg has been sent to [facility].  To note, pt did report some benefit since starting the Neupro. Sts improvement in balance has been noted.

## 2020-10-12 NOTE — Patient Instructions (Signed)
     Overhead Arm Extension - Supine     Lay down.  Hold a ball/shoe box/pillow (shoulder width sized) on tops of legs with arms straight and hands flat on either side. Slowly move arms up/back in an arc with elbows straight and then slowly lower back down. Keep shoulders away from ears. Repeat 15-20 times.

## 2020-10-12 NOTE — Therapy (Signed)
[facility] [address] [place], [place], [address] Phone: [phone]   Fax:  [phone]  Occupational Therapy Treatment  Patient Details  Name: Matthew [name] MRN: [id number] Date of Birth: [birth year] Referring Provider (OT): Dr. Rexene Alberts   Encounter Date: 10/12/2020   OT End of Session - 10/12/20 1219    Visit Number 4    Number of Visits 17    Date for OT Re-Evaluation 11/18/20    Authorization Type [employer]    Authorization Time Period POC writtent for 8 weeks pt scheduled for 6 weeks    Authorization - Visit Number 4    Authorization - Number of Visits 10    OT Start Time 1218    OT Stop Time 1303    OT Time Calculation (min) 45 min    Activity Tolerance Patient tolerated treatment well    Behavior During Therapy [facility] for tasks assessed/performed           Past Medical History:  Diagnosis Date  . Anxiety   . Arthritis   . GERD (gastroesophageal reflux disease)    occ remote history  . Hypertension   . Parkinson disease ([place])   . Prostate cancer ([place])   . Right knee pain    questionable meniscal tear  . Seasonal allergies     Past Surgical History:  Procedure Laterality Date  . COLONOSCOPY    . Leg Laceration Repair  Left    Age 70  . PELVIC LYMPH NODE DISSECTION Bilateral 03/22/2018   Procedure: PELVIC LYMPH NODE DISSECTION;  Surgeon: Ceasar Mons, MD;  [facility];  Service: Urology;  Laterality: Bilateral;  . PROSTATE BIOPSY    . ROBOT ASSISTED LAPAROSCOPIC RADICAL PROSTATECTOMY N/A 03/22/2018   Procedure: XI ROBOTIC ASSISTED LAPAROSCOPIC PROSTATECTOMY;  Surgeon: Ceasar Mons, MD;  [facility];  Service: Urology;  Laterality: N/A;  . TOTAL KNEE ARTHROPLASTY Right 05/05/2019   Procedure: RIGHT TOTAL KNEE ARTHROPLASTY;  Surgeon: Frederik Pear, MD;  [facility];  Service: Orthopedics;  Laterality: Right;    There were no vitals filed for this visit.    Subjective Assessment - 10/12/20 1218    Subjective  Pt reports that he has been having some R shoulder pain and rigidity    Pertinent History PMH:  PD, anxiety, arthritis, GERD, prostate cancer, HTN, s/p R TKR 04/2019    Limitations hx of CA, use caution with modalities.    Currently in Pain? No/denies    Pain Score 8     Pain Location Shoulder    Pain Orientation Right    Pain Descriptors / Indicators Aching    Pain Type Chronic pain    Pain Onset More than a month ago    Pain Frequency Intermittent    Aggravating Factors  reaching    Pain Relieving Factors repositioning             Simulated ADLs with bag with focus/min cues for large amplitude movements:  Donning/doffing pull-over shirt, donning/doffing pants, pulling bag into hand for clothing adjustment/donning socks.  Discussed/instructed pt in how PD symptoms can incr risk of shoulder pain (posture, rigidity, bradykinesia).  Pt instructed to check posture particularly with overhead reaching, and use thumb up position.  Pt reports decr pain with use of these strategies and after stretching.  Sliding cards across table with use of PWR! Hands with each hand with mod difficulty/cueing.          OT Education -  10/12/20 1328    Education Details PWR! up and rock in supine and supine closed-chain shoulder flex--see pt instructions    Person(s) Educated Patient    Methods Explanation;Demonstration;Verbal cues;Handout    Comprehension Verbalized understanding;Returned demonstration;Verbal cues required            OT Short Term Goals - 10/08/20 1043      OT SHORT TERM GOAL #1   Title I with PD specific HEP    Time 4    Period Weeks    Status On-going    Target Date 10/21/20      OT SHORT TERM GOAL #2   Title Pt will verbalize understanding of adapted strategies to maximize safety and I with ADLs/ IADLs .    Time 4    Period Weeks    Status On-going      OT SHORT TERM GOAL #3   Title Pt will demonstrate  improved UE funcitonal use as eviedenced by increaseing box/ blocks by 3 blocks bilaterally.    Baseline RUE 46 LUE 42 blocks ,    Time 4    Period Weeks    Status New      OT SHORT TERM GOAL #4   Title Pt will demonstrate increased ease with dressing as evidenced by decreasing PPT#4(don/ doff jacket) to 25 secs or less    Baseline 32.35    Time 4    Period Weeks    Status On-going      OT SHORT TERM GOAL #5   Title Pt will retrieve a lightweight object from overhead shelf at 120 shoulder flexion with left and right UE's    Time 4    Period Weeks    Status On-going   -5 elbow ext            OT Long Term Goals - 09/16/20 1603      OT LONG TERM GOAL #1   Title Pt will verbalize understanding of ways to prevent future PD related complications and PD community resources following review    Time 8    Period Weeks    Status New    Target Date 11/18/20      OT LONG TERM GOAL #2   Title Pt will demonstrate improved fine motor coordination for ADLs as evidenced by decreasing 9 hole peg test score for RUE by 3 secs    Baseline 38    Time 8    Period Weeks    Status New      OT LONG TERM GOAL #3   Title Pt will demonstrate improved ease with dressing as eveidenced by decreasing 3 button/ unbutton time to 30 secs or less    Baseline 34.09    Time 8    Period Weeks    Status Achieved      OT LONG TERM GOAL #4   Title Pt will report pain in right shoulder no greater than 2/10 for ADLs    Time 8    Period Weeks    Status New      OT LONG TERM GOAL #5   Title .    Time --    Period --                 Plan - 10/12/20 1220    Clinical Impression Statement Pt is progressing towards goals.  He reports decr R shoulder pain with improved positioning and exercise.    OT Occupational Profile and History Detailed Assessment- Review of  Records and additional review of physical, cognitive, psychosocial history related to current functional performance    Occupational  performance deficits (Please refer to evaluation for details): ADL's;IADL's;Leisure;Social Participation    Body Structure / Function / Physical Skills ADL;Balance;Mobility;Strength;Tone;UE functional use;Flexibility;FMC;Coordination;Decreased knowledge of precautions;GMC;ROM;Decreased knowledge of use of DME;Dexterity;IADL    OT Frequency 2x / week    OT Duration 8 weeks    OT Treatment/Interventions Self-care/ADL training;Therapeutic exercise;Functional Mobility Training;Balance training;Manual Therapy;Neuromuscular education;Ultrasound;Aquatic Therapy;Energy conservation;Therapeutic activities;DME and/or AE instruction;Paraffin;Cryotherapy;Fluidtherapy;Gait Training;Moist Heat;Contrast Bath;Passive range of motion;Patient/family education    Plan ADL strategies, functional reaching with elbow extension and cueing for posture/positioning, monitor R shoulder pain/review PWR! up and rock and closed-chain shoulder flex in supine    Consulted and Agree with Plan of Care Patient           Patient will benefit from skilled therapeutic intervention in order to improve the following deficits and impairments:   Body Structure / Function / Physical Skills: ADL,Balance,Mobility,Strength,Tone,UE functional use,Flexibility,FMC,Coordination,Decreased knowledge of precautions,GMC,ROM,Decreased knowledge of use of DME,Dexterity,IADL       Visit Diagnosis: Other symptoms and signs involving the nervous system  Other lack of coordination  Other abnormalities of gait and mobility  Unsteadiness on feet  Other symptoms and signs involving the musculoskeletal system  Abnormal posture    Problem List Patient Active Problem List   Diagnosis Date Noted  . S/P TKR (total knee replacement), right 05/05/2019  . Osteoarthritis of right knee 05/02/2019  . Prostate cancer ([place]) 03/22/2018  . Malignant neoplasm of prostate ([place]) 02/27/2018    [facility] 10/12/2020, 1:46 PM  [facility] [address] [place], [place], [address] Phone: [phone]   Fax:  [phone]  Name: Damarian Priola MRN: [id number] Date of Birth: June 09, 1950   Vianne Bulls, OTR/L [facility] [address] [place], [place]  [address] [phone] phone [phone] 10/12/20 1:46 PM

## 2020-10-12 NOTE — Therapy (Signed)
[facility] [address] [place], [place], [address] Phone: [phone]   Fax:  [phone]  Speech Language Pathology Treatment  Patient Details  Name: Matthew [name] MRN: [id number] Date of Birth: 06-Oct-1950 Referring Provider (SLP): Dr. Rexene Alberts   Encounter Date: 10/12/2020   End of Session - 10/12/20 1102    Visit Number 3    Number of Visits 17    Date for SLP Re-Evaluation 12/15/20    SLP Start Time 84    SLP Stop Time  1100    SLP Time Calculation (min) 40 min    Activity Tolerance Patient tolerated treatment well           Past Medical History:  Diagnosis Date  . Anxiety   . Arthritis   . GERD (gastroesophageal reflux disease)    occ remote history  . Hypertension   . Parkinson disease ([place])   . Prostate cancer ([place])   . Right knee pain    questionable meniscal tear  . Seasonal allergies     Past Surgical History:  Procedure Laterality Date  . COLONOSCOPY    . Leg Laceration Repair  Left    Age 56  . PELVIC LYMPH NODE DISSECTION Bilateral 03/22/2018   Procedure: PELVIC LYMPH NODE DISSECTION;  Surgeon: Ceasar Mons, MD;  Location: [facility];  Service: Urology;  Laterality: Bilateral;  . PROSTATE BIOPSY    . ROBOT ASSISTED LAPAROSCOPIC RADICAL PROSTATECTOMY N/A 03/22/2018   Procedure: XI ROBOTIC ASSISTED LAPAROSCOPIC PROSTATECTOMY;  Surgeon: Ceasar Mons, MD;  Location: [facility];  Service: Urology;  Laterality: N/A;  . TOTAL KNEE ARTHROPLASTY Right 05/05/2019   Procedure: RIGHT TOTAL KNEE ARTHROPLASTY;  Surgeon: Frederik Pear, MD;  Location: [facility];  Service: Orthopedics;  Laterality: Right;    There were no vitals filed for this visit.   Subjective Assessment - 10/12/20 1033    Subjective "I think I have consciously been tryin gto talk louder. I haven't had any food issues in the last 2-3 weeks."    Currently in Pain? No/denies                 ADULT SLP TREATMENT  - 10/12/20 1033      General Information   Behavior/Cognition Alert;Cooperative;Pleasant mood      Treatment Provided   Treatment provided Cognitive-Linquistic      Cognitive-Linquistic Treatment   Treatment focused on Dysarthria    Skilled Treatment Pt states he has been more consistent with loud /a/ and sentences. He has been pairing them with the BIG exercises and trying to pair his evening HEP with his shower but has not been totally successful with this. In a 15 minute conversation pt average was upper 60s dB - average decreased over time, until SLP provided pt nonverbal cue. SLP took digital recorder and recorded the next 8 minutes, pt's conversation averaged in low 70s dB. SLP used this recording to reinforce pt is not shouting as he stated he felt like he sometimes talks "too loudly" when he is focusing on effort/loudness with his speech. Pt agreed it did not sound like he was shouting or talking too loudly. homework is to find something to pair second set of /a/ and sentences with.      Assessment / Recommendations / Plan   Plan Continue with current plan of care      Progression Toward Goals   Progression toward goals Progressing toward goals  SLP Education - 10/12/20 1101    Education Details good he is more consistent - goal is to be consistently BID with /a/ and sentences    Person(s) Educated Patient    Methods Explanation    Comprehension Verbalized understanding            SLP Short Term Goals - 10/12/20 1103      SLP SHORT TERM GOAL #1   Title pt will produce /a/ with average upper 80s dB at 30 cm over 3 sessions    Time 3    Period Weeks    Status On-going      SLP SHORT TERM GOAL #2   Title pt will demo abdominal breathing with sentences 90% accuracy x 3 sessions    Time 3    Period Weeks    Status On-going      SLP SHORT TERM GOAL #3   Title pt will generate 8 minutes simple conversation with low 70s dB average in 3 sessions    Baseline  10-12-20    Time 3    Period Weeks    Status On-going      SLP SHORT TERM GOAL #4   Title pt will complete clinical swallowing assessment if indicated.    Status Achieved      SLP SHORT TERM GOAL #5   Title pt will undergo objective swallow assessment (MBSS or FEES) if clinically indicated    Time 4    Period Weeks    Status On-going            SLP Long Term Goals - 10/12/20 1104      SLP LONG TERM GOAL #1   Title pt will use abdominal breathing >80% of the time during 10 minute simple conversation in 3 sessions    Time 7    Period Weeks   or 17 total visits, for all LTGs   Status On-going      SLP LONG TERM GOAL #2   Title pt will maintain WNL vocal quality/intensity in 10 minute simple conversation outside [place] room x 3 sessions    Time 7    Period Weeks    Status On-going      SLP LONG TERM GOAL #3   Title pt will report fewer requests to repeat himself than prior to ST    Time 7    Period Weeks    Status On-going      SLP LONG TERM GOAL #4   Title pt will tell SLP 3 overt s/s difficulty oropharyngeal dysphagia with modified independence    Time 7    Period Weeks    Status On-going      SLP LONG TERM GOAL #5   Title pt will report less frequent drooling than prior to eval    Time 7    Period Weeks    Status On-going            Plan - 10/12/20 1102    Clinical Impression Statement Patient presents with 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 He reports better completion of loud /a/ and sentences since last session but still not BID consistently. SLP to cont monitor swallow function and pt may still require objective swallow eval at some point. I recommend skilled ST to address communication and if necessary, dysphagia, in order to improve pt's communication and quality of life.    Speech Therapy Frequency 2x / week  Duration 8 weeks   or 17 visits    Treatment/Interventions Aspiration precaution training;Language facilitation;Environmental controls;Cueing hierarchy;SLP instruction and feedback;Compensatory techniques;Functional tasks;Compensatory strategies;Patient/family education;Diet toleration management by SLP;Other (comment)   MBS if warranted   Potential to Achieve Goals Good    Consulted and Agree with Plan of Care Patient           Patient will benefit from skilled therapeutic intervention in order to improve the following deficits and impairments:   Dysarthria and anarthria  Dysphagia, unspecified type    Problem List Patient Active Problem List   Diagnosis Date Noted  . S/P TKR (total knee replacement), right 05/05/2019  . Osteoarthritis of right knee 05/02/2019  . Prostate cancer ([place]) 03/22/2018  . Malignant neoplasm of prostate ([place]) 02/27/2018    [facility] ,[place], [place]  10/12/2020, 11:04 AM  [facility] [address] [place], [place], [address] Phone: [phone]   Fax:  [phone]   Name: Matthew [name] MRN: [id number] Date of Birth: Jul 23, 1950

## 2020-10-14 ENCOUNTER — Encounter: Payer: Self-pay | Admitting: Physical Therapy

## 2020-10-14 ENCOUNTER — Ambulatory Visit: Payer: PPO | Admitting: Occupational Therapy

## 2020-10-14 ENCOUNTER — Encounter: Payer: Self-pay | Admitting: Occupational Therapy

## 2020-10-14 ENCOUNTER — Ambulatory Visit: Payer: PPO | Admitting: Physical Therapy

## 2020-10-14 ENCOUNTER — Ambulatory Visit: Payer: PPO

## 2020-10-14 ENCOUNTER — Other Ambulatory Visit: Payer: Self-pay

## 2020-10-14 DIAGNOSIS — R2681 Unsteadiness on feet: Secondary | ICD-10-CM | POA: Diagnosis not present

## 2020-10-14 DIAGNOSIS — R1310 Dysphagia, unspecified: Secondary | ICD-10-CM

## 2020-10-14 DIAGNOSIS — R29818 Other symptoms and signs involving the nervous system: Secondary | ICD-10-CM

## 2020-10-14 DIAGNOSIS — R2689 Other abnormalities of gait and mobility: Secondary | ICD-10-CM

## 2020-10-14 DIAGNOSIS — M6281 Muscle weakness (generalized): Secondary | ICD-10-CM

## 2020-10-14 DIAGNOSIS — R293 Abnormal posture: Secondary | ICD-10-CM

## 2020-10-14 DIAGNOSIS — R471 Dysarthria and anarthria: Secondary | ICD-10-CM

## 2020-10-14 DIAGNOSIS — R29898 Other symptoms and signs involving the musculoskeletal system: Secondary | ICD-10-CM

## 2020-10-14 DIAGNOSIS — R278 Other lack of coordination: Secondary | ICD-10-CM

## 2020-10-14 NOTE — Patient Instructions (Signed)
  Please complete the assigned speech therapy homework prior to your next session - 15 minutes x2/day or once for 20 minutes/day.

## 2020-10-14 NOTE — Therapy (Signed)
[facility] [address] [place], [place], [address] Phone: [phone]   Fax:  [phone]  Occupational Therapy Treatment  Patient Details  Name: Matthew [name] MRN: [id number] Date of Birth: [birth year] Referring Provider (OT): Dr. Rexene Alberts   Encounter Date: 10/14/2020   OT End of Session - 10/14/20 1302    Visit Number 5    Number of Visits 17    Date for OT Re-Evaluation 11/18/20    Authorization Type [employer]    Authorization Time Period POC writtent for 8 weeks pt scheduled for 6 weeks    Authorization - Visit Number 5    Authorization - Number of Visits 10    OT Start Time 1318    OT Stop Time 1400    OT Time Calculation (min) 42 min    Activity Tolerance Patient tolerated treatment well    Behavior During Therapy [facility] for tasks assessed/performed           Past Medical History:  Diagnosis Date  . Anxiety   . Arthritis   . GERD (gastroesophageal reflux disease)    occ remote history  . Hypertension   . Parkinson disease ([place])   . Prostate cancer ([place])   . Right knee pain    questionable meniscal tear  . Seasonal allergies     Past Surgical History:  Procedure Laterality Date  . COLONOSCOPY    . Leg Laceration Repair  Left    Age 70  . PELVIC LYMPH NODE DISSECTION Bilateral 03/22/2018   Procedure: PELVIC LYMPH NODE DISSECTION;  Surgeon: Ceasar Mons, MD;  [facility];  Service: Urology;  Laterality: Bilateral;  . PROSTATE BIOPSY    . ROBOT ASSISTED LAPAROSCOPIC RADICAL PROSTATECTOMY N/A 03/22/2018   Procedure: XI ROBOTIC ASSISTED LAPAROSCOPIC PROSTATECTOMY;  Surgeon: Ceasar Mons, MD;  [facility];  Service: Urology;  Laterality: N/A;  . TOTAL KNEE ARTHROPLASTY Right 05/05/2019   Procedure: RIGHT TOTAL KNEE ARTHROPLASTY;  Surgeon: Frederik Pear, MD;  [facility];  Service: Orthopedics;  Laterality: Right;    There were no vitals filed for this visit.    Subjective Assessment - 10/14/20 1302    Subjective  Pt reports that R knee is bothering him some today.    Pertinent History PMH:  PD, anxiety, arthritis, GERD, prostate cancer, HTN, s/p R TKR 04/2019    Limitations hx of CA, use caution with modalities.    Currently in Pain? Yes    Pain Score 5     Pain Location Knee    Pain Orientation Right    Pain Descriptors / Indicators Aching;Tightness    Pain Type Chronic pain    Pain Onset More than a month ago    Pain Frequency Intermittent    Aggravating Factors  moving a lot    Pain Relieving Factors rest    Effect of Pain on Daily Activities sit, stretching           Reviewed Supine, PWR! Up and rock, closed-chain shoulder flex with pool noodle with min cueing for positioning and large amplitude   Sitting, closed-chain shoulder movement (with head/eye movement) with foam noodle Floor>overhead, then shoulder abduction/diagonals with trunk rotation and head/eye movements.  Educated pt on importance/use of head/associated trunk movements and eye movements with reaching help to decr risk of future complications (shoulder pain, improved balance, and visual deficits).   Sitting, functional reaching with lateral wt. Shift/trunk rotation with each UE to grasp/release cylinder objects with min  cueing with RUE for large amplitude for grasp and trunk rotation, head/eye/trunk movements.     Sitting, trunk rotation and wt. Shifts to perform functional reaching laterally and overhead to place clothespins on vertical pole with min-mod cueing for associated trunk and head movements and PWR! Hands to decr risk of shoulder pain, improve bradykinesia and rigidity.       OT Short Term Goals - 10/08/20 1043      OT SHORT TERM GOAL #1   Title I with PD specific HEP    Time 4    Period Weeks    Status On-going    Target Date 10/21/20      OT SHORT TERM GOAL #2   Title Pt will verbalize understanding of adapted strategies to maximize safety and I  with ADLs/ IADLs .    Time 4    Period Weeks    Status On-going      OT SHORT TERM GOAL #3   Title Pt will demonstrate improved UE funcitonal use as eviedenced by increaseing box/ blocks by 3 blocks bilaterally.    Baseline RUE 46 LUE 42 blocks ,    Time 4    Period Weeks    Status New      OT SHORT TERM GOAL #4   Title Pt will demonstrate increased ease with dressing as evidenced by decreasing PPT#4(don/ doff jacket) to 25 secs or less    Baseline 32.35    Time 4    Period Weeks    Status On-going      OT SHORT TERM GOAL #5   Title Pt will retrieve a lightweight object from overhead shelf at 120 shoulder flexion with left and right UE's    Time 4    Period Weeks    Status On-going   -5 elbow ext            OT Long Term Goals - 09/16/20 1603      OT LONG TERM GOAL #1   Title Pt will verbalize understanding of ways to prevent future PD related complications and PD community resources following review    Time 8    Period Weeks    Status New    Target Date 11/18/20      OT LONG TERM GOAL #2   Title Pt will demonstrate improved fine motor coordination for ADLs as evidenced by decreasing 9 hole peg test score for RUE by 3 secs    Baseline 38    Time 8    Period Weeks    Status New      OT LONG TERM GOAL #3   Title Pt will demonstrate improved ease with dressing as eveidenced by decreasing 3 button/ unbutton time to 30 secs or less    Baseline 34.09    Time 8    Period Weeks    Status Achieved      OT LONG TERM GOAL #4   Title Pt will report pain in right shoulder no greater than 2/10 for ADLs    Time 8    Period Weeks    Status New      OT LONG TERM GOAL #5   Title .    Time --    Period --                 Plan - 10/14/20 1303    Clinical Impression Statement Pt is progressing towards goals.  Pt reports decr R shoulder pain and stiffness after supine exercises  and proper positioning.    OT Occupational Profile and History Detailed Assessment-  Review of Records and additional review of physical, cognitive, psychosocial history related to current functional performance    Occupational performance deficits (Please refer to evaluation for details): ADL's;IADL's;Leisure;Social Participation    Body Structure / Function / Physical Skills ADL;Balance;Mobility;Strength;Tone;UE functional use;Flexibility;FMC;Coordination;Decreased knowledge of precautions;GMC;ROM;Decreased knowledge of use of DME;Dexterity;IADL    OT Frequency 2x / week    OT Duration 8 weeks    OT Treatment/Interventions Self-care/ADL training;Therapeutic exercise;Functional Mobility Training;Balance training;Manual Therapy;Neuromuscular education;Ultrasound;Aquatic Therapy;Energy conservation;Therapeutic activities;DME and/or AE instruction;Paraffin;Cryotherapy;Fluidtherapy;Gait Training;Moist Heat;Contrast Bath;Passive range of motion;Patient/family education    Plan begin checking STGs next week; ADL strategies, functional reaching with elbow extension and cueing for posture/positioning, monitor R shoulder pain    OT Home Exercise Plan Supine PWR! up and rock, closed-chain shoulder flex in supine    Consulted and Agree with Plan of Care Patient           Patient will benefit from skilled therapeutic intervention in order to improve the following deficits and impairments:   Body Structure / Function / Physical Skills: ADL,Balance,Mobility,Strength,Tone,UE functional use,Flexibility,FMC,Coordination,Decreased knowledge of precautions,GMC,ROM,Decreased knowledge of use of DME,Dexterity,IADL       Visit Diagnosis: Other symptoms and signs involving the nervous system  Other lack of coordination  Other abnormalities of gait and mobility  Unsteadiness on feet  Other symptoms and signs involving the musculoskeletal system  Abnormal posture    Problem List Patient Active Problem List   Diagnosis Date Noted  . S/P TKR (total knee replacement), right 05/05/2019  .  Osteoarthritis of right knee 05/02/2019  . Prostate cancer ([place]) 03/22/2018  . Malignant neoplasm of prostate ([place]) 02/27/2018    [facility] 10/14/2020, 2:14 PM  [name] [name] [place], [place], [address] Phone: [phone]   Fax:  [phone]  Name: Matthew [name] MRN: [id number] Date of Birth: [birth year]   Vianne Bulls, OTR/L [facility] [address] [place], [place]  [address] [phone] phone [phone] 10/14/20 2:14 PM

## 2020-10-14 NOTE — Therapy (Signed)
[facility] [address] [place], [place], [address] Phone: [phone]   Fax:  [phone]  Speech Language Pathology Treatment  Patient Details  Name: Matthew [name] MRN: [id number] Date of Birth: Oct 29, 1950 Referring Provider (SLP): Dr. Rexene Alberts   Encounter Date: 10/14/2020   End of Session - 10/14/20 1538    Visit Number 4    Number of Visits 17    Date for SLP Re-Evaluation 12/15/20    SLP Start Time 6553    SLP Stop Time  1445    SLP Time Calculation (min) 40 min    Activity Tolerance Patient tolerated treatment well           Past Medical History:  Diagnosis Date  . Anxiety   . Arthritis   . GERD (gastroesophageal reflux disease)    occ remote history  . Hypertension   . Parkinson disease ([name])   . Prostate cancer ([place])   . Right knee pain    questionable meniscal tear  . Seasonal allergies     Past Surgical History:  Procedure Laterality Date  . COLONOSCOPY    . Leg Laceration Repair  Left    Age 70  . PELVIC LYMPH NODE DISSECTION Bilateral 03/22/2018   Procedure: PELVIC LYMPH NODE DISSECTION;  Surgeon: Ceasar Mons, MD;  [facility];  Service: Urology;  Laterality: Bilateral;  . PROSTATE BIOPSY    . ROBOT ASSISTED LAPAROSCOPIC RADICAL PROSTATECTOMY N/A 03/22/2018   Procedure: XI ROBOTIC ASSISTED LAPAROSCOPIC PROSTATECTOMY;  Surgeon: Ceasar Mons, MD;  [facility];  Service: Urology;  Laterality: N/A;  . TOTAL KNEE ARTHROPLASTY Right 05/05/2019   Procedure: RIGHT TOTAL KNEE ARTHROPLASTY;  Surgeon: Frederik Pear, MD;  [facility];  Service: Orthopedics;  Laterality: Right;    There were no vitals filed for this visit.   Subjective Assessment - 10/14/20 1425    Currently in Pain? Yes    Pain Score 5     Pain Location Knee    Pain Orientation Right    Pain Descriptors / Indicators Aching;Tightness    Pain Type Chronic pain    Pain Onset More than a month  ago                 ADULT SLP TREATMENT - 10/14/20 1511      General Information   Behavior/Cognition Alert;Cooperative;Pleasant mood      Treatment Provided   Treatment provided Cognitive-Linquistic      Cognitive-Linquistic Treatment   Treatment focused on Dysarthria    Skilled Treatment SLP had pt perform loud /a/ and everyday sentences in order to habitualize louder conversational speech. Average noted at low90s dB, Everyday sentences were said with average mid-80s dB. In short conversation pt averaged low 70s dB however abdominal breathing (AB) was not noted. SLP focused on word responses with AB, which pt had 90% success. SLP incr'd difficulty to sentence level and pt demonstrated 85% success and average upper 60s - lower 70s dB. Homework was provided for AB and louder speech with sentence responses.      Assessment / Recommendations / Plan   Plan Continue with current plan of care      Progression Toward Goals   Progression toward goals Progressing toward goals              SLP Short Term Goals - 10/14/20 1538      SLP SHORT TERM GOAL #1   Title pt will produce /a/ with average upper  80s dB at 30 cm over 3 sessions    Baseline 10-14-20    Time 3    Period Weeks    Status On-going      SLP SHORT TERM GOAL #2   Title pt will demo abdominal breathing with sentences 90% accuracy x 3 sessions    Time 3    Period Weeks    Status On-going      SLP SHORT TERM GOAL #3   Title pt will generate 8 minutes simple conversation with low 70s dB average in 3 sessions    Baseline 10-12-20    Time 3    Period Weeks    Status On-going      SLP SHORT TERM GOAL #4   Title pt will complete clinical swallowing assessment if indicated.    Status Achieved      SLP SHORT TERM GOAL #5   Title pt will undergo objective swallow assessment (MBSS or FEES) if clinically indicated    Time 4    Period Weeks    Status On-going            SLP Long Term Goals - 10/14/20 1539       SLP LONG TERM GOAL #1   Title pt will use abdominal breathing >80% of the time during 10 minute simple conversation in 3 sessions    Time 7    Period Weeks   or 17 total visits, for all LTGs   Status On-going      SLP LONG TERM GOAL #2   Title pt will maintain WNL vocal quality/intensity in 10 minute simple conversation outside [place] room x 3 sessions    Time 7    Period Weeks    Status On-going      SLP LONG TERM GOAL #3   Title pt will report fewer requests to repeat himself than prior to ST    Time 7    Period Weeks    Status On-going      SLP LONG TERM GOAL #4   Title pt will tell SLP 3 overt s/s difficulty oropharyngeal dysphagia with modified independence    Time 7    Period Weeks    Status On-going      SLP LONG TERM GOAL #5   Title pt will report less frequent drooling than prior to eval    Time 7    Period Weeks    Status On-going            Plan - 10/14/20 1538    Clinical Impression Statement Patient presents with 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 He reports better completion of loud /a/ and sentences since last session but still not BID consistently. SLP to cont monitor swallow function and pt may still require objective swallow eval at some point. I recommend skilled ST to address communication and if necessary, dysphagia, in order to improve pt's communication and quality of life.    Speech Therapy Frequency 2x / week    Duration 8 weeks   or 17 visits   Treatment/Interventions Aspiration precaution training;Language facilitation;Environmental controls;Cueing hierarchy;SLP instruction and feedback;Compensatory techniques;Functional tasks;Compensatory strategies;Patient/family education;Diet toleration management by SLP;Other (comment)   MBS if warranted   Potential to Achieve Goals Good    Consulted and Agree with Plan of Care Patient           Patient  will benefit from skilled therapeutic intervention in order to  improve the following deficits and impairments:   Dysarthria and anarthria  Dysphagia, unspecified type    Problem List Patient Active Problem List   Diagnosis Date Noted  . S/P TKR (total knee replacement), right 05/05/2019  . Osteoarthritis of right knee 05/02/2019  . Prostate cancer ([place]) 03/22/2018  . Malignant neoplasm of prostate ([place]) 02/27/2018    [facility] ,[place], [place]  10/14/2020, 3:39 PM  [facility] [address] [place], [place], [address] Phone: [phone]   Fax:  [phone]   Name: Matthew [name] MRN: [id number] Date of Birth: [birth year]

## 2020-10-14 NOTE — Therapy (Signed)
[facility] [address] [place], [place], [address] Phone: [phone]   Fax:  [phone]  Physical Therapy Treatment  Patient Details  Name: Matthew [name] MRN: [id number] Date of Birth: [birth year] Referring Provider (PT): Star Age   Encounter Date: 10/14/2020   PT End of Session - 10/14/20 1237    Visit Number 5    Number of Visits 13    Date for PT Re-Evaluation 69/53/66   90 day cert, 6 wk POC; pt wants to wait to schedule all 3 disciplines together   Authorization Type [employer]    Progress Note Due on Visit 10    PT Start Time 1238    PT Stop Time 1316    PT Time Calculation (min) 38 min    Activity Tolerance Patient tolerated treatment well    Behavior During Therapy [facility] for tasks assessed/performed           Past Medical History:  Diagnosis Date  . Anxiety   . Arthritis   . GERD (gastroesophageal reflux disease)    occ remote history  . Hypertension   . Parkinson disease ([place])   . Prostate cancer ([place])   . Right knee pain    questionable meniscal tear  . Seasonal allergies     Past Surgical History:  Procedure Laterality Date  . COLONOSCOPY    . Leg Laceration Repair  Left    Age 70  . PELVIC LYMPH NODE DISSECTION Bilateral 03/22/2018   Procedure: PELVIC LYMPH NODE DISSECTION;  Surgeon: Ceasar Mons, MD;  Location: [facility];  Service: Urology;  Laterality: Bilateral;  . PROSTATE BIOPSY    . ROBOT ASSISTED LAPAROSCOPIC RADICAL PROSTATECTOMY N/A 03/22/2018   Procedure: XI ROBOTIC ASSISTED LAPAROSCOPIC PROSTATECTOMY;  Surgeon: Ceasar Mons, MD;  Location: [facility];  Service: Urology;  Laterality: N/A;  . TOTAL KNEE ARTHROPLASTY Right 05/05/2019   Procedure: RIGHT TOTAL KNEE ARTHROPLASTY;  Surgeon: Frederik Pear, MD;  Location: [facility];  Service: Orthopedics;  Laterality: Right;    There were no vitals filed for this visit.   Subjective Assessment - 10/14/20  1228    Subjective Doing good.  No changes.  Tried the bike at the clubhouse for about 10 minutes.    Pertinent History hx of prostate cancer/removal, incontinence, hx of R TKR 04/2019    Patient Stated Goals Pt's goals are to improve balance and try to avoid my overall slowed mobility.    Currently in Pain? No/denies                             [facility] Adult PT Treatment/Exercise - 10/14/20 0001      Ambulation/Gait   Ambulation/Gait Yes    Ambulation/Gait Assistance 5: Supervision    Ambulation/Gait Assistance Details Using bilateral walking poles, indoor and then outdoor surfaces.    Ambulation Distance (Feet) 230 Feet   800 with walking poles outdoors   Assistive device None    Gait Pattern Step-through pattern;Decreased arm swing - right;Decreased arm swing - left;Decreased step length - right;Decreased step length - left;Right flexed knee in stance;Left flexed knee in stance;Decreased trunk rotation;Trunk flexed    Ambulation Surface Level;Indoor    Gait Comments Gait training with bilat walking poles with verbal, visual, audio cues for sequencing.  Additional extra practice up and down gentle inclines 3 reps, with cues for pacea nd sequence.   Pt better able to stop and self-correct sequence  on straight away and able to make it full length of parking lot without having to reset.  Cues for increased step length and pole placement.      Exercises   Exercises Knee/Hip    Other Exercises  Forward step up, single leg, x 10 reps each; then forward step taps>back into runner's stretch position, x 5 reps each side.  Cues for terminal knee extension.      Knee/Hip Exercises: Aerobic   Stepper SciFit  BUEs/BLEs, x 8 minutes, Level 2.0>2.4, initially steps/min at 75-78, then with cues, maintains steps/min >85-95 throughout, for aerobic activity.  Reviewed importance of incorporating aerobic activity into current exercise routine.  Rates effort as 5-6/10.  Explained to increase to  effort level of 7/10                    PT Short Term Goals - 10/05/20 1431      PT SHORT TERM GOAL #1   Title Pt will be indepedent with Parkinson's specific HEP to address balance, transfers, gait for improved overall mobility.  TARGET 10/29/2020 (delayed date due to delayed start-Pt wants OT, PT, speech together)    Time 4    Period Weeks    Status New      PT SHORT TERM GOAL #2   Title Pt will perform at least 8 of 10 reps of sit<>stand transfers from < 18 inch surfaces, with no posterior lean or LOB.    Time 4    Period Weeks    Status New      PT SHORT TERM GOAL #3   Title Pt will improve TUG score to less than or equal to 13.5 sec for decreased fall risk.    Baseline 14.69    Time 4    Period Weeks      PT SHORT TERM GOAL #4   Title 6 MWT to be assessed and goal written as appropriate.    Baseline 931' with LTG written on 10/05/20    Time 4    Period Weeks    Status Achieved             PT Long Term Goals - 10/05/20 1432      PT LONG TERM GOAL #1   Title Pt will be independent with progression of HEP for Parkinson's related deficits.  TARGET 11/19/2020(delayed due to delayed start for coordination of schedules)    Time 6    Period Weeks    Status New      PT LONG TERM GOAL #2   Title Pt will improve 5x sit<>stand to less than or equal to 10 sec for improved transfer efficiency and safety.    Baseline 11.97 sec    Time 6    Period Weeks      PT LONG TERM GOAL #3   Title Pt will improve MiniBESTest score to at least 25/28 for decreased fall risk.    Baseline 23/28    Time 6    Period Weeks    Status New      PT LONG TERM GOAL #4   Title Pt will improve TUG cognitive score to less than or equal to 15 sec for improved dual tasking with gait.    Baseline 15.31 sec    Time 6    Period Weeks    Status New      PT LONG TERM GOAL #5   Title Pt will verbalize plans for continued community fitness upon d/c from  PT.    Time 6    Period Weeks     Status New      Additional Long Term Goals   Additional Long Term Goals Yes      PT LONG TERM GOAL #6   Title Pt will improve 6MWT distance by at least 100' in order to demo improved gait endurance for community distances.    Baseline 931' on 10/05/20    Time 6    Period Weeks                 Plan - 10/14/20 1320    Clinical Impression Statement Pt has begun to use bike at clubhouse of his neighborhood, and PT continued to encourage this with further education and use of SciFit stepper in session today.  Also focused on gait training with bilateral walking poles on outdoor surfaces, with pt having improved sequencing ability and recognizing when to self-correct.  He continues to need cues for increased step length, slowed pace, and coordination of poles at times.  He will continue to benefit from skilled PT to further address balance, posture, gait for improved overall functional mobility.    Personal Factors and Comorbidities Comorbidity 3+    Comorbidities anxiety, arthritis, GERD, prostate cancer, HTN, s/p R TKR 04/2019    Examination-Activity Limitations Locomotion Level;Transfers;Stand;Stairs    Examination-Participation Restrictions Community Activity;Other   playing with grandchildren   Stability/Clinical Decision Making Evolving/Moderate complexity    Rehab Potential Good    PT Frequency 2x / week    PT Duration 6 weeks   plus eval   PT Treatment/Interventions ADLs/Self Care Home Management;Gait training;Functional mobility training;Therapeutic activities;Therapeutic exercise;Balance training;Neuromuscular re-education;DME Instruction;Patient/family education    PT Next Visit Plan ?use of bike over the holiday weekend?;  Try standing PWR! Flow and other stepping exercise from HEP, work on exercises for decreased bradykinesia, improved posture; step strategies for balance; try resisted gait; compliant surfaces  Try gait with bilateral walking poles on outdoor, hilly surfaces     Consulted and Agree with Plan of Care Patient           Patient will benefit from skilled therapeutic intervention in order to improve the following deficits and impairments:  Abnormal gait,Difficulty walking,Decreased balance,Decreased mobility,Postural dysfunction,Decreased strength,Impaired flexibility  Visit Diagnosis: Other abnormalities of gait and mobility  Muscle weakness (generalized)  Unsteadiness on feet  Abnormal posture     Problem List Patient Active Problem List   Diagnosis Date Noted  . S/P TKR (total knee replacement), right 05/05/2019  . Osteoarthritis of right knee 05/02/2019  . Prostate cancer ([place]) 03/22/2018  . Malignant neoplasm of prostate ([place]) 02/27/2018    Marella Vanderpol W. 10/14/2020, 1:24 PM  Frazier Butt., PT   [facility] [address] [place], [place], [address] Phone: [phone]   Fax:  [phone]  Name: Matthew [name] MRN: [id number] Date of Birth: 1950-12-06

## 2020-10-19 ENCOUNTER — Ambulatory Visit: Payer: PPO | Admitting: Occupational Therapy

## 2020-10-19 ENCOUNTER — Ambulatory Visit: Payer: PPO | Admitting: Physical Therapy

## 2020-10-19 ENCOUNTER — Encounter: Payer: Self-pay | Admitting: Occupational Therapy

## 2020-10-19 ENCOUNTER — Other Ambulatory Visit: Payer: Self-pay

## 2020-10-19 ENCOUNTER — Ambulatory Visit: Payer: PPO

## 2020-10-19 ENCOUNTER — Encounter: Payer: Self-pay | Admitting: Physical Therapy

## 2020-10-19 DIAGNOSIS — R2681 Unsteadiness on feet: Secondary | ICD-10-CM

## 2020-10-19 DIAGNOSIS — R29818 Other symptoms and signs involving the nervous system: Secondary | ICD-10-CM

## 2020-10-19 DIAGNOSIS — R2689 Other abnormalities of gait and mobility: Secondary | ICD-10-CM

## 2020-10-19 DIAGNOSIS — R293 Abnormal posture: Secondary | ICD-10-CM

## 2020-10-19 DIAGNOSIS — R1310 Dysphagia, unspecified: Secondary | ICD-10-CM

## 2020-10-19 DIAGNOSIS — R29898 Other symptoms and signs involving the musculoskeletal system: Secondary | ICD-10-CM

## 2020-10-19 DIAGNOSIS — R278 Other lack of coordination: Secondary | ICD-10-CM

## 2020-10-19 DIAGNOSIS — R471 Dysarthria and anarthria: Secondary | ICD-10-CM

## 2020-10-19 DIAGNOSIS — I10 Essential (primary) hypertension: Secondary | ICD-10-CM | POA: Diagnosis not present

## 2020-10-19 NOTE — Therapy (Signed)
[facility] [address] [place], [place], [address] Phone: [phone]   Fax:  [phone]  Occupational Therapy Treatment  Patient Details  Name: Matthew [name] MRN: [id number] Date of Birth: [birth year] Referring Provider (OT): Dr. Rexene Alberts   Encounter Date: 10/19/2020   OT End of Session - 10/19/20 1322    Visit Number 6    Number of Visits 17    Date for OT Re-Evaluation 11/18/20    Authorization Type [employer]    Authorization Time Period POC writtent for 8 weeks pt scheduled for 6 weeks    Authorization - Visit Number 6    Authorization - Number of Visits 10    OT Start Time 1319    OT Stop Time 1400    OT Time Calculation (min) 41 min    Activity Tolerance Patient tolerated treatment well    Behavior During Therapy [facility] for tasks assessed/performed           Past Medical History:  Diagnosis Date  . Anxiety   . Arthritis   . GERD (gastroesophageal reflux disease)    occ remote history  . Hypertension   . Parkinson disease ([place])   . Prostate cancer ([place])   . Right knee pain    questionable meniscal tear  . Seasonal allergies     Past Surgical History:  Procedure Laterality Date  . COLONOSCOPY    . Leg Laceration Repair  Left    Age 70  . PELVIC LYMPH NODE DISSECTION Bilateral 03/22/2018   Procedure: PELVIC LYMPH NODE DISSECTION;  Surgeon: Ceasar Mons, MD;  [facility];  Service: Urology;  Laterality: Bilateral;  . PROSTATE BIOPSY    . ROBOT ASSISTED LAPAROSCOPIC RADICAL PROSTATECTOMY N/A 03/22/2018   Procedure: XI ROBOTIC ASSISTED LAPAROSCOPIC PROSTATECTOMY;  Surgeon: Ceasar Mons, MD;  [facility];  Service: Urology;  Laterality: N/A;  . TOTAL KNEE ARTHROPLASTY Right 05/05/2019   Procedure: RIGHT TOTAL KNEE ARTHROPLASTY;  Surgeon: Frederik Pear, MD;  [facility];  Service: Orthopedics;  Laterality: Right;    There were no vitals filed for this visit.    Subjective Assessment - 10/19/20 1320    Subjective  Pt reports that he had trouble walking in neighborhood up hills, where he could do them last fall.    Pertinent History PMH:  PD, anxiety, arthritis, GERD, prostate cancer, HTN, s/p R TKR 04/2019    Limitations hx of CA, use caution with modalities.    Currently in Pain? No/denies    Pain Onset More than a month ago            Sliding card across table with use of PWR! Hands with each hand with min-mod cues for incr effort/intensity/movement size.  Encouraged pt to slowly build up walking in the neighborhood and do it consistently in mornings or evenings.   Pt reports hip pain with walking up hills.  Also recommended pt discuss/try walking poles with PT to assist with posture and ease (as posture may be contributing to pain).  Standing, functional reaching with trunk rotation to flip cards with large amplitude movements with emphasis/cueing for posture, supination/shoulder ER, scapular depression, and finger ext.    Standing, tossing beanbags to target with focus/cues on balance, large amplitude movement, wt. Shift (with RUE).   Began checking STGs (see below) and discussed progress.   Standing, closed-chain shoulder flex (floor>overhead) and diagonals to each side with min cueing for positioning, wt. Shift, posture, feet apart.  OT Short Term Goals - 10/19/20 1342      OT SHORT TERM GOAL #1   Title I with PD specific HEP    Time 4    Period Weeks    Status On-going    Target Date 10/21/20      OT SHORT TERM GOAL #2   Title Pt will verbalize understanding of adapted strategies to maximize safety and I with ADLs/ IADLs .    Time 4    Period Weeks    Status On-going      OT SHORT TERM GOAL #3   Title Pt will demonstrate improved UE funcitonal use as eviedenced by increaseing box/ blocks by 3 blocks bilaterally.    Baseline RUE 46 LUE 42 blocks ,    Time 4    Period Weeks    Status On-going   Partially met 10/19/20:   R-44blocks, L-45blocks (met with LUE, not met with RUE)     OT SHORT TERM GOAL #4   Title Pt will demonstrate increased ease with dressing as evidenced by decreasing PPT#4(don/ doff jacket) to 25 secs or less    Baseline 32.35    Time 4    Period Weeks    Status On-going      OT SHORT TERM GOAL #5   Title Pt will retrieve a lightweight object from overhead shelf at 120 shoulder flexion with left and right UE's    Time 4    Period Weeks    Status Achieved   10/19/20:  LUE 140* (-5* elbow ext), RUE 130* (-10* elbow ext)            OT Long Term Goals - 09/16/20 1603      OT LONG TERM GOAL #1   Title Pt will verbalize understanding of ways to prevent future PD related complications and PD community resources following review    Time 8    Period Weeks    Status New    Target Date 11/18/20      OT LONG TERM GOAL #2   Title Pt will demonstrate improved fine motor coordination for ADLs as evidenced by decreasing 9 hole peg test score for RUE by 3 secs    Baseline 38    Time 8    Period Weeks    Status New      OT LONG TERM GOAL #3   Title Pt will demonstrate improved ease with dressing as eveidenced by decreasing 3 button/ unbutton time to 30 secs or less    Baseline 34.09    Time 8    Period Weeks    Status Achieved      OT LONG TERM GOAL #4   Title Pt will report pain in right shoulder no greater than 2/10 for ADLs    Time 8    Period Weeks    Status New      OT LONG TERM GOAL #5   Title .    Time --    Period --                 Plan - 10/19/20 1326    Clinical Impression Statement Pt is progressing towards goals.  Pt demo improved shoulder ROM, but continues to need min cueing for posture/proper positioning.    OT Occupational Profile and History Detailed Assessment- Review of Records and additional review of physical, cognitive, psychosocial history related to current functional performance    Occupational performance deficits (Please refer to evaluation  for  details): ADL's;IADL's;Leisure;Social Participation    Body Structure / Function / Physical Skills ADL;Balance;Mobility;Strength;Tone;UE functional use;Flexibility;FMC;Coordination;Decreased knowledge of precautions;GMC;ROM;Decreased knowledge of use of DME;Dexterity;IADL    OT Frequency 2x / week    OT Duration 8 weeks    OT Treatment/Interventions Self-care/ADL training;Therapeutic exercise;Functional Mobility Training;Balance training;Manual Therapy;Neuromuscular education;Ultrasound;Aquatic Therapy;Energy conservation;Therapeutic activities;DME and/or AE instruction;Paraffin;Cryotherapy;Fluidtherapy;Gait Training;Moist Heat;Contrast Bath;Passive range of motion;Patient/family education    Plan check remaining STGs; ADL strategies, functional reaching with elbow extension and cueing for posture/positioning, monitor R shoulder pain    OT Home Exercise Plan Supine PWR! up and rock, closed-chain shoulder flex in supine    Consulted and Agree with Plan of Care Patient           Patient will benefit from skilled therapeutic intervention in order to improve the following deficits and impairments:   Body Structure / Function / Physical Skills: ADL,Balance,Mobility,Strength,Tone,UE functional use,Flexibility,FMC,Coordination,Decreased knowledge of precautions,GMC,ROM,Decreased knowledge of use of DME,Dexterity,IADL       Visit Diagnosis: Other symptoms and signs involving the nervous system  Other lack of coordination  Other abnormalities of gait and mobility  Unsteadiness on feet  Other symptoms and signs involving the musculoskeletal system  Abnormal posture    Problem List Patient Active Problem List   Diagnosis Date Noted  . S/P TKR (total knee replacement), right 05/05/2019  . Osteoarthritis of right knee 05/02/2019  . Prostate cancer ([place]) 03/22/2018  . Malignant neoplasm of prostate ([place]) 02/27/2018    [facility] 10/19/2020, 2:26 PM  [facility] [address] [place], [place], [address] Phone: [phone]   Fax:  [phone]  Name: Matthew [name] MRN: [id number] Date of Birth: 07-Mar-1951   Vianne Bulls, OTR/L [facility] [address] [place], [place]  [address] [phone] phone [phone] 10/19/20 2:26 PM

## 2020-10-19 NOTE — Therapy (Signed)
[facility] [address] [place], [place], [address] Phone: [phone]   Fax:  [phone]  Physical Therapy Treatment  Patient Details  Name: Matthew [name] MRN: [id number] Date of Birth: [birth year] Referring Provider (PT): Star Age   Encounter Date: 10/19/2020   PT End of Session - 10/19/20 1619    Visit Number 6    Number of Visits 13    Date for PT Re-Evaluation 12/45/80   90 day cert, 6 wk POC; pt wants to wait to schedule all 3 disciplines together   Authorization Type [employer]    Progress Note Due on Visit 10    PT Start Time 1448    PT Stop Time 1529    PT Time Calculation (min) 41 min    Activity Tolerance Patient tolerated treatment well    Behavior During Therapy [facility] for tasks assessed/performed           Past Medical History:  Diagnosis Date  . Anxiety   . Arthritis   . GERD (gastroesophageal reflux disease)    occ remote history  . Hypertension   . Parkinson disease ([place])   . Prostate cancer ([place])   . Right knee pain    questionable meniscal tear  . Seasonal allergies     Past Surgical History:  Procedure Laterality Date  . COLONOSCOPY    . Leg Laceration Repair  Left    Age 70  . PELVIC LYMPH NODE DISSECTION Bilateral 03/22/2018   Procedure: PELVIC LYMPH NODE DISSECTION;  Surgeon: Ceasar Mons, MD;  Location: [facility];  Service: Urology;  Laterality: Bilateral;  . PROSTATE BIOPSY    . ROBOT ASSISTED LAPAROSCOPIC RADICAL PROSTATECTOMY N/A 03/22/2018   Procedure: XI ROBOTIC ASSISTED LAPAROSCOPIC PROSTATECTOMY;  Surgeon: Ceasar Mons, MD;  Location: [facility];  Service: Urology;  Laterality: N/A;  . TOTAL KNEE ARTHROPLASTY Right 05/05/2019   Procedure: RIGHT TOTAL KNEE ARTHROPLASTY;  Surgeon: Frederik Pear, MD;  Location: [facility];  Service: Orthopedics;  Laterality: Right;    There were no vitals filed for this visit.   Subjective Assessment - 10/19/20  1451    Subjective Tried going on a walk with his wife over the weekend. Took a lot out of him and it was really warm. Walked about 10 minutes.    Pertinent History hx of prostate cancer/removal, incontinence, hx of R TKR 04/2019    Patient Stated Goals Pt's goals are to improve balance and try to avoid my overall slowed mobility.    Currently in Pain? No/denies                             [facility] Adult PT Treatment/Exercise - 10/19/20 1454      Ambulation/Gait   Ambulation/Gait Yes    Ambulation/Gait Assistance 5: Supervision    Ambulation/Gait Assistance Details using B walking poles over indoor level and outdoor concrete surfaces. verbal and demo cues for proper technique. cues for tall posture, incr step length and proper sequencing/pole placement. during 1st bout of gait outdoors, pt able to stop and reset and continue with gait due to becoming off sequence. during 2nd bout of gait outdoors pt able to ambulate entire length of parking lot without being off sequence, with pt reporting really focusing on pole placement with RUE and larger step lengs with RLE    Ambulation Distance (Feet) 345 Feet   x1, 600 x 1   Assistive device None;Other (  Comment)   B walking poles   Gait Pattern Step-through pattern;Decreased arm swing - right;Decreased arm swing - left;Decreased step length - right;Decreased step length - left;Right flexed knee in stance;Left flexed knee in stance;Decreased trunk rotation;Trunk flexed    Ambulation Surface Level;Indoor    Gait Comments discussed gradually incr walking distance when ambulating at home (pt now starting at 10 minutes). showed where to purchase walking poles from [employer] to use for outdoor distance gait at home, will continue to practice with poles in therapy      Knee/Hip Exercises: Aerobic   Stepper SciFit  BUEs/BLEs, x 7 minutes, Level 2.4 cues to maintain steps/min >85-90 throughout, for aerobic activity.  reviewed trying to rate effort  level as a 7/10 when performing and to use RPE/level of effort when performing seated recumbent bike in his neighborhood clubhouse               Balance Exercises - 10/19/20 1526      Balance Exercises: Standing   Stepping Strategy Posterior;Lateral;Foam/compliant surface;10 reps;Limitations    Stepping Strategy Limitations performed at countertop on blue air ex, cues for having a wider BOS when stepping back to midline and for weight shift, begin with UE support and then none               PT Short Term Goals - 10/05/20 1431      PT SHORT TERM GOAL #1   Title Pt will be indepedent with Parkinson's specific HEP to address balance, transfers, gait for improved overall mobility.  TARGET 10/29/2020 (delayed date due to delayed start-Pt wants OT, PT, speech together)    Time 4    Period Weeks    Status New      PT SHORT TERM GOAL #2   Title Pt will perform at least 8 of 10 reps of sit<>stand transfers from < 18 inch surfaces, with no posterior lean or LOB.    Time 4    Period Weeks    Status New      PT SHORT TERM GOAL #3   Title Pt will improve TUG score to less than or equal to 13.5 sec for decreased fall risk.    Baseline 14.69    Time 4    Period Weeks      PT SHORT TERM GOAL #4   Title 6 MWT to be assessed and goal written as appropriate.    Baseline 931' with LTG written on 10/05/20    Time 4    Period Weeks    Status Achieved             PT Long Term Goals - 10/05/20 1432      PT LONG TERM GOAL #1   Title Pt will be independent with progression of HEP for Parkinson's related deficits.  TARGET 11/19/2020(delayed due to delayed start for coordination of schedules)    Time 6    Period Weeks    Status New      PT LONG TERM GOAL #2   Title Pt will improve 5x sit<>stand to less than or equal to 10 sec for improved transfer efficiency and safety.    Baseline 11.97 sec    Time 6    Period Weeks      PT LONG TERM GOAL #3   Title Pt will improve MiniBESTest  score to at least 25/28 for decreased fall risk.    Baseline 23/28    Time 6    Period Weeks  Status New      PT LONG TERM GOAL #4   Title Pt will improve TUG cognitive score to less than or equal to 15 sec for improved dual tasking with gait.    Baseline 15.31 sec    Time 6    Period Weeks    Status New      PT LONG TERM GOAL #5   Title Pt will verbalize plans for continued community fitness upon d/c from PT.    Time 6    Period Weeks    Status New      Additional Long Term Goals   Additional Long Term Goals Yes      PT LONG TERM GOAL #6   Title Pt will improve 6MWT distance by at least 100' in order to demo improved gait endurance for community distances.    Baseline 931' on 10/05/20    Time 6    Period Weeks                 Plan - 10/19/20 1620    Clinical Impression Statement Continued to focus on gait training today with bilateral walking poles on indoor and outdoor surfaces. Does need initial cues for proper cane placement and having a wider BOS. When performing gait outdoors - pt needing to reset due to becoming off sequence. Cues needed at times for slowed pace, during 2nd bout of gait outdoors on concrete, pt able to perform throughout parking lot with proper sequencing and not needing to stop and reset. Will continue to progress towards LTGs.    Personal Factors and Comorbidities Comorbidity 3+    Comorbidities anxiety, arthritis, GERD, prostate cancer, HTN, s/p R TKR 04/2019    Examination-Activity Limitations Locomotion Level;Transfers;Stand;Stairs    Examination-Participation Restrictions Community Activity;Other   playing with grandchildren   Stability/Clinical Decision Making Evolving/Moderate complexity    Rehab Potential Good    PT Frequency 2x / week    PT Duration 6 weeks   plus eval   PT Treatment/Interventions ADLs/Self Care Home Management;Gait training;Functional mobility training;Therapeutic activities;Therapeutic exercise;Balance  training;Neuromuscular re-education;DME Instruction;Patient/family education    PT Next Visit Plan how is bike? Try standing PWR! Flow and other stepping exercise from HEP, work on exercises for decreased bradykinesia, improved posture; step strategies for balance; try resisted gait; continue to gait with bilateral walking poles on outdoor, hilly surfaces    Consulted and Agree with Plan of Care Patient           Patient will benefit from skilled therapeutic intervention in order to improve the following deficits and impairments:  Abnormal gait,Difficulty walking,Decreased balance,Decreased mobility,Postural dysfunction,Decreased strength,Impaired flexibility  Visit Diagnosis: Other symptoms and signs involving the nervous system  Other abnormalities of gait and mobility  Unsteadiness on feet     Problem List Patient Active Problem List   Diagnosis Date Noted  . S/P TKR (total knee replacement), right 05/05/2019  . Osteoarthritis of right knee 05/02/2019  . Prostate cancer ([place]) 03/22/2018  . Malignant neoplasm of prostate ([place]) 02/27/2018    Arliss Journey, PT, DPT  10/19/2020, 4:22 PM  [facility] [address] [place], [place], [address] Phone: [phone]   Fax:  [phone]  Name: Matthew [name] MRN: [id number] Date of Birth: 01/31/1951

## 2020-10-19 NOTE — Therapy (Signed)
[facility] [address] [place], [place], [address] Phone: [phone]   Fax:  [phone]  Speech Language Pathology Treatment  Patient Details  Name: Matthew [name] MRN: [id number] Date of Birth: [birth year] Referring Provider (SLP): Dr. Rexene Alberts   Encounter Date: 10/19/2020   End of Session - 10/19/20 1717    Visit Number 5    Number of Visits 17    Date for SLP Re-Evaluation 12/15/20    SLP Start Time 2992    SLP Stop Time  1445    SLP Time Calculation (min) 40 min    Activity Tolerance Patient tolerated treatment well           Past Medical History:  Diagnosis Date  . Anxiety   . Arthritis   . GERD (gastroesophageal reflux disease)    occ remote history  . Hypertension   . Parkinson disease ([place])   . Prostate cancer ([place])   . Right knee pain    questionable meniscal tear  . Seasonal allergies     Past Surgical History:  Procedure Laterality Date  . COLONOSCOPY    . Leg Laceration Repair  Left    Age 70  . PELVIC LYMPH NODE DISSECTION Bilateral 03/22/2018   Procedure: PELVIC LYMPH NODE DISSECTION;  Surgeon: Ceasar Mons, MD;  [facility]: [facility];  Service: Urology;  Laterality: Bilateral;  . PROSTATE BIOPSY    . ROBOT ASSISTED LAPAROSCOPIC RADICAL PROSTATECTOMY N/A 03/22/2018   Procedure: XI ROBOTIC ASSISTED LAPAROSCOPIC PROSTATECTOMY;  Surgeon: Ceasar Mons, MD;  [facility];  Service: Urology;  Laterality: N/A;  . TOTAL KNEE ARTHROPLASTY Right 05/05/2019   Procedure: RIGHT TOTAL KNEE ARTHROPLASTY;  Surgeon: Frederik Pear, MD;  [facility];  Service: Orthopedics;  Laterality: Right;    There were no vitals filed for this visit.   Subjective Assessment - 10/19/20 1427    Subjective "I mainly did the "ah"s consistently and everything else was done- just not consistently."    Currently in Pain? No/denies                 ADULT SLP TREATMENT - 10/19/20 1428       General Information   Behavior/Cognition Alert;Cooperative;Pleasant mood      Treatment Provided   Treatment provided Cognitive-Linquistic      Cognitive-Linquistic Treatment   Treatment focused on Dysarthria    Skilled Treatment SLP had pt perform loud /a/ and everyday sentences in order to habitualize louder conversational speech. Average noted in low 90s dB, Everyday sentences were said with average mid-80s dB. In short conversation pt averaged upper 60s to low 70s dB in high -interest topics. SLP pointed out to patient that he will have to master the loudness and abdominal breathing (AB) even with high interest topics.      Assessment / Recommendations / Plan   Plan Continue with current plan of care      Progression Toward Goals   Progression toward goals Progressing toward goals              SLP Short Term Goals - 10/19/20 1718      SLP SHORT TERM GOAL #1   Title pt will produce /a/ with average upper 80s dB at 30 cm over 3 sessions    Baseline 10-14-20    Time 2    Period Weeks    Status On-going      SLP SHORT TERM GOAL #2   Title pt will demo abdominal  breathing with sentences 90% accuracy x 3 sessions    Time 2    Period Weeks    Status On-going      SLP SHORT TERM GOAL #3   Title pt will generate 8 minutes simple conversation with low 70s dB average in 3 sessions    Baseline 10-12-20    Time 2    Period Weeks    Status On-going      SLP SHORT TERM GOAL #4   Title pt will complete clinical swallowing assessment if indicated.    Status Achieved      SLP SHORT TERM GOAL #5   Title pt will undergo objective swallow assessment (MBSS or FEES) if clinically indicated    Time 4    Period Weeks    Status On-going            SLP Long Term Goals - 10/19/20 1719      SLP LONG TERM GOAL #1   Title pt will use abdominal breathing >80% of the time during 10 minute simple conversation in 3 sessions    Time 6    Period Weeks   or 17 total visits, for all LTGs    Status On-going      SLP LONG TERM GOAL #2   Title pt will maintain WNL vocal quality/intensity in 10 minute simple conversation outside [place] room x 3 sessions    Time 6    Period Weeks    Status On-going      SLP LONG TERM GOAL #3   Title pt will report fewer requests to repeat himself than prior to ST    Time 6    Period Weeks    Status On-going      SLP LONG TERM GOAL #4   Title pt will tell SLP 3 overt s/s difficulty oropharyngeal dysphagia with modified independence    Time 6    Period Weeks    Status On-going      SLP LONG TERM GOAL #5   Title pt will report less frequent drooling than prior to eval    Time 6    Period Weeks    Status On-going            Plan - 10/19/20 1718    Clinical Impression Statement Patient presents with 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 He reports better completion of loud /a/ but everyday sentences and homework were not copmleted with the frequency suggested by SLP. SLP to cont monitor swallow function and pt may still require objective swallow eval at some point. I recommend skilled ST to address communication and if necessary, dysphagia, in order to improve pt's communication and quality of life.    Speech Therapy Frequency 2x / week    Duration 8 weeks   or 17 visits   Treatment/Interventions Aspiration precaution training;Language facilitation;Environmental controls;Cueing hierarchy;SLP instruction and feedback;Compensatory techniques;Functional tasks;Compensatory strategies;Patient/family education;Diet toleration management by SLP;Other (comment)   MBS if warranted   Potential to Achieve Goals Good    Consulted and Agree with Plan of Care Patient           Patient will benefit from skilled therapeutic intervention in order to improve the following deficits and impairments:   Dysarthria and anarthria  Dysphagia, unspecified  type    Problem List Patient Active Problem List   Diagnosis Date Noted  . S/P TKR (total knee replacement), right 05/05/2019  . Osteoarthritis  of right knee 05/02/2019  . Prostate cancer ([place]) 03/22/2018  . Malignant neoplasm of prostate ([place]) 02/27/2018    [facility] ,[place], [place]  10/19/2020, 5:19 PM  [facility] [address] [place], [place], [address] Phone: [phone]   Fax:  [phone]   Name: Matthew [name] MRN: [id number] Date of Birth: 03/17/1951

## 2020-10-20 ENCOUNTER — Ambulatory Visit: Payer: PPO | Admitting: Neurology

## 2020-10-20 ENCOUNTER — Encounter: Payer: Self-pay | Admitting: Neurology

## 2020-10-20 VITALS — BP 100/63 | HR 69 | Ht 66.0 in | Wt 167.0 lb

## 2020-10-20 DIAGNOSIS — G20 Parkinson's disease: Secondary | ICD-10-CM | POA: Diagnosis not present

## 2020-10-20 DIAGNOSIS — K5909 Other constipation: Secondary | ICD-10-CM | POA: Diagnosis not present

## 2020-10-20 DIAGNOSIS — Z9181 History of falling: Secondary | ICD-10-CM

## 2020-10-20 DIAGNOSIS — G20C Parkinsonism, unspecified: Secondary | ICD-10-CM

## 2020-10-20 MED ORDER — NEUPRO 4 MG/24HR TD PT24: 1.0000 | MEDICATED_PATCH | Freq: Every day | TRANSDERMAL | 3 refills | Status: DC

## 2020-10-20 NOTE — Patient Instructions (Signed)
It was good to see you both again today.  As discussed, we will increase your Neupro patch to 4 mg strength.  For now, for the next week, try to use 2 of the 2 mg patches at a time to see if you tolerate 4 mg.  Please remember that the 4 mg patch itself is bigger and some of the side effects may relate to dose increase.  We will keep your levodopa the same for now.  Please try to be proactive about constipation issues and uses stool softener as needed, even laxative if needed.  Try to hydrate better with water and try to exercise in the form of walking or bicycling if you can.   Please try to stand up slowly and turns slowly especially in a confined space such as your garage or closet or in the bathroom and kitchen.  Follow-up routinely in 4 months, sooner if needed.  If you would like, you can give [place] an update in about a week or 2 as to how you are feeling on the 4 mg total dose by combining 2 of the 2 mg patches.  Once you are on the actual 4 mg patch, let us know and if things are going well I can change it to the 90-day prescription for long-term use.

## 2020-10-20 NOTE — Progress Notes (Signed)
Subjective:    Patient ID: Matthew [name] is a 70 y.o. male.  HPI     Interim history:   Matthew [name] is a 70 year old right-handed gentleman with an underlying medical history of hypertension, reflux disease, arthritis with status post right total knee replacement, seasonal allergies, anxiety, and prostate cancer, status post prostate surgery in November 2019 and status post XRT in 2020, on Lupron inj, who presents for follow-up consultation of his right-sided parkinsonism.  The patient is accompanied by his wife again today. I last saw him for a sooner than scheduled appointment on 08/25/2020, at which time he reported more slowness and stiffness.  He had recently been diagnosed and treated for COVID, received 3 doses of remdesivir.  He had more difficulty walking.  He was taking Sinemet 2 pills 3 times a day.  He was advised to increase this to 2 pills 4 times a day and advised to start Neupro patch 2 mg strength.  He had had side effects on ropinirole before but we talked about expectations with the Neupro patch and he was advised to let us know if he wanted to increase it to the 4 mg before the next refill.  He requested to stay on 2 mg.  Today, 10/20/2020: He reports having noticed just improvement in his tremor and dexterity after starting the Neupro.  He tolerates it, perhaps just a little bit of tiredness from it but nothing severe.  He has not had as many or frequent dream enactment type behaviors, but he still struggles with constipation.  He takes Metamucil and also stool softener as needed, they do have a laxative on hand.  He does not always hydrate well with water per wife, she estimates that he drinks about 3 cups of water per day.  She noticed that his fine motor skills slightly improved after he started the Neupro.  He has not had any problems tolerating the patch, no skin reaction. He has had therapy through neuro rehab.  He sustained a fall in April and had a laceration to the left ear  which needed stitches.  I reviewed the emergency room record.  The patient's allergies, current medications, family history, past medical history, past social history, past surgical history and problem list were reviewed and updated as appropriate.    Previously:    I saw him on 04/21/2020, at which time he reported that he was able to tolerate levodopa, he was taking 2 pills 3 times daily.  He reported improvement in his tremor and mobility.  He had some dream enactment behavior for which he was advised to take melatonin.  He had ongoing issues with intermittent constipation.  He had reevaluations pending with neuro rehab in March 2022.    His wife called in the interim in February 2022 reporting that he was diagnosed with COVID.  They were advised to seek medical attention as he had symptoms.     I saw him on 01/14/2020, at which time he felt about the same.  He reported ongoing issues with urinary incontinence.  He had stopped taking melatonin for dream enactment behaviors.  He felt drowsy from it so he stopped it.  He had also stopped Requip because of sleepiness.  He was on Sinemet 1 pill 3 times daily without any telltale response noted.  He was advised to increase it gradually to 2 pills 3 times daily.     I saw him on 10/14/2019, at which time he was off the ropinirole.  He  had side effects and these improved as he tapered off of it.  He had not had any recent falls but he did have a history of dream enactment behaviors and he was advised to start melatonin at night for sleep.  In addition, I suggested a trial of low-dose Sinemet generic with gradual increase.  I also advised him to seek evaluations through neuro rehab.  I made a referral to outpatient PT, OT and speech therapy.   I first met him on 08/05/2019 at the request of his primary care physician, at which time the patient reported an approximately 2 or 2+ year history of right-sided fine motor dyscontrol particularly affect is in his  right hand when he was playing the guitar.  His examination was in keeping with parkinsonism.  I suggested we consider medication in the form of a dopamine agonist.  He was advised to proceed with a DaTscan. He called in the interim on 08/20/2019, and was ready to start ropinirole with gradual titration. He had a DaTscan on 10/02/2019 and I reviewed the results: IMPRESSION: Decreased radiotracer accumulation within the LEFT and RIGHT striatum with faint retention of within the heads of the caudate nuclei. The activity is decreased to a point that drug interference should be considered. 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   We called him with the test results.    His wife called in the interim earlier this month because he had side effects with the ropinirole and I advised him to taper off of the medication.     08/05/19: (He) reports problems with his fine motor skills and coordination for the past 2+ years.  He feels that his symptoms are primarily noted in the right hand.  He has not had any tremors but has had difficulty with buttoning and also when playing the guitar.  He has been playing the guitar off and on for over 50 years.  He picked it up again after he retired some 2-1/2 years ago and noticed that he was having trouble.  He has had some issues with arthritis in the hands and attributed most of his difficulty to arthritis.  He has noticed stiffness.  In addition, he has noticed slowness as an it takes him longer to do things.  He has had some balance problems but no recent fall with the exception of a recent incident at home when he slipped off of a yoga exercise ball, he fell but did not injure himself.  I reviewed your telemedicine note from 03/10/2019 as well as office note from 07/04/2018.  He denies any mood related issues or cognitive issues.  He sleeps fairly well but does have vivid dreams and reports acting out in his  dreams from time to time.  Once he fell out of bed, his wife has noted that he mumbles or talks in his sleep and also moves his limbs while asleep.  This has been going on for years as he recalls.  He does not smoke any cigarettes except for some smoking when he was a teenager reported.  He does smoke the pipe, 1 or 2/day.  He drinks alcohol in the form of some liquor, 1-2 servings per day on average.  He drinks caffeine in the form of coffee.  He had a knee replacement surgery last year.  He no longer takes any narcotic pain medication but does take ibuprofen 800 mg 2 or 3 times a day as needed.  He  is on Lupron.  His Past Medical History Is Significant For: Past Medical History:  Diagnosis Date  . Anxiety   . Arthritis   . GERD (gastroesophageal reflux disease)    occ remote history  . Hypertension   . Parkinson disease ([place])   . Prostate cancer ([place])   . Right knee pain    questionable meniscal tear  . Seasonal allergies     His Past Surgical History Is Significant For: Past Surgical History:  Procedure Laterality Date  . COLONOSCOPY    . Leg Laceration Repair  Left    Age 2  . PELVIC LYMPH NODE DISSECTION Bilateral 03/22/2018   Procedure: PELVIC LYMPH NODE DISSECTION;  Surgeon: Ceasar Mons, MD;  Location: [facility];  Service: Urology;  Laterality: Bilateral;  . PROSTATE BIOPSY    . ROBOT ASSISTED LAPAROSCOPIC RADICAL PROSTATECTOMY N/A 03/22/2018   Procedure: XI ROBOTIC ASSISTED LAPAROSCOPIC PROSTATECTOMY;  Surgeon: Ceasar Mons, MD;  Location: [facility];  Service: Urology;  Laterality: N/A;  . TOTAL KNEE ARTHROPLASTY Right 05/05/2019   Procedure: RIGHT TOTAL KNEE ARTHROPLASTY;  Surgeon: Frederik Pear, MD;  Location: [facility];  Service: Orthopedics;  Laterality: Right;    His Family History Is Significant For: Family History  Problem Relation Age of Onset  . Breast cancer Mother        30s  . Heart attack Father 71  . Colon cancer Maternal Grandmother   .  Prostate cancer Neg Hx   . Pancreatic cancer Neg Hx     His Social History Is Significant For: Social History   Socioeconomic History  . Marital status: Married    Spouse name: Not on file  . Number of children: 2  . Years of education: Not on file  . Highest education level: Not on file  Occupational History  . Not on file  Tobacco Use  . Smoking status: Current Every Day Smoker    Years: 20.00    Types: Pipe  . Smokeless tobacco: Never Used  Vaping Use  . Vaping Use: Never used  Substance and Sexual Activity  . Alcohol use: Yes    Alcohol/week: 1.0 - 2.0 standard drink    Types: 1 - 2 Shots of liquor per week    Comment: per day  . Drug use: Never  . Sexual activity: Not Currently  Other Topics Concern  . Not on file  Social History Narrative  . Not on file   Social Determinants of Health   Financial Resource Strain: Not on file  Food Insecurity: Not on file  Transportation Needs: Not on file  Physical Activity: Not on file  Stress: Not on file  Social Connections: Not on file    His Allergies Are:  Allergies  Allergen Reactions  . Other     Senior dose of flu shot, cause hear palpitations, anxious  . Peanut-Containing Drug Products Other (See Comments)    Nasal congestion  :   His Current Medications Are:  Outpatient Encounter Medications as of 10/20/2020  Medication Sig  . carbidopa-levodopa (SINEMET IR) 25-100 MG tablet Take 2 tablets by mouth 4 (four) times daily. 2 pills 3 times a day (Patient taking differently: Take 2 tablets by mouth 4 (four) times daily.)  . fluticasone (FLONASE) 50 MCG/ACT nasal spray Place 1-2 sprays into both nostrils daily as needed for allergies or rhinitis.  Marland Kitchen lisinopril (ZESTRIL) 20 MG tablet Take 20 mg by mouth daily.  Marland Kitchen loratadine (CLARITIN) 10 MG tablet Take 10 mg  by mouth daily.  . meloxicam (MOBIC) 15 MG tablet Take 1 tablet by mouth daily as needed.  . mirabegron ER (MYRBETRIQ) 50 MG TB24 tablet Take 50 mg by mouth  daily.  Marland Kitchen NEUPRO 2 MG/24HR place ONE PATCH onto THE SKIN ONCE DAILY  . OVER THE COUNTER MEDICATION Take 4-6 tablets by mouth in the morning, at noon, and at bedtime. RESTORE GOLD 6 tablet in am, 6 tab at lunch and 4 tablet in evening  . [DISCONTINUED] ibuprofen (ADVIL) 800 MG tablet Take 800 mg by mouth in the morning and at bedtime. (Patient not taking: Reported on 10/20/2020)   No facility-administered encounter medications on file as of 10/20/2020.  :  Review of Systems:  Out of a complete 14 point review of systems, all are reviewed and negative with the exception of these symptoms as listed below: Review of Systems  Neurological:       Here for f/u on PD. Reports 1 fall since last visit which resulted in a trip to the ED for stitches in left ear.  Pt would like to disucss Neupro patch increase.    Objective:  Neurological Exam  Physical Exam Physical Examination:   Vitals:   10/20/20 1413  BP: 100/63  Pulse: 69    General Examination: The patient is a very pleasant 70 y.o. male in no acute distress. He appears well-developed and well-nourished and well groomed.   HEENT:Normocephalic, atraumatic, pupils are equal, round and reactive to light,moderate nuchal rigidity, extraocular tracking is mildly impaired. He has mild to moderate facial masking with decrease in eye blink rate. Hearing is intact, no significant leaning of his upper body noted. He has no significant lip or jaw tremor. He has mild to moderate hypophonia, slight raspiness in his voice today. Airway examination reveals mild mouth dryness, no significant drooling, tongue protrudes centrally and palate elevates symmetrically.   Chest:Clear to auscultation without wheezing, rhonchi or crackles noted.  Heart:S1+S2+0, regular and normal without murmurs, rubs or gallops noted.   Abdomen:Soft, non-tender and non-distended with normal bowel sounds appreciated on auscultation.  Extremities:There istraceedema  in the R ankle, stable to mildly improved.  Skin: Warm and dry without trophic changes noted.  Musculoskeletal: exam revealsunremarkable right knee replacement scar.   Neurologically:  Mental status: The patient is awake, alert and oriented in all 4 spheres.Hisimmediate and remote memory, attention, language skills and fund of knowledge are appropriate. There is no evidence of aphasia, agnosia, apraxia or anomia. Thought process is linear. Mood is normaland affect is normal.  Slight slowness in his thinking at times. Cranial nerves II - XII are as described above under HEENT exam. In addition: shoulder shrug is normal with equal shoulder height noted. Motor exam: Normal bulk, strength. There is no drift,resting, postural or actiontremor.  (On3/16/2021:On Archimedes spiral drawing he has no significant difficulty with the right hand, slight insecurity noted with the left hand, handwriting is legible for the most part but micrographic.)  Fine motor skills and coordination:He has fine motor difficulty bilaterally, right more than left, rightin the moderate range, left in the mild to moderate range.He has moderate bradykinesia. No dyskinesias noted. Tone is increased in both upper extremities. Gait,station and balance:He stands up with mild difficulty and pushes himself up. Posture ismildly stooped, has decreased arm swing bilaterally,right more so than left. No obvious shuffling noted.  Cerebellar testing: No dysmetria or intention tremor. There is no truncal or gait ataxia.  Sensory exam: intact to light touch in the upper and  lower extremities.  Assessmentand Plan:   In summary,Matthew Hiltonis a very pleasant 58 year oldmalewith an underlying medical history of hypertension, reflux disease, arthritiswith status post right total knee replacement, seasonal allergies, anxiety, and prostate cancer, status post prostate surgery in November 2019 and status post XRT  in 2020, whopresents for follow-up consultation of his parkinsonism with symptoms dating back to 2-1/2 to 3 years ago with lateralization to the right.   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  He had noted some benefit from ropinirole but side effects outweighed the benefits - hehad significant exacerbation of his urinary incontinence and severe somnolence from it.  He is on Lupron injections. He is in close follow-up with urology.  He has significant nocturia and also urinary incontinence, currently on Myrbetriq.  Constipation has been an issue and we talked about the importance of constipation control and being proactive about it.  He is advised to continue to try to stay active, he is advised to try to include walking exercises or bicycling to his day-to-day activity.  He is advised to stay better hydrated with water.  He was started on Neupro patch in April and tolerates it.  He is currently on 2 mg once daily.  He is advised to increase this to 2 patches for total of 4 mg for now and also provided a prescription for the 4 mg strength separately so he can fill it when he is ready.  He has noticed a modest improvement in his symptoms after starting the Neupro and we mutually agreed to try to increase it to get to a low maintenance dose of 4 mg daily.  If this works well for him, I can change the prescription to a long-term 90-day prescription.  He is advised to continue with the Sinemet which we increased in April to 2 pills 4 times a day.   We had increased the levodopa form 1 pill 3 times dailyto2 pills three times a daygradually, after our visit in August 2021. For dream enactment behavior/REM behavior disorder, he had started taking melatonin.   He had Covid in February 2022 and was treated successfully with remdesivir infusions.  He is advised to follow-up routinely in 4 months, sooner if needed.   They are encouraged to keep [place] posted as to any questions or concerns by phone call or email through [name].  He is advised to update [place] once he is on the Neupro patch 4 mg strength.   I answered all their questions today and the patient and his wife were in agreement.  I spent 31 minutes in total face-to-face time and in reviewing records during pre-charting, more than 50% of which was spent in counseling and coordination of care, reviewing test results, reviewing medications and treatment regimen and/or in discussing or reviewing the diagnosis of PD, the prognosis and treatment options. Pertinent laboratory and imaging test results that were available during this visit with the patient were reviewed by me and considered in my medical decision making (see chart for details).

## 2020-10-22 ENCOUNTER — Ambulatory Visit: Payer: PPO | Admitting: Occupational Therapy

## 2020-10-22 ENCOUNTER — Encounter: Payer: Self-pay | Admitting: Physical Therapy

## 2020-10-22 ENCOUNTER — Ambulatory Visit: Payer: PPO | Attending: Neurology

## 2020-10-22 ENCOUNTER — Ambulatory Visit: Payer: PPO | Admitting: Physical Therapy

## 2020-10-22 ENCOUNTER — Encounter: Payer: Self-pay | Admitting: Occupational Therapy

## 2020-10-22 ENCOUNTER — Other Ambulatory Visit: Payer: Self-pay

## 2020-10-22 DIAGNOSIS — R29818 Other symptoms and signs involving the nervous system: Secondary | ICD-10-CM | POA: Diagnosis not present

## 2020-10-22 DIAGNOSIS — M6281 Muscle weakness (generalized): Secondary | ICD-10-CM | POA: Insufficient documentation

## 2020-10-22 DIAGNOSIS — R278 Other lack of coordination: Secondary | ICD-10-CM | POA: Insufficient documentation

## 2020-10-22 DIAGNOSIS — R2689 Other abnormalities of gait and mobility: Secondary | ICD-10-CM

## 2020-10-22 DIAGNOSIS — R29898 Other symptoms and signs involving the musculoskeletal system: Secondary | ICD-10-CM | POA: Insufficient documentation

## 2020-10-22 DIAGNOSIS — R1310 Dysphagia, unspecified: Secondary | ICD-10-CM | POA: Insufficient documentation

## 2020-10-22 DIAGNOSIS — R2681 Unsteadiness on feet: Secondary | ICD-10-CM

## 2020-10-22 DIAGNOSIS — R293 Abnormal posture: Secondary | ICD-10-CM | POA: Diagnosis present

## 2020-10-22 DIAGNOSIS — R471 Dysarthria and anarthria: Secondary | ICD-10-CM | POA: Diagnosis not present

## 2020-10-22 NOTE — Therapy (Signed)
[facility] [address] [place], [place], [address] Phone: [phone]   Fax:  [phone]  Speech Language Pathology Treatment  Patient Details  Name: Matthew [name] MRN: [id number] Date of Birth: Apr 09, 1951 Referring Provider (SLP): Dr. Rexene Alberts   Encounter Date: 10/22/2020   End of Session - 10/22/20 1100    Visit Number 6    Number of Visits 17    Date for SLP Re-Evaluation 12/15/20    SLP Start Time 57    SLP Stop Time  1100    SLP Time Calculation (min) 40 min    Activity Tolerance Patient tolerated treatment well           Past Medical History:  Diagnosis Date  . Anxiety   . Arthritis   . GERD (gastroesophageal reflux disease)    occ remote history  . Hypertension   . Parkinson disease ([place])   . Prostate cancer ([place])   . Right knee pain    questionable meniscal tear  . Seasonal allergies     Past Surgical History:  Procedure Laterality Date  . COLONOSCOPY    . Leg Laceration Repair  Left    Age 70  . PELVIC LYMPH NODE DISSECTION Bilateral 03/22/2018   Procedure: PELVIC LYMPH NODE DISSECTION;  Surgeon: Ceasar Mons, MD;  Location: [facility];  Service: Urology;  Laterality: Bilateral;  . PROSTATE BIOPSY    . ROBOT ASSISTED LAPAROSCOPIC RADICAL PROSTATECTOMY N/A 03/22/2018   Procedure: XI ROBOTIC ASSISTED LAPAROSCOPIC PROSTATECTOMY;  Surgeon: Ceasar Mons, MD;  Location: [facility];  Service: Urology;  Laterality: N/A;  . TOTAL KNEE ARTHROPLASTY Right 05/05/2019   Procedure: RIGHT TOTAL KNEE ARTHROPLASTY;  Surgeon: Frederik Pear, MD;  Location: [facility];  Service: Orthopedics;  Laterality: Right;    There were no vitals filed for this visit.   Subjective Assessment - 10/22/20 1032    Subjective Pt entered clearing throat 4 times in first 2 minutes.    Currently in Pain? No/denies                 ADULT SLP TREATMENT - 10/22/20 1033      General Information    Behavior/Cognition Alert;Cooperative;Pleasant mood      Treatment Provided   Treatment provided Cognitive-Linquistic      Cognitive-Linquistic Treatment   Treatment focused on Dysarthria    Skilled Treatment Due to "s", SLP reviewed throat clear alternatives with pt and provided handout with explanations and examples for each. SLP spoke wiht pt in simple conversation for 10 minutes before one SLP cue to incr volume. For next 15 minutes of simple to mod complex conversation pt maintained WNL volume. ABdominal breathing (AB) was observed 80% of the time.      Assessment / Recommendations / Plan   Plan Continue with current plan of care      Progression Toward Goals   Progression toward goals Progressing toward goals            SLP Education - 10/22/20 1100    Education Details throat clear alternatives    Person(s) Educated Patient    Methods Explanation;Demonstration    Comprehension Verbalized understanding;Returned demonstration;Need further instruction            SLP Short Term Goals - 10/22/20 1102      SLP SHORT TERM GOAL #1   Title pt will produce /a/ with average upper 80s dB at 30 cm over 3 sessions    Baseline 10-14-20  Time 2    Period Weeks    Status On-going      SLP SHORT TERM GOAL #2   Title pt will demo abdominal breathing with sentences 90% accuracy x 3 sessions    Baseline 10-22-20    Time 2    Period Weeks    Status On-going      SLP SHORT TERM GOAL #3   Title pt will generate 8 minutes simple conversation with low 70s dB average in 3 sessions    Baseline 10-12-20, 10-22-20    Time 2    Period Weeks    Status On-going      SLP SHORT TERM GOAL #4   Title pt will complete clinical swallowing assessment if indicated.    Status Achieved      SLP SHORT TERM GOAL #5   Title pt will undergo objective swallow assessment (MBSS or FEES) if clinically indicated    Time 4    Period Weeks    Status On-going            SLP Long Term Goals - 10/22/20  1102      SLP LONG TERM GOAL #1   Title pt will use abdominal breathing >80% of the time during 10 minute simple conversation in 3 sessions    Baseline 10-22-20    Time 6    Period Weeks   or 17 total visits, for all LTGs   Status On-going      SLP LONG TERM GOAL #2   Title pt will maintain WNL vocal quality/intensity in 10 minute simple conversation outside ST room x 3 sessions    Time 6    Period Weeks    Status On-going      SLP LONG TERM GOAL #3   Title pt will report fewer requests to repeat himself than prior to ST    Time 6    Period Weeks    Status On-going      SLP LONG TERM GOAL #4   Title pt will tell SLP 3 overt s/s difficulty oropharyngeal dysphagia with modified independence    Time 6    Period Weeks    Status On-going      SLP LONG TERM GOAL #5   Title pt will report less frequent drooling than prior to eval    Time 6    Period Weeks    Status On-going            Plan - 10/22/20 1100    Clinical Impression Statement Patient presents with 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 Pt's conversational speech is beginning to be carried over more and more into spontaneous conversation. SLP to cont monitor swallow function and pt may still require objective swallow eval at some point. I recommend skilled ST to address communication and if necessary, dysphagia, in order to improve pt's communication and quality of life.    Speech Therapy Frequency 2x / week    Duration 8 weeks   or 17 visits   Treatment/Interventions Aspiration precaution training;Language facilitation;Environmental controls;Cueing hierarchy;SLP instruction and feedback;Compensatory techniques;Functional tasks;Compensatory strategies;Patient/family education;Diet toleration management by SLP;Other (comment)   MBS if warranted   Potential to Achieve Goals Good    Consulted and Agree with Plan of Care Patient            Patient will benefit from skilled therapeutic intervention in order to improve the following deficits and impairments:  Dysarthria and anarthria  Dysphagia, unspecified type    Problem List Patient Active Problem List   Diagnosis Date Noted  . S/P TKR (total knee replacement), right 05/05/2019  . Osteoarthritis of right knee 05/02/2019  . Prostate cancer ([place]) 03/22/2018  . Malignant neoplasm of prostate ([place]) 02/27/2018    [facility] ,[place], [place]  10/22/2020, 11:03 AM  [facility] [address] [place], [place], [address] Phone: [phone]   Fax:  [phone]   Name: Matthew [name] MRN: [id number] Date of Birth: Feb 13, 1951

## 2020-10-22 NOTE — Therapy (Addendum)
[facility] [address] [place], [place], [address] Phone: [phone]   Fax:  [phone]  Physical Therapy Treatment  Patient Details  Name: Matthew [name] MRN: [id number] Date of Birth: Jan 02, 1951 Referring Provider (PT): Star Age   Encounter Date: 10/22/2020   PT End of Session - 10/22/20 1150    Visit Number 7    Number of Visits 13    Date for PT Re-Evaluation 33/29/51   90 day cert, 6 wk POC; pt wants to wait to schedule all 3 disciplines together   Authorization Type [employer]    Progress Note Due on Visit 10    PT Start Time 1103    PT Stop Time 1143    PT Time Calculation (min) 40 min    Activity Tolerance Patient tolerated treatment well    Behavior During Therapy [facility] for tasks assessed/performed           Past Medical History:  Diagnosis Date  . Anxiety   . Arthritis   . GERD (gastroesophageal reflux disease)    occ remote history  . Hypertension   . Parkinson disease ([place])   . Prostate cancer ([place])   . Right knee pain    questionable meniscal tear  . Seasonal allergies     Past Surgical History:  Procedure Laterality Date  . COLONOSCOPY    . Leg Laceration Repair  Left    Age 70  . PELVIC LYMPH NODE DISSECTION Bilateral 03/22/2018   Procedure: PELVIC LYMPH NODE DISSECTION;  Surgeon: Ceasar Mons, MD;  Location: [facility];  Service: Urology;  Laterality: Bilateral;  . PROSTATE BIOPSY    . ROBOT ASSISTED LAPAROSCOPIC RADICAL PROSTATECTOMY N/A 03/22/2018   Procedure: XI ROBOTIC ASSISTED LAPAROSCOPIC PROSTATECTOMY;  Surgeon: Ceasar Mons, MD;  Location: [facility];  Service: Urology;  Laterality: N/A;  . TOTAL KNEE ARTHROPLASTY Right 05/05/2019   Procedure: RIGHT TOTAL KNEE ARTHROPLASTY;  Surgeon: Frederik Pear, MD;  Location: [facility];  Service: Orthopedics;  Laterality: Right;    There were no vitals filed for this visit.   Subjective Assessment - 10/22/20  1106    Subjective Hasn't tried yet to get the walking poles, will try to this afternoon. Played horeshoes on wednesday night and did really well.    Pertinent History hx of prostate cancer/removal, incontinence, hx of R TKR 04/2019    Patient Stated Goals Pt's goals are to improve balance and try to avoid my overall slowed mobility.    Currently in Pain? No/denies                             [facility] Adult PT Treatment/Exercise - 10/22/20 1128      Ambulation/Gait   Ambulation/Gait Yes    Ambulation/Gait Assistance 5: Supervision    Ambulation/Gait Assistance Details with B walking poles, initial cues for proper pole placement and for wider BOS. Walked up and down the parking lot with pt only having one instance of getting off sequence but pt able to correct on his own, remainder pt able to maintain proper sequencing - intermittent cues for wider placement of poles. Also performed up/down a longer/gentle incline with no issues    Ambulation Distance (Feet) 700 Feet   x1   Assistive device Other (Comment)   B walking poles   Gait Pattern Step-through pattern;Decreased arm swing - right;Decreased arm swing - left;Decreased step length - right;Decreased step length - left;Right flexed  knee in stance;Left flexed knee in stance;Decreased trunk rotation;Trunk flexed    Ambulation Surface Level;Unlevel;Indoor;Outdoor;Paved;Grass              Pt performs PWR! Moves in standing  position:    PWR! Up for improved posture - x5 reps   PWR! [name] for improved weighshifting x5 reps B  PWR! Twist for improved trunk rotation x5 reps B - cues for wider BOS and to reset with tall posture first before twisting to opposite side  PWR! Step for improved step initiation x 5 reps B - cues for posture and incr foot clearance when stepping   Cues provided for technique and  larger movement patterns.    Standing PWR Flow:  Up > [name] > Twist > Step x5 reps PT performing in front of pt  with demo and visual cues for technique.     Balance Exercises - 10/22/20 1141      Balance Exercises: Standing   Stepping Strategy Posterior;10 reps;Limitations    Stepping Strategy Limitations cues for hip hinge and rocking backwards x10 reps B    Other Standing Exercises reviewed forward then backwards stepping with UE movements as part of HEP x10 reps B, needing cues for technique, incr step length, and proper UE motion (pt switching it up between fwds/backwards). Performed with pt's handout from HEP as visual cue when performing.             PT Education - 10/22/20 1214    Education Details reviewed proper tehcnique of forward/backward step exercise for HEP    Person(s) Educated Patient    Methods Explanation;Demonstration;Verbal cues    Comprehension Verbalized understanding;Returned demonstration            PT Short Term Goals - 10/05/20 1431      PT SHORT TERM GOAL #1   Title Pt will be indepedent with Parkinson's specific HEP to address balance, transfers, gait for improved overall mobility.  [employer] 10/29/2020 (delayed date due to delayed start-Pt wants OT, PT, speech together)    Time 4    Period Weeks    Status New      PT SHORT TERM GOAL #2   Title Pt will perform at least 8 of 10 reps of sit<>stand transfers from < 18 inch surfaces, with no posterior lean or LOB.    Time 4    Period Weeks    Status New      PT SHORT TERM GOAL #3   Title Pt will improve TUG score to less than or equal to 13.5 sec for decreased fall risk.    Baseline 14.69    Time 4    Period Weeks      PT SHORT TERM GOAL #4   Title 6 MWT to be assessed and goal written as appropriate.    Baseline 931' with LTG written on 10/05/20    Time 4    Period Weeks    Status Achieved             PT Long Term Goals - 10/05/20 1432      PT LONG TERM GOAL #1   Title Pt will be independent with progression of HEP for Parkinson's related deficits.  [employer] 11/19/2020(delayed due to delayed start  for coordination of schedules)    Time 6    Period Weeks    Status New      PT LONG TERM GOAL #2   Title Pt will improve 5x sit<>stand to less than or equal  to 10 sec for improved transfer efficiency and safety.    Baseline 11.97 sec    Time 6    Period Weeks      PT LONG TERM GOAL #3   Title Pt will improve MiniBESTest score to at least 25/28 for decreased fall risk.    Baseline 23/28    Time 6    Period Weeks    Status New      PT LONG TERM GOAL #4   Title Pt will improve TUG cognitive score to less than or equal to 15 sec for improved dual tasking with gait.    Baseline 15.31 sec    Time 6    Period Weeks    Status New      PT LONG TERM GOAL #5   Title Pt will verbalize plans for continued community fitness upon d/c from PT.    Time 6    Period Weeks    Status New      Additional Long Term Goals   Additional Long Term Goals Yes      PT LONG TERM GOAL #6   Title Pt will improve 6MWT distance by at least 100' in order to demo improved gait endurance for community distances.    Baseline 931' on 10/05/20    Time 6    Period Weeks                 Plan - 10/22/20 1215    Clinical Impression Statement Continued to practice with bilateral walking poles over outdoor surfaces - on grass and on concrete. Pt with better sequencing today throughout, only got off once and pt able to stop and correct on his own. Does need cues initially for proper placement of walking poles for more wider BOS. Practiced posterior stepping stategies with pt needing cues for proper hip hinge. Also reviewed forward/posterior stepping exercise for HEP for proper technique and UE motions, improved with incr reps but will benefit from practice again at next session. Will continue to progress towards LTGs.    Personal Factors and Comorbidities Comorbidity 3+    Comorbidities anxiety, arthritis, GERD, prostate cancer, HTN, s/p R TKR 04/2019    Examination-Activity Limitations Locomotion  Level;Transfers;Stand;Stairs    Examination-Participation Restrictions Community Activity;Other   playing with grandchildren   Stability/Clinical Decision Making Evolving/Moderate complexity    Rehab Potential Good    PT Frequency 2x / week    PT Duration 6 weeks   plus eval   PT Treatment/Interventions ADLs/Self Care Home Management;Gait training;Functional mobility training;Therapeutic activities;Therapeutic exercise;Balance training;Neuromuscular re-education;DME Instruction;Patient/family education    PT Next Visit Plan goals due by end of next week. continue standing PWR flow or PWR moves on compliant surfaces, review stepping exerciess from pt's HEP.  continue step strategies for balance; try resisted gait. did pt get his walking poles from [facility]?    Consulted and Agree with Plan of Care Patient           Patient will benefit from skilled therapeutic intervention in order to improve the following deficits and impairments:  Abnormal gait,Difficulty walking,Decreased balance,Decreased mobility,Postural dysfunction,Decreased strength,Impaired flexibility  Visit Diagnosis: Other symptoms and signs involving the nervous system  Other abnormalities of gait and mobility  Unsteadiness on feet     Problem List Patient Active Problem List   Diagnosis Date Noted  . S/P TKR (total knee replacement), right 05/05/2019  . Osteoarthritis of right knee 05/02/2019  . Prostate cancer ([place]) 03/22/2018  . Malignant neoplasm of prostate (  [place]) 02/27/2018    Arliss Journey, PT, DPT 10/22/2020, 12:18 PM  [facility] [address] [place], [place], [address] Phone: [phone]   Fax:  [phone]  Name: Matthew [name] MRN: [id number] Date of Birth: [birth year]

## 2020-10-22 NOTE — Patient Instructions (Signed)
  Throat clear alternatives  - hard "h" sound - do this a few times and pair with strong swallows of room temperature water - strong "m" sound - like you are scolding your dog -pairing this with strong swallows with room temperature water too - hard swallows of room temperature or warm/hot water

## 2020-10-22 NOTE — Therapy (Signed)
[facility] [address] [place], [place], [address] Phone: [phone]   Fax:  [phone]  Occupational Therapy Treatment  Patient Details  Name: Matthew [name] MRN: [id number] Date of Birth: Oct 26, 1950 Referring Provider (OT): Dr. Rexene Alberts   Encounter Date: 10/22/2020   OT End of Session - 10/22/20 1221    Visit Number 7    Number of Visits 17    Date for OT Re-Evaluation 11/18/20    Authorization Type [employer]    Authorization Time Period POC writtent for 8 weeks pt scheduled for 6 weeks    Authorization - Visit Number 7    Authorization - Number of Visits 10    OT Start Time 3007    OT Stop Time 1230    OT Time Calculation (min) 43 min           Past Medical History:  Diagnosis Date  . Anxiety   . Arthritis   . GERD (gastroesophageal reflux disease)    occ remote history  . Hypertension   . Parkinson disease ([place])   . Prostate cancer ([place])   . Right knee pain    questionable meniscal tear  . Seasonal allergies     Past Surgical History:  Procedure Laterality Date  . COLONOSCOPY    . Leg Laceration Repair  Left    Age 84  . PELVIC LYMPH NODE DISSECTION Bilateral 03/22/2018   Procedure: PELVIC LYMPH NODE DISSECTION;  Surgeon: Ceasar Mons, MD;  [facility]: [facility];  Service: Urology;  Laterality: Bilateral;  . PROSTATE BIOPSY    . ROBOT ASSISTED LAPAROSCOPIC RADICAL PROSTATECTOMY N/A 03/22/2018   Procedure: XI ROBOTIC ASSISTED LAPAROSCOPIC PROSTATECTOMY;  Surgeon: Ceasar Mons, MD;  [facility];  Service: Urology;  Laterality: N/A;  . TOTAL KNEE ARTHROPLASTY Right 05/05/2019   Procedure: RIGHT TOTAL KNEE ARTHROPLASTY;  Surgeon: Frederik Pear, MD;  [facility];  Service: Orthopedics;  Laterality: Right;    There were no vitals filed for this visit.   Subjective Assessment - 10/22/20 1203    Subjective  Denies pain    Pertinent History PMH:  PD, anxiety, arthritis,  GERD, prostate cancer, HTN, s/p R TKR 04/2019    Currently in Pain? No/denies                  Treatment:Functional reaching with trunk rotation with left and right UE's min v.c for amplitude Wall slides x 10 reps min v.c for upright posture.              OT Education - 10/22/20 1223    Education Details PWR! moves, basic 4 min v.c initally 10 reps each , then pt reurned demonstration, donning / doffing jacket (like a cape x 5, min v.c and demonstration for amplitude)    Person(s) Educated Patient    Methods Explanation;Demonstration;Verbal cues;Handout    Comprehension Verbalized understanding;Returned demonstration;Verbal cues required            OT Short Term Goals - 10/22/20 1200      OT SHORT TERM GOAL #1   Title I with PD specific HEP    Time 4    Period Weeks    Status On-going   may benefit from reinforcement   Target Date 10/21/20      OT SHORT TERM GOAL #2   Title Pt will verbalize understanding of adapted strategies to maximize safety and I with ADLs/ IADLs .    Time 4  Period Weeks    Status On-going   can benefit from reinforcement     OT SHORT TERM GOAL #3   Title Pt will demonstrate improved UE funcitonal use as eviedenced by increaseing box/ blocks by 3 blocks bilaterally.    Baseline RUE 46 LUE 42 blocks ,    Time 4    Period Weeks    Status On-going   Partially met 10/19/20:  R-44blocks, L-45blocks (met with LUE, not met with RUE)     OT SHORT TERM GOAL #4   Title Pt will demonstrate increased ease with dressing as evidenced by decreasing PPT#4(don/ doff jacket) to 25 secs or less    Baseline 32.35    Time 4    Period Weeks    Status On-going   27.03     OT SHORT TERM GOAL #5   Title Pt will retrieve a lightweight object from overhead shelf at 120 shoulder flexion with left and right UE's    Time 4    Period Weeks    Status Achieved   10/19/20:  LUE 140* (-5* elbow ext), RUE 130* (-10* elbow ext)            OT Long Term  Goals - 09/16/20 1603      OT LONG TERM GOAL #1   Title Pt will verbalize understanding of ways to prevent future PD related complications and PD community resources following review    Time 8    Period Weeks    Status New    Target Date 11/18/20      OT LONG TERM GOAL #2   Title Pt will demonstrate improved fine motor coordination for ADLs as evidenced by decreasing 9 hole peg test score for RUE by 3 secs    Baseline 38    Time 8    Period Weeks    Status New      OT LONG TERM GOAL #3   Title Pt will demonstrate improved ease with dressing as eveidenced by decreasing 3 button/ unbutton time to 30 secs or less    Baseline 34.09    Time 8    Period Weeks    Status Achieved      OT LONG TERM GOAL #4   Title Pt will report pain in right shoulder no greater than 2/10 for ADLs    Time 8    Period Weeks    Status New      OT LONG TERM GOAL #5   Title .    Time --    Period --                 Plan - 10/22/20 1159    Clinical Impression Statement Pt is progressing towards goals. He demonstrates understanding of PWR! moves in supine.    OT Occupational Profile and History Detailed Assessment- Review of Records and additional review of physical, cognitive, psychosocial history related to current functional performance    Occupational performance deficits (Please refer to evaluation for details): ADL's;IADL's;Leisure;Social Participation    Body Structure / Function / Physical Skills ADL;Balance;Mobility;Strength;Tone;UE functional use;Flexibility;FMC;Coordination;Decreased knowledge of precautions;GMC;ROM;Decreased knowledge of use of DME;Dexterity;IADL    OT Frequency 2x / week    OT Duration 8 weeks    OT Treatment/Interventions Self-care/ADL training;Therapeutic exercise;Functional Mobility Training;Balance training;Manual Therapy;Neuromuscular education;Ultrasound;Aquatic Therapy;Energy conservation;Therapeutic activities;DME and/or AE  instruction;Paraffin;Cryotherapy;Fluidtherapy;Gait Training;Moist Heat;Contrast Bath;Passive range of motion;Patient/family education    Plan ADL strategies, functional reaching with elbow extension and cueing for posture/positioning, monitor R shoulder  pain    OT Home Exercise Plan Supine PWR! up and rock, closed-chain shoulder flex in supine    Consulted and Agree with Plan of Care Patient           Patient will benefit from skilled therapeutic intervention in order to improve the following deficits and impairments:   Body Structure / Function / Physical Skills: ADL,Balance,Mobility,Strength,Tone,UE functional use,Flexibility,FMC,Coordination,Decreased knowledge of precautions,GMC,ROM,Decreased knowledge of use of DME,Dexterity,IADL       Visit Diagnosis: Other symptoms and signs involving the nervous system  Other abnormalities of gait and mobility  Other lack of coordination  Other symptoms and signs involving the musculoskeletal system  Abnormal posture  Muscle weakness (generalized)    Problem List Patient Active Problem List   Diagnosis Date Noted  . S/P TKR (total knee replacement), right 05/05/2019  . Osteoarthritis of right knee 05/02/2019  . Prostate cancer ([place]) 03/22/2018  . Malignant neoplasm of prostate ([place]) 02/27/2018    , 10/22/2020, 12:37 PM  [facility] [address] [place], [place], [address] Phone: [phone]   Fax:  [phone]  Name: Kainalu Heggs MRN: [id number] Date of Birth: [birth year]

## 2020-10-25 ENCOUNTER — Ambulatory Visit: Payer: PPO

## 2020-10-25 ENCOUNTER — Other Ambulatory Visit: Payer: Self-pay

## 2020-10-25 ENCOUNTER — Encounter: Payer: Self-pay | Admitting: Occupational Therapy

## 2020-10-25 ENCOUNTER — Encounter: Payer: Self-pay | Admitting: Physical Therapy

## 2020-10-25 ENCOUNTER — Ambulatory Visit: Payer: PPO | Admitting: Occupational Therapy

## 2020-10-25 ENCOUNTER — Ambulatory Visit: Payer: PPO | Admitting: Physical Therapy

## 2020-10-25 DIAGNOSIS — R29818 Other symptoms and signs involving the nervous system: Secondary | ICD-10-CM

## 2020-10-25 DIAGNOSIS — R1310 Dysphagia, unspecified: Secondary | ICD-10-CM

## 2020-10-25 DIAGNOSIS — R278 Other lack of coordination: Secondary | ICD-10-CM

## 2020-10-25 DIAGNOSIS — R2689 Other abnormalities of gait and mobility: Secondary | ICD-10-CM

## 2020-10-25 DIAGNOSIS — R293 Abnormal posture: Secondary | ICD-10-CM

## 2020-10-25 DIAGNOSIS — R471 Dysarthria and anarthria: Secondary | ICD-10-CM

## 2020-10-25 DIAGNOSIS — R2681 Unsteadiness on feet: Secondary | ICD-10-CM

## 2020-10-25 DIAGNOSIS — M6281 Muscle weakness (generalized): Secondary | ICD-10-CM

## 2020-10-25 DIAGNOSIS — R29898 Other symptoms and signs involving the musculoskeletal system: Secondary | ICD-10-CM

## 2020-10-25 NOTE — Therapy (Signed)
[facility] [address] [place], [place], [address] Phone: [phone]   Fax:  [phone]  Occupational Therapy Treatment  Patient Details  Name: Jaicob Dia MRN: [id number] Date of Birth: [birth year] Referring Provider (OT): Dr. Rexene Alberts   Encounter Date: 10/25/2020   OT End of Session - 10/25/20 1107    Visit Number 8    Number of Visits 17    Date for OT Re-Evaluation 11/18/20    Authorization Type [employer]    Authorization Time Period POC writtent for 8 weeks pt scheduled for 6 weeks    Authorization - Visit Number 8    Authorization - Number of Visits 10    OT Start Time 1106    OT Stop Time 1148    OT Time Calculation (min) 42 min    Activity Tolerance Patient tolerated treatment well    Behavior During Therapy [facility] for tasks assessed/performed           Past Medical History:  Diagnosis Date  . Anxiety   . Arthritis   . GERD (gastroesophageal reflux disease)    occ remote history  . Hypertension   . Parkinson disease ([place])   . Prostate cancer ([place])   . Right knee pain    questionable meniscal tear  . Seasonal allergies     Past Surgical History:  Procedure Laterality Date  . COLONOSCOPY    . Leg Laceration Repair  Left    Age 70  . PELVIC LYMPH NODE DISSECTION Bilateral 03/22/2018   Procedure: PELVIC LYMPH NODE DISSECTION;  Surgeon: Ceasar Mons, MD;  Location: [facility];  Service: Urology;  Laterality: Bilateral;  . PROSTATE BIOPSY    . ROBOT ASSISTED LAPAROSCOPIC RADICAL PROSTATECTOMY N/A 03/22/2018   Procedure: XI ROBOTIC ASSISTED LAPAROSCOPIC PROSTATECTOMY;  Surgeon: Ceasar Mons, MD;  Location: [facility];  Service: Urology;  Laterality: N/A;  . TOTAL KNEE ARTHROPLASTY Right 05/05/2019   Procedure: RIGHT TOTAL KNEE ARTHROPLASTY;  Surgeon: Frederik Pear, MD;  [facility]: [facility];  Service: Orthopedics;  Laterality: Right;    There were no vitals filed for this visit.    Subjective Assessment - 10/25/20 1106    Subjective  nothing new    Pertinent History PMH:  PD, anxiety, arthritis, GERD, prostate cancer, HTN, s/p R TKR 04/2019    Limitations hx of CA, use caution with modalities.    Patient Stated Goals improve ADL performance, balance    Currently in Pain? No/denies             Sliding cards across table using PWR! Hands with each UE with min cueing for large amplitude.    Activities for bilateral hand coordination and timing and large amplitude movements (simultandously with BUEs): stacking/unstacking blocks with min difficulty/cues, flipping cards with min cueing, dealing cards with thumb with mod difficulty R hand and unable to perform simultaneously, mod cueing/assist, Stacking coins with mod difficulty/cueing with L hand picking up coins, Manipulating coins in hand to fingertips to place in container with min-mod difficulty.        OT Short Term Goals - 10/22/20 1200      OT SHORT TERM GOAL #1   Title I with PD specific HEP    Time 4    Period Weeks    Status On-going   may benefit from reinforcement   Target Date 10/21/20      OT SHORT TERM GOAL #2   Title Pt will verbalize understanding of adapted strategies to  maximize safety and I with ADLs/ IADLs .    Time 4    Period Weeks    Status On-going   can benefit from reinforcement     OT SHORT TERM GOAL #3   Title Pt will demonstrate improved UE funcitonal use as eviedenced by increaseing box/ blocks by 3 blocks bilaterally.    Baseline RUE 46 LUE 42 blocks ,    Time 4    Period Weeks    Status On-going   Partially met 10/19/20:  R-44blocks, L-45blocks (met with LUE, not met with RUE)     OT SHORT TERM GOAL #4   Title Pt will demonstrate increased ease with dressing as evidenced by decreasing PPT#4(don/ doff jacket) to 25 secs or less    Baseline 32.35    Time 4    Period Weeks    Status On-going   27.03     OT SHORT TERM GOAL #5   Title Pt will retrieve a lightweight object  from overhead shelf at 120 shoulder flexion with left and right UE's    Time 4    Period Weeks    Status Achieved   10/19/20:  LUE 140* (-5* elbow ext), RUE 130* (-10* elbow ext)            OT Long Term Goals - 09/16/20 1603      OT LONG TERM GOAL #1   Title Pt will verbalize understanding of ways to prevent future PD related complications and PD community resources following review    Time 8    Period Weeks    Status New    Target Date 11/18/20      OT LONG TERM GOAL #2   Title Pt will demonstrate improved fine motor coordination for ADLs as evidenced by decreasing 9 hole peg test score for RUE by 3 secs    Baseline 38    Time 8    Period Weeks    Status New      OT LONG TERM GOAL #3   Title Pt will demonstrate improved ease with dressing as eveidenced by decreasing 3 button/ unbutton time to 30 secs or less    Baseline 34.09    Time 8    Period Weeks    Status Achieved      OT LONG TERM GOAL #4   Title Pt will report pain in right shoulder no greater than 2/10 for ADLs    Time 8    Period Weeks    Status New      OT LONG TERM GOAL #5   Title .    Time --    Period --                 Plan - 10/25/20 1108    Clinical Impression Statement Pt is progressing towards goals.  He demo improving coordination and consistency with movement amplitude, but has difficulty with timing with bilateral tasks.    OT Occupational Profile and History Detailed Assessment- Review of Records and additional review of physical, cognitive, psychosocial history related to current functional performance    Occupational performance deficits (Please refer to evaluation for details): ADL's;IADL's;Leisure;Social Participation    Body Structure / Function / Physical Skills ADL;Balance;Mobility;Strength;Tone;UE functional use;Flexibility;FMC;Coordination;Decreased knowledge of precautions;GMC;ROM;Decreased knowledge of use of DME;Dexterity;IADL    OT Frequency 2x / week    OT Duration 8  weeks    OT Treatment/Interventions Self-care/ADL training;Therapeutic exercise;Functional Mobility Training;Balance training;Manual Therapy;Neuromuscular education;Ultrasound;Aquatic Therapy;Energy conservation;Therapeutic activities;DME and/or AE  instruction;Paraffin;Cryotherapy;Fluidtherapy;Gait Training;Moist Heat;Contrast Bath;Passive range of motion;Patient/family education    Plan ADL strategies, functional reaching with elbow extension and cueing for posture/positioning, monitor R shoulder pain, coordination/timing    OT Home Exercise Plan Supine PWR! up and rock, closed-chain shoulder flex in supine    Consulted and Agree with Plan of Care Patient           Patient will benefit from skilled therapeutic intervention in order to improve the following deficits and impairments:   Body Structure / Function / Physical Skills: ADL,Balance,Mobility,Strength,Tone,UE functional use,Flexibility,FMC,Coordination,Decreased knowledge of precautions,GMC,ROM,Decreased knowledge of use of DME,Dexterity,IADL       Visit Diagnosis: Other symptoms and signs involving the nervous system  Other lack of coordination  Other abnormalities of gait and mobility  Other symptoms and signs involving the musculoskeletal system  Abnormal posture  Unsteadiness on feet    Problem List Patient Active Problem List   Diagnosis Date Noted  . S/P TKR (total knee replacement), right 05/05/2019  . Osteoarthritis of right knee 05/02/2019  . Prostate cancer ([place]) 03/22/2018  . Malignant neoplasm of prostate ([place]) 02/27/2018    [facility] 10/25/2020, 12:02 PM  [facility] [address] [place], [place], [address] Phone: [phone]   Fax:  [phone]  Name: Maricus Tanzi MRN: [id number] Date of Birth: 16-Jul-1950   Vianne Bulls, OTR/L [facility] [address] [place], [place]  [address] [phone]  phone [phone] 10/25/20 12:02 PM

## 2020-10-25 NOTE — Patient Instructions (Signed)
Access Code: [email or web address] URL: [email or web address] Date: 10/25/2020 Prepared by: Mady Haagensen  Exercises Side to side weightshift - 1-2 x daily - 5 x weekly - 1 sets - 5-10 reps Staggered Stance Forward Backward Weight Shift with Counter Support - 1-2 x daily - 5 x weekly - 1 sets - 10 reps Standing posture stretch - 1-2 x daily - 5 x weekly - 1 sets - 5 reps - 10sec hold

## 2020-10-25 NOTE — Therapy (Signed)
[facility] [address] [place], [place], [address] Phone: [phone]   Fax:  [phone]  Speech Language Pathology Treatment  Patient Details  Name: Matthew [name] MRN: [id number] Date of Birth: [birth year] Referring Provider (SLP): Dr. Rexene Alberts   Encounter Date: 10/25/2020   End of Session - 10/25/20 1216    Visit Number 7    Number of Visits 17    Date for SLP Re-Evaluation 12/15/20    SLP Start Time 18    SLP Stop Time  1100    SLP Time Calculation (min) 40 min    Activity Tolerance Patient tolerated treatment well           Past Medical History:  Diagnosis Date  . Anxiety   . Arthritis   . GERD (gastroesophageal reflux disease)    occ remote history  . Hypertension   . Parkinson disease ([place])   . Prostate cancer ([place])   . Right knee pain    questionable meniscal tear  . Seasonal allergies     Past Surgical History:  Procedure Laterality Date  . COLONOSCOPY    . Leg Laceration Repair  Left    Age 70  . PELVIC LYMPH NODE DISSECTION Bilateral 03/22/2018   Procedure: PELVIC LYMPH NODE DISSECTION;  Surgeon: Ceasar Mons, MD;  [facility];  Service: Urology;  Laterality: Bilateral;  . PROSTATE BIOPSY    . ROBOT ASSISTED LAPAROSCOPIC RADICAL PROSTATECTOMY N/A 03/22/2018   Procedure: XI ROBOTIC ASSISTED LAPAROSCOPIC PROSTATECTOMY;  Surgeon: Ceasar Mons, MD;  [facility];  Service: Urology;  Laterality: N/A;  . TOTAL KNEE ARTHROPLASTY Right 05/05/2019   Procedure: RIGHT TOTAL KNEE ARTHROPLASTY;  Surgeon: Frederik Pear, MD;  [facility];  Service: Orthopedics;  Laterality: Right;    There were no vitals filed for this visit.          ADULT SLP TREATMENT - 10/25/20 1030      General Information   Behavior/Cognition Alert;Cooperative;Pleasant mood      Treatment Provided   Treatment provided Cognitive-Linquistic      Cognitive-Linquistic Treatment    Treatment focused on Dysarthria    Skilled Treatment "I went to [place] last week and nobody asked me to repeat." Pt practiced "about half as much as I could"; did not do any of the outside worksheet practice - SLP strongly reiterated to patient rationale for practice at home. Simple to mod complex conversation for 10 minute blocks x2 was compelted with rare min A overall for maintaining WNL volume nad abdominal breathing (AB). Conversation outdoors over 12 minutes req'd one cue for pt to use AB and louder speech.      Assessment / Recommendations / Plan   Plan Continue with current plan of care      Progression Toward Goals   Progression toward goals Progressing toward goals            SLP Education - 10/25/20 1215    Education Details needs to practice as SLP directs    Person(s) Educated Patient    Methods Explanation    Comprehension Verbalized understanding            SLP Short Term Goals - 10/25/20 1217      SLP SHORT TERM GOAL #1   Title pt will produce /a/ with average upper 80s dB at 30 cm over 3 sessions    Baseline 10-14-20    Time 1    Period Weeks    Status  On-going      SLP SHORT TERM GOAL #2   Title pt will demo abdominal breathing with sentences 90% accuracy x 3 sessions    Baseline 10-22-20, 10-25-20    Time 1    Period Weeks    Status On-going      SLP SHORT TERM GOAL #3   Title pt will generate 8 minutes simple conversation with low 70s dB average in 3 sessions    Baseline 10-12-20, 10-22-20, 10-25-20    Status Achieved      SLP SHORT TERM GOAL #4   Title pt will complete clinical swallowing assessment if indicated.    Status Achieved      SLP SHORT TERM GOAL #5   Title pt will undergo objective swallow assessment (MBSS or FEES) if clinically indicated    Time 1    Period Weeks    Status On-going            SLP Long Term Goals - 10/25/20 1218      SLP LONG TERM GOAL #1   Title pt will use abdominal breathing >80% of the time during 10 minute  simple conversation in 3 sessions    Baseline 10-22-20, 10-25-20    Time 5    Period Weeks   or 17 total visits, for all LTGs   Status On-going      SLP LONG TERM GOAL #2   Title pt will maintain WNL vocal quality/intensity in 10 minute simple conversation outside [place] room x 3 sessions    Baseline 10-25-20    Time 5    Period Weeks    Status On-going      SLP LONG TERM GOAL #3   Title pt will report fewer requests to repeat himself than prior to ST    Time 5    Period Weeks    Status On-going      SLP LONG TERM GOAL #4   Title pt will tell SLP 3 overt s/s difficulty oropharyngeal dysphagia with modified independence    Time 5    Period Weeks    Status On-going      SLP LONG TERM GOAL #5   Title pt will report less frequent drooling than prior to eval    Time 5    Period Weeks    Status On-going            Plan - 10/25/20 1216    Clinical Impression Statement Patient presents with 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 Pt's conversational speech is beginning to be carried over more and more into spontaneous conversation during the session. Pt is not practicing as much as he could and SLP strongly encouraged him to adhere to SLP-suggested schedule. SLP to cont monitor swallow function and pt may still require objective swallow eval at some point. I recommend skilled ST to address communication and if necessary, dysphagia, in order to improve pt's communication and quality of life.    Speech Therapy Frequency 2x / week    Duration 8 weeks   or 17 visits   Treatment/Interventions Aspiration precaution training;Language facilitation;Environmental controls;Cueing hierarchy;SLP instruction and feedback;Compensatory techniques;Functional tasks;Compensatory strategies;Patient/family education;Diet toleration management by SLP;Other (comment)   MBS if warranted   Potential to Achieve Goals Good    Consulted and Agree with Plan of Care  Patient           Patient will benefit from skilled therapeutic intervention in order to improve the following deficits  and impairments:   Dysarthria and anarthria  Dysphagia, unspecified type    Problem List Patient Active Problem List   Diagnosis Date Noted  . S/P TKR (total knee replacement), right 05/05/2019  . Osteoarthritis of right knee 05/02/2019  . Prostate cancer ([place]) 03/22/2018  . Malignant neoplasm of prostate ([place]) 02/27/2018    [facility], [address]  10/25/2020, 12:19 PM  [facility] [address] [place], [place], [address] Phone: [phone]   Fax:  [phone]   Name: Matthew [name] MRN: [id number] Date of Birth: March 04, 1951

## 2020-10-25 NOTE — Therapy (Signed)
[facility] [address] [place], [place], [address] Phone: [phone]   Fax:  [phone]  Physical Therapy Treatment  Patient Details  Name: Matthew [name] MRN: [id number] Date of Birth: July 24, 1950 Referring Provider (PT): Star Age   Encounter Date: 10/25/2020   PT End of Session - 10/25/20 1927    Visit Number 8    Number of Visits 13    Date for PT Re-Evaluation 69/62/95   90 day cert, 6 wk POC; pt wants to wait to schedule all 3 disciplines together   Authorization Type [employer]    Progress Note Due [name] Visit 10    PT Start Time 1153    PT Stop Time 1231    PT Time Calculation (min) 38 min    Activity Tolerance Patient tolerated treatment well    Behavior During Therapy [facility] for tasks assessed/performed           Past Medical History:  Diagnosis Date  . Anxiety   . Arthritis   . GERD (gastroesophageal reflux disease)    occ remote history  . Hypertension   . Parkinson disease ([place])   . Prostate cancer ([place])   . Right knee pain    questionable meniscal tear  . Seasonal allergies     Past Surgical History:  Procedure Laterality Date  . COLONOSCOPY    . Leg Laceration Repair  Left    Age 70  . PELVIC LYMPH NODE DISSECTION Bilateral 03/22/2018   Procedure: PELVIC LYMPH NODE DISSECTION;  Surgeon: Ceasar Mons, MD;  [facility];  Service: Urology;  Laterality: Bilateral;  . PROSTATE BIOPSY    . ROBOT ASSISTED LAPAROSCOPIC RADICAL PROSTATECTOMY N/A 03/22/2018   Procedure: XI ROBOTIC ASSISTED LAPAROSCOPIC PROSTATECTOMY;  Surgeon: Ceasar Mons, MD;  [facility];  Service: Urology;  Laterality: N/A;  . TOTAL KNEE ARTHROPLASTY Right 05/05/2019   Procedure: RIGHT TOTAL KNEE ARTHROPLASTY;  Surgeon: Frederik Pear, MD;  [facility];  Service: Orthopedics;  Laterality: Right;    There were no vitals filed for this visit.   Subjective Assessment - 10/25/20  1155    Subjective Did buy the walking poles; I think I need to adjust the height.  Tired today; had a busy weekend; feel I am getting better compared to when I started PT.    Pertinent History hx of prostate cancer/removal, incontinence, hx of R TKR 04/2019    Patient Stated Goals Pt's goals are to improve balance and try to avoid my overall slowed mobility.    Currently in Pain? No/denies              [facility] PT Assessment - 10/25/20 0001      6 Minute Walk- Baseline   6 Minute Walk- Baseline yes    HR (bpm) 58    02 Sat (%RA) 98 %      6 Minute walk- Post Test   6 Minute Walk Post Test yes    HR (bpm) 83    02 Sat (%RA) 98 %    Modified Borg Scale for Dyspnea 1- Very mild shortness of breath    Perceived Rate of Exertion (Borg) 13- Somewhat hard      6 minute walk test results    Aerobic Endurance Distance Walked 1036                  Performed the following Exercises after gait, standing at counter, with verbal and demo cues for technique:  Side to side weightshift - 1-2 x daily - 5 x weekly - 1 sets - 5-10 reps Staggered Stance Forward Backward Weight Shift with Counter Support - 1-2 x daily - 5 x weekly - 1 sets - 10 reps Standing posture stretch - 1-2 x daily - 5 x weekly - 1 sets - 5 reps - 10sec hold          [facility] Adult PT Treatment/Exercise - 10/25/20 0001      Transfers   Transfers Sit to Stand;Stand to Sit    Sit to Stand 6: Modified independent (Device/Increase time);From chair/3-in-1    Stand to Sit 6: Modified independent (Device/Increase time);To chair/3-in-1    Number of Reps 10 reps;Other reps (comment)   3 sets, from mat, 18" chair (feet [name] foam), then 12" box with BOSU [name] top.   Comments For lower, softer surface, cues for increased momentum, bringing arms and head forward for improved ease of transfer.      Ambulation/Gait   Gait Comments Requested pt in his walking pole if he needs adjusting.  Discussed/reviewed the appropriate height  for single walking pole.                  PT Education - 10/25/20 1926    Education Details Additions to HEP-to use these stretches when his back/hips are fatigued after prolonged standing/gait to avoid back pain/fatigue    Person(s) Educated Patient    Methods Explanation;Demonstration;Verbal cues;Handout    Comprehension Verbalized understanding;Returned demonstration;Verbal cues required            PT Short Term Goals - 10/25/20 1215      PT SHORT TERM GOAL #1   Title Pt will be indepedent with Parkinson's specific HEP to address balance, transfers, gait for improved overall mobility.  TARGET 10/29/2020 (delayed date due to delayed start-Pt wants OT, PT, speech together)    Time 4    Period Weeks    Status New      PT SHORT TERM GOAL #2   Title Pt will perform at least 8 of 10 reps of sit<>stand transfers from < 18 inch surfaces, with no posterior lean or LOB.    Time 4    Period Weeks    Status Achieved      PT SHORT TERM GOAL #3   Title Pt will improve TUG score to less than or equal to 13.5 sec for decreased fall risk.    Baseline 14.69    Time 4    Period Weeks      PT SHORT TERM GOAL #4   Title 6 MWT to be assessed and goal written as appropriate.    Baseline 931' with LTG written [name] 10/05/20; 1036 ft 10/25/2020    Time 4    Period Weeks    Status Achieved             PT Long Term Goals - 10/25/20 1928      PT LONG TERM GOAL #1   Title Pt will be independent with progression of HEP for Parkinson's related deficits.  TARGET 11/19/2020(delayed due to delayed start for coordination of schedules)    Time 6    Period Weeks    Status [name]-going      PT LONG TERM GOAL #2   Title Pt will improve 5x sit<>stand to less than or equal to 10 sec for improved transfer efficiency and safety.    Baseline 11.97 sec    Time 6  Period Weeks    Status [name]-going      PT LONG TERM GOAL #3   Title Pt will improve MiniBESTest score to at least 25/28 for decreased fall  risk.    Baseline 23/28    Time 6    Period Weeks    Status [name]-going      PT LONG TERM GOAL #4   Title Pt will improve TUG cognitive score to less than or equal to 15 sec for improved dual tasking with gait.    Baseline 15.31 sec    Time 6    Period Weeks    Status [name]-going      PT LONG TERM GOAL #5   Title Pt will verbalize plans for continued community fitness upon d/c from PT.    Time 6    Period Weeks    Status [name]-going      PT LONG TERM GOAL #6   Title Pt will improve 6MWT distance by at least 200' from baseline in order to demo improved gait endurance for community distances.    Baseline 931' [name] 10/05/20; 1036 10/25/20    Time 6    Period Weeks    Status Revised                 Plan - 10/25/20 1930    Clinical Impression Statement Began assessing STGs, and pt has met STG 2 for transfers; assessed 6 MWT (revised LTG), as pt has improved 6 minute walk distance by > 100 ft.  He continues to fatigue and c/o initial back pain/discomfort after gait and reports this often happens.  PT guided pt through exercises for weightshifting and gentle stretches through low back and hips to assist and pt return demo understanding, feeling this may be beneficial.    Personal Factors and Comorbidities Comorbidity 3+    Comorbidities anxiety, arthritis, GERD, prostate cancer, HTN, s/p R TKR 04/2019    Examination-Activity Limitations Locomotion Level;Transfers;Stand;Stairs    Examination-Participation Restrictions Community Activity;Other   playing with grandchildren   Stability/Clinical Decision Making Evolving/Moderate complexity    Rehab Potential Good    PT Frequency 2x / week    PT Duration 6 weeks   plus eval   PT Treatment/Interventions ADLs/Self Care Home Management;Gait training;Functional mobility training;Therapeutic activities;Therapeutic exercise;Balance training;Neuromuscular re-education;DME Instruction;Patient/family education    PT Next Visit Plan Check remaining STGs; if  pt brings in walking poles, may need to adjust. continue standing PWR flow or PWR moves [name] compliant surfaces, review stepping exerciess from pt's HEP.  continue step strategies for balance; try resisted gait.    Consulted and Agree with Plan of Care Patient           Patient will benefit from skilled therapeutic intervention in order to improve the following deficits and impairments:  Abnormal gait,Difficulty walking,Decreased balance,Decreased mobility,Postural dysfunction,Decreased strength,Impaired flexibility  Visit Diagnosis: Other abnormalities of gait and mobility  Other symptoms and signs involving the nervous system  Unsteadiness [name] feet  Muscle weakness (generalized)     Problem List Patient Active Problem List   Diagnosis Date Noted  . S/P TKR (total knee replacement), right 05/05/2019  . Osteoarthritis of right knee 05/02/2019  . Prostate cancer ([place]) 03/22/2018  . Malignant neoplasm of prostate ([place]) 02/27/2018    [place] Anagnos W. 10/25/2020, 7:33 PM  Frazier Butt., PT   [facility] [address] [place], [place], [address] Phone: [phone]   Fax:  [phone]  Name: Kyvon Hu MRN: [id number]  Date of Birth: 11-Oct-1950

## 2020-10-28 DIAGNOSIS — E7800 Pure hypercholesterolemia, unspecified: Secondary | ICD-10-CM | POA: Diagnosis not present

## 2020-10-28 DIAGNOSIS — C61 Malignant neoplasm of prostate: Secondary | ICD-10-CM | POA: Diagnosis not present

## 2020-10-28 DIAGNOSIS — I10 Essential (primary) hypertension: Secondary | ICD-10-CM | POA: Diagnosis not present

## 2020-10-28 DIAGNOSIS — M19049 Primary osteoarthritis, unspecified hand: Secondary | ICD-10-CM | POA: Diagnosis not present

## 2020-10-28 DIAGNOSIS — G20 Parkinson's disease: Secondary | ICD-10-CM | POA: Diagnosis not present

## 2020-10-29 ENCOUNTER — Ambulatory Visit: Payer: PPO | Admitting: Physical Therapy

## 2020-10-29 ENCOUNTER — Encounter: Payer: Self-pay | Admitting: Physical Therapy

## 2020-10-29 ENCOUNTER — Encounter: Payer: Self-pay | Admitting: Occupational Therapy

## 2020-10-29 ENCOUNTER — Other Ambulatory Visit: Payer: Self-pay

## 2020-10-29 ENCOUNTER — Ambulatory Visit: Payer: PPO | Admitting: Occupational Therapy

## 2020-10-29 ENCOUNTER — Ambulatory Visit: Payer: PPO

## 2020-10-29 DIAGNOSIS — R1310 Dysphagia, unspecified: Secondary | ICD-10-CM

## 2020-10-29 DIAGNOSIS — R2689 Other abnormalities of gait and mobility: Secondary | ICD-10-CM

## 2020-10-29 DIAGNOSIS — R293 Abnormal posture: Secondary | ICD-10-CM

## 2020-10-29 DIAGNOSIS — R29898 Other symptoms and signs involving the musculoskeletal system: Secondary | ICD-10-CM

## 2020-10-29 DIAGNOSIS — R2681 Unsteadiness on feet: Secondary | ICD-10-CM

## 2020-10-29 DIAGNOSIS — R278 Other lack of coordination: Secondary | ICD-10-CM

## 2020-10-29 DIAGNOSIS — M6281 Muscle weakness (generalized): Secondary | ICD-10-CM

## 2020-10-29 DIAGNOSIS — R471 Dysarthria and anarthria: Secondary | ICD-10-CM | POA: Diagnosis not present

## 2020-10-29 DIAGNOSIS — R29818 Other symptoms and signs involving the nervous system: Secondary | ICD-10-CM

## 2020-10-29 NOTE — Therapy (Signed)
[facility] [address] [place], [place], [address] Phone: [phone]   Fax:  [phone]  Physical Therapy Treatment  Patient Details  Name: Matthew [name] MRN: [id number] Date of Birth: 12/23/1950 Referring Provider (PT): Star Age   Encounter Date: 10/29/2020   PT End of Session - 10/29/20 1156     Visit Number 9    Number of Visits 13    Date for PT Re-Evaluation 52/84/13   90 day cert, 6 wk POC; pt wants to wait to schedule all 3 disciplines together   Authorization Type [employer]    Progress Note Due on Visit 10    PT Start Time 1107   pt running late from speech therapy and then needing to use restroom   PT Stop Time 1146    PT Time Calculation (min) 39 min    Activity Tolerance Patient tolerated treatment well    Behavior During Therapy [facility] for tasks assessed/performed             Past Medical History:  Diagnosis Date   Anxiety    Arthritis    GERD (gastroesophageal reflux disease)    occ remote history   Hypertension    Parkinson disease ([place])    Prostate cancer ([place])    Right knee pain    questionable meniscal tear   Seasonal allergies     Past Surgical History:  Procedure Laterality Date   COLONOSCOPY     Leg Laceration Repair  Left    Age 70   PELVIC LYMPH NODE DISSECTION Bilateral 03/22/2018   Procedure: PELVIC LYMPH NODE DISSECTION;  Surgeon: Ceasar Mons, MD;  [facility]: [facility];  Service: Urology;  Laterality: Bilateral;   PROSTATE BIOPSY     ROBOT ASSISTED LAPAROSCOPIC RADICAL PROSTATECTOMY N/A 03/22/2018   Procedure: XI ROBOTIC ASSISTED LAPAROSCOPIC PROSTATECTOMY;  Surgeon: Ceasar Mons, MD;  [facility];  Service: Urology;  Laterality: N/A;   TOTAL KNEE ARTHROPLASTY Right 05/05/2019   Procedure: RIGHT TOTAL KNEE ARTHROPLASTY;  Surgeon: Frederik Pear, MD;  [facility];  Service: Orthopedics;  Laterality: Right;    There were no  vitals filed for this visit.   Subjective Assessment - 10/29/20 1108     Subjective Went to a concert at [facility] on tuesday. Didn't bring in his walking poles today.    Pertinent History hx of prostate cancer/removal, incontinence, hx of R TKR 04/2019    Patient Stated Goals Pt's goals are to improve balance and try to avoid my overall slowed mobility.    Currently in Pain? No/denies                [facility] PT Assessment - 10/29/20 1112       Timed Up and Go Test   Normal TUG (seconds) 8.37                           [facility] Adult PT Treatment/Exercise - 10/29/20 1143       Ambulation/Gait   Ambulation/Gait Yes    Ambulation/Gait Assistance 5: Supervision    Ambulation/Gait Assistance Details with resistance belt and therapist following behind pt - x3 laps with cues for posture, step length and arm swing, then performed x2 laps without resistance with pt able to maintain stride length and with improve foot clearance/posture    Ambulation Distance (Feet) 345 Feet   x1, 230 x 1   Assistive device None    Gait  Pattern Step-through pattern;Decreased arm swing - right;Decreased arm swing - left;Decreased step length - right;Decreased step length - left;Right flexed knee in stance;Left flexed knee in stance;Decreased trunk rotation;Trunk flexed    Ambulation Surface Level;Indoor            Pt performs PWR! Moves in standing position on blue air ex - at edge of mat table with chair in front for balance:    PWR! Up for improved posture x10 reps   PWR! [name] for improved weighshifting x10 reps B  PWR! Twist for improved trunk rotation x10 reps B - cues to reset with tall posture in the middle  PWR! Step for improved step initiation - x10 reps B, cues for incr foot clearance, esp with LLE.    Reviewed from pt's HEP: -hamstring stretch 2 x 30 seconds B -reviewed forward then backwards stepping with UE movements as part of HEP x10 reps B, needing initial cues for hip  hinge when stepping backwards. Pt performing much better today.        Balance Exercises - 10/29/20 0001       Balance Exercises: Standing   Stepping Strategy Posterior;Anterior;Foam/compliant surface;UE support;10 reps;Limitations    Stepping Strategy Limitations x10 reps alternating each direction, with posterior direction, cues for hip hinge. Standing on blue air ex                 PT Short Term Goals - 10/29/20 1114       PT SHORT TERM GOAL #1   Title Pt will be indepedent with Parkinson's specific HEP to address balance, transfers, gait for improved overall mobility.  TARGET 10/29/2020 (delayed date due to delayed start-Pt wants OT, PT, speech together)    Time 4    Period Weeks    Status Achieved      PT SHORT TERM GOAL #2   Title Pt will perform at least 8 of 10 reps of sit<>stand transfers from < 18 inch surfaces, with no posterior lean or LOB.    Time 4    Period Weeks    Status Achieved      PT SHORT TERM GOAL #3   Title Pt will improve TUG score to less than or equal to 13.5 sec for decreased fall risk.    Baseline 14.69; 8.37 seconds on 10/29/20    Time 4    Period Weeks    Status Achieved      PT SHORT TERM GOAL #4   Title 6 MWT to be assessed and goal written as appropriate.    Baseline 931' with LTG written on 10/05/20; 1036 ft 10/25/2020    Time 4    Period Weeks    Status Achieved               PT Long Term Goals - 10/25/20 1928       PT LONG TERM GOAL #1   Title Pt will be independent with progression of HEP for Parkinson's related deficits.  TARGET 11/19/2020(delayed due to delayed start for coordination of schedules)    Time 6    Period Weeks    Status On-going      PT LONG TERM GOAL #2   Title Pt will improve 5x sit<>stand to less than or equal to 10 sec for improved transfer efficiency and safety.    Baseline 11.97 sec    Time 6    Period Weeks    Status On-going      PT LONG TERM GOAL #3  Title Pt will improve MiniBESTest  score to at least 25/28 for decreased fall risk.    Baseline 23/28    Time 6    Period Weeks    Status On-going      PT LONG TERM GOAL #4   Title Pt will improve TUG cognitive score to less than or equal to 15 sec for improved dual tasking with gait.    Baseline 15.31 sec    Time 6    Period Weeks    Status On-going      PT LONG TERM GOAL #5   Title Pt will verbalize plans for continued community fitness upon d/c from PT.    Time 6    Period Weeks    Status On-going      PT LONG TERM GOAL #6   Title Pt will improve 6MWT distance by at least 200' from baseline in order to demo improved gait endurance for community distances.    Baseline 931' on 10/05/20; 1036 10/25/20    Time 6    Period Weeks    Status Revised                   Plan - 10/29/20 1210     Clinical Impression Statement Today's session focused on assessing remainder of STGs. Pt met STG #1 in regards to HEP and STG #3 for TUG. Pt improved TUG score to 8.37 seconds (previously was 14.69 seconds), decr pt's risk of falls. Performed standing PWR moves today on blue air ex, with pt needing intermittent UE support with balance. With forwards resisted gait, pt able to demo improved posture and stride length after resistance is removed. Will continue to progress towards LTGs.    Personal Factors and Comorbidities Comorbidity 3+    Comorbidities anxiety, arthritis, GERD, prostate cancer, HTN, s/p R TKR 04/2019    Examination-Activity Limitations Locomotion Level;Transfers;Stand;Stairs    Examination-Participation Restrictions Community Activity;Other   playing with grandchildren   Stability/Clinical Decision Making Evolving/Moderate complexity    Rehab Potential Good    PT Frequency 2x / week    PT Duration 6 weeks   plus eval   PT Treatment/Interventions ADLs/Self Care Home Management;Gait training;Functional mobility training;Therapeutic activities;Therapeutic exercise;Balance training;Neuromuscular re-education;DME  Instruction;Patient/family education    PT Next Visit Plan will need 10th visit PN. continue standing PWR flow or PWR moves on compliant surfaces, continue step strategies for balance;  try resisted gait in other directions.    Consulted and Agree with Plan of Care Patient             Patient will benefit from skilled therapeutic intervention in order to improve the following deficits and impairments:  Abnormal gait, Difficulty walking, Decreased balance, Decreased mobility, Postural dysfunction, Decreased strength, Impaired flexibility  Visit Diagnosis: Other abnormalities of gait and mobility  Other symptoms and signs involving the nervous system  Abnormal posture     Problem List Patient Active Problem List   Diagnosis Date Noted   S/P TKR (total knee replacement), right 05/05/2019   Osteoarthritis of right knee 05/02/2019   Prostate cancer ([place]) 03/22/2018   Malignant neoplasm of prostate ([place]) 02/27/2018    Arliss Journey, PT, DPT  10/29/2020, 12:11 PM  [facility] [address] [place], [place], [address] Phone: [phone]   Fax:  [phone]  Name: Keller Bounds MRN: [id number] Date of Birth: 06/24/1950

## 2020-10-29 NOTE — Therapy (Signed)
[facility] [address] [place], [place], [address] Phone: [phone]   Fax:  [phone]  Occupational Therapy Treatment  Patient Details  Name: Matthew [name] MRN: [id number] Date of Birth: Oct 28, 1950 Referring Provider (OT): Dr. Rexene Alberts   Encounter Date: 10/29/2020   OT End of Session - 10/29/20 1149     Visit Number 9    Number of Visits 17    Date for OT Re-Evaluation 11/18/20    Authorization Type [employer]    Authorization Time Period POC written for 8 weeks, scheduled through 6/29    Authorization - Visit Number 9    Authorization - Number of Visits 10    Progress Note Due on Visit 10    OT Start Time 1147    OT Stop Time 1227    OT Time Calculation (min) 40 min    Activity Tolerance Patient tolerated treatment well    Behavior During Therapy [facility] for tasks assessed/performed             Past Medical History:  Diagnosis Date   Anxiety    Arthritis    GERD (gastroesophageal reflux disease)    occ remote history   Hypertension    Parkinson disease ([place])    Prostate cancer ([place])    Right knee pain    questionable meniscal tear   Seasonal allergies     Past Surgical History:  Procedure Laterality Date   COLONOSCOPY     Leg Laceration Repair  Left    Age 70   PELVIC LYMPH NODE DISSECTION Bilateral 03/22/2018   Procedure: PELVIC LYMPH NODE DISSECTION;  Surgeon: Ceasar Mons, MD;  [facility];  Service: Urology;  Laterality: Bilateral;   PROSTATE BIOPSY     ROBOT ASSISTED LAPAROSCOPIC RADICAL PROSTATECTOMY N/A 03/22/2018   Procedure: XI ROBOTIC ASSISTED LAPAROSCOPIC PROSTATECTOMY;  Surgeon: Ceasar Mons, MD;  [facility];  Service: Urology;  Laterality: N/A;   TOTAL KNEE ARTHROPLASTY Right 05/05/2019   Procedure: RIGHT TOTAL KNEE ARTHROPLASTY;  Surgeon: Frederik Pear, MD;  [facility];  Service: Orthopedics;  Laterality: Right;    There were no vitals  filed for this visit.   Subjective Assessment - 10/29/20 1149     Subjective  Deneis pain    Pertinent History PMH:  PD, anxiety, arthritis, GERD, prostate cancer, HTN, s/p R TKR 04/2019    Patient Stated Goals improve ADL performance, balance    Currently in Pain? No/denies                   Treatment: Standing to perform bilateral simultaneous functional overhead reaching to  place large pegs in pegboard with elbow extension and upright posture, min difficulty and min v.c for  big movements.               OT Education - 10/29/20 1220     Education Details Strategies for eating, use of foam grips, choke down on spoon, turn spoon to have point at mouth, Pt practiced cutting with big movements with finger ontop of butter knife blade with good sucess. Fastening buttons with PWR! hands and adaped techiniques with improved performance, min v.c and demonstration.   Person(s) Educated Patient    Methods Explanation;Demonstration;Verbal cues    Comprehension Verbalized understanding;Returned demonstration;Verbal cues required              OT Short Term Goals - 10/22/20 1200       OT SHORT TERM GOAL #1  Title I with PD specific HEP    Time 4    Period Weeks    Status On-going   may benefit from reinforcement   Target Date 10/21/20      OT SHORT TERM GOAL #2   Title Pt will verbalize understanding of adapted strategies to maximize safety and I with ADLs/ IADLs .    Time 4    Period Weeks    Status On-going   can benefit from reinforcement     OT SHORT TERM GOAL #3   Title Pt will demonstrate improved UE funcitonal use as eviedenced by increaseing box/ blocks by 3 blocks bilaterally.    Baseline RUE 46 LUE 42 blocks ,    Time 4    Period Weeks    Status On-going   Partially met 10/19/20:  R-44blocks, L-45blocks (met with LUE, not met with RUE)     OT SHORT TERM GOAL #4   Title Pt will demonstrate increased ease with dressing as evidenced by decreasing  PPT#4(don/ doff jacket) to 25 secs or less    Baseline 32.35    Time 4    Period Weeks    Status On-going   27.03     OT SHORT TERM GOAL #5   Title Pt will retrieve a lightweight object from overhead shelf at 120 shoulder flexion with left and right UE's    Time 4    Period Weeks    Status Achieved   10/19/20:  LUE 140* (-5* elbow ext), RUE 130* (-10* elbow ext)              OT Long Term Goals - 09/16/20 1603       OT LONG TERM GOAL #1   Title Pt will verbalize understanding of ways to prevent future PD related complications and PD community resources following review    Time 8    Period Weeks    Status New    Target Date 11/18/20      OT LONG TERM GOAL #2   Title Pt will demonstrate improved fine motor coordination for ADLs as evidenced by decreasing 9 hole peg test score for RUE by 3 secs    Baseline 38    Time 8    Period Weeks    Status New      OT LONG TERM GOAL #3   Title Pt will demonstrate improved ease with dressing as eveidenced by decreasing 3 button/ unbutton time to 30 secs or less    Baseline 34.09    Time 8    Period Weeks    Status Achieved      OT LONG TERM GOAL #4   Title Pt will report pain in right shoulder no greater than 2/10 for ADLs    Time 8    Period Weeks    Status New      OT LONG TERM GOAL #5   Title .    Time --    Period --                   Plan - 10/29/20 1202     Clinical Impression Statement Pt is progressing towards goals.  Pt is progressing towards goals for adapted strategies for ADLs.    OT Occupational Profile and History Detailed Assessment- Review of Records and additional review of physical, cognitive, psychosocial history related to current functional performance    Occupational performance deficits (Please refer to evaluation for details): ADL's;IADL's;Leisure;Social Participation  Body Structure / Function / Physical Skills ADL;Balance;Mobility;Strength;Tone;UE functional  use;Flexibility;FMC;Coordination;Decreased knowledge of precautions;GMC;ROM;Decreased knowledge of use of DME;Dexterity;IADL    OT Frequency 2x / week    OT Duration 8 weeks    OT Treatment/Interventions Self-care/ADL training;Therapeutic exercise;Functional Mobility Training;Balance training;Manual Therapy;Neuromuscular education;Ultrasound;Aquatic Therapy;Energy conservation;Therapeutic activities;DME and/or AE instruction;Paraffin;Cryotherapy;Fluidtherapy;Gait Training;Moist Heat;Contrast Bath;Passive range of motion;Patient/family education    Plan continue with ADL strategies address LB dressing, functional reaching with elbow extension and cueing for posture/positioning, monitor R shoulder pain, coordination/timing    OT Home Exercise Plan Supine PWR! up and rock, closed-chain shoulder flex in supine    Consulted and Agree with Plan of Care Patient             Patient will benefit from skilled therapeutic intervention in order to improve the following deficits and impairments:   Body Structure / Function / Physical Skills: ADL, Balance, Mobility, Strength, Tone, UE functional use, Flexibility, FMC, Coordination, Decreased knowledge of precautions, GMC, ROM, Decreased knowledge of use of DME, Dexterity, IADL       Visit Diagnosis: Other abnormalities of gait and mobility  Other symptoms and signs involving the nervous system  Abnormal posture  Other lack of coordination  Other symptoms and signs involving the musculoskeletal system  Unsteadiness on feet  Muscle weakness (generalized)    Problem List Patient Active Problem List   Diagnosis Date Noted   S/P TKR (total knee replacement), right 05/05/2019   Osteoarthritis of right knee 05/02/2019   Prostate cancer ([place]) 03/22/2018   Malignant neoplasm of prostate ([place]) 02/27/2018    Anona Giovannini 10/29/2020, 1:40 PM  [facility] [address] [place], [place], [address] Phone: [phone]   Fax:  [phone]  Name: Matthew [name] MRN: [id number] Date of Birth: 03/14/1951

## 2020-10-29 NOTE — Therapy (Signed)
[facility] [address] [place], [place], [address] Phone: [phone]   Fax:  [phone]  Speech Language Pathology Treatment  Patient Details  Name: Matthew [name] MRN: [id number] Date of Birth: 03/29/1951 Referring Provider (SLP): Dr. Rexene Alberts   Encounter Date: 10/29/2020   End of Session - 10/29/20 1140     Visit Number 8    Number of Visits 17    Date for SLP Re-Evaluation 12/15/20    SLP Start Time 1018    SLP Stop Time  1100    SLP Time Calculation (min) 42 min    Activity Tolerance Patient tolerated treatment well             Past Medical History:  Diagnosis Date   Anxiety    Arthritis    GERD (gastroesophageal reflux disease)    occ remote history   Hypertension    Parkinson disease ([place])    Prostate cancer ([place])    Right knee pain    questionable meniscal tear   Seasonal allergies     Past Surgical History:  Procedure Laterality Date   COLONOSCOPY     Leg Laceration Repair  Left    Age 70   PELVIC LYMPH NODE DISSECTION Bilateral 03/22/2018   Procedure: PELVIC LYMPH NODE DISSECTION;  Surgeon: Ceasar Mons, MD;  [facility];  Service: Urology;  Laterality: Bilateral;   PROSTATE BIOPSY     ROBOT ASSISTED LAPAROSCOPIC RADICAL PROSTATECTOMY N/A 03/22/2018   Procedure: XI ROBOTIC ASSISTED LAPAROSCOPIC PROSTATECTOMY;  Surgeon: Ceasar Mons, MD;  [facility];  Service: Urology;  Laterality: N/A;   TOTAL KNEE ARTHROPLASTY Right 05/05/2019   Procedure: RIGHT TOTAL KNEE ARTHROPLASTY;  Surgeon: Frederik Pear, MD;  [facility];  Service: Orthopedics;  Laterality: Right;    There were no vitals filed for this visit.   Subjective Assessment - 10/29/20 1028     Subjective "It's been a really busy week."    Currently in Pain? No/denies                   ADULT SLP TREATMENT - 10/29/20 1034       General Information   Behavior/Cognition  Alert;Cooperative;Pleasant mood      Treatment Provided   Treatment provided Cognitive-Linquistic      Cognitive-Linquistic Treatment   Treatment focused on Dysarthria    Skilled Treatment Conversation for 18 mintues initially upon pt entering ST room - pt maintained WNL volume with self-correction when volume dipped below WNL for <10 seconds. Abcominal breathing (AB) observed at leats 80% of the time. Loud /a/ average in mid-upper 80s dB, and sentences generated with lower 80s dB. SLP then took pt outdoors and pt maintained speech intelligiblity of 100% fo 8 minutes with traffic noise.      Assessment / Recommendations / Plan   Plan Continue with current plan of care      Progression Toward Goals   Progression toward goals Progressing toward goals                SLP Short Term Goals - 10/29/20 1141       SLP SHORT TERM GOAL #1   Title pt will produce /a/ with average upper 80s dB at 30 cm over 3 sessions    Baseline 10-14-20, 10-29-20    Status Partially Met      SLP SHORT TERM GOAL #2   Title pt will demo abdominal breathing with sentences 90% accuracy x  3 sessions    Baseline 10-22-20, 10-25-20    Status Achieved      SLP SHORT TERM GOAL #3   Title pt will generate 8 minutes simple conversation with low 70s dB average in 3 sessions    Baseline 10-12-20, 10-22-20, 10-25-20    Status Achieved      SLP SHORT TERM GOAL #4   Title pt will complete clinical swallowing assessment if indicated.    Status Achieved      SLP SHORT TERM GOAL #5   Title pt will undergo objective swallow assessment (MBSS or FEES) if clinically indicated    Status Deferred              SLP Long Term Goals - 10/29/20 1142       SLP LONG TERM GOAL #1   Title pt will use abdominal breathing >80% of the time during 10 minute simple conversation in 3 sessions    Baseline 10-22-20, 10-25-20    Period --   or 17 total visits, for all LTGs   Status Achieved      SLP LONG TERM GOAL #2   Title pt will  maintain WNL vocal quality/intensity in 10 minute simple conversation outside [place] room x 3 sessions    Baseline 10-25-20, 10-29-20    Time 5    Period Weeks    Status On-going      SLP LONG TERM GOAL #3   Title pt will report fewer requests to repeat himself than prior to ST    Time 5    Period Weeks    Status On-going      SLP LONG TERM GOAL #4   Title pt will tell SLP 3 overt s/s difficulty oropharyngeal dysphagia with modified independence    Time 5    Period Weeks    Status On-going      SLP LONG TERM GOAL #5   Title pt will report less frequent drooling than prior to eval    Time 5    Period Weeks    Status On-going              Plan - 10/29/20 1140     Clinical Impression Statement Patient presents with 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 Pt's conversational speech is beginning to be carried over more and more into spontaneous conversation during the session. SLP to cont monitor swallow function and pt may still require objective swallow eval at some point. I recommend skilled ST to address communication and if necessary, dysphagia, in order to improve pt's communication and quality of life.    Speech Therapy Frequency 2x / week    Duration 8 weeks   or 17 visits   Treatment/Interventions Aspiration precaution training;Language facilitation;Environmental controls;Cueing hierarchy;SLP instruction and feedback;Compensatory techniques;Functional tasks;Compensatory strategies;Patient/family education;Diet toleration management by SLP;Other (comment)   MBS if warranted   Potential to Achieve Goals Good    Consulted and Agree with Plan of Care Patient             Patient will benefit from skilled therapeutic intervention in order to improve the following deficits and impairments:   Dysarthria and anarthria  Dysphagia, unspecified type    Problem List Patient Active Problem List   Diagnosis Date Noted   S/P TKR  (total knee replacement), right 05/05/2019   Osteoarthritis of right knee 05/02/2019   Prostate cancer ([place]) 03/22/2018   Malignant neoplasm of prostate ([place]) 02/27/2018    [facility] ,[place], [place]  10/29/2020, 11:45 AM  [facility] [address] [place], [place], [address] Phone: [phone]   Fax:  [phone]   Name: Carrson Lightcap MRN: [id number] Date of Birth: 1950-06-12

## 2020-11-02 ENCOUNTER — Other Ambulatory Visit: Payer: Self-pay

## 2020-11-02 ENCOUNTER — Ambulatory Visit: Payer: PPO | Admitting: Physical Therapy

## 2020-11-02 ENCOUNTER — Encounter: Payer: Self-pay | Admitting: Physical Therapy

## 2020-11-02 ENCOUNTER — Ambulatory Visit: Payer: PPO | Admitting: Occupational Therapy

## 2020-11-02 ENCOUNTER — Encounter: Payer: Self-pay | Admitting: Occupational Therapy

## 2020-11-02 VITALS — BP 141/73 | HR 54

## 2020-11-02 VITALS — BP 121/72 | HR 54

## 2020-11-02 DIAGNOSIS — R29818 Other symptoms and signs involving the nervous system: Secondary | ICD-10-CM

## 2020-11-02 DIAGNOSIS — R2689 Other abnormalities of gait and mobility: Secondary | ICD-10-CM

## 2020-11-02 DIAGNOSIS — R278 Other lack of coordination: Secondary | ICD-10-CM

## 2020-11-02 DIAGNOSIS — R293 Abnormal posture: Secondary | ICD-10-CM

## 2020-11-02 DIAGNOSIS — R29898 Other symptoms and signs involving the musculoskeletal system: Secondary | ICD-10-CM

## 2020-11-02 DIAGNOSIS — R471 Dysarthria and anarthria: Secondary | ICD-10-CM | POA: Diagnosis not present

## 2020-11-02 DIAGNOSIS — R2681 Unsteadiness on feet: Secondary | ICD-10-CM

## 2020-11-02 NOTE — Telephone Encounter (Signed)
Pt's wife, Matthew [name] (on [place]) called, He is having major side effects to  rotigotine (NEUPRO) 4 MG/24HR: lightheadedness, puffiness in feet and legs (leaves a dent when you press it). Want to discontinue this medication. Would like a call from the nurse.

## 2020-11-02 NOTE — Addendum Note (Signed)
Addended by: Verlin Grills on: 11/02/2020 04:54 PM   Modules accepted: Orders

## 2020-11-02 NOTE — Therapy (Signed)
[facility] [address] [place], [place], [address] Phone: [phone]   Fax:  [phone]  Occupational Therapy Treatment  Patient Details  Name: Matthew [name] MRN: [id number] Date of Birth: 1951-02-20 Referring Provider (OT): Dr. Rexene Alberts   Encounter Date: 11/02/2020   OT End of Session - 11/02/20 1104     Visit Number 10    Number of Visits 17    Date for OT Re-Evaluation 11/18/20    Authorization Type [employer]    Authorization Time Period POC written for 8 weeks, scheduled through 6/29    Authorization - Visit Number 10    Authorization - Number of Visits 10    Progress Note Due on Visit 20    OT Start Time 1104    OT Stop Time 1145    OT Time Calculation (min) 41 min    Activity Tolerance Patient tolerated treatment well    Behavior During Therapy [facility] for tasks assessed/performed             Past Medical History:  Diagnosis Date   Anxiety    Arthritis    GERD (gastroesophageal reflux disease)    occ remote history   Hypertension    Parkinson disease ([place])    Prostate cancer ([place])    Right knee pain    questionable meniscal tear   Seasonal allergies     Past Surgical History:  Procedure Laterality Date   COLONOSCOPY     Leg Laceration Repair  Left    Age 51   PELVIC LYMPH NODE DISSECTION Bilateral 03/22/2018   Procedure: PELVIC LYMPH NODE DISSECTION;  Surgeon: Ceasar Mons, MD;  Location: [facility];  Service: Urology;  Laterality: Bilateral;   PROSTATE BIOPSY     ROBOT ASSISTED LAPAROSCOPIC RADICAL PROSTATECTOMY N/A 03/22/2018   Procedure: XI ROBOTIC ASSISTED LAPAROSCOPIC PROSTATECTOMY;  Surgeon: Ceasar Mons, MD;  Location: [facility];  Service: Urology;  Laterality: N/A;   TOTAL KNEE ARTHROPLASTY Right 05/05/2019   Procedure: RIGHT TOTAL KNEE ARTHROPLASTY;  Surgeon: Frederik Pear, MD;  Location: [facility];  Service: Orthopedics;  Laterality: Right;    Vitals:   11/02/20  1110  BP: 121/72  Pulse: (!) 54     Subjective Assessment - 11/02/20 1104     Subjective  started incr dose of Neupro Friday and have been having lightheadedness and more off balance and swelling in feet/ankles and tingling in feet.  Pt reports that using the "shoe funnel' may be "life changing"    Pertinent History PMH:  PD, anxiety, arthritis, GERD, prostate cancer, HTN, s/p R TKR 04/2019    Limitations hx of CA, use caution with modalities.    Patient Stated Goals improve ADL performance, balance    Currently in Pain? No/denies                    OT Education - 11/02/20 1215     Education Details Strategies for ADLs (adaptive and use of large amplitude movements)--see pt instructions    Person(s) Educated Patient    Methods Explanation;Demonstration;Verbal cues;Handout    Comprehension Verbalized understanding;Returned demonstration;Verbal cues required              OT Short Term Goals - 10/22/20 1200       OT SHORT TERM GOAL #1   Title I with PD specific HEP    Time 4    Period Weeks    Status On-going   may benefit from reinforcement  Target Date 10/21/20      OT SHORT TERM GOAL #2   Title Pt will verbalize understanding of adapted strategies to maximize safety and I with ADLs/ IADLs .    Time 4    Period Weeks    Status On-going   can benefit from reinforcement     OT SHORT TERM GOAL #3   Title Pt will demonstrate improved UE funcitonal use as eviedenced by increaseing box/ blocks by 3 blocks bilaterally.    Baseline RUE 46 LUE 42 blocks ,    Time 4    Period Weeks    Status On-going   Partially met 10/19/20:  R-44blocks, L-45blocks (met with LUE, not met with RUE)     OT SHORT TERM GOAL #4   Title Pt will demonstrate increased ease with dressing as evidenced by decreasing PPT#4(don/ doff jacket) to 25 secs or less    Baseline 32.35    Time 4    Period Weeks    Status On-going   27.03     OT SHORT TERM GOAL #5   Title Pt will retrieve a  lightweight object from overhead shelf at 120 shoulder flexion with left and right UE's    Time 4    Period Weeks    Status Achieved   10/19/20:  LUE 140* (-5* elbow ext), RUE 130* (-10* elbow ext)              OT Long Term Goals - 09/16/20 1603       OT LONG TERM GOAL #1   Title Pt will verbalize understanding of ways to prevent future PD related complications and PD community resources following review    Time 8    Period Weeks    Status New    Target Date 11/18/20      OT LONG TERM GOAL #2   Title Pt will demonstrate improved fine motor coordination for ADLs as evidenced by decreasing 9 hole peg test score for RUE by 3 secs    Baseline 38    Time 8    Period Weeks    Status New      OT LONG TERM GOAL #3   Title Pt will demonstrate improved ease with dressing as eveidenced by decreasing 3 button/ unbutton time to 30 secs or less    Baseline 34.09    Time 8    Period Weeks    Status Achieved      OT LONG TERM GOAL #4   Title Pt will report pain in right shoulder no greater than 2/10 for ADLs    Time 8    Period Weeks    Status New      OT LONG TERM GOAL #5   Title .    Time --    Period --                   Plan - 11/02/20 1114     Clinical Impression Statement Progress Noted Reporting Period 09/16/20-11/02/20:  Pt is progressing towards goals with improved ROM and decreasing bradykinesia.  Pt is progressing towards goals for adapted strategies for ADLs, but could benefit from further reinforement.  Pt would benefit from continued occupational therapy to incr ease/safety with ADLs/IADLs, improve quality of life, decr risk for furture complications, and ensure larger amplitude movement calibration into functional tasks.    OT Occupational Profile and History Detailed Assessment- Review of Records and additional review of physical, cognitive, psychosocial history related to  current functional performance    Occupational performance deficits (Please refer to  evaluation for details): ADL's;IADL's;Leisure;Social Participation    Body Structure / Function / Physical Skills ADL;Balance;Mobility;Strength;Tone;UE functional use;Flexibility;FMC;Coordination;Decreased knowledge of precautions;GMC;ROM;Decreased knowledge of use of DME;Dexterity;IADL    OT Frequency 2x / week    OT Duration 8 weeks    OT Treatment/Interventions Self-care/ADL training;Therapeutic exercise;Functional Mobility Training;Balance training;Manual Therapy;Neuromuscular education;Ultrasound;Aquatic Therapy;Energy conservation;Therapeutic activities;DME and/or AE instruction;Paraffin;Cryotherapy;Fluidtherapy;Gait Training;Moist Heat;Contrast Bath;Passive range of motion;Patient/family education    Plan continue with ADL strategies and further address LB dressing prn, functional reaching with elbow extension and cueing for posture/positioning, monitor R shoulder pain, coordination/timing    OT Home Exercise Plan Supine PWR! up and rock, closed-chain shoulder flex in supine    Consulted and Agree with Plan of Care Patient             Patient will benefit from skilled therapeutic intervention in order to improve the following deficits and impairments:   Body Structure / Function / Physical Skills: ADL, Balance, Mobility, Strength, Tone, UE functional use, Flexibility, FMC, Coordination, Decreased knowledge of precautions, GMC, ROM, Decreased knowledge of use of DME, Dexterity, IADL       Visit Diagnosis: Other symptoms and signs involving the nervous system  Abnormal posture  Other lack of coordination  Other symptoms and signs involving the musculoskeletal system  Unsteadiness on feet    Problem List Patient Active Problem List   Diagnosis Date Noted   S/P TKR (total knee replacement), right 05/05/2019   Osteoarthritis of right knee 05/02/2019   Prostate cancer ([place]) 03/22/2018   Malignant neoplasm of prostate ([place]) 02/27/2018    [facility] 11/02/2020, 12:24  PM  [facility] [address] [place], [place], [address] Phone: [phone]   Fax:  [phone]  Name: Antowan Samford MRN: [id number] Date of Birth: September 18, 1950  Vianne Bulls, OTR/L [facility] [address] [place], [place]  [address] [phone] phone [phone] 11/02/20 12:24 PM

## 2020-11-02 NOTE — Patient Instructions (Addendum)
     To take shirt off:  -  Try to open hand big and grab behind neck to pull over head.  -  Keep feet apart bigger if standing, or try sitting.  -  Try to pull shirt off from the bottom.    To put shirt on:   -  Keep feet apart bigger if standing, or try sitting.  - Good upright posture and bring shirt over head, don't bring head down to shirt.   - Open hand to pull shirt down, get more material in your hand.  - Gather bottom of shirt up to head hole (make sure end is turned out).   To put on pants/underwear:  - Sit down on firm chair/surface  - Gather material in your hand, keep hands apart to open hole wider  - Try crossing your leg to put foot in or use a stepstool  - Put in right leg in first.  To put on shoes:  Try "shoe funnel"  - Try elastic shoelaces  To put on socks:  gather sock/get thumb down further in sock, spread hands apart

## 2020-11-02 NOTE — Telephone Encounter (Signed)
See my chart message from 11/02/20.  

## 2020-11-02 NOTE — Therapy (Signed)
[facility] [address] [place], [place], [address] Phone: [phone]   Fax:  [phone]  Physical Therapy Treatment/10th Visit Progress Note  Patient Details  Name: Matthew [name] MRN: [id number] Date of Birth: [birth year] Referring Provider (PT): Star Age  10th Visit Physical Therapy Progress Note  Dates of Reporting Period: 09/16/20 to 11/02/20   Encounter Date: 11/02/2020   PT End of Session - 11/02/20 1231     Visit Number 10    Number of Visits 13    Date for PT Re-Evaluation 40/97/35   90 day cert, 6 wk POC; pt wants to wait to schedule all 3 disciplines together   Authorization Type [employer]    Progress Note Due on Visit 10    PT Start Time 1148    PT Stop Time 1229    PT Time Calculation (min) 41 min    Activity Tolerance Patient tolerated treatment well    Behavior During Therapy [facility] for tasks assessed/performed             Past Medical History:  Diagnosis Date   Anxiety    Arthritis    GERD (gastroesophageal reflux disease)    occ remote history   Hypertension    Parkinson disease ([place])    Prostate cancer ([place])    Right knee pain    questionable meniscal tear   Seasonal allergies     Past Surgical History:  Procedure Laterality Date   COLONOSCOPY     Leg Laceration Repair  Left    Age 70   PELVIC LYMPH NODE DISSECTION Bilateral 03/22/2018   Procedure: PELVIC LYMPH NODE DISSECTION;  Surgeon: Ceasar Mons, MD;  [facility];  Service: Urology;  Laterality: Bilateral;   PROSTATE BIOPSY     ROBOT ASSISTED LAPAROSCOPIC RADICAL PROSTATECTOMY N/A 03/22/2018   Procedure: XI ROBOTIC ASSISTED LAPAROSCOPIC PROSTATECTOMY;  Surgeon: Ceasar Mons, MD;  [facility];  Service: Urology;  Laterality: N/A;   TOTAL KNEE ARTHROPLASTY Right 05/05/2019   Procedure: RIGHT TOTAL KNEE ARTHROPLASTY;  Surgeon: Frederik Pear, MD;  [facility];  Service:  Orthopedics;  Laterality: Right;    Vitals:   11/02/20 1156 11/02/20 1158  BP: 130/74 (!) 141/73  Pulse: (!) 54 (!) 54     Subjective Assessment - 11/02/20 1151     Subjective Dr. Rexene Alberts changed his meds to putting 2 patches of NEUPRO on per day. Having feet and ankle swelling and feeling more lightheaded, feeling more severe when standing, but also notices it in sitting. Was worse on Friday/Saturday (which is around when he started taking the medications). Called doctors office and they are aware.    Pertinent History hx of prostate cancer/removal, incontinence, hx of R TKR 04/2019    Patient Stated Goals Pt's goals are to improve balance and try to avoid my overall slowed mobility.    Currently in Pain? No/denies                               [facility] Adult PT Treatment/Exercise - 11/02/20 1202       Transfers   Transfers Sit to Stand;Stand to Sit    Sit to Stand 6: Modified independent (Device/Increase time);From chair/3-in-1    Stand to Sit 6: Modified independent (Device/Increase time);To chair/3-in-1    Comments from lower chair surface x3 reps, needing cues for larger ROM when rocking to come up to standing  Ambulation/Gait   Ambulation/Gait Yes    Ambulation/Gait Assistance 5: Supervision    Ambulation/Gait Assistance Details between activities throughout session, cues for stride length and posture    Assistive device None    Gait Pattern Step-through pattern;Decreased arm swing - right;Decreased arm swing - left;Decreased step length - right;Decreased step length - left;Right flexed knee in stance;Left flexed knee in stance;Decreased trunk rotation;Trunk flexed    Ambulation Surface Level;Indoor      Therapeutic Activites    Therapeutic Activities Other Therapeutic Activities    Other Therapeutic Activities pt reporting feeling more lightheaded from his medication change, assessed BP  in sitting/standing with no orthostatics noted, but educated pt to  make sure that he is taking his time wiht transfers, and when coming to stand, performing rocking at first before initiating gait and waiting a few seconds                 Balance Exercises - 11/02/20 1219       Balance Exercises: Standing   SLS with Vectors Foam/compliant surface;Limitations    SLS with Vectors Limitations On blue air ex: standing at bottom of stair case alternating toe taps to 1st step and then 2nd step x15 reps B, cues for foot clearance    Stepping Strategy Posterior;10 reps;Limitations    Stepping Strategy Limitations on rockerboard without UE support, cues for hip hinge when stepping backwards    Rockerboard Anterior/posterior;EO;Limitations    Rockerboard Limitations weight shifting forwards/backwards with cues for hip/ankle strategies x15 reps and adding in rocking and then adding in BUE arm swing with cues for incr ROM x10 reps    Retro Gait 4 reps;Limitations    Retro Gait Limitations with resistance belt in // bars, initial cues for incr step length    Other Standing Exercises standing on blue air ex: step up/up and down/down up onto 6" step and then back onto foam x10 reps, pt with more unsteadiness stepping back down to foam               PT Education - 11/02/20 1231     Education Details see TA    Person(s) Educated Patient    Methods Explanation;Demonstration    Comprehension Verbalized understanding;Returned demonstration              PT Short Term Goals - 10/29/20 1114       PT SHORT TERM GOAL #1   Title Pt will be indepedent with Parkinson's specific HEP to address balance, transfers, gait for improved overall mobility.  TARGET 10/29/2020 (delayed date due to delayed start-Pt wants OT, PT, speech together)    Time 4    Period Weeks    Status Achieved      PT SHORT TERM GOAL #2   Title Pt will perform at least 8 of 10 reps of sit<>stand transfers from < 18 inch surfaces, with no posterior lean or LOB.    Time 4    Period Weeks     Status Achieved      PT SHORT TERM GOAL #3   Title Pt will improve TUG score to less than or equal to 13.5 sec for decreased fall risk.    Baseline 14.69; 8.37 seconds on 10/29/20    Time 4    Period Weeks    Status Achieved      PT SHORT TERM GOAL #4   Title 6 MWT to be assessed and goal written as appropriate.    Baseline 931' with LTG  written on 10/05/20; 1036 ft 10/25/2020    Time 4    Period Weeks    Status Achieved               PT Long Term Goals - 10/25/20 1928       PT LONG TERM GOAL #1   Title Pt will be independent with progression of HEP for Parkinson's related deficits.  TARGET 11/19/2020(delayed due to delayed start for coordination of schedules)    Time 6    Period Weeks    Status On-going      PT LONG TERM GOAL #2   Title Pt will improve 5x sit<>stand to less than or equal to 10 sec for improved transfer efficiency and safety.    Baseline 11.97 sec    Time 6    Period Weeks    Status On-going      PT LONG TERM GOAL #3   Title Pt will improve MiniBESTest score to at least 25/28 for decreased fall risk.    Baseline 23/28    Time 6    Period Weeks    Status On-going      PT LONG TERM GOAL #4   Title Pt will improve TUG cognitive score to less than or equal to 15 sec for improved dual tasking with gait.    Baseline 15.31 sec    Time 6    Period Weeks    Status On-going      PT LONG TERM GOAL #5   Title Pt will verbalize plans for continued community fitness upon d/c from PT.    Time 6    Period Weeks    Status On-going      PT LONG TERM GOAL #6   Title Pt will improve 6MWT distance by at least 200' from baseline in order to demo improved gait endurance for community distances.    Baseline 931' on 10/05/20; 1036 10/25/20    Time 6    Period Weeks    Status Revised                   Plan - 11/02/20 1438     Clinical Impression Statement 10th visit PN: STGs assessed on 10/25/20 and 10/29/20 - Pt has met goal for sit <> stand transfers  and pt has improved his 6MWT distance by >100'. Pt improved TUG score to 8.37 seconds (previously was 14.69 seconds), decr pt's risk of falls. Today's skilled session focused on balance strategies on compliant surfaces and with resisted belt for backwards walking. Incr difficulty with SLS tasks and when changing surfaces. Pt tolerated session well today, will continue to progress towards LTGs.    Personal Factors and Comorbidities Comorbidity 3+    Comorbidities anxiety, arthritis, GERD, prostate cancer, HTN, s/p R TKR 04/2019    Examination-Activity Limitations Locomotion Level;Transfers;Stand;Stairs    Examination-Participation Restrictions Community Activity;Other   playing with grandchildren   Stability/Clinical Decision Making Evolving/Moderate complexity    Rehab Potential Good    PT Frequency 2x / week    PT Duration 6 weeks   plus eval   PT Treatment/Interventions ADLs/Self Care Home Management;Gait training;Functional mobility training;Therapeutic activities;Therapeutic exercise;Balance training;Neuromuscular re-education;DME Instruction;Patient/family education    PT Next Visit Plan PWR moves on compliant surfaces, continue step strategies for balance;  try resisted gait and in lateral/posterior directions, balance on compliant surfaces/changing surfaces/ SLS    Consulted and Agree with Plan of Care Patient  Patient will benefit from skilled therapeutic intervention in order to improve the following deficits and impairments:  Abnormal gait, Difficulty walking, Decreased balance, Decreased mobility, Postural dysfunction, Decreased strength, Impaired flexibility  Visit Diagnosis: Other abnormalities of gait and mobility  Other symptoms and signs involving the nervous system  Abnormal posture     Problem List Patient Active Problem List   Diagnosis Date Noted   S/P TKR (total knee replacement), right 05/05/2019   Osteoarthritis of right knee 05/02/2019   Prostate  cancer ([place]) 03/22/2018   Malignant neoplasm of prostate ([place]) 02/27/2018    Arliss Journey, PT, DPT  11/02/2020, 2:39 PM  [facility] [address] [place], [place], [address] Phone: [phone]   Fax:  [phone]  Name: Emigdio Wildeman MRN: [id number] Date of Birth: 30-May-1950

## 2020-11-02 NOTE — Telephone Encounter (Addendum)
I called the pt's wife/Patient and pt states he started on 4 mg Neupro patch and noticed an increase in lightheadedness, fatigue, and swelling in his legs and ankles.  Patient reports that the 4 mg patch too much and wanted to know if he could decrease back down to the 2 mg patch?  I spoke with Dr. Rexene Alberts verbally on this and received verbal order for the patient to decrease the dosage to the 2 mg Neupro patch of the. Dr. Rexene Alberts recommends the pt try this dosage for 1 to 2 weeks and then call back to let us know how he is doing.    I called patient back and relayed Dr. Guadelupe Sabin recommendations patient was agreeable.  States he does not need any refills of the Neupro patch 2 mg at this time.  And will call back in 1 to 2 weeks to let us know how he is doing. MAR updated with his change.

## 2020-11-04 ENCOUNTER — Other Ambulatory Visit: Payer: Self-pay

## 2020-11-04 ENCOUNTER — Encounter: Payer: Self-pay | Admitting: Physical Therapy

## 2020-11-04 ENCOUNTER — Ambulatory Visit: Payer: PPO | Admitting: Occupational Therapy

## 2020-11-04 ENCOUNTER — Ambulatory Visit: Payer: PPO | Admitting: Physical Therapy

## 2020-11-04 VITALS — BP 138/71 | HR 56

## 2020-11-04 DIAGNOSIS — R278 Other lack of coordination: Secondary | ICD-10-CM

## 2020-11-04 DIAGNOSIS — R29818 Other symptoms and signs involving the nervous system: Secondary | ICD-10-CM

## 2020-11-04 DIAGNOSIS — R293 Abnormal posture: Secondary | ICD-10-CM

## 2020-11-04 DIAGNOSIS — R2689 Other abnormalities of gait and mobility: Secondary | ICD-10-CM

## 2020-11-04 DIAGNOSIS — R471 Dysarthria and anarthria: Secondary | ICD-10-CM | POA: Diagnosis not present

## 2020-11-04 DIAGNOSIS — R29898 Other symptoms and signs involving the musculoskeletal system: Secondary | ICD-10-CM

## 2020-11-04 DIAGNOSIS — R2681 Unsteadiness on feet: Secondary | ICD-10-CM

## 2020-11-04 IMAGING — DX DG CHEST 2V
2 series · 2 of 2 positions shown · non-contrast
Comparison: Single-view of the chest 08/28/2004.

CLINICAL DATA: Preoperative examination.

EXAM:
CHEST - 2 VIEW

[chest pa]
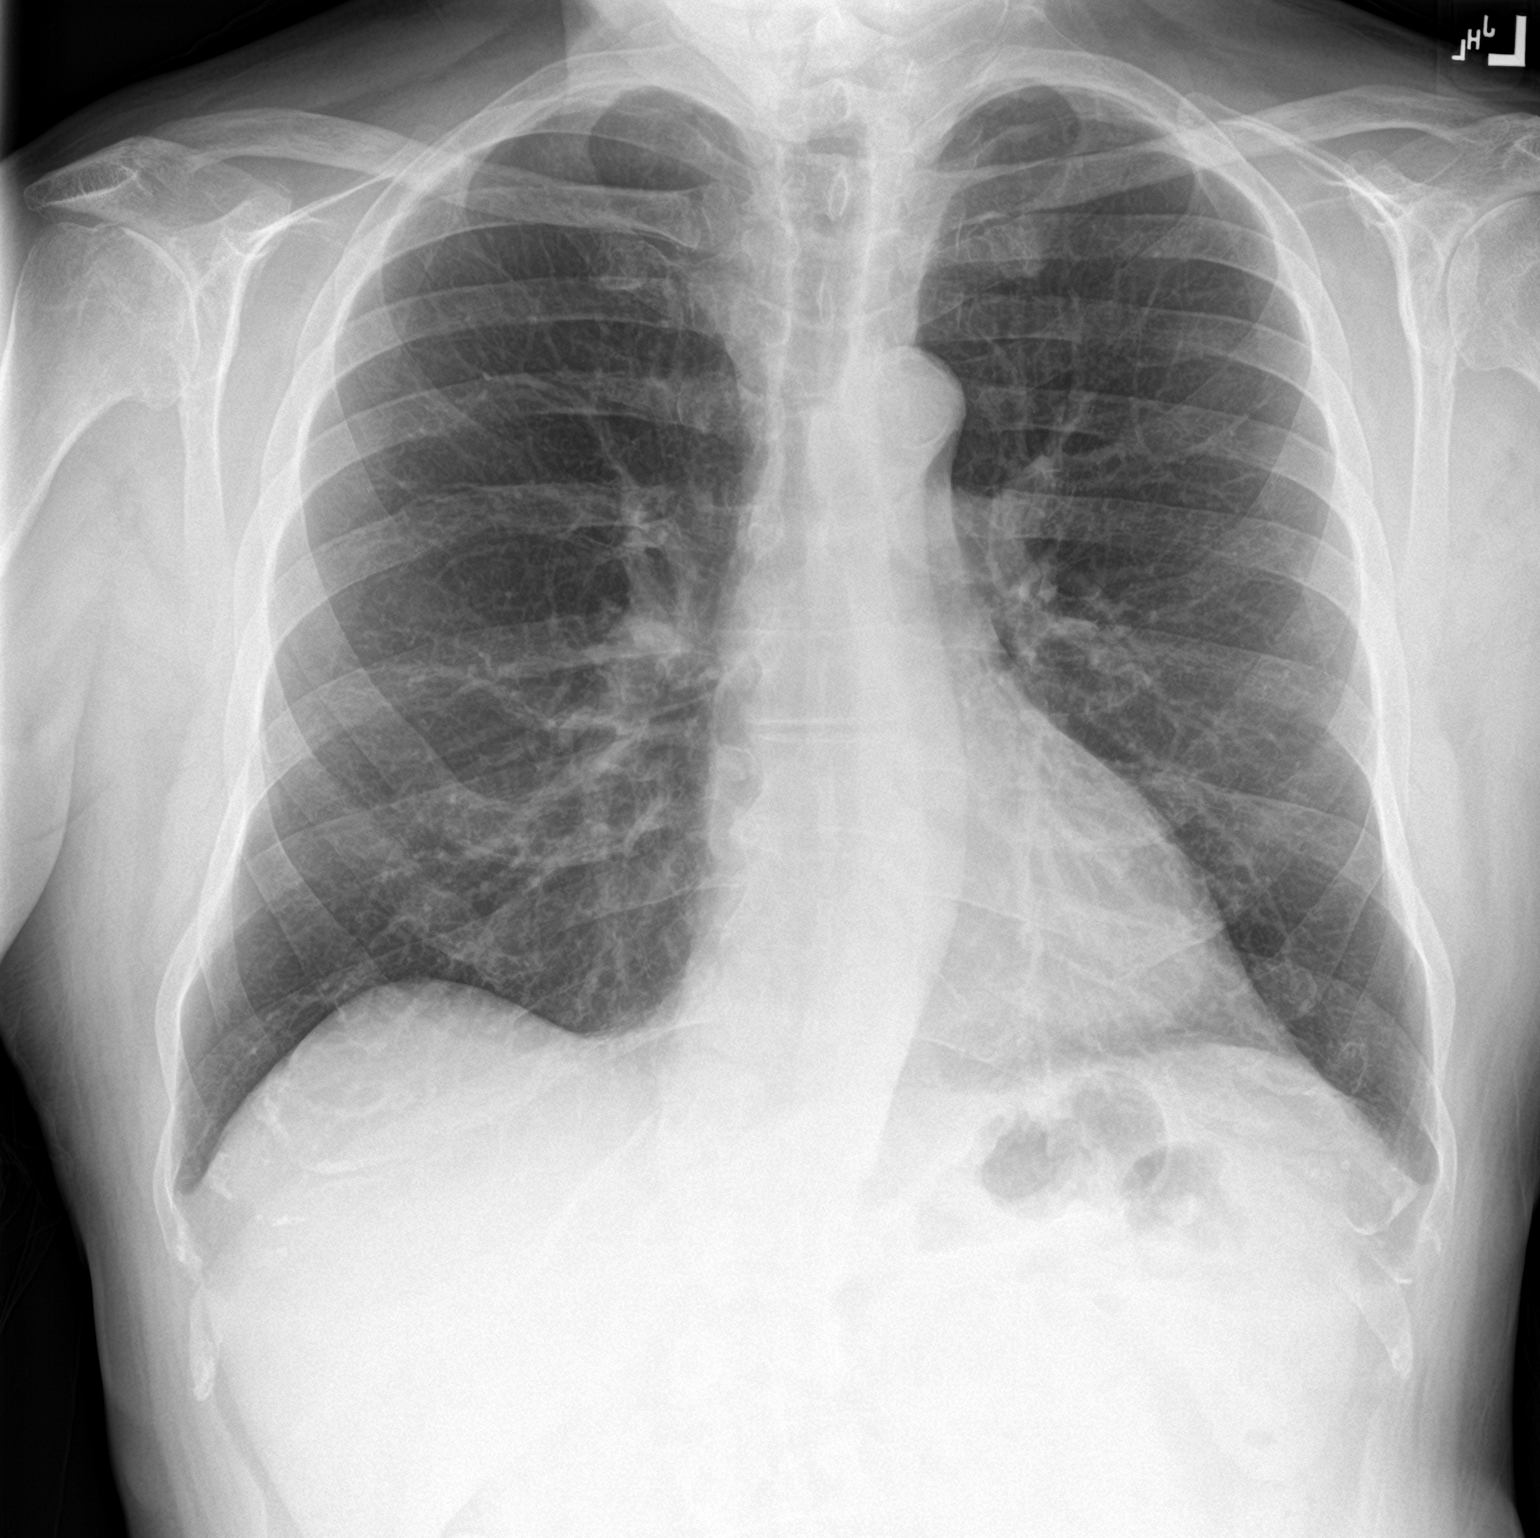

[chest lat]
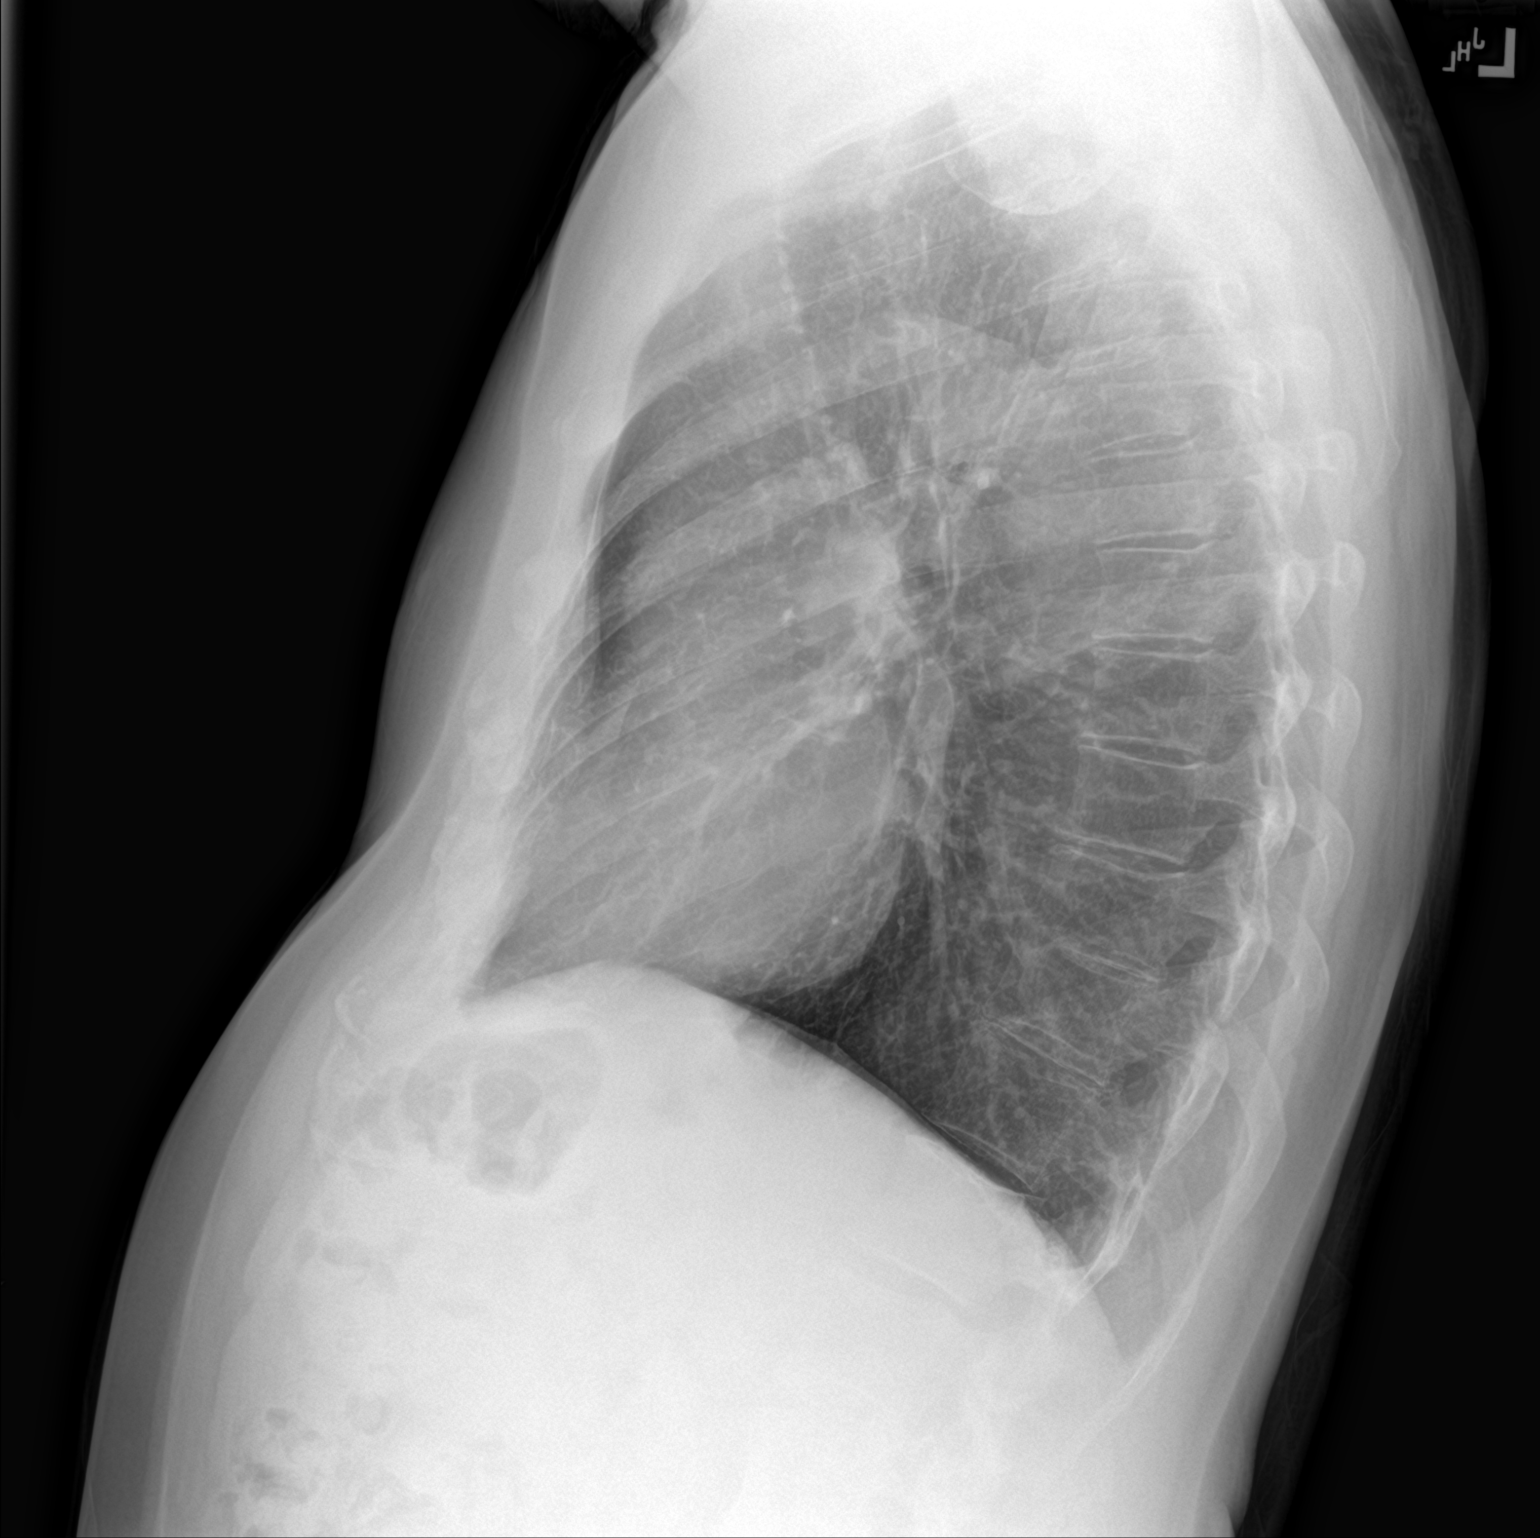

[2 of 2 positions shown; findings below may reference images not displayed]

FINDINGS: Lungs clear. Heart size normal. Aortic atherosclerosis. No
pneumothorax or pleural effusion. Pectus deformity is noted.
IMPRESSION: No acute disease.

Atherosclerosis.

## 2020-11-04 NOTE — Therapy (Signed)
[facility] [address] [place], [place], [address] Phone: [phone]   Fax:  [phone]  Physical Therapy Treatment  Patient Details  Name: Matthew [name] MRN: [id number] Date of Birth: Feb 22, 1951 Referring Provider (PT): Star Age   Encounter Date: 11/04/2020   PT End of Session - 11/04/20 1111     Visit Number 11    Number of Visits 13    Date for PT Re-Evaluation 76/72/09   90 day cert, 6 wk POC; pt wants to wait to schedule all 3 disciplines together   Authorization Type [employer]    PT Start Time 1109    PT Stop Time 1147    PT Time Calculation (min) 38 min    Activity Tolerance Patient tolerated treatment well    Behavior During Therapy [facility] for tasks assessed/performed             Past Medical History:  Diagnosis Date   Anxiety    Arthritis    GERD (gastroesophageal reflux disease)    occ remote history   Hypertension    Parkinson disease ([place])    Prostate cancer ([place])    Right knee pain    questionable meniscal tear   Seasonal allergies     Past Surgical History:  Procedure Laterality Date   COLONOSCOPY     Leg Laceration Repair  Left    Age 70   PELVIC LYMPH NODE DISSECTION Bilateral 03/22/2018   Procedure: PELVIC LYMPH NODE DISSECTION;  Surgeon: Ceasar Mons, MD;  Location: [facility];  Service: Urology;  Laterality: Bilateral;   PROSTATE BIOPSY     ROBOT ASSISTED LAPAROSCOPIC RADICAL PROSTATECTOMY N/A 03/22/2018   Procedure: XI ROBOTIC ASSISTED LAPAROSCOPIC PROSTATECTOMY;  Surgeon: Ceasar Mons, MD;  Location: [facility];  Service: Urology;  Laterality: N/A;   TOTAL KNEE ARTHROPLASTY Right 05/05/2019   Procedure: RIGHT TOTAL KNEE ARTHROPLASTY;  Surgeon: Frederik Pear, MD;  Location: [facility];  Service: Orthopedics;  Laterality: Right;    Vitals:   11/04/20 1115  BP: 138/71  Pulse: (!) 56     Subjective Assessment - 11/04/20 1110     Subjective Been  muddling through with the side effects from the increase of the medication.  Dr. Rexene Alberts said to just go back to the previous dose and the side effects are diminshing.    Pertinent History hx of prostate cancer/removal, incontinence, hx of R TKR 04/2019    Patient Stated Goals Pt's goals are to improve balance and try to avoid my overall slowed mobility.    Currently in Pain? No/denies                               [facility] Adult PT Treatment/Exercise - 11/04/20 1116       Ambulation/Gait   Ambulation/Gait Yes    Ambulation/Gait Assistance 5: Supervision    Ambulation/Gait Assistance Details narrowed BOS and cues provided to widen BOS    Ambulation Distance (Feet) 200 Feet   400; 150; 50 ft x 2   Assistive device None    Gait Pattern Step-through pattern;Decreased arm swing - right;Decreased arm swing - left;Decreased step length - right;Decreased step length - left;Right flexed knee in stance;Left flexed knee in stance;Decreased trunk rotation;Trunk flexed;Narrow base of support    Ambulation Surface Indoor;Level    Pre-Gait Activities Standing with wide BOS with lateral weigthshifting x 5 reps, then with stagger stance wide diagonal A/P weightshifting,  focus on wider BOS to translate to wide BOS with gait.    Gait Comments Pt reports feeling like his walking is "like Cristino Martes."  Discussed recalibrating how he feels with standing and walking, that when he feels like he is exaggerating his wider BOS and it feels wider and bigger than it should, this IS a more NORMAL pattern of walking.  Discussed awareness of this pattern for wider BOS to improve balance with gait.      High Level Balance   High Level Balance Activities Figure 8 turns    High Level Balance Comments Cues for wider BOS with figure-8 and wide walking U-turns      Therapeutic Activites    Therapeutic Activities Other Therapeutic Activities    Other Therapeutic Activities Discussed pt's improving side effect  symtpoms, though he does continue to report BLE swelling; upon palpation, pt has +1 pitting edema bilat lower legs.  Educated pt in making sure to stay well hydrated, avoid excess sodium, continue contact with MD if swelling remains or worsens as MD may need to further assess.                 Balance Exercises - 11/04/20 0001       Balance Exercises: Standing   SLS with Vectors Solid surface;Upper extremity assist 2;Limitations    SLS with Vectors Limitations Hip abduction x 10 reps, each side,focus on coming back to wide BOS midline.  Side step and weight shift, then return to wide BOS midline, 10 reps    Other Standing Exercises Wide BOS lateral weightshifting x 10 reps, stagger stance wide diagonal weightshift x 10 reps               PT Education - 11/04/20 1158     Education Details See TA    Person(s) Educated Patient    Methods Explanation              PT Short Term Goals - 10/29/20 1114       PT SHORT TERM GOAL #1   Title Pt will be indepedent with Parkinson's specific HEP to address balance, transfers, gait for improved overall mobility.  TARGET 10/29/2020 (delayed date due to delayed start-Pt wants OT, PT, speech together)    Time 4    Period Weeks    Status Achieved      PT SHORT TERM GOAL #2   Title Pt will perform at least 8 of 10 reps of sit<>stand transfers from < 18 inch surfaces, with no posterior lean or LOB.    Time 4    Period Weeks    Status Achieved      PT SHORT TERM GOAL #3   Title Pt will improve TUG score to less than or equal to 13.5 sec for decreased fall risk.    Baseline 14.69; 8.37 seconds on 10/29/20    Time 4    Period Weeks    Status Achieved      PT SHORT TERM GOAL #4   Title 6 MWT to be assessed and goal written as appropriate.    Baseline 931' with LTG written on 10/05/20; 1036 ft 10/25/2020    Time 4    Period Weeks    Status Achieved               PT Long Term Goals - 10/25/20 1928       PT LONG TERM GOAL #1    Title Pt will be independent with progression of  HEP for Parkinson's related deficits.  TARGET 11/19/2020(delayed due to delayed start for coordination of schedules)    Time 6    Period Weeks    Status On-going      PT LONG TERM GOAL #2   Title Pt will improve 5x sit<>stand to less than or equal to 10 sec for improved transfer efficiency and safety.    Baseline 11.97 sec    Time 6    Period Weeks    Status On-going      PT LONG TERM GOAL #3   Title Pt will improve MiniBESTest score to at least 25/28 for decreased fall risk.    Baseline 23/28    Time 6    Period Weeks    Status On-going      PT LONG TERM GOAL #4   Title Pt will improve TUG cognitive score to less than or equal to 15 sec for improved dual tasking with gait.    Baseline 15.31 sec    Time 6    Period Weeks    Status On-going      PT LONG TERM GOAL #5   Title Pt will verbalize plans for continued community fitness upon d/c from PT.    Time 6    Period Weeks    Status On-going      PT LONG TERM GOAL #6   Title Pt will improve 6MWT distance by at least 200' from baseline in order to demo improved gait endurance for community distances.    Baseline 931' on 10/05/20; 1036 10/25/20    Time 6    Period Weeks    Status Revised                   Plan - 11/04/20 1159     Clinical Impression Statement Skilled PT session today focused on wide BOS for improved standing and dynamic balance with gait.  Discussed and reinforced "recalibration" to feel like wider stance and wider gait pattern is exaggerated and he demo and verbalizes understanding.  He notes he feels more balanced with gait and turns this way; he will continue to benefit from further practice on this.    Personal Factors and Comorbidities Comorbidity 3+    Comorbidities anxiety, arthritis, GERD, prostate cancer, HTN, s/p R TKR 04/2019    Examination-Activity Limitations Locomotion Level;Transfers;Stand;Stairs    Examination-Participation Restrictions  Community Activity;Other   playing with grandchildren   Stability/Clinical Decision Making Evolving/Moderate complexity    Rehab Potential Good    PT Frequency 2x / week    PT Duration 6 weeks   plus eval   PT Treatment/Interventions ADLs/Self Care Home Management;Gait training;Functional mobility training;Therapeutic activities;Therapeutic exercise;Balance training;Neuromuscular re-education;DME Instruction;Patient/family education    PT Next Visit Plan How did it go with wider BOS in standing and gait; continue to reinforce wider stance and BOS with gait; continue step strategies, compliant surface balance and turns; check LTGs and discuss d/c vs renew next week    Consulted and Agree with Plan of Care Patient             Patient will benefit from skilled therapeutic intervention in order to improve the following deficits and impairments:  Abnormal gait, Difficulty walking, Decreased balance, Decreased mobility, Postural dysfunction, Decreased strength, Impaired flexibility  Visit Diagnosis: Other abnormalities of gait and mobility  Unsteadiness on feet     Problem List Patient Active Problem List   Diagnosis Date Noted   S/P TKR (total knee replacement), right 05/05/2019  Osteoarthritis of right knee 05/02/2019   Prostate cancer ([place]) 03/22/2018   Malignant neoplasm of prostate ([place]) 02/27/2018    Shayonna Ocampo W. 11/04/2020, 12:02 PM Frazier Butt., PT   [facility] [address] [place], [place], [address] Phone: [phone]   Fax:  [phone]  Name: Matthew [name] MRN: [id number] Date of Birth: [birth year]

## 2020-11-04 NOTE — Therapy (Signed)
[facility] [address] [place], [place], [address] Phone: [phone]   Fax:  [phone]  Occupational Therapy Treatment  Patient Details  Name: Matthew [name] MRN: [id number] Date of Birth: [birth year] Referring Provider (OT): Dr. Rexene Alberts   Encounter Date: 11/04/2020   OT End of Session - 11/04/20 1024     Visit Number 11    Number of Visits 17    Date for OT Re-Evaluation 11/18/20    Authorization Type [employer]    Authorization Time Period POC written for 8 weeks, scheduled through 6/29    Authorization - Visit Number 11    Authorization - Number of Visits 20    Progress Note Due on Visit 20    OT Start Time 1020    OT Stop Time 1100    OT Time Calculation (min) 40 min    Activity Tolerance Patient tolerated treatment well    Behavior During Therapy [name] for tasks assessed/performed             Past Medical History:  Diagnosis Date   Anxiety    Arthritis    GERD (gastroesophageal reflux disease)    occ remote history   Hypertension    Parkinson disease ([place])    Prostate cancer ([place])    Right knee pain    questionable meniscal tear   Seasonal allergies     Past Surgical History:  Procedure Laterality Date   COLONOSCOPY     Leg Laceration Repair  Left    Age 15   PELVIC LYMPH NODE DISSECTION Bilateral 03/22/2018   Procedure: PELVIC LYMPH NODE DISSECTION;  Surgeon: Ceasar Mons, MD;  [facility]: [facility];  Service: Urology;  Laterality: Bilateral;   PROSTATE BIOPSY     ROBOT ASSISTED LAPAROSCOPIC RADICAL PROSTATECTOMY N/A 03/22/2018   Procedure: XI ROBOTIC ASSISTED LAPAROSCOPIC PROSTATECTOMY;  Surgeon: Ceasar Mons, MD;  [facility];  Service: Urology;  Laterality: N/A;   TOTAL KNEE ARTHROPLASTY Right 05/05/2019   Procedure: RIGHT TOTAL KNEE ARTHROPLASTY;  Surgeon: Frederik Pear, MD;  [facility];  Service: Orthopedics;  Laterality: Right;    There were no vitals  filed for this visit.   Subjective Assessment - 11/04/20 1022     Subjective  "I'll tell you that those dressing tips really help and I already ordered the shoe funnel."    Pertinent History PMH:  PD, anxiety, arthritis, GERD, prostate cancer, HTN, s/p R TKR 04/2019    Limitations hx of CA, use caution with modalities.    Patient Stated Goals improve ADL performance, balance    Currently in Pain? No/denies              Continued education regarding ADL strategies.  Reviewed eating strategies (perpendicular fork with stabbing food, posture, scooting up to the table, cutting with tip of knife ahead of food), driving strategies (look and R hand position, good grasp on wheel, posture/trunk alignment, and larger/sooner movements for L hand turn due to reported mild difficulty with L turns and drifting some to the L), emphasized importance of head/eye movements and trunk movement with driving and other functional tasks; strategies for donning seatbelt (rotation, head movements, big reach, feet apart).  Emphasized that if pt is having difficulty with a task that he should check posture, eye/trunk movements, move big, and keep feet apart to incr ease, safety, and decr risk of future complications related to PD.          OT Education -  11/04/20 1434     Education Details Strategies for ADLs    Person(s) Educated Patient    Methods Explanation;Demonstration    Comprehension Verbalized understanding              OT Short Term Goals - 10/22/20 1200       OT SHORT TERM GOAL #1   Title I with PD specific HEP    Time 4    Period Weeks    Status On-going   may benefit from reinforcement   Target Date 10/21/20      OT SHORT TERM GOAL #2   Title Pt will verbalize understanding of adapted strategies to maximize safety and I with ADLs/ IADLs .    Time 4    Period Weeks    Status On-going   can benefit from reinforcement     OT SHORT TERM GOAL #3   Title Pt will demonstrate improved  UE funcitonal use as eviedenced by increaseing box/ blocks by 3 blocks bilaterally.    Baseline RUE 46 LUE 42 blocks ,    Time 4    Period Weeks    Status On-going   Partially met 10/19/20:  R-44blocks, L-45blocks (met with LUE, not met with RUE)     OT SHORT TERM GOAL #4   Title Pt will demonstrate increased ease with dressing as evidenced by decreasing PPT#4(don/ doff jacket) to 25 secs or less    Baseline 32.35    Time 4    Period Weeks    Status On-going   27.03     OT SHORT TERM GOAL #5   Title Pt will retrieve a lightweight object from overhead shelf at 120 shoulder flexion with left and right UE's    Time 4    Period Weeks    Status Achieved   10/19/20:  LUE 140* (-5* elbow ext), RUE 130* (-10* elbow ext)              OT Long Term Goals - 09/16/20 1603       OT LONG TERM GOAL #1   Title Pt will verbalize understanding of ways to prevent future PD related complications and PD community resources following review    Time 8    Period Weeks    Status New    Target Date 11/18/20      OT LONG TERM GOAL #2   Title Pt will demonstrate improved fine motor coordination for ADLs as evidenced by decreasing 9 hole peg test score for RUE by 3 secs    Baseline 38    Time 8    Period Weeks    Status New      OT LONG TERM GOAL #3   Title Pt will demonstrate improved ease with dressing as eveidenced by decreasing 3 button/ unbutton time to 30 secs or less    Baseline 34.09    Time 8    Period Weeks    Status Achieved      OT LONG TERM GOAL #4   Title Pt will report pain in right shoulder no greater than 2/10 for ADLs    Time 8    Period Weeks    Status New      OT LONG TERM GOAL #5   Title .    Time --    Period --                   Plan - 11/04/20 1029     Clinical  Impression Statement Pt is progressing towards goals and reports incr ease with dressing using adaptive/large amplitude movement strategies.    OT Occupational Profile and History Detailed  Assessment- Review of Records and additional review of physical, cognitive, psychosocial history related to current functional performance    Occupational performance deficits (Please refer to evaluation for details): ADL's;IADL's;Leisure;Social Participation    Body Structure / Function / Physical Skills ADL;Balance;Mobility;Strength;Tone;UE functional use;Flexibility;FMC;Coordination;Decreased knowledge of precautions;GMC;ROM;Decreased knowledge of use of DME;Dexterity;IADL    OT Frequency 2x / week    OT Duration 8 weeks    OT Treatment/Interventions Self-care/ADL training;Therapeutic exercise;Functional Mobility Training;Balance training;Manual Therapy;Neuromuscular education;Ultrasound;Aquatic Therapy;Energy conservation;Therapeutic activities;DME and/or AE instruction;Paraffin;Cryotherapy;Fluidtherapy;Gait Training;Moist Heat;Contrast Bath;Passive range of motion;Patient/family education    Plan continue with ADL strategies, functional reaching with elbow extension and cueing for posture/positioning, monitor R shoulder pain, coordination/timing    OT Home Exercise Plan Supine PWR! up and rock, closed-chain shoulder flex in supine    Consulted and Agree with Plan of Care Patient             Patient will benefit from skilled therapeutic intervention in order to improve the following deficits and impairments:   Body Structure / Function / Physical Skills: ADL, Balance, Mobility, Strength, Tone, UE functional use, Flexibility, FMC, Coordination, Decreased knowledge of precautions, GMC, ROM, Decreased knowledge of use of DME, Dexterity, IADL       Visit Diagnosis: Other symptoms and signs involving the nervous system  Abnormal posture  Other lack of coordination  Other symptoms and signs involving the musculoskeletal system  Unsteadiness on feet  Other abnormalities of gait and mobility    Problem List Patient Active Problem List   Diagnosis Date Noted   S/P TKR (total knee  replacement), right 05/05/2019   Osteoarthritis of right knee 05/02/2019   Prostate cancer ([place]) 03/22/2018   Malignant neoplasm of prostate ([place]) 02/27/2018    [facility] 11/04/2020, 2:42 PM  [facility] [address] [place], [place], [address] Phone: [phone]   Fax:  [phone]  Name: Matthew [name] MRN: [id number] Date of Birth: [birth year]  Vianne Bulls, OTR/L [facility] [address] [place], [place]  [address] [phone] phone [phone] 11/04/20 2:42 PM

## 2020-11-05 DIAGNOSIS — C61 Malignant neoplasm of prostate: Secondary | ICD-10-CM | POA: Diagnosis not present

## 2020-11-09 ENCOUNTER — Ambulatory Visit: Payer: PPO | Admitting: Occupational Therapy

## 2020-11-09 ENCOUNTER — Ambulatory Visit: Payer: PPO | Admitting: Physical Therapy

## 2020-11-09 ENCOUNTER — Ambulatory Visit: Payer: PPO

## 2020-11-11 ENCOUNTER — Ambulatory Visit: Payer: PPO | Admitting: Occupational Therapy

## 2020-11-11 ENCOUNTER — Ambulatory Visit: Payer: PPO | Admitting: Physical Therapy

## 2020-11-11 ENCOUNTER — Ambulatory Visit: Payer: PPO

## 2020-11-11 DIAGNOSIS — N3946 Mixed incontinence: Secondary | ICD-10-CM | POA: Diagnosis not present

## 2020-11-11 DIAGNOSIS — C61 Malignant neoplasm of prostate: Secondary | ICD-10-CM | POA: Diagnosis not present

## 2020-11-15 ENCOUNTER — Ambulatory Visit: Payer: PPO

## 2020-11-15 ENCOUNTER — Ambulatory Visit: Payer: PPO | Admitting: Occupational Therapy

## 2020-11-15 ENCOUNTER — Other Ambulatory Visit: Payer: Self-pay

## 2020-11-15 DIAGNOSIS — R29898 Other symptoms and signs involving the musculoskeletal system: Secondary | ICD-10-CM

## 2020-11-15 DIAGNOSIS — R293 Abnormal posture: Secondary | ICD-10-CM

## 2020-11-15 DIAGNOSIS — R1310 Dysphagia, unspecified: Secondary | ICD-10-CM

## 2020-11-15 DIAGNOSIS — R278 Other lack of coordination: Secondary | ICD-10-CM

## 2020-11-15 DIAGNOSIS — R471 Dysarthria and anarthria: Secondary | ICD-10-CM

## 2020-11-15 DIAGNOSIS — R2681 Unsteadiness on feet: Secondary | ICD-10-CM

## 2020-11-15 DIAGNOSIS — R29818 Other symptoms and signs involving the nervous system: Secondary | ICD-10-CM

## 2020-11-15 DIAGNOSIS — R2689 Other abnormalities of gait and mobility: Secondary | ICD-10-CM

## 2020-11-15 NOTE — Patient Instructions (Signed)

## 2020-11-16 NOTE — Therapy (Signed)
[facility] [address] [place], [place], [address] Phone: [phone]   Fax:  [phone]  Occupational Therapy Treatment  Patient Details  Name: Matthew [name] MRN: [id number] Date of Birth: 1951-01-25 Referring Provider (OT): Dr. Rexene Alberts   Encounter Date: 11/15/2020   OT End of Session - 11/16/20 0921     Authorization Type [employer]    Authorization Time Period POC written for 8 weeks, scheduled through 6/29    Authorization - Visit Number 12    Authorization - Number of Visits 20             Past Medical History:  Diagnosis Date   Anxiety    Arthritis    GERD (gastroesophageal reflux disease)    occ remote history   Hypertension    Parkinson disease ([place])    Prostate cancer ([place])    Right knee pain    questionable meniscal tear   Seasonal allergies     Past Surgical History:  Procedure Laterality Date   COLONOSCOPY     Leg Laceration Repair  Left    Age 17   PELVIC LYMPH NODE DISSECTION Bilateral 03/22/2018   Procedure: PELVIC LYMPH NODE DISSECTION;  Surgeon: Ceasar Mons, MD;  Location: [facility];  Service: Urology;  Laterality: Bilateral;   PROSTATE BIOPSY     ROBOT ASSISTED LAPAROSCOPIC RADICAL PROSTATECTOMY N/A 03/22/2018   Procedure: XI ROBOTIC ASSISTED LAPAROSCOPIC PROSTATECTOMY;  Surgeon: Ceasar Mons, MD;  Location: [facility];  Service: Urology;  Laterality: N/A;   TOTAL KNEE ARTHROPLASTY Right 05/05/2019   Procedure: RIGHT TOTAL KNEE ARTHROPLASTY;  Surgeon: Frederik Pear, MD;  Location: [facility];  Service: Orthopedics;  Laterality: Right;    There were no vitals filed for this visit.   Subjective Assessment - 11/16/20 0911     Subjective  Denies pain    Pertinent History PMH:  PD, anxiety, arthritis, GERD, prostate cancer, HTN, s/p R TKR 04/2019    Limitations hx of CA, use caution with modalities.    Patient Stated Goals improve ADL performance, balance     Currently in Pain? No/denies                Treatment: reviewed strategies for fastening buttons, min v.c for big movements Stacking blocks with bilateral UE's for cognitive component and bimanual activity Checked 9 hole peg test score see goal.  Dealing cards with left and right UE's for increased fine motor coordination, min v.c for amplitude                  OT Education - 11/15/20 1037     Education Details Ways to prevent future complications, community resources, PWR! moves basic 4 modified quadraped, x 10 reps each    Person(s) Educated Patient    Methods Explanation;Handout;Demonstration;Verbal cues    Comprehension Verbalized understanding;Returned demonstration;Verbal cues required              OT Short Term Goals - 11/15/20 1024       OT SHORT TERM GOAL #1   Title I with PD specific HEP    Time 4    Period Weeks    Status Achieved    Target Date 10/21/20      OT SHORT TERM GOAL #2   Title Pt will verbalize understanding of adapted strategies to maximize safety and I with ADLs/ IADLs .    Time 4    Period Weeks    Status On-going   can  benefit from reinforcement     OT SHORT TERM GOAL #3   Title Pt will demonstrate improved UE funcitonal use as eviedenced by increaseing box/ blocks by 3 blocks bilaterally.    Baseline RUE 46 LUE 42 blocks ,    Time 4    Period Weeks    Status On-going   Partially met 10/19/20:  R-44blocks, L-45blocks (met with LUE, not met with RUE)     OT SHORT TERM GOAL #4   Title Pt will demonstrate increased ease with dressing as evidenced by decreasing PPT#4(don/ doff jacket) to 25 secs or less    Baseline 32.35    Time 4    Period Weeks    Status On-going   27.03     OT SHORT TERM GOAL #5   Title Pt will retrieve a lightweight object from overhead shelf at 120 shoulder flexion with left and right UE's    Time 4    Period Weeks    Status Achieved   10/19/20:  LUE 140* (-5* elbow ext), RUE 130* (-10* elbow ext)               OT Long Term Goals - 11/15/20 1055       OT LONG TERM GOAL #1   Title Pt will verbalize understanding of ways to prevent future PD related complications and PD community resources following review    Time 8    Period Weeks    Status Achieved      OT LONG TERM GOAL #2   Title Pt will demonstrate improved fine motor coordination for ADLs as evidenced by decreasing 9 hole peg test score for RUE by 3 secs    Baseline 38    Time 8    Period Weeks    Status Achieved   30.78 secs     OT LONG TERM GOAL #3   Title Pt will demonstrate improved ease with dressing as eveidenced by decreasing 3 button/ unbutton time to 30 secs or less    Baseline 34.09    Time 8    Period Weeks    Status Achieved      OT LONG TERM GOAL #4   Title Pt will report pain in right shoulder no greater than 2/10 for ADLs    Time 8    Period Weeks    Status Achieved      OT LONG TERM GOAL #5   Title .                   Plan - 11/16/20 0913     Clinical Impression Statement Pt is progressing towards goals . He demonstrates improved fine motor coordination and understanding of adapted strategies for ADLS.    OT Occupational Profile and History Detailed Assessment- Review of Records and additional review of physical, cognitive, psychosocial history related to current functional performance    Occupational performance deficits (Please refer to evaluation for details): ADL's;IADL's;Leisure;Social Participation    Body Structure / Function / Physical Skills ADL;Balance;Mobility;Strength;Tone;UE functional use;Flexibility;FMC;Coordination;Decreased knowledge of precautions;GMC;ROM;Decreased knowledge of use of DME;Dexterity;IADL    OT Frequency 2x / week    OT Duration 8 weeks    OT Treatment/Interventions Self-care/ADL training;Therapeutic exercise;Functional Mobility Training;Balance training;Manual Therapy;Neuromuscular education;Ultrasound;Aquatic Therapy;Energy  conservation;Therapeutic activities;DME and/or AE instruction;Paraffin;Cryotherapy;Fluidtherapy;Gait Training;Moist Heat;Contrast Bath;Passive range of motion;Patient/family education    Plan anticipate d/c next visit, check goals    OT Home Exercise Plan Supine PWR! up and rock, closed-chain shoulder flex in supine  Consulted and Agree with Plan of Care Patient             Patient will benefit from skilled therapeutic intervention in order to improve the following deficits and impairments:   Body Structure / Function / Physical Skills: ADL, Balance, Mobility, Strength, Tone, UE functional use, Flexibility, FMC, Coordination, Decreased knowledge of precautions, GMC, ROM, Decreased knowledge of use of DME, Dexterity, IADL       Visit Diagnosis: Abnormal posture  Other symptoms and signs involving the nervous system  Other lack of coordination  Other symptoms and signs involving the musculoskeletal system  Other abnormalities of gait and mobility  Unsteadiness on feet    Problem List Patient Active Problem List   Diagnosis Date Noted   S/P TKR (total knee replacement), right 05/05/2019   Osteoarthritis of right knee 05/02/2019   Prostate cancer ([place]) 03/22/2018   Malignant neoplasm of prostate ([place]) 02/27/2018    Michaelia Beilfuss 11/16/2020, 9:27 AM  [facility] [address] [place], [place], [address] Phone: [phone]   Fax:  [phone]  Name: Matthew [name] MRN: [id number] Date of Birth: [birth year]

## 2020-11-16 NOTE — Therapy (Signed)
[facility] [address] [place], [place], [address] Phone: [phone]   Fax:  [phone]  Speech Language Pathology Treatment  Patient Details  Name: Matthew [name] MRN: [id number] Date of Birth: 1950-11-25 Referring Provider (SLP): Dr. Rexene Alberts   Encounter Date: 11/15/2020   End of Session - 11/15/20 1133     Visit Number 9    Number of Visits 17    Date for SLP Re-Evaluation 12/15/20    SLP Start Time 1105    SLP Stop Time  1145    SLP Time Calculation (min) 40 min    Activity Tolerance Patient tolerated treatment well             Past Medical History:  Diagnosis Date   Anxiety    Arthritis    GERD (gastroesophageal reflux disease)    occ remote history   Hypertension    Parkinson disease ([place])    Prostate cancer ([employer])    Right knee pain    questionable meniscal tear   Seasonal allergies     Past Surgical History:  Procedure Laterality Date   COLONOSCOPY     Leg Laceration Repair  Left    Age 66   PELVIC LYMPH NODE DISSECTION Bilateral 03/22/2018   Procedure: PELVIC LYMPH NODE DISSECTION;  Surgeon: Ceasar Mons, MD;  [facility];  Service: Urology;  Laterality: Bilateral;   PROSTATE BIOPSY     ROBOT ASSISTED LAPAROSCOPIC RADICAL PROSTATECTOMY N/A 03/22/2018   Procedure: XI ROBOTIC ASSISTED LAPAROSCOPIC PROSTATECTOMY;  Surgeon: Ceasar Mons, MD;  [facility];  Service: Urology;  Laterality: N/A;   TOTAL KNEE ARTHROPLASTY Right 05/05/2019   Procedure: RIGHT TOTAL KNEE ARTHROPLASTY;  Surgeon: Frederik Pear, MD;  [facility];  Service: Orthopedics;  Laterality: Right;    There were no vitals filed for this visit.   Subjective Assessment - 11/15/20 1122     Subjective Pt states the past week has not been good for ihs consistency with homework.    Currently in Pain? No/denies                   ADULT SLP TREATMENT - 11/16/20 0001       General  Information   Behavior/Cognition Alert;Cooperative;Pleasant mood      Treatment Provided   Treatment provided Cognitive-Linquistic      Cognitive-Linquistic Treatment   Treatment focused on Dysarthria    Skilled Treatment Loud /a/ today completed mid 80s dB with min cues initially from SLP for more effrot (8.5 instead of "6-7"), and eliminating laryngeal push at very end. Pt worked with sentence responses today and req'd rare min A for loudness - SLP used this example to impress upon pt how important it is to keep up practice after d/c. (pt was at WNL volume for almost 20 minutes and was aware of below WNL volume at last session 10-29-20). SLP provided pt homework to practice louder speech.      Assessment / Recommendations / Plan   Plan Continue with current plan of care      Progression Toward Goals   Progression toward goals Not progressing toward goals (comment)   decr'd practice over the last 3 weeks             SLP Education - 11/16/20 1010     Education Details importance of practice and loud /a/ and everyday sentences    Person(s) Educated Patient    Methods Explanation;Demonstration    Comprehension  Verbalized understanding              SLP Short Term Goals - 10/29/20 1141       SLP SHORT TERM GOAL #1   Title pt will produce /a/ with average upper 80s dB at 30 cm over 3 sessions    Baseline 10-14-20, 10-29-20    Status Partially Met      SLP SHORT TERM GOAL #2   Title pt will demo abdominal breathing with sentences 90% accuracy x 3 sessions    Baseline 10-22-20, 10-25-20    Status Achieved      SLP SHORT TERM GOAL #3   Title pt will generate 8 minutes simple conversation with low 70s dB average in 3 sessions    Baseline 10-12-20, 10-22-20, 10-25-20    Status Achieved      SLP SHORT TERM GOAL #4   Title pt will complete clinical swallowing assessment if indicated.    Status Achieved      SLP SHORT TERM GOAL #5   Title pt will undergo objective swallow assessment  (MBSS or FEES) if clinically indicated    Status Deferred              SLP Long Term Goals - 11/16/20 1012       SLP LONG TERM GOAL #1   Title pt will use abdominal breathing >80% of the time during 10 minute simple conversation in 3 sessions    Baseline 10-22-20, 10-25-20    Period --   or 17 total visits, for all LTGs   Status Achieved      SLP LONG TERM GOAL #2   Title pt will maintain WNL vocal quality/intensity in 10 minute simple conversation outside [place] room x 3 sessions    Baseline 10-25-20, 10-29-20    Time 4    Period Weeks    Status On-going      SLP LONG TERM GOAL #3   Title pt will report fewer requests to repeat himself than prior to ST    Time 4    Period Weeks    Status On-going      SLP LONG TERM GOAL #4   Title pt will tell SLP 3 overt s/s difficulty oropharyngeal dysphagia with modified independence    Time 4    Period Weeks    Status On-going      SLP LONG TERM GOAL #5   Title pt will report less frequent drooling than prior to eval    Time 4    Period Weeks    Status On-going              Plan - 11/16/20 1011     Clinical Impression Statement Patient presents with 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 SLP to cont monitor swallow function and pt may still require objective swallow eval at some point. I recommend skilled ST to address communication and if necessary, dysphagia, in order to improve pt's communication and quality of life.    Speech Therapy Frequency 2x / week    Duration 8 weeks   or 17 visits   Treatment/Interventions Aspiration precaution training;Language facilitation;Environmental controls;Cueing hierarchy;SLP instruction and feedback;Compensatory techniques;Functional tasks;Compensatory strategies;Patient/family education;Diet toleration management by SLP;Other (comment)   MBS if warranted   Potential to Achieve Goals Good    Consulted and Agree with Plan of  Care Patient  Patient will benefit from skilled therapeutic intervention in order to improve the following deficits and impairments:   Dysarthria and anarthria  Dysphagia, unspecified type    Problem List Patient Active Problem List   Diagnosis Date Noted   S/P TKR (total knee replacement), right 05/05/2019   Osteoarthritis of right knee 05/02/2019   Prostate cancer ([place]) 03/22/2018   Malignant neoplasm of prostate ([place]) 02/27/2018    [facility] ,[place], [place]  11/16/2020, 10:13 AM  [facility] [address] [place], [place], [address] Phone: [phone]   Fax:  [phone]   Name: Matthew [name] MRN: [id number] Date of Birth: May 27, 1950

## 2020-11-17 ENCOUNTER — Ambulatory Visit: Payer: PPO

## 2020-11-17 ENCOUNTER — Other Ambulatory Visit: Payer: Self-pay

## 2020-11-17 ENCOUNTER — Ambulatory Visit: Payer: PPO | Admitting: Occupational Therapy

## 2020-11-17 DIAGNOSIS — R29818 Other symptoms and signs involving the nervous system: Secondary | ICD-10-CM

## 2020-11-17 DIAGNOSIS — R471 Dysarthria and anarthria: Secondary | ICD-10-CM

## 2020-11-17 DIAGNOSIS — R278 Other lack of coordination: Secondary | ICD-10-CM

## 2020-11-17 DIAGNOSIS — R1310 Dysphagia, unspecified: Secondary | ICD-10-CM

## 2020-11-17 DIAGNOSIS — I10 Essential (primary) hypertension: Secondary | ICD-10-CM | POA: Diagnosis not present

## 2020-11-17 DIAGNOSIS — R29898 Other symptoms and signs involving the musculoskeletal system: Secondary | ICD-10-CM

## 2020-11-17 NOTE — Therapy (Signed)
[facility] [address] [place], [place], [address] Phone: [phone]   Fax:  [phone]  Speech Language Pathology Treatment/Medicare progress note  Patient Details  Name: Matthew [name] MRN: [id number] Date of Birth: 10-29-1950 Referring Provider (SLP): Dr. Rexene Alberts   Encounter Date: 11/17/2020   End of Session - 11/17/20 1233     Visit Number 10    Number of Visits 17    Date for SLP Re-Evaluation 12/15/20    SLP Start Time 1105    SLP Stop Time  1145    SLP Time Calculation (min) 40 min    Activity Tolerance Patient tolerated treatment well             Past Medical History:  Diagnosis Date   Anxiety    Arthritis    GERD (gastroesophageal reflux disease)    occ remote history   Hypertension    Parkinson disease ([place])    Prostate cancer ([place])    Right knee pain    questionable meniscal tear   Seasonal allergies     Past Surgical History:  Procedure Laterality Date   COLONOSCOPY     Leg Laceration Repair  Left    Age 70   PELVIC LYMPH NODE DISSECTION Bilateral 03/22/2018   Procedure: PELVIC LYMPH NODE DISSECTION;  Surgeon: Ceasar Mons, MD;  [facility];  Service: Urology;  Laterality: Bilateral;   PROSTATE BIOPSY     ROBOT ASSISTED LAPAROSCOPIC RADICAL PROSTATECTOMY N/A 03/22/2018   Procedure: XI ROBOTIC ASSISTED LAPAROSCOPIC PROSTATECTOMY;  Surgeon: Ceasar Mons, MD;  [facility];  Service: Urology;  Laterality: N/A;   TOTAL KNEE ARTHROPLASTY Right 05/05/2019   Procedure: RIGHT TOTAL KNEE ARTHROPLASTY;  Surgeon: Frederik Pear, MD;  [facility];  Service: Orthopedics;  Laterality: Right;    There were no vitals filed for this visit.  Speech Therapy Progress Note  Dates of Reporting Period: 09-16-20 to present  Subjective Statement: See below  Objective: See below  Goal Update: See below  Plan: Pt will cont to be seen for skilled ST.  Reason  Skilled Services are Required: Pt is still mastering carryover into conversational speech outside [place] room.    Subjective Assessment - 11/17/20 1109     Subjective "I really think Monday was an anomaly."    Currently in Pain? No/denies                   ADULT SLP TREATMENT - 11/17/20 1110       General Information   Behavior/Cognition Alert;Cooperative;Pleasant mood      Treatment Provided   Treatment provided Cognitive-Linquistic      Cognitive-Linquistic Treatment   Treatment focused on Dysarthria    Skilled Treatment "My wife didn't have me repeat at all yesterday." Today in mod complex conversation of 23 minutes pt decr'd volume for 30-45 seconds x4, but incr'd volume back to WNL independently. This "lag time" below WNL volume was more than in previous sessions but pt was independent in attaining WNL volume each time. Outdoors on a walk for 6 minutes pt had 100% intelligibility and walked near min-mod noisy road/traffic noise for half of that time.      Assessment / Recommendations / Plan   Plan Continue with current plan of care      Progression Toward Goals   Progression toward goals Progressing toward goals                SLP Short Term  Goals - 10/29/20 1141       SLP SHORT TERM GOAL #1   Title pt will produce /a/ with average upper 80s dB at 30 cm over 3 sessions    Baseline 10-14-20, 10-29-20    Status Partially Met      SLP SHORT TERM GOAL #2   Title pt will demo abdominal breathing with sentences 90% accuracy x 3 sessions    Baseline 10-22-20, 10-25-20    Status Achieved      SLP SHORT TERM GOAL #3   Title pt will generate 8 minutes simple conversation with low 70s dB average in 3 sessions    Baseline 10-12-20, 10-22-20, 10-25-20    Status Achieved      SLP SHORT TERM GOAL #4   Title pt will complete clinical swallowing assessment if indicated.    Status Achieved      SLP SHORT TERM GOAL #5   Title pt will undergo objective swallow assessment (MBSS  or FEES) if clinically indicated    Status Deferred              SLP Long Term Goals - 11/17/20 1235       SLP LONG TERM GOAL #1   Title pt will use abdominal breathing >80% of the time during 10 minute simple conversation in 3 sessions    Baseline 10-22-20, 10-25-20    Period --   or 17 total visits, for all LTGs   Status Achieved      SLP LONG TERM GOAL #2   Title pt will maintain WNL vocal quality/intensity in 10 minute simple conversation outside [place] room x 3 sessions    Baseline 10-25-20, 10-29-20    Status Achieved      SLP LONG TERM GOAL #3   Title pt will report fewer requests to repeat himself than prior to ST    Time 4    Period Weeks    Status On-going      SLP LONG TERM GOAL #4   Title pt will tell SLP 3 overt s/s difficulty oropharyngeal dysphagia with modified independence    Time 4    Period Weeks    Status On-going      SLP LONG TERM GOAL #5   Title pt will report less frequent drooling than prior to eval    Time 4    Period Weeks    Status On-going      Additional Long Term Goals   Additional Long Term Goals Yes      SLP LONG TERM GOAL #6   Title pt will produce WNL volume in 20 minutes mod complex conversation in 3 sessions    Baseline 11-17-20    Time 4    Period Weeks    Status New              Plan - 11/17/20 1234     Clinical Impression Statement Patient presents with 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 Pt's WNL volume is again beginning to be carried over more and more into spontaneous conversation during the session. Pt appears to be "back on track" with loudness monitoring/production. SLP to cont monitor swallow function and pt may still require objective swallow eval at some point. I recommend skilled ST to address communication and if necessary, dysphagia, in order to improve pt's communication and quality of life.    Speech Therapy Frequency 2x / week    Duration 8  weeks  or 17 visits   Treatment/Interventions Aspiration precaution training;Language facilitation;Environmental controls;Cueing hierarchy;SLP instruction and feedback;Compensatory techniques;Functional tasks;Compensatory strategies;Patient/family education;Diet toleration management by SLP;Other (comment)   MBS if warranted   Potential to Achieve Goals Good    Consulted and Agree with Plan of Care Patient             Patient will benefit from skilled therapeutic intervention in order to improve the following deficits and impairments:   Dysarthria and anarthria  Dysphagia, unspecified type    Problem List Patient Active Problem List   Diagnosis Date Noted   S/P TKR (total knee replacement), right 05/05/2019   Osteoarthritis of right knee 05/02/2019   Prostate cancer ([place]) 03/22/2018   Malignant neoplasm of prostate ([place]) 02/27/2018    [facility] ,[name], [place]   11/17/2020, 12:37 PM  [facility] [address] [place], [place], [address] Phone: [phone]   Fax:  [phone]   Name: Matthew [name] MRN: [id number] Date of Birth: 1951-03-08

## 2020-11-17 NOTE — Therapy (Signed)
[facility] [address] [place], [place], [address] Phone: [phone]   Fax:  [phone]  Occupational Therapy Treatment  Patient Details  Name: Matthew [name] MRN: [id number] Date of Birth: Jun 18, 1950 Referring Provider (OT): Dr. Rexene Alberts   Encounter Date: 11/17/2020   OT End of Session - 11/17/20 1155     Visit Number 13    Number of Visits 17    Date for OT Re-Evaluation 11/18/20    Authorization Type [employer]    Authorization - Visit Number 67    Authorization - Number of Visits 20    Progress Note Due on Visit --    OT Start Time 1148    OT Stop Time 1228    OT Time Calculation (min) 40 min             Past Medical History:  Diagnosis Date   Anxiety    Arthritis    GERD (gastroesophageal reflux disease)    occ remote history   Hypertension    Parkinson disease ([place])    Prostate cancer ([place])    Right knee pain    questionable meniscal tear   Seasonal allergies     Past Surgical History:  Procedure Laterality Date   COLONOSCOPY     Leg Laceration Repair  Left    Age 42   PELVIC LYMPH NODE DISSECTION Bilateral 03/22/2018   Procedure: PELVIC LYMPH NODE DISSECTION;  Surgeon: Ceasar Mons, MD;  Location: [facility];  Service: Urology;  Laterality: Bilateral;   PROSTATE BIOPSY     ROBOT ASSISTED LAPAROSCOPIC RADICAL PROSTATECTOMY N/A 03/22/2018   Procedure: XI ROBOTIC ASSISTED LAPAROSCOPIC PROSTATECTOMY;  Surgeon: Ceasar Mons, MD;  Location: [facility];  Service: Urology;  Laterality: N/A;   TOTAL KNEE ARTHROPLASTY Right 05/05/2019   Procedure: RIGHT TOTAL KNEE ARTHROPLASTY;  Surgeon: Frederik Pear, MD;  [facility];  Service: Orthopedics;  Laterality: Right;    There were no vitals filed for this visit.   Subjective Assessment - 11/17/20 1150     Subjective  Pt agrees with plans for d/c and screens in 6 mons    Pertinent History PMH:  PD, anxiety, arthritis, GERD,  prostate cancer, HTN, s/p R TKR 04/2019    Limitations hx of CA, use caution with modalities.    Patient Stated Goals improve ADL performance, balance    Currently in Pain? No/denies                Treatment: Dynamic steps and reach to ss scarves to targets with left and right UE's, min v.c for amplitude Discussed strategies for getting items out of wallet with practice.                  OT Education - 11/17/20 1244     Education Details PWR! moves modified quadraped 10 reps each, reveiwed big movement strategies for donning and doffing a jacket, min v.c and demonstration for amplitude, checked progress towards goals and discussed with pt. Pt agrees with PD screens in 6 mons    Person(s) Educated Patient    Methods Explanation;Handout;Demonstration;Verbal cues    Comprehension Verbalized understanding;Returned demonstration;Verbal cues required              OT Short Term Goals - 11/17/20 1214       OT SHORT TERM GOAL #1   Title I with PD specific HEP    Time 4    Period Weeks    Status Achieved  Target Date 10/21/20      OT SHORT TERM GOAL #2   Title Pt will verbalize understanding of adapted strategies to maximize safety and I with ADLs/ IADLs .    Time 4    Period Weeks    Status Achieved      OT SHORT TERM GOAL #3   Title Pt will demonstrate improved UE funcitonal use as eviedenced by increaseing box/ blocks by 3 blocks bilaterally.    Baseline RUE 46 LUE 42 blocks ,    Time 4    Period Weeks    Status Achieved   RUE 50 LUE49     OT SHORT TERM GOAL #4   Title Pt will demonstrate increased ease with dressing as evidenced by decreasing PPT#4(don/ doff jacket) to 25 secs or less    Baseline 32.35    Time 4    Period Weeks    Status Not Met   37.56, 34.16     OT SHORT TERM GOAL #5   Title Pt will retrieve a lightweight object from overhead shelf at 120 shoulder flexion with left and right UE's    Time 4    Period Weeks    Status Achieved    10/19/20:  LUE 140* (-5* elbow ext), RUE 130* (-10* elbow ext)              OT Long Term Goals - 11/15/20 1055       OT LONG TERM GOAL #1   Title Pt will verbalize understanding of ways to prevent future PD related complications and PD community resources following review    Time 8    Period Weeks    Status Achieved      OT LONG TERM GOAL #2   Title Pt will demonstrate improved fine motor coordination for ADLs as evidenced by decreasing 9 hole peg test score for RUE by 3 secs    Baseline 38    Time 8    Period Weeks    Status Achieved   30.78 secs     OT LONG TERM GOAL #3   Title Pt will demonstrate improved ease with dressing as eveidenced by decreasing 3 button/ unbutton time to 30 secs or less    Baseline 34.09    Time 8    Period Weeks    Status Achieved      OT LONG TERM GOAL #4   Title Pt will report pain in right shoulder no greater than 2/10 for ADLs    Time 8    Period Weeks    Status Achieved      OT LONG TERM GOAL #5   Title .                   Plan - 11/17/20 1249     Clinical Impression Statement Pt demonstrates good overall progress. He achieved 4/5 short term goals and all long term goals. Pt agrees with plans for d/c.    OT Occupational Profile and History Detailed Assessment- Review of Records and additional review of physical, cognitive, psychosocial history related to current functional performance    Occupational performance deficits (Please refer to evaluation for details): ADL's;IADL's;Leisure;Social Participation    Body Structure / Function / Physical Skills ADL;Balance;Mobility;Strength;Tone;UE functional use;Flexibility;FMC;Coordination;Decreased knowledge of precautions;GMC;ROM;Decreased knowledge of use of DME;Dexterity;IADL    OT Frequency 2x / week    OT Duration 8 weeks    OT Treatment/Interventions Self-care/ADL training;Therapeutic exercise;Functional Mobility Training;Balance training;Manual Therapy;Neuromuscular  education;Ultrasound;Aquatic Therapy;Energy conservation;Therapeutic  activities;DME and/or AE instruction;Paraffin;Cryotherapy;Fluidtherapy;Gait Training;Moist Heat;Contrast Bath;Passive range of motion;Patient/family education    Plan d/c OT, PD screens in 6 months    OT Home Exercise Plan Supine PWR! up and rock, closed-chain shoulder flex in supine    Consulted and Agree with Plan of Care Patient             Patient will benefit from skilled therapeutic intervention in order to improve the following deficits and impairments:   Body Structure / Function / Physical Skills: ADL, Balance, Mobility, Strength, Tone, UE functional use, Flexibility, FMC, Coordination, Decreased knowledge of precautions, GMC, ROM, Decreased knowledge of use of DME, Dexterity, IADL       Visit Diagnosis: Other symptoms and signs involving the nervous system  Other lack of coordination  Other symptoms and signs involving the musculoskeletal system  OCCUPATIONAL THERAPY DISCHARGE SUMMARY    Current functional level related to goals / functional outcomes: Pt made excellent overall progress achieving all long term goals and 4/5 short term goals.   Remaining deficits: Rigidity, bradykinesia, decreased balance, decreased coordination, abnormal posture, decreased functional mobility.   Education / Equipment: Pt was educated regarding the following: HEP, ways to prevent future complications, community resources, adapted strategies for ADLs. Pt verbalizes understanding of all education   Patient agrees to discharge. Patient goals were partially met. Patient is being discharged due to meeting the stated rehab goals..     Problem List Patient Active Problem List   Diagnosis Date Noted   S/P TKR (total knee replacement), right 05/05/2019   Osteoarthritis of right knee 05/02/2019   Prostate cancer ([place]) 03/22/2018   Malignant neoplasm of prostate ([place]) 02/27/2018    Aldin Drees 11/17/2020, 12:50  PM Theone Murdoch, OTR/L [phone] Phone: [phone] 12:50 PM 11/17/20  [facility] [address] [place], [place], [address] Phone: [phone]   Fax:  [phone]  Name: Tayvin Preslar MRN: [id number] Date of Birth: Aug 10, 1950

## 2020-11-24 ENCOUNTER — Ambulatory Visit: Payer: PPO | Attending: Neurology

## 2020-11-24 ENCOUNTER — Other Ambulatory Visit: Payer: Self-pay

## 2020-11-24 DIAGNOSIS — R471 Dysarthria and anarthria: Secondary | ICD-10-CM | POA: Diagnosis not present

## 2020-11-24 DIAGNOSIS — R2681 Unsteadiness on feet: Secondary | ICD-10-CM | POA: Diagnosis present

## 2020-11-24 DIAGNOSIS — R1310 Dysphagia, unspecified: Secondary | ICD-10-CM | POA: Insufficient documentation

## 2020-11-24 DIAGNOSIS — R2689 Other abnormalities of gait and mobility: Secondary | ICD-10-CM | POA: Diagnosis not present

## 2020-11-24 DIAGNOSIS — R293 Abnormal posture: Secondary | ICD-10-CM | POA: Insufficient documentation

## 2020-11-24 NOTE — Therapy (Signed)
[facility] [address] [place], [place], [address] Phone: [phone]   Fax:  [phone]  Speech Language Pathology Treatment  Patient Details  Name: Matthew [name] MRN: [id number] Date of Birth: 1951-02-03 Referring Provider (SLP): Dr. Rexene Alberts   Encounter Date: 11/24/2020   End of Session - 11/24/20 1513     Visit Number 11    Number of Visits 17    Date for SLP Re-Evaluation 12/15/20    SLP Start Time 1404    SLP Stop Time  9024    SLP Time Calculation (min) 43 min    Activity Tolerance Patient tolerated treatment well             Past Medical History:  Diagnosis Date   Anxiety    Arthritis    GERD (gastroesophageal reflux disease)    occ remote history   Hypertension    Parkinson disease ([place])    Prostate cancer ([place])    Right knee pain    questionable meniscal tear   Seasonal allergies     Past Surgical History:  Procedure Laterality Date   COLONOSCOPY     Leg Laceration Repair  Left    Age 70   PELVIC LYMPH NODE DISSECTION Bilateral 03/22/2018   Procedure: PELVIC LYMPH NODE DISSECTION;  Surgeon: Ceasar Mons, MD;  [facility];  Service: Urology;  Laterality: Bilateral;   PROSTATE BIOPSY     ROBOT ASSISTED LAPAROSCOPIC RADICAL PROSTATECTOMY N/A 03/22/2018   Procedure: XI ROBOTIC ASSISTED LAPAROSCOPIC PROSTATECTOMY;  Surgeon: Ceasar Mons, MD;  [facility];  Service: Urology;  Laterality: N/A;   TOTAL KNEE ARTHROPLASTY Right 05/05/2019   Procedure: RIGHT TOTAL KNEE ARTHROPLASTY;  Surgeon: Frederik Pear, MD;  [facility];  Service: Orthopedics;  Laterality: Right;    There were no vitals filed for this visit.   Subjective Assessment - 11/24/20 1509     Subjective My wife has been understanding me - something must be going right!"    Currently in Pain? No/denies                   ADULT SLP TREATMENT - 11/24/20 1510       General Information    Behavior/Cognition Alert;Cooperative;Pleasant mood      Treatment Provided   Treatment provided Cognitive-Linquistic      Cognitive-Linquistic Treatment   Treatment focused on Dysarthria    Skilled Treatment Today in mod complex conversation of 25 minutes pt decr'd volume for 15-30 seconds x5, but incr'd volume back to WNL independently. This "lag time" below WNL volume was less than in the previous session. Outdoors on a walk for 10 minutes pt had 100% intelligibility with min-mod noisy road/traffic noise for half of the time. Pt told SLP overt s/sx of dysphagia and endorsed "every once in a while" he will ocugh with cashews. SLP told pt to swallow with effort with nuts. When SLP asked pt about drooling he stated this was occurring less as he has begun to chew gum.      Assessment / Recommendations / Plan   Plan Continue with current plan of care      Progression Toward Goals   Progression toward goals Progressing toward goals              SLP Education - 11/24/20 1512     Education Details swallow with effort with nuts    Person(s) Educated Patient    Methods Explanation    Comprehension  Verbalized understanding              SLP Short Term Goals - 10/29/20 1141       SLP SHORT TERM GOAL #1   Title pt will produce /a/ with average upper 80s dB at 30 cm over 3 sessions    Baseline 10-14-20, 10-29-20    Status Partially Met      SLP SHORT TERM GOAL #2   Title pt will demo abdominal breathing with sentences 90% accuracy x 3 sessions    Baseline 10-22-20, 10-25-20    Status Achieved      SLP SHORT TERM GOAL #3   Title pt will generate 8 minutes simple conversation with low 70s dB average in 3 sessions    Baseline 10-12-20, 10-22-20, 10-25-20    Status Achieved      SLP SHORT TERM GOAL #4   Title pt will complete clinical swallowing assessment if indicated.    Status Achieved      SLP SHORT TERM GOAL #5   Title pt will undergo objective swallow assessment (MBSS or FEES)  if clinically indicated    Status Deferred              SLP Long Term Goals - 11/24/20 1515       SLP LONG TERM GOAL #1   Title pt will use abdominal breathing >80% of the time during 10 minute simple conversation in 3 sessions    Baseline 10-22-20, 10-25-20    Period --   or 17 total visits, for all LTGs   Status Achieved      SLP LONG TERM GOAL #2   Title pt will maintain WNL vocal quality/intensity in 10 minute simple conversation outside English room x 3 sessions    Baseline 10-25-20, 10-29-20    Status Achieved      SLP LONG TERM GOAL #3   Title pt will report fewer requests to repeat himself than prior to ST in 3 sessions    Baseline 11-24-20    Time 3    Period Weeks    Status Revised      SLP LONG TERM GOAL #4   Title pt will tell SLP 3 overt s/s difficulty oropharyngeal dysphagia with modified independence    Status Achieved      SLP LONG TERM GOAL #5   Title pt will report less frequent drooling than prior to eval    Status Achieved      SLP LONG TERM GOAL #6   Title pt will produce WNL volume in 20 minutes mod complex conversation in 3 sessions    Baseline 11-17-20, 11-24-20    Time 3    Period Weeks    Status On-going              Plan - 11/24/20 1513     Clinical Impression Statement Patient presents with 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 Pt's WNL volume is again beginning to be carried over more and more into spontaneous conversation during the session. Pt remarked today he has "every once in awhile" coughing with cashews which SLP suggested to swallow with effort with all nuts. SLP to cont monitor swallow function and pt may still require objective swallow eval at some point. I recommend skilled ST to address communication and if necessary, dysphagia, in order to improve pt's communication and quality of life.    Speech Therapy Frequency 2x / week    Duration 8 weeks   or 17 visits  Treatment/Interventions Aspiration precaution  training;Language facilitation;Environmental controls;Cueing hierarchy;SLP instruction and feedback;Compensatory techniques;Functional tasks;Compensatory strategies;Patient/family education;Diet toleration management by SLP;Other (comment)   MBS if warranted   Potential to Achieve Goals Good    Consulted and Agree with Plan of Care Patient             Patient will benefit from skilled therapeutic intervention in order to improve the following deficits and impairments:   Dysarthria and anarthria  Dysphagia, unspecified type    Problem List Patient Active Problem List   Diagnosis Date Noted   S/P TKR (total knee replacement), right 05/05/2019   Osteoarthritis of right knee 05/02/2019   Prostate cancer ([place]) 03/22/2018   Malignant neoplasm of prostate ([place]) 02/27/2018    [facility] ,[place], [place]  11/24/2020, 3:16 PM  [facility] [address] [place], [place], [address] Phone: [phone]   Fax:  [phone]   Name: Matthew [name] MRN: [id number] Date of Birth: [birth year]

## 2020-11-26 ENCOUNTER — Other Ambulatory Visit: Payer: Self-pay

## 2020-11-26 ENCOUNTER — Ambulatory Visit: Payer: PPO

## 2020-11-26 DIAGNOSIS — R471 Dysarthria and anarthria: Secondary | ICD-10-CM | POA: Diagnosis not present

## 2020-11-26 DIAGNOSIS — R1310 Dysphagia, unspecified: Secondary | ICD-10-CM

## 2020-11-29 NOTE — Therapy (Signed)
[facility] [address] [place], [place], [address] Phone: [phone]   Fax:  [phone]  Speech Language Pathology Treatment  Patient Details  Name: Matthew [name] MRN: [id number] Date of Birth: 1950/10/02 Referring Provider (SLP): Dr. Rexene Alberts   Encounter Date: 11/26/2020   End of Session - 11/29/20 0832     Visit Number 12    Number of Visits 17    Date for SLP Re-Evaluation 12/15/20    SLP Start Time 1404    SLP Stop Time  3825    SLP Time Calculation (min) 41 min    Activity Tolerance Patient tolerated treatment well             Past Medical History:  Diagnosis Date   Anxiety    Arthritis    GERD (gastroesophageal reflux disease)    occ remote history   Hypertension    Parkinson disease ([place])    Prostate cancer ([place])    Right knee pain    questionable meniscal tear   Seasonal allergies     Past Surgical History:  Procedure Laterality Date   COLONOSCOPY     Leg Laceration Repair  Left    Age 70   PELVIC LYMPH NODE DISSECTION Bilateral 03/22/2018   Procedure: PELVIC LYMPH NODE DISSECTION;  Surgeon: Ceasar Mons, MD;  [facility];  Service: Urology;  Laterality: Bilateral;   PROSTATE BIOPSY     ROBOT ASSISTED LAPAROSCOPIC RADICAL PROSTATECTOMY N/A 03/22/2018   Procedure: XI ROBOTIC ASSISTED LAPAROSCOPIC PROSTATECTOMY;  Surgeon: Ceasar Mons, MD;  [facility];  Service: Urology;  Laterality: N/A;   TOTAL KNEE ARTHROPLASTY Right 05/05/2019   Procedure: RIGHT TOTAL KNEE ARTHROPLASTY;  Surgeon: Frederik Pear, MD;  [facility];  Service: Orthopedics;  Laterality: Right;    There were no vitals filed for this visit.   Subjective Assessment - 11/29/20 0830     Subjective "Overall I don't think I had any issues yesterday (at horseshoes)."    Currently in Pain? No/denies                   ADULT SLP TREATMENT - 11/29/20 0001       General Information    Behavior/Cognition Alert;Cooperative;Pleasant mood      Treatment Provided   Treatment provided Cognitive-Linquistic      Cognitive-Linquistic Treatment   Treatment focused on Dysarthria    Skilled Treatment Pt now using chewing gum, stating drooling is less than prior to initiation of ST.Today in mod complex conversation of 21 minutes pt incr'd volume back to WNL independently, and outdoors on a walk for 15 minutes pt had 100% intelligibility with min-mod noisy road/traffic noise for half of the time.      Assessment / Recommendations / Plan   Plan Continue with current plan of care   d/c next 1-3 visits     Progression Toward Goals   Progression toward goals Progressing toward goals                SLP Short Term Goals - 10/29/20 1141       SLP SHORT TERM GOAL #1   Title pt will produce /a/ with average upper 80s dB at 30 cm over 3 sessions    Baseline 10-14-20, 10-29-20    Status Partially Met      SLP SHORT TERM GOAL #2   Title pt will demo abdominal breathing with sentences 90% accuracy x 3 sessions    Baseline  10-22-20, 10-25-20    Status Achieved      SLP SHORT TERM GOAL #3   Title pt will generate 8 minutes simple conversation with low 70s dB average in 3 sessions    Baseline 10-12-20, 10-22-20, 10-25-20    Status Achieved      SLP SHORT TERM GOAL #4   Title pt will complete clinical swallowing assessment if indicated.    Status Achieved      SLP SHORT TERM GOAL #5   Title pt will undergo objective swallow assessment (MBSS or FEES) if clinically indicated    Status Deferred              SLP Long Term Goals - 11/29/20 5188       SLP LONG TERM GOAL #1   Title pt will use abdominal breathing >80% of the time during 10 minute simple conversation in 3 sessions    Baseline 10-22-20, 10-25-20    Period --   or 17 total visits, for all LTGs   Status Achieved      SLP LONG TERM GOAL #2   Title pt will maintain WNL vocal quality/intensity in 10 minute simple  conversation outside [place] room x 3 sessions    Baseline 10-25-20, 10-29-20    Status Achieved      SLP LONG TERM GOAL #3   Title pt will report fewer requests to repeat himself than prior to ST in 3 sessions    Baseline 11-24-20, 11-26-20    Time 3    Period Weeks    Status Revised      SLP LONG TERM GOAL #4   Title pt will tell SLP 3 overt s/s difficulty oropharyngeal dysphagia with modified independence    Status Achieved      SLP LONG TERM GOAL #5   Title pt will report less frequent drooling than prior to eval    Status Achieved      SLP LONG TERM GOAL #6   Title pt will produce WNL volume in 20 minutes mod complex conversation in 3 sessions    Baseline 11-17-20, 11-24-20, 11-26-20    Status Achieved              Plan - 11/29/20 4166     Clinical Impression Statement Patient presents with 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 Pt's WNL volume is carried over more and more into spontaneous conversation during the session, both in and outdoors. SLP to cont monitor swallow function and pt may still require objective swallow eval at some point. I recommend skilled ST to address communication and if necessary, dysphagia, in order to improve pt's communication and quality of life.    Speech Therapy Frequency 2x / week    Duration 8 weeks   or 17 visits   Treatment/Interventions Aspiration precaution training;Language facilitation;Environmental controls;Cueing hierarchy;SLP instruction and feedback;Compensatory techniques;Functional tasks;Compensatory strategies;Patient/family education;Diet toleration management by SLP;Other (comment)   MBS if warranted   Potential to Achieve Goals Good    Consulted and Agree with Plan of Care Patient             Patient will benefit from skilled therapeutic intervention in order to improve the following deficits and impairments:   Dysarthria and anarthria  Dysphagia, unspecified type    Problem List Patient Active Problem List    Diagnosis Date Noted   S/P TKR (total knee replacement), right 05/05/2019   Osteoarthritis of right knee 05/02/2019   Prostate cancer ([place]) 03/22/2018   Malignant neoplasm of  prostate ([place]) 02/27/2018    [facility] ,[place], [place]  11/29/2020, 8:34 AM  [facility] [address] [place], [place], [address] Phone: [phone]   Fax:  [phone]   Name: Maxamillion Banas MRN: [id number] Date of Birth: 10/30/1950

## 2020-11-30 ENCOUNTER — Ambulatory Visit: Payer: PPO | Admitting: Physical Therapy

## 2020-11-30 ENCOUNTER — Ambulatory Visit: Payer: PPO

## 2020-11-30 ENCOUNTER — Other Ambulatory Visit: Payer: Self-pay

## 2020-11-30 DIAGNOSIS — R2681 Unsteadiness on feet: Secondary | ICD-10-CM

## 2020-11-30 DIAGNOSIS — R471 Dysarthria and anarthria: Secondary | ICD-10-CM

## 2020-11-30 DIAGNOSIS — R1310 Dysphagia, unspecified: Secondary | ICD-10-CM

## 2020-11-30 DIAGNOSIS — R293 Abnormal posture: Secondary | ICD-10-CM

## 2020-11-30 DIAGNOSIS — R2689 Other abnormalities of gait and mobility: Secondary | ICD-10-CM

## 2020-11-30 NOTE — Patient Instructions (Signed)
Access Code: [email or web address] URL: [email or web address] Date: 11/30/2020 Prepared by: Mady Haagensen  Exercises Side to side weightshift - 1-2 x daily - 5 x weekly - 1 sets - 5-10 reps Staggered Stance Forward Backward Weight Shift with Counter Support - 1-2 x daily - 5 x weekly - 1 sets - 10 reps Standing posture stretch - 1-2 x daily - 5 x weekly - 1 sets - 5 reps - 10sec hold  Added 11/30/2020: Forward Step Up - 1 x daily - 5 x weekly - 1-2 sets - 10 reps - 3 sec hold

## 2020-11-30 NOTE — Therapy (Signed)
[facility] [address] [place], [place], [address] Phone: [phone]   Fax:  [phone]  Speech Language Pathology Treatment  Patient Details  Name: Matthew [name] MRN: [id number] Date of Birth: Aug 21, 1950 Referring Provider (SLP): Dr. Rexene Alberts   Encounter Date: 11/30/2020   End of Session - 11/30/20 1617     Visit Number 13    Number of Visits 17    Date for SLP Re-Evaluation 12/15/20    SLP Start Time 1449    SLP Stop Time  8527    SLP Time Calculation (min) 42 min    Activity Tolerance Patient tolerated treatment well             Past Medical History:  Diagnosis Date   Anxiety    Arthritis    GERD (gastroesophageal reflux disease)    occ remote history   Hypertension    Parkinson disease ([facility])    Prostate cancer ([place])    Right knee pain    questionable meniscal tear   Seasonal allergies     Past Surgical History:  Procedure Laterality Date   COLONOSCOPY     Leg Laceration Repair  Left    Age 70   PELVIC LYMPH NODE DISSECTION Bilateral 03/22/2018   Procedure: PELVIC LYMPH NODE DISSECTION;  Surgeon: Ceasar Mons, MD;  Location: [facility];  Service: Urology;  Laterality: Bilateral;   PROSTATE BIOPSY     ROBOT ASSISTED LAPAROSCOPIC RADICAL PROSTATECTOMY N/A 03/22/2018   Procedure: XI ROBOTIC ASSISTED LAPAROSCOPIC PROSTATECTOMY;  Surgeon: Ceasar Mons, MD;  [facility];  Service: Urology;  Laterality: N/A;   TOTAL KNEE ARTHROPLASTY Right 05/05/2019   Procedure: RIGHT TOTAL KNEE ARTHROPLASTY;  Surgeon: Frederik Pear, MD;  [facility];  Service: Orthopedics;  Laterality: Right;    There were no vitals filed for this visit.   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 No/denies                   ADULT SLP TREATMENT - 11/30/20 1501        General Information   Behavior/Cognition Alert;Cooperative;Pleasant mood      Treatment Provided   Treatment provided Cognitive-Linquistic      Dysphagia Treatment   Other treatment/comments Pt denies any choking issues in past 3-4 weeks (far back as pt can recall). Notes that with cashews he will sometimes cough on small piece of cashew. SLP told pt to use chin tuck as cashew is lighter and a piece on back of tongue may be sucked down with an inhalation. Pt to try this from now on with cashews. Pt has cont'd to use gum which has decr'd drooling consistently when he uses it. Regularly chews a piece in AM and piece in PM. SLP told pt to habitualize swallowing prior to talking in order to clear any buildup of saliva. Pt acknowledged understanding of these things.      Cognitive-Linquistic Treatment   Treatment focused on Dysarthria    Skilled Treatment Loud /a/ used today to recalibrate pt's speech system to improve speech loudness. Pt averaged mid-upper 80s dB with sound level meter placed approx 30 cm away from pt's mouth. Matthew [name] maintained WNL volume in 5 mintues conversation (mod complex) in the ST room and then 21 mintues outdoors. Pt should be on target for d/c next session.  Assessment / Recommendations / Plan   Plan Continue with current plan of care   d/c likely next visit     Progression Toward Goals   Progression toward goals Progressing toward goals                SLP Short Term Goals - 10/29/20 1141       SLP SHORT TERM GOAL #1   Title pt will produce /a/ with average upper 80s dB at 30 cm over 3 sessions    Baseline 10-14-20, 10-29-20    Status Partially Met      SLP SHORT TERM GOAL #2   Title pt will demo abdominal breathing with sentences 90% accuracy x 3 sessions    Baseline 10-22-20, 10-25-20    Status Achieved      SLP SHORT TERM GOAL #3   Title pt will generate 8 minutes simple conversation with low 70s dB average in 3 sessions    Baseline 10-12-20, 10-22-20,  10-25-20    Status Achieved      SLP SHORT TERM GOAL #4   Title pt will complete clinical swallowing assessment if indicated.    Status Achieved      SLP SHORT TERM GOAL #5   Title pt will undergo objective swallow assessment (MBSS or FEES) if clinically indicated    Status Deferred              SLP Long Term Goals - 11/30/20 1619       SLP LONG TERM GOAL #1   Title pt will use abdominal breathing >80% of the time during 10 minute simple conversation in 3 sessions    Baseline 10-22-20, 10-25-20    Period --   or 17 total visits, for all LTGs   Status Achieved      SLP LONG TERM GOAL #2   Title pt will maintain WNL vocal quality/intensity in 10 minute simple conversation outside [place] room x 3 sessions    Baseline 10-25-20, 10-29-20    Status Achieved      SLP LONG TERM GOAL #3   Title pt will report fewer requests to repeat himself than prior to ST in 3 sessions    Baseline 11-24-20, 11-26-20    Time 3    Period Weeks    Status Revised      SLP LONG TERM GOAL #4   Title pt will tell SLP 3 overt s/s difficulty oropharyngeal dysphagia with modified independence    Status Achieved      SLP LONG TERM GOAL #5   Title pt will report less frequent drooling than prior to eval    Status Achieved      SLP LONG TERM GOAL #6   Title pt will produce WNL volume in 20 minutes mod complex conversation in 3 sessions    Baseline 11-17-20, 11-24-20, 11-26-20    Status Achieved              Plan - 11/30/20 1617     Clinical Impression Statement Patient presents with 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 Pt's WNL volume is carried over more and more into spontaneous conversation during the session, both in and outdoors. SLP to cont monitor swallow function and pt may still require objective swallow eval at some point. I recommend skilled ST to address communication and if necessary, dysphagia, in order to improve pt's communication and quality of life.    Speech Therapy Frequency 2x  / week    Duration 8 weeks  or 17 visits   Treatment/Interventions Aspiration precaution training;Language facilitation;Environmental controls;Cueing hierarchy;SLP instruction and feedback;Compensatory techniques;Functional tasks;Compensatory strategies;Patient/family education;Diet toleration management by SLP;Other (comment)   MBS if warranted   Potential to Achieve Goals Good    Consulted and Agree with Plan of Care Patient             Patient will benefit from skilled therapeutic intervention in order to improve the following deficits and impairments:   Dysarthria and anarthria  Dysphagia, unspecified type    Problem List Patient Active Problem List   Diagnosis Date Noted   S/P TKR (total knee replacement), right 05/05/2019   Osteoarthritis of right knee 05/02/2019   Prostate cancer ([facility]) 03/22/2018   Malignant neoplasm of prostate ([place] [facility]) 02/27/2018    [place] ,[place], [place]  11/30/2020, 4:20 PM  [facility] [address] [place], [place], [address] Phone: [phone]   Fax:  [phone]   Name: Matthew [name] MRN: [id number] Date of Birth: [birth year]

## 2020-12-01 NOTE — Therapy (Signed)
[facility] [address] [place], [place], [address] Phone: [phone]   Fax:  [phone]  Physical Therapy Treatment/REcert  Patient Details  Name: Matthew [name] MRN: [id number] Date of Birth: Dec 23, 1950 Referring Provider (PT): Star Age   Encounter Date: 11/30/2020   PT End of Session - 11/30/20 1449     Visit Number 12    Number of Visits 13    Date for PT Re-Evaluation 18/84/16   per recert 10/26/3014   Authorization Type [employer]    PT Start Time 1406    PT Stop Time 0109    PT Time Calculation (min) 43 min    Activity Tolerance Patient tolerated treatment well    Behavior During Therapy [facility] for tasks assessed/performed             Past Medical History:  Diagnosis Date   Anxiety    Arthritis    GERD (gastroesophageal reflux disease)    occ remote history   Hypertension    Parkinson disease ([facility])    Prostate cancer ([facility])    Right knee pain    questionable meniscal tear   Seasonal allergies     Past Surgical History:  Procedure Laterality Date   COLONOSCOPY     Leg Laceration Repair  Left    Age 70   PELVIC LYMPH NODE DISSECTION Bilateral 03/22/2018   Procedure: PELVIC LYMPH NODE DISSECTION;  Surgeon: Ceasar Mons, MD;  [facility];  Service: Urology;  Laterality: Bilateral;   PROSTATE BIOPSY     ROBOT ASSISTED LAPAROSCOPIC RADICAL PROSTATECTOMY N/A 03/22/2018   Procedure: XI ROBOTIC ASSISTED LAPAROSCOPIC PROSTATECTOMY;  Surgeon: Ceasar Mons, MD;  [facility];  Service: Urology;  Laterality: N/A;   TOTAL KNEE ARTHROPLASTY Right 05/05/2019   Procedure: RIGHT TOTAL KNEE ARTHROPLASTY;  Surgeon: Frederik Pear, MD;  [facility];  Service: Orthopedics;  Laterality: Right;    There were no vitals filed for this visit.   Subjective Assessment - 11/30/20 1409     Subjective Have had a few weeks off due to scheduling issues/quarantine from  Covid exposure.  Trying to be more aware of my posture when I'm walking.    Patient is accompained by: --   wife   Pertinent History hx of prostate cancer/removal, incontinence, hx of R TKR 04/2019    Patient Stated Goals Pt's goals are to improve balance and try to avoid my overall slowed mobility.    Currently in Pain? No/denies    Pain Onset More than a month ago                               [facility] Adult PT Treatment/Exercise - 11/30/20 1400       Ambulation/Gait   Ambulation/Gait Yes    Ambulation/Gait Assistance 5: Supervision    Ambulation Distance (Feet) 1300 Feet    Assistive device None    Gait Pattern Step-through pattern;Decreased arm swing - right;Decreased arm swing - left;Decreased step length - right;Decreased step length - left;Right flexed knee in stance;Left flexed knee in stance;Decreased trunk rotation;Trunk flexed;Narrow base of support    Ambulation Surface Level;Indoor    Gait Comments Pt reports he's been concentrating on how I walk lately, and doing extra steps (stairs)/hallway in home      Knee/Hip Exercises: Standing   Forward Step Up Both;10 reps;Step Height: 6";2 sets;Hand Hold: 2    Forward Step Up Limitations  step up/up, down/down x 10 reps, then single limb step ups x 10 each leg    Other Standing Knee Exercises Single limb stance through hip for improved hip stability:  alt step taps to 6" step, then 12" step x 10 reps each, no UE support            Gait for warm-up 230 ft, at beginning of session-cues for posture, arm swing, widened BOS. Performed wide BOS anterior/posterior weightshifting for stretching prior to 6MWT, x 5 reps.   NMR:   Pt verbally reviews his HEP. Performed standing PWR! Moves:  x 10 reps each of PWR! Up, PWR! [place], [place]! Twist, and PWR! Step.  He demo good understanding and technique. Performed standing PWR! Moves Flow x 5 reps for increased dual task demand.  Visual/verbal cues     PT Education -  12/01/20 1749     Education Details Addition to HEP, POC    Person(s) Educated Patient    Methods Explanation;Demonstration;Handout    Comprehension Verbalized understanding;Returned demonstration              PT Short Term Goals - 10/29/20 1114       PT SHORT TERM GOAL #1   Title Pt will be indepedent with Parkinson's specific HEP to address balance, transfers, gait for improved overall mobility.  TARGET 10/29/2020 (delayed date due to delayed start-Pt wants OT, PT, speech together)    Time 4    Period Weeks    Status Achieved      PT SHORT TERM GOAL #2   Title Pt will perform at least 8 of 10 reps of sit<>stand transfers from < 18 inch surfaces, with no posterior lean or LOB.    Time 4    Period Weeks    Status Achieved      PT SHORT TERM GOAL #3   Title Pt will improve TUG score to less than or equal to 13.5 sec for decreased fall risk.    Baseline 14.69; 8.37 seconds on 10/29/20    Time 4    Period Weeks    Status Achieved      PT SHORT TERM GOAL #4   Title 6 MWT to be assessed and goal written as appropriate.    Baseline 931' with LTG written on 10/05/20; 1036 ft 10/25/2020    Time 4    Period Weeks    Status Achieved               PT Long Term Goals - 11/30/20 1426       PT LONG TERM GOAL #1   Title Pt will be independent with progression of HEP for Parkinson's related deficits.  TARGET 11/19/2020(delayed due to delayed start for coordination of schedules)    Time 6    Period Weeks    Status Achieved      PT LONG TERM GOAL #2   Title Pt will improve 5x sit<>stand to less than or equal to 10 sec for improved transfer efficiency and safety.    Baseline 11.97 sec> 9.56 sec 11/30/2020    Time 6    Period Weeks    Status Achieved      PT LONG TERM GOAL #3   Title Pt will improve MiniBESTest score to at least 25/28 for decreased fall risk.    Baseline 23/28    Time 6    Period Weeks    Status On-going      PT LONG TERM GOAL #4  Title Pt will improve  TUG cognitive score to less than or equal to 15 sec for improved dual tasking with gait.    Baseline 15.31 sec    Time 6    Period Weeks    Status On-going      PT LONG TERM GOAL #5   Title Pt will verbalize plans for continued community fitness upon d/c from PT.    Time 6    Period Weeks    Status On-going      PT LONG TERM GOAL #6   Title Pt will improve 6MWT distance by at least 200' from baseline in order to demo improved gait endurance for community distances.    Baseline 931' on 10/05/20; 1036 10/25/20> 1297 ft 12/01/20    Time 6    Period Weeks    Status Achieved               New LTGs for recert:    PT Long Term Goals - 12/01/20 1755       PT LONG TERM GOAL #1   Title Pt will be independent with progression of HEP for Parkinson's related deficits.  TARGET 11/19/2020(delayed due to delayed start for coordination of schedules)      PT LONG TERM GOAL #2   Title Pt will improve 5x sit<>stand to less than or equal to 10 sec for improved transfer efficiency and safety.    Baseline 11.97 sec> 9.56 sec 11/30/2020      PT LONG TERM GOAL #3   Title Pt will improve MiniBESTest score to at least 25/28 for decreased fall risk.    Baseline 23/28    Time 1    Period Weeks    Status On-going      PT LONG TERM GOAL #4   Title Pt will improve TUG cognitive score to less than or equal to 15 sec for improved dual tasking with gait.    Baseline 15.31 sec    Time 1    Period Weeks    Status On-going      PT LONG TERM GOAL #5   Title Pt will verbalize plans for continued community fitness upon d/c from PT.    Time 1    Period Weeks    Status On-going      PT LONG TERM GOAL #6   Title Pt will improve 6MWT distance by at least 200' from baseline in order to demo improved gait endurance for community distances.    Baseline 931' on 10/05/20; 1036 10/25/20> 1297 ft 12/01/20               Plan - 12/01/20 1749     Clinical Impression Statement Recert completed today to cover  this visit and next visit, as pt has been out of therapy for several weeks due to exposure to Covid and clinic scheduling conflicts.  Began assessing LTGs today,w tih pt meeting 3 of 6 goals thus far.  He has met LTG 1 for HEP, LTG 2 for 5x sit<>stand, and LTG 6 for improved distance on 6MWT.  Remaining goals ongoing and to be assessed next visit.  Pt is demonstrating good ability to perform his exercises and walking program at home with good improvements in goal measures, despite being out of therapy.  He will benefit from this week's therapy sessions to check remaining goals and finalize ongoing community fitness program to maximize gains made in therapy.    Personal Factors and Comorbidities Comorbidity 3+    Comorbidities  anxiety, arthritis, GERD, prostate cancer, HTN, s/p R TKR 04/2019    Examination-Activity Limitations Locomotion Level;Transfers;Stand;Stairs    Examination-Participation Restrictions Community Activity;Other   playing with grandchildren   Stability/Clinical Decision Making Evolving/Moderate complexity    Rehab Potential Good    PT Frequency 2x / week    PT Duration Other (comment)   1 additional week, wk of 11/30/20   PT Treatment/Interventions ADLs/Self Care Home Management;Gait training;Functional mobility training;Therapeutic activities;Therapeutic exercise;Balance training;Neuromuscular re-education;DME Instruction;Patient/family education    PT Next Visit Plan Check remaining goals, plan for d/c.    Consulted and Agree with Plan of Care Patient             Patient will benefit from skilled therapeutic intervention in order to improve the following deficits and impairments:  Abnormal gait, Difficulty walking, Decreased balance, Decreased mobility, Postural dysfunction, Decreased strength, Impaired flexibility  Visit Diagnosis: Other abnormalities of gait and mobility  Unsteadiness on feet  Abnormal posture     Problem List Patient Active Problem List    Diagnosis Date Noted   S/P TKR (total knee replacement), right 05/05/2019   Osteoarthritis of right knee 05/02/2019   Prostate cancer ([place]) 03/22/2018   Malignant neoplasm of prostate ([place]) 02/27/2018    Matthew [name] W. 12/01/2020, 5:54 PM Frazier Butt., PT   [facility] [address] [place], [place], [address] Phone: [phone]   Fax:  [phone]  Name: Matthew [name] MRN: [id number] Date of Birth: 05/24/1950

## 2020-12-02 ENCOUNTER — Ambulatory Visit: Payer: PPO | Admitting: Physical Therapy

## 2020-12-02 ENCOUNTER — Ambulatory Visit: Payer: PPO

## 2020-12-02 ENCOUNTER — Other Ambulatory Visit: Payer: Self-pay

## 2020-12-02 ENCOUNTER — Encounter: Payer: Self-pay | Admitting: Physical Therapy

## 2020-12-02 DIAGNOSIS — R1310 Dysphagia, unspecified: Secondary | ICD-10-CM

## 2020-12-02 DIAGNOSIS — R471 Dysarthria and anarthria: Secondary | ICD-10-CM

## 2020-12-02 DIAGNOSIS — R2681 Unsteadiness on feet: Secondary | ICD-10-CM

## 2020-12-02 DIAGNOSIS — R2689 Other abnormalities of gait and mobility: Secondary | ICD-10-CM

## 2020-12-02 NOTE — Therapy (Signed)
[facility] [place], [place], [address] Phone: [phone]   Fax:  [phone]  Speech Language Pathology Treatment/discharge summary  Patient Details  Name: Matthew [name] MRN: [id number] Date of Birth: [birth year] Referring Provider (SLP): Dr. Rexene Alberts   Encounter Date: 12/02/2020   End of Session - 12/02/20 1639     Visit Number 14    Number of Visits 17    Date for SLP Re-Evaluation 12/15/20    SLP Start Time 1450    SLP Stop Time  6962    SLP Time Calculation (min) 43 min    Activity Tolerance Patient tolerated treatment well             Past Medical History:  Diagnosis Date   Anxiety    Arthritis    GERD (gastroesophageal reflux disease)    occ remote history   Hypertension    Parkinson disease ([facility])    Prostate cancer ([place])    Right knee pain    questionable meniscal tear   Seasonal allergies     Past Surgical History:  Procedure Laterality Date   COLONOSCOPY     Leg Laceration Repair  Left    Age 70   PELVIC LYMPH NODE DISSECTION Bilateral 03/22/2018   Procedure: PELVIC LYMPH NODE DISSECTION;  Surgeon: Ceasar Mons, MD;  [facility];  Service: Urology;  Laterality: Bilateral;   PROSTATE BIOPSY     ROBOT ASSISTED LAPAROSCOPIC RADICAL PROSTATECTOMY N/A 03/22/2018   Procedure: XI ROBOTIC ASSISTED LAPAROSCOPIC PROSTATECTOMY;  Surgeon: Ceasar Mons, MD;  [facility];  Service: Urology;  Laterality: N/A;   TOTAL KNEE ARTHROPLASTY Right 05/05/2019   Procedure: RIGHT TOTAL KNEE ARTHROPLASTY;  Surgeon: Frederik Pear, MD;  [facility];  Service: Orthopedics;  Laterality: Right;    There were no vitals filed for this visit.   SPEECH THERAPY DISCHARGE SUMMARY  Visits from Start of Care: 14  Current functional level related to goals / functional outcomes: See goals below.   Remaining deficits: None - possibly mild dysarthria with fatigue.    Education / Equipment: Chewing gum for hyposalivation/coughing on saliva, loud /a/, performing loud /a/ helpful after d/c   Patient agrees to discharge. Patient goals were met. Patient is being discharged due to meeting the stated rehab goals..      Subjective Assessment - 12/02/20 1451     Subjective "I feel better with my voice lately. I have to keep concentrating on it."    Currently in Pain? No/denies                   ADULT SLP TREATMENT - 12/02/20 1453       General Information   Behavior/Cognition Alert;Cooperative;Pleasant mood      Treatment Provided   Treatment provided Cognitive-Linquistic      Cognitive-Linquistic Treatment   Treatment focused on Dysarthria    Skilled Treatment Loud /a/ used today to recalibrate pt's speech system to improve speech loudness. Pt averaged upper 80s dB with sound level meter placed approx 30 cm away from pt's mouth. Everyday sentences were produced with average low 80s dB. SLP reitereated to pt to cont to perform these at LEAST 5 days/week. [name] maintained WNL volume in 14 mintues conversation (mod complex) in the ST room and then 23 mintues outdoors.      Assessment / Recommendations / Plan   Plan Discharge SLP treatment due to (comment)   met goals     Progression  Toward Goals   Progression toward goals --   see goals - d/c day             SLP Education - 12/02/20 1638     Education Details do loud /a/ gives best opportunity to maintain WNL speech loudness    Person(s) Educated Patient    Methods Explanation    Comprehension Verbalized understanding              SLP Short Term Goals - 10/29/20 1141       SLP SHORT TERM GOAL #1   Title pt will produce /a/ with average upper 80s dB at 30 cm over 3 sessions    Baseline 10-14-20, 10-29-20    Status Partially Met      SLP SHORT TERM GOAL #2   Title pt will demo abdominal breathing with sentences 90% accuracy x 3 sessions    Baseline 10-22-20, 10-25-20     Status Achieved      SLP SHORT TERM GOAL #3   Title pt will generate 8 minutes simple conversation with low 70s dB average in 3 sessions    Baseline 10-12-20, 10-22-20, 10-25-20    Status Achieved      SLP SHORT TERM GOAL #4   Title pt will complete clinical swallowing assessment if indicated.    Status Achieved      SLP SHORT TERM GOAL #5   Title pt will undergo objective swallow assessment (MBSS or FEES) if clinically indicated    Status Deferred              SLP Long Term Goals - 12/02/20 1640       SLP LONG TERM GOAL #1   Title pt will use abdominal breathing >80% of the time during 10 minute simple conversation in 3 sessions    Baseline 10-22-20, 10-25-20    Period --   or 17 total visits, for all LTGs   Status Achieved      SLP LONG TERM GOAL #2   Title pt will maintain WNL vocal quality/intensity in 10 minute simple conversation outside [place] room x 3 sessions    Baseline 10-25-20, 10-29-20    Status Achieved      SLP LONG TERM GOAL #3   Title pt will report fewer requests to repeat himself than prior to [place] in 3 sessions    Baseline 11-24-20, 11-26-20    Status Achieved      SLP LONG TERM GOAL #4   Title pt will tell SLP 3 overt s/s difficulty oropharyngeal dysphagia with modified independence    Status Achieved      SLP LONG TERM GOAL #5   Title pt will report less frequent drooling than prior to eval    Status Achieved      SLP LONG TERM GOAL #6   Title pt will produce WNL volume in 20 minutes mod complex conversation in 3 sessions    Baseline 11-17-20, 11-24-20, 11-26-20    Status Achieved              Plan - 12/02/20 1639     Clinical Impression Statement Patient presents with 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 Pt's WNL volume is carried over more and more into spontaneous conversation during the session, both in and outdoors. Pt states his swallow function is functional/normal at this time. Pt will be d/c'd today.    Duration --   or 17 visits    Treatment/Interventions Aspiration precaution training;Language facilitation;Environmental controls;Cueing hierarchy;SLP  instruction and feedback;Compensatory techniques;Functional tasks;Compensatory strategies;Patient/family education;Diet toleration management by SLP;Other (comment)   MBS if warranted   Potential to Achieve Goals Good    Consulted and Agree with Plan of Care Patient             Patient will benefit from skilled therapeutic intervention in order to improve the following deficits and impairments:   Dysarthria and anarthria  Dysphagia, unspecified type    Problem List Patient Active Problem List   Diagnosis Date Noted   S/P TKR (total knee replacement), right 05/05/2019   Osteoarthritis of right knee 05/02/2019   Prostate cancer ([place]) 03/22/2018   Malignant neoplasm of prostate ([place]) 02/27/2018    [facility] ,[place], [place]  12/02/2020, 4:41 PM  [facility] [address] [place], [place], [address] Phone: [phone]   Fax:  [phone]   Name: Ramzy Cappelletti MRN: [id number] Date of Birth: 1950-11-04

## 2020-12-02 NOTE — Patient Instructions (Signed)
You have to continue to do your loud "ah"s at least 5 times, twice a day, at least 5 days a week to maintain loudness WNL

## 2020-12-02 NOTE — Therapy (Signed)
[facility] [address] [place], [place], [address] Phone: [phone]   Fax:  [phone]  Physical Therapy Treatment/Discharge Summary  Patient Details  Name: Matthew [name] MRN: [id number] Date of Birth: 1951/05/08 Referring Provider (PT): Star Age   PHYSICAL THERAPY DISCHARGE SUMMARY  Visits from Start of Care: 13  Current functional level related to goals / functional outcomes: Pt has met all LTGs-see below   Remaining deficits: Posture, bradykinesia, balance   Education / Equipment: Educated in [employer], continued community fitness.   Patient agrees to discharge. Patient goals were met. Patient is being discharged due to meeting the stated rehab goals.  Mady Haagensen, PT 12/02/20 3:04 PM Phone: [phone] Fax: [phone]   Encounter Date: 12/02/2020   PT End of Session - 12/02/20 1459     Visit Number 13    Number of Visits 13    Date for PT Re-Evaluation 03/70/48   per recert 8/89/1694   Authorization Type [employer]    PT Start Time 1405    PT Stop Time 1443    PT Time Calculation (min) 38 min    Activity Tolerance Patient tolerated treatment well    Behavior During Therapy [facility] for tasks assessed/performed             Past Medical History:  Diagnosis Date   Anxiety    Arthritis    GERD (gastroesophageal reflux disease)    occ remote history   Hypertension    Parkinson disease ([place])    Prostate cancer ([place])    Right knee pain    questionable meniscal tear   Seasonal allergies     Past Surgical History:  Procedure Laterality Date   COLONOSCOPY     Leg Laceration Repair  Left    Age 27   PELVIC LYMPH NODE DISSECTION Bilateral 03/22/2018   Procedure: PELVIC LYMPH NODE DISSECTION;  Surgeon: Ceasar Mons, MD;  Location: [facility];  Service: Urology;  Laterality: Bilateral;   PROSTATE BIOPSY     ROBOT ASSISTED LAPAROSCOPIC RADICAL PROSTATECTOMY N/A 03/22/2018    Procedure: XI ROBOTIC ASSISTED LAPAROSCOPIC PROSTATECTOMY;  Surgeon: Ceasar Mons, MD;  Location: [facility];  Service: Urology;  Laterality: N/A;   TOTAL KNEE ARTHROPLASTY Right 05/05/2019   Procedure: RIGHT TOTAL KNEE ARTHROPLASTY;  Surgeon: Frederik Pear, MD;  Location: [facility];  Service: Orthopedics;  Laterality: Right;    There were no vitals filed for this visit.   Subjective Assessment - 12/02/20 1403     Subjective Tried the new exercise on the steps, and I was a little wobbly.    Patient is accompained by: --   wife   Pertinent History hx of prostate cancer/removal, incontinence, hx of R TKR 04/2019    Patient Stated Goals Pt's goals are to improve balance and try to avoid my overall slowed mobility.    Currently in Pain? No/denies    Pain Onset More than a month ago                               [facility] Adult PT Treatment/Exercise - 12/02/20 0001       Standardized Balance Assessment   Standardized Balance Assessment Timed Up and Go Test      Timed Up and Go Test   TUG Normal TUG;Cognitive TUG    Normal TUG (seconds) 12    Cognitive TUG (seconds) 14   11.97  Mini-BESTest   Sit To Stand Normal: Comes to stand without use of hands and stabilizes independently.    Rise to Toes Normal: Stable for 3 s with maximum height.    Stand on one leg (left) Moderate: < 20 s   4.84, 2.81   Stand on one leg (right) Moderate: < 20 s   2.25, 4.81   Stand on one leg - lowest score 1    Compensatory Stepping Correction - Forward Normal: Recovers independently with a single, large step (second realignement is allowed).    Compensatory Stepping Correction - Backward Moderate: More than one step is required to recover equilibrium    Compensatory Stepping Correction - Left Lateral Normal: Recovers independently with 1 step (crossover or lateral OK)    Compensatory Stepping Correction - Right Lateral Normal: Recovers independently with 1 step (crossover or  lateral OK)    Stepping Corredtion Lateral - lowest score 2    Stance - Feet together, eyes open, firm surface  Normal: 30s    Stance - Feet together, eyes closed, foam surface  Normal: 30s    Incline - Eyes Closed Normal: Stands independently 30s and aligns with gravity    Change in Gait Speed Normal: Significantly changes walkling speed without imbalance    Walk with head turns - Horizontal Normal: performs head turns with no change in gait speed and good balance    Walk with pivot turns Normal: Turns with feet close FAST (< 3 steps) with good balance.    Step over obstacles Moderate: Steps over box but touches box OR displays cautious behavior by slowing gait.    Timed UP & GO with Dual Task Normal: No noticeable change in sitting, standing or walking while backward counting when compared to TUG without    Mini-BEST total score 25      High Level Balance   High Level Balance Comments Reviewed addition to HEP last visit:  single limb forward step ups, 2 sets x 10 reps-cues to "PWR! Up" through stance leg and to use UE support at needed for optimal stability and balance.  Reviewed forward<>back step and weigthshift as part of HEP.  Also performed facing counter with UE support, posterior step and weigthshift x 8 reps each side, with cues for increased deliberate intensity for step/weigthshift/return to balance in midline.  Cued patient to focus on balance recovery awareness as he performs this at home.      Therapeutic Activites    Therapeutic Activities Other Therapeutic Activities    Other Therapeutic Activities Discussed overall progress towrads goals, continued fitness program.  Verbally discussed walking program (either at [facility] or [place] walking track for indoor walking activities).  He is agreeable to d/c and return screen in 6 months.                    PT Education - 12/02/20 1459     Education Details See therapeutic activity section    Person(s)  Educated Patient    Methods Explanation    Comprehension Verbalized understanding              PT Short Term Goals - 10/29/20 1114       PT SHORT TERM GOAL #1   Title Pt will be indepedent with Parkinson's specific HEP to address balance, transfers, gait for improved overall mobility.  TARGET 10/29/2020 (delayed date due to delayed start-Pt wants OT, PT, speech together)    Time 4    Period Weeks  Status Achieved      PT SHORT TERM GOAL #2   Title Pt will perform at least 8 of 10 reps of sit<>stand transfers from < 18 inch surfaces, with no posterior lean or LOB.    Time 4    Period Weeks    Status Achieved      PT SHORT TERM GOAL #3   Title Pt will improve TUG score to less than or equal to 13.5 sec for decreased fall risk.    Baseline 14.69; 8.37 seconds on 10/29/20    Time 4    Period Weeks    Status Achieved      PT SHORT TERM GOAL #4   Title 6 MWT to be assessed and goal written as appropriate.    Baseline 931' with LTG written on 10/05/20; 1036 ft 10/25/2020    Time 4    Period Weeks    Status Achieved               PT Long Term Goals - 12/02/20 1411       PT LONG TERM GOAL #1   Title Pt will be independent with progression of HEP for Parkinson's related deficits.  TARGET 11/19/2020(delayed due to delayed start for coordination of schedules)      PT LONG TERM GOAL #2   Title Pt will improve 5x sit<>stand to less than or equal to 10 sec for improved transfer efficiency and safety.    Baseline 11.97 sec> 9.56 sec 11/30/2020      PT LONG TERM GOAL #3   Title Pt will improve MiniBESTest score to at least 25/28 for decreased fall risk.    Baseline 23/28>25/28 12/02/2020    Time 1    Period Weeks    Status Achieved      PT LONG TERM GOAL #4   Title Pt will improve TUG cognitive score to less than or equal to 15 sec for improved dual tasking with gait.    Baseline 15.31 sec    Time 1    Period Weeks    Status Achieved      PT LONG TERM GOAL #5   Title Pt  will verbalize plans for continued community fitness upon d/c from PT.    Time 1    Period Weeks    Status Achieved      PT LONG TERM GOAL #6   Title Pt will improve 6MWT distance by at least 200' from baseline in order to demo improved gait endurance for community distances.    Baseline 931' on 10/05/20; 1036 10/25/20> 1297 ft 12/01/20                   Plan - 12/02/20 1500     Clinical Impression Statement Assessed remaining LTGs today, with pt meeting LTG 2, 3, 4.  He has met all LTGs.  He has demonstrated improvement in functional strength, balance, and gait.  He verbalizes plans for continued community fitness.  He is appropriate for d/c from PT this visit today.    Personal Factors and Comorbidities Comorbidity 3+    Comorbidities anxiety, arthritis, GERD, prostate cancer, HTN, s/p R TKR 04/2019    Examination-Activity Limitations Locomotion Level;Transfers;Stand;Stairs    Examination-Participation Restrictions Community Activity;Other   playing with grandchildren   Stability/Clinical Decision Making Evolving/Moderate complexity    Rehab Potential Good    PT Frequency 2x / week    PT Duration Other (comment)   1 additional week, wk of 11/30/20  PT Treatment/Interventions ADLs/Self Care Home Management;Gait training;Functional mobility training;Therapeutic activities;Therapeutic exercise;Balance training;Neuromuscular re-education;DME Instruction;Patient/family education    PT Next Visit Plan Discharge this visit; plan to return for screen in 6 months.    Consulted and Agree with Plan of Care Patient             Patient will benefit from skilled therapeutic intervention in order to improve the following deficits and impairments:  Abnormal gait, Difficulty walking, Decreased balance, Decreased mobility, Postural dysfunction, Decreased strength, Impaired flexibility  Visit Diagnosis: Unsteadiness on feet  Other abnormalities of gait and mobility     Problem  List Patient Active Problem List   Diagnosis Date Noted   S/P TKR (total knee replacement), right 05/05/2019   Osteoarthritis of right knee 05/02/2019   Prostate cancer ([place]) 03/22/2018   Malignant neoplasm of prostate ([place]) 02/27/2018    Grainne Knights W. 12/02/2020, 3:02 PM Frazier Butt., PT  [facility] [address] [place], [place], [address] Phone: [phone]   Fax:  [phone]  Name: Matthew [name] MRN: [id number] Date of Birth: 1950/10/12

## 2020-12-08 ENCOUNTER — Telehealth: Payer: Self-pay | Admitting: Neurology

## 2020-12-08 NOTE — Telephone Encounter (Signed)
I called wife of pt. She was relaying that neupro 2mg  patch discontinued 11-12-20, seemed like the swelling decreased from that, but has returned over the week end.  ? If still SE of neupro patch.  (She stated has still ankle swelling and some lightheadedness. Has appt with pcp tomorrow (Dr. Harrington Challenger) to evaluate. She may try some compression stockings and elevation of legs.I told her I will send message to Dr Rexene Alberts as well for any recommendations when she returns next week.  She apprecited this.

## 2020-12-08 NOTE — Telephone Encounter (Signed)
Pt's wife called stating that since her husband has stopped taking the rotigotine (NEUPRO) 2 MG/24HR, his feet are still swelling. Pt's wife is requesting a call back.

## 2020-12-09 DIAGNOSIS — R609 Edema, unspecified: Secondary | ICD-10-CM | POA: Diagnosis not present

## 2020-12-09 DIAGNOSIS — G20 Parkinson's disease: Secondary | ICD-10-CM | POA: Diagnosis not present

## 2020-12-10 ENCOUNTER — Encounter: Payer: Self-pay | Admitting: Neurology

## 2020-12-10 DIAGNOSIS — E7800 Pure hypercholesterolemia, unspecified: Secondary | ICD-10-CM | POA: Diagnosis not present

## 2020-12-10 DIAGNOSIS — I10 Essential (primary) hypertension: Secondary | ICD-10-CM | POA: Diagnosis not present

## 2020-12-10 DIAGNOSIS — C61 Malignant neoplasm of prostate: Secondary | ICD-10-CM | POA: Diagnosis not present

## 2020-12-10 DIAGNOSIS — M19049 Primary osteoarthritis, unspecified hand: Secondary | ICD-10-CM | POA: Diagnosis not present

## 2020-12-10 DIAGNOSIS — G20 Parkinson's disease: Secondary | ICD-10-CM | POA: Diagnosis not present

## 2020-12-16 DIAGNOSIS — I10 Essential (primary) hypertension: Secondary | ICD-10-CM | POA: Diagnosis not present

## 2020-12-22 DIAGNOSIS — S060X9A Concussion with loss of consciousness of unspecified duration, initial encounter: Secondary | ICD-10-CM | POA: Diagnosis not present

## 2021-01-03 NOTE — Progress Notes (Signed)
Assessment/Plan:   Akinetic rigid Parkinson's disease  -Change timing of levodopa so that patient takes carbidopa/levodopa 25/100, 2 tablets at 9am/12noon/3pm/6pm.  -Add carbidopa/levodopa 50/200 CR at bedtime  -Discussed extensively timing of levodopa as it relates to protein.  They were previously waiting 2 hours around the timing of the levodopa and having difficulty eating/taking pills.  Told them that they did not need to wait this long.  -Patient was a former Health and safety inspector.  I know it is frustrating for him, but I really want him to pick back up the guitar.  It is very good therapy for him.  -doing Parkinsons Disease dance with Parkinsons Disease project.  We discussed other programs in the area, including Parkinson's cycle and rock steady.  He met with my LCSW today.  2.  Sialorrhea  -This is commonly associated with PD.  We talked about treatments.  The patient is not a candidate for oral anticholinergic therapy because of increased risk of confusion and falls.  We discussed Botox (type A and B) and 1% atropine drops.  We discusssed that candy like lemon drops can help by stimulating mm of the oropharynx to induce swallowing.  He will think about this.  3.  Constipation  -discussed nature and pathophysiology and association with PD  -discussed importance of hydration.  Pt is to increase water intake.  Doesn't like to drink enough because of bladder incontinence  -pt is given a copy of the rancho recipe  -recommended daily colace  -recommended miralax prn  4.  Was told that he can follow-up here or with [facility] neurology.  He and his wife are going to think it over and will call back if they would like to continue to follow here.  If so, I would like to see him back within the next 6 months.  If not, he will follow-up at his previously scheduled appointment with Dr. Rexene Alberts.    Subjective:   Matthew [name] was seen today in the movement disorders clinic for  neurologic consultation at the request of Lawerance Cruel, MD.  The consultation is for second opinion for parkinsonism.  Wife present and supplements the history.  Patient has been seen [facility] neurology, Dr. Rexene Alberts, since March, 2021.  His presenting complaint was right arm/hand stiffness and slowness.  He noted issues with playing the guitar and buttoning his clothes.  At that visit, she ordered a DAT scan, which was completed on Oct 02, 2019.  This demonstrated decreased radiotracer uptake in the left and right striata.  Loss was so severe, that it was felt that a dopamine blocking drug was likely present according to radiology.  I did personally review that scan, and there was significant decreased uptake of the radiotracer bilaterally.  He was started on ropinirole.  The medication was not taken that long, but he was having "headaches, dizziness, falling asleep right away and poor bladder control."  This was on 0.25 mg.  It was discontinued.  He was started on levodopa on Oct 14, 2019.  Low-dose levodopa was not of benefit and in August, 2021, his dosage of levodopa was increased to carbidopa/levodopa 25/100, 2 tablets 3 times per day and then 2 tablets 4 times per day in April, 2022.  He also started the rotigotine patch that day.  He was last seen on October 20, 2020, at which point his rotigotine patch was increased from 2 mg to 4 mg daily.  He ultimately stopped this in July because of  feet swelling and ankle swelling and lightheadedness.  Current movement disorder meds: carbidopa/levodopa 25/100, 2 po qid (9am/1pm/5pm/10pm - goes to bed around midnight) - thinks that it is helpful - cannot tell when it wears off   Specific Symptoms:  Tremor: No. Family hx of similar:  Yes.  , maternal GF with Parkinsons Disease  Voice: weaker; just finished voice therapy Sleep:   Vivid Dreams:  some  Acting out dreams:  Yes.  ; has fallen out of the bed - last time about a year ago.  On a lower bed now and  sleeps in middle of king size bed now Wet Pillows: No. Postural symptoms:  Yes.    Falls?  Yes.  , 2 falls in 2022 - first in 07/2020 in garage when putting tools away and had to have stitches in ear; last 1 month ago in middle of night and got up from toilet and was near sink and fell backward and hit head on fiberglass tub and saw PA next day and told no scan needed.   Bradykinesia symptoms: shuffling gait, slow movements, drooling while awake, and difficulty getting out of a chair Loss of smell:  Yes.   (Not sure if due to Parkinsons Disease or covid, as worse when got covid) Loss of taste:  Yes.   Urinary Incontinence:  Yes.   (Due to prostate radiation/CA - has to wear undergarments since - has been to bladder PT) Difficulty Swallowing:  No. Per pt; wife states some trouble with pills occ Handwriting, micrographia: Yes.   Trouble with ADL's:  no  Trouble buttoning clothing: some trouble - just slow Depression:  some per pt and wife reports "not depressed, just not happy." Memory changes:  No. Hallucinations:  No. N/V:  No. Lightheaded:  Yes.    Syncope: No. Diplopia:  No. Dyskinesia:  No. Leg cramps:  yes, multiple times per week at night   PREVIOUS MEDICATIONS: Requip (headache, drowsiness, incontinence at only 0.25 mg dose); rotigotine (feet/leg swelling)  ALLERGIES:   Allergies  Allergen Reactions   Neupro [Rotigotine] Swelling   Other     Senior dose of flu shot, cause hear palpitations, anxious   Peanut-Containing Drug Products Other (See Comments)    Nasal congestion   Ropinirole Other (See Comments)    Headache drowsiness trouble urinating     CURRENT MEDICATIONS:  Current Outpatient Medications  Medication Instructions   carbidopa-levodopa (SINEMET IR) 25-100 MG tablet 2 tablets, Oral, 4 times daily, 2 pills 3 times a day   fluticasone (FLONASE) 50 MCG/ACT nasal spray 1-2 sprays, Each Nare, Daily PRN   lisinopril (ZESTRIL) 10 mg, Oral, Daily   loratadine  (CLARITIN) 10 mg, Oral, Daily   mirabegron ER (MYRBETRIQ) 50 mg, Oral, Daily   OVER THE COUNTER MEDICATION 4-6 tablets, Oral, 3 times daily, RESTORE GOLD 6 tablet in am, 6 tab at lunch and 4 tablet in evening    Objective:   VITALS:   Vitals:   01/05/21 1019  BP: 120/79  Pulse: (!) 58  SpO2: 98%  Weight: 165 lb 9.6 oz (75.1 kg)  Height: 5' 6" (1.676 m)    GEN:  The patient appears stated age and is in NAD. HEENT:  Normocephalic, atraumatic.  The mucous membranes are moist. The superficial temporal arteries are without ropiness or tenderness. CV:  brady.  regular Lungs:  CTAB Neck/HEME:  There are no carotid bruits bilaterally.  Neurological examination:  Orientation: The patient is alert and oriented x3.  Cranial nerves:  There is good facial symmetry. There is facial hypomimia with lips parted.  Extraocular muscles are intact. The visual fields are full to confrontational testing. The speech is fluent and clear but he is hypophonic. Soft palate rises symmetrically and there is no tongue deviation. Hearing is intact to conversational tone. Sensation: Sensation is intact to light throughout (facial, trunk, extremities). Vibration is intact at the bilateral big toe. There is no extinction with double simultaneous stimulation. There is no sensory dermatomal level identified. Motor: Strength is 5/5 in the bilateral upper and lower extremities.   Shoulder shrug is equal and symmetric.  There is no pronator drift. Deep tendon reflexes: Deep tendon reflexes are 2/4 at the bilateral biceps, triceps, brachioradialis, patella and achilles. Plantar responses are downgoing bilaterally.  Movement examination: Tone: There is mild to mod increased tone in the R>LUE and in the RLE Abnormal movements: none, even with distraction procedures Coordination:  There is no decremation with RAM's, with any form of RAMS, including alternating supination and pronation of the forearm, hand opening and closing,  finger taps, heel taps and toe taps, R>L Gait and Station: The patient has now difficulty arising out of a deep-seated chair without the use of the hands.  When asked to walk with me in the hall, he actually walks pretty well with good arm swing.  However, I did watch him walk into the room with my medical assistant and he had shorter steps and decreased arm swing on the right when he walked in.   I have reviewed and interpreted the following labs independently   Chemistry      Component Value Date/Time   NA 135 05/06/2019 0230   K 4.0 05/06/2019 0230   CL 104 05/06/2019 0230   CO2 24 05/06/2019 0230   BUN 22 05/06/2019 0230   CREATININE 0.98 05/06/2019 0230      Component Value Date/Time   CALCIUM 8.7 (L) 05/06/2019 0230      No results found for: TSH Lab Results  Component Value Date   WBC 9.6 05/06/2019   HGB 9.9 (L) 05/06/2019   HCT 28.9 (L) 05/06/2019   MCV 94.4 05/06/2019   PLT 141 (L) 05/06/2019     Total time spent on today's visit was 75 minutes, including both face-to-face time and nonface-to-face time.  Time included that spent on review of records (prior notes available to me/labs/imaging if pertinent), discussing treatment and goals, answering patient's questions and coordinating care.  Cc:  Lawerance Cruel, MD w

## 2021-01-05 ENCOUNTER — Encounter: Payer: Self-pay | Admitting: Neurology

## 2021-01-05 ENCOUNTER — Ambulatory Visit: Payer: PPO | Admitting: Neurology

## 2021-01-05 ENCOUNTER — Other Ambulatory Visit: Payer: Self-pay

## 2021-01-05 VITALS — BP 120/79 | HR 58 | Ht 66.0 in | Wt 165.6 lb

## 2021-01-05 DIAGNOSIS — K5901 Slow transit constipation: Secondary | ICD-10-CM | POA: Diagnosis not present

## 2021-01-05 DIAGNOSIS — G20A1 Parkinson's disease without dyskinesia, without mention of fluctuations: Secondary | ICD-10-CM

## 2021-01-05 DIAGNOSIS — K117 Disturbances of salivary secretion: Secondary | ICD-10-CM | POA: Diagnosis not present

## 2021-01-05 DIAGNOSIS — G20 Parkinson's disease: Secondary | ICD-10-CM | POA: Diagnosis not present

## 2021-01-05 MED ORDER — CARBIDOPA-LEVODOPA 25-100 MG PO TABS: 2.0000 | ORAL_TABLET | Freq: Four times a day (QID) | ORAL | 0 refills | Status: DC

## 2021-01-05 MED ORDER — CARBIDOPA-LEVODOPA ER 50-200 MG PO TBCR: 1.0000 | EXTENDED_RELEASE_TABLET | Freq: Every day | ORAL | 1 refills | Status: DC

## 2021-01-05 NOTE — Patient Instructions (Addendum)
Take carbidopa/levodopa 25/100, 2 tablets at 9am/12noon/3pm/6pm Add carbidopa/levodopa 50/200 CR at bedtime  Constipation and Parkinson's disease:  1.Rancho recipe for constipation in Parkinsons Disease:  -1 cup of unprocessed bran (need to get this at [employer], [employer] or similar type of store), 2 cups of applesauce in 1 cup of prune juice 2.  Increase fiber intake (Metamucil,vegetables) 3.  Regular, moderate exercise can be beneficial. 4.  Avoid medications causing constipation, such as medications like antacids with calcium or magnesium 5.  It's okay to take daily Miralax, and taper if stools become too loose or you experience diarrhea 6.  Stool softeners (Colace) can help with chronic constipation and I recommend you take this daily. 7.  Increase water intake.  You should be drinking 1/2 gallon of water a day as long as you have not been diagnosed with congestive heart failure or renal/kidney failure.  This is probably the single greatest thing that you can do to help your constipation.  Online Resources for Power over [employer]   [employer] Online Groups  Power over [employer] :   Power Over Parkinson's Patient Education Group will be Wednesday, August 10th-*Hybrid meting*- in person at [employer] location and via [place] at 2:30 pm.  PLEASE NOTE [place] over Parkinson's Meetings:  2nd Wednesdays of the month at 2 pm Contact Amy Marriott at [email or web address] if interested in participating in this online group Parkinson's Care Partners Group:    3rd Mondays, Contact Misty Paladino Atypical Parkinsonian Patient Group:   4th Wednesdays, [place] If you are interested in participating in these online groups with Misty, please contact her directly for how to join those meetings.  Her contact information is [email or web address].   [place]:   [email or web address] PD Health at Home continues:  Mindfulness Mondays, Expert Briefing Tuesdays, Wellness Wednesdays, Take Time Thursdays, Fitness Fridays -Listings for June 2022 are on the website Wellness Wednesday:  [place] for Parkinson's Disease through [place].  Wednesday, August 3rd at 1 pm. Upcoming Webinar:  Use it or Lose It-The Impact of Physical Activity in Parkinson's.  Wednesday, September 7th at 1 pm. Register for Armed forces operational officer) at [email or web address]  Please check out their website to sign up for emails and see their full online offerings  [place]:  [email or web address]  Upcoming Webinar[address] and You:  How to become a Barrister's clerk.  Thursday, August 18th at 12 pm  Check out additional information on their website to see their full online offerings  [place]:  [email or web address] Upcoming Webinar:  Depression, Anxiety, Mood and Parkinson's.   Tuesday, August 9th  at 4 pm. Care Partner Monthly Meetup.  With 04-16-1974 [place].  First Tuesday of each month, 2 pm Joy Breaks:  First Wednesday of each month, 2-3 pm. There will be art, doodling, making, crafting, listening, laughing, stories, and everything in between. No art experience necessary. No supplies required. Just show up for joy!  Register on their website. Check out additional information to Live Well Today on their website  Parkinson and Movement Disorders (PMD) Alliance:  www.pmdalliance.org NeuroLife Online:  Online Education Events Sign up for emails, which are sent weekly to give you updates on programming and online offerings  Parkinson's Association of the [place]:  www.[email or web address] Caring for Parkinson's, Caring for You Symposium, Saturday, September 10th.  [place], [place].  Symposium Registration - Parkinson Association of the [place] on online  support groups, education events, and  online exercises including Yoga, Parkinson's exercises and more-LOTS of information on links to PD resources and online events Virtual Support Group through Parkinson's Association of the [place]; next one is scheduled for Wednesday, August 3rd at 2 pm. (These are typically scheduled for the 1st Wednesday of the month at 2 pm).  Visit website for details.  Additional links for movement activities: PWR! Moves Classes at [place] RESUMED!  Wednesdays 10 and 11 am.  Contact Amy Marriott, PT [email or web address] or [phone] if interested Here is a link to the PWR!Moves classes on Zoom from [place] - Daily Mon-Sat at 10:00. Via Zoom, FREE and open to all.  There is also a link below via Facebook if you use that platform. [email or web address] Parkinson's Wellness Recovery (PWR! Moves)  www.[email or web address] Info on the PWR! Virtual Experience:  You will have access to our expertise through self-assessment, guided plans that start with the PD-specific fundamentals, educational content, tips, Q&A with an expert, and a growing Art therapist of PD-specific pre-recorded and live exercise classes of varying types and intensity - both physical and cognitive! If that is not enough, we offer 1:1 wellness consultations (in-person or virtual) to personalize your PWR! Research scientist (medical).  [place] Fridays:  As part of the PD Health @ Home program, this free video series focuses each week on one aspect of fitness designed to support people living with Parkinson's.  These weekly videos highlight the [place] recent fitness guidelines for people with Parkinson's disease.   [employer]ollywood[employer]dk Dance for PD website is offering free, live-stream classes throughout the week, as well as links to [employer] of classes:  [email or web address]/ Dance for Parkinson's Class:  [place].  Free offering for people with Parkinson's and care partners; virtual class.  For more information, contact [phone] or email Ruffin Frederick at [email or web address] Virtual dance and Pilates for Parkinson's classes: Click on the Community Tab> Parkinson's Movement Initiative Tab.  To register for classes and for more information, visit [email or web address] and click the "community" tab.    [employer] Parkinson's Cycling Classes  [employer]: 1pm on Fridays-Live classes at [employer] ([employer] at [email or web address] or [phone]) [employer]: Virtual Classes Mondays and Thursdays Jeanette Caprice classes Tuesday, Wednesday and Thursday (contact [place] at [email or web address]  or [phone])  [facility] Boxing Three levels of classes are offered Tuesdays and Thursdays:  10:30 am,  12 noon & 1:45 pm at [facility].  Active Stretching with Paula Compton Class starting in March, on Fridays To observe a class or for  more information, call [phone] or email [email or web address] Well-Spring Solutions: Online Caregiver Education Opportunities:  www.well-springsolutions.org/caregiver-education/caregiver-support-group.  You may also contact Vickki Muff at [email or web address] or [phone].   Well-Spring Navigator:  ([phone] program, a free service to help individuals and families through the journey of determining care for older adults.  The "Navigator" is a [employer], Education officer, museum, who will speak with a prospective client and/or loved ones to provide an assessment of the situation and a set of  recommendations for a personalized care plan -- all free of charge, and whether Well-Spring Solutions offers the needed service or not. If the need is not a service we provide, we are well-connected with reputable programs in town that we can refer you to.  www.well-springsolutions.org or to speak with the Navigator, call [phone].

## 2021-01-18 DIAGNOSIS — I10 Essential (primary) hypertension: Secondary | ICD-10-CM | POA: Diagnosis not present

## 2021-01-25 DIAGNOSIS — I10 Essential (primary) hypertension: Secondary | ICD-10-CM | POA: Diagnosis not present

## 2021-01-25 DIAGNOSIS — E7800 Pure hypercholesterolemia, unspecified: Secondary | ICD-10-CM | POA: Diagnosis not present

## 2021-01-25 DIAGNOSIS — M19049 Primary osteoarthritis, unspecified hand: Secondary | ICD-10-CM | POA: Diagnosis not present

## 2021-01-25 DIAGNOSIS — G20 Parkinson's disease: Secondary | ICD-10-CM | POA: Diagnosis not present

## 2021-02-11 DIAGNOSIS — C61 Malignant neoplasm of prostate: Secondary | ICD-10-CM | POA: Diagnosis not present

## 2021-02-17 DIAGNOSIS — I10 Essential (primary) hypertension: Secondary | ICD-10-CM | POA: Diagnosis not present

## 2021-02-23 ENCOUNTER — Ambulatory Visit: Payer: PPO | Admitting: Neurology

## 2021-03-14 DIAGNOSIS — E7800 Pure hypercholesterolemia, unspecified: Secondary | ICD-10-CM | POA: Diagnosis not present

## 2021-03-14 DIAGNOSIS — C61 Malignant neoplasm of prostate: Secondary | ICD-10-CM | POA: Diagnosis not present

## 2021-03-14 DIAGNOSIS — G20 Parkinson's disease: Secondary | ICD-10-CM | POA: Diagnosis not present

## 2021-03-14 DIAGNOSIS — M19049 Primary osteoarthritis, unspecified hand: Secondary | ICD-10-CM | POA: Diagnosis not present

## 2021-03-14 DIAGNOSIS — I10 Essential (primary) hypertension: Secondary | ICD-10-CM | POA: Diagnosis not present

## 2021-03-21 DIAGNOSIS — I10 Essential (primary) hypertension: Secondary | ICD-10-CM | POA: Diagnosis not present

## 2021-04-18 ENCOUNTER — Other Ambulatory Visit: Payer: Self-pay | Admitting: Neurology

## 2021-04-20 DIAGNOSIS — I10 Essential (primary) hypertension: Secondary | ICD-10-CM | POA: Diagnosis not present

## 2021-04-23 DIAGNOSIS — E7800 Pure hypercholesterolemia, unspecified: Secondary | ICD-10-CM | POA: Diagnosis not present

## 2021-04-23 DIAGNOSIS — G20 Parkinson's disease: Secondary | ICD-10-CM | POA: Diagnosis not present

## 2021-04-23 DIAGNOSIS — I10 Essential (primary) hypertension: Secondary | ICD-10-CM | POA: Diagnosis not present

## 2021-04-23 DIAGNOSIS — M19049 Primary osteoarthritis, unspecified hand: Secondary | ICD-10-CM | POA: Diagnosis not present

## 2021-04-27 ENCOUNTER — Telehealth: Payer: Self-pay | Admitting: Neurology

## 2021-04-27 NOTE — Telephone Encounter (Signed)
Called patients wife [name] back to give her your recommendations. She did give me additional information that the patient is struggling to talk,he is having trouble with memory, unable to dress himself, has pain in arm and back he is not driving at all.Patient taking 25/100 9 am /12 pm / 3pm / 6pm and taking the 50/200 @ 11 . [name] the patients wife did also tell me they are sticking to his schedule but if he is ever a few min late on his medication he becomes incredibly stiff.

## 2021-04-27 NOTE — Telephone Encounter (Signed)
Per patient wife patient Is hardly moving and cant raise his arms, the range of movement is getting bad, we changed the medication and it help for a short while but now it seems like it is not working  patient has appt on 05-26-21 with Tat   Please call

## 2021-04-27 NOTE — Telephone Encounter (Signed)
Spoke to patients wife she said he is scheduled to start PT again this next year. She said it did help for a little while when the SL was adjusted but patient appears to be very stiff with little range of motion and was looking for any recommendation before the follow up appt to help

## 2021-04-28 ENCOUNTER — Other Ambulatory Visit: Payer: Self-pay

## 2021-04-28 NOTE — Telephone Encounter (Signed)
Spoke to patients wife and she has agreed to cal lPCP and get patient checked before upcoming appointment

## 2021-05-05 DIAGNOSIS — C61 Malignant neoplasm of prostate: Secondary | ICD-10-CM | POA: Diagnosis not present

## 2021-05-05 DIAGNOSIS — R8271 Bacteriuria: Secondary | ICD-10-CM | POA: Diagnosis not present

## 2021-05-12 DIAGNOSIS — C61 Malignant neoplasm of prostate: Secondary | ICD-10-CM | POA: Diagnosis not present

## 2021-05-12 DIAGNOSIS — N3946 Mixed incontinence: Secondary | ICD-10-CM | POA: Diagnosis not present

## 2021-05-20 DIAGNOSIS — I10 Essential (primary) hypertension: Secondary | ICD-10-CM | POA: Diagnosis not present

## 2021-05-24 DIAGNOSIS — M7651 Patellar tendinitis, right knee: Secondary | ICD-10-CM | POA: Diagnosis not present

## 2021-05-24 DIAGNOSIS — M25561 Pain in right knee: Secondary | ICD-10-CM | POA: Diagnosis not present

## 2021-05-24 NOTE — Progress Notes (Signed)
Assessment/Plan:   1.  Parkinsons Disease  -Continue carbidopa/levodopa 25/100, 2 tablets at 9 AM/noon/3 PM/6 PM  -Continue carbidopa/levodopa 50/200 CR at bedtime  -we discussed trying pramipexole vs entacapone.  He wants to try pramipexole and work to 0.5 mg tid.  R/B/SE were discussed.  The opportunity to ask questions was given and they were answered to the best of my ability.  The patient expressed understanding and willingness to follow the outlined treatment protocols.  -referrals for PT/OT/ST  -arm pain not Parkinsons Disease but is due to rotator cuff likely  -discussed walking sticks/ski poles/walkers  2.  Sialorrhea  -This is commonly associated with PD.  We talked about treatments.  The patient is not a candidate for oral anticholinergic therapy because of increased risk of confusion and falls.  We discussed Botox (type A and B) and 1% atropine drops.  We discusssed that candy like lemon drops can help by stimulating mm of the oropharynx to induce swallowing.   Subjective:   Matthew [name] was seen today in follow up for Parkinsons disease.  My previous records were reviewed prior to todays visit as well as outside records available to me.  His medication was adjusted last visit, with adding levodopa at bedtime.  Wife did call me a few weeks ago stating that the patient was getting more stiff.  Encouraged them to do his physical therapy and follow-up with primary care just to make sure that nothing else was going on physically that was causing difficulty with movement.  He is not exercising.  He has appt in a month at neurorehab for re-evaluation.  No falls since last visit.  No hallucinations.  Said trouble raising arms over head due to pain.  Current prescribed movement disorder medications: carbidopa/levodopa 25/100, 2 tablets at 9am/12noon/3pm/6pm. Carbidopa/levodopa 50/200 CR at bed (added last visit)   PREVIOUS MEDICATIONS: Requip (headache, drowsiness, incontinence at  only 0.25 mg dose); rotigotine (feet/leg swelling)  ALLERGIES:   Allergies  Allergen Reactions   Other     Senior dose of flu shot, cause hear palpitations, anxious   Peanut-Containing Drug Products Other (See Comments)    Nasal congestion   Ropinirole Other (See Comments)    Headache drowsiness trouble urinating     CURRENT MEDICATIONS:  Outpatient Encounter Medications as of 05/26/2021  Medication Sig   Ascorbic Acid (VITAMIN C) 1000 MG tablet Take 1,000 mg by mouth daily.   calcium carbonate (OS-CAL) 1250 (500 Ca) MG chewable tablet Chew 1 tablet by mouth daily.   carbidopa-levodopa (SINEMET CR) 50-200 MG tablet TAKE ONE TABLET BY MOUTH EVERYDAY AT BEDTIME   carbidopa-levodopa (SINEMET IR) 25-100 MG tablet Take 2 tablets by mouth 4 (four) times daily. 2 tablets at 9am/12noon/3pm/6pm   docusate sodium (COLACE) 100 MG capsule Take 100 mg by mouth 2 (two) times daily.   fluticasone (FLONASE) 50 MCG/ACT nasal spray Place 1-2 sprays into both nostrils daily as needed for allergies or rhinitis.   lisinopril (ZESTRIL) 10 MG tablet Take 10 mg by mouth daily.   loratadine (CLARITIN) 10 MG tablet Take 10 mg by mouth daily.   mirabegron ER (MYRBETRIQ) 50 MG TB24 tablet Take 50 mg by mouth daily.   Multiple Vitamin (MULTIVITAMIN) tablet Take 1 tablet by mouth daily.   OVER THE COUNTER MEDICATION Take 4-6 tablets by mouth in the morning, at noon, and at bedtime. RESTORE GOLD 6 tablet in am, 6 tab at lunch and 4 tablet in evening   vitamin B-12 (CYANOCOBALAMIN) 500  MCG tablet Take 500 mcg by mouth daily.   zinc gluconate 50 MG tablet Take 50 mg by mouth daily.   No facility-administered encounter medications on file as of 05/26/2021.    Objective:   PHYSICAL EXAMINATION:    VITALS:   Vitals:   05/26/21 0824  BP: 119/78  Pulse: 67  SpO2: 98%  Weight: 170 lb (77.1 kg)  Height: 5\' 4"  (1.626 m)    GEN:  The patient appears stated age and is in NAD. HEENT:  Normocephalic, atraumatic.   The mucous membranes are moist. The superficial temporal arteries are without ropiness or tenderness. CV:  RRR Lungs:  CTAB Neck/HEME:  There are no carotid bruits bilaterally.  Neurological examination:  Orientation: The patient is alert and oriented x3. Cranial nerves: There is good facial symmetry with facial hypomimia. The speech is fluent and clear. Soft palate rises symmetrically and there is no tongue deviation. Hearing is intact to conversational tone. Sensation: Sensation is intact to light touch throughout Motor: Strength is at least antigravity x4.  Movement examination: Tone: There is mild increased tone in the LUE (just took med 5 min ago) Abnormal movements: none, even with distraction procedures Coordination:  There is mild decremation with RAMs, LE>UE Gait and Station: The patient has milddifficulty arising out of a deep-seated chair without the use of the hands.  When asked to walk with me in the hall, he actually walks pretty well with good arm swing (wife states showing off)  I have reviewed and interpreted the following labs independently    Chemistry      Component Value Date/Time   NA 135 05/06/2019 0230   K 4.0 05/06/2019 0230   CL 104 05/06/2019 0230   CO2 24 05/06/2019 0230   BUN 22 05/06/2019 0230   CREATININE 0.98 05/06/2019 0230      Component Value Date/Time   CALCIUM 8.7 (L) 05/06/2019 0230       Lab Results  Component Value Date   WBC 9.6 05/06/2019   HGB 9.9 (L) 05/06/2019   HCT 28.9 (L) 05/06/2019   MCV 94.4 05/06/2019   PLT 141 (L) 05/06/2019    No results found for: TSH   Total time spent on today's visit was 30 minutes, including both face-to-face time and nonface-to-face time.  Time included that spent on review of records (prior notes available to me/labs/imaging if pertinent), discussing treatment and goals, answering patient's questions and coordinating care.  Cc:  Lawerance Cruel, MD

## 2021-05-26 ENCOUNTER — Ambulatory Visit: Payer: PPO | Admitting: Neurology

## 2021-05-26 ENCOUNTER — Encounter: Payer: Self-pay | Admitting: Neurology

## 2021-05-26 ENCOUNTER — Other Ambulatory Visit: Payer: Self-pay

## 2021-05-26 VITALS — BP 119/78 | HR 67 | Ht 64.0 in | Wt 170.0 lb

## 2021-05-26 DIAGNOSIS — G20 Parkinson's disease: Secondary | ICD-10-CM | POA: Diagnosis not present

## 2021-05-26 DIAGNOSIS — G20A1 Parkinson's disease without dyskinesia, without mention of fluctuations: Secondary | ICD-10-CM

## 2021-05-26 MED ORDER — PRAMIPEXOLE DIHYDROCHLORIDE 0.125 MG PO TABS: ORAL_TABLET | ORAL | 0 refills | Status: DC

## 2021-05-26 MED ORDER — PRAMIPEXOLE DIHYDROCHLORIDE 0.5 MG PO TABS: 0.5000 mg | ORAL_TABLET | Freq: Three times a day (TID) | ORAL | 1 refills | Status: DC

## 2021-05-26 NOTE — Patient Instructions (Signed)
Start mirapex (pramipexole) as follows:  0.125 mg - 1 tablet three times per day for a week, then 2 tablets three times per day for a week and then fill the 0.5 mg tablet and take that, 1 pill three times per day   You can let us know if you want to trial botox for the drooling

## 2021-06-01 ENCOUNTER — Ambulatory Visit: Payer: PPO

## 2021-06-01 ENCOUNTER — Ambulatory Visit: Payer: PPO | Admitting: Occupational Therapy

## 2021-06-01 ENCOUNTER — Encounter: Payer: Self-pay | Admitting: Physical Therapy

## 2021-06-01 ENCOUNTER — Ambulatory Visit: Payer: PPO | Attending: Neurology | Admitting: Physical Therapy

## 2021-06-01 ENCOUNTER — Other Ambulatory Visit: Payer: Self-pay

## 2021-06-01 DIAGNOSIS — M6281 Muscle weakness (generalized): Secondary | ICD-10-CM | POA: Insufficient documentation

## 2021-06-01 DIAGNOSIS — R2681 Unsteadiness on feet: Secondary | ICD-10-CM | POA: Diagnosis not present

## 2021-06-01 DIAGNOSIS — R1310 Dysphagia, unspecified: Secondary | ICD-10-CM

## 2021-06-01 DIAGNOSIS — R278 Other lack of coordination: Secondary | ICD-10-CM | POA: Insufficient documentation

## 2021-06-01 DIAGNOSIS — R4701 Aphasia: Secondary | ICD-10-CM | POA: Diagnosis not present

## 2021-06-01 DIAGNOSIS — R29898 Other symptoms and signs involving the musculoskeletal system: Secondary | ICD-10-CM | POA: Diagnosis not present

## 2021-06-01 DIAGNOSIS — R29818 Other symptoms and signs involving the nervous system: Secondary | ICD-10-CM | POA: Insufficient documentation

## 2021-06-01 DIAGNOSIS — R2689 Other abnormalities of gait and mobility: Secondary | ICD-10-CM | POA: Insufficient documentation

## 2021-06-01 DIAGNOSIS — R41841 Cognitive communication deficit: Secondary | ICD-10-CM | POA: Diagnosis not present

## 2021-06-01 DIAGNOSIS — R471 Dysarthria and anarthria: Secondary | ICD-10-CM

## 2021-06-01 DIAGNOSIS — G20 Parkinson's disease: Secondary | ICD-10-CM | POA: Insufficient documentation

## 2021-06-01 DIAGNOSIS — R293 Abnormal posture: Secondary | ICD-10-CM | POA: Diagnosis present

## 2021-06-01 NOTE — Patient Instructions (Signed)
° °  Get back to doing your loud "ah" and your everyday sentences! TWICE every day. You will want to add an alarm into your phone to help you remember this.

## 2021-06-01 NOTE — Therapy (Signed)
[facility] [place] [address] [place], [place], [address] Phone: [phone]   Fax:  [phone]  Speech Language Pathology Evaluation  Patient Details  Name: Matthew [name] MRN: [id number] Date of Birth: 1950/08/09 Referring Provider (SLP): Alonza Bogus, DO   Encounter Date: 06/01/2021   End of Session - 06/01/21 1606     Visit Number 1    Number of Visits 17    Date for SLP Re-Evaluation 08/05/21    SLP Start Time 1450    SLP Stop Time  4401    SLP Time Calculation (min) 40 min    Activity Tolerance Patient tolerated treatment well             Past Medical History:  Diagnosis Date   Anxiety    Arthritis    GERD (gastroesophageal reflux disease)    occ remote history   Hypertension    Parkinson disease ([place])    Prostate cancer ([place])    Right knee pain    questionable meniscal tear   Seasonal allergies     Past Surgical History:  Procedure Laterality Date   COLONOSCOPY     Leg Laceration Repair  Left    Age 41   PELVIC LYMPH NODE DISSECTION Bilateral 03/22/2018   Procedure: PELVIC LYMPH NODE DISSECTION;  Surgeon: Ceasar Mons, MD;  Location: [facility];  Service: Urology;  Laterality: Bilateral;   PROSTATE BIOPSY     ROBOT ASSISTED LAPAROSCOPIC RADICAL PROSTATECTOMY N/A 03/22/2018   Procedure: XI ROBOTIC ASSISTED LAPAROSCOPIC PROSTATECTOMY;  Surgeon: Ceasar Mons, MD;  Location: [facility];  Service: Urology;  Laterality: N/A;   TOTAL KNEE ARTHROPLASTY Right 05/05/2019   Procedure: RIGHT TOTAL KNEE ARTHROPLASTY;  Surgeon: Frederik Pear, MD;  Location: [facility];  Service: Orthopedics;  Laterality: Right;    There were no vitals filed for this visit.       SLP Evaluation [facility] - 06/01/21 1540       SLP Visit Information   SLP Received On 06/01/21    Referring Provider (SLP) Alonza Bogus, DO    Medical Diagnosis Parkinson's Disease      Subjective   Patient/Family Stated Goal Improve loudness       Pain Assessment   Currently in Pain? No/denies      General Information   HPI Pt is well-known by this SLP from previous plans of care. Last POC ended in July 2022 when pt spoke in and out of the ST room at normal volume for close to a combined 30 minutes. Wife Matthew [name] states she asks pt to repeat multiple times per day.      Balance Screen   Has the patient fallen in the past 6 months Yes      Prior Functional Status   Cognitive/Linguistic Baseline Within functional limits    Type of Home House     Lives With Spouse    Available Support Family    Vocation Retired      Associate Professor   Overall Cognitive Status Impaired/Different from baseline    Area of Impairment --   Slower overall processing     Auditory Comprehension   Overall Auditory Comprehension Appears within functional limits for tasks assessed      Verbal Expression   Overall Verbal Expression Appears within functional limits for tasks assessed      Oral Motor/Sensory Function   Overall Oral Motor/Sensory Function Impaired    Labial Coordination Reduced    Lingual Coordination Reduced  Motor Speech   Overall Motor Speech Impaired    Respiration --   chest/clavicular breather   Phonation Low vocal intensity   average mid-60s dB, with some rare splinter utterances in upper 60s low 70s in 10 minutes conversation; maintained WNL for ~30 seconds into conversation   Articulation Within functional limitis    Intelligibility Intelligible    Effective Techniques Increased vocal intensity   in 5 minutes simple conversation following 6 reps loud /a/, pt incr'd loudness to average upper 60s dB with some occasional splinter utterances into low 70s dB; volume was WNL/near-WNL 90 seconds into conversation                            SLP Education - 06/01/21 1605     Education Details loud /a/ rationale, need to put alarm in phone to recall to complete loud "ah"    Person(s) Educated Patient    Methods  Explanation    Comprehension Verbalized understanding              SLP Short Term Goals - 06/01/21 1621       SLP SHORT TERM GOAL #1   Title pt will produce /a/ with average upper 80s dB at 30 cm over 3 sessions    Time 4    Period Weeks    Status New    Target Date 07/01/21      SLP SHORT TERM GOAL #2   Title pt will demo abdominal breathing with sentence responses 85% accuracy x 3 sessions    Time 4    Period Weeks    Status New    Target Date 07/01/21      SLP SHORT TERM GOAL #3   Title pt will generate 5 minutes simple conversation with low 70s dB average in 3 sessions    Time 4    Period Weeks    Status New    Target Date 07/01/21      SLP SHORT TERM GOAL #4   Title pt will undergo insturmental swallow assessment (MBSS or FEES) if clinically indicated    Time 4    Period Weeks    Status New    Target Date 07/01/21              SLP Long Term Goals - 06/01/21 1623       SLP LONG TERM GOAL #1   Title pt will use abdominal breathing >80% of the time during 10 minute simple conversation in 3 sessions    Time 8    Period Weeks    Status New    Target Date 08/05/21      SLP LONG TERM GOAL #2   Title pt will maintain upper 60s dB average in 10 minute simple conversation x 3 sessions    Time 8    Period Weeks    Status New    Target Date 08/05/21      SLP LONG TERM GOAL #3   Title pt will report fewer requests to repeat himself than prior to ST in 3 sessions    Time 8    Period Weeks    Status New    Target Date 08/05/21      SLP LONG TERM GOAL #4   Title pt will produce average upper 60s dB in 10 minutes simple to mod complex conversation in 3 sessions    Time 8    Period Weeks  Status New    Target Date 08/05/21      SLP LONG TERM GOAL #5   Title pt will score better on a speech QOL questionairre than in the first 1-2 ST sessions    Time 8    Period Weeks    Status New    Target Date 08/05/21              Plan - 06/01/21 1613      Clinical Impression Statement Matthew [name]") presents today with 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 "I could tell I was too soft," pt stated. After 6 reps of loud /a/ pt increased his conversational loudness in 10 minutes conversation to upper 60s dB average, and for the first 90 seconds produced mostly-WNL volume. SLP believes pt will benefit from skilled ST targeting speech loudness and clarity. Additionally, pt noted to clear throat consistently with thin liquids. Pt may require an instrumental swallow evaluation during this therapy course and DO signature on this plan of care will be understood to mean that referral for MBSS or FEES is provided.    Speech Therapy Frequency 2x / week    Duration 8 weeks    Treatment/Interventions Aspiration precaution training;Language facilitation;Environmental controls;Cueing hierarchy;SLP instruction and feedback;Compensatory techniques;Functional tasks;Compensatory strategies;Patient/family education;Diet toleration management by SLP;Other (comment)    Potential to Achieve Goals Good    SLP Home Exercise Plan provided    Consulted and Agree with Plan of Care Patient             Patient will benefit from skilled therapeutic intervention in order to improve the following deficits and impairments:   Dysarthria and anarthria  Dysphagia, unspecified type  Cognitive communication deficit    Problem List Patient Active Problem List   Diagnosis Date Noted   S/P TKR (total knee replacement), right 05/05/2019   Osteoarthritis of right knee 05/02/2019   Prostate cancer ([place]) 03/22/2018   Malignant neoplasm of prostate ([place]) 02/27/2018    [place], [place] 06/01/2021, 4:29 PM  [facility] [address] [place], [place],  [address] Phone: [phone]   Fax:  [phone]  Name: Matthew [name] MRN: [id number] Date of Birth: [birth year]

## 2021-06-01 NOTE — Patient Instructions (Signed)
Access Code: [email or web address] URL: [email or web address] Date: 06/01/2021 Prepared by: [facility]  Exercises Sit to Stand - 1 x daily - 5 x weekly - 2 sets - 5 reps Seated Hamstring Stretch - 1-2 x daily - 7 x weekly - 1 sets - 3 reps - 30 sec hold Seated Ankle Dorsiflexion AROM - 1-2 x daily - 7 x weekly - 2 sets - 10 reps

## 2021-06-01 NOTE — Therapy (Signed)
[facility] [address] [place], [place], [address] Phone: [phone]   Fax:  [phone]  Physical Therapy Evaluation  Patient Details  Name: Matthew [name] MRN: [id number] Date of Birth: October 30, 1950 Referring Provider (PT): Tat, Wells Guiles   Encounter Date: 06/01/2021   PT End of Session - 06/01/21 1650     Visit Number 1    Number of Visits 17    Date for PT Re-Evaluation 07/29/21    Authorization Type [employer]    Progress Note Due on Visit 10    PT Start Time 6962    PT Stop Time 1630    PT Time Calculation (min) 55 min    Activity Tolerance Patient tolerated treatment well    Behavior During Therapy [facility] for tasks assessed/performed;Flat affect             Past Medical History:  Diagnosis Date   Anxiety    Arthritis    GERD (gastroesophageal reflux disease)    occ remote history   Hypertension    Parkinson disease ([place])    Prostate cancer ([place])    Right knee pain    questionable meniscal tear   Seasonal allergies     Past Surgical History:  Procedure Laterality Date   COLONOSCOPY     Leg Laceration Repair  Left    Age 71   PELVIC LYMPH NODE DISSECTION Bilateral 03/22/2018   Procedure: PELVIC LYMPH NODE DISSECTION;  Surgeon: Ceasar Mons, MD;  Location: [facility];  Service: Urology;  Laterality: Bilateral;   PROSTATE BIOPSY     ROBOT ASSISTED LAPAROSCOPIC RADICAL PROSTATECTOMY N/A 03/22/2018   Procedure: XI ROBOTIC ASSISTED LAPAROSCOPIC PROSTATECTOMY;  Surgeon: Ceasar Mons, MD;  Location: [facility];  Service: Urology;  Laterality: N/A;   TOTAL KNEE ARTHROPLASTY Right 05/05/2019   Procedure: RIGHT TOTAL KNEE ARTHROPLASTY;  Surgeon: Frederik Pear, MD;  Location: [facility];  Service: Orthopedics;  Laterality: Right;    There were no vitals filed for this visit.    Subjective Assessment - 06/01/21 1536     Subjective Pt reports seeing Dr. Carles Collet last week and she added Mirapex  (haven't started taking yet as wife was out of town).  Plans to start tomorrow.  Have had a few falls; last fall was Monday.  Have had 3 falls in past 3 months.  One fall getting up from toilet and lost balance backwards into tub.  Most recent fall was picking up a package, trying to turn, and fell backwards. Takes a long time to perform any activities-just moving slow, so makes him not to do it.    Patient is accompained by: Family member   wife   Pertinent History Shoulder pain and back pain (?arthritis); saw orthopedist to f/u wtih R knee (pain from TKR) and mild tendonitis    Patient Stated Goals Pt's goals for therapy are to move normally and move better.  Wife-not fall.    Currently in Pain? No/denies   No pain at eval; but he has had Advil   Pain Score 5     Pain Location Shoulder    Pain Orientation Right    Pain Descriptors / Indicators Aching;Tightness    Pain Type Chronic pain    Pain Onset More than a month ago    Pain Frequency Intermittent    Aggravating Factors  lifting arm overhead    Pain Relieving Factors Advil    Effect of Pain on Daily Activities Pt to  see OT for eval                [facility] PT Assessment - 06/01/21 0001       Assessment   Medical Diagnosis Parkinson's disease    Referring Provider (PT) Tat, Wells Guiles    Onset Date/Surgical Date 05/26/21   MD order   Hand Dominance Right    Prior Therapy OT, PT, ST      Precautions   Precautions Fall      Balance Screen   Has the patient fallen in the past 6 months Yes    How many times? 2-3    Has the patient had a decrease in activity level because of a fear of falling?  No    Is the patient reluctant to leave their home because of a fear of falling?  No      Home Social worker Private residence    Living Arrangements Spouse/significant other    Available Help at Discharge Family    Type of [place] to enter    Entrance Stairs-Number of Steps 2-3    [place] Two level;Able to live on main level with bedroom/bathroom    [place] - 2 wheels;Cane - single point;Walker - 4 wheels;Grab bars - toilet;Grab bars - tub/shower;Tub bench   Walking poles     Prior Function   Level of Independence Independent    Vocation Retired    Leisure Enjoys being with guy friends playing horsehoes and darts; spending time with grandchildren, enjoys Proofreader, live music.  Sometimes walks in the neighborhood.  Dance Project Class online and drumming class.  Does previous HEP from last bout of therapy.      Observation/Other Assessments   Focus on Therapeutic Outcomes (FOTO)  NA      Posture/Postural Control   Posture/Postural Control Postural limitations    Postural Limitations Rounded Shoulders;Forward head;Posterior pelvic tilt;Flexed trunk      Tone   Assessment Location Right Lower Extremity;Left Lower Extremity      ROM / Strength   AROM / PROM / Strength AROM;Strength      AROM   Overall AROM  Deficits    Overall AROM Comments R knee extension lacks 24 degrees; L knee extension -26 degrees (in sitting)      Strength   Overall Strength Deficits    Strength Assessment Site Hip;Knee;Ankle    Right/Left Hip Right;Left    Right Hip Flexion 4/5    Left Hip Flexion 4/5    Right/Left Knee Right;Left    Right Knee Flexion 4/5    Right Knee Extension 4/5    Left Knee Flexion 4/5    Left Knee Extension 4/5    Right/Left Ankle Right;Left    Right Ankle Dorsiflexion 4/5    Left Ankle Dorsiflexion 4/5      Transfers   Transfers Sit to Stand;Stand to Sit    Sit to Stand 5: Supervision;Without upper extremity assist;From chair/3-in-1    Five time sit to stand comments  11.66    Stand to Sit 5: Supervision;Without upper extremity assist;To chair/3-in-1    Comments More difficulty from lower surfaces; reports initial sensation of being off-balance upon first standing      Ambulation/Gait    Ambulation/Gait Yes    Ambulation/Gait Assistance 5: Supervision    Ambulation Distance (Feet) 90 Feet    Assistive device None  Gait Pattern Step-through pattern;Decreased arm swing - right;Decreased step length - right;Decreased step length - left;Decreased dorsiflexion - right;Decreased dorsiflexion - left;Trunk flexed;Narrow base of support;Poor foot clearance - left;Poor foot clearance - right    Ambulation Surface Level;Indoor    Gait velocity 11.07 sec = 2.96 ft/sec      Standardized Balance Assessment   Standardized Balance Assessment Timed Up and Go Test;Mini-BESTest      Mini-BESTest   Sit To Stand Normal: Comes to stand without use of hands and stabilizes independently.    Rise to Toes Normal: Stable for 3 s with maximum height.    Stand on one leg (left) Moderate: < 20 s   2.59, 5.13   Stand on one leg (right) Moderate: < 20 s   13.63, 7.44   Stand on one leg - lowest score 1    Compensatory Stepping Correction - Forward No step, OR would fall if not caught, OR falls spontaneously.    Compensatory Stepping Correction - Backward No step, OR would fall if not caught, OR falls spontaneously.    Compensatory Stepping Correction - Left Lateral Moderate: Several steps to recover equilibrium    Compensatory Stepping Correction - Right Lateral Severe:  Falls, or cannot step    Stepping Corredtion Lateral - lowest score 0    Stance - Feet together, eyes open, firm surface  Normal: 30s    Stance - Feet together, eyes closed, foam surface  Normal: 30s    Incline - Eyes Closed Moderate: Stands independently < 30s OR aligns with surface   6 sec   Change in Gait Speed Moderate: Unable to change walking speed or signs of imbalance    Walk with head turns - Horizontal Moderate: performs head turns with reduction in gait speed.    Walk with pivot turns Severe: Cannot turn with feet close at any speed without imbalance.   4.59 sec   Step over obstacles Moderate: Steps over box but touches box  OR displays cautious behavior by slowing gait.    Timed UP & GO with Dual Task Moderate: Dual Task affects either counting OR walking (>10%) when compared to the TUG without Dual Task.    Mini-BEST total score 14      Timed Up and Go Test   Normal TUG (seconds) 16.19    Manual TUG (seconds) 15.53    Cognitive TUG (seconds) 18.44    TUG Comments Scores >13.5-15 sec indicates increased fall risk      RLE Tone   RLE Tone Mild   greater than left     LLE Tone   LLE Tone Mild                        Objective measurements completed on examination: See above findings.                PT Education - 06/01/21 1649     Education Details Eval results, POC; initiated HEP-see instructions    Person(s) Educated Patient    Methods Explanation;Demonstration;Handout    Comprehension Verbalized understanding;Returned demonstration              PT Short Term Goals - 06/01/21 1703       PT SHORT TERM GOAL #1   Title Pt will be indepedent with Parkinson's specific HEP to address balance, transfers, gait for improved overall mobility.  TARGET 07/01/2021    Time 4    Period Weeks    Status  New      PT SHORT TERM GOAL #2   Title Pt will improve 5x sit<>stand to less than or equal to 10 sec to demonstrate improved functional strength and transfer efficiency.    Baseline 11.66 sec    Time 4    Period Weeks    Status New      PT SHORT TERM GOAL #3   Title Pt will improve TUG score to less than or equal to 13.5 sec for decreased fall risk.    Baseline 16.19 sec    Time 4    Period Weeks    Status New      PT SHORT TERM GOAL #4   Title Pt will perform push and release test in posterior direction in 2 steps or less for improved balance recovery.    Baseline 4 steps, would fall if unaided    Time 4    Period Weeks    Status New               PT Long Term Goals - 06/01/21 1705       PT LONG TERM GOAL #1   Title Pt will be independent with progression  of HEP for Parkinson's related deficits.  TARGET 07/29/2021    Time 8    Period Weeks    Status New      PT LONG TERM GOAL #2   Title Pt will perform at least 8 of 10 reps of sit<>stand from <18" surfaces, for improved transfer efficiency and safety.    Time 8    Period Weeks    Status New      PT LONG TERM GOAL #3   Title Pt will improve MiniBESTest score to at least 21/28 for decreased fall risk.    Baseline 14/28    Time 8    Period Weeks    Status New      PT LONG TERM GOAL #4   Title Pt will improve TUG cognitive score to less than or equal to 15 sec for improved dual tasking with gait.    Baseline 18.44 sec    Time 8    Period Weeks    Status New      PT LONG TERM GOAL #5   Title Pt will verbalize plans for continued community fitness upon d/c from PT.    Time 8    Period Weeks    Status New                    Plan - 06/01/21 1652     Clinical Impression Statement Pt is a 71 year old male who presents to OPPT with history of Parkinson's disease, with noted functional mobility decline in past few months.  He saw Dr. Carles Collet earlier than anticipated (05/26/2021) and she recommended return to [facility].  She also added dopamine agonist medication to his routine, which he plans to start taking tomorrow.  Pt presents today with decreased balance, decreased functional strength, decreased flexibility, abnormal posture, postural instability, decreased timing, coordination of gait.  He has had at least 3 falls in past 6 months and is at increased risk of falls per MiniBESTest and TUG scores.  Scores on all functional measures have slowed/declined since last bout of PT, ending in July 2022.  He enjoys getting together with friends and participating in weekly dance project/monthly drumming classes.  He will benefit from skilled PT to address the above stated deficits to decrease fall risk and  improve overall functional mobility.    Personal Factors and Comorbidities Comorbidity 3+     Comorbidities anxiety, arthritis, GERD, HTN, R TKR    Examination-Activity Limitations Locomotion Level;Transfers;Bend;Squat;Stairs;Stand    Examination-Participation Restrictions Community Activity;Other   community fitness, horseshoes/games with friends   Stability/Clinical Decision Making Evolving/Moderate complexity    Clinical Decision Making Moderate    Rehab Potential Good    PT Frequency 2x / week    PT Duration 8 weeks   plus eval   PT Treatment/Interventions ADLs/Self Care Home Management;Gait training;Functional mobility training;Therapeutic activities;Therapeutic exercise;Balance training;Neuromuscular re-education;DME Instruction;Manual techniques;Passive range of motion;Patient/family education    PT Next Visit Plan Review initial HEP; update HEP to incorporate PWR! Moves, sit<>stand transfers, balance; gait training with appropriate assistive device    Consulted and Agree with Plan of Care Patient             Patient will benefit from skilled therapeutic intervention in order to improve the following deficits and impairments:  Abnormal gait, Difficulty walking, Decreased balance, Impaired flexibility, Postural dysfunction, Decreased strength, Decreased mobility  Visit Diagnosis: Other abnormalities of gait and mobility  Unsteadiness on feet  Abnormal posture  Muscle weakness (generalized)  Other symptoms and signs involving the nervous system     Problem List Patient Active Problem List   Diagnosis Date Noted   S/P TKR (total knee replacement), right 05/05/2019   Osteoarthritis of right knee 05/02/2019   Prostate cancer ([place]) 03/22/2018   Malignant neoplasm of prostate ([place]) 02/27/2018    Matthew Woehl W., PT 06/01/2021, 5:07 PM  [facility] [address] [place], [place], [address] Phone: [phone]   Fax:  [phone]  Name: Matthew [name] MRN: [id number] Date of Birth: [birth year]

## 2021-06-03 ENCOUNTER — Ambulatory Visit: Payer: PPO | Admitting: Occupational Therapy

## 2021-06-03 ENCOUNTER — Encounter: Payer: Self-pay | Admitting: Occupational Therapy

## 2021-06-03 ENCOUNTER — Other Ambulatory Visit: Payer: Self-pay

## 2021-06-03 DIAGNOSIS — R29818 Other symptoms and signs involving the nervous system: Secondary | ICD-10-CM

## 2021-06-03 DIAGNOSIS — R2681 Unsteadiness on feet: Secondary | ICD-10-CM

## 2021-06-03 DIAGNOSIS — R2689 Other abnormalities of gait and mobility: Secondary | ICD-10-CM

## 2021-06-03 DIAGNOSIS — M6281 Muscle weakness (generalized): Secondary | ICD-10-CM

## 2021-06-03 DIAGNOSIS — R29898 Other symptoms and signs involving the musculoskeletal system: Secondary | ICD-10-CM

## 2021-06-03 DIAGNOSIS — R278 Other lack of coordination: Secondary | ICD-10-CM

## 2021-06-03 DIAGNOSIS — R293 Abnormal posture: Secondary | ICD-10-CM

## 2021-06-03 NOTE — Therapy (Signed)
[facility] [address], [place], [place], [address] Phone: [phone]   Fax:  [phone]  Occupational Therapy Evaluation  Patient Details  Name: Matthew [name] MRN: [id number] Date of Birth: Oct 17, 1950 Referring Provider (OT): Tat, Eustace Quail, DO   Encounter Date: 06/03/2021   OT End of Session - 06/03/21 1223     Visit Number 1    Number of Visits 17    Date for OT Re-Evaluation 07/29/21    Authorization Type [employer]    OT Start Time 0936    OT Stop Time 1023    OT Time Calculation (min) 47 min    Activity Tolerance Patient tolerated treatment well    Behavior During Therapy [facility] for tasks assessed/performed;Flat affect             Past Medical History:  Diagnosis Date   Anxiety    Arthritis    GERD (gastroesophageal reflux disease)    occ remote history   Hypertension    Parkinson disease ([place])    Prostate cancer ([place])    Right knee pain    questionable meniscal tear   Seasonal allergies     Past Surgical History:  Procedure Laterality Date   COLONOSCOPY     Leg Laceration Repair  Left    Age 71   PELVIC LYMPH NODE DISSECTION Bilateral 03/22/2018   Procedure: PELVIC LYMPH NODE DISSECTION;  Surgeon: Ceasar Mons, MD;  Location: [facility];  Service: Urology;  Laterality: Bilateral;   PROSTATE BIOPSY     ROBOT ASSISTED LAPAROSCOPIC RADICAL PROSTATECTOMY N/A 03/22/2018   Procedure: XI ROBOTIC ASSISTED LAPAROSCOPIC PROSTATECTOMY;  Surgeon: Ceasar Mons, MD;  Location: [facility];  Service: Urology;  Laterality: N/A;   TOTAL KNEE ARTHROPLASTY Right 05/05/2019   Procedure: RIGHT TOTAL KNEE ARTHROPLASTY;  Surgeon: Frederik Pear, MD;  Location: [facility];  Service: Orthopedics;  Laterality: Right;    There were no vitals filed for this visit.   Subjective Assessment - 06/03/21 0943     Subjective  Pt reports seeing Dr. Carles Collet due to increased difficulty and stiffness impacting ability to  complete self-care tasks and even fastening seat belt.  Pt reports taking 30-60 mins to get dressed in the morning, wife occasionally helping with sock/shoes and jacket if time constraints.    Patient is accompanied by: Family member   wife, [name]   Pertinent History anxiety, arthritis, GERD, HTN, R TKR    Currently in Pain? No/denies    Pain Location Shoulder    Pain Orientation Right    Pain Descriptors / Indicators Aching;Tightness    Pain Type Chronic pain    Pain Onset More than a month ago    Pain Frequency Intermittent    Aggravating Factors  lifting arm overhead    Pain Relieving Factors advil               [facility] OT Assessment - 06/03/21 0923       Assessment   Medical Diagnosis Parkinson's disease    Referring Provider (OT) Tat, Eustace Quail, DO    Onset Date/Surgical Date 05/26/21   MD order   Hand Dominance Right    Prior Therapy yes - OT, PT, ST      Precautions   Precautions Fall      Balance Screen   Has the patient fallen in the past 6 months Yes    How many times? 2    Has the patient had a  decrease in activity level because of a fear of falling?  Yes    Is the patient reluctant to leave their home because of a fear of falling?  Yes   "a little bit"     Home  Environment   Family/patient expects to be discharged to: Private residence    Living Arrangements Spouse/significant other    Available Help at Discharge Available 24 hours/day    Type of Homestead   2 steps to enter in garage   Connell   grab bars, shower chair, hand held shower head   Bathroom Toilet Handicapped height    Home Equipment Toilet riser;Shower seat;Walker - 2 wheels;Walker - 4 wheels;Cane - single point;Cane -quad;Grab bars - tub/shower;Hand held shower head      Prior Function   Level of Independence Independent    Vocation Retired    Leisure Enjoys being with guy friends playing horsehoes and darts;  spending time with grandchildren, enjoys Proofreader, live music.  Sometimes walks in the neighborhood.  Dance Project Class online and drumming class.      ADL   Eating/Feeding Needs assist with cutting food   reports difficulty with spilling food, cutting food, scooping food   Grooming --   switched to electric toothbrush and water pick, difficulty with brushing hair   Upper Body Dressing Needs assist for fasteners   occasional assist from wife if needing to hurry or gets stuck   Lower Body Dressing Needs assist for fasteners   difficulty with ability to reach towards feet to don socks/shoes or LB clothing   Toilet Transfer Modified independent    Toileting -  Hygiene --   reports "struggle" with hygiene   Tub/Shower Transfer Modified independent      IADL   Community Mobility Relies on family or friends for transportation    Medication Management Is responsible for taking medication in correct dosages at correct time    Financial Management --   wife always has     Written Expression   Dominant Hand Right    Handwriting Mild micrographia;Increased time      Vision - History   Baseline Vision Wears glasses all the time      Cognition   Area of Impairment --   Slower overall processing     Observation/Other Assessments   Physical Performance Test   Yes    Simulated Eating Time (seconds) 20.81    Donning Doffing Jacket Time (seconds) >2 mins, required physical assist due to getting stuck    Donning Doffing Jacket Comments 3 button/unbutton:  4:18.10 (took 3:20 to button)      Posture/Postural Control   Posture/Postural Control Postural limitations    Postural Limitations Rounded Shoulders;Forward head;Posterior pelvic tilt;Flexed trunk      Coordination   Box and Blocks R: 42 and L: 39      Tone   Assessment Location Right Upper Extremity;Left Upper Extremity      AROM   Overall AROM  Deficits    Overall AROM Comments R and L shoulder 124*, R elbow -16* and L elbow -8*       Strength   Overall Strength Deficits    Strength Assessment Site Shoulder;Elbow    Right/Left Shoulder Right;Left    Right Shoulder Flexion 4/5    Left Shoulder Flexion 4/5    Right/Left Elbow Right;Left    Right Elbow Flexion 4/5  Right Elbow Extension 4/5    Left Elbow Flexion 4/5    Left Elbow Extension 4/5      RUE Tone   RUE Tone Mild   greater than left     LUE Tone   LUE Tone Mild                              OT Education - 06/03/21 1207     Education Details Educated on figure 4 positioning with LB dressing and use of stretching and functional activity to decrease stiffness    Person(s) Educated Patient;Spouse    Methods Explanation;Demonstration    Comprehension Verbalized understanding;Need further instruction              OT Short Term Goals - 06/03/21 1214       OT SHORT TERM GOAL #1   Title Pt will be Independent with PD specific HEP    Time 4    Period Weeks    Status New    Target Date 07/01/21      OT SHORT TERM GOAL #2   Title Pt will verbalize understanding of adapted strategies to maximize safety and I with ADLs/ IADLs .    Time 4    Period Weeks    Status New      OT SHORT TERM GOAL #3   Title Pt will retrieve a lightweight object from overhead shelf at 130 shoulder flexion and -5 elbow extension with left and right UE's    Baseline R: shoulder 124 and elbow -16  L: shoulder 124 and elbow - 8    Time 4    Period Weeks    Status New      OT SHORT TERM GOAL #4   Title Pt will demonstrate increased ease with dressing as evidenced by decreasing PPT#4(don/ doff jacket) by 30 seconds    Baseline > 2 mins.  Will reassess during next session as pt getting stuck in jacket    Time 4    Period Weeks    Status New               OT Long Term Goals - 06/03/21 1217       OT LONG TERM GOAL #1   Title Pt will verbalize understanding of ways to prevent future PD related complications and PD community resources  following review    Time 8    Period Weeks    Status New    Target Date 07/29/21      OT LONG TERM GOAL #2   Title Pt will demonstrate improved fine motor coordination for ADLs as evidenced by decreasing 9 hole peg test score for RUE by 3 secs    Baseline baseline score TBD    Time 8    Period Weeks    Status New      OT LONG TERM GOAL #3   Title Pt will demonstrate improved ease with dressing as eveidenced by decreasing 3 button/ unbutton time to 2 min less    Baseline 4:18.1    Time 8    Period Weeks    Status New      OT LONG TERM GOAL #4   Title Pt will report pain in right shoulder no greater than 2/10 for ADLs    Time 8    Period Weeks    Status New      OT LONG TERM GOAL #5  Title Pt will demonstrate improved ease with feeding as evidenced by decreasing PPT#2 (self feeding) by 3 secs    Baseline 20.81    Time 8    Period Weeks    Status New                   Plan - 06/03/21 1208     Clinical Impression Statement Pt is a 71 y/o male who presents to OP OT due to increased stiffness, difficulty raising arms overhead d/t pain, increased difficulty with getting dressed and managing clothing fasteners, and increased difficulty with leisure pursuits of drumming and playing guitar. Pt currently lives with wife in a 2 story home with 2 steps to enter and is retired prior to onset. PMHx includes anxiety, arthritis, GERD, HTN, R TKR. Pt will benefit from skilled occupational therapy services to address strength and coordination, ROM, pain management, balance, GM/FM control, cognition, safety awareness, introduction of compensatory strategies/AE prn, and implementation of an HEP to improve participation and safety during ADLs, IADLs, and leisure pursuits.    OT Occupational Profile and History Detailed Assessment- Review of Records and additional review of physical, cognitive, psychosocial history related to current functional performance    Occupational performance  deficits (Please refer to evaluation for details): ADL's;IADL's;Leisure    Body Structure / Function / Physical Skills ADL;Balance;Body mechanics;Coordination;Endurance;Flexibility;FMC;GMC;IADL;Mobility;Pain;ROM;Sensation;Strength;Tone;UE functional use    Rehab Potential Fair    Clinical Decision Making Limited treatment options, no task modification necessary    Comorbidities Affecting Occupational Performance: May have comorbidities impacting occupational performance    Modification or Assistance to Complete Evaluation  No modification of tasks or assist necessary to complete eval    OT Frequency 2x / week    OT Duration 8 weeks    OT Treatment/Interventions Self-care/ADL training;Cryotherapy;Electrical Stimulation;Moist Heat;Ultrasound;Iontophoresis;Therapeutic exercise;Neuromuscular education;Energy conservation;DME and/or AE instruction;Functional Mobility Training;Manual Therapy;Passive range of motion;Therapeutic activities;Cognitive remediation/compensation;Patient/family education;Balance training;Psychosocial skills training    Plan jacket, edu/practice figure 4 position for LB dsg, complete 9 hole peg test for time and set goal    Consulted and Agree with Plan of Care Patient;Family member/caregiver    Family Member Consulted wife, [name]             Patient will benefit from skilled therapeutic intervention in order to improve the following deficits and impairments:   Body Structure / Function / Physical Skills: ADL, Balance, Body mechanics, Coordination, Endurance, Flexibility, FMC, GMC, IADL, Mobility, Pain, ROM, Sensation, Strength, Tone, UE functional use       Visit Diagnosis: Other lack of coordination  Other symptoms and signs involving the musculoskeletal system  Other symptoms and signs involving the nervous system  Muscle weakness (generalized)  Abnormal posture  Unsteadiness on feet  Other abnormalities of gait and mobility    Problem List Patient  Active Problem List   Diagnosis Date Noted   S/P TKR (total knee replacement), right 05/05/2019   Osteoarthritis of right knee 05/02/2019   Prostate cancer ([place]) 03/22/2018   Malignant neoplasm of prostate ([place]) 02/27/2018    Simonne Come, OT 06/03/2021, 12:23 PM  [facility] [address] [place], [place], [address] Phone: [phone]   Fax:  [phone]  Name: Garnet Overfield MRN: [id number] Date of Birth: 04/10/1951

## 2021-06-09 ENCOUNTER — Telehealth: Payer: Self-pay | Admitting: Neurology

## 2021-06-09 MED ORDER — ENTACAPONE 200 MG PO TABS: 200.0000 mg | ORAL_TABLET | Freq: Three times a day (TID) | ORAL | 3 refills | Status: DC

## 2021-06-09 NOTE — Telephone Encounter (Signed)
Pt's wife called in stating the pt is having some reactions to the pramipexole. He started it on 06/02/21. He is supposed to increase the dosage today, but they want to check with [place] first. He is having swelling in his feet and ankles. His feet are also a different color than usual. The swelling goes down at night. He also has a headache and is sleepy.

## 2021-06-09 NOTE — Telephone Encounter (Signed)
Pt stated he was doing the titration that was on the AVS. Pt advised to stop medication and start entacapone, 200 mg, 1 tab with first 3 dosages of levodopa.  May turn urine orange pt verbalized understanding, [facility]

## 2021-06-14 ENCOUNTER — Ambulatory Visit: Payer: PPO

## 2021-06-14 ENCOUNTER — Ambulatory Visit: Payer: PPO | Admitting: Occupational Therapy

## 2021-06-14 ENCOUNTER — Ambulatory Visit: Payer: PPO | Admitting: Physical Therapy

## 2021-06-15 ENCOUNTER — Encounter: Payer: Self-pay | Admitting: Occupational Therapy

## 2021-06-15 ENCOUNTER — Encounter: Payer: Self-pay | Admitting: Physical Therapy

## 2021-06-15 ENCOUNTER — Ambulatory Visit: Payer: PPO | Admitting: Occupational Therapy

## 2021-06-15 ENCOUNTER — Ambulatory Visit: Payer: PPO

## 2021-06-15 ENCOUNTER — Other Ambulatory Visit: Payer: Self-pay

## 2021-06-15 ENCOUNTER — Ambulatory Visit: Payer: PPO | Admitting: Physical Therapy

## 2021-06-15 DIAGNOSIS — R278 Other lack of coordination: Secondary | ICD-10-CM

## 2021-06-15 DIAGNOSIS — R29898 Other symptoms and signs involving the musculoskeletal system: Secondary | ICD-10-CM

## 2021-06-15 DIAGNOSIS — R29818 Other symptoms and signs involving the nervous system: Secondary | ICD-10-CM

## 2021-06-15 DIAGNOSIS — R293 Abnormal posture: Secondary | ICD-10-CM

## 2021-06-15 DIAGNOSIS — M6281 Muscle weakness (generalized): Secondary | ICD-10-CM

## 2021-06-15 DIAGNOSIS — R471 Dysarthria and anarthria: Secondary | ICD-10-CM

## 2021-06-15 DIAGNOSIS — R2689 Other abnormalities of gait and mobility: Secondary | ICD-10-CM | POA: Diagnosis not present

## 2021-06-15 DIAGNOSIS — R2681 Unsteadiness on feet: Secondary | ICD-10-CM

## 2021-06-15 DIAGNOSIS — R4701 Aphasia: Secondary | ICD-10-CM

## 2021-06-15 DIAGNOSIS — R1310 Dysphagia, unspecified: Secondary | ICD-10-CM

## 2021-06-15 DIAGNOSIS — R41841 Cognitive communication deficit: Secondary | ICD-10-CM

## 2021-06-15 NOTE — Patient Instructions (Signed)
° °  Twice, everyday: 5 loud "ah"  Read the everyday sentences ("...hello grils", etc) Read liner notes for 60 seconds, 3 times

## 2021-06-15 NOTE — Therapy (Signed)
[facility] [address], [place], [place], [address] Phone: [phone]   Fax:  [phone]  Speech Language Pathology Treatment  Patient Details  Name: Matthew [name] MRN: [id number] Date of Birth: 05-07-1951 Referring Provider (SLP): Alonza Bogus, DO   Encounter Date: 06/15/2021   End of Session - 06/15/21 1330     Visit Number 2    Number of Visits 17    Date for SLP Re-Evaluation 08/05/21    SLP Start Time 1233    SLP Stop Time  2706    SLP Time Calculation (min) 42 min    Activity Tolerance Patient tolerated treatment well             Past Medical History:  Diagnosis Date   Anxiety    Arthritis    GERD (gastroesophageal reflux disease)    occ remote history   Hypertension    Parkinson disease ([place])    Prostate cancer ([place])    Right knee pain    questionable meniscal tear   Seasonal allergies     Past Surgical History:  Procedure Laterality Date   COLONOSCOPY     Leg Laceration Repair  Left    Age 71   PELVIC LYMPH NODE DISSECTION Bilateral 03/22/2018   Procedure: PELVIC LYMPH NODE DISSECTION;  Surgeon: Ceasar Mons, MD;  [facility];  Service: Urology;  Laterality: Bilateral;   PROSTATE BIOPSY     ROBOT ASSISTED LAPAROSCOPIC RADICAL PROSTATECTOMY N/A 03/22/2018   Procedure: XI ROBOTIC ASSISTED LAPAROSCOPIC PROSTATECTOMY;  Surgeon: Ceasar Mons, MD;  [facility];  Service: Urology;  Laterality: N/A;   TOTAL KNEE ARTHROPLASTY Right 05/05/2019   Procedure: RIGHT TOTAL KNEE ARTHROPLASTY;  Surgeon: Frederik Pear, MD;  [facility];  Service: Orthopedics;  Laterality: Right;    There were no vitals filed for this visit.   Subjective Assessment - 06/15/21 1308     Subjective PTA "That was the loudest he was with me (when you came out of ST room)."    Currently in Pain? No/denies                   ADULT SLP TREATMENT - 06/15/21 1309       General  Information   Behavior/Cognition Alert;Pleasant mood;Cooperative      Treatment Provided   Treatment provided Cognitive-Linquistic      Cognitive-Linquistic Treatment   Treatment focused on Dysarthria    Skilled Treatment In 12 minutes conversation prior to loud /a/ pt averaged mid-upper 60s dB loudness. SLP used loud /a/ to begin to recalibrate speech loudness in conversation - pt averaged upper 80s -low 90s dB with intial cues for full breath. With everyday sentences he averaged low-mid 80s dB. Matthew [name] completed the Communication Effectibeness Survey and he scored 22/36. SLP noted pt with some pausing in his verbal expression due to what pt described as "slow" thinking - "I can't put it into words as easy as I could before." Homework for ahs, sentences, and 60 seconds reading liner notes x3.      Assessment / Recommendations / Plan   Plan Continue with current plan of care      Progression Toward Goals   Progression toward goals Progressing toward goals                SLP Short Term Goals - 06/15/21 1332       SLP SHORT TERM GOAL #1   Title pt will produce /a/  with average upper 80s dB at 30 cm over 3 sessions    Baseline 06-15-21    Time 4    Period Weeks    Status On-going    Target Date 07/01/21      SLP SHORT TERM GOAL #2   Title pt will demo abdominal breathing with sentence responses 85% accuracy x 3 sessions    Time 4    Period Weeks    Status On-going    Target Date 07/01/21      SLP SHORT TERM GOAL #3   Title pt will generate 5 minutes simple conversation with low 70s dB average in 3 sessions    Time 4    Period Weeks    Status On-going    Target Date 07/01/21      SLP SHORT TERM GOAL #4   Title pt will undergo insturmental swallow assessment (MBSS or FEES) if clinically indicated    Time 4    Period Weeks    Status On-going    Target Date 07/01/21              SLP Long Term Goals - 06/15/21 1332       SLP LONG TERM GOAL #1   Title pt will use  abdominal breathing >80% of the time during 10 minute simple conversation in 3 sessions    Time 8    Period Weeks    Status On-going    Target Date 08/05/21      SLP LONG TERM GOAL #2   Title pt will maintain upper 60s dB average in 10 minute simple conversation x 3 sessions    Time 8    Period Weeks    Status On-going    Target Date 08/05/21      SLP LONG TERM GOAL #3   Title pt will report fewer requests to repeat himself than prior to ST in 3 sessions    Time 8    Period Weeks    Status On-going    Target Date 08/05/21      SLP LONG TERM GOAL #4   Title pt will produce average upper 60s dB in 10 minutes simple to mod complex conversation in 3 sessions    Time 8    Period Weeks    Status On-going    Target Date 08/05/21      SLP LONG TERM GOAL #5   Title pt will score better on a speech QOL questionairre than in the first 1-2 ST sessions    Time 8    Period Weeks    Status On-going    Target Date 08/05/21              Plan - 06/15/21 1331     Clinical Impression Statement [place]") presents today with 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 SLP believes pt will benefit from skilled ST targeting speech loudness and clarity. Additionally, pt noted to clear throat consistently with thin liquids. Pt may require an instrumental swallow evaluation during this therapy course and DO signature on this plan of care will be understood to mean that referral for MBSS or FEES is provided.    Speech Therapy Frequency 2x / week    Duration 8 weeks    Treatment/Interventions Aspiration precaution training;Language facilitation;Environmental controls;Cueing  hierarchy;SLP instruction and feedback;Compensatory techniques;Functional tasks;Compensatory  strategies;Patient/family education;Diet toleration management by SLP;Other (comment)    Potential to Achieve Goals Good    SLP Home Exercise Plan provided    Consulted and Agree with Plan of Care Patient             Patient will benefit from skilled therapeutic intervention in order to improve the following deficits and impairments:   Dysarthria and anarthria  Dysphagia, unspecified type  Aphasia  Cognitive communication deficit    Problem List Patient Active Problem List   Diagnosis Date Noted   S/P TKR (total knee replacement), right 05/05/2019   Osteoarthritis of right knee 05/02/2019   Prostate cancer ([place]) 03/22/2018   Malignant neoplasm of prostate ([place]) 02/27/2018    [place], [place] 06/15/2021, 1:33 PM  [facility] [address], [place], [place], [address] Phone: [phone]   Fax:  [phone]   Name: Matthew [name] MRN: [id number] Date of Birth: 1951-05-20

## 2021-06-15 NOTE — Therapy (Signed)
[facility] [address] [place], [place], [address] Phone: [phone]   Fax:  [phone]  Physical Therapy Treatment  Patient Details  Name: Matthew [name] MRN: [id number] Date of Birth: 16-Apr-1951 Referring Provider (PT): Tat, Wells Guiles   Encounter Date: 06/15/2021   PT End of Session - 06/15/21 1251     Visit Number 2    Number of Visits 17    Date for PT Re-Evaluation 07/29/21    Authorization Type [employer]    Progress Note Due on Visit 10    PT Start Time 1145    PT Stop Time 1230    PT Time Calculation (min) 45 min    Activity Tolerance Patient tolerated treatment well    Behavior During Therapy [facility] for tasks assessed/performed;Flat affect             Past Medical History:  Diagnosis Date   Anxiety    Arthritis    GERD (gastroesophageal reflux disease)    occ remote history   Hypertension    Parkinson disease ([place])    Prostate cancer ([place])    Right knee pain    questionable meniscal tear   Seasonal allergies     Past Surgical History:  Procedure Laterality Date   COLONOSCOPY     Leg Laceration Repair  Left    Age 17   PELVIC LYMPH NODE DISSECTION Bilateral 03/22/2018   Procedure: PELVIC LYMPH NODE DISSECTION;  Surgeon: Ceasar Mons, MD;  Location: [facility];  Service: Urology;  Laterality: Bilateral;   PROSTATE BIOPSY     ROBOT ASSISTED LAPAROSCOPIC RADICAL PROSTATECTOMY N/A 03/22/2018   Procedure: XI ROBOTIC ASSISTED LAPAROSCOPIC PROSTATECTOMY;  Surgeon: Ceasar Mons, MD;  Location: [facility];  Service: Urology;  Laterality: N/A;   TOTAL KNEE ARTHROPLASTY Right 05/05/2019   Procedure: RIGHT TOTAL KNEE ARTHROPLASTY;  Surgeon: Frederik Pear, MD;  Location: [facility];  Service: Orthopedics;  Laterality: Right;    There were no vitals filed for this visit.   Subjective Assessment - 06/15/21 1057     Patient is accompained by: Family member   wife   Pertinent History Shoulder  pain and back pain (?arthritis); saw orthopedist to f/u wtih R knee (pain from TKR) and mild tendonitis    Patient Stated Goals Pt's goals for therapy are to move normally and move better.  Wife-not fall.    Pain Onset More than a month ago                 [facility] Adult PT Treatment/Exercise - 06/15/21 0001       Transfers   Transfers Sit to Stand;Stand to Sit    Sit to Stand 5: Supervision    Stand to Sit 5: Supervision    Number of Reps 10 reps;2 sets    Comments From mat then from chair      Ambulation/Gait   Ambulation/Gait Yes    Ambulation/Gait Assistance 5: Supervision    Ambulation Distance (Feet) 400 Feet    Assistive device Other (Comment)   bil walking poles   Gait Pattern Decreased step length - right;Decreased step length - left;Decreased arm swing - right;Decreased arm swing - left;Step-through pattern;Right flexed knee in stance;Left flexed knee in stance;Shuffle;Narrow base of support    Ambulation Surface Level;Indoor    Gait Comments worked on sequencing walking poles with bil arm swing.  cues for upright posture and knee extension.  Tends to keep knee bent  Posture/Postural Control   Posture/Postural Control Postural limitations    Postural Limitations Rounded Shoulders;Forward head;Posterior pelvic tilt;Flexed trunk      Neuro Re-ed    Neuro Re-ed Details  PWR! moves in standing near counter.  Pt performed 10 reps of each of the 4 moves.  Cues for intensity and technique.  Tends to have significant flexion in knees and hips.  Forward, backward and forward<>backward step weight shifting at counter.  Pt initially performed without UE assist but this increased flexed posture so had pt perform with 1UE on counter to encourage upright posture.  Cues to focus on weight shift component as well.  Had pt perform PWR! twist in corner using wall for target.      Exercises   Exercises Knee/Hip;Ankle      Knee/Hip Exercises: Stretches   Active Hamstring Stretch  Both;2 reps;20 seconds    Active Hamstring Stretch Limitations decreased ROM    Passive Hamstring Stretch Both;5 reps;10 seconds    Passive Hamstring Stretch Limitations contract/relax for increased ROM      Knee/Hip Exercises: Supine   Writer;Both      Ankle Exercises: Seated   Toe Raise 10 reps;2 seconds                     PT Education - 06/15/21 1250     Education Details Using corner for PWR! twist in standing at home, gait with walking poles    Person(s) Educated Patient    Methods Explanation;Demonstration;Verbal cues    Comprehension Verbalized understanding;Need further instruction              PT Short Term Goals - 06/01/21 1703       PT SHORT TERM GOAL #1   Title Pt will be indepedent with Parkinson's specific HEP to address balance, transfers, gait for improved overall mobility.  TARGET 07/01/2021    Time 4    Period Weeks    Status New      PT SHORT TERM GOAL #2   Title Pt will improve 5x sit<>stand to less than or equal to 10 sec to demonstrate improved functional strength and transfer efficiency.    Baseline 11.66 sec    Time 4    Period Weeks    Status New      PT SHORT TERM GOAL #3   Title Pt will improve TUG score to less than or equal to 13.5 sec for decreased fall risk.    Baseline 16.19 sec    Time 4    Period Weeks    Status New      PT SHORT TERM GOAL #4   Title Pt will perform push and release test in posterior direction in 2 steps or less for improved balance recovery.    Baseline 4 steps, would fall if unaided    Time 4    Period Weeks    Status New               PT Long Term Goals - 06/01/21 1705       PT LONG TERM GOAL #1   Title Pt will be independent with progression of HEP for Parkinson's related deficits.  TARGET 07/29/2021    Time 8    Period Weeks    Status New      PT LONG TERM GOAL #2   Title Pt will perform at least 8 of 10 reps of sit<>stand from <18" surfaces, for improved transfer  efficiency and safety.  Time 8    Period Weeks    Status New      PT LONG TERM GOAL #3   Title Pt will improve MiniBESTest score to at least 21/28 for decreased fall risk.    Baseline 14/28    Time 8    Period Weeks    Status New      PT LONG TERM GOAL #4   Title Pt will improve TUG cognitive score to less than or equal to 15 sec for improved dual tasking with gait.    Baseline 18.44 sec    Time 8    Period Weeks    Status New      PT LONG TERM GOAL #5   Title Pt will verbalize plans for continued community fitness upon d/c from PT.    Time 8    Period Weeks    Status New                   Plan - 06/15/21 1252     Clinical Impression Statement Skilled session focused on flexibility, gait, functional mobility.  Pt tends to perform all mobility with increased knee and hip flexion.  Tight in bil hamstrings.  Pt needs cues through out session for intensity and full ROM.  Cont per poc.    Comorbidities anxiety, arthritis, GERD, HTN, R TKR    Examination-Activity Limitations Locomotion Level;Transfers;Bend;Squat;Stairs;Stand    Examination-Participation Restrictions Community Activity;Other    Stability/Clinical Decision Making Evolving/Moderate complexity    Rehab Potential Good    PT Frequency 2x / week    PT Duration 8 weeks    PT Treatment/Interventions ADLs/Self Care Home Management;Gait training;Functional mobility training;Therapeutic activities;Therapeutic exercise;Balance training;Neuromuscular re-education;DME Instruction;Manual techniques;Passive range of motion;Patient/family education    PT Next Visit Plan Pt tends to bring his PD notebook with him of all his exercises so update HEP as needed, work on weight shifting in standing encouraging postuer, sit<>stand transfers, balance; gait training with appropriate assistive device    Consulted and Agree with Plan of Care Patient             Patient will benefit from skilled therapeutic intervention in  order to improve the following deficits and impairments:  Abnormal gait, Difficulty walking, Decreased balance, Impaired flexibility, Postural dysfunction, Decreased strength, Decreased mobility  Visit Diagnosis: Other abnormalities of gait and mobility  Unsteadiness on feet  Abnormal posture  Muscle weakness (generalized)  Other symptoms and signs involving the nervous system     Problem List Patient Active Problem List   Diagnosis Date Noted   S/P TKR (total knee replacement), right 05/05/2019   Osteoarthritis of right knee 05/02/2019   Prostate cancer ([place]) 03/22/2018   Malignant neoplasm of prostate ([place]) 02/27/2018   Narda Bonds, PTA [place] 06/15/21 1:00 PM Phone: [phone] Fax: [facility] [address], [place], [place], [address] Phone: [phone]   Fax:  [phone]  Name: Matthew [name] MRN: [id number] Date of Birth: 10/18/1950

## 2021-06-15 NOTE — Therapy (Signed)
[facility] [address] [place], [place], [address] Phone: [phone]   Fax:  [phone]  Occupational Therapy Treatment  Patient Details  Name: Matthew [name] MRN: [id number] Date of Birth: [birth year] Referring Provider (OT): Tat, Eustace Quail, DO   Encounter Date: 06/15/2021   OT End of Session - 06/15/21 1055     Visit Number 2    Number of Visits 17    Date for OT Re-Evaluation 07/29/21    Authorization Type [employer]    OT Start Time 1021    OT Stop Time 1104    OT Time Calculation (min) 43 min    Activity Tolerance Patient tolerated treatment well    Behavior During Therapy [facility] for tasks assessed/performed;Flat affect             Past Medical History:  Diagnosis Date   Anxiety    Arthritis    GERD (gastroesophageal reflux disease)    occ remote history   Hypertension    Parkinson disease ([place])    Prostate cancer ([place])    Right knee pain    questionable meniscal tear   Seasonal allergies     Past Surgical History:  Procedure Laterality Date   COLONOSCOPY     Leg Laceration Repair  Left    Age 89   PELVIC LYMPH NODE DISSECTION Bilateral 03/22/2018   Procedure: PELVIC LYMPH NODE DISSECTION;  Surgeon: Ceasar Mons, MD;  [facility];  Service: Urology;  Laterality: Bilateral;   PROSTATE BIOPSY     ROBOT ASSISTED LAPAROSCOPIC RADICAL PROSTATECTOMY N/A 03/22/2018   Procedure: XI ROBOTIC ASSISTED LAPAROSCOPIC PROSTATECTOMY;  Surgeon: Ceasar Mons, MD;  [facility];  Service: Urology;  Laterality: N/A;   TOTAL KNEE ARTHROPLASTY Right 05/05/2019   Procedure: RIGHT TOTAL KNEE ARTHROPLASTY;  Surgeon: Frederik Pear, MD;  [facility];  Service: Orthopedics;  Laterality: Right;    There were no vitals filed for this visit.   Subjective Assessment - 06/15/21 1026     Subjective  Pt reports he is moving slowly today, due to not being a morning person.    Patient is  accompanied by: Family member   wife, [name]   Pertinent History anxiety, arthritis, GERD, HTN, R TKR    Patient Stated Goals to be able to function better    Currently in Pain? No/denies    Pain Onset More than a month ago                [facility] OT Assessment - 06/15/21 1107       Observation/Other Assessments   Donning Doffing Jacket Time (seconds) 1:36.37      Coordination   9 Hole Peg Test Right;Left    Right 9 Hole Peg Test 46.28    Left 9 Hole Peg Test L 46.75             Pt brought in exercise book.  Reiterated PWR! Hands, [name], Twist, and Step as provided from previous OT.  Pt completed each x20 with min cues for large amplitude movements.  Educated on use of PWR! Hand moves during functional fine motor tasks as needed to increase success with tasks  Donning/doffing jacket.  Educated on big movements when attempting to doff jacket as pt with difficulty getting it over shoulders. Pt required 1:36.37 to don jacket, requiring cues to adjust along neckline.  Educated on using "cape" technique with large UE swooping movement.  During 2nd attempt, pt  able to doff jacket with improved success.   Pt then required 46.44 sec to don, with pt getting stuck at R elbow and then L wrist, but overall improvements with use of "cape" technique.  Pt completed 3rd time, untimed, with improved success with "cape" technique and improved positioning of LUE at higher range to not get arm stuck in sleeve hole.                  OT Short Term Goals - 06/15/21 1056       OT SHORT TERM GOAL #1   Title Pt will be Independent with PD specific HEP    Time 4    Period Weeks    Status On-going    Target Date 07/01/21      OT SHORT TERM GOAL #2   Title Pt will verbalize understanding of adapted strategies to maximize safety and I with ADLs/ IADLs .    Time 4    Period Weeks    Status On-going      OT SHORT TERM GOAL #3   Title Pt will retrieve a lightweight object from overhead  shelf at 130 shoulder flexion and -5 elbow extension with left and right UE's    Baseline R: shoulder 124 and elbow -16  L: shoulder 124 and elbow - 8    Time 4    Period Weeks    Status On-going      OT SHORT TERM GOAL #4   Title Pt will demonstrate increased ease with dressing as evidenced by decreasing PPT#4(don/ doff jacket) by 30 seconds    Baseline > 2 mins.  Will reassess during next session as pt getting stuck in jacket    Time 4    Period Weeks    Status On-going               OT Long Term Goals - 06/15/21 1056       OT LONG TERM GOAL #1   Title Pt will verbalize understanding of ways to prevent future PD related complications and PD community resources following review    Time 8    Period Weeks    Status On-going    Target Date 07/29/21      OT LONG TERM GOAL #2   Title Pt will demonstrate improved fine motor coordination for ADLs as evidenced by decreasing 9 hole peg test score for RUE by 3 secs    Baseline R: 46.28 and L: 46.75    Time 8    Period Weeks    Status On-going      OT LONG TERM GOAL #3   Title Pt will demonstrate improved ease with dressing as eveidenced by decreasing 3 button/ unbutton time to 2 min or less    Baseline 4:18.1    Time 8    Period Weeks    Status On-going      OT LONG TERM GOAL #4   Title Pt will report pain in right shoulder no greater than 2/10 for ADLs    Time 8    Period Weeks    Status On-going      OT LONG TERM GOAL #5   Title Pt will demonstrate improved ease with feeding as evidenced by decreasing PPT#2 (self feeding) by 3 secs    Baseline 20.81    Time 8    Period Weeks    Status On-going  Plan - 06/15/21 1057     Clinical Impression Statement Pt brought in notebook with exercises from previous OT from prior bout of therapy services.  Pt demonstrating good recall of PWR! hands exercises and reports completing ~5x/week.  Pt receptive to education to incorporate PWR! hands during  completion of [facility] tasks when having increased difficulty.  Pt demonstrating carryover of education with simple PWR! flicks during completion of 9 hole peg test.  Pt reports mild pain and decreased ROM in R shoulder impacting ease of donning jacket.  Pt receptive to "cape" method when donning jacket, however will benefit from continued practice for increased carryover.  Pt reports enjoying his PD dance classes on Zoom.    OT Occupational Profile and History Detailed Assessment- Review of Records and additional review of physical, cognitive, psychosocial history related to current functional performance    Occupational performance deficits (Please refer to evaluation for details): ADL's;IADL's;Leisure    Body Structure / Function / Physical Skills ADL;Balance;Body mechanics;Coordination;Endurance;Flexibility;FMC;GMC;IADL;Mobility;Pain;ROM;Sensation;Strength;Tone;UE functional use    Rehab Potential Fair    Clinical Decision Making Limited treatment options, no task modification necessary    Comorbidities Affecting Occupational Performance: May have comorbidities impacting occupational performance    Modification or Assistance to Complete Evaluation  No modification of tasks or assist necessary to complete eval    OT Frequency 2x / week    OT Duration 8 weeks    OT Treatment/Interventions Self-care/ADL training;Cryotherapy;Electrical Stimulation;Moist Heat;Ultrasound;Iontophoresis;Therapeutic exercise;Neuromuscular education;Energy conservation;DME and/or AE instruction;Functional Mobility Training;Manual Therapy;Passive range of motion;Therapeutic activities;Cognitive remediation/compensation;Patient/family education;Balance training;Psychosocial skills training    Plan jacket, edu/practice figure 4 position for LB dsg, shoulder pain/stretches and HEP    Consulted and Agree with Plan of Care Patient;Family member/caregiver    Family Member Consulted wife, [name]             Patient will benefit  from skilled therapeutic intervention in order to improve the following deficits and impairments:   Body Structure / Function / Physical Skills: ADL, Balance, Body mechanics, Coordination, Endurance, Flexibility, FMC, GMC, IADL, Mobility, Pain, ROM, Sensation, Strength, Tone, UE functional use       Visit Diagnosis: Other lack of coordination  Other symptoms and signs involving the musculoskeletal system  Other symptoms and signs involving the nervous system  Muscle weakness (generalized)  Abnormal posture  Unsteadiness on feet    Problem List Patient Active Problem List   Diagnosis Date Noted   S/P TKR (total knee replacement), right 05/05/2019   Osteoarthritis of right knee 05/02/2019   Prostate cancer ([place]) 03/22/2018   Malignant neoplasm of prostate ([place]) 02/27/2018    Simonne Come, OT 06/15/2021, 11:15 AM  [facility] [address], [place], [place], [address] Phone: [phone]   Fax:  [phone]  Name: Matthew [name] MRN: [id number] Date of Birth: November 23, 1950

## 2021-06-20 ENCOUNTER — Encounter: Payer: Self-pay | Admitting: Occupational Therapy

## 2021-06-20 ENCOUNTER — Ambulatory Visit: Payer: PPO | Admitting: Physical Therapy

## 2021-06-20 ENCOUNTER — Encounter: Payer: Self-pay | Admitting: Physical Therapy

## 2021-06-20 ENCOUNTER — Ambulatory Visit: Payer: PPO | Admitting: Occupational Therapy

## 2021-06-20 ENCOUNTER — Other Ambulatory Visit: Payer: Self-pay

## 2021-06-20 DIAGNOSIS — R2689 Other abnormalities of gait and mobility: Secondary | ICD-10-CM

## 2021-06-20 DIAGNOSIS — R29818 Other symptoms and signs involving the nervous system: Secondary | ICD-10-CM

## 2021-06-20 DIAGNOSIS — R293 Abnormal posture: Secondary | ICD-10-CM

## 2021-06-20 DIAGNOSIS — R29898 Other symptoms and signs involving the musculoskeletal system: Secondary | ICD-10-CM

## 2021-06-20 DIAGNOSIS — R278 Other lack of coordination: Secondary | ICD-10-CM

## 2021-06-20 DIAGNOSIS — R2681 Unsteadiness on feet: Secondary | ICD-10-CM

## 2021-06-20 DIAGNOSIS — M6281 Muscle weakness (generalized): Secondary | ICD-10-CM

## 2021-06-20 NOTE — Therapy (Signed)
[facility] [address], [place], [place], [address] Phone: [phone]   Fax:  [phone]  Occupational Therapy Treatment  Patient Details  Name: Matthew [name] MRN: [id number] Date of Birth: Sep 70, 1952 Referring Provider (OT): Tat, Eustace Quail, DO   Encounter Date: 06/20/2021   OT End of Session - 06/20/21 1500     Visit Number 3    Number of Visits 17    Date for OT Re-Evaluation 07/29/21    Authorization Type [employer]    OT Start Time 1455    OT Stop Time 1535    OT Time Calculation (min) 40 min    Activity Tolerance Patient tolerated treatment well    Behavior During Therapy [facility] for tasks assessed/performed;Flat affect             Past Medical History:  Diagnosis Date   Anxiety    Arthritis    GERD (gastroesophageal reflux disease)    occ remote history   Hypertension    Parkinson disease ([place])    Prostate cancer ([place])    Right knee pain    questionable meniscal tear   Seasonal allergies     Past Surgical History:  Procedure Laterality Date   COLONOSCOPY     Leg Laceration Repair  Left    Age 71   PELVIC LYMPH NODE DISSECTION Bilateral 03/22/2018   Procedure: PELVIC LYMPH NODE DISSECTION;  Surgeon: Ceasar Mons, MD;  Location: [facility];  Service: Urology;  Laterality: Bilateral;   PROSTATE BIOPSY     ROBOT ASSISTED LAPAROSCOPIC RADICAL PROSTATECTOMY N/A 03/22/2018   Procedure: XI ROBOTIC ASSISTED LAPAROSCOPIC PROSTATECTOMY;  Surgeon: Ceasar Mons, MD;  Location: [facility];  Service: Urology;  Laterality: N/A;   TOTAL KNEE ARTHROPLASTY Right 05/05/2019   Procedure: RIGHT TOTAL KNEE ARTHROPLASTY;  Surgeon: Frederik Pear, MD;  Location: [facility];  Service: Orthopedics;  Laterality: Right;    There were no vitals filed for this visit.   Subjective Assessment - 06/20/21 1458     Subjective  Pt reports "slow and mostly steady", reports some days he feels to be moving slower and  having more difficulty.    Patient is accompanied by: Family member    Pertinent History anxiety, arthritis, GERD, HTN, R TKR    Patient Stated Goals to be able to function better    Currently in Pain? No/denies    Pain Onset More than a month ago               ADL: Pt reports difficulty with drying back after showers.  Therapist educated on large amplitude movements, and "cape" method of swinging towel around back.  Discussed alternative nail clippers with built up handles, soaking nails before clipping, as pt reports increasing difficulty with clipping nails especially toenails.  Completed donning/doffing socks and shoes.  Educated on figure 4 positioning to increase reach and ease.  Educated on gathering up sock to toe portion and then placing on foot in more smooth movement instead of small repetitive movements.  Completed x2. Pt able to complete with improved success.  Discussed AE for increased ease with donning socks and shoes, pt reports having shoe funnel but not typically using it.  Encouraged pt to continue tasks as able with focus on large amplitude movements, use of AE as needed.                  OT Education - 06/21/21 0932     Education  Details educated on large amplitude movements with drying, donning socks/shoes    Person(s) Educated Patient    Methods Explanation;Demonstration    Comprehension Verbalized understanding;Need further instruction              OT Short Term Goals - 06/15/21 1056       OT SHORT TERM GOAL #1   Title Pt will be Independent with PD specific HEP    Time 4    Period Weeks    Status On-going    Target Date 07/01/21      OT SHORT TERM GOAL #2   Title Pt will verbalize understanding of adapted strategies to maximize safety and I with ADLs/ IADLs .    Time 4    Period Weeks    Status On-going      OT SHORT TERM GOAL #3   Title Pt will retrieve a lightweight object from overhead shelf at 130 shoulder flexion and -5  elbow extension with left and right UE's    Baseline R: shoulder 124 and elbow -16  L: shoulder 124 and elbow - 8    Time 4    Period Weeks    Status On-going      OT SHORT TERM GOAL #4   Title Pt will demonstrate increased ease with dressing as evidenced by decreasing PPT#4(don/ doff jacket) by 30 seconds    Baseline > 2 mins.  Will reassess during next session as pt getting stuck in jacket    Time 4    Period Weeks    Status On-going               OT Long Term Goals - 06/15/21 1056       OT LONG TERM GOAL #1   Title Pt will verbalize understanding of ways to prevent future PD related complications and PD community resources following review    Time 8    Period Weeks    Status On-going    Target Date 07/29/21      OT LONG TERM GOAL #2   Title Pt will demonstrate improved fine motor coordination for ADLs as evidenced by decreasing 9 hole peg test score for RUE by 3 secs    Baseline R: 46.28 and L: 46.75    Time 8    Period Weeks    Status On-going      OT LONG TERM GOAL #3   Title Pt will demonstrate improved ease with dressing as eveidenced by decreasing 3 button/ unbutton time to 2 min or less    Baseline 4:18.1    Time 8    Period Weeks    Status On-going      OT LONG TERM GOAL #4   Title Pt will report pain in right shoulder no greater than 2/10 for ADLs    Time 8    Period Weeks    Status On-going      OT LONG TERM GOAL #5   Title Pt will demonstrate improved ease with feeding as evidenced by decreasing PPT#2 (self feeding) by 3 secs    Baseline 20.81    Time 8    Period Weeks    Status On-going                   Plan - 06/20/21 1500     Clinical Impression Statement Pt engaged in education on variety of large amplitude movements with LB dressing to increase success with donning/doffing socks.  Pt demonstrating improved success  with change in technique.  Pt continues to report difficulty in variety of bathing and dressing tasks.  Pt receptive  to education on each task with focus on large amplitude movements and intentional movements as movements so frequently become smaller in nature with PD and therefore increase challenge.  Pt will benefit from continued practice with self-care tasks.    OT Occupational Profile and History Detailed Assessment- Review of Records and additional review of physical, cognitive, psychosocial history related to current functional performance    Occupational performance deficits (Please refer to evaluation for details): ADL's;IADL's;Leisure    Body Structure / Function / Physical Skills ADL;Balance;Body mechanics;Coordination;Endurance;Flexibility;FMC;GMC;IADL;Mobility;Pain;ROM;Sensation;Strength;Tone;UE functional use    Rehab Potential Fair    Clinical Decision Making Limited treatment options, no task modification necessary    Comorbidities Affecting Occupational Performance: May have comorbidities impacting occupational performance    Modification or Assistance to Complete Evaluation  No modification of tasks or assist necessary to complete eval    OT Frequency 2x / week    OT Duration 8 weeks    OT Treatment/Interventions Self-care/ADL training;Cryotherapy;Electrical Stimulation;Moist Heat;Ultrasound;Iontophoresis;Therapeutic exercise;Neuromuscular education;Energy conservation;DME and/or AE instruction;Functional Mobility Training;Manual Therapy;Passive range of motion;Therapeutic activities;Cognitive remediation/compensation;Patient/family education;Balance training;Psychosocial skills training    Plan jacket, edu/practice figure 4 position for LB dsg, shoulder pain/stretches and HEP    Consulted and Agree with Plan of Care Patient    Family Member Consulted --             Patient will benefit from skilled therapeutic intervention in order to improve the following deficits and impairments:   Body Structure / Function / Physical Skills: ADL, Balance, Body mechanics, Coordination, Endurance,  Flexibility, FMC, GMC, IADL, Mobility, Pain, ROM, Sensation, Strength, Tone, UE functional use       Visit Diagnosis: Muscle weakness (generalized)  Other symptoms and signs involving the nervous system  Other lack of coordination  Other symptoms and signs involving the musculoskeletal system  Unsteadiness on feet  Other abnormalities of gait and mobility    Problem List Patient Active Problem List   Diagnosis Date Noted   S/P TKR (total knee replacement), right 05/05/2019   Osteoarthritis of right knee 05/02/2019   Prostate cancer ([place]) 03/22/2018   Malignant neoplasm of prostate ([place]) 02/27/2018    Simonne Come, OT 06/21/2021, 9:32 AM  [facility] [address] [place], [place], [address] Phone: [phone]   Fax:  [phone]  Name: Matthew [name] MRN: [id number] Date of Birth: April 16, 1951

## 2021-06-20 NOTE — Therapy (Signed)
[facility] [address] [place], [place], [address] Phone: [phone]   Fax:  [phone]  Physical Therapy Treatment  Patient Details  Name: Matthew [name] MRN: [id number] Date of Birth: [birth year] Referring Provider (PT): Tat, Wells Guiles   Encounter Date: 06/20/2021   PT End of Session - 06/20/21 1539     Visit Number 3    Number of Visits 17    Date for PT Re-Evaluation 07/29/21    Authorization Type [employer]    Progress Note Due on Visit 10    PT Start Time 0349    PT Stop Time 1791    PT Time Calculation (min) 42 min    Activity Tolerance Patient tolerated treatment well    Behavior During Therapy [facility] for tasks assessed/performed;Flat affect             Past Medical History:  Diagnosis Date   Anxiety    Arthritis    GERD (gastroesophageal reflux disease)    occ remote history   Hypertension    Parkinson disease ([place])    Prostate cancer ([place])    Right knee pain    questionable meniscal tear   Seasonal allergies     Past Surgical History:  Procedure Laterality Date   COLONOSCOPY     Leg Laceration Repair  Left    Age 71   PELVIC LYMPH NODE DISSECTION Bilateral 03/22/2018   Procedure: PELVIC LYMPH NODE DISSECTION;  Surgeon: Ceasar Mons, MD;  [facility];  Service: Urology;  Laterality: Bilateral;   PROSTATE BIOPSY     ROBOT ASSISTED LAPAROSCOPIC RADICAL PROSTATECTOMY N/A 03/22/2018   Procedure: XI ROBOTIC ASSISTED LAPAROSCOPIC PROSTATECTOMY;  Surgeon: Ceasar Mons, MD;  [facility]: [facility];  Service: Urology;  Laterality: N/A;   TOTAL KNEE ARTHROPLASTY Right 05/05/2019   Procedure: RIGHT TOTAL KNEE ARTHROPLASTY;  Surgeon: Frederik Pear, MD;  Location: [facility];  Service: Orthopedics;  Laterality: Right;    There were no vitals filed for this visit.   Subjective Assessment - 06/20/21 1538     Subjective Have a few stumbles, but no falls.  Still feel unsteady.     Patient is accompained by: Family member   wife   Pertinent History Shoulder pain and back pain (?arthritis); saw orthopedist to f/u wtih R knee (pain from TKR) and mild tendonitis    Patient Stated Goals Pt's goals for therapy are to move normally and move better.  Wife-not fall.    Currently in Pain? Yes    Pain Score 2     Pain Location Back    Pain Orientation Mid;Upper    Pain Type Chronic pain    Pain Onset More than a month ago    Pain Frequency Intermittent    Aggravating Factors  repetitive motion    Pain Relieving Factors medication, positioning                       Pt performs PWR! Moves in standing position x 20 reps   PWR! Up for improved posture-cues for 3 sec hold in upright posture position  PWR! [name] for improved weighshifting  PWR! Twist for improved trunk rotation   PWR! Step for improved step initiation   Verbal, visual cues provided for technique and intensity.          [place] [facility] PT Treatment/Exercise - 06/20/21 0001       Transfers   Transfers Sit to Stand;Stand to Sit  Sit to Stand 5: Supervision    Stand to Sit 5: Supervision    Number of Reps 10 reps;2 sets    Comments From mat then from chair, cues for upright posture, knee extension upon standing.      Ambulation/Gait   Ambulation/Gait Yes    Ambulation/Gait Assistance 5: Supervision    Ambulation Distance (Feet) 300 Feet    Assistive device Other (Comment)   bilat walking poles   Gait Pattern Decreased step length - right;Decreased step length - left;Decreased arm swing - right;Decreased arm swing - left;Step-through pattern;Right flexed knee in stance;Left flexed knee in stance;Shuffle;Narrow base of support    Ambulation Surface Level;Indoor;Outdoor;Unlevel    Gait Comments Cues for sequence of walking poles and cues for widened placement of poles.  Cues to reset with PWR! Up posture      Posture/Postural Control   Posture/Postural Control Postural limitations     Postural Limitations Rounded Shoulders;Forward head;Posterior pelvic tilt;Flexed trunk                 Balance Exercises - 06/20/21 0001       Balance Exercises: Standing   Stepping Strategy Lateral;Posterior;Anterior;UE support;10 reps;Limitations    Stepping Strategy Limitations Used visual cues of mirror for foot clearance-over small obstacle side step and over tape in parallel bars (forward step).  Cues for wider BOS, but pt with narrow BOS throughout.    Other Standing Exercises Wide BOS shift and lift, BUE support x 10, then with 1 UE lift x 10 reps, cues for increased intensity/stomp to weightshift and keep feet wide.                  PT Short Term Goals - 06/20/21 1731       PT SHORT TERM GOAL #1   Title Pt will be indepedent with Parkinson's specific HEP to address balance, transfers, gait for improved overall mobility.  TARGET 07/01/2021    Time 4    Period Weeks    Status On-going      PT SHORT TERM GOAL #2   Title Pt will improve 5x sit<>stand to less than or equal to 10 sec to demonstrate improved functional strength and transfer efficiency.    Baseline 11.66 sec    Time 4    Period Weeks    Status On-going      PT SHORT TERM GOAL #3   Title Pt will improve TUG score to less than or equal to 13.5 sec for decreased fall risk.    Baseline 16.19 sec    Time 4    Period Weeks    Status On-going      PT SHORT TERM GOAL #4   Title Pt will perform push and release test in posterior direction in 2 steps or less for improved balance recovery.    Baseline 4 steps, would fall if unaided    Time 4    Period Weeks    Status On-going               PT Long Term Goals - 06/20/21 1732       PT LONG TERM GOAL #1   Title Pt will be independent with progression of HEP for Parkinson's related deficits.  TARGET 07/29/2021    Time 8    Period Weeks    Status On-going      PT LONG TERM GOAL #2   Title Pt will perform at least 8 of 10 reps of sit<>stand  from <  18" surfaces, for improved transfer efficiency and safety.    Time 8    Period Weeks    Status On-going      PT LONG TERM GOAL #3   Title Pt will improve MiniBESTest score to at least 21/28 for decreased fall risk.    Baseline 14/28    Time 8    Period Weeks    Status On-going      PT LONG TERM GOAL #4   Title Pt will improve TUG cognitive score to less than or equal to 15 sec for improved dual tasking with gait.    Baseline 18.44 sec    Time 8    Period Weeks    Status On-going      PT LONG TERM GOAL #5   Title Pt will verbalize plans for continued community fitness upon d/c from PT.    Time 8    Period Weeks    Status On-going                   Plan - 06/20/21 1727     Clinical Impression Statement Continued skilled PT session to focus on increased intensity of step strategy work for improved foot clearance with gait and stepping exercises.  Pt tends to have narrowed BOS with return to midline with step strategy exercises, with visual cues of mirror helping minimally.  He is performing standing PWR! Moves at home, as part of HEP already provided at previous bout of therapy.  Assessed PWR! Moves in standing today and he is performing well, needing cues for increased intensity of movement patterns.  He will continue to benefit from skilled PT to address functional strength, flexibility, balance, and gait towards goals of improved overall functional mobility.    Comorbidities anxiety, arthritis, GERD, HTN, R TKR    Examination-Activity Limitations Locomotion Level;Transfers;Bend;Squat;Stairs;Stand    Examination-Participation Restrictions Community Activity;Other    Stability/Clinical Decision Making Evolving/Moderate complexity    Rehab Potential Good    PT Frequency 2x / week    PT Duration 8 weeks    PT Treatment/Interventions ADLs/Self Care Home Management;Gait training;Functional mobility training;Therapeutic activities;Therapeutic exercise;Balance  training;Neuromuscular re-education;DME Instruction;Manual techniques;Passive range of motion;Patient/family education    PT Next Visit Plan Pt tends to bring his PD notebook with him of all his exercises so update HEP as needed, work on weight shifting in standing encouraging posture, sit<>stand transfers, balance-hip/ankle/step strategy work; gait training with appropriate assistive device-he does well with walking poles, just needs to reset posture and sequence at times.    Consulted and Agree with Plan of Care Patient             Patient will benefit from skilled therapeutic intervention in order to improve the following deficits and impairments:  Abnormal gait, Difficulty walking, Decreased balance, Impaired flexibility, Postural dysfunction, Decreased strength, Decreased mobility  Visit Diagnosis: Unsteadiness on feet  Other abnormalities of gait and mobility  Abnormal posture  Other symptoms and signs involving the nervous system     Problem List Patient Active Problem List   Diagnosis Date Noted   S/P TKR (total knee replacement), right 05/05/2019   Osteoarthritis of right knee 05/02/2019   Prostate cancer ([place]) 03/22/2018   Malignant neoplasm of prostate ([place]) 02/27/2018    Anahis Furgeson W., PT 06/20/2021, 5:33 PM  [facility] [address], [place], [place], [address] Phone: [phone]   Fax:  [phone]  Name: Doyne Micke MRN: [id number] Date of Birth:  03/30/1951 ° ° ° °

## 2021-06-21 DIAGNOSIS — I10 Essential (primary) hypertension: Secondary | ICD-10-CM | POA: Diagnosis not present

## 2021-06-22 ENCOUNTER — Ambulatory Visit: Payer: PPO | Attending: Neurology | Admitting: Occupational Therapy

## 2021-06-22 ENCOUNTER — Encounter: Payer: Self-pay | Admitting: Physical Therapy

## 2021-06-22 ENCOUNTER — Encounter: Payer: Self-pay | Admitting: Occupational Therapy

## 2021-06-22 ENCOUNTER — Other Ambulatory Visit: Payer: Self-pay

## 2021-06-22 ENCOUNTER — Ambulatory Visit: Payer: PPO

## 2021-06-22 ENCOUNTER — Ambulatory Visit: Payer: PPO | Admitting: Physical Therapy

## 2021-06-22 DIAGNOSIS — R2689 Other abnormalities of gait and mobility: Secondary | ICD-10-CM | POA: Insufficient documentation

## 2021-06-22 DIAGNOSIS — R41841 Cognitive communication deficit: Secondary | ICD-10-CM | POA: Diagnosis not present

## 2021-06-22 DIAGNOSIS — R471 Dysarthria and anarthria: Secondary | ICD-10-CM | POA: Diagnosis present

## 2021-06-22 DIAGNOSIS — R293 Abnormal posture: Secondary | ICD-10-CM

## 2021-06-22 DIAGNOSIS — R4701 Aphasia: Secondary | ICD-10-CM

## 2021-06-22 DIAGNOSIS — R2681 Unsteadiness on feet: Secondary | ICD-10-CM | POA: Diagnosis not present

## 2021-06-22 DIAGNOSIS — R278 Other lack of coordination: Secondary | ICD-10-CM | POA: Diagnosis not present

## 2021-06-22 DIAGNOSIS — R1310 Dysphagia, unspecified: Secondary | ICD-10-CM

## 2021-06-22 DIAGNOSIS — M6281 Muscle weakness (generalized): Secondary | ICD-10-CM | POA: Insufficient documentation

## 2021-06-22 DIAGNOSIS — R29818 Other symptoms and signs involving the nervous system: Secondary | ICD-10-CM

## 2021-06-22 NOTE — Therapy (Signed)
[facility] [address] [place], [place], [address] Phone: [phone]   Fax:  [phone]  Physical Therapy Treatment  Patient Details  Name: Matthew [name] MRN: [id number] Date of Birth: 12-30-1950 Referring Provider (PT): Tat, Wells Guiles   Encounter Date: 06/22/2021   PT End of Session - 06/22/21 1358     Visit Number 4    Number of Visits 17    Date for PT Re-Evaluation 07/29/21    Authorization Type [employer]    Progress Note Due on Visit 10    PT Start Time 1319    PT Stop Time 1357    PT Time Calculation (min) 38 min    Equipment Utilized During Treatment Gait belt    Activity Tolerance Patient tolerated treatment well    Behavior During Therapy WFL for tasks assessed/performed;Flat affect             Past Medical History:  Diagnosis Date   Anxiety    Arthritis    GERD (gastroesophageal reflux disease)    occ remote history   Hypertension    Parkinson disease ([place])    Prostate cancer ([place])    Right knee pain    questionable meniscal tear   Seasonal allergies     Past Surgical History:  Procedure Laterality Date   COLONOSCOPY     Leg Laceration Repair  Left    Age 16   PELVIC LYMPH NODE DISSECTION Bilateral 03/22/2018   Procedure: PELVIC LYMPH NODE DISSECTION;  Surgeon: Ceasar Mons, MD;  Location: [facility];  Service: Urology;  Laterality: Bilateral;   PROSTATE BIOPSY     ROBOT ASSISTED LAPAROSCOPIC RADICAL PROSTATECTOMY N/A 03/22/2018   Procedure: XI ROBOTIC ASSISTED LAPAROSCOPIC PROSTATECTOMY;  Surgeon: Ceasar Mons, MD;  Location: [facility];  Service: Urology;  Laterality: N/A;   TOTAL KNEE ARTHROPLASTY Right 05/05/2019   Procedure: RIGHT TOTAL KNEE ARTHROPLASTY;  Surgeon: Frederik Pear, MD;  [facility];  Service: Orthopedics;  Laterality: Right;    There were no vitals filed for this visit.   Subjective Assessment - 06/22/21 1317     Subjective Doing relatively  well. Had some R knee pain earlier- better with advil.    Pertinent History Shoulder pain and back pain (?arthritis); saw orthopedist to f/u wtih R knee (pain from TKR) and mild tendonitis    Patient Stated Goals Pt's goals for therapy are to move normally and move better.  Wife-not fall.    Currently in Pain? No/denies                               [facility] Adult PT Treatment/Exercise - 06/22/21 0001       Neuro Re-ed    Neuro Re-ed Details  R/L step over/back 1/2 foam roll utilizing stomping for larger amplitude- 1 episode of min A required to recover LOB, sidestepping with stomping and CGA 2x 1ft; backwards wealking in agility ladder      Knee/Hip Exercises: Standing   Other Standing Knee Exercises R/L open book stretch at wall + clap 5x each    Other Standing Knee Exercises B D2 flexion with yellow TB 10x, horizontal abduction with red TB 10x- holding below 90 deg d/t shoulder crepitus      Knee/Hip Exercises: Seated   Sit to Sand 1 set;10 reps;without UE support   chop/reverse chop with VC/s TC's for increased amplitude  PT Short Term Goals - 06/20/21 1731       PT SHORT TERM GOAL #1   Title Pt will be indepedent with Parkinson's specific HEP to address balance, transfers, gait for improved overall mobility.  TARGET 07/01/2021    Time 4    Period Weeks    Status On-going      PT SHORT TERM GOAL #2   Title Pt will improve 5x sit<>stand to less than or equal to 10 sec to demonstrate improved functional strength and transfer efficiency.    Baseline 11.66 sec    Time 4    Period Weeks    Status On-going      PT SHORT TERM GOAL #3   Title Pt will improve TUG score to less than or equal to 13.5 sec for decreased fall risk.    Baseline 16.19 sec    Time 4    Period Weeks    Status On-going      PT SHORT TERM GOAL #4   Title Pt will perform push and release test in posterior direction in 2 steps or less for improved balance  recovery.    Baseline 4 steps, would fall if unaided    Time 4    Period Weeks    Status On-going               PT Long Term Goals - 06/20/21 1732       PT LONG TERM GOAL #1   Title Pt will be independent with progression of HEP for Parkinson's related deficits.  TARGET 07/29/2021    Time 8    Period Weeks    Status On-going      PT LONG TERM GOAL #2   Title Pt will perform at least 8 of 10 reps of sit<>stand from <18" surfaces, for improved transfer efficiency and safety.    Time 8    Period Weeks    Status On-going      PT LONG TERM GOAL #3   Title Pt will improve MiniBESTest score to at least 21/28 for decreased fall risk.    Baseline 14/28    Time 8    Period Weeks    Status On-going      PT LONG TERM GOAL #4   Title Pt will improve TUG cognitive score to less than or equal to 15 sec for improved dual tasking with gait.    Baseline 18.44 sec    Time 8    Period Weeks    Status On-going      PT LONG TERM GOAL #5   Title Pt will verbalize plans for continued community fitness upon d/c from PT.    Time 8    Period Weeks    Status On-going                   Plan - 06/22/21 1359     Clinical Impression Statement Patient arrived to session with report of no new issues. Worked on STS transfers with dynamic UE movements to encourage anterior trunk lean and upright posture upon standing. Postural strengthening activities required modifications d/t L shoulder crepitus. Also worked on Dietitian activities with focus on stepping strategy. Patient required cues to increase step length and utilized stomping to increase force of movement. Patient somewhat frustrated with small occurrences of imbalance, however only required min A to regain balance 1-2 times today. Patient tolerated session well and without complaints upon leaving.    Comorbidities anxiety, arthritis, GERD,  HTN, R TKR    Examination-Activity Limitations Locomotion  Level;Transfers;Bend;Squat;Stairs;Stand    Examination-Participation Restrictions Community Activity;Other    Stability/Clinical Decision Making Evolving/Moderate complexity    Rehab Potential Good    PT Frequency 2x / week    PT Duration 8 weeks    PT Treatment/Interventions ADLs/Self Care Home Management;Gait training;Functional mobility training;Therapeutic activities;Therapeutic exercise;Balance training;Neuromuscular re-education;DME Instruction;Manual techniques;Passive range of motion;Patient/family education    PT Next Visit Plan Pt tends to bring his PD notebook with him of all his exercises so update HEP as needed, work on weight shifting in standing encouraging posture, sit<>stand transfers, balance-hip/ankle/step strategy work; gait training with appropriate assistive device-he does well with walking poles, just needs to reset posture and sequence at times.    Consulted and Agree with Plan of Care Patient             Patient will benefit from skilled therapeutic intervention in order to improve the following deficits and impairments:  Abnormal gait, Difficulty walking, Decreased balance, Impaired flexibility, Postural dysfunction, Decreased strength, Decreased mobility  Visit Diagnosis: Unsteadiness on feet  Other abnormalities of gait and mobility  Abnormal posture  Other symptoms and signs involving the nervous system     Problem List Patient Active Problem List   Diagnosis Date Noted   S/P TKR (total knee replacement), right 05/05/2019   Osteoarthritis of right knee 05/02/2019   Prostate cancer ([place]) 03/22/2018   Malignant neoplasm of prostate ([place]) 02/27/2018     Janene Harvey, PT, DPT 06/22/21 2:00 PM   [facility] [address] [place], [place], [address] Phone: [phone]   Fax:  [phone]  Name: Thaxton Pelley MRN: [id number] Date of Birth: 1951-04-11

## 2021-06-22 NOTE — Therapy (Signed)
[facility] [address] [place], [place], [address] Phone: [phone]   Fax:  [phone]  Speech Language Pathology Treatment  Patient Details  Name: Matthew [name] MRN: [id number] Date of Birth: [birth year] Referring Provider (SLP): Alonza Bogus, DO   Encounter Date: 06/22/2021   End of Session - 06/22/21 1316     Visit Number 3    Number of Visits 17    Date for SLP Re-Evaluation 08/05/21    SLP Start Time 1234    SLP Stop Time  6063    SLP Time Calculation (min) 41 min    Activity Tolerance Patient tolerated treatment well             Past Medical History:  Diagnosis Date   Anxiety    Arthritis    GERD (gastroesophageal reflux disease)    occ remote history   Hypertension    Parkinson disease ([place])    Prostate cancer ([place])    Right knee pain    questionable meniscal tear   Seasonal allergies     Past Surgical History:  Procedure Laterality Date   COLONOSCOPY     Leg Laceration Repair  Left    Age 38   PELVIC LYMPH NODE DISSECTION Bilateral 03/22/2018   Procedure: PELVIC LYMPH NODE DISSECTION;  Surgeon: Ceasar Mons, MD;  [facility];  Service: Urology;  Laterality: Bilateral;   PROSTATE BIOPSY     ROBOT ASSISTED LAPAROSCOPIC RADICAL PROSTATECTOMY N/A 03/22/2018   Procedure: XI ROBOTIC ASSISTED LAPAROSCOPIC PROSTATECTOMY;  Surgeon: Ceasar Mons, MD;  [facility];  Service: Urology;  Laterality: N/A;   TOTAL KNEE ARTHROPLASTY Right 05/05/2019   Procedure: RIGHT TOTAL KNEE ARTHROPLASTY;  Surgeon: Frederik Pear, MD;  [facility];  Service: Orthopedics;  Laterality: Right;    There were no vitals filed for this visit.   Subjective Assessment - 06/22/21 1239     Subjective "I had one of those days when you wake up and can't get back to sleep."    Currently in Pain? No/denies                   ADULT SLP TREATMENT - 06/22/21 1240       General Information    Behavior/Cognition Alert;Pleasant mood;Cooperative      Treatment Provided   Treatment provided Cognitive-Linquistic      Cognitive-Linquistic Treatment   Treatment focused on Dysarthria    Skilled Treatment SLP used loud /a/ to begin to recalibrate speech loudness in conversation - pt averaged low 90s dB. With everyday sentences he averaged mid 80s dB. In 5 minute conversational intervals pt req'd cues usually for loudness and extra effort. With pt's homework he didn't stop every 60 seconds like asked and pt lost muscle memory as a result. SLP reiterated that stopping every 60 seconds was important. SLP suggested setting a timer on his phone for 60 seconds x3-6/BID.      Assessment / Recommendations / Plan   Plan Continue with current plan of care      Progression Toward Goals   Progression toward goals Progressing toward goals                SLP Short Term Goals - 06/22/21 1314       SLP SHORT TERM GOAL #1   Title pt will produce /a/ with average upper 80s dB at 30 cm over 3 sessions    Baseline 06-15-21. 06-22-21  Time 4    Period Weeks    Status On-going    Target Date 07/01/21      SLP SHORT TERM GOAL #2   Title pt will demo abdominal breathing with sentence responses 85% accuracy x 3 sessions    Time 4    Period Weeks    Status On-going    Target Date 07/01/21      SLP SHORT TERM GOAL #3   Title pt will generate 5 minutes simple conversation with low 70s dB average in 3 sessions    Time 4    Period Weeks    Status On-going    Target Date 07/01/21      SLP SHORT TERM GOAL #4   Title pt will undergo insturmental swallow assessment (MBSS or FEES) if clinically indicated    Time 4    Period Weeks    Status On-going    Target Date 07/01/21              SLP Long Term Goals - 06/22/21 1315       SLP LONG TERM GOAL #1   Title pt will use abdominal breathing >80% of the time during 10 minute simple conversation in 3 sessions    Time 8    Period Weeks     Status On-going    Target Date 08/05/21      SLP LONG TERM GOAL #2   Title pt will maintain upper 60s dB average in 10 minute simple conversation x 3 sessions    Time 8    Period Weeks    Status On-going    Target Date 08/05/21      SLP LONG TERM GOAL #3   Title pt will report fewer requests to repeat himself than prior to ST in 3 sessions    Time 8    Period Weeks    Status On-going    Target Date 08/05/21      SLP LONG TERM GOAL #4   Title pt will produce average upper 60s dB in 10 minutes simple to mod complex conversation in 3 sessions    Time 8    Period Weeks    Status On-going    Target Date 08/05/21      SLP LONG TERM GOAL #5   Title pt will score better on a speech QOL questionairre than in the first 1-2 ST sessions    Time 8    Period Weeks    Status On-going    Target Date 08/05/21              Plan - 06/22/21 1314     Clinical Impression Statement [place]") presents today with 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 SLP believes pt will benefit from skilled ST targeting speech loudness and clarity. Additionally, pt noted to clear throat consistently with thin liquids. Pt may require an instrumental swallow evaluation during this therapy course and DO signature on this plan of care will be understood to mean that referral for MBSS or FEES is provided.    Speech Therapy Frequency 2x / week    Duration 8 weeks    Treatment/Interventions Aspiration precaution training;Language facilitation;Environmental controls;Cueing hierarchy;SLP instruction and feedback;Compensatory techniques;Functional tasks;Compensatory strategies;Patient/family education;Diet toleration management by SLP;Other (comment)    Potential to Achieve  Goals Good    SLP  Home Exercise Plan provided    Consulted and Agree with Plan of Care Patient             Patient will benefit from skilled therapeutic intervention in order to improve the following deficits and impairments:   Dysarthria and anarthria  Dysphagia, unspecified type  Aphasia  Cognitive communication deficit    Problem List Patient Active Problem List   Diagnosis Date Noted   S/P TKR (total knee replacement), right 05/05/2019   Osteoarthritis of right knee 05/02/2019   Prostate cancer ([place]) 03/22/2018   Malignant neoplasm of prostate ([place]) 02/27/2018    [place], [place] 06/22/2021, 1:16 PM  [facility] [address] [place], [place], [address] Phone: [phone]   Fax:  [phone]   Name: Areg Bialas MRN: [id number] Date of Birth: [birth year]

## 2021-06-22 NOTE — Therapy (Signed)
[facility] [address], [place], [place], [address] Phone: [phone]   Fax:  [phone]  Occupational Therapy Treatment  Patient Details  Name: Matthew [name] MRN: [id number] Date of Birth: 07-21-1950 Referring Provider (OT): Tat, Eustace Quail, DO   Encounter Date: 06/22/2021   OT End of Session - 06/22/21 1457     Visit Number 4    Number of Visits 17    Date for OT Re-Evaluation 07/29/21    Authorization Type [employer]    OT Start Time 1455    OT Stop Time 1533    OT Time Calculation (min) 38 min    Activity Tolerance Patient tolerated treatment well    Behavior During Therapy [facility] for tasks assessed/performed;Flat affect             Past Medical History:  Diagnosis Date   Anxiety    Arthritis    GERD (gastroesophageal reflux disease)    occ remote history   Hypertension    Parkinson disease ([place])    Prostate cancer ([place])    Right knee pain    questionable meniscal tear   Seasonal allergies     Past Surgical History:  Procedure Laterality Date   COLONOSCOPY     Leg Laceration Repair  Left    Age 71   PELVIC LYMPH NODE DISSECTION Bilateral 03/22/2018   Procedure: PELVIC LYMPH NODE DISSECTION;  Surgeon: Ceasar Mons, MD;  [facility];  Service: Urology;  Laterality: Bilateral;   PROSTATE BIOPSY     ROBOT ASSISTED LAPAROSCOPIC RADICAL PROSTATECTOMY N/A 03/22/2018   Procedure: XI ROBOTIC ASSISTED LAPAROSCOPIC PROSTATECTOMY;  Surgeon: Ceasar Mons, MD;  [facility];  Service: Urology;  Laterality: N/A;   TOTAL KNEE ARTHROPLASTY Right 05/05/2019   Procedure: RIGHT TOTAL KNEE ARTHROPLASTY;  Surgeon: Frederik Pear, MD;  [facility];  Service: Orthopedics;  Laterality: Right;    There were no vitals filed for this visit.   Subjective Assessment - 06/22/21 1455     Subjective  Pt reports using "cape" technique with swinging towel around back to dry back with  improved success.  Pt also reports donning socks a "little bit faster".    Patient is accompanied by: Family member    Pertinent History anxiety, arthritis, GERD, HTN, R TKR    Patient Stated Goals to be able to function better    Currently in Pain? No/denies    Pain Onset More than a month ago              ADL: Pt reports difficulty with scooting chair in to table.  Engaged in problem solving and blocked practice to pull chair in to table.  Pt most successful with standing with one hand on table and then reaching back to pull one side of chair and then switching hands to pull other side of chair forward.    ADL/self-feeding: Engaged in simulated eating/self-feeding.  Pt reports increased difficulty with use of fork, items falling off or difficulty "stabbing" meat.  Discussed purposeful movement to increase success and decrease onset of tremors and small movements.  Educated on big open hand with picking up cup and bringing cup to mouth.  Pt able to return demonstration of each task, demonstrating good placement of spoon in hand with scooping items and improved positioning of hand on cup.  Pt will continue to benefit from practice with self-feeding tasks.  OT Short Term Goals - 06/22/21 1547       OT SHORT TERM GOAL #1   Title Pt will be Independent with PD specific HEP    Time 4    Period Weeks    Status On-going    Target Date 07/01/21      OT SHORT TERM GOAL #2   Title Pt will verbalize understanding of adapted strategies to maximize safety and I with ADLs/ IADLs .    Time 4    Period Weeks    Status On-going      OT SHORT TERM GOAL #3   Title Pt will retrieve a lightweight object from overhead shelf at 130 shoulder flexion and -5 elbow extension with left and right UE's    Baseline R: shoulder 124 and elbow -16  L: shoulder 124 and elbow - 8    Time 4    Period Weeks    Status On-going      OT SHORT TERM GOAL #4   Title Pt will  demonstrate increased ease with dressing as evidenced by decreasing PPT#4(don/ doff jacket) by 30 seconds    Baseline > 2 mins.  06/15/21 1:36.37    Time 4    Period Weeks    Status On-going               OT Long Term Goals - 06/15/21 1056       OT LONG TERM GOAL #1   Title Pt will verbalize understanding of ways to prevent future PD related complications and PD community resources following review    Time 8    Period Weeks    Status On-going    Target Date 07/29/21      OT LONG TERM GOAL #2   Title Pt will demonstrate improved fine motor coordination for ADLs as evidenced by decreasing 9 hole peg test score for RUE by 3 secs    Baseline R: 46.28 and L: 46.75    Time 8    Period Weeks    Status On-going      OT LONG TERM GOAL #3   Title Pt will demonstrate improved ease with dressing as eveidenced by decreasing 3 button/ unbutton time to 2 min or less    Baseline 4:18.1    Time 8    Period Weeks    Status On-going      OT LONG TERM GOAL #4   Title Pt will report pain in right shoulder no greater than 2/10 for ADLs    Time 8    Period Weeks    Status On-going      OT LONG TERM GOAL #5   Title Pt will demonstrate improved ease with feeding as evidenced by decreasing PPT#2 (self feeding) by 3 secs    Baseline 20.81    Time 8    Period Weeks    Status On-going                   Plan - 06/22/21 1457     Clinical Impression Statement Pt reports wife has ideas/suggestions for tasks to complete during OT sessions.  Therapist encouraged pt to have her write them down and bring them in. Pt receptive to education on large amplitude, intentional movements during a variety of self-feeding tasks.  Pt demonstrating improved success with attention to technique.  Pt reports improved success with drying back and donning jacket with "cape" technique, reports it feels awkward but has improved his succes with these  self-care tasks.    OT Occupational Profile and History  Detailed Assessment- Review of Records and additional review of physical, cognitive, psychosocial history related to current functional performance    Occupational performance deficits (Please refer to evaluation for details): ADL's;IADL's;Leisure    Body Structure / Function / Physical Skills ADL;Balance;Body mechanics;Coordination;Endurance;Flexibility;FMC;GMC;IADL;Mobility;Pain;ROM;Sensation;Strength;Tone;UE functional use    Rehab Potential Fair    Clinical Decision Making Limited treatment options, no task modification necessary    Comorbidities Affecting Occupational Performance: May have comorbidities impacting occupational performance    Modification or Assistance to Complete Evaluation  No modification of tasks or assist necessary to complete eval    OT Frequency 2x / week    OT Duration 8 weeks    OT Treatment/Interventions Self-care/ADL training;Cryotherapy;Electrical Stimulation;Moist Heat;Ultrasound;Iontophoresis;Therapeutic exercise;Neuromuscular education;Energy conservation;DME and/or AE instruction;Functional Mobility Training;Manual Therapy;Passive range of motion;Therapeutic activities;Cognitive remediation/compensation;Patient/family education;Balance training;Psychosocial skills training    Plan jacket, self-feeding, buttons/other [place] tasks, shoulder pain/stretches and HEP    Consulted and Agree with Plan of Care Patient             Patient will benefit from skilled therapeutic intervention in order to improve the following deficits and impairments:   Body Structure / Function / Physical Skills: ADL, Balance, Body mechanics, Coordination, Endurance, Flexibility, FMC, GMC, IADL, Mobility, Pain, ROM, Sensation, Strength, Tone, UE functional use       Visit Diagnosis: Muscle weakness (generalized)  Unsteadiness on feet  Other abnormalities of gait and mobility  Abnormal posture  Other symptoms and signs involving the nervous system  Other lack of  coordination    Problem List Patient Active Problem List   Diagnosis Date Noted   S/P TKR (total knee replacement), right 05/05/2019   Osteoarthritis of right knee 05/02/2019   Prostate cancer ([place]) 03/22/2018   Malignant neoplasm of prostate ([place]) 02/27/2018    Simonne Come, OT 06/22/2021, 3:48 PM  [facility] [address], [place], [place], [address] Phone: [phone]   Fax:  [phone]  Name: Matthew [name] MRN: [id number] Date of Birth: 10/11/1950

## 2021-06-27 ENCOUNTER — Ambulatory Visit: Payer: PPO | Admitting: Occupational Therapy

## 2021-06-27 ENCOUNTER — Ambulatory Visit: Payer: PPO | Admitting: Physical Therapy

## 2021-06-27 ENCOUNTER — Encounter: Payer: Self-pay | Admitting: Physical Therapy

## 2021-06-27 ENCOUNTER — Encounter: Payer: Self-pay | Admitting: Occupational Therapy

## 2021-06-27 ENCOUNTER — Other Ambulatory Visit: Payer: Self-pay

## 2021-06-27 DIAGNOSIS — R293 Abnormal posture: Secondary | ICD-10-CM

## 2021-06-27 DIAGNOSIS — R278 Other lack of coordination: Secondary | ICD-10-CM

## 2021-06-27 DIAGNOSIS — M6281 Muscle weakness (generalized): Secondary | ICD-10-CM | POA: Diagnosis not present

## 2021-06-27 DIAGNOSIS — R29818 Other symptoms and signs involving the nervous system: Secondary | ICD-10-CM

## 2021-06-27 DIAGNOSIS — R2681 Unsteadiness on feet: Secondary | ICD-10-CM

## 2021-06-27 NOTE — Patient Instructions (Addendum)
Putting on/taking off jacket/vest  Putting on jacket:  turn jacket to face away from you.  Place hands at tops of jacket above arm holes.  Big swinging capelike motion towards R, placing R arm in first with big punch like movement.  Then continue around to place L arm in hole.    Taking off jacket: Place hands down low on each side of jacket.  Big movement out to L, turning head and body so it slides off the shoulder.  Then Big movement out to R, turning head and body so it slides off the shoulder.

## 2021-06-27 NOTE — Patient Instructions (Addendum)
Sidestep in and sit on edge of chair. Lean forward, lifting bottom off the chair, with "face in the plate" and pull the chair forward.  Then sit in the chair. Repeat several times, taking a break in between until you scoot up close enough to the table.   To get away from the table: Tuck your feet, lean forward, lift bottom of the chair, with "face in the plate" and push the chair backward.  Then sit in the chair. Repeat several times, taking a break in between until you scoot far enough away to be able to stand up.   Access Code: [email or web address] URL: [email or web address] Date: 06/27/2021 Prepared by: [facility]  Exercises Sit to Stand - 1 x daily - 5 x weekly - 2 sets - 5 reps Seated Hamstring Stretch - 1-2 x daily - 7 x weekly - 1 sets - 3 reps - 30 sec hold Seated Ankle Dorsiflexion AROM - 1-2 x daily - 7 x weekly - 2 sets - 10 reps Standing Quarter Turn with Counter Support - 1 x daily - 5 x weekly - 1-2 sets - 10 reps

## 2021-06-27 NOTE — Therapy (Signed)
[facility] [address], [place], [place], [address] Phone: [phone]   Fax:  [phone]  Physical Therapy Treatment  Patient Details  Name: Matthew [name] MRN: [id number] Date of Birth: 10/02/1950 Referring Provider (PT): Tat, Wells Guiles   Encounter Date: 06/27/2021   PT End of Session - 06/27/21 1712     Visit Number 5    Number of Visits 17    Date for PT Re-Evaluation 07/29/21    Authorization Type [employer]    Progress Note Due on Visit 10    PT Start Time 1619    PT Stop Time 1700    PT Time Calculation (min) 41 min    Activity Tolerance Patient tolerated treatment well    Behavior During Therapy [facility] for tasks assessed/performed;Flat affect             Past Medical History:  Diagnosis Date   Anxiety    Arthritis    GERD (gastroesophageal reflux disease)    occ remote history   Hypertension    Parkinson disease ([place])    Prostate cancer ([place])    Right knee pain    questionable meniscal tear   Seasonal allergies     Past Surgical History:  Procedure Laterality Date   COLONOSCOPY     Leg Laceration Repair  Left    Age 71   PELVIC LYMPH NODE DISSECTION Bilateral 03/22/2018   Procedure: PELVIC LYMPH NODE DISSECTION;  Surgeon: Ceasar Mons, MD;  [facility]: [facility];  Service: Urology;  Laterality: Bilateral;   PROSTATE BIOPSY     ROBOT ASSISTED LAPAROSCOPIC RADICAL PROSTATECTOMY N/A 03/22/2018   Procedure: XI ROBOTIC ASSISTED LAPAROSCOPIC PROSTATECTOMY;  Surgeon: Ceasar Mons, MD;  [facility]: [facility];  Service: Urology;  Laterality: N/A;   TOTAL KNEE ARTHROPLASTY Right 05/05/2019   Procedure: RIGHT TOTAL KNEE ARTHROPLASTY;  Surgeon: Frederik Pear, MD;  [facility];  Service: Orthopedics;  Laterality: Right;    There were no vitals filed for this visit.   Subjective Assessment - 06/27/21 1626     Subjective Still having trouble with scooting up to table at home.     Pertinent History Shoulder pain and back pain (?arthritis); saw orthopedist to f/u wtih R knee (pain from TKR) and mild tendonitis    Patient Stated Goals Pt's goals for therapy are to move normally and move better.  Wife-not fall.    Currently in Pain? Yes    Pain Score 3     Pain Location Back    Pain Orientation Upper;Mid    Pain Descriptors / Indicators Aching;Tightness    Pain Type Chronic pain    Pain Onset More than a month ago    Pain Frequency Intermittent    Aggravating Factors  repetitive motion    Pain Relieving Factors medication, positioning                               [facility] Adult PT Treatment/Exercise - 06/27/21 0001       Transfers   Transfers Sit to Stand;Stand to Sit    Sit to Stand 5: Supervision;From bed;From chair/3-in-1    Stand to Sit 5: Supervision;To bed;To chair/3-in-1    Number of Reps 10 reps;2 sets   Standing on solid surface, then on Airex, holding 2.2# weighted ball at trunk x 5 reps of each surface   Transfer Cueing Cues for big effort for increased foward  lean    Comments Practiced scooting chair up to and away from table, using forward lean, "face in the plate" method (pulling chair forward while buttocks in air); repeated up to and away from table x 4 reps throughout session, problem-solving method for getting up to/away from table for meals.      Neuro Re-ed    Neuro Re-ed Details  Seated weightshifting, lateral rocking to lift feet, for ease of scooting into position in chair at table, L and R, 2 reps each, with difficulty.                 Balance Exercises - 06/27/21 0001       Balance Exercises: Standing   Stepping Strategy Posterior;UE support;10 reps;Lateral    Turning 5 reps;Limitations;Right;Left    Turning Limitations Quarter turns, 2 sets x 5 reps R>center>L>center.  Additional practice using quarter turns 180 degrees for improved stability with turns.    Other Standing Exercises Wide BOS  anterior/posterior weightshifting x 10 reps at counter                PT Education - 06/27/21 1711     Education Details transfers/scooting up to and away from table; addition to HEP-quarter turns    Person(s) Educated Patient    Methods Explanation;Demonstration;Handout;Verbal cues    Comprehension Verbalized understanding;Returned demonstration;Verbal cues required              PT Short Term Goals - 06/20/21 1731       PT SHORT TERM GOAL #1   Title Pt will be indepedent with Parkinson's specific HEP to address balance, transfers, gait for improved overall mobility.  TARGET 07/01/2021    Time 4    Period Weeks    Status On-going      PT SHORT TERM GOAL #2   Title Pt will improve 5x sit<>stand to less than or equal to 10 sec to demonstrate improved functional strength and transfer efficiency.    Baseline 11.66 sec    Time 4    Period Weeks    Status On-going      PT SHORT TERM GOAL #3   Title Pt will improve TUG score to less than or equal to 13.5 sec for decreased fall risk.    Baseline 16.19 sec    Time 4    Period Weeks    Status On-going      PT SHORT TERM GOAL #4   Title Pt will perform push and release test in posterior direction in 2 steps or less for improved balance recovery.    Baseline 4 steps, would fall if unaided    Time 4    Period Weeks    Status On-going               PT Long Term Goals - 06/20/21 1732       PT LONG TERM GOAL #1   Title Pt will be independent with progression of HEP for Parkinson's related deficits.  TARGET 07/29/2021    Time 8    Period Weeks    Status On-going      PT LONG TERM GOAL #2   Title Pt will perform at least 8 of 10 reps of sit<>stand from <18" surfaces, for improved transfer efficiency and safety.    Time 8    Period Weeks    Status On-going      PT LONG TERM GOAL #3   Title Pt will improve MiniBESTest score to at least 21/28  for decreased fall risk.    Baseline 14/28    Time 8    Period Weeks     Status On-going      PT LONG TERM GOAL #4   Title Pt will improve TUG cognitive score to less than or equal to 15 sec for improved dual tasking with gait.    Baseline 18.44 sec    Time 8    Period Weeks    Status On-going      PT LONG TERM GOAL #5   Title Pt will verbalize plans for continued community fitness upon d/c from PT.    Time 8    Period Weeks    Status On-going                   Plan - 06/27/21 1712     Clinical Impression Statement Pt with minimal c/o back pain today; with brief rest breaks between exercises, pt uses pillow in chair for supported positioning to assist with more upright posture.  Focused today on functional mobility with transfers and step strategies in posterior direction, initiation of turns.  Pt does well with cues for movement patterns, especially when put into functional context.  He will continue to benefit from further skilled PT towards goals for improved overall functional mobility and decreased fall risk.    Comorbidities anxiety, arthritis, GERD, HTN, R TKR    Examination-Activity Limitations Locomotion Level;Transfers;Bend;Squat;Stairs;Stand    Examination-Participation Restrictions Community Activity;Other    Stability/Clinical Decision Making Evolving/Moderate complexity    Rehab Potential Good    PT Frequency 2x / week    PT Duration 8 weeks    PT Treatment/Interventions ADLs/Self Care Home Management;Gait training;Functional mobility training;Therapeutic activities;Therapeutic exercise;Balance training;Neuromuscular re-education;DME Instruction;Manual techniques;Passive range of motion;Patient/family education    PT Next Visit Plan Check pt's HEP additions; check STGs next visit; further work on balance recovery.  How did transfers go at home?    Consulted and Agree with Plan of Care Patient             Patient will benefit from skilled therapeutic intervention in order to improve the following deficits and impairments:   Abnormal gait, Difficulty walking, Decreased balance, Impaired flexibility, Postural dysfunction, Decreased strength, Decreased mobility  Visit Diagnosis: Unsteadiness on feet  Other symptoms and signs involving the nervous system  Abnormal posture     Problem List Patient Active Problem List   Diagnosis Date Noted   S/P TKR (total knee replacement), right 05/05/2019   Osteoarthritis of right knee 05/02/2019   Prostate cancer ([place]) 03/22/2018   Malignant neoplasm of prostate ([place]) 02/27/2018    Jereme Loren W., PT 06/27/2021, 5:17 PM  [facility] [address] [place], [place], [address] Phone: [phone]   Fax:  [phone]  Name: Matthew [name] MRN: [id number] Date of Birth: April 18, 1951

## 2021-06-27 NOTE — Therapy (Signed)
[facility] [address] [place], [place], [address] Phone: [phone]   Fax:  [phone]  Occupational Therapy Treatment  Patient Details  Name: Matthew [name] MRN: [id number] Date of Birth: November 29, 1950 Referring Provider (OT): Tat, Eustace Quail, DO   Encounter Date: 06/27/2021   OT End of Session - 06/27/21 1629     Visit Number 5    Number of Visits 17    Date for OT Re-Evaluation 07/29/21    Authorization Type [employer]    OT Start Time 1535    OT Stop Time 1618    OT Time Calculation (min) 43 min    Activity Tolerance Patient tolerated treatment well    Behavior During Therapy [facility] for tasks assessed/performed;Flat affect             Past Medical History:  Diagnosis Date   Anxiety    Arthritis    GERD (gastroesophageal reflux disease)    occ remote history   Hypertension    Parkinson disease ([place])    Prostate cancer ([place])    Right knee pain    questionable meniscal tear   Seasonal allergies     Past Surgical History:  Procedure Laterality Date   COLONOSCOPY     Leg Laceration Repair  Left    Age 71   PELVIC LYMPH NODE DISSECTION Bilateral 03/22/2018   Procedure: PELVIC LYMPH NODE DISSECTION;  Surgeon: Ceasar Mons, MD;  [facility];  Service: Urology;  Laterality: Bilateral;   PROSTATE BIOPSY     ROBOT ASSISTED LAPAROSCOPIC RADICAL PROSTATECTOMY N/A 03/22/2018   Procedure: XI ROBOTIC ASSISTED LAPAROSCOPIC PROSTATECTOMY;  Surgeon: Ceasar Mons, MD;  [facility];  Service: Urology;  Laterality: N/A;   TOTAL KNEE ARTHROPLASTY Right 05/05/2019   Procedure: RIGHT TOTAL KNEE ARTHROPLASTY;  Surgeon: Frederik Pear, MD;  [facility];  Service: Orthopedics;  Laterality: Right;    There were no vitals filed for this visit.   Subjective Assessment - 06/27/21 1538     Subjective  Pt reports that he is able to recall education from sessions, but that he frequently is in  a hurry and then resumes use of small "sawing" motions instead of large "cape" motions with donning jacket.    Patient is accompanied by: Family member    Pertinent History anxiety, arthritis, GERD, HTN, R TKR    Patient Stated Goals to be able to function better    Currently in Pain? Yes    Pain Score 5     Pain Location Back    Pain Orientation Mid;Upper    Pain Descriptors / Indicators Aching;Tightness    Pain Type Chronic pain    Pain Onset More than a month ago    Aggravating Factors  repetitive motion    Pain Relieving Factors medication, positioning              ADL: Donning/doffing jacket.  Engaged in blocked practice of doffing and donning personal vest with focus on large amplitude movements.  Utilized Geologist, engineering for visual feedback of increased arm movement and trunk rotation.  Pt with improved success with visual attention to increased amplitude of movement.  Written instructions provided for increased carryover at home.  ADL: Pt and wife report pt with continued difficulty scooting chair in to table.  Pt frequently sits to R edge of chair without full rotation to middle of chair due to difficulty with scooting.  Engaged in various attempts at problem solving  chair positioning at table.  Pt with most success scooting chair back from table and then completing partial stand or full stand at table and reaching down with one hand or both hands if sturdy enough and scooting chair in towards self.  Pt reports having a rug under the chair at home, adding resistance.  Placed towel underneath chair to increase resistance.  Pt able to complete with multiple attempts, still with small movements when attempting to slide chair forward.                        OT Short Term Goals - 06/22/21 1547       OT SHORT TERM GOAL #1   Title Pt will be Independent with PD specific HEP    Time 4    Period Weeks    Status On-going    Target Date 07/01/21      OT SHORT TERM GOAL #2    Title Pt will verbalize understanding of adapted strategies to maximize safety and I with ADLs/ IADLs .    Time 4    Period Weeks    Status On-going      OT SHORT TERM GOAL #3   Title Pt will retrieve a lightweight object from overhead shelf at 130 shoulder flexion and -5 elbow extension with left and right UE's    Baseline R: shoulder 124 and elbow -16  L: shoulder 124 and elbow - 8    Time 4    Period Weeks    Status On-going      OT SHORT TERM GOAL #4   Title Pt will demonstrate increased ease with dressing as evidenced by decreasing PPT#4(don/ doff jacket) by 30 seconds    Baseline > 2 mins.  06/15/21 1:36.37    Time 4    Period Weeks    Status On-going               OT Long Term Goals - 06/15/21 1056       OT LONG TERM GOAL #1   Title Pt will verbalize understanding of ways to prevent future PD related complications and PD community resources following review    Time 8    Period Weeks    Status On-going    Target Date 07/29/21      OT LONG TERM GOAL #2   Title Pt will demonstrate improved fine motor coordination for ADLs as evidenced by decreasing 9 hole peg test score for RUE by 3 secs    Baseline R: 46.28 and L: 46.75    Time 8    Period Weeks    Status On-going      OT LONG TERM GOAL #3   Title Pt will demonstrate improved ease with dressing as eveidenced by decreasing 3 button/ unbutton time to 2 min or less    Baseline 4:18.1    Time 8    Period Weeks    Status On-going      OT LONG TERM GOAL #4   Title Pt will report pain in right shoulder no greater than 2/10 for ADLs    Time 8    Period Weeks    Status On-going      OT LONG TERM GOAL #5   Title Pt will demonstrate improved ease with feeding as evidenced by decreasing PPT#2 (self feeding) by 3 secs    Baseline 20.81    Time 8    Period Weeks    Status On-going  Plan - 06/27/21 1631     Clinical Impression Statement Pt brought in list from wife with issues that  she notices that he has difficulty with .  Pt benefited from use of mirror for visual feedback with large amplitude and trunk rotation movements during donning/doffing personal vest.  Therapist provided written instructions to facilitate increased recall and carryover at home.  Pt continues to demonstrate decreased success with transitional movements and ability to scoot chair in to table to increase positioning at table for self-feeding.    OT Occupational Profile and History Detailed Assessment- Review of Records and additional review of physical, cognitive, psychosocial history related to current functional performance    Occupational performance deficits (Please refer to evaluation for details): ADL's;IADL's;Leisure    Body Structure / Function / Physical Skills ADL;Balance;Body mechanics;Coordination;Endurance;Flexibility;FMC;GMC;IADL;Mobility;Pain;ROM;Sensation;Strength;Tone;UE functional use    Rehab Potential Fair    Clinical Decision Making Limited treatment options, no task modification necessary    Comorbidities Affecting Occupational Performance: May have comorbidities impacting occupational performance    Modification or Assistance to Complete Evaluation  No modification of tasks or assist necessary to complete eval    OT Frequency 2x / week    OT Duration 8 weeks    OT Treatment/Interventions Self-care/ADL training;Cryotherapy;Electrical Stimulation;Moist Heat;Ultrasound;Iontophoresis;Therapeutic exercise;Neuromuscular education;Energy conservation;DME and/or AE instruction;Functional Mobility Training;Manual Therapy;Passive range of motion;Therapeutic activities;Cognitive remediation/compensation;Patient/family education;Balance training;Psychosocial skills training    Plan jacket, shoulder pain/stretches and HEP, scooting chair in to table.    Consulted and Agree with Plan of Care Patient             Patient will benefit from skilled therapeutic intervention in order to improve  the following deficits and impairments:   Body Structure / Function / Physical Skills: ADL, Balance, Body mechanics, Coordination, Endurance, Flexibility, FMC, GMC, IADL, Mobility, Pain, ROM, Sensation, Strength, Tone, UE functional use       Visit Diagnosis: Muscle weakness (generalized)  Other lack of coordination  Other symptoms and signs involving the nervous system  Abnormal posture  Unsteadiness on feet    Problem List Patient Active Problem List   Diagnosis Date Noted   S/P TKR (total knee replacement), right 05/05/2019   Osteoarthritis of right knee 05/02/2019   Prostate cancer ([place]) 03/22/2018   Malignant neoplasm of prostate ([place]) 02/27/2018    Simonne Come, OT 06/27/2021, 4:33 PM  [facility] [address] [place], [place], [address] Phone: [phone]   Fax:  [phone]  Name: Matthew [name] MRN: [id number] Date of Birth: 05-Nov-1950

## 2021-06-28 ENCOUNTER — Encounter: Payer: Self-pay | Admitting: Occupational Therapy

## 2021-06-28 ENCOUNTER — Ambulatory Visit: Payer: PPO

## 2021-06-28 ENCOUNTER — Ambulatory Visit: Payer: PPO | Admitting: Occupational Therapy

## 2021-06-28 DIAGNOSIS — R471 Dysarthria and anarthria: Secondary | ICD-10-CM

## 2021-06-28 DIAGNOSIS — R41841 Cognitive communication deficit: Secondary | ICD-10-CM

## 2021-06-28 DIAGNOSIS — R2681 Unsteadiness on feet: Secondary | ICD-10-CM

## 2021-06-28 DIAGNOSIS — M6281 Muscle weakness (generalized): Secondary | ICD-10-CM | POA: Diagnosis not present

## 2021-06-28 DIAGNOSIS — R278 Other lack of coordination: Secondary | ICD-10-CM

## 2021-06-28 DIAGNOSIS — R4701 Aphasia: Secondary | ICD-10-CM

## 2021-06-28 DIAGNOSIS — R1310 Dysphagia, unspecified: Secondary | ICD-10-CM

## 2021-06-28 DIAGNOSIS — R293 Abnormal posture: Secondary | ICD-10-CM

## 2021-06-28 DIAGNOSIS — R29818 Other symptoms and signs involving the nervous system: Secondary | ICD-10-CM

## 2021-06-28 NOTE — Therapy (Signed)
[facility] [address] [place], [place], [address] Phone: [phone]   Fax:  [phone]  Speech Language Pathology Treatment  Patient Details  Name: Matthew [name] MRN: [id number] Date of Birth: [birth year] Referring Provider (SLP): Alonza Bogus, DO   Encounter Date: 06/28/2021   End of Session - 06/28/21 1522     Visit Number 4    Number of Visits 17    Date for SLP Re-Evaluation 08/05/21    SLP Start Time 59    SLP Stop Time  7989    SLP Time Calculation (min) 43 min    Activity Tolerance Patient tolerated treatment well             Past Medical History:  Diagnosis Date   Anxiety    Arthritis    GERD (gastroesophageal reflux disease)    occ remote history   Hypertension    Parkinson disease ([place])    Prostate cancer ([place])    Right knee pain    questionable meniscal tear   Seasonal allergies     Past Surgical History:  Procedure Laterality Date   COLONOSCOPY     Leg Laceration Repair  Left    Age 71   PELVIC LYMPH NODE DISSECTION Bilateral 03/22/2018   Procedure: PELVIC LYMPH NODE DISSECTION;  Surgeon: Ceasar Mons, MD;  [facility];  Service: Urology;  Laterality: Bilateral;   PROSTATE BIOPSY     ROBOT ASSISTED LAPAROSCOPIC RADICAL PROSTATECTOMY N/A 03/22/2018   Procedure: XI ROBOTIC ASSISTED LAPAROSCOPIC PROSTATECTOMY;  Surgeon: Ceasar Mons, MD;  [facility];  Service: Urology;  Laterality: N/A;   TOTAL KNEE ARTHROPLASTY Right 05/05/2019   Procedure: RIGHT TOTAL KNEE ARTHROPLASTY;  Surgeon: Frederik Pear, MD;  [facility];  Service: Orthopedics;  Laterality: Right;    There were no vitals filed for this visit.   Subjective Assessment - 06/28/21 1519     Subjective "Hi Matthew [name]" (loud voice)    Currently in Pain? No/denies                   ADULT SLP TREATMENT - 06/28/21 1520       [name] Information   Behavior/Cognition Alert;Pleasant  mood;Cooperative      Treatment Provided   Treatment provided Cognitive-Linquistic      Cognitive-Linquistic Treatment   Treatment focused on Dysarthria    Skilled Treatment SLP engaged pt in mod complex conversation for 35 minutes re: many topics related to live music - pt maintained WNL loudness 90% of the time, but in last 10 minutes pt had more frequent decreases in loudness into upper 60s dB and pt spontaneously incr to lower 70s dB each time.      Assessment / Recommendations / Plan   Plan Continue with current plan of care      Progression Toward Goals   Progression toward goals Progressing toward goals                SLP Short Term Goals - 06/28/21 1524       SLP SHORT TERM GOAL #1   Title pt will produce /a/ with average upper 80s dB at 30 cm over 3 sessions    Baseline 06-15-21. 06-22-21    Time 4    Period Weeks    Status On-going    Target Date 07/01/21      SLP SHORT TERM GOAL #2   Title pt will demo abdominal breathing with sentence responses 85%  accuracy x 3 sessions    Time 4    Period Weeks    Status On-going    Target Date 07/01/21      SLP SHORT TERM GOAL #3   Title pt will generate 5 minutes simple conversation with low 70s dB average in 3 sessions    Baseline 06-28-21    Time 4    Period Weeks    Status On-going    Target Date 07/01/21      SLP SHORT TERM GOAL #4   Title pt will undergo insturmental swallow assessment (MBSS or FEES) if clinically indicated    Time 4    Period Weeks    Status On-going    Target Date 07/01/21              SLP Long Term Goals - 06/28/21 1524       SLP LONG TERM GOAL #1   Title pt will use abdominal breathing >80% of the time during 10 minute simple conversation in 3 sessions    Time 8    Period Weeks    Status On-going    Target Date 08/05/21      SLP LONG TERM GOAL #2   Title pt will maintain upper 60s dB average in 10 minute simple conversation x 3 sessions    Baseline 06-28-21    Time 8     Period Weeks    Status On-going    Target Date 08/05/21      SLP LONG TERM GOAL #3   Title pt will report fewer requests to repeat himself than prior to ST in 3 sessions    Time 8    Period Weeks    Status On-going    Target Date 08/05/21      SLP LONG TERM GOAL #4   Title pt will produce average upper 60s dB in 10 minutes simple to mod complex conversation in 3 sessions    Baseline 06-28-21    Time 8    Period Weeks    Status On-going    Target Date 08/05/21      SLP LONG TERM GOAL #5   Title pt will score better on a speech QOL questionairre than in the first 1-2 ST sessions    Time 8    Period Weeks    Status On-going    Target Date 08/05/21              Plan - 06/28/21 1522     Clinical Impression Statement Matthew [name] [facility]") presents today with 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 Today pt was able to speak for 25-30 mintues with WNL volume in a myridad of topics around live music. SLP believes pt will cont to benefit from skilled ST targeting speech loudness and clarity. Additionally, pt noted to clear throat consistently with thin liquids. Pt may require an instrumental swallow evaluation during this therapy course and DO signature on this plan of care will be understood to mean that referral for MBSS or FEES is provided.    Speech Therapy Frequency 2x / week    Duration 8 weeks    Treatment/Interventions Aspiration precaution training;Language facilitation;Environmental controls;Cueing hierarchy;SLP instruction and feedback;Compensatory techniques;Functional tasks;Compensatory strategies;Patient/family education;Diet toleration management by SLP;Other (comment)    Potential to Achieve Goals Good    SLP Home Exercise Plan provided    Consulted and Agree with Plan of Care Patient  Patient will benefit from skilled therapeutic intervention in order to improve the following  deficits and impairments:   Dysarthria and anarthria  Dysphagia, unspecified type  Aphasia  Cognitive communication deficit    Problem List Patient Active Problem List   Diagnosis Date Noted   S/P TKR (total knee replacement), right 05/05/2019   Osteoarthritis of right knee 05/02/2019   Prostate cancer ([place]) 03/22/2018   Malignant neoplasm of prostate ([place]) 02/27/2018    [place], [place] 06/28/2021, 3:25 PM  [facility] [address] [place], [place], [address] Phone: [phone]   Fax:  [phone]   Name: Matthew [name] MRN: [id number] Date of Birth: 1950/11/13

## 2021-06-28 NOTE — Therapy (Signed)
[facility] [address] [place], [place], [address] Phone: [phone]   Fax:  [phone]  Occupational Therapy Treatment  Patient Details  Name: Matthew [name] MRN: [id number] Date of Birth: [birth year] Referring Provider (OT): Tat, Eustace Quail, DO   Encounter Date: 06/28/2021   OT End of Session - 06/28/21 1321     Visit Number 6    Number of Visits 17    Date for OT Re-Evaluation 07/29/21    Authorization Type [employer]    OT Start Time 1320    OT Stop Time 1403    OT Time Calculation (min) 43 min    Activity Tolerance Patient tolerated treatment well    Behavior During Therapy [facility] for tasks assessed/performed;Flat affect             Past Medical History:  Diagnosis Date   Anxiety    Arthritis    GERD (gastroesophageal reflux disease)    occ remote history   Hypertension    Parkinson disease ([place])    Prostate cancer ([place])    Right knee pain    questionable meniscal tear   Seasonal allergies     Past Surgical History:  Procedure Laterality Date   COLONOSCOPY     Leg Laceration Repair  Left    Age 71   PELVIC LYMPH NODE DISSECTION Bilateral 03/22/2018   Procedure: PELVIC LYMPH NODE DISSECTION;  Surgeon: Ceasar Mons, MD;  Location: [facility];  Service: Urology;  Laterality: Bilateral;   PROSTATE BIOPSY     ROBOT ASSISTED LAPAROSCOPIC RADICAL PROSTATECTOMY N/A 03/22/2018   Procedure: XI ROBOTIC ASSISTED LAPAROSCOPIC PROSTATECTOMY;  Surgeon: Ceasar Mons, MD;  [facility]: [facility];  Service: Urology;  Laterality: N/A;   TOTAL KNEE ARTHROPLASTY Right 05/05/2019   Procedure: RIGHT TOTAL KNEE ARTHROPLASTY;  Surgeon: Frederik Pear, MD;  [facility];  Service: Orthopedics;  Laterality: Right;    There were no vitals filed for this visit.   Subjective Assessment - 06/28/21 1522     Subjective  Pt reports ongoing difficulty with posture at table during self-feeding.  Pt reports that  he sits too close to his plate as he is worried about spilling food.    Patient is accompanied by: Family member    Pertinent History anxiety, arthritis, GERD, HTN, R TKR    Patient Stated Goals to be able to function better    Currently in Pain? No/denies    Pain Onset More than a month ago             ADL: donning/doffing jacket for time.  Pt did not wear vest/jacket this session, therefore completed with hospital gown. Pt demonstrating increased difficulty with gown, requiring 1:28 to don/doff jacket, despite continued attempts at large amplitude movements.  Will attempt to time jacket again during next session.    ADL: Pt demonstrating improved sit > stand and large amplitude movements when scooting chair in/out of table to improve positioning at table.  Pt reports getting in a hurry at home and not using technique.  Encouraged continued use of large amplitude movements as instructed during mobility and self-care tasks as they are more efficient and should result in less pain and frustration when completed correctly.  Large amplitude reaching with RUE, reaching in to cabinet for cones and placing on table to R.  Mod demonstration and multimodal cues for technique to open hand big and obtain full grasp on cones.  Pt demonstrating improved trunk rotation  and looking at hand when placing items on target.  Pt continues to demonstrate mild tone in RUE at shoulder and elbow limiting reach upon testing, but pt able to complete during functional tasks.                       OT Short Term Goals - 06/28/21 1322       OT SHORT TERM GOAL #1   Title Pt will be Independent with PD specific HEP    Time 4    Period Weeks    Status Achieved    Target Date 07/01/21      OT SHORT TERM GOAL #2   Title Pt will verbalize understanding of adapted strategies to maximize safety and I with ADLs/ IADLs .    Time 4    Period Weeks    Status Achieved      OT SHORT TERM GOAL #3   Title  Pt will retrieve a lightweight object from overhead shelf at 130 shoulder flexion and -5 elbow extension with left and right UE's    Baseline R: shoulder 124 and elbow -16  L: shoulder 124 and elbow - 8    Time 4    Period Weeks    Status Achieved      OT SHORT TERM GOAL #4   Title Pt will demonstrate increased ease with dressing as evidenced by decreasing PPT#4(don/ doff jacket) by 30 seconds    Baseline > 2 mins.  06/15/21 1:36.37    Time 4    Period Weeks    Status On-going               OT Long Term Goals - 06/15/21 1056       OT LONG TERM GOAL #1   Title Pt will verbalize understanding of ways to prevent future PD related complications and PD community resources following review    Time 8    Period Weeks    Status On-going    Target Date 07/29/21      OT LONG TERM GOAL #2   Title Pt will demonstrate improved fine motor coordination for ADLs as evidenced by decreasing 9 hole peg test score for RUE by 3 secs    Baseline R: 46.28 and L: 46.75    Time 8    Period Weeks    Status On-going      OT LONG TERM GOAL #3   Title Pt will demonstrate improved ease with dressing as eveidenced by decreasing 3 button/ unbutton time to 2 min or less    Baseline 4:18.1    Time 8    Period Weeks    Status On-going      OT LONG TERM GOAL #4   Title Pt will report pain in right shoulder no greater than 2/10 for ADLs    Time 8    Period Weeks    Status On-going      OT LONG TERM GOAL #5   Title Pt will demonstrate improved ease with feeding as evidenced by decreasing PPT#2 (self feeding) by 3 secs    Baseline 20.81    Time 8    Period Weeks    Status On-going                   Plan - 06/28/21 1321     Clinical Impression Statement Pt demonstrating much improved mobility with scooting chair in and out from table demonstrating increased upright posture and larger  amplitude scoots of chair.  Pt receptive to education of improved posture and use of shoulder rolls to  improve shoulder ROM and posture when reaching and when sitting at table.  Pt continues to benefit from cues for large amplitude movements especially when reaching overhead and holding cups/cones in hand.    OT Occupational Profile and History Detailed Assessment- Review of Records and additional review of physical, cognitive, psychosocial history related to current functional performance    Occupational performance deficits (Please refer to evaluation for details): ADL's;IADL's;Leisure    Body Structure / Function / Physical Skills ADL;Balance;Body mechanics;Coordination;Endurance;Flexibility;FMC;GMC;IADL;Mobility;Pain;ROM;Sensation;Strength;Tone;UE functional use    Rehab Potential Fair    Clinical Decision Making Limited treatment options, no task modification necessary    Comorbidities Affecting Occupational Performance: May have comorbidities impacting occupational performance    Modification or Assistance to Complete Evaluation  No modification of tasks or assist necessary to complete eval    OT Frequency 2x / week    OT Duration 8 weeks    OT Treatment/Interventions Self-care/ADL training;Cryotherapy;Electrical Stimulation;Moist Heat;Ultrasound;Iontophoresis;Therapeutic exercise;Neuromuscular education;Energy conservation;DME and/or AE instruction;Functional Mobility Training;Manual Therapy;Passive range of motion;Therapeutic activities;Cognitive remediation/compensation;Patient/family education;Balance training;Psychosocial skills training    Plan jacket, shoulder pain/stretches and HEP, upright sitting and standing posture    Consulted and Agree with Plan of Care Patient             Patient will benefit from skilled therapeutic intervention in order to improve the following deficits and impairments:   Body Structure / Function / Physical Skills: ADL, Balance, Body mechanics, Coordination, Endurance, Flexibility, FMC, GMC, IADL, Mobility, Pain, ROM, Sensation, Strength, Tone, UE  functional use       Visit Diagnosis: Muscle weakness (generalized)  Other lack of coordination  Unsteadiness on feet  Other symptoms and signs involving the nervous system  Abnormal posture    Problem List Patient Active Problem List   Diagnosis Date Noted   S/P TKR (total knee replacement), right 05/05/2019   Osteoarthritis of right knee 05/02/2019   Prostate cancer ([place]) 03/22/2018   Malignant neoplasm of prostate ([place]) 02/27/2018    Simonne Come, OT 06/28/2021, 3:27 PM  [facility] [address], [place], [place], [address] Phone: [phone]   Fax:  [phone]  Name: Demarlo Riojas MRN: [id number] Date of Birth: [birth year]

## 2021-06-29 ENCOUNTER — Ambulatory Visit: Payer: PPO | Admitting: Physical Therapy

## 2021-07-04 ENCOUNTER — Ambulatory Visit: Payer: PPO | Admitting: Occupational Therapy

## 2021-07-04 ENCOUNTER — Encounter: Payer: Self-pay | Admitting: Occupational Therapy

## 2021-07-04 ENCOUNTER — Encounter: Payer: Self-pay | Admitting: Physical Therapy

## 2021-07-04 ENCOUNTER — Ambulatory Visit: Payer: PPO

## 2021-07-04 ENCOUNTER — Other Ambulatory Visit: Payer: Self-pay

## 2021-07-04 ENCOUNTER — Ambulatory Visit: Payer: PPO | Admitting: Physical Therapy

## 2021-07-04 DIAGNOSIS — R2689 Other abnormalities of gait and mobility: Secondary | ICD-10-CM

## 2021-07-04 DIAGNOSIS — R29818 Other symptoms and signs involving the nervous system: Secondary | ICD-10-CM

## 2021-07-04 DIAGNOSIS — R278 Other lack of coordination: Secondary | ICD-10-CM

## 2021-07-04 DIAGNOSIS — R2681 Unsteadiness on feet: Secondary | ICD-10-CM

## 2021-07-04 DIAGNOSIS — R471 Dysarthria and anarthria: Secondary | ICD-10-CM

## 2021-07-04 DIAGNOSIS — R293 Abnormal posture: Secondary | ICD-10-CM

## 2021-07-04 DIAGNOSIS — R4701 Aphasia: Secondary | ICD-10-CM

## 2021-07-04 DIAGNOSIS — M6281 Muscle weakness (generalized): Secondary | ICD-10-CM | POA: Diagnosis not present

## 2021-07-04 DIAGNOSIS — R1310 Dysphagia, unspecified: Secondary | ICD-10-CM

## 2021-07-04 DIAGNOSIS — R41841 Cognitive communication deficit: Secondary | ICD-10-CM

## 2021-07-04 NOTE — Patient Instructions (Signed)
Access Code: [email or web address] URL: [email or web address] Date: 07/04/2021 Prepared by: [facility]  Exercises Sit to Stand - 1 x daily - 5 x weekly - 2 sets - 5 reps Seated Hamstring Stretch - 1-2 x daily - 7 x weekly - 1 sets - 3 reps - 30 sec hold Standing Quarter Turn with Counter Support - 1 x daily - 5 x weekly - 1-2 sets - 10 reps Staggered Stance Forward Backward Weight Shift with Counter Support - 1 x daily - 7 x weekly - 1-2 sets - 10 reps

## 2021-07-04 NOTE — Therapy (Signed)
[facility] [name], [place], [place], [address] Phone: [phone]   Fax:  [phone]  Occupational Therapy Treatment  Patient Details  Name: Matthew [name] MRN: [id number] Date of Birth: [birth year] Referring Provider (OT): Tat, Eustace Quail, DO   Encounter Date: 07/04/2021   OT End of Session - 07/04/21 1456     Visit Number 7    Number of Visits 17    Date for OT Re-Evaluation 07/29/21    Authorization Type [employer]    OT Start Time 1452    OT Stop Time 1532    OT Time Calculation (min) 40 min    Activity Tolerance Patient tolerated treatment well    Behavior During Therapy [facility] for tasks assessed/performed;Flat affect             Past Medical History:  Diagnosis Date   Anxiety    Arthritis    GERD (gastroesophageal reflux disease)    occ remote history   Hypertension    Parkinson disease ([place])    Prostate cancer ([place])    Right knee pain    questionable meniscal tear   Seasonal allergies     Past Surgical History:  Procedure Laterality Date   COLONOSCOPY     Leg Laceration Repair  Left    Age 71   PELVIC LYMPH NODE DISSECTION Bilateral 03/22/2018   Procedure: PELVIC LYMPH NODE DISSECTION;  Surgeon: Ceasar Mons, MD;  [facility];  Service: Urology;  Laterality: Bilateral;   PROSTATE BIOPSY     ROBOT ASSISTED LAPAROSCOPIC RADICAL PROSTATECTOMY N/A 03/22/2018   Procedure: XI ROBOTIC ASSISTED LAPAROSCOPIC PROSTATECTOMY;  Surgeon: Ceasar Mons, MD;  [facility];  Service: Urology;  Laterality: N/A;   TOTAL KNEE ARTHROPLASTY Right 05/05/2019   Procedure: RIGHT TOTAL KNEE ARTHROPLASTY;  Surgeon: Frederik Pear, MD;  [facility];  Service: Orthopedics;  Laterality: Right;    There were no vitals filed for this visit.   Subjective Assessment - 07/04/21 1459     Subjective  Pt reports increased unsteadiness last couple of days.    Patient is accompanied by:  Family member    Pertinent History anxiety, arthritis, GERD, HTN, R TKR    Patient Stated Goals to be able to function better    Pain Onset More than a month ago              Educated on sleep impacting mobility and motivation to complete exercises and move in large, intentional movements.  Discussed typical schedule with levodopa medication to maximize effectiveness.  Pt reports getting frustrated with handwriting, currently filling out a form for an upcoming doctors appointment at home and writing on CD for friend.  Educated on use of big flick hand movements prior to writing.  Discussed writing fewer words at a time, stopping when he notices writing to get smaller and then starting again.  Discussed cursive vs print with improved legibility and size of writing.  Pt completed trials of various types of writing.  Discussed use of larger print lined paper for improved spacing to increase letter size.  Pt expressing desire to return to playing guitar.  Utilized [employer]if[employer] (light resistance) with focus on finger isolation and combinations to simulate different chords when playing guitar.  Completed bilaterally for strengthening and coordination.  Pt reports noting decreased strength and endurance against resistance.  OT Short Term Goals - 06/28/21 1322       OT SHORT TERM GOAL #1   Title Pt will be Independent with PD specific HEP    Time 4    Period Weeks    Status Achieved    Target Date 07/01/21      OT SHORT TERM GOAL #2   Title Pt will verbalize understanding of adapted strategies to maximize safety and I with ADLs/ IADLs .    Time 4    Period Weeks    Status Achieved      OT SHORT TERM GOAL #3   Title Pt will retrieve a lightweight object from overhead shelf at 130 shoulder flexion and -5 elbow extension with left and right UE's    Baseline R: shoulder 124 and elbow -16  L: shoulder 124 and elbow - 8    Time 4    Period Weeks    Status  Achieved      OT SHORT TERM GOAL #4   Title Pt will demonstrate increased ease with dressing as evidenced by decreasing PPT#4(don/ doff jacket) by 30 seconds    Baseline > 2 mins.  06/15/21 1:36.37    Time 4    Period Weeks    Status On-going               OT Long Term Goals - 06/15/21 1056       OT LONG TERM GOAL #1   Title Pt will verbalize understanding of ways to prevent future PD related complications and PD community resources following review    Time 8    Period Weeks    Status On-going    Target Date 07/29/21      OT LONG TERM GOAL #2   Title Pt will demonstrate improved fine motor coordination for ADLs as evidenced by decreasing 9 hole peg test score for RUE by 3 secs    Baseline R: 46.28 and L: 46.75    Time 8    Period Weeks    Status On-going      OT LONG TERM GOAL #3   Title Pt will demonstrate improved ease with dressing as eveidenced by decreasing 3 button/ unbutton time to 2 min or less    Baseline 4:18.1    Time 8    Period Weeks    Status On-going      OT LONG TERM GOAL #4   Title Pt will report pain in right shoulder no greater than 2/10 for ADLs    Time 8    Period Weeks    Status On-going      OT LONG TERM GOAL #5   Title Pt will demonstrate improved ease with feeding as evidenced by decreasing PPT#2 (self feeding) by 3 secs    Baseline 20.81    Time 8    Period Weeks    Status On-going                   Plan - 07/04/21 1457     Clinical Impression Statement Pt reports feeling more unsteady and frustrated with handwriting recently.  Pt receptive to variety of modifications and blocked practice with handwriting with use of lined vs unlined paper, cursive vs print, and discussing use of larger lined paper to increase focus on big movements with handwriting.  Pt    OT Occupational Profile and History Detailed Assessment- Review of Records and additional review of physical, cognitive, psychosocial history related to current functional  performance    Occupational performance deficits (Please refer to evaluation for details): ADL's;IADL's;Leisure    Body Structure / Function / Physical Skills ADL;Balance;Body mechanics;Coordination;Endurance;Flexibility;FMC;GMC;IADL;Mobility;Pain;ROM;Sensation;Strength;Tone;UE functional use    Rehab Potential Fair    Clinical Decision Making Limited treatment options, no task modification necessary    Comorbidities Affecting Occupational Performance: May have comorbidities impacting occupational performance    Modification or Assistance to Complete Evaluation  No modification of tasks or assist necessary to complete eval    OT Frequency 2x / week    OT Duration 8 weeks    OT Treatment/Interventions Self-care/ADL training;Cryotherapy;Electrical Stimulation;Moist Heat;Ultrasound;Iontophoresis;Therapeutic exercise;Neuromuscular education;Energy conservation;DME and/or AE instruction;Functional Mobility Training;Manual Therapy;Passive range of motion;Therapeutic activities;Cognitive remediation/compensation;Patient/family education;Balance training;Psychosocial skills training    Plan jacket, educate on figure 4 position for LB dsg, trunk rotation and dynamic sitting/standing    Consulted and Agree with Plan of Care Patient             Patient will benefit from skilled therapeutic intervention in order to improve the following deficits and impairments:   Body Structure / Function / Physical Skills: ADL, Balance, Body mechanics, Coordination, Endurance, Flexibility, FMC, GMC, IADL, Mobility, Pain, ROM, Sensation, Strength, Tone, UE functional use       Visit Diagnosis: Muscle weakness (generalized)  Other lack of coordination  Unsteadiness on feet  Other symptoms and signs involving the nervous system  Abnormal posture  Other abnormalities of gait and mobility    Problem List Patient Active Problem List   Diagnosis Date Noted   S/P TKR (total knee replacement), right  05/05/2019   Osteoarthritis of right knee 05/02/2019   Prostate cancer ([place]) 03/22/2018   Malignant neoplasm of prostate ([place]) 02/27/2018    Simonne Come, OT 07/04/2021, 4:41 PM  [facility] [address], [place], [place], [address] Phone: [phone]   Fax:  [phone]  Name: Itay Mella MRN: [id number] Date of Birth: 1951-04-16

## 2021-07-04 NOTE — Therapy (Signed)
[facility] [address] [place], [place], [address] Phone: [phone]   Fax:  [phone]  Speech Language Pathology Treatment  Patient Details  Name: Matthew [name] MRN: [id number] Date of Birth: 1950/06/21 Referring Provider (SLP): Alonza Bogus, DO   Encounter Date: 07/04/2021   End of Session - 07/04/21 1705     Visit Number 5    Number of Visits 17    Date for SLP Re-Evaluation 08/05/21    SLP Start Time 0092    SLP Stop Time  3300    SLP Time Calculation (min) 42 min    Activity Tolerance Patient tolerated treatment well             Past Medical History:  Diagnosis Date   Anxiety    Arthritis    GERD (gastroesophageal reflux disease)    occ remote history   Hypertension    Parkinson disease ([place])    Prostate cancer ([place])    Right knee pain    questionable meniscal tear   Seasonal allergies     Past Surgical History:  Procedure Laterality Date   COLONOSCOPY     Leg Laceration Repair  Left    Age 6   PELVIC LYMPH NODE DISSECTION Bilateral 03/22/2018   Procedure: PELVIC LYMPH NODE DISSECTION;  Surgeon: Ceasar Mons, MD;  [facility];  Service: Urology;  Laterality: Bilateral;   PROSTATE BIOPSY     ROBOT ASSISTED LAPAROSCOPIC RADICAL PROSTATECTOMY N/A 03/22/2018   Procedure: XI ROBOTIC ASSISTED LAPAROSCOPIC PROSTATECTOMY;  Surgeon: Ceasar Mons, MD;  [facility];  Service: Urology;  Laterality: N/A;   TOTAL KNEE ARTHROPLASTY Right 05/05/2019   Procedure: RIGHT TOTAL KNEE ARTHROPLASTY;  Surgeon: Frederik Pear, MD;  [facility];  Service: Orthopedics;  Laterality: Right;    There were no vitals filed for this visit.   Subjective Assessment - 07/04/21 1411     Subjective "Have a good afternoon." (loud, leaving PT and going into ST)    Currently in Pain? No/denies                   ADULT SLP TREATMENT - 07/04/21 1412       General Information    Behavior/Cognition Alert;Cooperative;Pleasant mood      Treatment Provided   Treatment provided Cognitive-Linquistic      Cognitive-Linquistic Treatment   Treatment focused on Dysarthria    Skilled Treatment SLP engaged pt in mod complex conversation for 25 minutes re: many topics pt has interest- pt maintained WNL loudness 90% of the time, but in last 10 minutes pt had more frequent decreases in loudness into upper 60s dB with short spurts of incr'd volume every 2 minutes into lower 70s dB for 10-20 seconds. Loud /a/ averaged low 90s dB and everyday sentences averaged in upper 80s dB.      Assessment / Recommendations / Plan   Plan Continue with current plan of care      Progression Toward Goals   Progression toward goals Progressing toward goals                SLP Short Term Goals - 07/04/21 1706       SLP SHORT TERM GOAL #1   Title pt will produce /a/ with average upper 80s dB at 30 cm over 3 sessions    Baseline 06-15-21. 06-22-21    Status Partially Met    Target Date 07/01/21      SLP  SHORT TERM GOAL #2   Title pt will demo abdominal breathing with sentence responses 85% accuracy x 3 sessions    Status Achieved    Target Date 07/01/21      SLP SHORT TERM GOAL #3   Title pt will generate 5 minutes simple conversation with low 70s dB average in 3 sessions    Baseline 06-28-21    Status Partially Met    Target Date 07/01/21      SLP SHORT TERM GOAL #4   Title pt will undergo insturmental swallow assessment (MBSS or FEES) if clinically indicated    Time 4    Period Weeks    Status Deferred    Target Date 07/01/21              SLP Long Term Goals - 07/04/21 1706       SLP LONG TERM GOAL #1   Title pt will use abdominal breathing >80% of the time during 10 minute simple conversation in 3 sessions    Time 8    Period Weeks    Status On-going    Target Date 08/05/21      SLP LONG TERM GOAL #2   Title pt will maintain upper 60s dB average in 10 minute simple  conversation x 3 sessions    Baseline 06-28-21, 07-04-21    Time 8    Period Weeks    Status On-going    Target Date 08/05/21      SLP LONG TERM GOAL #3   Title pt will report fewer requests to repeat himself than prior to ST in 3 sessions    Time 8    Period Weeks    Status On-going    Target Date 08/05/21      SLP LONG TERM GOAL #4   Title pt will produce average upper 60s dB in 10 minutes simple to mod complex conversation in 5 sessions    Baseline 06-28-21, 07-04-21    Time 8    Period Weeks    Status Revised    Target Date 08/05/21      SLP LONG TERM GOAL #5   Title pt will score better on a speech QOL questionairre than in the first 1-2 ST sessions    Time 8    Period Weeks    Status On-going    Target Date 08/05/21              Plan - 07/04/21 1705     Clinical Impression Statement Matthew [name] [facility]") presents today with 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 Today pt was able to speak for 20-25 mintues with WNL volume until last ~10 minutes. SLP believes pt will cont to benefit from skilled ST targeting speech loudness and clarity. Additionally, pt noted to clear throat consistently with thin liquids. Pt may require an instrumental swallow evaluation during this therapy course and DO signature on this plan of care will be understood to mean that referral for MBSS or FEES is provided.    Speech Therapy Frequency 2x / week    Duration 8 weeks    Treatment/Interventions Aspiration precaution training;Language facilitation;Environmental controls;Cueing hierarchy;SLP instruction and feedback;Compensatory techniques;Functional tasks;Compensatory strategies;Patient/family education;Diet toleration management by SLP;Other (comment)    Potential to Achieve Goals Good    SLP Home Exercise Plan provided    Consulted and Agree with Plan of Care Patient  Patient will benefit from skilled  therapeutic intervention in order to improve the following deficits and impairments:   Dysarthria and anarthria  Dysphagia, unspecified type  Aphasia  Cognitive communication deficit    Problem List Patient Active Problem List   Diagnosis Date Noted   S/P TKR (total knee replacement), right 05/05/2019   Osteoarthritis of right knee 05/02/2019   Prostate cancer ([place]) 03/22/2018   Malignant neoplasm of prostate ([place]) 02/27/2018    [place], [place] 07/04/2021, 5:08 PM  [facility] [address] [place], [place], [address] Phone: [phone]   Fax:  [phone]   Name: Cedrick Partain MRN: [id number] Date of Birth: [birth year]

## 2021-07-05 NOTE — Therapy (Addendum)
[facility] [address], [place], [place], [address] Phone: [phone]   Fax:  [phone]  Physical Therapy Treatment  Patient Details  Name: Matthew [name] MRN: [id number] Date of Birth: 07-16-1950 Referring Provider (PT): Tat, Wells Guiles   Encounter Date: 07/04/2021   PT End of Session - 07/04/21 1322     Visit Number 6    Number of Visits 17    Date for PT Re-Evaluation 07/29/21    Authorization Type [employer]    Progress Note Due on Visit 10    PT Start Time 1322    PT Stop Time 1402    PT Time Calculation (min) 40 min    Activity Tolerance Patient tolerated treatment well    Behavior During Therapy [facility] for tasks assessed/performed;Flat affect             Past Medical History:  Diagnosis Date   Anxiety    Arthritis    GERD (gastroesophageal reflux disease)    occ remote history   Hypertension    Parkinson disease ([place])    Prostate cancer ([place])    Right knee pain    questionable meniscal tear   Seasonal allergies     Past Surgical History:  Procedure Laterality Date   COLONOSCOPY     Leg Laceration Repair  Left    Age 71   PELVIC LYMPH NODE DISSECTION Bilateral 03/22/2018   Procedure: PELVIC LYMPH NODE DISSECTION;  Surgeon: Ceasar Mons, MD;  Location: [facility];  Service: Urology;  Laterality: Bilateral;   PROSTATE BIOPSY     ROBOT ASSISTED LAPAROSCOPIC RADICAL PROSTATECTOMY N/A 03/22/2018   Procedure: XI ROBOTIC ASSISTED LAPAROSCOPIC PROSTATECTOMY;  Surgeon: Ceasar Mons, MD;  Location: [facility];  Service: Urology;  Laterality: N/A;   TOTAL KNEE ARTHROPLASTY Right 05/05/2019   Procedure: RIGHT TOTAL KNEE ARTHROPLASTY;  Surgeon: Frederik Pear, MD;  Location: [facility];  Service: Orthopedics;  Laterality: Right;    There were no vitals filed for this visit.   Subjective Assessment - 07/04/21 1325     Subjective Been feeling a little more unsteady over the past few days-upon  first standing and with walking.    Pertinent History Shoulder pain and back pain (?arthritis); saw orthopedist to f/u wtih R knee (pain from TKR) and mild tendonitis    Patient Stated Goals Pt's goals for therapy are to move normally and move better.  Wife-not fall.    Currently in Pain? No/denies    Pain Onset --                               [facility] Adult PT Treatment/Exercise - 07/04/21 1320       Transfers   Transfers Sit to Stand;Stand to Sit    Sit to Stand 5: Supervision;Without upper extremity assist;From chair/3-in-1    Five time sit to stand comments  11.28    Stand to Sit 5: Supervision;Without upper extremity assist;To chair/3-in-1    Number of Reps 1 set;Other reps (comment)   5 reps   Transfer Cueing Cues for wider BOS      Ambulation/Gait   Ambulation/Gait Yes    Ambulation/Gait Assistance 5: Supervision    Ambulation Distance (Feet) 40 Feet   x 6   Assistive device None    Gait Pattern Decreased step length - right;Decreased step length - left;Decreased arm swing - left;Decreased arm swing - right;Step-through pattern;Right flexed  knee in stance;Left flexed knee in stance;Shuffle;Trunk flexed;Narrow base of support    Ambulation Surface Level;Indoor    Gait Comments Short distance gait focused on wide BOS using visual cues of tile for wider BOS.      Standardized Balance Assessment   Standardized Balance Assessment Timed Up and Go Test      Timed Up and Go Test   TUG Normal TUG    Normal TUG (seconds) 15.81   13.63, 15.15 sec = 14.86 sec     Knee/Hip Exercises: Aerobic   Nustep NuStep, Level 4, 4 extremities x 6 minutes, cues to keep steps/min>80             Sidestepping in parallel bars, x 2 reps R and L-cues for taking as few steps as possible. SLS with foot propped on Airex:  alt UE lifts x 5 reps, each leg as SLS.    Balance Exercises - 07/04/21 1320       Balance Exercises: Standing   Other Standing Exercises Wide BOS  anterior/posterior weightshifting x 10 reps, UE support, then rock and lift foot/reach opposite hand with 1 UE support.    Other Standing Exercises Comments Stagger stance forward/back rocking, x 10 reps, focus on hinge at hips and dynamic ankle dorsiflexion, BUE support at outside of parallel bars.            Performed posterior push and release test x 3 reps:  1st rep-pt takes 2 steps, 2nd and 3rd rep-pt takes 2-3 steps to recover.    Reviewed HEP, with pt return demo understanding: Exercises Sit to Stand - 1 x daily - 5 x weekly - 2 sets - 5 reps Seated Hamstring Stretch - 1-2 x daily - 7 x weekly - 1 sets - 3 reps - 30 sec hold Standing Quarter Turn with Counter Support - 1 x daily - 5 x weekly - 1-2 sets - 10 reps     PT Education - 07/05/21 0821     Education Details Progress towards goals, POC; addition/update to HEP    Person(s) Educated Patient    Methods Explanation;Demonstration;Handout    Comprehension Verbalized understanding;Returned demonstration              PT Short Term Goals - 07/04/21 1337       PT SHORT TERM GOAL #1   Title Pt will be indepedent with Parkinson's specific HEP to address balance, transfers, gait for improved overall mobility.  TARGET 07/01/2021    Time 4    Period Weeks    Status Partially Met      PT SHORT TERM GOAL #2   Title Pt will improve 5x sit<>stand to less than or equal to 10 sec to demonstrate improved functional strength and transfer efficiency.    Baseline 11.66 sec    Time 4    Period Weeks    Status Partially Met      PT SHORT TERM GOAL #3   Title Pt will improve TUG score to less than or equal to 13.5 sec for decreased fall risk.    Baseline 16.19 sec    Time 4    Period Weeks    Status Partially Met      PT SHORT TERM GOAL #4   Title Pt will perform push and release test in posterior direction in 2 steps or less for improved balance recovery.    Baseline 4 steps, would fall if unaided; 2-3 steps in posterior  direction and able to recover  on his own    Time 4    Period Weeks    Status Partially Met               PT Long Term Goals - 06/20/21 1732       PT LONG TERM GOAL #1   Title Pt will be independent with progression of HEP for Parkinson's related deficits.  TARGET 07/29/2021    Time 8    Period Weeks    Status On-going      PT LONG TERM GOAL #2   Title Pt will perform at least 8 of 10 reps of sit<>stand from <18" surfaces, for improved transfer efficiency and safety.    Time 8    Period Weeks    Status On-going      PT LONG TERM GOAL #3   Title Pt will improve MiniBESTest score to at least 21/28 for decreased fall risk.    Baseline 14/28    Time 8    Period Weeks    Status On-going      PT LONG TERM GOAL #4   Title Pt will improve TUG cognitive score to less than or equal to 15 sec for improved dual tasking with gait.    Baseline 18.44 sec    Time 8    Period Weeks    Status On-going      PT LONG TERM GOAL #5   Title Pt will verbalize plans for continued community fitness upon d/c from PT.    Time 8    Period Weeks    Status On-going                   Plan - 07/05/21 0630     Clinical Impression Statement Assessed STGs and pt is progressing to but not fully met all STGs.  He has improved 5x sit<>stand, TUG score and posterior push and release, just not to goal level.  He has had several weeks where he has not met frequency of appointments due to scheduling conflicts.  He is progressing slowly towards goals and he is demonstrating improved functional mobility, still remains at fall risk due to Parkinson's deficits.  He will continue to benefit from skilled PT towards LTGs for improved overall functional mobility and decreased fall risk.    PT Frequency 2x / week    PT Duration 8 weeks    PT Treatment/Interventions ADLs/Self Care Home Management;Gait training;Stair training;Functional mobility training;Therapeutic activities;Therapeutic exercise;Balance  training;Neuromuscular re-education;DME Instruction;Patient/family education;Manual techniques;Passive range of motion    PT Next Visit Plan Check HEP additions, continue to work on balance recovery, large amplitude movement patterns, varied surfaces and gait training.  Ask about new Friday PWR! Moves ex class    Consulted and Agree with Plan of Care Patient             Patient will benefit from skilled therapeutic intervention in order to improve the following deficits and impairments:  Abnormal gait, Difficulty walking, Decreased balance, Impaired flexibility, Postural dysfunction, Decreased strength, Decreased mobility  Visit Diagnosis: Unsteadiness on feet  Abnormal posture  Other abnormalities of gait and mobility     Problem List Patient Active Problem List   Diagnosis Date Noted   S/P TKR (total knee replacement), right 05/05/2019   Osteoarthritis of right knee 05/02/2019   Prostate cancer ([place]) 03/22/2018   Malignant neoplasm of prostate ([place]) 02/27/2018    Liev Brockbank W., PT 07/05/2021, 8:28 AM  [facility] [address]  [address] [place], [place], [address] Phone: [phone]   Fax:  [phone]  Name: Matthew [name] MRN: [id number] Date of Birth: [birth year]

## 2021-07-06 ENCOUNTER — Ambulatory Visit: Payer: PPO | Admitting: Physical Therapy

## 2021-07-06 ENCOUNTER — Encounter: Payer: PPO | Admitting: Occupational Therapy

## 2021-07-07 ENCOUNTER — Ambulatory Visit: Payer: PPO | Admitting: Occupational Therapy

## 2021-07-07 ENCOUNTER — Other Ambulatory Visit: Payer: Self-pay

## 2021-07-07 ENCOUNTER — Encounter: Payer: Self-pay | Admitting: Occupational Therapy

## 2021-07-07 DIAGNOSIS — R29818 Other symptoms and signs involving the nervous system: Secondary | ICD-10-CM

## 2021-07-07 DIAGNOSIS — R293 Abnormal posture: Secondary | ICD-10-CM

## 2021-07-07 DIAGNOSIS — M6281 Muscle weakness (generalized): Secondary | ICD-10-CM

## 2021-07-07 DIAGNOSIS — R2681 Unsteadiness on feet: Secondary | ICD-10-CM

## 2021-07-07 DIAGNOSIS — R278 Other lack of coordination: Secondary | ICD-10-CM

## 2021-07-07 NOTE — Therapy (Signed)
[facility] [address], [place], [place], [address] Phone: [phone]   Fax:  [phone]  Occupational Therapy Treatment  Patient Details  Name: Matthew [name] MRN: [id number] Date of Birth: [birth year] Referring Provider (OT): Tat, Eustace Quail, DO   Encounter Date: 07/07/2021   OT End of Session - 07/07/21 1026     Visit Number 8    Number of Visits 17    Date for OT Re-Evaluation 07/29/21    Authorization Type [employer]    OT Start Time 1020    OT Stop Time 1103    OT Time Calculation (min) 43 min    Activity Tolerance Patient tolerated treatment well    Behavior During Therapy [facility] for tasks assessed/performed;Flat affect             Past Medical History:  Diagnosis Date   Anxiety    Arthritis    GERD (gastroesophageal reflux disease)    occ remote history   Hypertension    Parkinson disease ([place])    Prostate cancer ([place])    Right knee pain    questionable meniscal tear   Seasonal allergies     Past Surgical History:  Procedure Laterality Date   COLONOSCOPY     Leg Laceration Repair  Left    Age 71   PELVIC LYMPH NODE DISSECTION Bilateral 03/22/2018   Procedure: PELVIC LYMPH NODE DISSECTION;  Surgeon: Ceasar Mons, MD;  [facility];  Service: Urology;  Laterality: Bilateral;   PROSTATE BIOPSY     ROBOT ASSISTED LAPAROSCOPIC RADICAL PROSTATECTOMY N/A 03/22/2018   Procedure: XI ROBOTIC ASSISTED LAPAROSCOPIC PROSTATECTOMY;  Surgeon: Ceasar Mons, MD;  [facility];  Service: Urology;  Laterality: N/A;   TOTAL KNEE ARTHROPLASTY Right 05/05/2019   Procedure: RIGHT TOTAL KNEE ARTHROPLASTY;  Surgeon: Frederik Pear, MD;  [facility];  Service: Orthopedics;  Laterality: Right;    There were no vitals filed for this visit.   Subjective Assessment - 07/07/21 1023     Subjective  Pt reports off schedule from a medication standpoint as he missed an evening dose last  night and can feel the impact today.  Pt reports feeling better this morning after returning to schedule, but still off.  Pt reports increased shakiness and off balance last night due to missed dose.    Patient is accompanied by: Family member    Pertinent History anxiety, arthritis, GERD, HTN, R TKR    Patient Stated Goals to be able to function better    Currently in Pain? No/denies    Pain Onset More than a month ago              ADL: Donning/doffing vest in 30.71 seconds.  Pt demonstrating mild increased shakiness in B hands and reports mild instability.  Noted increased "shimmying" motion when doffing jacket down back,however demonstrating improved trunk rotation and visual attention to each arm when doffing jacket from shoulder with large amplitude movement.    Educated on cutting meat.  Pt typically takes sawing motion to cut food.  Educated on slicing with larger movement with demonstration with putty.  Pt able to return demonstration with mild improvements (difficult to truly assess quality due to putty).  Reiterated big amplitude movements even with opening containers.  Pt able to open container with larger movements, as he recognizes that his movements are smaller with opening items.    Engaged in handwriting on larger lined paper.  Educated on  pen grip, deliberate effort with each letter, printing vs cursive, taking a pause between letters and or works, and avoiding hurrying or writing long passages.  Pt completed handwriting on 1/2" lined paper with focus on larger font with improved legibility.                      OT Short Term Goals - 06/28/21 1322       OT SHORT TERM GOAL #1   Title Pt will be Independent with PD specific HEP    Time 4    Period Weeks    Status Achieved    Target Date 07/01/21      OT SHORT TERM GOAL #2   Title Pt will verbalize understanding of adapted strategies to maximize safety and I with ADLs/ IADLs .    Time 4    Period  Weeks    Status Achieved      OT SHORT TERM GOAL #3   Title Pt will retrieve a lightweight object from overhead shelf at 130 shoulder flexion and -5 elbow extension with left and right UE's    Baseline R: shoulder 124 and elbow -16  L: shoulder 124 and elbow - 8    Time 4    Period Weeks    Status Achieved      OT SHORT TERM GOAL #4   Title Pt will demonstrate increased ease with dressing as evidenced by decreasing PPT#4(don/ doff jacket) by 30 seconds    Baseline > 2 mins.  06/15/21 1:36.37    Time 4    Period Weeks    Status On-going               OT Long Term Goals - 06/15/21 1056       OT LONG TERM GOAL #1   Title Pt will verbalize understanding of ways to prevent future PD related complications and PD community resources following review    Time 8    Period Weeks    Status On-going    Target Date 07/29/21      OT LONG TERM GOAL #2   Title Pt will demonstrate improved fine motor coordination for ADLs as evidenced by decreasing 9 hole peg test score for RUE by 3 secs    Baseline R: 46.28 and L: 46.75    Time 8    Period Weeks    Status On-going      OT LONG TERM GOAL #3   Title Pt will demonstrate improved ease with dressing as eveidenced by decreasing 3 button/ unbutton time to 2 min or less    Baseline 4:18.1    Time 8    Period Weeks    Status On-going      OT LONG TERM GOAL #4   Title Pt will report pain in right shoulder no greater than 2/10 for ADLs    Time 8    Period Weeks    Status On-going      OT LONG TERM GOAL #5   Title Pt will demonstrate improved ease with feeding as evidenced by decreasing PPT#2 (self feeding) by 3 secs    Baseline 20.81    Time 8    Period Weeks    Status On-going                   Plan - 07/07/21 1027     Clinical Impression Statement Pt with increased instability and shakiness this session due to missed dose of  PD medication last night.  Pt receptive to education of slicing vs sawing motion when cutting  food, pt able to return demonstration with use of putty to simulate cutting meat.  Pt demonstrating improved awareness of letter size when utilizing larger spaced (1/2") lined paper.  Pt    OT Occupational Profile and History Detailed Assessment- Review of Records and additional review of physical, cognitive, psychosocial history related to current functional performance    Occupational performance deficits (Please refer to evaluation for details): ADL's;IADL's;Leisure    Body Structure / Function / Physical Skills ADL;Balance;Body mechanics;Coordination;Endurance;Flexibility;FMC;GMC;IADL;Mobility;Pain;ROM;Sensation;Strength;Tone;UE functional use    Rehab Potential Fair    Clinical Decision Making Limited treatment options, no task modification necessary    Comorbidities Affecting Occupational Performance: May have comorbidities impacting occupational performance    Modification or Assistance to Complete Evaluation  No modification of tasks or assist necessary to complete eval    OT Frequency 2x / week    OT Duration 8 weeks    OT Treatment/Interventions Self-care/ADL training;Cryotherapy;Electrical Stimulation;Moist Heat;Ultrasound;Iontophoresis;Therapeutic exercise;Neuromuscular education;Energy conservation;DME and/or AE instruction;Functional Mobility Training;Manual Therapy;Passive range of motion;Therapeutic activities;Cognitive remediation/compensation;Patient/family education;Balance training;Psychosocial skills training    Plan writing, educate on figure 4 position for LB dsg, trunk rotation and dynamic sitting/standing    Consulted and Agree with Plan of Care Patient             Patient will benefit from skilled therapeutic intervention in order to improve the following deficits and impairments:   Body Structure / Function / Physical Skills: ADL, Balance, Body mechanics, Coordination, Endurance, Flexibility, FMC, GMC, IADL, Mobility, Pain, ROM, Sensation, Strength, Tone, UE functional  use       Visit Diagnosis: Muscle weakness (generalized)  Other lack of coordination  Unsteadiness on feet  Abnormal posture  Other symptoms and signs involving the nervous system    Problem List Patient Active Problem List   Diagnosis Date Noted   S/P TKR (total knee replacement), right 05/05/2019   Osteoarthritis of right knee 05/02/2019   Prostate cancer ([place]) 03/22/2018   Malignant neoplasm of prostate ([place]) 02/27/2018    Simonne Come, OT 07/07/2021, 12:53 PM  [facility] [address] [place], [place], [address] Phone: [phone]   Fax:  [phone]  Name: Anant Agard MRN: [id number] Date of Birth: [birth year]

## 2021-07-08 ENCOUNTER — Encounter: Payer: Self-pay | Admitting: Physical Therapy

## 2021-07-08 ENCOUNTER — Ambulatory Visit: Payer: PPO | Admitting: Physical Therapy

## 2021-07-08 DIAGNOSIS — M6281 Muscle weakness (generalized): Secondary | ICD-10-CM

## 2021-07-08 DIAGNOSIS — R2681 Unsteadiness on feet: Secondary | ICD-10-CM

## 2021-07-08 DIAGNOSIS — R293 Abnormal posture: Secondary | ICD-10-CM

## 2021-07-08 NOTE — Therapy (Signed)
[facility] [address] [place], [place], [address] Phone: [phone]   Fax:  [phone]  Physical Therapy Treatment  Patient Details  Name: Matthew [name] MRN: [id number] Date of Birth: 1950/12/31 Referring Provider (PT): Tat, Wells Guiles   Encounter Date: 07/08/2021   PT End of Session - 07/08/21 1113     Visit Number 7    Number of Visits 17    Date for PT Re-Evaluation 07/29/21    Authorization Type [employer]    Progress Note Due on Visit 10    PT Start Time 1103    PT Stop Time 1143    PT Time Calculation (min) 40 min    Activity Tolerance Patient tolerated treatment well    Behavior During Therapy [facility] for tasks assessed/performed;Flat affect             Past Medical History:  Diagnosis Date   Anxiety    Arthritis    GERD (gastroesophageal reflux disease)    occ remote history   Hypertension    Parkinson disease ([place])    Prostate cancer ([place])    Right knee pain    questionable meniscal tear   Seasonal allergies     Past Surgical History:  Procedure Laterality Date   COLONOSCOPY     Leg Laceration Repair  Left    Age 38   PELVIC LYMPH NODE DISSECTION Bilateral 03/22/2018   Procedure: PELVIC LYMPH NODE DISSECTION;  Surgeon: Ceasar Mons, MD;  Location: [facility];  Service: Urology;  Laterality: Bilateral;   PROSTATE BIOPSY     ROBOT ASSISTED LAPAROSCOPIC RADICAL PROSTATECTOMY N/A 03/22/2018   Procedure: XI ROBOTIC ASSISTED LAPAROSCOPIC PROSTATECTOMY;  Surgeon: Ceasar Mons, MD;  Location: [facility];  Service: Urology;  Laterality: N/A;   TOTAL KNEE ARTHROPLASTY Right 05/05/2019   Procedure: RIGHT TOTAL KNEE ARTHROPLASTY;  Surgeon: Frederik Pear, MD;  [facility];  Service: Orthopedics;  Laterality: Right;    There were no vitals filed for this visit.   Subjective Assessment - 07/08/21 1107     Subjective Missed a dose of medication yesterday/last night, so that got me  messed up.  Feel I'm better today.    Pertinent History Shoulder pain and back pain (?arthritis); saw orthopedist to f/u wtih R knee (pain from TKR) and mild tendonitis    Patient Stated Goals Pt's goals for therapy are to move normally and move better.  Wife-not fall.    Currently in Pain? No/denies                               [facility] Adult PT Treatment/Exercise - 07/08/21 0001       Knee/Hip Exercises: Aerobic   Nustep NuStep, Level 4, 4 extremities x 7 minutes, cues to keep steps/min>80      Knee/Hip Exercises: Seated   Sit to Sand 10 reps;without UE support;2 sets   holding 2.2# weighted ball, then 2nd set PWR! Up position open up with weigthed ball                Balance Exercises - 07/08/21 0001       Balance Exercises: Standing   SLS with Vectors Solid surface;Upper extremity assist 1;Limitations    SLS with Vectors Limitations step taps forward>back reach (runner's stretch position), x 10 reps each    Step Ups Forward;4 inch;UE support 2    Sidestepping 3 reps;Limitations  Sidestepping Limitations Sidestepping with added coordinated UE activities, sidestepping with high lift for foot clearance    Other Standing Exercises Forward/back walking in clinic, short distances, forward gait with arm swing and with turns to change direcitons.    Other Standing Exercises Comments REview of HEP addition last visit:  Stagger stance forward/back rocking, x 10 reps, focus on hinge at hips and dynamic ankle dorsiflexion, BUE support at outside of parallel bars.  Pt needs min cues for technique at beginning of exercise.  2nd set performed with 1 UE support and other arm swing forward and back                  PT Short Term Goals - 07/04/21 1337       PT SHORT TERM GOAL #1   Title Pt will be indepedent with Parkinson's specific HEP to address balance, transfers, gait for improved overall mobility.  TARGET 07/01/2021    Time 4    Period Weeks    Status  Partially Met      PT SHORT TERM GOAL #2   Title Pt will improve 5x sit<>stand to less than or equal to 10 sec to demonstrate improved functional strength and transfer efficiency.    Baseline 11.66 sec    Time 4    Period Weeks    Status Partially Met      PT SHORT TERM GOAL #3   Title Pt will improve TUG score to less than or equal to 13.5 sec for decreased fall risk.    Baseline 16.19 sec    Time 4    Period Weeks    Status Partially Met      PT SHORT TERM GOAL #4   Title Pt will perform push and release test in posterior direction in 2 steps or less for improved balance recovery.    Baseline 4 steps, would fall if unaided; 2-3 steps in posterior direction and able to recover on his own    Time 4    Period Weeks    Status Partially Met               PT Long Term Goals - 06/20/21 1732       PT LONG TERM GOAL #1   Title Pt will be independent with progression of HEP for Parkinson's related deficits.  TARGET 07/29/2021    Time 8    Period Weeks    Status On-going      PT LONG TERM GOAL #2   Title Pt will perform at least 8 of 10 reps of sit<>stand from <18" surfaces, for improved transfer efficiency and safety.    Time 8    Period Weeks    Status On-going      PT LONG TERM GOAL #3   Title Pt will improve MiniBESTest score to at least 21/28 for decreased fall risk.    Baseline 14/28    Time 8    Period Weeks    Status On-going      PT LONG TERM GOAL #4   Title Pt will improve TUG cognitive score to less than or equal to 15 sec for improved dual tasking with gait.    Baseline 18.44 sec    Time 8    Period Weeks    Status On-going      PT LONG TERM GOAL #5   Title Pt will verbalize plans for continued community fitness upon d/c from PT.    Time 8  Period Weeks    Status On-going                   Plan - 07/08/21 1113     Clinical Impression Statement Skilled PT session today focused on balance activities, with practice on recovery in  posterior direction.  With aerobic activity and conversation tasks, he tends to decrease steps/min, needing cues for increased intensity of movement.  Pt will continue to benefit from skilled PT towards goals for improved functional mobility and decreased falls.    PT Frequency 2x / week    PT Duration 8 weeks    PT Treatment/Interventions ADLs/Self Care Home Management;Gait training;Stair training;Functional mobility training;Therapeutic activities;Therapeutic exercise;Balance training;Neuromuscular re-education;DME Instruction;Patient/family education;Manual techniques;Passive range of motion    PT Next Visit Plan Check HEP additions, continue to work on balance recovery, large amplitude movement patterns, varied surfaces and gait training.  Ask about new Friday PWR! Moves ex class    Consulted and Agree with Plan of Care Patient             Patient will benefit from skilled therapeutic intervention in order to improve the following deficits and impairments:  Abnormal gait, Difficulty walking, Decreased balance, Impaired flexibility, Postural dysfunction, Decreased strength, Decreased mobility  Visit Diagnosis: Muscle weakness (generalized)  Abnormal posture  Unsteadiness on feet     Problem List Patient Active Problem List   Diagnosis Date Noted   S/P TKR (total knee replacement), right 05/05/2019   Osteoarthritis of right knee 05/02/2019   Prostate cancer ([place]) 03/22/2018   Malignant neoplasm of prostate ([place]) 02/27/2018    Lenna Hagarty W., PT 07/08/2021, 11:44 AM  [facility] [address] [place], [place], [address] Phone: [phone]   Fax:  [phone]  Name: Matthew [name] MRN: [id number] Date of Birth: [birth year]

## 2021-07-11 ENCOUNTER — Encounter: Payer: Self-pay | Admitting: Physical Therapy

## 2021-07-11 ENCOUNTER — Ambulatory Visit: Payer: PPO | Admitting: Physical Therapy

## 2021-07-11 ENCOUNTER — Ambulatory Visit: Payer: PPO | Admitting: Occupational Therapy

## 2021-07-11 ENCOUNTER — Encounter: Payer: Self-pay | Admitting: Occupational Therapy

## 2021-07-11 ENCOUNTER — Other Ambulatory Visit: Payer: Self-pay

## 2021-07-11 ENCOUNTER — Ambulatory Visit: Payer: PPO

## 2021-07-11 DIAGNOSIS — R471 Dysarthria and anarthria: Secondary | ICD-10-CM

## 2021-07-11 DIAGNOSIS — M6281 Muscle weakness (generalized): Secondary | ICD-10-CM

## 2021-07-11 DIAGNOSIS — R41841 Cognitive communication deficit: Secondary | ICD-10-CM

## 2021-07-11 DIAGNOSIS — R278 Other lack of coordination: Secondary | ICD-10-CM

## 2021-07-11 DIAGNOSIS — R2681 Unsteadiness on feet: Secondary | ICD-10-CM

## 2021-07-11 DIAGNOSIS — R293 Abnormal posture: Secondary | ICD-10-CM

## 2021-07-11 DIAGNOSIS — R29818 Other symptoms and signs involving the nervous system: Secondary | ICD-10-CM

## 2021-07-11 DIAGNOSIS — R4701 Aphasia: Secondary | ICD-10-CM

## 2021-07-11 DIAGNOSIS — R1310 Dysphagia, unspecified: Secondary | ICD-10-CM

## 2021-07-11 NOTE — Therapy (Signed)
[facility] [address], [place], [place], [address] Phone: [phone]   Fax:  [phone]  Occupational Therapy Treatment  Patient Details  Name: Matthew [name] MRN: [id number] Date of Birth: 1951-02-20 Referring Provider (OT): Tat, Eustace Quail, DO   Encounter Date: 07/11/2021   OT End of Session - 07/11/21 1323     Visit Number 9    Number of Visits 17    Date for OT Re-Evaluation 07/29/21    Authorization Type [employer]    OT Start Time 1319    OT Stop Time 1402    OT Time Calculation (min) 43 min    Activity Tolerance Patient tolerated treatment well    Behavior During Therapy [facility] for tasks assessed/performed;Flat affect             Past Medical History:  Diagnosis Date   Anxiety    Arthritis    GERD (gastroesophageal reflux disease)    occ remote history   Hypertension    Parkinson disease ([place])    Prostate cancer ([place])    Right knee pain    questionable meniscal tear   Seasonal allergies     Past Surgical History:  Procedure Laterality Date   COLONOSCOPY     Leg Laceration Repair  Left    Age 71   PELVIC LYMPH NODE DISSECTION Bilateral 03/22/2018   Procedure: PELVIC LYMPH NODE DISSECTION;  Surgeon: Ceasar Mons, MD;  [facility];  Service: Urology;  Laterality: Bilateral;   PROSTATE BIOPSY     ROBOT ASSISTED LAPAROSCOPIC RADICAL PROSTATECTOMY N/A 03/22/2018   Procedure: XI ROBOTIC ASSISTED LAPAROSCOPIC PROSTATECTOMY;  Surgeon: Ceasar Mons, MD;  [facility];  Service: Urology;  Laterality: N/A;   TOTAL KNEE ARTHROPLASTY Right 05/05/2019   Procedure: RIGHT TOTAL KNEE ARTHROPLASTY;  Surgeon: Frederik Pear, MD;  [facility];  Service: Orthopedics;  Laterality: Right;    There were no vitals filed for this visit.   Subjective Assessment - 07/11/21 1321     Subjective  Pt reports noticing difficulty when buttoning flannel shirt over the weekend and  continued difficulty donning Depends.    Patient is accompanied by: Family member    Pertinent History anxiety, arthritis, GERD, HTN, R TKR    Patient Stated Goals to be able to function better    Currently in Pain? No/denies    Pain Onset More than a month ago              ADL: Engaged in forward reaching with focus on donning Depends from toilet height.  Pt continues to demonstrate difficulty reaching forward towards foot, but also with difficulty obtaining figure 4 pos ition to don Depends.  Educated on large amplitude movements with forward reach to don pants/Depends with use of bag.  Completed x5 bilaterally with noted improvements R > L.  Issued bag exercises to continue to address LB dressing.  Attempted to complete with spare depends, however pt demonstrating decreased carryover of previous practice with use of depend instead of blocked practice with bag.  Educated on use of reacher for LB dressing.  Will benefit from continued practice with bag exercises and problem solving strategies with and without AE to don pants/Depends.  Pt donned shoe in figure 4 position and tied shoe with increased time.  Pt reports difficulty donning seat belt.  Educated on trunk rotation and turning head to look at seat belt to increase reach.  Educated on big amplitude reach  to grasp seatbelt and pull down with large, consistent movement.  Completed simulated reaching for seat belt with use of gait belt.                        OT Short Term Goals - 06/28/21 1322       OT SHORT TERM GOAL #1   Title Pt will be Independent with PD specific HEP    Time 4    Period Weeks    Status Achieved    Target Date 07/01/21      OT SHORT TERM GOAL #2   Title Pt will verbalize understanding of adapted strategies to maximize safety and I with ADLs/ IADLs .    Time 4    Period Weeks    Status Achieved      OT SHORT TERM GOAL #3   Title Pt will retrieve a lightweight object from overhead shelf  at 130 shoulder flexion and -5 elbow extension with left and right UE's    Baseline R: shoulder 124 and elbow -16  L: shoulder 124 and elbow - 8    Time 4    Period Weeks    Status Achieved      OT SHORT TERM GOAL #4   Title Pt will demonstrate increased ease with dressing as evidenced by decreasing PPT#4(don/ doff jacket) by 30 seconds    Baseline > 2 mins.  06/15/21 1:36.37    Time 4    Period Weeks    Status On-going               OT Long Term Goals - 06/15/21 1056       OT LONG TERM GOAL #1   Title Pt will verbalize understanding of ways to prevent future PD related complications and PD community resources following review    Time 8    Period Weeks    Status On-going    Target Date 07/29/21      OT LONG TERM GOAL #2   Title Pt will demonstrate improved fine motor coordination for ADLs as evidenced by decreasing 9 hole peg test score for RUE by 3 secs    Baseline R: 46.28 and L: 46.75    Time 8    Period Weeks    Status On-going      OT LONG TERM GOAL #3   Title Pt will demonstrate improved ease with dressing as eveidenced by decreasing 3 button/ unbutton time to 2 min or less    Baseline 4:18.1    Time 8    Period Weeks    Status On-going      OT LONG TERM GOAL #4   Title Pt will report pain in right shoulder no greater than 2/10 for ADLs    Time 8    Period Weeks    Status On-going      OT LONG TERM GOAL #5   Title Pt will demonstrate improved ease with feeding as evidenced by decreasing PPT#2 (self feeding) by 3 secs    Baseline 20.81    Time 8    Period Weeks    Status On-going                   Plan - 07/11/21 1323     Clinical Impression Statement Pt demonstrating good carryover of big amplitude movements with repetition to increase success with fastening seat belt and completing LB dressing.  Pt demonstrating increased amplitude with trunk rotation and  head turn, allowing for increased reach when reaching to engage in task to carryover to  fastening seat belt.  Pt continues to be limited by small, repetitive movements with tasks and reports occasional "freezing" and having to begin again during self-care and leisure tasks.    OT Occupational Profile and History Detailed Assessment- Review of Records and additional review of physical, cognitive, psychosocial history related to current functional performance    Occupational performance deficits (Please refer to evaluation for details): ADL's;IADL's;Leisure    Body Structure / Function / Physical Skills ADL;Balance;Body mechanics;Coordination;Endurance;Flexibility;FMC;GMC;IADL;Mobility;Pain;ROM;Sensation;Strength;Tone;UE functional use    Rehab Potential Fair    Clinical Decision Making Limited treatment options, no task modification necessary    Comorbidities Affecting Occupational Performance: May have comorbidities impacting occupational performance    Modification or Assistance to Complete Evaluation  No modification of tasks or assist necessary to complete eval    OT Frequency 2x / week    OT Duration 8 weeks    OT Treatment/Interventions Self-care/ADL training;Cryotherapy;Electrical Stimulation;Moist Heat;Ultrasound;Iontophoresis;Therapeutic exercise;Neuromuscular education;Energy conservation;DME and/or AE instruction;Functional Mobility Training;Manual Therapy;Passive range of motion;Therapeutic activities;Cognitive remediation/compensation;Patient/family education;Balance training;Psychosocial skills training    Plan writing, LB dsg with reaching and figure 4 , trunk rotation in sitting and standing and dynamic sitting/standing    OT Home Exercise Plan bag exercises - pt instructions    Consulted and Agree with Plan of Care Patient             Patient will benefit from skilled therapeutic intervention in order to improve the following deficits and impairments:   Body Structure / Function / Physical Skills: ADL, Balance, Body mechanics, Coordination, Endurance, Flexibility,  FMC, GMC, IADL, Mobility, Pain, ROM, Sensation, Strength, Tone, UE functional use       Visit Diagnosis: Muscle weakness (generalized)  Other lack of coordination  Other symptoms and signs involving the nervous system  Abnormal posture  Unsteadiness on feet    Problem List Patient Active Problem List   Diagnosis Date Noted   S/P TKR (total knee replacement), right 05/05/2019   Osteoarthritis of right knee 05/02/2019   Prostate cancer ([place]) 03/22/2018   Malignant neoplasm of prostate ([place]) 02/27/2018    Simonne Come, OT 07/11/2021, 4:42 PM  [facility] [address], [place], [place], [address] Phone: [phone]   Fax:  [phone]  Name: Matthew [name] MRN: [id number] Date of Birth: 03-09-1951

## 2021-07-11 NOTE — Therapy (Signed)
[facility] [address] [place], [place], [address] Phone: [phone]   Fax:  [phone]  Physical Therapy Treatment  Patient Details  Name: Matthew [name] MRN: [id number] Date of Birth: Dec 06, 1950 Referring Provider (PT): Tat, Wells Guiles   Encounter Date: 07/11/2021   PT End of Session - 07/11/21 1531     Visit Number 8    Number of Visits 17    Date for PT Re-Evaluation 07/29/21    Authorization Type [employer]    Progress Note Due on Visit 10    PT Start Time 1450    PT Stop Time 1529    PT Time Calculation (min) 39 min    Equipment Utilized During Treatment Gait belt    Activity Tolerance Patient tolerated treatment well    Behavior During Therapy [facility] for tasks assessed/performed;Flat affect             Past Medical History:  Diagnosis Date   Anxiety    Arthritis    GERD (gastroesophageal reflux disease)    occ remote history   Hypertension    Parkinson disease ([place])    Prostate cancer ([place])    Right knee pain    questionable meniscal tear   Seasonal allergies     Past Surgical History:  Procedure Laterality Date   COLONOSCOPY     Leg Laceration Repair  Left    Age 17   PELVIC LYMPH NODE DISSECTION Bilateral 03/22/2018   Procedure: PELVIC LYMPH NODE DISSECTION;  Surgeon: Ceasar Mons, MD;  [facility];  Service: Urology;  Laterality: Bilateral;   PROSTATE BIOPSY     ROBOT ASSISTED LAPAROSCOPIC RADICAL PROSTATECTOMY N/A 03/22/2018   Procedure: XI ROBOTIC ASSISTED LAPAROSCOPIC PROSTATECTOMY;  Surgeon: Ceasar Mons, MD;  [facility];  Service: Urology;  Laterality: N/A;   TOTAL KNEE ARTHROPLASTY Right 05/05/2019   Procedure: RIGHT TOTAL KNEE ARTHROPLASTY;  Surgeon: Frederik Pear, MD;  [facility];  Service: Orthopedics;  Laterality: Right;    There were no vitals filed for this visit.   Subjective Assessment - 07/11/21 1449     Subjective Denies recent  falls, but a couple "shudders."    Pertinent History Shoulder pain and back pain (?arthritis); saw orthopedist to f/u wtih R knee (pain from TKR) and mild tendonitis    Patient Stated Goals Pt's goals for therapy are to move normally and move better.  Wife-not fall.    Currently in Pain? No/denies                               [facility] Adult PT Treatment/Exercise - 07/11/21 0001       Ambulation/Gait   Ambulation/Gait Assistance 4: Min guard;5: Supervision    Ambulation Distance (Feet) 250 Feet    Assistive device --   2 walking poles   Gait Pattern Decreased step length - right;Decreased step length - left;Decreased arm swing - left;Decreased arm swing - right;Step-through pattern;Right flexed knee in stance;Left flexed knee in stance;Shuffle;Trunk flexed;Narrow base of support    Ambulation Surface Level;Unlevel;Indoor;Outdoor;Paved;Gravel;Grass    Gait Comments gait training with walking poles and cues for upright posture and AD sequencing; intermittent breaks to stop and reset; some instability uphill      Neuro Re-ed    Neuro Re-ed Details  R/L step up on foam with CGA 10x each; R/L backwards step + arms outstretched 5x each; R/L side step over  cone with arms outstretched 10x   cues to shift to standing LE, maintain upright posture     Knee/Hip Exercises: Stretches   Gastroc Stretch Right;Left;1 rep;30 seconds    Gastroc Stretch Limitations runner's stretch      Knee/Hip Exercises: Aerobic   Nustep NuStep, Level 5, 4 extremities x 7 minutes, cues to keep steps/min>80                     PT Education - 07/11/21 1531     Education Details discussion on safe walking with walking poles at local parks with paved trails    Person(s) Educated Patient    Methods Explanation    Comprehension Verbalized understanding              PT Short Term Goals - 07/04/21 1337       PT SHORT TERM GOAL #1   Title Pt will be indepedent with Parkinson's specific  HEP to address balance, transfers, gait for improved overall mobility.  TARGET 07/01/2021    Time 4    Period Weeks    Status Partially Met      PT SHORT TERM GOAL #2   Title Pt will improve 5x sit<>stand to less than or equal to 10 sec to demonstrate improved functional strength and transfer efficiency.    Baseline 11.66 sec    Time 4    Period Weeks    Status Partially Met      PT SHORT TERM GOAL #3   Title Pt will improve TUG score to less than or equal to 13.5 sec for decreased fall risk.    Baseline 16.19 sec    Time 4    Period Weeks    Status Partially Met      PT SHORT TERM GOAL #4   Title Pt will perform push and release test in posterior direction in 2 steps or less for improved balance recovery.    Baseline 4 steps, would fall if unaided; 2-3 steps in posterior direction and able to recover on his own    Time 4    Period Weeks    Status Partially Met               PT Long Term Goals - 06/20/21 1732       PT LONG TERM GOAL #1   Title Pt will be independent with progression of HEP for Parkinson's related deficits.  TARGET 07/29/2021    Time 8    Period Weeks    Status On-going      PT LONG TERM GOAL #2   Title Pt will perform at least 8 of 10 reps of sit<>stand from <18" surfaces, for improved transfer efficiency and safety.    Time 8    Period Weeks    Status On-going      PT LONG TERM GOAL #3   Title Pt will improve MiniBESTest score to at least 21/28 for decreased fall risk.    Baseline 14/28    Time 8    Period Weeks    Status On-going      PT LONG TERM GOAL #4   Title Pt will improve TUG cognitive score to less than or equal to 15 sec for improved dual tasking with gait.    Baseline 18.44 sec    Time 8    Period Weeks    Status On-going      PT LONG TERM GOAL #5   Title Pt will verbalize  plans for continued community fitness upon d/c from PT.    Time 8    Period Weeks    Status On-going                   Plan - 07/11/21 1532      Clinical Impression Statement Patient arrived to session without new complaints. Patient remarked on wanting to increase walking tolerance, thus worked on gait training with walking poles with cues for upright posture and AD sequencing. Patient took intermittent breaks to stop and reset gait pattern and demonstrated some instability uphill. Educated patient on parks around town that have flat paves surfaces for safety. Patient commits to taking short walk this week with walking poles. Dynamic standing balance exercises incorporated coordination of large amplitude LE and UE movements. Patient with most imbalance with backwards stepping today, requiring more practice in future sessions. Patient tolerated session well and without complaints upon leaving.    Comorbidities anxiety, arthritis, GERD, HTN, R TKR    PT Frequency 2x / week    PT Duration 8 weeks    PT Treatment/Interventions ADLs/Self Care Home Management;Gait training;Stair training;Functional mobility training;Therapeutic activities;Therapeutic exercise;Balance training;Neuromuscular re-education;DME Instruction;Patient/family education;Manual techniques;Passive range of motion    PT Next Visit Plan Check HEP additions, continue to work on balance recovery, large amplitude movement patterns, varied surfaces and gait training.  Ask about new Friday PWR! Moves ex class    Consulted and Agree with Plan of Care Patient             Patient will benefit from skilled therapeutic intervention in order to improve the following deficits and impairments:  Abnormal gait, Difficulty walking, Decreased balance, Impaired flexibility, Postural dysfunction, Decreased strength, Decreased mobility  Visit Diagnosis: Muscle weakness (generalized)  Other lack of coordination  Other symptoms and signs involving the nervous system  Abnormal posture  Unsteadiness on feet     Problem List Patient Active Problem List   Diagnosis Date Noted   S/P  TKR (total knee replacement), right 05/05/2019   Osteoarthritis of right knee 05/02/2019   Prostate cancer ([place]) 03/22/2018   Malignant neoplasm of prostate ([place]) 02/27/2018    Janene [place], PT, DPT 07/11/21 3:33 PM   [facility] [address] [place], [place], [address] Phone: [phone]   Fax:  [phone]  Name: Matthew [name] MRN: [id number] Date of Birth: 1950/10/11

## 2021-07-11 NOTE — Therapy (Signed)
[facility] [address] [place], [place], [address] Phone: [phone]   Fax:  [phone]  Speech Language Pathology Treatment  Patient Details  Name: Matthew [name] MRN: [id number] Date of Birth: 16-Aug-1950 Referring Provider (SLP): Alonza Bogus, DO   Encounter Date: 07/11/2021   End of Session - 07/11/21 1731     Visit Number 6    Number of Visits 17    Date for SLP Re-Evaluation 08/05/21    SLP Start Time 1404    SLP Stop Time  1855    SLP Time Calculation (min) 41 min    Activity Tolerance Patient tolerated treatment well             Past Medical History:  Diagnosis Date   Anxiety    Arthritis    GERD (gastroesophageal reflux disease)    occ remote history   Hypertension    Parkinson disease ([place])    Prostate cancer ([place])    Right knee pain    questionable meniscal tear   Seasonal allergies     Past Surgical History:  Procedure Laterality Date   COLONOSCOPY     Leg Laceration Repair  Left    Age 71   PELVIC LYMPH NODE DISSECTION Bilateral 03/22/2018   Procedure: PELVIC LYMPH NODE DISSECTION;  Surgeon: Ceasar Mons, MD;  Location: [facility];  Service: Urology;  Laterality: Bilateral;   PROSTATE BIOPSY     ROBOT ASSISTED LAPAROSCOPIC RADICAL PROSTATECTOMY N/A 03/22/2018   Procedure: XI ROBOTIC ASSISTED LAPAROSCOPIC PROSTATECTOMY;  Surgeon: Ceasar Mons, MD;  Location: [facility];  Service: Urology;  Laterality: N/A;   TOTAL KNEE ARTHROPLASTY Right 05/05/2019   Procedure: RIGHT TOTAL KNEE ARTHROPLASTY;  Surgeon: Frederik Pear, MD;  [facility]: [facility];  Service: Orthopedics;  Laterality: Right;    There were no vitals filed for this visit.   Subjective Assessment - 07/11/21 1420     Subjective "I  made a CD for you."    Currently in Pain? No/denies                   ADULT SLP TREATMENT - 07/11/21 1422       General Information   Behavior/Cognition Alert;Cooperative;Pleasant  mood      Treatment Provided   Treatment provided Cognitive-Linquistic      Cognitive-Linquistic Treatment   Treatment focused on Dysarthria    Skilled Treatment "She (OT) wore me out." SLP engaged pt in mod complex conversation for 10 minutes re: music and pt was suboptimal volume, "I didn't pay attention" Loud /a/ averaged low 90s dB and everyday sentences averaged in mid 80s dB. In 20 minutes of conversation pt req'd occasional min A for loudness.      Assessment / Recommendations / Plan   Plan Continue with current plan of care      Progression Toward Goals   Progression toward goals Progressing toward goals                SLP Short Term Goals - 07/11/21 1732       SLP SHORT TERM GOAL #1   Title pt will produce /a/ with average upper 80s dB at 30 cm over 3 sessions    Baseline 06-15-21. 06-22-21    Status Partially Met    Target Date 07/01/21      SLP SHORT TERM GOAL #2   Title pt will demo abdominal breathing with sentence responses 85% accuracy x 3 sessions  Status Achieved    Target Date 07/01/21      SLP SHORT TERM GOAL #3   Title pt will generate 5 minutes simple conversation with low 70s dB average in 3 sessions    Baseline 06-28-21    Status Partially Met    Target Date 07/01/21      SLP SHORT TERM GOAL #4   Title pt will undergo insturmental swallow assessment (MBSS or FEES) if clinically indicated    Time 4    Period Weeks    Status Deferred    Target Date 07/01/21              SLP Long Term Goals - 07/11/21 1732       SLP LONG TERM GOAL #1   Title pt will use abdominal breathing >80% of the time during 10 minute simple conversation in 3 sessions    Time 8    Period Weeks    Status On-going    Target Date 08/05/21      SLP LONG TERM GOAL #2   Title pt will maintain upper 60s dB average in 10 minute simple conversation x 3 sessions    Baseline 06-28-21, 07-04-21    Time 8    Period Weeks    Status On-going    Target Date 08/05/21       SLP LONG TERM GOAL #3   Title pt will report fewer requests to repeat himself than prior to ST in 3 sessions    Time 8    Period Weeks    Status On-going    Target Date 08/05/21      SLP LONG TERM GOAL #4   Title pt will produce average upper 60s dB in 10 minutes simple to mod complex conversation in 5 sessions    Baseline 06-28-21, 07-04-21    Time 8    Period Weeks    Status Revised    Target Date 08/05/21      SLP LONG TERM GOAL #5   Title pt will score better on a speech QOL questionairre than in the first 1-2 ST sessions    Time 8    Period Weeks    Status On-going    Target Date 08/05/21              Plan - 07/11/21 1731     Clinical Impression Statement Matthew [name]") presents today with 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 Today pt demonstrated more difficulty maintaining WNL volume with fatigue. SLP believes pt will cont to benefit from skilled ST targeting speech loudness and clarity. Pt may require an instrumental swallow evaluation during this therapy course and DO signature on this plan of care will be understood to mean that referral for MBSS or FEES is provided.    Speech Therapy Frequency 2x / week    Duration 8 weeks    Treatment/Interventions Aspiration precaution training;Language facilitation;Environmental controls;Cueing hierarchy;SLP instruction and feedback;Compensatory techniques;Functional tasks;Compensatory strategies;Patient/family education;Diet toleration management by SLP;Other (comment)    Potential to Achieve Goals Good    SLP Home Exercise Plan provided    Consulted and Agree with Plan of Care Patient             Patient will benefit from skilled therapeutic intervention in order to improve the following deficits and impairments:   Dysarthria and anarthria  Dysphagia, unspecified type  Aphasia  Cognitive communication deficit    Problem List Patient  Active Problem List   Diagnosis Date Noted   S/P TKR (total knee replacement), right 05/05/2019   Osteoarthritis of right knee 05/02/2019   Prostate cancer ([place]) 03/22/2018   Malignant neoplasm of prostate ([place]) 02/27/2018    [place], [place] 07/11/2021, 5:33 PM  [facility] [address] [place], [place], [address] Phone: [phone]   Fax:  [phone]   Name: Matthew [name] MRN: [id number] Date of Birth: 1950/12/16

## 2021-07-11 NOTE — Patient Instructions (Signed)
Bag Exercises:  Small trash bag or produce bag works best.  For all exercises, sit with big posture (sit up tall with head up) and use big movements. Perform the following exercises 1 times per day.  Hold bag in one hand. Stretch both arms/hands out to the side in a diagonal as big as you can. Then, pass bag from one hand to the other IN FRONT of you in a diagonal pattern. Stretch arms back out big after each pass. Repeat 10 times. Hold bag in both hands in front of you with hands/arms shoulder length apart. Move bag behind your head. Repeat 10 times. Hold bag in both hands in front of you with hands/arms shoulder length apart. Lift leg and move bag completely under each foot and back. Repeat 10 times on each side.

## 2021-07-12 ENCOUNTER — Ambulatory Visit: Payer: PPO | Admitting: Neurology

## 2021-07-13 ENCOUNTER — Encounter: Payer: Self-pay | Admitting: Physical Therapy

## 2021-07-13 ENCOUNTER — Ambulatory Visit: Payer: PPO | Admitting: Occupational Therapy

## 2021-07-13 ENCOUNTER — Ambulatory Visit: Payer: PPO | Admitting: Physical Therapy

## 2021-07-13 ENCOUNTER — Other Ambulatory Visit: Payer: Self-pay

## 2021-07-13 ENCOUNTER — Encounter: Payer: Self-pay | Admitting: Occupational Therapy

## 2021-07-13 DIAGNOSIS — M6281 Muscle weakness (generalized): Secondary | ICD-10-CM | POA: Diagnosis not present

## 2021-07-13 DIAGNOSIS — R2681 Unsteadiness on feet: Secondary | ICD-10-CM

## 2021-07-13 DIAGNOSIS — R29818 Other symptoms and signs involving the nervous system: Secondary | ICD-10-CM

## 2021-07-13 DIAGNOSIS — R293 Abnormal posture: Secondary | ICD-10-CM

## 2021-07-13 DIAGNOSIS — R278 Other lack of coordination: Secondary | ICD-10-CM

## 2021-07-13 NOTE — Therapy (Signed)
[facility] [address] [place], [place], [address] Phone: [phone]   Fax:  [phone]  Physical Therapy Treatment  Patient Details  Name: Matthew [name] MRN: [id number] Date of Birth: 09-25-1950 Referring Provider (PT): Tat, Wells Guiles   Encounter Date: 07/13/2021   PT End of Session - 07/13/21 1448     Visit Number 9    Number of Visits 17    Date for PT Re-Evaluation 07/29/21    Authorization Type [employer]    Progress Note Due on Visit 10    PT Start Time 7616    PT Stop Time 0737    PT Time Calculation (min) 38 min    Equipment Utilized During Treatment Gait belt    Activity Tolerance Patient tolerated treatment well    Behavior During Therapy [name] for tasks assessed/performed;Flat affect             Past Medical History:  Diagnosis Date   Anxiety    Arthritis    GERD (gastroesophageal reflux disease)    occ remote history   Hypertension    Parkinson disease ([place])    Prostate cancer ([place])    Right knee pain    questionable meniscal tear   Seasonal allergies     Past Surgical History:  Procedure Laterality Date   COLONOSCOPY     Leg Laceration Repair  Left    Age 71   PELVIC LYMPH NODE DISSECTION Bilateral 03/22/2018   Procedure: PELVIC LYMPH NODE DISSECTION;  Surgeon: Ceasar Mons, MD;  [facility];  Service: Urology;  Laterality: Bilateral;   PROSTATE BIOPSY     ROBOT ASSISTED LAPAROSCOPIC RADICAL PROSTATECTOMY N/A 03/22/2018   Procedure: XI ROBOTIC ASSISTED LAPAROSCOPIC PROSTATECTOMY;  Surgeon: Ceasar Mons, MD;  [facility];  Service: Urology;  Laterality: N/A;   TOTAL KNEE ARTHROPLASTY Right 05/05/2019   Procedure: RIGHT TOTAL KNEE ARTHROPLASTY;  Surgeon: Frederik Pear, MD;  [facility];  Service: Orthopedics;  Laterality: Right;    There were no vitals filed for this visit.   Subjective Assessment - 07/13/21 1406     Subjective Not much new.  Walked down the street with his walking poles in the neighborhood; found it kind of hard on the incline.    Pertinent History Shoulder pain and back pain (?arthritis); saw orthopedist to f/u wtih R knee (pain from TKR) and mild tendonitis    Patient Stated Goals Pt's goals for therapy are to move normally and move better.  Wife-not fall.    Currently in Pain? No/denies                               [facility] Adult PT Treatment/Exercise - 07/13/21 0001       Neuro Re-ed    Neuro Re-ed Details  resisted forward walk with controlled steps back, resisted backward walk with controlled steps forward in II bars; R?L backwards step over large foam roll with CGA 10x each   focus on decreasing number of steps and to increase step length inctead     Knee/Hip Exercises: Stretches   Gastroc Stretch Right;Left;30 seconds;2 reps    Gastroc Stretch Limitations runner's stretch      Knee/Hip Exercises: Aerobic   Nustep NuStep, Level 5, 4 extremities x 7 minutes, cues to keep steps/min>80  PT Education - 07/13/21 1445     Education Details update to HEP- Access Code: [email or web address]    Person(s) Educated Patient    Methods Explanation;Demonstration;Tactile cues;Verbal cues;Handout    Comprehension Verbalized understanding;Returned demonstration              PT Short Term Goals - 07/04/21 1337       PT SHORT TERM GOAL #1   Title Pt will be indepedent with Parkinson's specific HEP to address balance, transfers, gait for improved overall mobility.  TARGET 07/01/2021    Time 4    Period Weeks    Status Partially Met      PT SHORT TERM GOAL #2   Title Pt will improve 5x sit<>stand to less than or equal to 10 sec to demonstrate improved functional strength and transfer efficiency.    Baseline 11.66 sec    Time 4    Period Weeks    Status Partially Met      PT SHORT TERM GOAL #3   Title Pt will improve TUG score to less than or equal to 13.5 sec for  decreased fall risk.    Baseline 16.19 sec    Time 4    Period Weeks    Status Partially Met      PT SHORT TERM GOAL #4   Title Pt will perform push and release test in posterior direction in 2 steps or less for improved balance recovery.    Baseline 4 steps, would fall if unaided; 2-3 steps in posterior direction and able to recover on his own    Time 4    Period Weeks    Status Partially Met               PT Long Term Goals - 06/20/21 1732       PT LONG TERM GOAL #1   Title Pt will be independent with progression of HEP for Parkinson's related deficits.  TARGET 07/29/2021    Time 8    Period Weeks    Status On-going      PT LONG TERM GOAL #2   Title Pt will perform at least 8 of 10 reps of sit<>stand from <18" surfaces, for improved transfer efficiency and safety.    Time 8    Period Weeks    Status On-going      PT LONG TERM GOAL #3   Title Pt will improve MiniBESTest score to at least 21/28 for decreased fall risk.    Baseline 14/28    Time 8    Period Weeks    Status On-going      PT LONG TERM GOAL #4   Title Pt will improve TUG cognitive score to less than or equal to 15 sec for improved dual tasking with gait.    Baseline 18.44 sec    Time 8    Period Weeks    Status On-going      PT LONG TERM GOAL #5   Title Pt will verbalize plans for continued community fitness upon d/c from PT.    Time 8    Period Weeks    Status On-going                   Plan - 07/13/21 1448     Clinical Impression Statement Patient arrived to session with report of taking a walk in his neighborhood with his walking poles since last session. Worked on backwards walking and stepping activities today. Patient required cues to  focus on decreasing number of steps and to increase step length. Required cues to widen BOS before performing backwards stepping over obstacle which seemed to improve stability. Updated HEP with backwards walking and gastroc stretch as this was safely  performed today. Patient reported understanding of HEP and without complaints at end of session.    Comorbidities anxiety, arthritis, GERD, HTN, R TKR    PT Frequency 2x / week    PT Duration 8 weeks    PT Treatment/Interventions ADLs/Self Care Home Management;Gait training;Stair training;Functional mobility training;Therapeutic activities;Therapeutic exercise;Balance training;Neuromuscular re-education;DME Instruction;Patient/family education;Manual techniques;Passive range of motion    PT Next Visit Plan Check HEP additions, continue to work on balance recovery, large amplitude movement patterns, varied surfaces and gait training.  Ask about new Friday PWR! Moves ex class    Consulted and Agree with Plan of Care Patient             Patient will benefit from skilled therapeutic intervention in order to improve the following deficits and impairments:  Abnormal gait, Difficulty walking, Decreased balance, Impaired flexibility, Postural dysfunction, Decreased strength, Decreased mobility  Visit Diagnosis: Muscle weakness (generalized)  Other lack of coordination  Other symptoms and signs involving the nervous system  Abnormal posture  Unsteadiness on feet     Problem List Patient Active Problem List   Diagnosis Date Noted   S/P TKR (total knee replacement), right 05/05/2019   Osteoarthritis of right knee 05/02/2019   Prostate cancer ([place]) 03/22/2018   Malignant neoplasm of prostate ([place]) 02/27/2018    Janene Harvey, PT, DPT 07/13/21 2:53 PM   [facility] [address], [place], [place], [address] Phone: [phone]   Fax:  [phone]  Name: Matthew [name] MRN: [id number] Date of Birth: Mar 27, 1951

## 2021-07-13 NOTE — Patient Instructions (Signed)
Access Code: [email or web address] URL: [email or web address] Date: 07/13/2021 Prepared by: [facility]  Exercises Sit to Stand - 1 x daily - 5 x weekly - 2 sets - 5 reps Seated Hamstring Stretch - 1-2 x daily - 7 x weekly - 1 sets - 3 reps - 30 sec hold Standing Quarter Turn with Counter Support - 1 x daily - 5 x weekly - 1-2 sets - 10 reps Staggered Stance Forward Backward Weight Shift with Counter Support - 1 x daily - 7 x weekly - 1-2 sets - 10 reps Gastroc Stretch on Wall - 1 x daily - 5 x weekly - 2 sets - 30 sec hold Backward Walking with Counter Support - 1 x daily - 5 x weekly - 2 sets - 4 reps

## 2021-07-13 NOTE — Therapy (Signed)
[facility] [address], [place], [place], [address] Phone: [phone]   Fax:  [phone]  Occupational Therapy Treatment  Patient Details  Name: Matthew [name] MRN: [id number] Date of Birth: 1950-11-01 Referring Provider (OT): Tat, Eustace Quail, DO   Encounter Date: 07/13/2021   OT End of Session - 07/13/21 1323     Visit Number 10    Number of Visits 17    Date for OT Re-Evaluation 07/29/21    Authorization Type [employer]    OT Start Time 1318    OT Stop Time 1400    OT Time Calculation (min) 42 min    Activity Tolerance Patient tolerated treatment well    Behavior During Therapy [facility] for tasks assessed/performed;Flat affect             Past Medical History:  Diagnosis Date   Anxiety    Arthritis    GERD (gastroesophageal reflux disease)    occ remote history   Hypertension    Parkinson disease ([place])    Prostate cancer ([place])    Right knee pain    questionable meniscal tear   Seasonal allergies     Past Surgical History:  Procedure Laterality Date   COLONOSCOPY     Leg Laceration Repair  Left    Age 71   PELVIC LYMPH NODE DISSECTION Bilateral 03/22/2018   Procedure: PELVIC LYMPH NODE DISSECTION;  Surgeon: Ceasar Mons, MD;  [facility];  Service: Urology;  Laterality: Bilateral;   PROSTATE BIOPSY     ROBOT ASSISTED LAPAROSCOPIC RADICAL PROSTATECTOMY N/A 03/22/2018   Procedure: XI ROBOTIC ASSISTED LAPAROSCOPIC PROSTATECTOMY;  Surgeon: Ceasar Mons, MD;  [facility];  Service: Urology;  Laterality: N/A;   TOTAL KNEE ARTHROPLASTY Right 05/05/2019   Procedure: RIGHT TOTAL KNEE ARTHROPLASTY;  Surgeon: Frederik Pear, MD;  [facility];  Service: Orthopedics;  Laterality: Right;    There were no vitals filed for this visit.   Subjective Assessment - 07/13/21 1324     Subjective  Pt reports noticing improvements with reaching for seat belt, however with difficulty  pushing into seat belt buckle.    Patient is accompanied by: Family member    Pertinent History anxiety, arthritis, GERD, HTN, R TKR    Patient Stated Goals to be able to function better    Currently in Pain? No/denies    Pain Onset More than a month ago              Problem solving phone use.  Pt reports increased difficulty with texting and managing apps, demonstrating frequent accidental opening of wrong apps or selecting incorrect letters when attempting to text.  Recommend finger flicks and closed fist with only index finger out to decrease accidental bumps on other apps or letters when typing.  Also recommended use of stylus to decrease accidental pressing of other apps or letters.    Big hand flicks before engaging in [facility] tasks with buttoning/unbuttoning shirt.  Pt reports "I frequently forget to do these" before activities.  Pt requiring increased time to fasten and unfasten buttons.  Therapist introduced button hook to pt and demonstrated use.  Pt not interested in use of button hook at this time.  Therapist reiterated use of Big hands to open hands fully, then grasp edge of button hole strip on shirt to pull over button.  Pt with improved success.  OT Short Term Goals - 06/28/21 1322       OT SHORT TERM GOAL #1   Title Pt will be Independent with PD specific HEP    Time 4    Period Weeks    Status Achieved    Target Date 07/01/21      OT SHORT TERM GOAL #2   Title Pt will verbalize understanding of adapted strategies to maximize safety and I with ADLs/ IADLs .    Time 4    Period Weeks    Status Achieved      OT SHORT TERM GOAL #3   Title Pt will retrieve a lightweight object from overhead shelf at 130 shoulder flexion and -5 elbow extension with left and right UE's    Baseline R: shoulder 124 and elbow -16  L: shoulder 124 and elbow - 8    Time 4    Period Weeks    Status Achieved      OT SHORT TERM GOAL #4   Title Pt will  demonstrate increased ease with dressing as evidenced by decreasing PPT#4(don/ doff jacket) by 30 seconds    Baseline > 2 mins.  06/15/21 1:36.37    Time 4    Period Weeks    Status On-going               OT Long Term Goals - 06/15/21 1056       OT LONG TERM GOAL #1   Title Pt will verbalize understanding of ways to prevent future PD related complications and PD community resources following review    Time 8    Period Weeks    Status On-going    Target Date 07/29/21      OT LONG TERM GOAL #2   Title Pt will demonstrate improved fine motor coordination for ADLs as evidenced by decreasing 9 hole peg test score for RUE by 3 secs    Baseline R: 46.28 and L: 46.75    Time 8    Period Weeks    Status On-going      OT LONG TERM GOAL #3   Title Pt will demonstrate improved ease with dressing as eveidenced by decreasing 3 button/ unbutton time to 2 min or less    Baseline 4:18.1    Time 8    Period Weeks    Status On-going      OT LONG TERM GOAL #4   Title Pt will report pain in right shoulder no greater than 2/10 for ADLs    Time 8    Period Weeks    Status On-going      OT LONG TERM GOAL #5   Title Pt will demonstrate improved ease with feeding as evidenced by decreasing PPT#2 (self feeding) by 3 secs    Baseline 20.81    Time 8    Period Weeks    Status On-going                   Plan - 07/13/21 1323     Clinical Impression Statement Pt continues to be limited by small, repetitive movements with tasks reporting that he frequently forgets to use large amplitude movements and hand flicks prior to [facility] tasks.  Pt receptive to adaptive techniques or tools for increased success with use of phone.  Pt continues to benefit from education on big amplitude movements in all functional tasks.    OT Occupational Profile and History Detailed Assessment- Review of Records and additional review of  physical, cognitive, psychosocial history related to current functional  performance    Occupational performance deficits (Please refer to evaluation for details): ADL's;IADL's;Leisure    Body Structure / Function / Physical Skills ADL;Balance;Body mechanics;Coordination;Endurance;Flexibility;FMC;GMC;IADL;Mobility;Pain;ROM;Sensation;Strength;Tone;UE functional use    Rehab Potential Fair    Clinical Decision Making Limited treatment options, no task modification necessary    Comorbidities Affecting Occupational Performance: May have comorbidities impacting occupational performance    Modification or Assistance to Complete Evaluation  No modification of tasks or assist necessary to complete eval    OT Frequency 2x / week    OT Duration 8 weeks    OT Treatment/Interventions Self-care/ADL training;Cryotherapy;Electrical Stimulation;Moist Heat;Ultrasound;Iontophoresis;Therapeutic exercise;Neuromuscular education;Energy conservation;DME and/or AE instruction;Functional Mobility Training;Manual Therapy;Passive range of motion;Therapeutic activities;Cognitive remediation/compensation;Patient/family education;Balance training;Psychosocial skills training    Plan scooting chair into table, seat belt, LB dressing wiht reaching and figure 4    OT Home Exercise Plan bag exercises - pt instructions    Consulted and Agree with Plan of Care Patient             Patient will benefit from skilled therapeutic intervention in order to improve the following deficits and impairments:   Body Structure / Function / Physical Skills: ADL, Balance, Body mechanics, Coordination, Endurance, Flexibility, FMC, GMC, IADL, Mobility, Pain, ROM, Sensation, Strength, Tone, UE functional use       Visit Diagnosis: Muscle weakness (generalized)  Other lack of coordination  Other symptoms and signs involving the nervous system  Abnormal posture  Unsteadiness on feet    Problem List Patient Active Problem List   Diagnosis Date Noted   S/P TKR (total knee replacement), right 05/05/2019    Osteoarthritis of right knee 05/02/2019   Prostate cancer ([place]) 03/22/2018   Malignant neoplasm of prostate ([place]) 02/27/2018    Simonne Come, OT 07/13/2021, 3:05 PM  [facility] [address] [place], [place], [address] Phone: [phone]   Fax:  [phone]  Name: Matthew [name] MRN: [id number] Date of Birth: 1950-11-23

## 2021-07-18 ENCOUNTER — Other Ambulatory Visit: Payer: Self-pay

## 2021-07-18 ENCOUNTER — Ambulatory Visit: Payer: PPO | Admitting: Occupational Therapy

## 2021-07-18 ENCOUNTER — Ambulatory Visit: Payer: PPO | Admitting: Physical Therapy

## 2021-07-18 ENCOUNTER — Ambulatory Visit: Payer: PPO

## 2021-07-18 ENCOUNTER — Encounter: Payer: Self-pay | Admitting: Occupational Therapy

## 2021-07-18 DIAGNOSIS — M6281 Muscle weakness (generalized): Secondary | ICD-10-CM

## 2021-07-18 DIAGNOSIS — R471 Dysarthria and anarthria: Secondary | ICD-10-CM

## 2021-07-18 DIAGNOSIS — R2681 Unsteadiness on feet: Secondary | ICD-10-CM

## 2021-07-18 DIAGNOSIS — R278 Other lack of coordination: Secondary | ICD-10-CM

## 2021-07-18 DIAGNOSIS — R293 Abnormal posture: Secondary | ICD-10-CM

## 2021-07-18 DIAGNOSIS — R1310 Dysphagia, unspecified: Secondary | ICD-10-CM

## 2021-07-18 DIAGNOSIS — R29818 Other symptoms and signs involving the nervous system: Secondary | ICD-10-CM

## 2021-07-18 DIAGNOSIS — R41841 Cognitive communication deficit: Secondary | ICD-10-CM

## 2021-07-18 DIAGNOSIS — R4701 Aphasia: Secondary | ICD-10-CM

## 2021-07-18 NOTE — Therapy (Signed)
[facility] [address], [place], [place], [address] Phone: [phone]   Fax:  [phone]  Occupational Therapy Treatment  Patient Details  Name: Matthew [name] MRN: [id number] Date of Birth: 1950/09/02 Referring Provider (OT): Tat, Eustace Quail, DO   Encounter Date: 07/18/2021   OT End of Session - 07/18/21 1418     Visit Number 11    Number of Visits 17    Date for OT Re-Evaluation 07/29/21    Authorization Type [employer]    OT Start Time 1408    OT Stop Time 1448    OT Time Calculation (min) 40 min    Activity Tolerance Patient tolerated treatment well    Behavior During Therapy [facility] for tasks assessed/performed;Flat affect             Past Medical History:  Diagnosis Date   Anxiety    Arthritis    GERD (gastroesophageal reflux disease)    occ remote history   Hypertension    Parkinson disease ([place])    Prostate cancer ([place])    Right knee pain    questionable meniscal tear   Seasonal allergies     Past Surgical History:  Procedure Laterality Date   COLONOSCOPY     Leg Laceration Repair  Left    Age 71   PELVIC LYMPH NODE DISSECTION Bilateral 03/22/2018   Procedure: PELVIC LYMPH NODE DISSECTION;  Surgeon: Ceasar Mons, MD;  Location: [facility];  Service: Urology;  Laterality: Bilateral;   PROSTATE BIOPSY     ROBOT ASSISTED LAPAROSCOPIC RADICAL PROSTATECTOMY N/A 03/22/2018   Procedure: XI ROBOTIC ASSISTED LAPAROSCOPIC PROSTATECTOMY;  Surgeon: Ceasar Mons, MD;  [facility]: [facility];  Service: Urology;  Laterality: N/A;   TOTAL KNEE ARTHROPLASTY Right 05/05/2019   Procedure: RIGHT TOTAL KNEE ARTHROPLASTY;  Surgeon: Frederik Pear, MD;  [facility];  Service: Orthopedics;  Laterality: Right;    There were no vitals filed for this visit.   Subjective Assessment - 07/18/21 1429     Subjective  Pt reports going to [facility] over the weekend.    Patient is accompanied by:  Family member    Pertinent History anxiety, arthritis, GERD, HTN, R TKR    Patient Stated Goals to be able to function better    Currently in Pain? No/denies              Focus on large amplitude movements with scooting chair in towards table.  Pt demonstrating improvements with carry over, however still requires cues for large scoot of chair instead of small scoots.  Pt demonstrating improvements in control with self-feeding PPT#2 task completing in 13.5 seconds.  Engaged in button/unbutton with pt initially requiring 2:29 to complete 3 buttons.  Therapist providing demonstration of big movements and pushing button through hole.  Pt demonstrating significant improvements with ability to complete fastening of 2 buttons in ~30 seconds.                        OT Short Term Goals - 06/28/21 1322       OT SHORT TERM GOAL #1   Title Pt will be Independent with PD specific HEP    Time 4    Period Weeks    Status Achieved    Target Date 07/01/21      OT SHORT TERM GOAL #2   Title Pt will verbalize understanding of adapted strategies to maximize safety and I with  ADLs/ IADLs .    Time 4    Period Weeks    Status Achieved      OT SHORT TERM GOAL #3   Title Pt will retrieve a lightweight object from overhead shelf at 130 shoulder flexion and -5 elbow extension with left and right UE's    Baseline R: shoulder 124 and elbow -16  L: shoulder 124 and elbow - 8    Time 4    Period Weeks    Status Achieved      OT SHORT TERM GOAL #4   Title Pt will demonstrate increased ease with dressing as evidenced by decreasing PPT#4(don/ doff jacket) by 30 seconds    Baseline > 2 mins.  06/15/21 1:36.37    Time 4    Period Weeks    Status On-going               OT Long Term Goals - 07/18/21 1427       OT LONG TERM GOAL #1   Title Pt will verbalize understanding of ways to prevent future PD related complications and PD community resources following review    Time 8     Period Weeks    Status On-going    Target Date 07/29/21      OT LONG TERM GOAL #2   Title Pt will demonstrate improved fine motor coordination for ADLs as evidenced by decreasing 9 hole peg test score for RUE by 3 secs    Baseline R: 46.28 and L: 46.75    Time 8    Period Weeks    Status On-going      OT LONG TERM GOAL #3   Title Pt will demonstrate improved ease with dressing as eveidenced by decreasing 3 button/ unbutton time to 2 min or less    Baseline 4:18.1   2:29.06   Time 8    Period Weeks    Status On-going      OT LONG TERM GOAL #4   Title Pt will report pain in right shoulder no greater than 2/10 for ADLs    Time 8    Period Weeks    Status Achieved      OT LONG TERM GOAL #5   Title Pt will demonstrate improved ease with feeding as evidenced by decreasing PPT#2 (self feeding) by 3 secs   13.5 seconds   Baseline 20.81    Time 8    Period Weeks    Status Achieved                   Plan - 07/18/21 1427     Clinical Impression Statement Pt continues to be limited by small, repetitive movements, benefiting from cues to utilize large amplitude movements during all functional tasks.  Pt demonstrating improvements in ability to fasten buttons, scoot chair in towards table, and increased control with self-feeding.    OT Occupational Profile and History Detailed Assessment- Review of Records and additional review of physical, cognitive, psychosocial history related to current functional performance    Occupational performance deficits (Please refer to evaluation for details): ADL's;IADL's;Leisure    Body Structure / Function / Physical Skills ADL;Balance;Body mechanics;Coordination;Endurance;Flexibility;FMC;GMC;IADL;Mobility;Pain;ROM;Sensation;Strength;Tone;UE functional use    Rehab Potential Fair    Clinical Decision Making Limited treatment options, no task modification necessary    Comorbidities Affecting Occupational Performance: May have comorbidities  impacting occupational performance    Modification or Assistance to Complete Evaluation  No modification of tasks or assist necessary to complete eval  OT Frequency 2x / week    OT Duration 8 weeks    OT Treatment/Interventions Self-care/ADL training;Cryotherapy;Electrical Stimulation;Moist Heat;Ultrasound;Iontophoresis;Therapeutic exercise;Neuromuscular education;Energy conservation;DME and/or AE instruction;Functional Mobility Training;Manual Therapy;Passive range of motion;Therapeutic activities;Cognitive remediation/compensation;Patient/family education;Balance training;Psychosocial skills training    Plan LB dressing with reaching and figure 4, functional reach, check off goals and possible d/c    OT Home Exercise Plan bag exercises - pt instructions    Consulted and Agree with Plan of Care Patient             Patient will benefit from skilled therapeutic intervention in order to improve the following deficits and impairments:   Body Structure / Function / Physical Skills: ADL, Balance, Body mechanics, Coordination, Endurance, Flexibility, FMC, GMC, IADL, Mobility, Pain, ROM, Sensation, Strength, Tone, UE functional use       Visit Diagnosis: Muscle weakness (generalized)  Other lack of coordination  Other symptoms and signs involving the nervous system  Abnormal posture  Unsteadiness on feet    Problem List Patient Active Problem List   Diagnosis Date Noted   S/P TKR (total knee replacement), right 05/05/2019   Osteoarthritis of right knee 05/02/2019   Prostate cancer ([place]) 03/22/2018   Malignant neoplasm of prostate ([place]) 02/27/2018    Simonne Come, OT 07/19/2021, 7:58 AM  [facility] [address] [place], [place], [address] Phone: [phone]   Fax:  [phone]  Name: Tillman Kazmierski MRN: [id number] Date of Birth: [birth year]

## 2021-07-18 NOTE — Therapy (Signed)
[facility] [address] [place], [place], [address] Phone: [phone]   Fax:  [phone]  Physical Therapy Progress Note  Patient Details  Name: Matthew [name] MRN: [id number] Date of Birth: [birth year] Referring Provider (PT): Tat, Wells Guiles  Progress Note Reporting Period 06/01/21 to 07/18/21  See note below for Objective Data and Assessment of Progress/Goals.     Encounter Date: 07/18/2021   PT End of Session - 07/18/21 1536     Visit Number 10    Number of Visits 16    Date for PT Re-Evaluation 08/29/21    Authorization Type [employer]    Progress Note Due on Visit 10    PT Start Time 1448    PT Stop Time 1529    PT Time Calculation (min) 41 min    Equipment Utilized During Treatment Gait belt    Activity Tolerance Patient tolerated treatment well    Behavior During Therapy WFL for tasks assessed/performed;Flat affect             Past Medical History:  Diagnosis Date   Anxiety    Arthritis    GERD (gastroesophageal reflux disease)    occ remote history   Hypertension    Parkinson disease ([place])    Prostate cancer ([place])    Right knee pain    questionable meniscal tear   Seasonal allergies     Past Surgical History:  Procedure Laterality Date   COLONOSCOPY     Leg Laceration Repair  Left    Age 32   PELVIC LYMPH NODE DISSECTION Bilateral 03/22/2018   Procedure: PELVIC LYMPH NODE DISSECTION;  Surgeon: Ceasar Mons, MD;  [facility];  Service: Urology;  Laterality: Bilateral;   PROSTATE BIOPSY     ROBOT ASSISTED LAPAROSCOPIC RADICAL PROSTATECTOMY N/A 03/22/2018   Procedure: XI ROBOTIC ASSISTED LAPAROSCOPIC PROSTATECTOMY;  Surgeon: Ceasar Mons, MD;  [facility];  Service: Urology;  Laterality: N/A;   TOTAL KNEE ARTHROPLASTY Right 05/05/2019   Procedure: RIGHT TOTAL KNEE ARTHROPLASTY;  Surgeon: Frederik Pear, MD;  [facility];  Service: Orthopedics;   Laterality: Right;    There were no vitals filed for this visit.   Subjective Assessment - 07/18/21 1449     Subjective DOing okay. Getting a little bit tired. Has been compliant with HEP and denies questions. Feels some improvement, however has been up and down with his medications. Still feels a little wobbly with his balance, particularly when standing up after sitting for a while.    Pertinent History Shoulder pain and back pain (?arthritis); saw orthopedist to f/u wtih R knee (pain from TKR) and mild tendonitis    Patient Stated Goals Pt's goals for therapy are to move normally and move better.  Wife-not fall.    Currently in Pain? No/denies                [facility] PT Assessment - 07/18/21 1456       Assessment   Medical Diagnosis Parkinson's disease    Referring Provider (PT) Tat, Wells Guiles    Onset Date/Surgical Date 05/26/21      Standardized Balance Assessment   Standardized Balance Assessment Five Times Sit to Stand    Five times sit to stand comments  11.66   without UEs     Mini-BESTest   Sit To Stand Normal: Comes to stand without use of hands and stabilizes independently.    Rise to Toes Normal: Stable for 3 s  with maximum height.    Stand on one leg (left) Moderate: < 20 s    Stand on one leg (right) Normal: 20 s.    Stand on one leg - lowest score 1    Compensatory Stepping Correction - Forward Moderate: More than one step is required to recover equilibrium    Compensatory Stepping Correction - Backward No step, OR would fall if not caught, OR falls spontaneously.    Compensatory Stepping Correction - Left Lateral Moderate: Several steps to recover equilibrium    Compensatory Stepping Correction - Right Lateral Moderate: Several steps to recover equilibrium    Stepping Corredtion Lateral - lowest score 1    Stance - Feet together, eyes open, firm surface  Normal: 30s    Stance - Feet together, eyes closed, foam surface  Normal: 30s    Incline - Eyes Closed  Moderate: Stands independently < 30s OR aligns with surface    Change in Gait Speed Normal: Significantly changes walkling speed without imbalance    Walk with head turns - Horizontal Moderate: performs head turns with reduction in gait speed.    Walk with pivot turns Moderate:Turns with feet close SLOW (>4 steps) with good balance.    Step over obstacles Normal: Able to step over box with minimal change of gait speed and with good balance.    Timed UP & GO with Dual Task Moderate: Dual Task affects either counting OR walking (>10%) when compared to the TUG without Dual Task.    Mini-BEST total score 19      Timed Up and Go Test   Normal TUG (seconds) 10.72    Manual TUG (seconds) 11.95    Cognitive TUG (seconds) 13.22    TUG Comments Scores >13.5-15 sec indicates increased fall risk                                    PT Education - 07/18/21 1536     Education Details discussion on objective progress and remaining impairments; edu on PWR moves class and benefits of fitness regimen after PT D/C    Person(s) Educated Patient    Methods Explanation    Comprehension Verbalized understanding              PT Short Term Goals - 07/18/21 1522       PT SHORT TERM GOAL #1   Title Pt will be indepedent with Parkinson's specific HEP to address balance, transfers, gait for improved overall mobility.  TARGET 07/01/2021    Time 4    Period Weeks    Status Achieved      PT SHORT TERM GOAL #2   Title Pt will improve 5x sit<>stand to less than or equal to 10 sec to demonstrate improved functional strength and transfer efficiency.    Baseline 11.66 sec    Time 3    Period Weeks    Status Partially Met   11 sec   Target Date 08/08/21      PT SHORT TERM GOAL #3   Title Pt will improve TUG score to less than or equal to 13.5 sec for decreased fall risk.    Baseline 16.19 sec    Time 4    Period Weeks    Status Achieved      PT SHORT TERM GOAL #4   Title Pt  will perform push and release test in posterior direction in 2  steps or less for improved balance recovery.    Baseline 4 steps, would fall if unaided; 2-3 steps in posterior direction and able to recover on his own    Time 3    Period Weeks    Status Partially Met   7 steps   Target Date 08/08/21               PT Long Term Goals - 07/18/21 1522       PT LONG TERM GOAL #1   Title Pt will be independent with progression of HEP for Parkinson's related deficits.  TARGET 07/29/2021    Time 6    Period Weeks    Status On-going    Target Date 08/29/21      PT LONG TERM GOAL #2   Title Pt will perform at least 8 of 10 reps of sit<>stand from <18" surfaces, for improved transfer efficiency and safety.    Time 8    Period Weeks    Status Achieved      PT LONG TERM GOAL #3   Title Pt will improve MiniBESTest score to at least 21/28 for decreased fall risk.    Baseline 14/28    Time 6    Period Weeks    Status On-going   19/28   Target Date 08/29/21      PT LONG TERM GOAL #4   Title Pt will improve TUG cognitive score to less than or equal to 15 sec for improved dual tasking with gait.    Baseline 18.44 sec    Time 8    Period Weeks    Status Achieved      PT LONG TERM GOAL #5   Title Pt will verbalize plans for continued community fitness upon d/c from PT.    Time 6    Period Weeks    Status On-going   contemplating cycling class, walking for exercise, PWR class   Target Date 08/29/21                   Plan - 07/18/21 1537     Clinical Impression Statement Patient arrived to session without new complaints. Reports compliance with HEP and denies questions. Provided information on PWR classes and benefit of continued fitness after PT. Patient scored 19/28 on Mini Best, indicating an increased risk of falls, however improved from initial testing. TUG scores have improve significantly and demonstrate a decreased risk of falls, however patient still required increased  time with dual tasking challenges. Patient able to complete 8 STS from low seat without issue, and improved 5xSTS score to 11 sec. Multiple steps (4+) required for push and release in lateral and  posterior directions. Overall patient is demonstrating good progress towards goals. Would benefit from additional skilled PT services 1x/week or 6 weeks to address remaining goals.    Comorbidities anxiety, arthritis, GERD, HTN, R TKR    PT Frequency 1x / week    PT Duration 6 weeks    PT Treatment/Interventions ADLs/Self Care Home Management;Gait training;Stair training;Functional mobility training;Therapeutic activities;Therapeutic exercise;Balance training;Neuromuscular re-education;DME Instruction;Patient/family education;Manual techniques;Passive range of motion    PT Next Visit Plan Check HEP additions, continue to work on balance recovery, large amplitude movement patterns, varied surfaces and gait training; reactive balance    Consulted and Agree with Plan of Care Patient             Patient will benefit from skilled therapeutic intervention in order to improve the following deficits and impairments:  Abnormal gait, Difficulty walking, Decreased balance, Impaired flexibility, Postural dysfunction, Decreased strength, Decreased mobility  Visit Diagnosis: Muscle weakness (generalized)  Other lack of coordination  Other symptoms and signs involving the nervous system  Abnormal posture  Unsteadiness on feet     Problem List Patient Active Problem List   Diagnosis Date Noted   S/P TKR (total knee replacement), right 05/05/2019   Osteoarthritis of right knee 05/02/2019   Prostate cancer ([place]) 03/22/2018   Malignant neoplasm of prostate ([place]) 02/27/2018    Janene Harvey, PT, DPT 07/18/21 3:41 PM   [facility] [address] [place], [place], [address] Phone: [phone]   Fax:  [phone]  Name: Matthew [name] MRN:  [id number] Date of Birth: 1950-07-22

## 2021-07-18 NOTE — Therapy (Signed)
[name] [address], [place], [place], [address] Phone: [phone]   Fax:  [phone]  Speech Language Pathology Treatment  Patient Details  Name: Matthew [name] MRN: [id number] Date of Birth: 03/13/1951 Referring Provider (SLP): Alonza Bogus, DO   Encounter Date: 07/18/2021   End of Session - 07/18/21 2321     Visit Number 7    Number of Visits 17    Date for SLP Re-Evaluation 08/05/21    SLP Start Time 1320    SLP Stop Time  1400    SLP Time Calculation (min) 40 min    Activity Tolerance Patient tolerated treatment well             Past Medical History:  Diagnosis Date   Anxiety    Arthritis    GERD (gastroesophageal reflux disease)    occ remote history   Hypertension    Parkinson disease ([place])    Prostate cancer ([place])    Right knee pain    questionable meniscal tear   Seasonal allergies     Past Surgical History:  Procedure Laterality Date   COLONOSCOPY     Leg Laceration Repair  Left    Age 71   PELVIC LYMPH NODE DISSECTION Bilateral 03/22/2018   Procedure: PELVIC LYMPH NODE DISSECTION;  Surgeon: Ceasar Mons, MD;  [facility];  Service: Urology;  Laterality: Bilateral;   PROSTATE BIOPSY     ROBOT ASSISTED LAPAROSCOPIC RADICAL PROSTATECTOMY N/A 03/22/2018   Procedure: XI ROBOTIC ASSISTED LAPAROSCOPIC PROSTATECTOMY;  Surgeon: Ceasar Mons, MD;  [facility];  Service: Urology;  Laterality: N/A;   TOTAL KNEE ARTHROPLASTY Right 05/05/2019   Procedure: RIGHT TOTAL KNEE ARTHROPLASTY;  Surgeon: Frederik Pear, MD;  [facility];  Service: Orthopedics;  Laterality: Right;    There were no vitals filed for this visit.   Subjective Assessment - 07/18/21 1332     Subjective Pt entered ST room with WNL volume.    Currently in Pain? No/denies                   ADULT SLP TREATMENT - 07/18/21 1335       General Information   Behavior/Cognition  Alert;Cooperative;Pleasant mood      Treatment Provided   Treatment provided Cognitive-Linquistic      Cognitive-Linquistic Treatment   Treatment focused on Dysarthria    Skilled Treatment 15 minute conversation with pt prior to /a/ at [facility] speech volume 85% of the time. Loud /a/ completed to habitualize WNLconversational speech vollume with average low 90s dB. 15 minute simple mod ocmplex conversation afterwards average low 70s dB and appropriate breathing..      Assessment / Recommendations / Plan   Plan Continue with current plan of care      Progression Toward Goals   Progression toward goals Progressing toward goals                SLP Short Term Goals - 07/18/21 2322       SLP SHORT TERM GOAL #1   Title pt will produce /a/ with average upper 80s dB at 30 cm over 3 sessions    Baseline 06-15-21. 06-22-21    Status Partially Met    Target Date 07/01/21      SLP SHORT TERM GOAL #2   Title pt will demo abdominal breathing with sentence responses 85% accuracy x 3 sessions    Status Achieved    Target Date 07/01/21  SLP SHORT TERM GOAL #3   Title pt will generate 5 minutes simple conversation with low 70s dB average in 3 sessions    Baseline 06-28-21    Status Partially Met    Target Date 07/01/21      SLP SHORT TERM GOAL #4   Title pt will undergo insturmental swallow assessment (MBSS or FEES) if clinically indicated    Time 4    Period Weeks    Status Deferred    Target Date 07/01/21              SLP Long Term Goals - 07/18/21 1401       SLP LONG TERM GOAL #1   Title pt will use abdominal breathing >80% of the time during 10 minute simple conversation in 3 sessions    Baseline 07-18-21    Time 8    Period Weeks    Status On-going    Target Date 08/05/21      SLP LONG TERM GOAL #2   Title pt will maintain upper 60s dB average in 10 minute simple conversation x 3 sessions    Baseline 06-28-21, 07-04-21    Status Achieved    Target Date 08/05/21       SLP LONG TERM GOAL #3   Title pt will report fewer requests to repeat himself than prior to ST in 3 sessions    Time 8    Period Weeks    Status On-going    Target Date 08/05/21      SLP LONG TERM GOAL #4   Title pt will produce average upper 60s dB in 10 minutes simple to mod complex conversation in 5 sessions    Baseline 06-28-21, 07-04-21, 07-18-21    Time 8    Period Weeks    Status Revised    Target Date 08/05/21      SLP LONG TERM GOAL #5   Title pt will score better on a speech QOL questionairre than in the first 1-2 ST sessions    Time 8    Period Weeks    Status On-going    Target Date 08/05/21              Plan - 07/18/21 2321     Clinical Impression Statement [place]") presents today with 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 Today pt demonstrated more success maintaining WNL volume with conversation. SLP believes pt will cont to benefit from skilled ST targeting speech loudness and clarity. Pt may require an instrumental swallow evaluation during this therapy course and DO signature on this plan of care will be understood to mean that referral for MBSS or FEES is provided.    Speech Therapy Frequency 2x / week    Duration 8 weeks    Treatment/Interventions Aspiration precaution training;Language facilitation;Environmental controls;Cueing hierarchy;SLP instruction and feedback;Compensatory techniques;Functional tasks;Compensatory strategies;Patient/family education;Diet toleration management by SLP;Other (comment)    Potential to Achieve Goals Good    SLP Home Exercise Plan provided    Consulted and Agree with Plan of Care Patient             Patient will benefit from skilled therapeutic intervention in order to improve the following deficits and impairments:   Dysarthria and anarthria  Dysphagia, unspecified type  Aphasia  Cognitive communication deficit    Problem  List Patient Active Problem List   Diagnosis Date Noted   S/P TKR (total knee replacement), right 05/05/2019  Osteoarthritis of right knee 05/02/2019   Prostate cancer ([place]) 03/22/2018   Malignant neoplasm of prostate ([place]) 02/27/2018    Powell, Maybee 07/18/2021, 11:23 PM  [facility] [address] [place], [place], [address] Phone: [phone]   Fax:  [phone]   Name: Matthew [name] MRN: [id number] Date of Birth: 10/28/1950

## 2021-07-19 DIAGNOSIS — I10 Essential (primary) hypertension: Secondary | ICD-10-CM | POA: Diagnosis not present

## 2021-07-20 ENCOUNTER — Encounter: Payer: PPO | Admitting: Occupational Therapy

## 2021-07-20 ENCOUNTER — Ambulatory Visit: Payer: PPO | Admitting: Physical Therapy

## 2021-07-21 ENCOUNTER — Other Ambulatory Visit: Payer: Self-pay | Admitting: Neurology

## 2021-07-25 ENCOUNTER — Ambulatory Visit: Payer: PPO

## 2021-07-25 ENCOUNTER — Ambulatory Visit: Payer: PPO | Attending: Neurology | Admitting: Occupational Therapy

## 2021-07-25 ENCOUNTER — Ambulatory Visit: Payer: PPO | Admitting: Physical Therapy

## 2021-07-25 ENCOUNTER — Encounter: Payer: Self-pay | Admitting: Physical Therapy

## 2021-07-25 ENCOUNTER — Other Ambulatory Visit: Payer: Self-pay

## 2021-07-25 DIAGNOSIS — R293 Abnormal posture: Secondary | ICD-10-CM

## 2021-07-25 DIAGNOSIS — R29818 Other symptoms and signs involving the nervous system: Secondary | ICD-10-CM | POA: Insufficient documentation

## 2021-07-25 DIAGNOSIS — R278 Other lack of coordination: Secondary | ICD-10-CM | POA: Diagnosis not present

## 2021-07-25 DIAGNOSIS — R471 Dysarthria and anarthria: Secondary | ICD-10-CM

## 2021-07-25 DIAGNOSIS — R41841 Cognitive communication deficit: Secondary | ICD-10-CM | POA: Diagnosis not present

## 2021-07-25 DIAGNOSIS — M6281 Muscle weakness (generalized): Secondary | ICD-10-CM | POA: Insufficient documentation

## 2021-07-25 DIAGNOSIS — R2681 Unsteadiness on feet: Secondary | ICD-10-CM

## 2021-07-25 DIAGNOSIS — R1310 Dysphagia, unspecified: Secondary | ICD-10-CM | POA: Insufficient documentation

## 2021-07-25 DIAGNOSIS — R2689 Other abnormalities of gait and mobility: Secondary | ICD-10-CM

## 2021-07-25 DIAGNOSIS — R4701 Aphasia: Secondary | ICD-10-CM

## 2021-07-25 NOTE — Therapy (Signed)
[place] ?[facility] ?[address], [address] ?[place], [place], [address] ?Phone: [phone]   Fax:  [phone] ? ?Physical Therapy Treatment ? ?Patient Details  ?Name: Matthew [name] ?MRN: [id number] ?Date of Birth: 08/13/1950 ?Referring Provider (PT): Tat, Wells Guiles ? ? ?Encounter Date: 07/25/2021 ? ? PT End of Session - 07/25/21 1447   ? ? Visit Number 11   ? Number of Visits 16   ? Date for PT Re-Evaluation 08/29/21   ? Authorization Type [employer]   ? Progress Note Due on Visit 20   ? PT Start Time 1405   ? PT Stop Time [id number]   ? PT Time Calculation (min) 42 min   ? Equipment Utilized During Treatment Gait belt   ? Activity Tolerance Patient tolerated treatment well   ? Behavior During Therapy WFL for tasks assessed/performed;Flat affect   ? ?  ?  ? ?  ? ? ?Past Medical History:  ?Diagnosis Date  ? Anxiety   ? Arthritis   ? GERD (gastroesophageal reflux disease)   ? occ remote history  ? Hypertension   ? Parkinson disease ([place])   ? Prostate cancer ([place])   ? Right knee pain   ? questionable meniscal tear  ? Seasonal allergies   ? ? ?Past Surgical History:  ?Procedure Laterality Date  ? COLONOSCOPY    ? Leg Laceration Repair  Left   ? Age 71  ? PELVIC LYMPH NODE DISSECTION Bilateral 03/22/2018  ? Procedure: PELVIC LYMPH NODE DISSECTION;  Surgeon: Ceasar Mons, MD;  [facility]: [facility];  Service: Urology;  Laterality: Bilateral;  ? PROSTATE BIOPSY    ? ROBOT ASSISTED LAPAROSCOPIC RADICAL PROSTATECTOMY N/A 03/22/2018  ? Procedure: XI ROBOTIC ASSISTED LAPAROSCOPIC PROSTATECTOMY;  Surgeon: Ceasar Mons, MD;  [facility];  Service: Urology;  Laterality: N/A;  ? TOTAL KNEE ARTHROPLASTY Right 05/05/2019  ? Procedure: RIGHT TOTAL KNEE ARTHROPLASTY;  Surgeon: Frederik Pear, MD;  [facility];  Service: Orthopedics;  Laterality: Right;  ? ? ?There were no vitals filed for this visit. ? ? Subjective Assessment - 07/25/21 1405   ? ? Subjective Brother in law  had a heart attack this AM and things have been a bit hectic. Walking on the beach wore me out.   ? Pertinent History Shoulder pain and back pain (?arthritis); saw orthopedist to f/u wtih R knee (pain from TKR) and mild tendonitis   ? Patient Stated Goals Pt's goals for therapy are to move normally and move better.  Wife-not fall.   ? Currently in Pain? No/denies   ? ?  ?  ? ?  ? ? ? ? ? ? ? ? ? ? ? ? ? ? ? ? ? ? ? ? [facility] Adult PT Treatment/Exercise - 07/25/21 0001   ? ?  ? Neuro Re-ed   ? Neuro Re-ed Details  romberg on foam + perturbations, standing on foam + posterior perturbation eliciting backwards stepping reflex, resisted walking with blue TB forward, backwards, lateral in II bars with cues to maintain long step length and wider BOS; 4 square step with CGA CW/CCW with use of stomping to increase movement amplitude   ?  ? Knee/Hip Exercises: Aerobic  ? Nustep NuStep, Level 5, 4 extremities x 7 minutes, cues to keep steps/min>80   ?  ? Knee/Hip Exercises: Seated  ? Sit to Sand 10 reps;without UE support;2 sets   from 16 in seat  ? ?  ?  ? ?  ? ? ? ? ? ? ? ? ? ? ? ?  PT Short Term Goals - 07/18/21 1522   ? ?  ? PT SHORT TERM GOAL #1  ? Title Pt will be indepedent with Parkinson's specific HEP to address balance, transfers, gait for improved overall mobility.  TARGET 07/01/2021   ? Time 4   ? Period Weeks   ? Status Achieved   ?  ? PT SHORT TERM GOAL #2  ? Title Pt will improve 5x sit<>stand to less than or equal to 10 sec to demonstrate improved functional strength and transfer efficiency.   ? Baseline 11.66 sec   ? Time 3   ? Period Weeks   ? Status Partially Met   11 sec  ? Target Date 08/08/21   ?  ? PT SHORT TERM GOAL #3  ? Title Pt will improve TUG score to less than or equal to 13.5 sec for decreased fall risk.   ? Baseline 16.19 sec   ? Time 4   ? Period Weeks   ? Status Achieved   ?  ? PT SHORT TERM GOAL #4  ? Title Pt will perform push and release test in posterior direction in 2 steps or less for  improved balance recovery.   ? Baseline 4 steps, would fall if unaided; 2-3 steps in posterior direction and able to recover on his own   ? Time 3   ? Period Weeks   ? Status Partially Met   7 steps  ? Target Date 08/08/21   ? ?  ?  ? ?  ? ? ? ? PT Long Term Goals - 07/18/21 1522   ? ?  ? PT LONG TERM GOAL #1  ? Title Pt will be independent with progression of HEP for Parkinson's related deficits.  TARGET 07/29/2021   ? Time 6   ? Period Weeks   ? Status On-going   ? Target Date 08/29/21   ?  ? PT LONG TERM GOAL #2  ? Title Pt will perform at least 8 of 10 reps of sit<>stand from <18" surfaces, for improved transfer efficiency and safety.   ? Time 8   ? Period Weeks   ? Status Achieved   ?  ? PT LONG TERM GOAL #3  ? Title Pt will improve MiniBESTest score to at least 21/28 for decreased fall risk.   ? Baseline 14/28   ? Time 6   ? Period Weeks   ? Status On-going   19/28  ? Target Date 08/29/21   ?  ? PT LONG TERM GOAL #4  ? Title Pt will improve TUG cognitive score to less than or equal to 15 sec for improved dual tasking with gait.   ? Baseline 18.44 sec   ? Time 8   ? Period Weeks   ? Status Achieved   ?  ? PT LONG TERM GOAL #5  ? Title Pt will verbalize plans for continued community fitness upon d/c from PT.   ? Time 6   ? Period Weeks   ? Status On-going   contemplating cycling class, walking for exercise, PWR class  ? Target Date 08/29/21   ? ?  ?  ? ?  ? ? ? ? ? ? ? ? Plan - 07/25/21 1543   ? ? Clinical Impression Statement Patient arrived to session with report of experiencing some stress from a family emergency this AM. Worked on static and dynamic balance exercises today. Patient able to demonstrate reactive ankle and hip strategy to maintain balance against  perturbations. Better stability demonstrated with multidirectional stepping today with use of stomping to increase movement amplitude. Able to perform STS transfers from low surface with good effort to control eccentric phase. Patient is demonstrating  good progress towards goals.   ? Comorbidities anxiety, arthritis, GERD, HTN, R TKR   ? PT Frequency 1x / week   ? PT Duration 6 weeks   ? PT Treatment/Interventions ADLs/Self Care Home Management;Gait training;Stair training;Functional mobility training;Therapeutic activities;Therapeutic exercise;Balance training;Neuromuscular re-education;DME Instruction;Patient/family education;Manual techniques;Passive range of motion   ? PT Next Visit Plan Check HEP additions, continue to work on balance recovery, large amplitude movement patterns, varied surfaces and gait training; reactive balance   ? Consulted and Agree with Plan of Care Patient   ? ?  ?  ? ?  ? ? ?Patient will benefit from skilled therapeutic intervention in order to improve the following deficits and impairments:  Abnormal gait, Difficulty walking, Decreased balance, Impaired flexibility, Postural dysfunction, Decreased strength, Decreased mobility ? ?Visit Diagnosis: ?Muscle weakness (generalized) ? ?Other symptoms and signs involving the nervous system ? ?Abnormal posture ? ?Unsteadiness on feet ? ?Other abnormalities of gait and mobility ? ? ? ? ?Problem List ?Patient Active Problem List  ? Diagnosis Date Noted  ? S/P TKR (total knee replacement), right 05/05/2019  ? Osteoarthritis of right knee 05/02/2019  ? Prostate cancer ([place]) 03/22/2018  ? Malignant neoplasm of prostate ([place]) 02/27/2018  ? ? ?Janene Harvey, PT, DPT ?07/25/21 3:45 PM ? ? ?[place] ?[facility] ?[address], [address] ?[place], [place], [address] ?Phone: [phone]   Fax:  [phone] ? ?Name: Refujio Haymer ?MRN: [id number] ?Date of Birth: 1951-03-17 ? ? ? ?

## 2021-07-25 NOTE — Therapy (Signed)
[facility] [address] [place], [place], [address] Phone: [phone]   Fax:  [phone]  Occupational Therapy Treatment and Discharge  Patient Details  Name: Matthew [name] MRN: [id number] Date of Birth: May 03, 1951 Referring Provider (OT): Tat, Eustace Quail, DO  OCCUPATIONAL THERAPY DISCHARGE SUMMARY  Visits from Start of Care: 12  Current functional level related to goals / functional outcomes: Pt has made great progress towards LTGs, meeting 5 of 5.  Pt has demonstrated significant improvements with donning/doffing jacket, fastening/unfastening buttons, and demonstrating large amplitude movements overall during functional tasks.  Pt is attending community dance classes and drumming classes and is knowledgeable about other resources both locally and virtually.   Remaining deficits: Continues to have stooped posture and small movements.   Education / Equipment: Pt has large amplitude hand and wrist exercises.  Pt is aware of community and virtual PD resources.     Patient agrees to discharge. Patient goals were met. Patient is being discharged due to meeting the stated rehab goals..      Encounter Date: 07/25/2021   OT End of Session - 07/25/21 1620     Visit Number 12    Number of Visits 17    Date for OT Re-Evaluation 07/29/21    Authorization Type [employer]    OT Start Time 1450    OT Stop Time 1531    OT Time Calculation (min) 41 min    Activity Tolerance Patient tolerated treatment well    Behavior During Therapy WFL for tasks assessed/performed;Flat affect             Past Medical History:  Diagnosis Date   Anxiety    Arthritis    GERD (gastroesophageal reflux disease)    occ remote history   Hypertension    Parkinson disease ([place])    Prostate cancer ([place])    Right knee pain    questionable meniscal tear   Seasonal allergies     Past Surgical History:  Procedure Laterality Date    COLONOSCOPY     Leg Laceration Repair  Left    Age 71   PELVIC LYMPH NODE DISSECTION Bilateral 03/22/2018   Procedure: PELVIC LYMPH NODE DISSECTION;  Surgeon: Ceasar Mons, MD;  [facility];  Service: Urology;  Laterality: Bilateral;   PROSTATE BIOPSY     ROBOT ASSISTED LAPAROSCOPIC RADICAL PROSTATECTOMY N/A 03/22/2018   Procedure: XI ROBOTIC ASSISTED LAPAROSCOPIC PROSTATECTOMY;  Surgeon: Ceasar Mons, MD;  [facility];  Service: Urology;  Laterality: N/A;   TOTAL KNEE ARTHROPLASTY Right 05/05/2019   Procedure: RIGHT TOTAL KNEE ARTHROPLASTY;  Surgeon: Frederik Pear, MD;  [facility];  Service: Orthopedics;  Laterality: Right;    There were no vitals filed for this visit.   Subjective Assessment - 07/25/21 1453     Subjective  Pt reports having a "pretty hectic day".    Patient is accompanied by: Family member    Pertinent History anxiety, arthritis, GERD, HTN, R TKR    Patient Stated Goals to be able to function better                [facility] OT Assessment - 07/25/21 1622       Observation/Other Assessments   Simulated Eating Time (seconds) 13.5   20.81 on eval   Donning Doffing Jacket Time (seconds) 30.9   >2 min on eval and 1:36.37 on 06/15/21   Donning Doffing Jacket Comments 3 button/unbutton: 1:14.13 (4:18 on eval)  Coordination   Right 9 Hole Peg Test 39.53   46.28 on eval                     OT Treatments/Exercises (OP) - 07/25/21 0001       ADLs   UB Dressing Pt completed donning/doffing jacket with large amplitude "cape movement".  Pt also demonstrated carryover of education with buttoning and unbuttoning 3 buttons.    ADL Comments Discussed progress towards goals and reiterated importance of large amplitude movements and hand flicks prior to [facility] to increase success with functional tasks.  Pt able to report strategies to prevent future PD related complications and community resources.  Listing things that he is  engaged in and other information that he knows is available.                      OT Short Term Goals - 07/25/21 1522       OT SHORT TERM GOAL #1   Title Pt will be Independent with PD specific HEP    Time 4    Period Weeks    Status Achieved    Target Date 07/01/21      OT SHORT TERM GOAL #2   Title Pt will verbalize understanding of adapted strategies to maximize safety and I with ADLs/ IADLs .    Time 4    Period Weeks    Status Achieved      OT SHORT TERM GOAL #3   Title Pt will retrieve a lightweight object from overhead shelf at 130 shoulder flexion and -5 elbow extension with left and right UE's    Baseline R: shoulder 124 and elbow -16  L: shoulder 124 and elbow - 8    Time 4    Period Weeks    Status Achieved      OT SHORT TERM GOAL #4   Title Pt will demonstrate increased ease with dressing as evidenced by decreasing PPT#4(don/ doff jacket) by 30 seconds   30.9 sec   Baseline > 2 mins.  06/15/21 1:36.37    Time 4    Period Weeks    Status On-going               OT Long Term Goals - 07/25/21 1459       OT LONG TERM GOAL #1   Title Pt will verbalize understanding of ways to prevent future PD related complications and PD community resources following review    Time 8    Period Weeks    Status Achieved    Target Date 07/29/21      OT LONG TERM GOAL #2   Title Pt will demonstrate improved fine motor coordination for ADLs as evidenced by decreasing 9 hole peg test score for RUE by 3 secs   R: 39.53 on 07/25/21   Baseline R: 46.28 and L: 46.75    Time 8    Period Weeks    Status Achieved      OT LONG TERM GOAL #3   Title Pt will demonstrate improved ease with dressing as eveidenced by decreasing 3 button/ unbutton time to 2 min or less   1:14.13 on 07/25/21   Baseline 4:18.1   2:29.06   Time 8    Period Weeks    Status Achieved      OT LONG TERM GOAL #4   Title Pt will report pain in right shoulder no greater than 2/10 for ADLs  Time 8     Period Weeks    Status Achieved      OT LONG TERM GOAL #5   Title Pt will demonstrate improved ease with feeding as evidenced by decreasing PPT#2 (self feeding) by 3 secs   13.5 seconds   Baseline 20.81    Time 8    Period Weeks    Status Achieved                   Plan - 07/25/21 1620     Clinical Impression Statement Pt has made tremendous progress towards goals, demonstrating improved usage of strategies and big amplitude movements during self-care tasks and functional tasks.  Pt utilizes hand flicks prior to [facility] tasks, yielding improvements in function.    OT Occupational Profile and History Detailed Assessment- Review of Records and additional review of physical, cognitive, psychosocial history related to current functional performance    Occupational performance deficits (Please refer to evaluation for details): ADL's;IADL's;Leisure    Body Structure / Function / Physical Skills ADL;Balance;Body mechanics;Coordination;Endurance;Flexibility;FMC;GMC;IADL;Mobility;Pain;ROM;Sensation;Strength;Tone;UE functional use    Rehab Potential Fair    Clinical Decision Making Limited treatment options, no task modification necessary    Comorbidities Affecting Occupational Performance: May have comorbidities impacting occupational performance    Modification or Assistance to Complete Evaluation  No modification of tasks or assist necessary to complete eval    OT Frequency 2x / week    OT Duration 8 weeks    OT Treatment/Interventions Self-care/ADL training;Cryotherapy;Electrical Stimulation;Moist Heat;Ultrasound;Iontophoresis;Therapeutic exercise;Neuromuscular education;Energy conservation;DME and/or AE instruction;Functional Mobility Training;Manual Therapy;Passive range of motion;Therapeutic activities;Cognitive remediation/compensation;Patient/family education;Balance training;Psychosocial skills training    Plan D/C from OT    Consulted and Agree with Plan of Care Patient              Patient will benefit from skilled therapeutic intervention in order to improve the following deficits and impairments:   Body Structure / Function / Physical Skills: ADL, Balance, Body mechanics, Coordination, Endurance, Flexibility, FMC, GMC, IADL, Mobility, Pain, ROM, Sensation, Strength, Tone, UE functional use       Visit Diagnosis: Muscle weakness (generalized)  Other lack of coordination  Other symptoms and signs involving the nervous system  Abnormal posture  Unsteadiness on feet    Problem List Patient Active Problem List   Diagnosis Date Noted   S/P TKR (total knee replacement), right 05/05/2019   Osteoarthritis of right knee 05/02/2019   Prostate cancer ([place]) 03/22/2018   Malignant neoplasm of prostate ([place]) 02/27/2018    Simonne Come, OT 07/25/2021, 4:42 PM  [facility] [address] [place], [place], [address] Phone: [phone]   Fax:  [phone]  Name: Matthew [name] MRN: [id number] Date of Birth: 03/10/1951

## 2021-07-25 NOTE — Therapy (Signed)
[place] ?[facility] ?[address], [address] ?[place], [place], [address] ?Phone: [phone]   Fax:  [phone] ? ?Speech Language Pathology Treatment ? ?Patient Details  ?Name: Matthew [name] ?MRN: [id number] ?Date of Birth: [birth year] ?Referring Provider (SLP): Tat, Wells Guiles, DO ? ? ?Encounter Date: 07/25/2021 ? ? End of Session - 07/25/21 1625   ? ? Visit Number 8   ? Number of Visits 17   ? Date for SLP Re-Evaluation 08/05/21   ? SLP Start Time [id number]   ? SLP Stop Time  1615   ? SLP Time Calculation (min) 43 min   ? Activity Tolerance Patient tolerated treatment well   ? ?  ?  ? ?  ? ? ?Past Medical History:  ?Diagnosis Date  ? Anxiety   ? Arthritis   ? GERD (gastroesophageal reflux disease)   ? occ remote history  ? Hypertension   ? Parkinson disease ([place])   ? Prostate cancer ([place])   ? Right knee pain   ? questionable meniscal tear  ? Seasonal allergies   ? ? ?Past Surgical History:  ?Procedure Laterality Date  ? COLONOSCOPY    ? Leg Laceration Repair  Left   ? Age 36  ? PELVIC LYMPH NODE DISSECTION Bilateral 03/22/2018  ? Procedure: PELVIC LYMPH NODE DISSECTION;  Surgeon: Ceasar Mons, MD;  [facility];  Service: Urology;  Laterality: Bilateral;  ? PROSTATE BIOPSY    ? ROBOT ASSISTED LAPAROSCOPIC RADICAL PROSTATECTOMY N/A 03/22/2018  ? Procedure: XI ROBOTIC ASSISTED LAPAROSCOPIC PROSTATECTOMY;  Surgeon: Ceasar Mons, MD;  [facility];  Service: Urology;  Laterality: N/A;  ? TOTAL KNEE ARTHROPLASTY Right 05/05/2019  ? Procedure: RIGHT TOTAL KNEE ARTHROPLASTY;  Surgeon: Frederik Pear, MD;  [facility];  Service: Orthopedics;  Laterality: Right;  ? ? ?There were no vitals filed for this visit. ? ? Subjective Assessment - 07/25/21 1601   ? ? Subjective Pt entered ST room with WNL volume.   ? Currently in Pain? No/denies   ? ?  ?  ? ?  ? ? ? ? ? ? ? ? ADULT SLP TREATMENT - 07/25/21 1602   ? ?  ? General Information  ? Behavior/Cognition  Alert;Cooperative;Pleasant mood   ?  ? Treatment Provided  ? Treatment provided Cognitive-Linquistic   ?  ? Cognitive-Linquistic Treatment  ? Treatment focused on Dysarthria   ? Skilled Treatment 15 minute mod complex conversation with pt prior to /a/ at low 70s dB average. Loud /a/ completed to habitualize WNL conversational speech vollume with average low-mid 90s dB, everyday sentences were average low 80s dB. 12-minute simple mod ocmplex conversation afterwards average low 70s dB for first 8 minutes and then slowly decr'd in volume decreasing slowly to mid 60s dB despite SLP min cues for volume.   ?  ? Assessment / Recommendations / Plan  ? Plan Continue with current plan of care   ?  ? Progression Toward Goals  ? Progression toward goals Progressing toward goals   ? ?  ?  ? ?  ? ? ? ? ? SLP Short Term Goals - 07/18/21 2322   ? ?  ? SLP SHORT TERM GOAL #1  ? Title pt will produce /a/ with average upper 80s dB at 30 cm over 3 sessions   ? Baseline 06-15-21. 06-22-21   ? Status Partially Met   ? Target Date 07/01/21   ?  ? SLP SHORT TERM GOAL #2  ?  Title pt will demo abdominal breathing with sentence responses 85% accuracy x 3 sessions   ? Status Achieved   ? Target Date 07/01/21   ?  ? SLP SHORT TERM GOAL #3  ? Title pt will generate 5 minutes simple conversation with low 70s dB average in 3 sessions   ? Baseline 06-28-21   ? Status Partially Met   ? Target Date 07/01/21   ?  ? SLP SHORT TERM GOAL #4  ? Title pt will undergo insturmental swallow assessment (MBSS or FEES) if clinically indicated   ? Time 4   ? Period Weeks   ? Status Deferred   ? Target Date 07/01/21   ? ?  ?  ? ?  ? ? ? SLP Long Term Goals - 07/25/21 1626   ? ?  ? SLP LONG TERM GOAL #1  ? Title pt will use abdominal breathing >80% of the time during 10 minute simple conversation in 3 sessions   ? Baseline 07-18-21, 07-25-21   ? Time 8   ? Period Weeks   ? Status On-going   ? Target Date 08/05/21   ?  ? SLP LONG TERM GOAL #2  ? Title pt will maintain upper  60s dB average in 10 minute simple conversation x 3 sessions   ? Baseline 06-28-21, 07-04-21   ? Status Achieved   ?  ? SLP LONG TERM GOAL #3  ? Title pt will report fewer requests to repeat himself than prior to ST in 3 sessions   ? Time 8   ? Period Weeks   ? Status On-going   ? Target Date 08/05/21   ?  ? SLP LONG TERM GOAL #4  ? Title pt will produce average upper 60s dB in 10 minutes simple to mod complex conversation in 5 sessions   ? Baseline 06-28-21, 07-04-21, 07-18-21, 07-25-21   ? Time 8   ? Period Weeks   ? Status Revised   ? Target Date 08/05/21   ?  ? SLP LONG TERM GOAL #5  ? Title pt will score better on a speech QOL questionairre than in the first 1-2 ST sessions   ? Time 8   ? Period Weeks   ? Status On-going   ? ?  ?  ? ?  ? ? ? Plan - 07/25/21 1625   ? ? 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 Today pt demonstrated more success maintaining WNL volume with conversation when conversation was approx 10-12 minutes in length. SLP believes pt will cont to benefit from skilled ST targeting speech loudness and clarity. Pt may require an instrumental swallow evaluation during this therapy course and DO signature on this plan of care will be understood to mean that referral for MBSS or FEES is provided.   ? Speech Therapy Frequency 2x / week   ? Duration 8 weeks   ? Treatment/Interventions Aspiration precaution training;Language facilitation;Environmental controls;Cueing hierarchy;SLP instruction and feedback;Compensatory techniques;Functional tasks;Compensatory strategies;Patient/family education;Diet toleration management by SLP;Other (comment)   ? Potential to Achieve Goals Good   ? SLP Home Exercise Plan provided   ? Consulted and Agree with Plan of Care Patient   ? ?  ?  ? ?  ? ? ?Patient will benefit from skilled therapeutic intervention in order to improve the following  deficits and impairments:   ?Dysarthria and anarthria ? ?Dysphagia, unspecified type ? ?  Aphasia ? ?Cognitive communication deficit ? ? ? ?Problem List ?Patient Active Problem List  ? Diagnosis Date Noted  ? S/P TKR (total knee replacement), right 05/05/2019  ? Osteoarthritis of right knee 05/02/2019  ? Prostate cancer ([place]) 03/22/2018  ? Malignant neoplasm of prostate ([place]) 02/27/2018  ? ? ?Bruchy Mikel, CCC-SLP ?07/25/2021, 4:27 PM ? ?[place] ?[facility] ?[address], [address] ?[place], [place], [address] ?Phone: [phone]   Fax:  [phone] ? ? ?Name: Matthew [name] ?MRN: [id number] ?Date of Birth: Jan 10, 1951 ? ?

## 2021-07-26 ENCOUNTER — Other Ambulatory Visit: Payer: Self-pay | Admitting: Neurology

## 2021-07-26 ENCOUNTER — Other Ambulatory Visit: Payer: Self-pay

## 2021-08-03 ENCOUNTER — Ambulatory Visit: Payer: PPO | Admitting: Physical Therapy

## 2021-08-03 ENCOUNTER — Encounter: Payer: Self-pay | Admitting: Physical Therapy

## 2021-08-03 ENCOUNTER — Other Ambulatory Visit: Payer: Self-pay

## 2021-08-03 DIAGNOSIS — R293 Abnormal posture: Secondary | ICD-10-CM

## 2021-08-03 DIAGNOSIS — R2689 Other abnormalities of gait and mobility: Secondary | ICD-10-CM

## 2021-08-03 DIAGNOSIS — M6281 Muscle weakness (generalized): Secondary | ICD-10-CM | POA: Diagnosis not present

## 2021-08-03 DIAGNOSIS — R2681 Unsteadiness on feet: Secondary | ICD-10-CM

## 2021-08-03 NOTE — Therapy (Signed)
[facility] [address] [place], [place], [address] Phone: [phone]   Fax:  [phone]  Physical Therapy Treatment  Patient Details  Name: Matthew [name] MRN: [id number] Date of Birth: 1950/10/22 Referring Provider (PT): Tat, Lurena Joiner   Encounter Date: 08/03/2021   PT End of Session - 08/03/21 1327     Visit Number 12    Number of Visits 16    Date for PT Re-Evaluation 08/29/21    Authorization Type [employer]    Progress Note Due on Visit 20    PT Start Time 1320    PT Stop Time 1400    PT Time Calculation (min) 40 min    Equipment Utilized During Treatment Gait belt    Activity Tolerance Patient tolerated treatment well    Behavior During Therapy WFL for tasks assessed/performed;Flat affect             Past Medical History:  Diagnosis Date   Anxiety    Arthritis    GERD (gastroesophageal reflux disease)    occ remote history   Hypertension    Parkinson disease (HCC)    Prostate cancer (HCC)    Right knee pain    questionable meniscal tear   Seasonal allergies     Past Surgical History:  Procedure Laterality Date   COLONOSCOPY     Leg Laceration Repair  Left    Age 71   PELVIC LYMPH NODE DISSECTION Bilateral 03/22/2018   Procedure: PELVIC LYMPH NODE DISSECTION;  Surgeon: Rene Paci, MD;  Location: [facility];  Service: Urology;  Laterality: Bilateral;   PROSTATE BIOPSY     ROBOT ASSISTED LAPAROSCOPIC RADICAL PROSTATECTOMY N/A 03/22/2018   Procedure: XI ROBOTIC ASSISTED LAPAROSCOPIC PROSTATECTOMY;  Surgeon: Rene Paci, MD;  Location: [facility];  Service: Urology;  Laterality: N/A;   TOTAL KNEE ARTHROPLASTY Right 05/05/2019   Procedure: RIGHT TOTAL KNEE ARTHROPLASTY;  Surgeon: Gean Birchwood, MD;  Location: [facility];  Service: Orthopedics;  Laterality: Right;    There were no vitals filed for this visit.   Subjective Assessment - 08/03/21 1322     Subjective Have been more  unsteady recently; had a few incidents in the past few weeks.  One backwards fall trying to pull chair in bathroom, almost fell back while trying to get into son's jeep on an inline.  Wife has been gone and haven't slept well.    Pertinent History Shoulder pain and back pain (?arthritis); saw orthopedist to f/u wtih R knee (pain from TKR) and mild tendonitis    Patient Stated Goals Pt's goals for therapy are to move normally and move better.  Wife-not fall.    Currently in Pain? No/denies                               [facility] Adult PT Treatment/Exercise - 08/03/21 0001       Self-Care   Self-Care Other Self-Care Comments    Other Self-Care Comments  Provided information on and discussed benefits of community PWR! Moves exercise classes to reinforce movement patterns taught in therapy sessions.      Knee/Hip Exercises: Aerobic   Nustep NuStep, Level 5, 4 extremities x 7 minutes, cues to keep steps/min>80.  Cues to fully extend knees.  Two 15-sec bouts as hard/fast as he can (>90)                 Balance Exercises -  08/03/21 0001       Balance Exercises: Standing   Rockerboard Anterior/posterior;10 reps;Limitations    Rockerboard Limitations UE support, hip and ankle strategy work    Retro Gait 5 reps;Limitations    Retro Gait Limitations Forward/back walking in parallel bars, cues for posture, step length.  Varied number of stepping, forward/back directions.  For posterior direction, cues for widened BOS and hinge at hips for deliberate backward stepping.    Other Standing Exercises STanding on incline, balance perturbations given at hips in posterior direction, to activate hip extensor and plantarflexor musculature.    Other Standing Exercises Comments Standing on incline:  marching in place x 5 reps, then widen BOS and PWR! UP; repeat x 4 sets.  Standing on incline:  forward alternating step taps x 10, back alternating step taps x 5, 2 sets; cues for widened BOS  and PWR! Up posture between sets.                PT Education - 08/03/21 1415     Education Details PWR! Moves classes-[facility]    Person(s) Educated Patient    Methods Explanation;Handout    Comprehension Verbalized understanding              PT Short Term Goals - 07/18/21 1522       PT SHORT TERM GOAL #1   Title Pt will be indepedent with Parkinson's specific HEP to address balance, transfers, gait for improved overall mobility.  TARGET 07/01/2021    Time 4    Period Weeks    Status Achieved      PT SHORT TERM GOAL #2   Title Pt will improve 5x sit<>stand to less than or equal to 10 sec to demonstrate improved functional strength and transfer efficiency.    Baseline 11.66 sec    Time 3    Period Weeks    Status Partially Met   11 sec   Target Date 08/08/21      PT SHORT TERM GOAL #3   Title Pt will improve TUG score to less than or equal to 13.5 sec for decreased fall risk.    Baseline 16.19 sec    Time 4    Period Weeks    Status Achieved      PT SHORT TERM GOAL #4   Title Pt will perform push and release test in posterior direction in 2 steps or less for improved balance recovery.    Baseline 4 steps, would fall if unaided; 2-3 steps in posterior direction and able to recover on his own    Time 3    Period Weeks    Status Partially Met   7 steps   Target Date 08/08/21               PT Long Term Goals - 07/18/21 1522       PT LONG TERM GOAL #1   Title Pt will be independent with progression of HEP for Parkinson's related deficits.  TARGET 07/29/2021    Time 6    Period Weeks    Status On-going    Target Date 08/29/21      PT LONG TERM GOAL #2   Title Pt will perform at least 8 of 10 reps of sit<>stand from <18" surfaces, for improved transfer efficiency and safety.    Time 8    Period Weeks    Status Achieved      PT LONG TERM GOAL #3   Title Pt will improve  MiniBESTest score to at least 21/28 for decreased fall risk.    Baseline 14/28     Time 6    Period Weeks    Status On-going   19/28   Target Date 08/29/21      PT LONG TERM GOAL #4   Title Pt will improve TUG cognitive score to less than or equal to 15 sec for improved dual tasking with gait.    Baseline 18.44 sec    Time 8    Period Weeks    Status Achieved      PT LONG TERM GOAL #5   Title Pt will verbalize plans for continued community fitness upon d/c from PT.    Time 6    Period Weeks    Status On-going   contemplating cycling class, walking for exercise, PWR class   Target Date 08/29/21                   Plan - 08/03/21 1416     Clinical Impression Statement Pt arrives to session, with reports of one fall and several additional posterior loss of balance in the past week.  Of note, he is experiencing family stressors with wife haveing been out of town for family medical emergency, not sleeping well, and time change.  Skilled PT session focused today on balance and balance recovery in posterior direction, with pt responding well to cues for hinge at hips and slowed pace for deliberate foot placement in posterior direction.  Also educated pt in community PWR! Moves classes, as therapist feels that pt will benefit from social interaction and weekly reinforcement in PWR! Moves for optimal movement patterns.  He will continue to benefit from skilled PT towards improved overall functional mobility and decreased fall risk.    Comorbidities anxiety, arthritis, GERD, HTN, R TKR    PT Frequency 1x / week    PT Duration 6 weeks    PT Treatment/Interventions ADLs/Self Care Home Management;Gait training;Stair training;Functional mobility training;Therapeutic activities;Therapeutic exercise;Balance training;Neuromuscular re-education;DME Instruction;Patient/family education;Manual techniques;Passive range of motion    PT Next Visit Plan Continue to work on balance recovery, especially in posterior directions; large amplitude movement patterns, varied surfaces and  gait training; reactive balance challenges.  Ask about PWR! Moves class    Consulted and Agree with Plan of Care Patient             Patient will benefit from skilled therapeutic intervention in order to improve the following deficits and impairments:  Abnormal gait, Difficulty walking, Decreased balance, Impaired flexibility, Postural dysfunction, Decreased strength, Decreased mobility  Visit Diagnosis: Unsteadiness on feet  Abnormal posture  Other abnormalities of gait and mobility  Muscle weakness (generalized)     Problem List Patient Active Problem List   Diagnosis Date Noted   S/P TKR (total knee replacement), right 05/05/2019   Osteoarthritis of right knee 05/02/2019   Prostate cancer (HCC) 03/22/2018   Malignant neoplasm of prostate (HCC) 02/27/2018    Matthew Reece W., PT 08/03/2021, 2:20 PM  [facility] [address] [place], [place], [address] Phone: [phone]   Fax:  [phone]  Name: Matthew [name] MRN: [id number] Date of Birth: 07/20/1950

## 2021-08-10 ENCOUNTER — Ambulatory Visit: Payer: PPO | Admitting: Physical Therapy

## 2021-08-10 ENCOUNTER — Encounter: Payer: Self-pay | Admitting: Physical Therapy

## 2021-08-10 ENCOUNTER — Other Ambulatory Visit: Payer: Self-pay

## 2021-08-10 DIAGNOSIS — R2681 Unsteadiness on feet: Secondary | ICD-10-CM

## 2021-08-10 DIAGNOSIS — R293 Abnormal posture: Secondary | ICD-10-CM

## 2021-08-10 DIAGNOSIS — R2689 Other abnormalities of gait and mobility: Secondary | ICD-10-CM

## 2021-08-10 DIAGNOSIS — M6281 Muscle weakness (generalized): Secondary | ICD-10-CM | POA: Diagnosis not present

## 2021-08-10 NOTE — Therapy (Signed)
[facility] ?[address], [address] ?[place], [place], [address] ?Phone: [phone]   Fax:  [phone] ? ?Physical Therapy Treatment ? ?Patient Details  ?Name: Matthew [name] ?MRN: [id number] ?Date of Birth: 11-01-1950 ?Referring Provider (PT): Tat, Wells Guiles ? ? ?Encounter Date: 08/10/2021 ? ? PT End of Session - 08/10/21 1402   ? ? Visit Number 13   ? Number of Visits 16   ? Date for PT Re-Evaluation 08/29/21   ? Authorization Type [employer]   ? Progress Note Due on Visit 20   ? PT Start Time 2800   ? PT Stop Time 1358   ? PT Time Calculation (min) 41 min   ? Equipment Utilized During Treatment Gait belt   ? Activity Tolerance Patient tolerated treatment well   ? Behavior During Therapy WFL for tasks assessed/performed;Flat affect   ? ?  ?  ? ?  ? ? ?Past Medical History:  ?Diagnosis Date  ? Anxiety   ? Arthritis   ? GERD (gastroesophageal reflux disease)   ? occ remote history  ? Hypertension   ? Parkinson disease ([place])   ? Prostate cancer ([place])   ? Right knee pain   ? questionable meniscal tear  ? Seasonal allergies   ? ? ?Past Surgical History:  ?Procedure Laterality Date  ? COLONOSCOPY    ? Leg Laceration Repair  Left   ? Age 26  ? PELVIC LYMPH NODE DISSECTION Bilateral 03/22/2018  ? Procedure: PELVIC LYMPH NODE DISSECTION;  Surgeon: Ceasar Mons, MD;  Location: [facility];  Service: Urology;  Laterality: Bilateral;  ? PROSTATE BIOPSY    ? ROBOT ASSISTED LAPAROSCOPIC RADICAL PROSTATECTOMY N/A 03/22/2018  ? Procedure: XI ROBOTIC ASSISTED LAPAROSCOPIC PROSTATECTOMY;  Surgeon: Ceasar Mons, MD;  Location: [facility];  Service: Urology;  Laterality: N/A;  ? TOTAL KNEE ARTHROPLASTY Right 05/05/2019  ? Procedure: RIGHT TOTAL KNEE ARTHROPLASTY;  Surgeon: Frederik Pear, MD;  Location: [facility];  Service: Orthopedics;  Laterality: Right;  ? ? ?There were no vitals filed for this visit. ? ? Subjective Assessment - 08/10/21 1321   ? ? Subjective Had a good time  at the Parkinon's class. Denies falls.   ? Pertinent History Shoulder pain and back pain (?arthritis); saw orthopedist to f/u wtih R knee (pain from TKR) and mild tendonitis   ? Patient Stated Goals Pt's goals for therapy are to move normally and move better.  Wife-not fall.   ? Currently in Pain? No/denies   ? ?  ?  ? ?  ? ? ? ? ? ? ? ? ? ? ? ? ? ? ? ? ? ? ? ? [facility] Adult PT Treatment/Exercise - 08/10/21 0001   ? ?  ? Neuro Re-ed   ? Neuro Re-ed Details  resisted forward and backward step with doubled up blue TB + recovery step in II bars; anterior/posterior weight shifts with/without arm swing in front of mirror, B arm reach + twist and clap 10x each; 4 square step with CGA with cues for safe foot placement   ?  ? Knee/Hip Exercises: Aerobic  ? Nustep NuStep, Level 5, 4 extremities x 7 minutes, cues to keep steps/min>80   ?  ? Knee/Hip Exercises: Standing  ? Other Standing Knee Exercises R/L quick step backs from 6" step 2x10 each   more difficulty on R LE  ? ?  ?  ? ?  ? ? ? ? ? ? ? ? ? ? ? ?  PT Short Term Goals - 07/18/21 1522   ? ?  ? PT SHORT TERM GOAL #1  ? Title Pt will be indepedent with Parkinson's specific HEP to address balance, transfers, gait for improved overall mobility.  TARGET 07/01/2021   ? Time 4   ? Period Weeks   ? Status Achieved   ?  ? PT SHORT TERM GOAL #2  ? Title Pt will improve 5x sit<>stand to less than or equal to 10 sec to demonstrate improved functional strength and transfer efficiency.   ? Baseline 11.66 sec   ? Time 3   ? Period Weeks   ? Status Partially Met   11 sec  ? Target Date 08/08/21   ?  ? PT SHORT TERM GOAL #3  ? Title Pt will improve TUG score to less than or equal to 13.5 sec for decreased fall risk.   ? Baseline 16.19 sec   ? Time 4   ? Period Weeks   ? Status Achieved   ?  ? PT SHORT TERM GOAL #4  ? Title Pt will perform push and release test in posterior direction in 2 steps or less for improved balance recovery.   ? Baseline 4 steps, would fall if unaided; 2-3 steps in  posterior direction and able to recover on his own   ? Time 3   ? Period Weeks   ? Status Partially Met   7 steps  ? Target Date 08/08/21   ? ?  ?  ? ?  ? ? ? ? PT Long Term Goals - 07/18/21 1522   ? ?  ? PT LONG TERM GOAL #1  ? Title Pt will be independent with progression of HEP for Parkinson's related deficits.  TARGET 07/29/2021   ? Time 6   ? Period Weeks   ? Status On-going   ? Target Date 08/29/21   ?  ? PT LONG TERM GOAL #2  ? Title Pt will perform at least 8 of 10 reps of sit<>stand from <18" surfaces, for improved transfer efficiency and safety.   ? Time 8   ? Period Weeks   ? Status Achieved   ?  ? PT LONG TERM GOAL #3  ? Title Pt will improve MiniBESTest score to at least 21/28 for decreased fall risk.   ? Baseline 14/28   ? Time 6   ? Period Weeks   ? Status On-going   19/28  ? Target Date 08/29/21   ?  ? PT LONG TERM GOAL #4  ? Title Pt will improve TUG cognitive score to less than or equal to 15 sec for improved dual tasking with gait.   ? Baseline 18.44 sec   ? Time 8   ? Period Weeks   ? Status Achieved   ?  ? PT LONG TERM GOAL #5  ? Title Pt will verbalize plans for continued community fitness upon d/c from PT.   ? Time 6   ? Period Weeks   ? Status On-going   contemplating cycling class, walking for exercise, PWR class  ? Target Date 08/29/21   ? ?  ?  ? ?  ? ? ? ? ? ? ? ? Plan - 08/10/21 1403   ? ? Clinical Impression Statement Patient arrived to session without new complaints. Worked on reactive balance in the backwards direction with patient requiring cueing to increase step length but able to perform reactive stepping with 1 anterior or posterior step. Speed work  consisted of quick backwards stepbacks with more difficulty on R LE d/t baseline weakness d/t hx of R TKA. High amplitude movements in front of mirror were performed safely but required cues to improve upright posture and amplitude of movement. Patient with improved ability to perform safe multidirectional stepping today. Patient  tolerated session well and without complaints at end of session.   ? Comorbidities anxiety, arthritis, GERD, HTN, R TKR   ? PT Frequency 1x / week   ? PT Duration 6 weeks   ? PT Treatment/Interventions ADLs/Self Care Home Management;Gait training;Stair training;Functional mobility training;Therapeutic activities;Therapeutic exercise;Balance training;Neuromuscular re-education;DME Instruction;Patient/family education;Manual techniques;Passive range of motion   ? PT Next Visit Plan Continue to work on balance recovery, especially in posterior directions; large amplitude movement patterns, varied surfaces and gait training; reactive balance challenges.  Ask about PWR! Moves class   ? Consulted and Agree with Plan of Care Patient   ? ?  ?  ? ?  ? ? ?Patient will benefit from skilled therapeutic intervention in order to improve the following deficits and impairments:  Abnormal gait, Difficulty walking, Decreased balance, Impaired flexibility, Postural dysfunction, Decreased strength, Decreased mobility ? ?Visit Diagnosis: ?Unsteadiness on feet ? ?Abnormal posture ? ?Other abnormalities of gait and mobility ? ? ? ? ?Problem List ?Patient Active Problem List  ? Diagnosis Date Noted  ? S/P TKR (total knee replacement), right 05/05/2019  ? Osteoarthritis of right knee 05/02/2019  ? Prostate cancer ([place]) 03/22/2018  ? Malignant neoplasm of prostate ([place]) 02/27/2018  ? ? ? ?Janene Harvey, PT, DPT ?08/10/21 2:04 PM ? ? ?[place] [facility] ?[address], [address] ?[place], [place], [address] ?Phone: [phone]   Fax:  [phone] ? ?Name: Matthew [name] ?MRN: [id number] ?Date of Birth: [birth year] ? ? ? ?

## 2021-08-12 DIAGNOSIS — E7800 Pure hypercholesterolemia, unspecified: Secondary | ICD-10-CM | POA: Diagnosis not present

## 2021-08-12 DIAGNOSIS — I10 Essential (primary) hypertension: Secondary | ICD-10-CM | POA: Diagnosis not present

## 2021-08-12 DIAGNOSIS — G20 Parkinson's disease: Secondary | ICD-10-CM | POA: Diagnosis not present

## 2021-08-19 ENCOUNTER — Encounter: Payer: Self-pay | Admitting: Physical Therapy

## 2021-08-19 ENCOUNTER — Ambulatory Visit: Payer: PPO | Admitting: Physical Therapy

## 2021-08-19 DIAGNOSIS — R2689 Other abnormalities of gait and mobility: Secondary | ICD-10-CM

## 2021-08-19 DIAGNOSIS — M6281 Muscle weakness (generalized): Secondary | ICD-10-CM | POA: Diagnosis not present

## 2021-08-19 DIAGNOSIS — R2681 Unsteadiness on feet: Secondary | ICD-10-CM

## 2021-08-19 DIAGNOSIS — R293 Abnormal posture: Secondary | ICD-10-CM

## 2021-08-19 NOTE — Therapy (Signed)
[facility] [address] [place], [place], [address] Phone: [phone]   Fax:  [phone]  Physical Therapy Treatment  Patient Details  Name: Matthew [name] MRN: [id number] Date of Birth: 15-May-1951 Referring Provider (PT): Tat, Lurena Joiner   Encounter Date: 08/19/2021   PT End of Session - 08/19/21 1021     Visit Number 14    Number of Visits 16    Date for PT Re-Evaluation 08/29/21    Authorization Type [employer]    Progress Note Due on Visit 20    PT Start Time 1022    PT Stop Time 1102    PT Time Calculation (min) 40 min    Equipment Utilized During Treatment --    Activity Tolerance Patient tolerated treatment well    Behavior During Therapy [facility] for tasks assessed/performed;Flat affect             Past Medical History:  Diagnosis Date   Anxiety    Arthritis    GERD (gastroesophageal reflux disease)    occ remote history   Hypertension    Parkinson disease (HCC)    Prostate cancer (HCC)    Right knee pain    questionable meniscal tear   Seasonal allergies     Past Surgical History:  Procedure Laterality Date   COLONOSCOPY     Leg Laceration Repair  Left    Age 83   PELVIC LYMPH NODE DISSECTION Bilateral 03/22/2018   Procedure: PELVIC LYMPH NODE DISSECTION;  Surgeon: Rene Paci, MD;  Location: [facility];  Service: Urology;  Laterality: Bilateral;   PROSTATE BIOPSY     ROBOT ASSISTED LAPAROSCOPIC RADICAL PROSTATECTOMY N/A 03/22/2018   Procedure: XI ROBOTIC ASSISTED LAPAROSCOPIC PROSTATECTOMY;  Surgeon: Rene Paci, MD;  Location: [facility];  Service: Urology;  Laterality: N/A;   TOTAL KNEE ARTHROPLASTY Right 05/05/2019   Procedure: RIGHT TOTAL KNEE ARTHROPLASTY;  Surgeon: Gean Birchwood, MD;  Location: [facility];  Service: Orthopedics;  Laterality: Right;    There were no vitals filed for this visit.   Subjective Assessment - 08/19/21 1021     Subjective Tired today, as it's  not been a good sleep week.  Balance not been great.  No falls.  I do like the exercise class and plan to continue.  Balance feels off upon first standing    Pertinent History Shoulder pain and back pain (?arthritis); saw orthopedist to f/u wtih R knee (pain from TKR) and mild tendonitis    Patient Stated Goals Pt's goals for therapy are to move normally and move better.  Wife-not fall.    Currently in Pain? No/denies                               [facility] Adult PT Treatment/Exercise - 08/19/21 0001       Transfers   Transfers Sit to Stand;Stand to Sit    Sit to Stand 5: Supervision;Without upper extremity assist;From bed    Stand to Sit 5: Supervision;Without upper extremity assist;To bed    Number of Reps 2 sets;Other reps (comment)   5 reps   Comments Cues for visual target upon standing to assist with balance      Ambulation/Gait   Ambulation/Gait Yes    Ambulation/Gait Assistance 5: Supervision    Ambulation Distance (Feet) 170 Feet    Assistive device None    Gait Pattern Decreased step length - right;Decreased step  length - left;Decreased arm swing - left;Decreased arm swing - right;Step-through pattern;Right flexed knee in stance;Left flexed knee in stance;Trunk flexed   Improved arm swing and posture noted   Ambulation Surface Level;Indoor    Pre-Gait Activities Cues to use PWR! Up and rocking lateral through hips upon first standing for improved stability upon first standing up.      High Level Balance   High Level Balance Comments Four square step activity, stepping over cones:  forward>side>back>side, x 6 reps.  Cues for increased step height and length for improved foot clearance; cues for wider BOS upon midline stance.  Min guard and occasional UE support.      Knee/Hip Exercises: Aerobic   Nustep NuStep, Level 5, 4 extremities x 8 minutes, cues to keep steps/min>80                 Balance Exercises - 08/19/21 0001       Balance Exercises:  Standing   Balance Beam PWR! Up x 10, rocking side to side x 10 reps light UE support    Retro Gait 5 reps;Limitations    Retro Gait Limitations Forward/back walking in parallel bars, cues for posture, step length.    Other Standing Exercises Forward>back step and weigthshift x 10 reps on solid surface, then on balance beam.  Increased unsteadiness on RLE as stance.  Improved with cues for R knee extension with LLE swinging through.    Other Standing Exercises Comments Resisted backward stepping in paralllel bars, transitioning forward back with intermittent UE support.  Posterior step and weightshift with light perturbations at hips, 5 reps each leg, then alternating legs 5 reps each.  Pt able to regain balance with one step and return to midline.                PT Education - 08/19/21 1202     Education Details Provided information on transition of PWR! [employer] Classes on Wednesdays, starting April 12th    Person(s) Educated Patient    Methods Explanation;Handout    Comprehension Verbalized understanding              PT Short Term Goals - 07/18/21 1522       PT SHORT TERM GOAL #1   Title Pt will be indepedent with Parkinson's specific HEP to address balance, transfers, gait for improved overall mobility.  TARGET 07/01/2021    Time 4    Period Weeks    Status Achieved      PT SHORT TERM GOAL #2   Title Pt will improve 5x sit<>stand to less than or equal to 10 sec to demonstrate improved functional strength and transfer efficiency.    Baseline 11.66 sec    Time 3    Period Weeks    Status Partially Met   11 sec   Target Date 08/08/21      PT SHORT TERM GOAL #3   Title Pt will improve TUG score to less than or equal to 13.5 sec for decreased fall risk.    Baseline 16.19 sec    Time 4    Period Weeks    Status Achieved      PT SHORT TERM GOAL #4   Title Pt will perform push and release test in posterior direction in 2 steps or less for improved balance  recovery.    Baseline 4 steps, would fall if unaided; 2-3 steps in posterior direction and able to recover on his own    Time  3    Period Weeks    Status Partially Met   7 steps   Target Date 08/08/21               PT Long Term Goals - 08/19/21 1210       PT LONG TERM GOAL #1   Title Pt will be independent with progression of HEP for Parkinson's related deficits.  TARGET 08/29/2021    Time 6    Period Weeks    Status On-going    Target Date 08/29/21      PT LONG TERM GOAL #2   Title Pt will perform at least 8 of 10 reps of sit<>stand from <18" surfaces, for improved transfer efficiency and safety.    Time 8    Period Weeks    Status Achieved      PT LONG TERM GOAL #3   Title Pt will improve MiniBESTest score to at least 21/28 for decreased fall risk.    Baseline 14/28    Time 6    Period Weeks    Status On-going   19/28   Target Date 08/29/21      PT LONG TERM GOAL #4   Title Pt will improve TUG cognitive score to less than or equal to 15 sec for improved dual tasking with gait.    Baseline 18.44 sec    Time 8    Period Weeks    Status Achieved      PT LONG TERM GOAL #5   Title Pt will verbalize plans for continued community fitness upon d/c from PT.    Time 6    Period Weeks    Status On-going   contemplating cycling class, walking for exercise, PWR class   Target Date 08/29/21                   Plan - 08/19/21 1022     Clinical Impression Statement Skilled PT focused today on balance on level and compliant surfaces, as well as step stragegies in posterior direciton.  Pt does have increased difficulty with resisted posterior stepping, but does a good job with transitions for backward/forward directions in parallel bars, with UE support.  He is demonstrating improved posture with gait and improved posture with standing exercises, though he does benefit from cues for knee extension ("PWR! Up") in standing to improve stability.    Comorbidities anxiety,  arthritis, GERD, HTN, R TKR    PT Frequency 1x / week    PT Duration 6 weeks    PT Treatment/Interventions ADLs/Self Care Home Management;Gait training;Stair training;Functional mobility training;Therapeutic activities;Therapeutic exercise;Balance training;Neuromuscular re-education;DME Instruction;Patient/family education;Manual techniques;Passive range of motion    PT Next Visit Plan Continue to work on balance recovery, especially in posterior directions; large amplitude movement patterns, varied surfaces and gait training; reactive balance challenges. Work towards LTGs-discuss POC (renew vs. d/c)    Consulted and Agree with Plan of Care Patient             Patient will benefit from skilled therapeutic intervention in order to improve the following deficits and impairments:  Abnormal gait, Difficulty walking, Decreased balance, Impaired flexibility, Postural dysfunction, Decreased strength, Decreased mobility  Visit Diagnosis: Muscle weakness (generalized)  Unsteadiness on feet  Abnormal posture  Other abnormalities of gait and mobility     Problem List Patient Active Problem List   Diagnosis Date Noted   S/P TKR (total knee replacement), right 05/05/2019   Osteoarthritis of right knee 05/02/2019   Prostate  cancer (HCC) 03/22/2018   Malignant neoplasm of prostate (HCC) 02/27/2018    Aidenjames Heckmann W., PT 08/19/2021, 12:11 PM   [facility] [address], [address] [place], [place], [address] Phone: [phone]   Fax:  [phone]  Name: Matthew [name] MRN: [id number] Date of Birth: 25-Sep-1950

## 2021-08-24 ENCOUNTER — Encounter: Payer: Self-pay | Admitting: Physical Therapy

## 2021-08-24 ENCOUNTER — Ambulatory Visit: Payer: PPO | Attending: Neurology | Admitting: Physical Therapy

## 2021-08-24 DIAGNOSIS — M6281 Muscle weakness (generalized): Secondary | ICD-10-CM | POA: Diagnosis not present

## 2021-08-24 DIAGNOSIS — R293 Abnormal posture: Secondary | ICD-10-CM | POA: Insufficient documentation

## 2021-08-24 DIAGNOSIS — R2681 Unsteadiness on feet: Secondary | ICD-10-CM | POA: Insufficient documentation

## 2021-08-24 DIAGNOSIS — R2689 Other abnormalities of gait and mobility: Secondary | ICD-10-CM | POA: Insufficient documentation

## 2021-08-24 NOTE — Therapy (Signed)
[facility] [address] [place], [place], [address] Phone: [phone]   Fax:  [phone]  Physical Therapy Discharge Summary  Patient Details  Name: Matthew [name] MRN: [id number] Date of Birth: August 21, 1950 Referring Provider (PT): Tat, Lurena Joiner   Progress Note Reporting Period 07/25/21 to 08/24/21  See note below for Objective Data and Assessment of Progress/Goals.      Encounter Date: 08/24/2021   PT End of Session - 08/24/21 1353     Visit Number 15    Number of Visits 16    Date for PT Re-Evaluation 08/29/21    Authorization Type [employer]    Progress Note Due on Visit 20    PT Start Time 1319    PT Stop Time 1349    PT Time Calculation (min) 30 min    Equipment Utilized During Treatment Gait belt    Activity Tolerance Patient tolerated treatment well    Behavior During Therapy WFL for tasks assessed/performed;Flat affect             Past Medical History:  Diagnosis Date   Anxiety    Arthritis    GERD (gastroesophageal reflux disease)    occ remote history   Hypertension    Parkinson disease (HCC)    Prostate cancer (HCC)    Right knee pain    questionable meniscal tear   Seasonal allergies     Past Surgical History:  Procedure Laterality Date   COLONOSCOPY     Leg Laceration Repair  Left    Age 71   PELVIC LYMPH NODE DISSECTION Bilateral 03/22/2018   Procedure: PELVIC LYMPH NODE DISSECTION;  Surgeon: Rene Paci, MD;  Location: [facility];  Service: Urology;  Laterality: Bilateral;   PROSTATE BIOPSY     ROBOT ASSISTED LAPAROSCOPIC RADICAL PROSTATECTOMY N/A 03/22/2018   Procedure: XI ROBOTIC ASSISTED LAPAROSCOPIC PROSTATECTOMY;  Surgeon: Rene Paci, MD;  Location: [facility];  Service: Urology;  Laterality: N/A;   TOTAL KNEE ARTHROPLASTY Right 05/05/2019   Procedure: RIGHT TOTAL KNEE ARTHROPLASTY;  Surgeon: Gean Birchwood, MD;  Location: [facility];  Service: Orthopedics;   Laterality: Right;    There were no vitals filed for this visit.   Subjective Assessment - 08/24/21 1319     Subjective Doing okay. Asking if there's any activities he needs to avoid in trying to lose weight.    Pertinent History Shoulder pain and back pain (?arthritis); saw orthopedist to f/u wtih R knee (pain from TKR) and mild tendonitis    Patient Stated Goals Pt's goals for therapy are to move normally and move better.  Wife-not fall.    Currently in Pain? No/denies                [facility] PT Assessment - 08/24/21 1323       Assessment   Medical Diagnosis Parkinson's disease    Referring Provider (PT) Tat, Lurena Joiner    Onset Date/Surgical Date 05/26/21      Standardized Balance Assessment   Five times sit to stand comments  9.39   without UEs     Mini-BESTest   Sit To Stand Normal: Comes to stand without use of hands and stabilizes independently.    Rise to Toes Normal: Stable for 3 s with maximum height.    Stand on one leg (left) Moderate: < 20 s    Stand on one leg (right) Normal: 20 s.    Stand on one leg - lowest score 1  Compensatory Stepping Correction - Forward Normal: Recovers independently with a single, large step (second realignement is allowed).    Compensatory Stepping Correction - Backward Normal: Recovers independently with a single, large step    Compensatory Stepping Correction - Left Lateral Moderate: Several steps to recover equilibrium    Compensatory Stepping Correction - Right Lateral Moderate: Several steps to recover equilibrium    Stepping Corredtion Lateral - lowest score 1    Stance - Feet together, eyes open, firm surface  Normal: 30s    Stance - Feet together, eyes closed, foam surface  Normal: 30s    Incline - Eyes Closed Normal: Stands independently 30s and aligns with gravity    Change in Gait Speed Normal: Significantly changes walkling speed without imbalance    Walk with head turns - Horizontal Moderate: performs head turns with  reduction in gait speed.    Walk with pivot turns Moderate:Turns with feet close SLOW (>4 steps) with good balance.    Step over obstacles Normal: Able to step over box with minimal change of gait speed and with good balance.    Timed UP & GO with Dual Task Moderate: Dual Task affects either counting OR walking (>10%) when compared to the TUG without Dual Task.    Mini-BEST total score 23      Timed Up and Go Test   Normal TUG (seconds) 10.18    Manual TUG (seconds) 12.13    Cognitive TUG (seconds) 13.99    TUG Comments 19.2% increased time with TUG manual, 37.4% increased with TUG cog                                    PT Education - 08/24/21 1352     Education Details discussion on objective testing today and edu on continuing PWR classes, walking, or spin classes    Person(s) Educated Patient    Methods Explanation;Demonstration;Tactile cues;Verbal cues    Comprehension Verbalized understanding              PT Short Term Goals - 08/24/21 1354       PT SHORT TERM GOAL #1   Title Pt will be indepedent with Parkinson's specific HEP to address balance, transfers, gait for improved overall mobility.  TARGET 07/01/2021    Time 4    Period Weeks    Status Achieved      PT SHORT TERM GOAL #2   Title Pt will improve 5x sit<>stand to less than or equal to 10 sec to demonstrate improved functional strength and transfer efficiency.    Baseline 11.66 sec    Time 3    Period Weeks    Status Achieved   9.39 sec   Target Date 08/08/21      PT SHORT TERM GOAL #3   Title Pt will improve TUG score to less than or equal to 13.5 sec for decreased fall risk.    Baseline 16.19 sec    Time 4    Period Weeks    Status Achieved      PT SHORT TERM GOAL #4   Title Pt will perform push and release test in posterior direction in 2 steps or less for improved balance recovery.    Baseline 4 steps, would fall if unaided; 2-3 steps in posterior direction and able to  recover on his own    Time 3    Period Weeks  Status Achieved   1 step   Target Date 08/08/21               PT Long Term Goals - 08/24/21 1355       PT LONG TERM GOAL #1   Title Pt will be independent with progression of HEP for Parkinson's related deficits.  TARGET 08/29/2021    Time 6    Period Weeks    Status Achieved    Target Date 08/29/21      PT LONG TERM GOAL #2   Title Pt will perform at least 8 of 10 reps of sit<>stand from <18" surfaces, for improved transfer efficiency and safety.    Time 8    Period Weeks    Status Achieved      PT LONG TERM GOAL #3   Title Pt will improve MiniBESTest score to at least 21/28 for decreased fall risk.    Baseline 14/28    Time 6    Period Weeks    Status Achieved   23/28   Target Date 08/29/21      PT LONG TERM GOAL #4   Title Pt will improve TUG cognitive score to less than or equal to 15 sec for improved dual tasking with gait.    Baseline 18.44 sec    Time 8    Period Weeks    Status Achieved      PT LONG TERM GOAL #5   Title Pt will verbalize plans for continued community fitness upon d/c from PT.    Time 6    Period Weeks    Status Achieved   reports plans to continue with PWR classes   Target Date 08/29/21                   Plan - 08/24/21 1354     Clinical Impression Statement Patient arrived to session without new complaints. Patient scored 9.39 sec on 5xSTS, much improved from last session and meeting this goal. Patient scored 23/28 on MiniBest, indicating improvement and decreased risk of falls. Did require slightly increased time with TUG manual and cognitive, demonstrating difficulty with dual task activities. Much improved ability to demonstrate reactive balance recovery today. Patient also expressed plans to continue with continuing PWR classes, HEP, and encouraged walking for fitness. Patient has met all goals at this time and is ready for transition to HEP.    Comorbidities anxiety,  arthritis, GERD, HTN, R TKR    PT Frequency 1x / week    PT Duration 6 weeks    PT Treatment/Interventions ADLs/Self Care Home Management;Gait training;Stair training;Functional mobility training;Therapeutic activities;Therapeutic exercise;Balance training;Neuromuscular re-education;DME Instruction;Patient/family education;Manual techniques;Passive range of motion    PT Next Visit Plan DC at this time with screen in 6 months    Consulted and Agree with Plan of Care Patient             Patient will benefit from skilled therapeutic intervention in order to improve the following deficits and impairments:  Abnormal gait, Difficulty walking, Decreased balance, Impaired flexibility, Postural dysfunction, Decreased strength, Decreased mobility  Visit Diagnosis: Muscle weakness (generalized)  Unsteadiness on feet  Abnormal posture  Other abnormalities of gait and mobility     Problem List Patient Active Problem List   Diagnosis Date Noted   S/P TKR (total knee replacement), right 05/05/2019   Osteoarthritis of right knee 05/02/2019   Prostate cancer (HCC) 03/22/2018   Malignant neoplasm of prostate (HCC) 02/27/2018    PHYSICAL THERAPY  DISCHARGE SUMMARY  Visits from Start of Care: 15   Current functional level related to goals / functional outcomes: See above clinical impression   Remaining deficits: none   Education / Equipment: HEP  Plan: Patient agrees to discharge.  Patient goals were met. Patient is being discharged due to meeting the stated rehab goals.      Anette Guarneri, PT, DPT 08/24/21 2:00 PM   [facility] [address], [address] [place], [place], [address] Phone: [phone]   Fax:  [phone]  Name: Telvin Minzey MRN: [id number] Date of Birth: [birth year]

## 2021-08-31 ENCOUNTER — Ambulatory Visit: Payer: PPO | Admitting: Physical Therapy

## 2021-09-01 ENCOUNTER — Ambulatory Visit: Payer: PPO | Admitting: Physical Therapy

## 2021-09-07 ENCOUNTER — Ambulatory Visit: Payer: PPO | Admitting: Physical Therapy

## 2021-09-08 ENCOUNTER — Ambulatory Visit: Payer: PPO | Admitting: Physical Therapy

## 2021-09-15 ENCOUNTER — Other Ambulatory Visit: Payer: Self-pay | Admitting: Neurology

## 2021-09-15 ENCOUNTER — Other Ambulatory Visit: Payer: Self-pay

## 2021-09-15 DIAGNOSIS — G20A1 Parkinson's disease without dyskinesia, without mention of fluctuations: Secondary | ICD-10-CM

## 2021-09-15 DIAGNOSIS — G20 Parkinson's disease: Secondary | ICD-10-CM

## 2021-09-30 DIAGNOSIS — R7309 Other abnormal glucose: Secondary | ICD-10-CM | POA: Diagnosis not present

## 2021-09-30 DIAGNOSIS — I10 Essential (primary) hypertension: Secondary | ICD-10-CM | POA: Diagnosis not present

## 2021-09-30 DIAGNOSIS — E7800 Pure hypercholesterolemia, unspecified: Secondary | ICD-10-CM | POA: Diagnosis not present

## 2021-10-05 DIAGNOSIS — D229 Melanocytic nevi, unspecified: Secondary | ICD-10-CM | POA: Diagnosis not present

## 2021-10-05 DIAGNOSIS — L820 Inflamed seborrheic keratosis: Secondary | ICD-10-CM | POA: Diagnosis not present

## 2021-10-05 DIAGNOSIS — L578 Other skin changes due to chronic exposure to nonionizing radiation: Secondary | ICD-10-CM | POA: Diagnosis not present

## 2021-10-05 DIAGNOSIS — Q279 Congenital malformation of peripheral vascular system, unspecified: Secondary | ICD-10-CM | POA: Diagnosis not present

## 2021-10-05 DIAGNOSIS — L700 Acne vulgaris: Secondary | ICD-10-CM | POA: Diagnosis not present

## 2021-10-05 DIAGNOSIS — L814 Other melanin hyperpigmentation: Secondary | ICD-10-CM | POA: Diagnosis not present

## 2021-10-05 DIAGNOSIS — D1801 Hemangioma of skin and subcutaneous tissue: Secondary | ICD-10-CM | POA: Diagnosis not present

## 2021-10-05 DIAGNOSIS — L821 Other seborrheic keratosis: Secondary | ICD-10-CM | POA: Diagnosis not present

## 2021-10-06 DIAGNOSIS — Z79899 Other long term (current) drug therapy: Secondary | ICD-10-CM | POA: Diagnosis not present

## 2021-10-06 DIAGNOSIS — R7309 Other abnormal glucose: Secondary | ICD-10-CM | POA: Diagnosis not present

## 2021-10-06 DIAGNOSIS — M549 Dorsalgia, unspecified: Secondary | ICD-10-CM | POA: Diagnosis not present

## 2021-10-06 DIAGNOSIS — Z0000 Encounter for general adult medical examination without abnormal findings: Secondary | ICD-10-CM | POA: Diagnosis not present

## 2021-10-06 DIAGNOSIS — E7800 Pure hypercholesterolemia, unspecified: Secondary | ICD-10-CM | POA: Diagnosis not present

## 2021-10-14 ENCOUNTER — Other Ambulatory Visit: Payer: Self-pay | Admitting: Neurology

## 2021-10-27 ENCOUNTER — Other Ambulatory Visit: Payer: Self-pay | Admitting: Neurology

## 2021-10-27 DIAGNOSIS — G20 Parkinson's disease: Secondary | ICD-10-CM

## 2021-10-27 DIAGNOSIS — G20A1 Parkinson's disease without dyskinesia, without mention of fluctuations: Secondary | ICD-10-CM

## 2021-11-21 DIAGNOSIS — C61 Malignant neoplasm of prostate: Secondary | ICD-10-CM | POA: Diagnosis not present

## 2021-11-21 NOTE — Progress Notes (Signed)
Assessment/Plan:   1.  Parkinsons Disease  -Continue carbidopa/levodopa 25/100, 2 tablets at 9 AM/noon/3 PM/6 PM  -Continue carbidopa/levodopa 50/200 CR at bedtime  -Continue entacapone, 200 mg 3 times per day  -discussed walker at all times  -on restore gold.  I don't care for this supplement  -on neuroshine (a lithium supplement).  Discussed that this could increase tremor, although he really does not have tremor currently.  -start selegeline, 5 mg bid.  R/B/SE were discussed.  The opportunity to ask questions was given and they were answered to the best of my ability.  The patient expressed understanding and willingness to follow the outlined treatment protocols.   2.  Sialorrhea  -This is commonly associated with PD.  We talked about treatments.  The patient is not a candidate for oral anticholinergic therapy because of increased risk of confusion and falls.  We discussed Botox (type A and B) and 1% atropine drops.  We discusssed that candy like lemon drops can help by stimulating mm of the oropharynx to induce swallowing.  He would like to trial botox and we will try to get authorization.    3.  Dysphagia  -discussed mbe.  We will proceed   Subjective:   Matthew [name] was seen today in follow up for Parkinsons disease.  My previous records were reviewed prior to todays visit as well as outside records available to me.  Wife with patient and supplements hx.   We added pramipexole to his levodopa last visit.  A few weeks later (still on the titration dose), patient's wife called and stated that his feet and ankles were swelling.  The medication was stopped.  Entacapone was added.  He had one fall - last week.  He had come in and picked up a box and turned too fast and lost his balance and he landed on left side.  He didn't really get hurt but had a lot of trouble getting off of the floor and wife wasn't home.  He wasn't using walker faithfully before that but he is now (over the last  week).  He is doing [employer] class and Dance class.  Wife feels he is having more trouble feeding, dressing, showering.  No hallucinations.  He has started on neuroshine (a lithium supplement).  They feel that it has helped him lift his arms.    Current prescribed movement disorder medications: carbidopa/levodopa 25/100, 2 tablets at 9am/12noon/3pm/6pm. Carbidopa/levodopa 50/200 CR at bed (added last visit) Entacapone, 200 mg 3 times per day with first 3 dosages of levodopa  PREVIOUS MEDICATIONS: Requip (headache, drowsiness, incontinence at only 0.25 mg dose); rotigotine (feet/leg swelling); pramipexole (reported feet/ankles swelling at low-dose)  ALLERGIES:   Allergies  Allergen Reactions   Other     Senior dose of flu shot, cause hear palpitations, anxious   Peanut-Containing Drug Products Other (See Comments)    Nasal congestion   Ropinirole Other (See Comments)    Headache drowsiness trouble urinating     CURRENT MEDICATIONS:  Outpatient Encounter Medications as of 12/05/2021  Medication Sig   Ascorbic Acid (VITAMIN C) 1000 MG tablet Take 1,000 mg by mouth daily.   calcium carbonate (OS-CAL) 1250 (500 Ca) MG chewable tablet Chew 1 tablet by mouth daily.   carbidopa-levodopa (SINEMET CR) 50-200 MG tablet TAKE ONE TABLET BY MOUTH EVERYDAY AT BEDTIME   carbidopa-levodopa (SINEMET IR) 25-100 MG tablet TAKE TWO TABLETS BY MOUTH FOUR TIMES DAILY   docusate sodium (COLACE) 100 MG capsule Take  100 mg by mouth 2 (two) times daily.   entacapone (COMTAN) 200 MG tablet TAKE ONE TABLET BY MOUTH THREE TIMES DAILY. take with THE first three dosages of levodopa   fluticasone (FLONASE) 50 MCG/ACT nasal spray Place 1-2 sprays into both nostrils daily as needed for allergies or rhinitis.   loratadine (CLARITIN) 10 MG tablet Take 10 mg by mouth daily.   OVER THE COUNTER MEDICATION Take 4-6 tablets by mouth in the morning, at noon, and at bedtime. RESTORE GOLD 6 tablet in am, 6 tab at lunch and 4  tablet in evening   pramipexole (MIRAPEX) 0.5 MG tablet Take 1 tablet (0.5 mg total) by mouth 3 (three) times daily.   vitamin B-12 (CYANOCOBALAMIN) 500 MCG tablet Take 500 mcg by mouth daily.   zinc gluconate 50 MG tablet Take 50 mg by mouth daily.   lisinopril (ZESTRIL) 10 MG tablet Take 10 mg by mouth daily. (Patient not taking: Reported on 12/05/2021)   mirabegron ER (MYRBETRIQ) 50 MG TB24 tablet Take 50 mg by mouth daily.   Multiple Vitamin (MULTIVITAMIN) tablet Take 1 tablet by mouth daily.   pramipexole (MIRAPEX) 0.125 MG tablet 1 tablet three times per day for a week, then 2 po three times per day for a week   No facility-administered encounter medications on file as of 12/05/2021.    Objective:   PHYSICAL EXAMINATION:    VITALS:   Vitals:   12/05/21 1411  BP: 134/80  Pulse: 61  Resp: 18  SpO2: 96%  Weight: 164 lb (74.4 kg)  Height: '5\' 6"'$  (1.676 m)     GEN:  The patient appears stated age and is in NAD. HEENT:  Normocephalic, atraumatic.  The mucous membranes are moist. The superficial temporal arteries are without ropiness or tenderness.  Currently carrying a drool cloth.  Neurological examination:  Orientation: The patient is alert and oriented x3. Cranial nerves: There is good facial symmetry with facial hypomimia. The speech is fluent and clear. Soft palate rises symmetrically and there is no tongue deviation. Hearing is intact to conversational tone. Sensation: Sensation is intact to light touch throughout Motor: Strength is at least antigravity x4.  Movement examination: Tone: There is normal tone in the UE/LE Abnormal movements: none, even with distraction procedures Coordination:  There is decremation with finger taps bilaterally and hand opening and closing bilaterally.   Gait and Station: The patient pushes off to arise.  He ambulates slow with flexed posture in the walker.  Without the walker, he is able to walk fairly well, but it is purposeful.  I have  reviewed and interpreted the following labs independently    Chemistry      Component Value Date/Time   NA 135 05/06/2019 0230   K 4.0 05/06/2019 0230   CL 104 05/06/2019 0230   CO2 24 05/06/2019 0230   BUN 22 05/06/2019 0230   CREATININE 0.98 05/06/2019 0230      Component Value Date/Time   CALCIUM 8.7 (L) 05/06/2019 0230       Lab Results  Component Value Date   WBC 9.6 05/06/2019   HGB 9.9 (L) 05/06/2019   HCT 28.9 (L) 05/06/2019   MCV 94.4 05/06/2019   PLT 141 (L) 05/06/2019    No results found for: "TSH"   Total time spent on today's visit was 41 minutes, including both face-to-face time and nonface-to-face time.  Time included that spent on review of records (prior notes available to me/labs/imaging if pertinent), discussing treatment and goals,  answering patient's questions and coordinating care.  Cc:  Lawerance Cruel, MD

## 2021-11-28 DIAGNOSIS — C61 Malignant neoplasm of prostate: Secondary | ICD-10-CM | POA: Diagnosis not present

## 2021-11-28 DIAGNOSIS — N3946 Mixed incontinence: Secondary | ICD-10-CM | POA: Diagnosis not present

## 2021-12-05 ENCOUNTER — Ambulatory Visit: Payer: PPO | Admitting: Neurology

## 2021-12-05 ENCOUNTER — Encounter: Payer: Self-pay | Admitting: Neurology

## 2021-12-05 VITALS — BP 134/80 | HR 61 | Resp 18 | Ht 66.0 in | Wt 164.0 lb

## 2021-12-05 DIAGNOSIS — G20 Parkinson's disease: Secondary | ICD-10-CM

## 2021-12-05 DIAGNOSIS — G20A1 Parkinson's disease without dyskinesia, without mention of fluctuations: Secondary | ICD-10-CM

## 2021-12-05 DIAGNOSIS — R1310 Dysphagia, unspecified: Secondary | ICD-10-CM | POA: Diagnosis not present

## 2021-12-05 MED ORDER — ENTACAPONE 200 MG PO TABS: 200.0000 mg | ORAL_TABLET | Freq: Three times a day (TID) | ORAL | 2 refills | Status: DC

## 2021-12-05 MED ORDER — SELEGILINE HCL 5 MG PO TABS: 5.0000 mg | ORAL_TABLET | Freq: Two times a day (BID) | ORAL | 1 refills | Status: DC

## 2021-12-06 ENCOUNTER — Other Ambulatory Visit (HOSPITAL_COMMUNITY): Payer: Self-pay

## 2021-12-06 DIAGNOSIS — R1310 Dysphagia, unspecified: Secondary | ICD-10-CM

## 2021-12-06 NOTE — Addendum Note (Signed)
Addended by: Armen Pickup A on: 12/06/2021 09:20 AM   Modules accepted: Orders

## 2021-12-12 ENCOUNTER — Telehealth (HOSPITAL_COMMUNITY): Payer: Self-pay | Admitting: Pharmacy Technician

## 2021-12-12 ENCOUNTER — Other Ambulatory Visit (HOSPITAL_COMMUNITY): Payer: Self-pay

## 2021-12-12 NOTE — Telephone Encounter (Signed)
Patient Advocate Encounter   Received notification that prior authorization for Myobloc 5000UNIT/ML solution is required.   PA submitted on 12/12/2021 Key [id number] Status is pending       Lyndel Safe, [place] Patient Advocate Specialist [place] Patient Advocate Team Direct Number: [phone]  Fax: [phone]

## 2021-12-12 NOTE — Telephone Encounter (Signed)
PA was submitted for pharmacy benefit- still pending.  Called [employer] to check medical benefit- Buy/Bill:  Findings of benefits investigation for?Myobloc 500 units- [id number] and [phone]?   Dx code- K11.7  Dose- every 12 weeks  Insurance:?[employer]   Phone:??[phone] - repJenny Reichmann  Ref# [id number]  Plan is ACTIVE   Plan?follows Medicare guidelines and?covers 80% of the infusion and no authorization is required?for [id number] and [phone]. Patient will be required to pay the 20% coinsurance per each infusion, which will be applied to [facility]. Patient has met $445 as of $3200. No deductible applies.

## 2021-12-12 NOTE — Telephone Encounter (Signed)
-----   Message from Nira Conn, [place] sent at 12/08/2021  1:02 PM EDT ----- I clarified with Dr. Carles Collet. It is for Myobloc, which is a botox type B. ----- Message ----- From: Lenon Oms, CPhT Sent: 12/08/2021   9:16 AM EDT To: Nira Conn, CMA; Rx Prior Auth Team  I do not see anything about Myobloc in his chart? ----- Message ----- From: Nira Conn, CMA Sent: 12/05/2021   2:51 PM EDT To: Rx Prior Auth Team  Hello,  Patient is needing a prior authorization started for Myobloc 5000units.  Thank you, Matthew [name]

## 2021-12-13 ENCOUNTER — Other Ambulatory Visit (HOSPITAL_COMMUNITY): Payer: Self-pay

## 2021-12-13 NOTE — Telephone Encounter (Signed)
Patient Advocate Encounter  Prior Authorization for Myobloc 5000UNIT/ML solution has been approved.    PA# [id number] Effective dates: 12/13/2021 through 05/21/2022  Prescription Can be filled at [place], [place] Patient [place] Patient Advocate Team Direct Number: [phone]  Fax: [phone]

## 2021-12-14 ENCOUNTER — Telehealth: Payer: Self-pay | Admitting: Neurology

## 2021-12-14 NOTE — Telephone Encounter (Signed)
Patients wife called to see about the Botox approval for [facility].

## 2021-12-15 ENCOUNTER — Other Ambulatory Visit (HOSPITAL_COMMUNITY): Payer: Self-pay

## 2021-12-15 ENCOUNTER — Other Ambulatory Visit: Payer: Self-pay

## 2021-12-15 DIAGNOSIS — K117 Disturbances of salivary secretion: Secondary | ICD-10-CM

## 2021-12-15 MED ORDER — MYOBLOC 5000 UNIT/ML IM SOLN
INTRAMUSCULAR | 0 refills | Status: DC
  Filled 2021-12-15: qty 1, 90d supply, fill #0
  Filled 2021-12-15: qty 1, fill #0

## 2021-12-15 NOTE — Telephone Encounter (Signed)
Called patients wife and patient was approved and the prescription. Prescription has been sent to [facility] long out patient pharmacy

## 2021-12-16 ENCOUNTER — Other Ambulatory Visit (HOSPITAL_COMMUNITY): Payer: Self-pay

## 2021-12-19 ENCOUNTER — Telehealth: Payer: Self-pay | Admitting: Neurology

## 2021-12-19 NOTE — Telephone Encounter (Signed)
Patient's wife called and said the Botox is on the way from the specialty pharmacy.  When it arrives, she is ready to schedule her spouse.

## 2021-12-20 ENCOUNTER — Other Ambulatory Visit (HOSPITAL_COMMUNITY): Payer: Self-pay

## 2021-12-21 NOTE — Telephone Encounter (Signed)
Spoke with patient wife he is sch for 12-23-21 for Botox  with Tat

## 2021-12-22 ENCOUNTER — Ambulatory Visit (HOSPITAL_COMMUNITY)
Admission: RE | Admit: 2021-12-22 | Discharge: 2021-12-22 | Disposition: A | Discharge status: Home / Self Care | Payer: PPO | Source: Ambulatory Visit | Attending: Neurology | Admitting: Neurology

## 2021-12-22 DIAGNOSIS — R1310 Dysphagia, unspecified: Secondary | ICD-10-CM | POA: Diagnosis not present

## 2021-12-22 DIAGNOSIS — K117 Disturbances of salivary secretion: Secondary | ICD-10-CM | POA: Diagnosis not present

## 2021-12-22 DIAGNOSIS — K219 Gastro-esophageal reflux disease without esophagitis: Secondary | ICD-10-CM | POA: Insufficient documentation

## 2021-12-22 DIAGNOSIS — Z8546 Personal history of malignant neoplasm of prostate: Secondary | ICD-10-CM | POA: Insufficient documentation

## 2021-12-22 DIAGNOSIS — T17320A Food in larynx causing asphyxiation, initial encounter: Secondary | ICD-10-CM | POA: Diagnosis not present

## 2021-12-22 DIAGNOSIS — G20 Parkinson's disease: Secondary | ICD-10-CM | POA: Diagnosis not present

## 2021-12-22 DIAGNOSIS — M1990 Unspecified osteoarthritis, unspecified site: Secondary | ICD-10-CM | POA: Diagnosis not present

## 2021-12-22 DIAGNOSIS — R059 Cough, unspecified: Secondary | ICD-10-CM | POA: Diagnosis not present

## 2021-12-22 DIAGNOSIS — I10 Essential (primary) hypertension: Secondary | ICD-10-CM | POA: Diagnosis not present

## 2021-12-22 DIAGNOSIS — F419 Anxiety disorder, unspecified: Secondary | ICD-10-CM | POA: Insufficient documentation

## 2021-12-23 ENCOUNTER — Telehealth: Payer: Self-pay | Admitting: Neurology

## 2021-12-23 ENCOUNTER — Ambulatory Visit (INDEPENDENT_AMBULATORY_CARE_PROVIDER_SITE_OTHER): Payer: PPO | Admitting: Neurology

## 2021-12-23 DIAGNOSIS — K117 Disturbances of salivary secretion: Secondary | ICD-10-CM

## 2021-12-23 MED ORDER — RIMABOTULINUMTOXINB 5000 UNIT/ML IM SOLN
5000.0000 [IU] | Freq: Once | INTRAMUSCULAR | Status: AC
  Administered 2021-12-23: 5000 [IU] via INTRAMUSCULAR

## 2021-12-23 NOTE — Telephone Encounter (Signed)
Patient was seen for Myobloc injections today.  Patient asked about "the last medication you gave me" and what the purpose of it was.  Discussed with patient that on Botox days, we generally just do Botox and patient appointment days are completely separate.  Discussed with him that this is partly due to insurance regulations.  Discussed with him that I had not reviewed his chart to prepare for an appointment, but that I would look at the chart and call him back.  Patient was just seen a few weeks ago for an appointment in the office.  Noted that we started selegiline, and I believed restarted it to see if it would help fatigue.  If patient does not think it has been helpful, he can stop the medication if he would like.

## 2021-12-23 NOTE — Telephone Encounter (Signed)
Spoke to pt and his wife an informed them that Patient was seen for Myobloc injections today.  Patient asked about the last medication Dr Tat gave him and what the purpose of it was.  Discussed with patient that on Botox days, we generally just do Botox and patient appointment days are completely separate.  Discussed with him that this is partly due to insurance regulations.  Discussed with him that Dr tat had not reviewed his chart to prepare for an appointment, but that she would look at the chart and call him back.  Patient was just seen a few weeks ago for an appointment in the office.  Noted that we started selegiline, and it was  believed restarted to see if it would help fatigue.  If patient does not think it has been helpful, Dr tat stated that he can stop the medication if he would like. Pt wife stated that they are going to give it a little longer to see if help if not they will call the office to let us know he stopped it ,

## 2021-12-23 NOTE — Procedures (Signed)
Botulinum Clinic    History:  Diagnosis: Sialorrhea    Result History  N/a  Consent obtained from: The patient The patient was educated on the botulinum toxin the black blox warning and given a copy of the botox patient medication guide.  The patient understands that this warning states that there have been reported cases of the Botox extending beyond the injection site and creating adverse effects, similar to those of botulism. This included loss of strength, trouble walking, hoarseness, trouble saying words clearly, loss of bladder control, trouble breathing, trouble swallowing, diplopia, blurry vision and ptosis. Most of the distant spread of Botox was happening in patients, primarily children, who received medication for spasticity or for cervical dystonia. The patient expressed understanding and desire to proceed.     Injections  Location Left  Right Units Number of sites  Submandibular gland 250 250 500 1 per side  Parotid 2250 2250 2500 1 per side  TOTAL UNITS:     5000      Type of Toxin: Myobloc type B As ordered and injected IM at today's visit Total Units: 5000  Discarded Units: 0  Needle drawback with each injection was free of blood. Pt tolerated procedure well without complications.   Reinjection is anticipated in 3 months.

## 2022-01-15 ENCOUNTER — Other Ambulatory Visit: Payer: Self-pay | Admitting: Neurology

## 2022-01-16 ENCOUNTER — Other Ambulatory Visit: Payer: Self-pay

## 2022-01-16 MED ORDER — CARBIDOPA-LEVODOPA 25-100 MG PO TABS: 1.0000 | ORAL_TABLET | Freq: Four times a day (QID) | ORAL | 0 refills | Status: DC

## 2022-01-18 DIAGNOSIS — E7800 Pure hypercholesterolemia, unspecified: Secondary | ICD-10-CM | POA: Diagnosis not present

## 2022-01-18 DIAGNOSIS — G20 Parkinson's disease: Secondary | ICD-10-CM | POA: Diagnosis not present

## 2022-01-18 DIAGNOSIS — I10 Essential (primary) hypertension: Secondary | ICD-10-CM | POA: Diagnosis not present

## 2022-01-26 ENCOUNTER — Other Ambulatory Visit: Payer: Self-pay | Admitting: Neurology

## 2022-01-30 ENCOUNTER — Telehealth: Payer: Self-pay | Admitting: Neurology

## 2022-01-30 NOTE — Telephone Encounter (Signed)
Called [facility] and gave verbal orders for this script

## 2022-01-30 NOTE — Telephone Encounter (Signed)
The following message was left with [facility] on 01/30/22 at 1:04 PM.  Caller stated the prescription was denied for carbidopa levodopa ER 50/200 but patient only has one pill left.  The delivery is supposed to go out today.    See detailed printout in Dr. Doristine Devoid inbox.

## 2022-02-01 ENCOUNTER — Telehealth: Payer: Self-pay | Admitting: Neurology

## 2022-02-01 NOTE — Telephone Encounter (Signed)
-----   Message from [place], DO sent at 12/23/2021 11:21 AM EDT ----- Call patient and find out how he did with his first round of myobloc/botox for drooling

## 2022-02-01 NOTE — Telephone Encounter (Signed)
Spoke to patients wife Matthew [name] and she said it is definitely helping the patient . She feels like it is starting top wear off but it has helped with his drooling

## 2022-03-02 ENCOUNTER — Ambulatory Visit: Payer: PPO

## 2022-03-02 ENCOUNTER — Ambulatory Visit: Payer: PPO | Admitting: Occupational Therapy

## 2022-03-02 ENCOUNTER — Telehealth: Payer: Self-pay | Admitting: Physical Therapy

## 2022-03-02 ENCOUNTER — Ambulatory Visit: Payer: PPO | Attending: Neurology | Admitting: Physical Therapy

## 2022-03-02 DIAGNOSIS — R293 Abnormal posture: Secondary | ICD-10-CM | POA: Insufficient documentation

## 2022-03-02 DIAGNOSIS — R471 Dysarthria and anarthria: Secondary | ICD-10-CM

## 2022-03-02 DIAGNOSIS — G20A2 Parkinson's disease without dyskinesia, with fluctuations: Secondary | ICD-10-CM

## 2022-03-02 DIAGNOSIS — R29818 Other symptoms and signs involving the nervous system: Secondary | ICD-10-CM | POA: Insufficient documentation

## 2022-03-02 DIAGNOSIS — R4701 Aphasia: Secondary | ICD-10-CM | POA: Insufficient documentation

## 2022-03-02 DIAGNOSIS — R2689 Other abnormalities of gait and mobility: Secondary | ICD-10-CM | POA: Insufficient documentation

## 2022-03-02 DIAGNOSIS — R2681 Unsteadiness on feet: Secondary | ICD-10-CM | POA: Insufficient documentation

## 2022-03-02 DIAGNOSIS — R41841 Cognitive communication deficit: Secondary | ICD-10-CM | POA: Insufficient documentation

## 2022-03-02 DIAGNOSIS — R1310 Dysphagia, unspecified: Secondary | ICD-10-CM | POA: Insufficient documentation

## 2022-03-02 DIAGNOSIS — M6281 Muscle weakness (generalized): Secondary | ICD-10-CM | POA: Insufficient documentation

## 2022-03-02 DIAGNOSIS — R278 Other lack of coordination: Secondary | ICD-10-CM | POA: Insufficient documentation

## 2022-03-02 NOTE — Therapy (Signed)
[facility] [address], [place], [place], [address] Phone: [phone]   Fax:  [phone]  Patient Details  Name: Matthew [name] MRN: [id number] Date of Birth: 1951-04-10 Referring Provider:  Ludwig Clarks, DO  Encounter Date: 03/02/2022  Occupational Therapy Parkinson's Disease Screen  Hand dominance:  Right   Physical Performance Test item #2 (simulated eating):  15.69 sec  Fastening/unfastening 3 buttons in:  1:45.84  9-hole peg test:    RUE  49.13 sec        LUE  44.53 sec  Change in ability to perform ADLs/IADLs:  Pt's spouse reports pt having a lot of difficulty with eating.  Other Comments:  Pt reports having a fall ~3-4 mos ago.  Dr. Carles Collet recommended a walker for home.  Pt's spouse reports that he is falling asleep more, even when standing up.    Pt would benefit from occupational therapy evaluation due to slowing in coordination impacting ADLs and IADLs, particularly with self-feeding and dressing tasks.    Simonne Come, OTR/L 03/02/2022, 11:53 AM  [facility] [address], [place], [place], [address] Phone: [phone]   Fax:  [phone]

## 2022-03-02 NOTE — Therapy (Signed)
[facility] [address], [place], [place], [address] Phone: [phone]   Fax:  [phone]  Patient Details  Name: Cardale Dorer MRN: [id number] Date of Birth: 05/18/1951 Referring Provider:  Ludwig Clarks, DO  Encounter Date: 03/02/2022  Speech Therapy Parkinson's Disease Screen         Decibel Level today: mid 60sdB (WNL=70-72 dB) with sound level meter 30cm away from pt's mouth. Pt's conversational volume has decr'd since last treatment course. [name], pt's wife states "I have to tell him 20 times a day, 'I didn't understand you.'".    Pt denies difficulty in swallowing warranting further evaluation.   Pt would benefit from speech-language eval for dysarthria - please order via EPIC to schedule.   [place], [place] 03/02/2022, 1:28 PM  [facility] [address] [place], [place], [address] Phone: [phone]   Fax:  [phone]

## 2022-03-02 NOTE — Therapy (Signed)
[facility] [address] [place], [place], [address] Phone: [phone]   Fax:  [phone]  Patient Details  Name: Matthew [name] MRN: [id number] Date of Birth: 1950-10-16 Referring Provider:  Ludwig Clarks, DO  Encounter Date: 03/02/2022  Physical Therapy Parkinson's Disease Screen   Timed Up and Go test:22.62 sec (with rollator, increased from 16.19 sec)  10 meter walk test:16.72 sec (1.96 ft/sec)  5 time sit to stand test:14.12 sec (compared to 11.28 sec)  Patient would benefit from Physical Therapy evaluation due to slowed mobility measures compared to previous bout of therapy.  Pt's wife reports one fall with difficulty getting up from the floor.   Shonita Rinck W., PT 03/02/2022, 7:43 AM  [facility] [address], [place], [place], [address] Phone: [phone]   Fax:  [phone]

## 2022-03-02 NOTE — Telephone Encounter (Signed)
Matthew [name] was seen for PT, OT, speech therapy screens in our multi-disciplinary clinic.  Physical, occupational, speech therapy is recommended for follow up due to slowed mobility, slowed coordination and fine motor deficits, low conversation volume and dysfluency deficits.  If you agree, please send referral via Epic for PT, OT, and speech therapy.  Thank you.   Mady Haagensen, PT 03/02/22 12:42 PM Phone: [phone] Fax: [phone]  [facility] [address], [place], [place] [address] Phone # [phone] Fax # [phone]

## 2022-03-07 ENCOUNTER — Other Ambulatory Visit: Payer: Self-pay | Admitting: Neurology

## 2022-03-07 ENCOUNTER — Other Ambulatory Visit (HOSPITAL_COMMUNITY): Payer: Self-pay

## 2022-03-07 DIAGNOSIS — K117 Disturbances of salivary secretion: Secondary | ICD-10-CM

## 2022-03-07 MED ORDER — MYOBLOC 5000 UNIT/ML IM SOLN
INTRAMUSCULAR | 0 refills | Status: DC
  Filled 2022-03-07: qty 1, 90d supply, fill #0

## 2022-03-09 ENCOUNTER — Ambulatory Visit: Payer: PPO | Admitting: Occupational Therapy

## 2022-03-09 ENCOUNTER — Other Ambulatory Visit: Payer: Self-pay

## 2022-03-09 ENCOUNTER — Other Ambulatory Visit (HOSPITAL_COMMUNITY): Payer: Self-pay

## 2022-03-09 ENCOUNTER — Ambulatory Visit: Payer: PPO

## 2022-03-09 DIAGNOSIS — R293 Abnormal posture: Secondary | ICD-10-CM | POA: Diagnosis not present

## 2022-03-09 DIAGNOSIS — R29818 Other symptoms and signs involving the nervous system: Secondary | ICD-10-CM

## 2022-03-09 DIAGNOSIS — R1310 Dysphagia, unspecified: Secondary | ICD-10-CM | POA: Diagnosis present

## 2022-03-09 DIAGNOSIS — R2689 Other abnormalities of gait and mobility: Secondary | ICD-10-CM

## 2022-03-09 DIAGNOSIS — G20A2 Parkinson's disease without dyskinesia, with fluctuations: Secondary | ICD-10-CM

## 2022-03-09 DIAGNOSIS — R2681 Unsteadiness on feet: Secondary | ICD-10-CM

## 2022-03-09 DIAGNOSIS — M6281 Muscle weakness (generalized): Secondary | ICD-10-CM

## 2022-03-09 DIAGNOSIS — R4701 Aphasia: Secondary | ICD-10-CM | POA: Diagnosis present

## 2022-03-09 DIAGNOSIS — R471 Dysarthria and anarthria: Secondary | ICD-10-CM | POA: Diagnosis not present

## 2022-03-09 DIAGNOSIS — R41841 Cognitive communication deficit: Secondary | ICD-10-CM | POA: Diagnosis present

## 2022-03-09 DIAGNOSIS — R278 Other lack of coordination: Secondary | ICD-10-CM | POA: Diagnosis present

## 2022-03-09 NOTE — Therapy (Signed)
OUTPATIENT PHYSICAL THERAPY NEURO EVALUATION   Patient Name: Matthew [name] MRN: [id number] DOB:04-19-1951, 71 y.o., male Today's Date: 03/09/2022   PCP: Lawerance Cruel, MD REFERRING PROVIDER: Ludwig Clarks, DO    PT End of Session - 03/09/22 1532     Visit Number 1    Number of Visits 12    Date for PT Re-Evaluation 04/20/22    Authorization Type [employer]    Progress Note Due on Visit 10    PT Start Time 1530    PT Stop Time 5631    PT Time Calculation (min) 45 min             Past Medical History:  Diagnosis Date   Anxiety    Arthritis    GERD (gastroesophageal reflux disease)    occ remote history   Hypertension    Parkinson disease    Prostate cancer ([place])    Right knee pain    questionable meniscal tear   Seasonal allergies    Past Surgical History:  Procedure Laterality Date   COLONOSCOPY     Leg Laceration Repair  Left    Age 83   PELVIC LYMPH NODE DISSECTION Bilateral 03/22/2018   Procedure: PELVIC LYMPH NODE DISSECTION;  Surgeon: Ceasar Mons, MD;  Location: [facility];  Service: Urology;  Laterality: Bilateral;   PROSTATE BIOPSY     ROBOT ASSISTED LAPAROSCOPIC RADICAL PROSTATECTOMY N/A 03/22/2018   Procedure: XI ROBOTIC ASSISTED LAPAROSCOPIC PROSTATECTOMY;  Surgeon: Ceasar Mons, MD;  Location: [facility];  Service: Urology;  Laterality: N/A;   TOTAL KNEE ARTHROPLASTY Right 05/05/2019   Procedure: RIGHT TOTAL KNEE ARTHROPLASTY;  Surgeon: Frederik Pear, MD;  Location: [facility];  Service: Orthopedics;  Laterality: Right;   Patient Active Problem List   Diagnosis Date Noted   S/P TKR (total knee replacement), right 05/05/2019   Osteoarthritis of right knee 05/02/2019   Prostate cancer ([place]) 03/22/2018   Malignant neoplasm of prostate ([place]) 02/27/2018    ONSET DATE: 2-3 years  REFERRING DIAG: G20.A2 (ICD-10-CM) - Parkinson's disease without dyskinesia, with fluctuating manifestations   THERAPY DIAG:  Parkinson's  disease without dyskinesia, with fluctuating manifestations  Other abnormalities of gait and mobility  Muscle weakness (generalized)  Unsteadiness on feet  Abnormal posture  Rationale for Evaluation and Treatment Rehabilitation  SUBJECTIVE:                                                                                                                                                                                              SUBJECTIVE STATEMENT: At present feeling a little lightheaded after OT evaluation (BP  160/90 mmHg, 70 bpm).  Overall, having increased difficulty with mobility and balance with hx of falling in the past few months.   Vitals re-assessed before start of physical part of eval: 164/91 mmHg, 58 bpm 143/73 mmHg, 79 bpm during activity  Pt accompanied by: self  PERTINENT HISTORY: Parkinson's, right TKR,   PAIN:  Are you having pain? No  PRECAUTIONS: None  WEIGHT BEARING RESTRICTIONS No  FALLS: Has patient fallen in last 6 months? Yes. Number of falls 2-3 , unable to perform fall recovery  LIVING ENVIRONMENT: Lives with: lives with their spouse Lives in: House/apartment Stairs: Yes: Internal: 16 steps; can reach both and External: 2-3 steps; on left going up Has following equipment at home: Walker - 4 wheeled  PLOF: Independent with household mobility with device  PATIENT GOALS "get close to normal as possible"  -Be able to walk without fear of falling -Be able to throw horseshoes/darts -Be able to maintain walking program with trekking poles   OBJECTIVE:   DIAGNOSTIC FINDINGS: none recently  COGNITION: Overall cognitive status: Within functional limits for tasks assessed   SENSATION: WFL  COORDINATION: Grossly intact with some deficits in rapid, alternating and divided movement tasks  EDEMA:    MUSCLE TONE: some rigidity noted in BLE   MUSCLE LENGTH: Knee flexion contractures  DTRs:    POSTURE: forward head  LOWER EXTREMITY  ROM:     Active  Right Eval Left Eval  Hip flexion    Hip extension    Hip abduction    Hip adduction    Hip internal rotation    Hip external rotation    Knee flexion    Knee extension -10 -10  Ankle dorsiflexion 5 5  Ankle plantarflexion    Ankle inversion    Ankle eversion     (Blank rows = not tested)  LOWER EXTREMITY MMT:    Grossly 4/5 BLE  BED MOBILITY:  NT  TRANSFERS: Assistive device utilized: Quad cane large base and None  Sit to stand: Complete Independence Stand to sit: Complete Independence Chair to chair: Modified independence Floor:  NT , pt report need for total assist at this time  RAMP:  NT  CURB:  Level of Assistance: SBA Assistive device utilized: Environmental consultant - 4 wheeled Curb Comments: increased time for processing and negotiation  STAIRS:  Level of Assistance: Modified Artist Technique: Alternating Pattern  with Bilateral Rails  Number of Stairs: 5   Height of Stairs: 4-6"  Comments:   GAIT: Gait pattern: shuffling, decreased stride Distance walked:  Assistive device utilized: Environmental consultant - 4 wheeled Level of assistance: SBA Comments:   FUNCTIONAL TESTs:  5 times sit to stand: 11 sec Timed up and go (TUG): 15 sec Mini-BEST test: 14/28 Gait Speed: 16.28 sec = 2.01 ft/sec  PATIENT SURVEYS:     PATIENT EDUCATION: Education details: assessment findings Person educated: Patient Education method: Explanation Education comprehension: verbalized understanding and needs further education   HOME EXERCISE PROGRAM: TBD    GOALS: Goals reviewed with patient? Yes  SHORT TERM GOALS: Target date: 03/31/2022  Patient will be able to perform home program independently for self-management. Baseline: Goal status: INITIAL  2.  Improve safety with mobility per time 12 sec TUG to reduce risk for falls Baseline: 15 sec w/ 4WW Goal status: INITIAL   LONG TERM GOALS: Target date: 04/20/2022  Improve safety and  reduce risk for falls with mobility per sore 22/28 Mini-BEST test Baseline: 14/28 Goal status: INITIAL  2.  Demonstrate increased gait speed to 3 ft/sec to improve efficiency with community ambulation  Baseline: 2 ft/sec Goal status: INITIAL  3.  Demo supervision level for floor to stand transfers to improve functional mobility and facilitate participation in PD-specific exercise class Baseline: NT, pt reports total assist at this time Goal status: INITIAL  4.  Demonstrate coordination and ability to perform throwing tasks to facilitate participation in leisure activities (horseshoes and darts) Baseline: reports unable to perform Goal status: INITIAL    ASSESSMENT:  CLINICAL IMPRESSION: Patient is a 71 y.o. male who was seen today for physical therapy evaluation and treatment for movement deficits and activity limitations related to PD and subsequent sequelae. Pt would benefit from PT services to improve mobility, coordination, and reduce risk for falls to improve safety with household and community mobility and facilitate participation in leisure/recreation for greater quality of life and self-efficacy.     OBJECTIVE IMPAIRMENTS Abnormal gait, decreased activity tolerance, decreased balance, decreased coordination, decreased mobility, difficulty walking, decreased ROM, decreased strength, impaired flexibility, improper body mechanics, and postural dysfunction.   ACTIVITY LIMITATIONS carrying, lifting, bending, squatting, transfers, self feeding, and locomotion level  PARTICIPATION LIMITATIONS: cleaning, interpersonal relationship, shopping, community activity, yard work, and leisure activities  [place] Age, Time since onset of injury/illness/exacerbation, and 1 comorbidity: PD, TKR  are also affecting patient's functional outcome.   REHAB POTENTIAL: Good  CLINICAL DECISION MAKING: Evolving/moderate complexity  EVALUATION COMPLEXITY: Moderate  PLAN: PT FREQUENCY:  2x/week  PT DURATION: 6 weeks  PLANNED INTERVENTIONS: Therapeutic exercises, Therapeutic activity, Neuromuscular re-education, Balance training, Gait training, Patient/Family education, Self Care, Joint mobilization, Stair training, Vestibular training, Canalith repositioning, Orthotic/Fit training, DME instructions, Aquatic Therapy, Dry Needling, Electrical stimulation, Cryotherapy, Moist heat, Taping, and Manual therapy  PLAN FOR NEXT SESSION: floor transfer, throwing coordination assessment   9:23 AM, 03/09/22 M. Sherlyn Lees, PT, DPT Physical Therapist- [place] Office Number: [phone]

## 2022-03-09 NOTE — Therapy (Signed)
OUTPATIENT OCCUPATIONAL THERAPY NEURO EVALUATION  Patient Name: Matthew [name] MRN: [id number] DOB:10-22-1950, 71 y.o., male Today's Date: 03/09/2022  PCP: Lawerance Cruel, MD REFERRING PROVIDER: Carles Collet Eustace Quail, DO    OT End of Session - 03/09/22 1540     Visit Number 1    Number of Visits 17    Date for OT Re-Evaluation 05/05/22    Authorization Type [employer]    OT Start Time 1455    OT Stop Time 5366    OT Time Calculation (min) 40 min             Past Medical History:  Diagnosis Date   Anxiety    Arthritis    GERD (gastroesophageal reflux disease)    occ remote history   Hypertension    Parkinson disease ([place])    Prostate cancer ([place])    Right knee pain    questionable meniscal tear   Seasonal allergies    Past Surgical History:  Procedure Laterality Date   COLONOSCOPY     Leg Laceration Repair  Left    Age 44   PELVIC LYMPH NODE DISSECTION Bilateral 03/22/2018   Procedure: PELVIC LYMPH NODE DISSECTION;  Surgeon: Ceasar Mons, MD;  [facility];  Service: Urology;  Laterality: Bilateral;   PROSTATE BIOPSY     ROBOT ASSISTED LAPAROSCOPIC RADICAL PROSTATECTOMY N/A 03/22/2018   Procedure: XI ROBOTIC ASSISTED LAPAROSCOPIC PROSTATECTOMY;  Surgeon: Ceasar Mons, MD;  [facility];  Service: Urology;  Laterality: N/A;   TOTAL KNEE ARTHROPLASTY Right 05/05/2019   Procedure: RIGHT TOTAL KNEE ARTHROPLASTY;  Surgeon: Frederik Pear, MD;  [facility];  Service: Orthopedics;  Laterality: Right;   Patient Active Problem List   Diagnosis Date Noted   S/P TKR (total knee replacement), right 05/05/2019   Osteoarthritis of right knee 05/02/2019   Prostate cancer ([place]) 03/22/2018   Malignant neoplasm of prostate ([place]) 02/27/2018    ONSET DATE: referral 03/02/22  REFERRING DIAG: G20.A2 (ICD-10-CM) - Parkinson's disease without dyskinesia, with fluctuating manifestations   THERAPY DIAG:  Other symptoms and signs  involving the nervous system  Other abnormalities of gait and mobility  Muscle weakness (generalized)  Unsteadiness on feet  Other lack of coordination  Rationale for Evaluation and Treatment Rehabilitation  SUBJECTIVE:   SUBJECTIVE STATEMENT: Pt reports that it seems to take him longer to get things done, particularly changing pants. Pt reports eating is still quite difficult - constantly spilling things off the plate, having to use finger to stabilize food when scooping with R hand, food falling off of utensil, wife assisting with cutting up meats.   Pt accompanied by: self and spouse dropped pt off  PERTINENT HISTORY: anxiety, arthritis, GERD, HTN, R TKR  PRECAUTIONS: Fall  WEIGHT BEARING RESTRICTIONS No  PAIN:  Are you having pain? No  FALLS: Has patient fallen in last 6 months? Yes. Number of falls 1  LIVING ENVIRONMENT: Lives with: lives with their spouse Lives in: House/apartment Stairs: Yes: Internal: full flight of step, but able to stay on main floor without need to navigate steps; and External: 2 steps; on right going up Has following equipment at home: Single point cane, Quad cane small base, Walker - 2 wheeled, Environmental consultant - 4 wheeled, shower chair, and Grab bars  PLOF: Requires assistive device for independence and Needs assistance with ADLs  PATIENT GOALS to get as close to normal as possible  OBJECTIVE:   HAND DOMINANCE: Right  ADLs: Overall ADLs: reports overall slowing  Transfers/ambulation related to ADLs: utilizing 4 wheeled walker for all mobility Eating: eating is still quite difficult - constantly spilling things off the plate, having to use finger to stabilize food when scooping with R hand, food falling off of utensil, wife assisting with cutting up meats.  Grooming: utilizing electric toothbrush and water pick for oral care, difficulty with brushing hair UB Dressing: primarily wearing t-shirts/pull over shirts, occasional assist from wife if in a  hurry or get stuck, very rarely wearing button up shirts anymore - then wife will assist LB Dressing: difficulty with reaching forward towards feet when donning pants, socks, shoes; increased difficulty when crossing leg Toileting: requires momentum to stand up from toilet, reports "struggle" with hygiene Bathing: occasional assist for thoroughness when drying off Tub Shower transfers: Mod I Equipment: Grab bars, Walk in shower, and Long handled sponge   IADLs: Community mobility: Relies on family or friends for transportation Medication management: Is responsible for taking medication in correct dosages at correct time Financial management: Wife completes and did prior  Handwriting: 100% legible, Increased time, and Moderate micrographia  MOBILITY STATUS: Hx of falls and difficulty carrying objections with ambulation  POSTURE COMMENTS:  forward head  ACTIVITY TOLERANCE: Activity tolerance: WNL for tasks assessed during eval  FUNCTIONAL OUTCOME MEASURES: Physical performance test: PPT #2 (self-feeding):17.66 sec *     PPT #4 (jacket on/off): Attempted but pt unable to complete due to increased lightheadedness in standing and difficulty with previously educated "cape technique"     3 button/unbutton: 2:56.5  UPPER EXTREMITY ROM     Active ROM Right eval Left eval  Shoulder flexion 123 128  Shoulder abduction    Shoulder adduction    Shoulder extension    Shoulder internal rotation Reports mild discomfort in upper arm with position, able to achieve full ROM [facility]  Shoulder external rotation [facility]  Elbow flexion [facility] WFL  Elbow extension    Wrist flexion    Wrist extension    Wrist ulnar deviation    Wrist radial deviation    Wrist pronation    Wrist supination    (Blank rows = not tested)   UPPER EXTREMITY MMT:     MMT Right eval Left eval  Shoulder flexion 4/5 4/5  Shoulder abduction    Shoulder adduction    Shoulder extension    Shoulder internal rotation     Shoulder external rotation    Middle trapezius    Lower trapezius    Elbow flexion 4/5 4/5  Elbow extension 4/5 4/5  Wrist flexion    Wrist extension    Wrist ulnar deviation    Wrist radial deviation    Wrist pronation    Wrist supination    (Blank rows = not tested)   COORDINATION: From screen on 03/02/22 9 Hole Peg test: Right: 49.13 sec; Left: 44.53 sec  MUSCLE TONE: RUE: Mild and LUE: Mild  COGNITION: slower processing overall  VISION: Baseline vision: Bifocals and Wears glasses all the time   TODAY'S TREATMENT:  N/A   PATIENT EDUCATION: Education details: Educated on role and purpose of OT as well as potential interventions and goals for therapy based on initial evaluation findings. Person educated: Patient Education method: Explanation Education comprehension: verbalized understanding   HOME EXERCISE PROGRAM: TBD    GOALS: Goals reviewed with patient? No  SHORT TERM GOALS: Target date: 04/07/22  Pt will be Independent with PD specific HEP Baseline: Goal status: INITIAL  2.  Pt will verbalize understanding of  adapted strategies and DME/AE PRN (reacher, rocker knife, scoop plate/plate guard, etc) to maximize safety and independence with ADLs/IADLs. Baseline:  Goal status: INITIAL  3.  Pt will demonstrate improved shoulder flexion bilaterally to retreive a lightweight object from overhead shelf with BUE at >/= 130 shoulder flexion. Baseline:  Goal status: INITIAL  LONG TERM GOALS: Target date: 05/05/22  Pt will demonstrate increased functional ROM and use of BUE to aid in clothing management and hygiene with toileting tasks. Baseline:  Goal status: INITIAL  2.  Pt will demonstrate improved fine motor coordination for ADLs as evidenced by decreasing 9 hole peg test score for RUE by 3 secs  Baseline: 9 Hole Peg test: Right: 49.13 sec; Left: 44.53 sec Goal status: INITIAL  3.  Pt will demonstrate increased ease with dressing as evidenced by  decreasing PPT#4(don/ doff jacket) by 30 seconds  Baseline: time TBA at next session Goal status: INITIAL  4.  Pt will demonstrate improved ease with feeding as evidenced by decreasing PPT#2 (self feeding) by 3 secs  Baseline: 17.66 sec Goal status: INITIAL  5. Pt will demonstrate and/or report improved independence with LB dressing with use of AE/DME as needed.   ASSESSMENT:  CLINICAL IMPRESSION: Patient is a 71 y.o. male who was seen today for occupational therapy evaluation and treatment for Parkinson's Disease. Pt seen last week for screen with both pt and spouse reporting that it seems to take him longer to get things done, particularly with dressing tasks and self-feeding. Pt currently lives with spouse in a 2 story home with pt able to remain on main floor, with 2 steps to enter through garage. Pt will benefit from skilled occupational therapy services to address strength and coordination, ROM, balance, GM/FM control, safety awareness, introduction of compensatory strategies/AE prn, and implementation of an HEP to improve participation and safety during ADLs and IADLs.   PERFORMANCE DEFICITS in functional skills including ADLs, IADLs, coordination, tone, ROM, strength, flexibility, FMC, GMC, balance, body mechanics, endurance, decreased knowledge of use of DME, and UE functional use and psychosocial skills including environmental adaptation and routines and behaviors.   IMPAIRMENTS are limiting patient from ADLs and IADLs.   COMORBIDITIES may have co-morbidities  that affects occupational performance. Patient will benefit from skilled OT to address above impairments and improve overall function.  MODIFICATION OR ASSISTANCE TO COMPLETE EVALUATION: Min-Moderate modification of tasks or assist with assess necessary to complete an evaluation.  OT OCCUPATIONAL PROFILE AND HISTORY: Detailed assessment: Review of records and additional review of physical, cognitive, psychosocial history  related to current functional performance.  CLINICAL DECISION MAKING: Moderate - several treatment options, min-mod task modification necessary  REHAB POTENTIAL: Good  EVALUATION COMPLEXITY: Moderate    PLAN: OT FREQUENCY: 2x/week  OT DURATION: 8 weeks  PLANNED INTERVENTIONS: self care/ADL training, therapeutic exercise, therapeutic activity, neuromuscular re-education, manual therapy, passive range of motion, balance training, functional mobility training, ultrasound, compression bandaging, moist heat, cryotherapy, patient/family education, cognitive remediation/compensation, energy conservation, and DME and/or AE instructions  RECOMMENDED OTHER SERVICES: N/A  CONSULTED AND AGREED WITH PLAN OF CARE: Patient  PLAN FOR NEXT SESSION: Initiate large amplitude HEP, bag exercises for dressing tasks, introduce AE/DME for increased safety/independence with bathing/dressing tasks.   Simonne Come, OTR/L 03/09/2022, 3:41 PM

## 2022-03-10 NOTE — Therapy (Signed)
OUTPATIENT PHYSICAL THERAPY NEURO TREATMENT   Patient Name: Matthew [name] MRN: [id number] DOB:June 01, 1950, 71 y.o., male Today's Date: 03/09/2022   PCP: Daisy Floro, MD REFERRING PROVIDER: Arbutus Leas Octaviano Batty, DO    PT End of Session - 03/13/22 1704     Visit Number 2    Number of Visits 12    Date for PT Re-Evaluation 04/20/22    Authorization Type [employer]    Progress Note Due on Visit 10    PT Start Time 1622    PT Stop Time 1702    PT Time Calculation (min) 40 min    Activity Tolerance Patient tolerated treatment well    Behavior During Therapy WFL for tasks assessed/performed              Past Medical History:  Diagnosis Date   Anxiety    Arthritis    GERD (gastroesophageal reflux disease)    occ remote history   Hypertension    Parkinson disease    Prostate cancer (HCC)    Right knee pain    questionable meniscal tear   Seasonal allergies    Past Surgical History:  Procedure Laterality Date   COLONOSCOPY     Leg Laceration Repair  Left    Age 72   PELVIC LYMPH NODE DISSECTION Bilateral 03/22/2018   Procedure: PELVIC LYMPH NODE DISSECTION;  Surgeon: Rene Paci, MD;  Location: [facility];  Service: Urology;  Laterality: Bilateral;   PROSTATE BIOPSY     ROBOT ASSISTED LAPAROSCOPIC RADICAL PROSTATECTOMY N/A 03/22/2018   Procedure: XI ROBOTIC ASSISTED LAPAROSCOPIC PROSTATECTOMY;  Surgeon: Rene Paci, MD;  Location: [facility];  Service: Urology;  Laterality: N/A;   TOTAL KNEE ARTHROPLASTY Right 05/05/2019   Procedure: RIGHT TOTAL KNEE ARTHROPLASTY;  Surgeon: Gean Birchwood, MD;  Location: [facility];  Service: Orthopedics;  Laterality: Right;   Patient Active Problem List   Diagnosis Date Noted   S/P TKR (total knee replacement), right 05/05/2019   Osteoarthritis of right knee 05/02/2019   Prostate cancer (HCC) 03/22/2018   Malignant neoplasm of prostate (HCC) 02/27/2018    ONSET DATE: 2-3 years  REFERRING DIAG: G20.A2  (ICD-10-CM) - Parkinson's disease without dyskinesia, with fluctuating manifestations   THERAPY DIAG:  Other symptoms and signs involving the nervous system  Other abnormalities of gait and mobility  Muscle weakness (generalized)  Unsteadiness on feet  Other lack of coordination  Rationale for Evaluation and Treatment Rehabilitation  SUBJECTIVE:  SUBJECTIVE STATEMENT: Confused because he thought he had OT today. Had a fall and could not get up on his own so he was on the floor for an hour- this was months ago. Denies recent falls. Always feels a little bit dizzy- not bad. BP has been around 135/74 recently.   Pt accompanied by: self  PERTINENT HISTORY: Parkinson's, right TKR,   PAIN:  Are you having pain? No  PRECAUTIONS: None  WEIGHT BEARING RESTRICTIONS No  FALLS: Has patient fallen in last 6 months? Yes. Number of falls 2-3 , unable to perform fall recovery  LIVING ENVIRONMENT: Lives with: lives with their spouse Lives in: House/apartment Stairs: Yes: Internal: 16 steps; can reach both and External: 2-3 steps; on left going up Has following equipment at home: Walker - 4 wheeled  PLOF: Independent with household mobility with device  PATIENT GOALS "get close to normal as possible"  -Be able to walk without fear of falling -Be able to throw horseshoes/darts -Be able to maintain walking program with trekking poles   OBJECTIVE:      TODAY'S TREATMENT: 03/13/22 Activity Comments  Nustep L4 x 6 min Ues/LEs Maintaining 70s SPM; cueing to increase amplitude   sitting trunk flexion + big stretch in midline 10x each Cueing for larger amplitude movements   Mini squat at counter 2x10  Cueing to shift hips posteriorly   STS + green medball 10x  Cuess to stand fully and loop straight  ahead upon standing   R/L hip abduction 10x each Visual target to maintain posture tall;   Mini squat 2x10 Cues to shift weight posteriorly   Backwards walking Along counter; cues to increase step length     PATIENT EDUCATION: Education details: reviewed patient's HEP binder of PWR moves; HEP update Person educated: Patient Education method: Explanation, Demonstration, Tactile cues, Verbal cues, and Handouts Education comprehension: verbalized understanding and returned demonstration    Below measures were taken at time of initial evaluation unless otherwise specified:   DIAGNOSTIC FINDINGS: none recently  COGNITION: Overall cognitive status: Within functional limits for tasks assessed   SENSATION: WFL  COORDINATION: Grossly intact with some deficits in rapid, alternating and divided movement tasks  EDEMA:    MUSCLE TONE: some rigidity noted in BLE   MUSCLE LENGTH: Knee flexion contractures  DTRs:    POSTURE: forward head  LOWER EXTREMITY ROM:     Active  Right Eval Left Eval  Hip flexion    Hip extension    Hip abduction    Hip adduction    Hip internal rotation    Hip external rotation    Knee flexion    Knee extension -10 -10  Ankle dorsiflexion 5 5  Ankle plantarflexion    Ankle inversion    Ankle eversion     (Blank rows = not tested)  LOWER EXTREMITY MMT:    Grossly 4/5 BLE  BED MOBILITY:  NT  TRANSFERS: Assistive device utilized: Quad cane large base and None  Sit to stand: Complete Independence Stand to sit: Complete Independence Chair to chair: Modified independence Floor:  NT , pt report need for total assist at this time  RAMP:  NT  CURB:  Level of Assistance: SBA Assistive device utilized: Environmental consultant - 4 wheeled Curb Comments: increased time for processing and negotiation  STAIRS:  Level of Assistance: Modified Aeronautical engineer Technique: Alternating Pattern  with Bilateral Rails  Number of Stairs:  5   Height of Stairs: 4-6"  Comments:   GAIT:  Gait pattern: shuffling, decreased stride Distance walked:  Assistive device utilized: Environmental consultant - 4 wheeled Level of assistance: SBA Comments:   FUNCTIONAL TESTs:  5 times sit to stand: 11 sec Timed up and go (TUG): 15 sec Mini-BEST test: 14/28 Gait Speed: 16.28 sec = 2.01 ft/sec  PATIENT SURVEYS:     PATIENT EDUCATION: Education details: assessment findings Person educated: Patient Education method: Explanation Education comprehension: verbalized understanding and needs further education   HOME EXERCISE PROGRAM: TBD    GOALS: Goals reviewed with patient? Yes  SHORT TERM GOALS: Target date: 03/31/2022  Patient will be able to perform home program independently for self-management. Baseline: Goal status: IN PROGRESS  2.  Improve safety with mobility per time 12 sec TUG to reduce risk for falls Baseline: 15 sec w/ 4WW Goal status: IN PROGRESS   LONG TERM GOALS: Target date: 04/20/2022  Improve safety and reduce risk for falls with mobility per sore 22/28 Mini-BEST test Baseline: 14/28 Goal status: IN PROGRESS  2.  Demonstrate increased gait speed to 3 ft/sec to improve efficiency with community ambulation  Baseline: 2 ft/sec Goal status: IN PROGRESS  3.  Demo supervision level for floor to stand transfers to improve functional mobility and facilitate participation in PD-specific exercise class Baseline: NT, pt reports total assist at this time Goal status: IN PROGRESS  4.  Demonstrate coordination and ability to perform throwing tasks to facilitate participation in leisure activities (horseshoes and darts) Baseline: reports unable to perform Goal status: IN PROGRESS    ASSESSMENT:  CLINICAL IMPRESSION: Patient arrived to session without new complaints. Reports that BP at home has been going back down to levels that are normal for hip. Denies recent falls. Patient's gait with 4WW appears very forward flexed  and with poor control of speed of walker; may possible try RW instead. Worked on LE strengthening activities with cueing for proper form and larger amplitude movements. Patient reports trouble with car transfers, thus worked on components of car transfers as patient's wife was not present with the car. Patient tolerated session well and reported understanding of HEP.    OBJECTIVE IMPAIRMENTS Abnormal gait, decreased activity tolerance, decreased balance, decreased coordination, decreased mobility, difficulty walking, decreased ROM, decreased strength, impaired flexibility, improper body mechanics, and postural dysfunction.   ACTIVITY LIMITATIONS carrying, lifting, bending, squatting, transfers, self feeding, and locomotion level  PARTICIPATION LIMITATIONS: cleaning, interpersonal relationship, shopping, community activity, yard work, and leisure activities  PERSONAL FACTORS Age, Time since onset of injury/illness/exacerbation, and 1 comorbidity: PD, TKR  are also affecting patient's functional outcome.   REHAB POTENTIAL: Good  CLINICAL DECISION MAKING: Evolving/moderate complexity  EVALUATION COMPLEXITY: Moderate  PLAN: PT FREQUENCY: 2x/week  PT DURATION: 6 weeks  PLANNED INTERVENTIONS: Therapeutic exercises, Therapeutic activity, Neuromuscular re-education, Balance training, Gait training, Patient/Family education, Self Care, Joint mobilization, Stair training, Vestibular training, Canalith repositioning, Orthotic/Fit training, DME instructions, Aquatic Therapy, Dry Needling, Electrical stimulation, Cryotherapy, Moist heat, Taping, and Manual therapy  PLAN FOR NEXT SESSION: trial gait with RW: try car transfer; floor transfer, throwing coordination assessment(defer and will leave for OT)   Anette Guarneri, PT, DPT 03/13/22 5:08 PM  [facility] at [facility] [address] [place], [place] [address] Phone # [phone] Fax # [phone]

## 2022-03-13 ENCOUNTER — Ambulatory Visit: Payer: PPO | Admitting: Physical Therapy

## 2022-03-13 ENCOUNTER — Encounter: Payer: Self-pay | Admitting: Physical Therapy

## 2022-03-13 DIAGNOSIS — R2689 Other abnormalities of gait and mobility: Secondary | ICD-10-CM | POA: Diagnosis not present

## 2022-03-13 DIAGNOSIS — M6281 Muscle weakness (generalized): Secondary | ICD-10-CM

## 2022-03-13 DIAGNOSIS — R29818 Other symptoms and signs involving the nervous system: Secondary | ICD-10-CM

## 2022-03-13 DIAGNOSIS — R278 Other lack of coordination: Secondary | ICD-10-CM

## 2022-03-13 DIAGNOSIS — R2681 Unsteadiness on feet: Secondary | ICD-10-CM

## 2022-03-15 ENCOUNTER — Other Ambulatory Visit (HOSPITAL_COMMUNITY): Payer: Self-pay

## 2022-03-15 NOTE — Therapy (Signed)
OUTPATIENT PHYSICAL THERAPY NEURO TREATMENT   Patient Name: Matthew [name] MRN: [id number] DOB:18-May-1951, 71 y.o., male Today's Date: 03/09/2022   PCP: Daisy Floro, MD REFERRING PROVIDER: Arbutus Leas Octaviano Batty, DO    PT End of Session - 03/16/22 1701     Visit Number 3    Number of Visits 12    Date for PT Re-Evaluation 04/20/22    Authorization Type [employer]    Progress Note Due on Visit 10    PT Start Time 1620    PT Stop Time 1701    PT Time Calculation (min) 41 min    Equipment Utilized During Treatment Gait belt    Activity Tolerance Patient tolerated treatment well    Behavior During Therapy WFL for tasks assessed/performed               Past Medical History:  Diagnosis Date   Anxiety    Arthritis    GERD (gastroesophageal reflux disease)    occ remote history   Hypertension    Parkinson disease    Prostate cancer (HCC)    Right knee pain    questionable meniscal tear   Seasonal allergies    Past Surgical History:  Procedure Laterality Date   COLONOSCOPY     Leg Laceration Repair  Left    Age 51   PELVIC LYMPH NODE DISSECTION Bilateral 03/22/2018   Procedure: PELVIC LYMPH NODE DISSECTION;  Surgeon: Rene Paci, MD;  Location: [facility];  Service: Urology;  Laterality: Bilateral;   PROSTATE BIOPSY     ROBOT ASSISTED LAPAROSCOPIC RADICAL PROSTATECTOMY N/A 03/22/2018   Procedure: XI ROBOTIC ASSISTED LAPAROSCOPIC PROSTATECTOMY;  Surgeon: Rene Paci, MD;  Location: [facility];  Service: Urology;  Laterality: N/A;   TOTAL KNEE ARTHROPLASTY Right 05/05/2019   Procedure: RIGHT TOTAL KNEE ARTHROPLASTY;  Surgeon: Gean Birchwood, MD;  Location: [facility];  Service: Orthopedics;  Laterality: Right;   Patient Active Problem List   Diagnosis Date Noted   S/P TKR (total knee replacement), right 05/05/2019   Osteoarthritis of right knee 05/02/2019   Prostate cancer (HCC) 03/22/2018   Malignant neoplasm of prostate (HCC) 02/27/2018     ONSET DATE: 2-3 years  REFERRING DIAG: G20.A2 (ICD-10-CM) - Parkinson's disease without dyskinesia, with fluctuating manifestations   THERAPY DIAG:  Other symptoms and signs involving the nervous system  Other abnormalities of gait and mobility  Muscle weakness (generalized)  Unsteadiness on feet  Rationale for Evaluation and Treatment Rehabilitation  SUBJECTIVE:  SUBJECTIVE STATEMENT: Had to walk up and down a hill about 1/4 mile and felt like the walker was getting away from him. Wanting to figure out the trekking poles.   Pt accompanied by: self  PERTINENT HISTORY: Parkinson's, right TKR,   PAIN:  Are you having pain? No  PRECAUTIONS: None  WEIGHT BEARING RESTRICTIONS No  FALLS: Has patient fallen in last 6 months? Yes. Number of falls 2-3 , unable to perform fall recovery  LIVING ENVIRONMENT: Lives with: lives with their spouse Lives in: House/apartment Stairs: Yes: Internal: 16 steps; can reach both and External: 2-3 steps; on left going up Has following equipment at home: Walker - 4 wheeled  PLOF: Independent with household mobility with device  PATIENT GOALS "get close to normal as possible"  -Be able to walk without fear of falling -Be able to throw horseshoes/darts -Be able to maintain walking program with trekking poles   OBJECTIVE:     TODAY'S TREATMENT: 03/16/22 Activity Comments  Car transfer x2 Min A and consistent verbal and manual cueing required; patient with difficulty hoisting bottom back and scooting backwards; fairly good ability to lift feet into car  Gait training with RW x168ft Cueing for taller step height, taller posture, sequential turns   Scooting along mat 2x Cueing for proper foot placement for max efficiency   Backwards scooting on mat on  low and high mat for 2x5 Much more difficulty performing at higher mat level           PATIENT EDUCATION: Education details: discussion with patient and wife about decline in patient's function in the last couple months and possible need to f/u with neurologist  Person educated: Patient and Spouse Education method: Explanation Education comprehension: verbalized understanding    Below measures were taken at time of initial evaluation unless otherwise specified:   DIAGNOSTIC FINDINGS: none recently  COGNITION: Overall cognitive status: Within functional limits for tasks assessed   SENSATION: WFL  COORDINATION: Grossly intact with some deficits in rapid, alternating and divided movement tasks  EDEMA:    MUSCLE TONE: some rigidity noted in BLE   MUSCLE LENGTH: Knee flexion contractures  DTRs:    POSTURE: forward head  LOWER EXTREMITY ROM:     Active  Right Eval Left Eval  Hip flexion    Hip extension    Hip abduction    Hip adduction    Hip internal rotation    Hip external rotation    Knee flexion    Knee extension -10 -10  Ankle dorsiflexion 5 5  Ankle plantarflexion    Ankle inversion    Ankle eversion     (Blank rows = not tested)  LOWER EXTREMITY MMT:    Grossly 4/5 BLE  BED MOBILITY:  NT  TRANSFERS: Assistive device utilized: Quad cane large base and None  Sit to stand: Complete Independence Stand to sit: Complete Independence Chair to chair: Modified independence Floor:  NT , pt report need for total assist at this time  RAMP:  NT  CURB:  Level of Assistance: SBA Assistive device utilized: Environmental consultant - 4 wheeled Curb Comments: increased time for processing and negotiation  STAIRS:  Level of Assistance: Modified Aeronautical engineer Technique: Alternating Pattern  with Bilateral Rails  Number of Stairs: 5   Height of Stairs: 4-6"  Comments:   GAIT: Gait pattern: shuffling, decreased stride Distance walked:   Assistive device utilized: Walker - 4 wheeled Level of assistance: SBA Comments:   FUNCTIONAL TESTs:  5  times sit to stand: 11 sec Timed up and go (TUG): 15 sec Mini-BEST test: 14/28 Gait Speed: 16.28 sec = 2.01 ft/sec  PATIENT SURVEYS:     PATIENT EDUCATION: Education details: assessment findings Person educated: Patient Education method: Explanation Education comprehension: verbalized understanding and needs further education   HOME EXERCISE PROGRAM: TBD    GOALS: Goals reviewed with patient? Yes  SHORT TERM GOALS: Target date: 03/31/2022  Patient will be able to perform home program independently for self-management. Baseline: Goal status: IN PROGRESS  2.  Improve safety with mobility per time 12 sec TUG to reduce risk for falls Baseline: 15 sec w/ 4WW Goal status: IN PROGRESS   LONG TERM GOALS: Target date: 04/20/2022  Improve safety and reduce risk for falls with mobility per sore 22/28 Mini-BEST test Baseline: 14/28 Goal status: IN PROGRESS  2.  Demonstrate increased gait speed to 3 ft/sec to improve efficiency with community ambulation  Baseline: 2 ft/sec Goal status: IN PROGRESS  3.  Demo supervision level for floor to stand transfers to improve functional mobility and facilitate participation in PD-specific exercise class Baseline: NT, pt reports total assist at this time Goal status: IN PROGRESS  4.  Demonstrate coordination and ability to perform throwing tasks to facilitate participation in leisure activities (horseshoes and darts) Baseline: reports unable to perform Goal status: IN PROGRESS    ASSESSMENT:  CLINICAL IMPRESSION: Patient arrived to session with report of difficulty controlling his 4WW on hilly terrain. Worked on gait training with RW which appeared slightly more steady however patient still with tendency to flex forward and take short steps, requiring cueing. Worked on car transfers which required minor physical assist and  continuous verbal cueing for proper technique. Patient with most difficulty performing backwards scooting, so practiced this in isolation on mat. Patient tolerated session well and without complaints besides fatigue at end of session.    OBJECTIVE IMPAIRMENTS Abnormal gait, decreased activity tolerance, decreased balance, decreased coordination, decreased mobility, difficulty walking, decreased ROM, decreased strength, impaired flexibility, improper body mechanics, and postural dysfunction.   ACTIVITY LIMITATIONS carrying, lifting, bending, squatting, transfers, self feeding, and locomotion level  PARTICIPATION LIMITATIONS: cleaning, interpersonal relationship, shopping, community activity, yard work, and leisure activities  PERSONAL FACTORS Age, Time since onset of injury/illness/exacerbation, and 1 comorbidity: PD, TKR  are also affecting patient's functional outcome.   REHAB POTENTIAL: Good  CLINICAL DECISION MAKING: Evolving/moderate complexity  EVALUATION COMPLEXITY: Moderate  PLAN: PT FREQUENCY: 2x/week  PT DURATION: 6 weeks  PLANNED INTERVENTIONS: Therapeutic exercises, Therapeutic activity, Neuromuscular re-education, Balance training, Gait training, Patient/Family education, Self Care, Joint mobilization, Stair training, Vestibular training, Canalith repositioning, Orthotic/Fit training, DME instructions, Aquatic Therapy, Dry Needling, Electrical stimulation, Cryotherapy, Moist heat, Taping, and Manual therapy  PLAN FOR NEXT SESSION: continue gait with RW: floor transfer, throwing coordination assessment(defer and will leave for OT)   Anette Guarneri, PT, DPT 03/16/22 5:08 PM  [facility] at [facility] [address] [place], [place] [address] Phone # [phone] Fax # [phone]

## 2022-03-16 ENCOUNTER — Ambulatory Visit: Payer: PPO | Admitting: Physical Therapy

## 2022-03-16 ENCOUNTER — Encounter: Payer: Self-pay | Admitting: Physical Therapy

## 2022-03-16 DIAGNOSIS — R29818 Other symptoms and signs involving the nervous system: Secondary | ICD-10-CM

## 2022-03-16 DIAGNOSIS — M6281 Muscle weakness (generalized): Secondary | ICD-10-CM

## 2022-03-16 DIAGNOSIS — R2681 Unsteadiness on feet: Secondary | ICD-10-CM

## 2022-03-16 DIAGNOSIS — R2689 Other abnormalities of gait and mobility: Secondary | ICD-10-CM | POA: Diagnosis not present

## 2022-03-20 ENCOUNTER — Ambulatory Visit: Payer: PPO | Admitting: Occupational Therapy

## 2022-03-20 DIAGNOSIS — R2689 Other abnormalities of gait and mobility: Secondary | ICD-10-CM | POA: Diagnosis not present

## 2022-03-20 DIAGNOSIS — R278 Other lack of coordination: Secondary | ICD-10-CM

## 2022-03-20 DIAGNOSIS — R29818 Other symptoms and signs involving the nervous system: Secondary | ICD-10-CM

## 2022-03-20 DIAGNOSIS — R2681 Unsteadiness on feet: Secondary | ICD-10-CM

## 2022-03-20 DIAGNOSIS — M6281 Muscle weakness (generalized): Secondary | ICD-10-CM

## 2022-03-20 NOTE — Therapy (Signed)
OUTPATIENT OCCUPATIONAL THERAPY NEURO EVALUATION  Patient Name: Jalmer Tooker MRN: [id number] DOB:[birth year], 71 y.o., male Today's Date: 03/20/2022  PCP: Daisy Floro, MD REFERRING PROVIDER: Arbutus Leas Octaviano Batty, DO    OT End of Session - 03/20/22 1319     Visit Number 2    Number of Visits 17    Date for OT Re-Evaluation 05/05/22    Authorization Type [employer]    OT Start Time 1318    OT Stop Time 1400    OT Time Calculation (min) 42 min              Past Medical History:  Diagnosis Date   Anxiety    Arthritis    GERD (gastroesophageal reflux disease)    occ remote history   Hypertension    Parkinson disease    Prostate cancer (HCC)    Right knee pain    questionable meniscal tear   Seasonal allergies    Past Surgical History:  Procedure Laterality Date   COLONOSCOPY     Leg Laceration Repair  Left    Age 40   PELVIC LYMPH NODE DISSECTION Bilateral 03/22/2018   Procedure: PELVIC LYMPH NODE DISSECTION;  Surgeon: Rene Paci, MD;  Location: [facility];  Service: Urology;  Laterality: Bilateral;   PROSTATE BIOPSY     ROBOT ASSISTED LAPAROSCOPIC RADICAL PROSTATECTOMY N/A 03/22/2018   Procedure: XI ROBOTIC ASSISTED LAPAROSCOPIC PROSTATECTOMY;  Surgeon: Rene Paci, MD;  Location: [facility];  Service: Urology;  Laterality: N/A;   TOTAL KNEE ARTHROPLASTY Right 05/05/2019   Procedure: RIGHT TOTAL KNEE ARTHROPLASTY;  Surgeon: Gean Birchwood, MD;  Location: [facility];  Service: Orthopedics;  Laterality: Right;   Patient Active Problem List   Diagnosis Date Noted   S/P TKR (total knee replacement), right 05/05/2019   Osteoarthritis of right knee 05/02/2019   Prostate cancer (HCC) 03/22/2018   Malignant neoplasm of prostate (HCC) 02/27/2018    ONSET DATE: referral 03/02/22  REFERRING DIAG: G20.A2 (ICD-10-CM) - Parkinson's disease without dyskinesia, with fluctuating manifestations   THERAPY DIAG:  Other symptoms and signs involving  the nervous system  Muscle weakness (generalized)  Unsteadiness on feet  Other lack of coordination  Other abnormalities of gait and mobility  Rationale for Evaluation and Treatment Rehabilitation  SUBJECTIVE:   SUBJECTIVE STATEMENT: Pt reports that he asked the PT last week about UE exercises.   Pt accompanied by: self and spouse dropped pt off  PERTINENT HISTORY: anxiety, arthritis, GERD, HTN, R TKR  PRECAUTIONS: Fall  WEIGHT BEARING RESTRICTIONS No  PAIN:  Are you having pain? No  FALLS: Has patient fallen in last 6 months? Yes. Number of falls 1  LIVING ENVIRONMENT: Lives with: lives with their spouse Lives in: House/apartment Stairs: Yes: Internal: full flight of step, but able to stay on main floor without need to navigate steps; and External: 2 steps; on right going up Has following equipment at home: Single point cane, Quad cane small base, Walker - 2 wheeled, Environmental consultant - 4 wheeled, shower chair, and Grab bars  PLOF: Requires assistive device for independence and Needs assistance with ADLs  PATIENT GOALS to get as close to normal as possible  OBJECTIVE:   HAND DOMINANCE: Right   FUNCTIONAL OUTCOME MEASURES: Physical performance test: PPT #2 (self-feeding):17.66 sec *     PPT #4 (jacket on/off): Attempted but pt unable to complete due to increased lightheadedness in standing and difficulty with previously educated "cape technique"     3 button/unbutton: 2:56.5  COORDINATION: From screen on 03/02/22 9 Hole Peg test: Right: 49.13 sec; Left: 44.53 sec  MUSCLE TONE: RUE: Mild and LUE: Mild  COGNITION: slower processing overall  VISION: Baseline vision: Bifocals and Wears glasses all the time   TODAY'S TREATMENT:  Exercise classes: PT provided pt with handout for chair level PWR! Moves class.  Pt reports that he was no longer going to the PWR! Moves class at [facility] as he was having increased difficulty with getting up off the floor.  Discussed  recommendation for chair level class.  Pt does report that he does a weekly dance class on Zoom.  Reviewed goals of dance class with large amplitude UE movements. Donning jacket: pt attempted to don jacket, taking 43.6 sec but still unable to get hospital gown around shoulders due to decreased UE movements. Bag exercises: Mod cues with demosntration for large amplitude as UE drooping with repetition/fatigue. Encouraged use of mirror for increased feedback of UE positioning.  Passing bag in front, behind, and overhead to simulate UB dressing and increase ROM as needed for donning jacket and overhead shirt. Discussed local community and online resources and provided with handout.   PATIENT EDUCATION: Education details: Educated on role and purpose of OT as well as potential interventions and goals for therapy based on initial evaluation findings. Person educated: Patient Education method: Explanation Education comprehension: verbalized understanding   HOME EXERCISE PROGRAM: Bag exercises - see pt instructions    GOALS: Goals reviewed with patient? Yes  SHORT TERM GOALS: Target date: 04/07/22  Pt will be Independent with PD specific HEP Baseline: Goal status: IN PROGRESS  2.  Pt will verbalize understanding of adapted strategies and DME/AE PRN (reacher, rocker knife, scoop plate/plate guard, etc) to maximize safety and independence with ADLs/IADLs. Baseline:  Goal status: IN PROGRESS  3.  Pt will demonstrate improved shoulder flexion bilaterally to retreive a lightweight object from overhead shelf with BUE at >/= 130 shoulder flexion. Baseline:  Goal status: IN PROGRESS  LONG TERM GOALS: Target date: 05/05/22  Pt will demonstrate increased functional ROM and use of BUE to aid in clothing management and hygiene with toileting tasks. Baseline:  Goal status: IN PROGRESS  2.  Pt will demonstrate improved fine motor coordination for ADLs as evidenced by decreasing 9 hole peg test  score for RUE by 3 secs  Baseline: 9 Hole Peg test: Right: 49.13 sec; Left: 44.53 sec Goal status: IN PROGRESS  3.  Pt will demonstrate increased ease with dressing as evidenced by decreasing PPT#4(don/ doff jacket) by 30 seconds  Baseline: time TBA at next session Goal status: IN PROGRESS  4.  Pt will demonstrate improved ease with feeding as evidenced by decreasing PPT#2 (self feeding) by 3 secs  Baseline: 17.66 sec Goal status: IN PROGRESS  5. Pt will demonstrate and/or report improved independence with LB dressing with use of AE/DME as needed.  Baseline:  Goal status: IN PROGRESS   ASSESSMENT:  CLINICAL IMPRESSION: Pt seen for first treatment session s/p initial evaluation. OT reviewed POC and established goals with pt reporting understanding.  Pt continues to demonstrate significantly impaired UE ROM impacting engagement in UB dressing.  Responsive to bag exercises with focus on large amplitude, will benefit from completing in front of mirror for increased feedback.  PERFORMANCE DEFICITS in functional skills including ADLs, IADLs, coordination, tone, ROM, strength, flexibility, FMC, GMC, balance, body mechanics, endurance, decreased knowledge of use of DME, and UE functional use and psychosocial skills including environmental adaptation and routines and  behaviors.   IMPAIRMENTS are limiting patient from ADLs and IADLs.   COMORBIDITIES may have co-morbidities  that affects occupational performance. Patient will benefit from skilled OT to address above impairments and improve overall function.  MODIFICATION OR ASSISTANCE TO COMPLETE EVALUATION: Min-Moderate modification of tasks or assist with assess necessary to complete an evaluation.  OT OCCUPATIONAL PROFILE AND HISTORY: Detailed assessment: Review of records and additional review of physical, cognitive, psychosocial history related to current functional performance.  CLINICAL DECISION MAKING: Moderate - several treatment  options, min-mod task modification necessary  REHAB POTENTIAL: Good  EVALUATION COMPLEXITY: Moderate    PLAN: OT FREQUENCY: 2x/week  OT DURATION: 8 weeks  PLANNED INTERVENTIONS: self care/ADL training, therapeutic exercise, therapeutic activity, neuromuscular re-education, manual therapy, passive range of motion, balance training, functional mobility training, ultrasound, compression bandaging, moist heat, cryotherapy, patient/family education, cognitive remediation/compensation, energy conservation, and DME and/or AE instructions  RECOMMENDED OTHER SERVICES: N/A  CONSULTED AND AGREED WITH PLAN OF CARE: Patient  PLAN FOR NEXT SESSION: Initiate large amplitude HEP, Review and add to bag exercises for dressing tasks - complete in front of mirror for feedback, introduce AE/DME for increased safety/independence with bathing/dressing tasks.   Xyler Terpening, OTR/L 03/20/2022, 1:20 PM

## 2022-03-20 NOTE — Patient Instructions (Signed)
Bag Exercises:  Small trash bag or produce bag works best.  For all exercises, sit with big posture (sit up tall with head up) and use big movements. Perform the following exercises 1-2 times per day.  Hold bag in one hand. Stretch both arms/hands out to the side as big as you can. Then, pass bag from one hand to the other IN FRONT of you. Stretch arms back out big after each pass. Repeat 10 times. Hold bag in one hand. Stretch both arms/hands out to the side as big as you can. Then, pass bag from one hand to the other BEHIND you. Stretch arms back out big after each pass. Repeat 10 times. Hold bag in one hand. Stretch both arms/hands out to the side in a diagonal as big as you can. Then, pass bag from one hand to the other IN FRONT of you in a diagonal pattern. Stretch arms back out big after each pass. Repeat 10 times. Hold bag in both hands in front of you with hands/arms shoulder length apart. Move bag behind your head. Repeat 10 times. Hold bag in both hands in front of you with hands/arms shoulder length apart. Lift leg and move bag completely under each foot and back. Repeat 10 times on each side. Hold bag in right hand. Move right hand to reach behind shoulder. Then, reach behind back with left hand to pass bag from right hand to left hand. Switch sides. Repeat 10 times on each side.

## 2022-03-21 ENCOUNTER — Other Ambulatory Visit (HOSPITAL_COMMUNITY): Payer: Self-pay

## 2022-03-21 NOTE — Therapy (Signed)
OUTPATIENT PHYSICAL THERAPY NEURO TREATMENT   Patient Name: Matthew [name] MRN: [id number] DOB:1950/07/14, 71 y.o., male Today's Date: 03/09/2022   PCP: Lawerance Cruel, MD REFERRING PROVIDER: Tat, Eustace Quail, DO        Past Medical History:  Diagnosis Date   Anxiety    Arthritis    GERD (gastroesophageal reflux disease)    occ remote history   Hypertension    Parkinson disease    Prostate cancer ([place])    Right knee pain    questionable meniscal tear   Seasonal allergies    Past Surgical History:  Procedure Laterality Date   COLONOSCOPY     Leg Laceration Repair  Left    Age 39   PELVIC LYMPH NODE DISSECTION Bilateral 03/22/2018   Procedure: PELVIC LYMPH NODE DISSECTION;  Surgeon: Ceasar Mons, MD;  Location: [facility];  Service: Urology;  Laterality: Bilateral;   PROSTATE BIOPSY     ROBOT ASSISTED LAPAROSCOPIC RADICAL PROSTATECTOMY N/A 03/22/2018   Procedure: XI ROBOTIC ASSISTED LAPAROSCOPIC PROSTATECTOMY;  Surgeon: Ceasar Mons, MD;  Location: [facility];  Service: Urology;  Laterality: N/A;   TOTAL KNEE ARTHROPLASTY Right 05/05/2019   Procedure: RIGHT TOTAL KNEE ARTHROPLASTY;  Surgeon: Frederik Pear, MD;  Location: [facility];  Service: Orthopedics;  Laterality: Right;   Patient Active Problem List   Diagnosis Date Noted   S/P TKR (total knee replacement), right 05/05/2019   Osteoarthritis of right knee 05/02/2019   Prostate cancer ([place]) 03/22/2018   Malignant neoplasm of prostate ([place]) 02/27/2018    ONSET DATE: 2-3 years  REFERRING DIAG: G20.A2 (ICD-10-CM) - Parkinson's disease without dyskinesia, with fluctuating manifestations   THERAPY DIAG:  No diagnosis found.  Rationale for Evaluation and Treatment Rehabilitation  SUBJECTIVE:                                                                                                                                                                                              SUBJECTIVE  STATEMENT: Had to walk up and down a hill about 1/4 mile and felt like the walker was getting away from him. Wanting to figure out the trekking poles.   Pt accompanied by: self  PERTINENT HISTORY: Parkinson's, right TKR,   PAIN:  Are you having pain? No  PRECAUTIONS: None  WEIGHT BEARING RESTRICTIONS No  FALLS: Has patient fallen in last 6 months? Yes. Number of falls 2-3 , unable to perform fall recovery  LIVING ENVIRONMENT: Lives with: lives with their spouse Lives in: House/apartment Stairs: Yes: Internal: 16 steps; can reach both and External: 2-3 steps; on left going up Has following equipment  at home: Gilford Rile - 4 wheeled  PLOF: Independent with household mobility with device  PATIENT GOALS "get close to normal as possible"  -Be able to walk without fear of falling -Be able to throw horseshoes/darts -Be able to maintain walking program with trekking poles   OBJECTIVE:     TODAY'S TREATMENT: 03/22/22 Activity Comments                        Below measures were taken at time of initial evaluation unless otherwise specified:   DIAGNOSTIC FINDINGS: none recently  COGNITION: Overall cognitive status: Within functional limits for tasks assessed   SENSATION: WFL  COORDINATION: Grossly intact with some deficits in rapid, alternating and divided movement tasks  EDEMA:    MUSCLE TONE: some rigidity noted in BLE   MUSCLE LENGTH: Knee flexion contractures  DTRs:    POSTURE: forward head  LOWER EXTREMITY ROM:     Active  Right Eval Left Eval  Hip flexion    Hip extension    Hip abduction    Hip adduction    Hip internal rotation    Hip external rotation    Knee flexion    Knee extension -10 -10  Ankle dorsiflexion 5 5  Ankle plantarflexion    Ankle inversion    Ankle eversion     (Blank rows = not tested)  LOWER EXTREMITY MMT:    Grossly 4/5 BLE  BED MOBILITY:  NT  TRANSFERS: Assistive device utilized: Quad cane large base  and None  Sit to stand: Complete Independence Stand to sit: Complete Independence Chair to chair: Modified independence Floor:  NT , pt report need for total assist at this time  RAMP:  NT  CURB:  Level of Assistance: SBA Assistive device utilized: Environmental consultant - 4 wheeled Curb Comments: increased time for processing and negotiation  STAIRS:  Level of Assistance: Modified Artist Technique: Alternating Pattern  with Bilateral Rails  Number of Stairs: 5   Height of Stairs: 4-6"  Comments:   GAIT: Gait pattern: shuffling, decreased stride Distance walked:  Assistive device utilized: Environmental consultant - 4 wheeled Level of assistance: SBA Comments:   FUNCTIONAL TESTs:  5 times sit to stand: 11 sec Timed up and go (TUG): 15 sec Mini-BEST test: 14/28 Gait Speed: 16.28 sec = 2.01 ft/sec  PATIENT SURVEYS:     PATIENT EDUCATION: Education details: assessment findings Person educated: Patient Education method: Explanation Education comprehension: verbalized understanding and needs further education   HOME EXERCISE PROGRAM: TBD    GOALS: Goals reviewed with patient? Yes  SHORT TERM GOALS: Target date: 03/31/2022  Patient will be able to perform home program independently for self-management. Baseline: Goal status: IN PROGRESS  2.  Improve safety with mobility per time 12 sec TUG to reduce risk for falls Baseline: 15 sec w/ 4WW Goal status: IN PROGRESS   LONG TERM GOALS: Target date: 04/20/2022  Improve safety and reduce risk for falls with mobility per sore 22/28 Mini-BEST test Baseline: 14/28 Goal status: IN PROGRESS  2.  Demonstrate increased gait speed to 3 ft/sec to improve efficiency with community ambulation  Baseline: 2 ft/sec Goal status: IN PROGRESS  3.  Demo supervision level for floor to stand transfers to improve functional mobility and facilitate participation in PD-specific exercise class Baseline: NT, pt reports total assist at  this time Goal status: IN PROGRESS  4.  Demonstrate coordination and ability to perform throwing tasks to facilitate participation in leisure  activities (horseshoes and darts) Baseline: reports unable to perform Goal status: IN PROGRESS    ASSESSMENT:  CLINICAL IMPRESSION: Patient arrived to session with report of difficulty controlling his 4WW on hilly terrain. Worked on gait training with RW which appeared slightly more steady however patient still with tendency to flex forward and take short steps, requiring cueing. Worked on car transfers which required minor physical assist and continuous verbal cueing for proper technique. Patient with most difficulty performing backwards scooting, so practiced this in isolation on mat. Patient tolerated session well and without complaints besides fatigue at end of session.    OBJECTIVE IMPAIRMENTS Abnormal gait, decreased activity tolerance, decreased balance, decreased coordination, decreased mobility, difficulty walking, decreased ROM, decreased strength, impaired flexibility, improper body mechanics, and postural dysfunction.   ACTIVITY LIMITATIONS carrying, lifting, bending, squatting, transfers, self feeding, and locomotion level  PARTICIPATION LIMITATIONS: cleaning, interpersonal relationship, shopping, community activity, yard work, and leisure activities  [place] Age, Time since onset of injury/illness/exacerbation, and 1 comorbidity: PD, TKR  are also affecting patient's functional outcome.   REHAB POTENTIAL: Good  CLINICAL DECISION MAKING: Evolving/moderate complexity  EVALUATION COMPLEXITY: Moderate  PLAN: PT FREQUENCY: 2x/week  PT DURATION: 6 weeks  PLANNED INTERVENTIONS: Therapeutic exercises, Therapeutic activity, Neuromuscular re-education, Balance training, Gait training, Patient/Family education, Self Care, Joint mobilization, Stair training, Vestibular training, Canalith repositioning, Orthotic/Fit training, DME  instructions, Aquatic Therapy, Dry Needling, Electrical stimulation, Cryotherapy, Moist heat, Taping, and Manual therapy  PLAN FOR NEXT SESSION: continue gait with RW: floor transfer, throwing coordination assessment(defer and will leave for OT)   Janene Harvey, PT, DPT 03/21/22 1:47 PM  [facility] [address] [place], [place] [address] Phone # [phone] Fax # [phone]

## 2022-03-22 ENCOUNTER — Ambulatory Visit: Payer: PPO | Attending: Neurology | Admitting: Physical Therapy

## 2022-03-22 ENCOUNTER — Encounter: Payer: Self-pay | Admitting: Physical Therapy

## 2022-03-22 DIAGNOSIS — R2689 Other abnormalities of gait and mobility: Secondary | ICD-10-CM | POA: Insufficient documentation

## 2022-03-22 DIAGNOSIS — G20A2 Parkinson's disease without dyskinesia, with fluctuations: Secondary | ICD-10-CM | POA: Diagnosis not present

## 2022-03-22 DIAGNOSIS — R29818 Other symptoms and signs involving the nervous system: Secondary | ICD-10-CM | POA: Insufficient documentation

## 2022-03-22 DIAGNOSIS — R293 Abnormal posture: Secondary | ICD-10-CM | POA: Insufficient documentation

## 2022-03-22 DIAGNOSIS — R29898 Other symptoms and signs involving the musculoskeletal system: Secondary | ICD-10-CM | POA: Diagnosis not present

## 2022-03-22 DIAGNOSIS — M6281 Muscle weakness (generalized): Secondary | ICD-10-CM | POA: Diagnosis present

## 2022-03-22 DIAGNOSIS — R278 Other lack of coordination: Secondary | ICD-10-CM | POA: Insufficient documentation

## 2022-03-22 DIAGNOSIS — R2681 Unsteadiness on feet: Secondary | ICD-10-CM | POA: Insufficient documentation

## 2022-03-23 ENCOUNTER — Ambulatory Visit: Payer: PPO | Admitting: Occupational Therapy

## 2022-03-23 DIAGNOSIS — R2689 Other abnormalities of gait and mobility: Secondary | ICD-10-CM

## 2022-03-23 DIAGNOSIS — R29818 Other symptoms and signs involving the nervous system: Secondary | ICD-10-CM

## 2022-03-23 DIAGNOSIS — R278 Other lack of coordination: Secondary | ICD-10-CM

## 2022-03-23 DIAGNOSIS — M6281 Muscle weakness (generalized): Secondary | ICD-10-CM

## 2022-03-23 DIAGNOSIS — R2681 Unsteadiness on feet: Secondary | ICD-10-CM

## 2022-03-23 NOTE — Patient Instructions (Signed)
Stretches to aid in Lower Body dressing: Cross leg over opposite knee. Place same hand as leg on knee and give gentle press to provide stretch in hip.  Hold 20-30 seconds.  Complete on both sides. Do that 4-5 times. Bend knee and hip to lift foot as high as possible from the ground.  Complete x10.  Complete on both sides Reach forward towards foot (as if putting pants on foot) x10.  Complete on both sides. As these exercises improve, attempt bag exercise to include lifting foot while reaching towards foot to advance bag under foot.

## 2022-03-23 NOTE — Therapy (Signed)
OUTPATIENT OCCUPATIONAL THERAPY  Treatment Note  Patient Name: Matthew [name] MRN: [id number] DOB:[birth year], 71 y.o., male Today's Date: 03/23/2022  PCP: Lawerance Cruel, MD REFERRING PROVIDER: Carles Collet Eustace Quail, DO    OT End of Session - 03/23/22 1032     Visit Number 3    Number of Visits 17    Date for OT Re-Evaluation 05/05/22    Authorization Type [employer]    OT Start Time 1029   pt arrival time   OT Stop Time 1101    OT Time Calculation (min) 32 min               Past Medical History:  Diagnosis Date   Anxiety    Arthritis    GERD (gastroesophageal reflux disease)    occ remote history   Hypertension    Parkinson disease    Prostate cancer ([place])    Right knee pain    questionable meniscal tear   Seasonal allergies    Past Surgical History:  Procedure Laterality Date   COLONOSCOPY     Leg Laceration Repair  Left    Age 24   PELVIC LYMPH NODE DISSECTION Bilateral 03/22/2018   Procedure: PELVIC LYMPH NODE DISSECTION;  Surgeon: Ceasar Mons, MD;  Location: [facility];  Service: Urology;  Laterality: Bilateral;   PROSTATE BIOPSY     ROBOT ASSISTED LAPAROSCOPIC RADICAL PROSTATECTOMY N/A 03/22/2018   Procedure: XI ROBOTIC ASSISTED LAPAROSCOPIC PROSTATECTOMY;  Surgeon: Ceasar Mons, MD;  Location: [facility];  Service: Urology;  Laterality: N/A;   TOTAL KNEE ARTHROPLASTY Right 05/05/2019   Procedure: RIGHT TOTAL KNEE ARTHROPLASTY;  Surgeon: Frederik Pear, MD;  Location: [facility];  Service: Orthopedics;  Laterality: Right;   Patient Active Problem List   Diagnosis Date Noted   S/P TKR (total knee replacement), right 05/05/2019   Osteoarthritis of right knee 05/02/2019   Prostate cancer ([place]) 03/22/2018   Malignant neoplasm of prostate ([place]) 02/27/2018    ONSET DATE: referral 03/02/22  REFERRING DIAG: G20.A2 (ICD-10-CM) - Parkinson's disease without dyskinesia, with fluctuating manifestations   THERAPY DIAG:  Other symptoms and  signs involving the nervous system  Muscle weakness (generalized)  Other abnormalities of gait and mobility  Unsteadiness on feet  Other lack of coordination  Rationale for Evaluation and Treatment Rehabilitation  SUBJECTIVE:   SUBJECTIVE STATEMENT: Pt reports that he was up a lot during the night and is very tired today. Pt accompanied by: self and spouse dropped pt off  PERTINENT HISTORY: anxiety, arthritis, GERD, HTN, R TKR  PRECAUTIONS: Fall  WEIGHT BEARING RESTRICTIONS No  PAIN:  Are you having pain? No  FALLS: Has patient fallen in last 6 months? Yes. Number of falls 1  PLOF: Requires assistive device for independence and Needs assistance with ADLs  PATIENT GOALS to get as close to normal as possible  OBJECTIVE:   HAND DOMINANCE: Right   FUNCTIONAL OUTCOME MEASURES: Physical performance test: PPT #2 (self-feeding):17.66 sec *     PPT #4 (jacket on/off): Attempted but pt unable to complete due to increased lightheadedness in standing and difficulty with previously educated "cape technique"     3 button/unbutton: 2:56.5  COORDINATION: From screen on 03/02/22 9 Hole Peg test: Right: 49.13 sec; Left: 44.53 sec  MUSCLE TONE: RUE: Mild and LUE: Mild  COGNITION: slower processing overall  VISION: Baseline vision: Bifocals and Wears glasses all the time   TODAY'S TREATMENT:  03/23/22 Bag exercises: completed in front of mirror for visual feedback.  OT encouraging pt to visually attend to reflection in mirror, still providing intermittent cues for increased large amplitude extension with reaching in front; mild improvements when utilizing mirror for feedback.  Limited reaching behind back due to increased pain in shoulders.   Simulated LB dressing with bag: Pt demonstrating significant challenges with LB dressing due to decreased ability to reach forward and fear of falling when reaching forward.  OT broke task down in to parts for exercises to be completed at  home.  Pt completing hip flexion with lifting foot, completing bilaterally.  Then engaged in forward reaching, without lifting foot, to increase trunk flexion as needed for forward reach.  Introduced figure 4 position as option for LB dressing.  Pt demonstrating significant difficulty achieving position R > L but ultimately able to do.  Educated on stretch in this position for hip flexibility.    03/20/22 Exercise classes: PT provided pt with handout for chair level [employer] Moves class.  Pt reports that he was no longer going to the [employer]! Moves class at [facility] as he was having increased difficulty with getting up off the floor.  Discussed recommendation for chair level class.  Pt does report that he does a weekly dance class on Zoom.  Reviewed goals of dance class with large amplitude UE movements. Donning jacket: pt attempted to don jacket, taking 43.6 sec but still unable to get hospital gown around shoulders due to decreased UE movements. Bag exercises: Mod cues with demosntration for large amplitude as UE drooping with repetition/fatigue. Encouraged use of mirror for increased feedback of UE positioning.  Passing bag in front, behind, and overhead to simulate UB dressing and increase ROM as needed for donning jacket and overhead shirt. Discussed local community and online resources and provided with handout.   PATIENT EDUCATION: Education details: Educated on role and purpose of OT as well as potential interventions and goals for therapy based on initial evaluation findings. Person educated: Patient Education method: Explanation Education comprehension: verbalized understanding   HOME EXERCISE PROGRAM: Bag exercises - see pt instructions    GOALS: Goals reviewed with patient? Yes  SHORT TERM GOALS: Target date: 04/07/22  Pt will be Independent with PD specific HEP Baseline: Goal status: IN PROGRESS  2.  Pt will verbalize understanding of adapted strategies and DME/AE PRN (reacher,  rocker knife, scoop plate/plate guard, etc) to maximize safety and independence with ADLs/IADLs. Baseline:  Goal status: IN PROGRESS  3.  Pt will demonstrate improved shoulder flexion bilaterally to retreive a lightweight object from overhead shelf with BUE at >/= 130 shoulder flexion. Baseline:  Goal status: IN PROGRESS  LONG TERM GOALS: Target date: 05/05/22  Pt will demonstrate increased functional ROM and use of BUE to aid in clothing management and hygiene with toileting tasks. Baseline:  Goal status: IN PROGRESS  2.  Pt will demonstrate improved fine motor coordination for ADLs as evidenced by decreasing 9 hole peg test score for RUE by 3 secs  Baseline: 9 Hole Peg test: Right: 49.13 sec; Left: 44.53 sec Goal status: IN PROGRESS  3.  Pt will demonstrate increased ease with dressing as evidenced by decreasing PPT#4(don/ doff jacket) by 30 seconds  Baseline: time TBA at next session Goal status: IN PROGRESS  4.  Pt will demonstrate improved ease with feeding as evidenced by decreasing PPT#2 (self feeding) by 3 secs  Baseline: 17.66 sec Goal status: IN PROGRESS  5. Pt will demonstrate and/or report improved independence with LB dressing with use of AE/DME as  needed.  Baseline:  Goal status: IN PROGRESS   ASSESSMENT:  CLINICAL IMPRESSION: Treatment session with focus on large amplitude movements as needed for UB/LB dressing.  Pt continues to demonstrate significantly impaired UE/LE ROM impacting engagement in UB/LB dressing.  Responsive to bag exercises with focus on large amplitude, however benefiting from breaking simulated LB dressing task into 3 different exercises to focus on flexibility. Discussed community exercise programs with interest in [place] program/access to [place] for PD at the [facility].  PERFORMANCE DEFICITS in functional skills including ADLs, IADLs, coordination, tone, ROM, strength, flexibility, FMC, GMC, balance, body mechanics, endurance, decreased  knowledge of use of DME, and UE functional use and psychosocial skills including environmental adaptation and routines and behaviors.   IMPAIRMENTS are limiting patient from ADLs and IADLs.   COMORBIDITIES may have co-morbidities  that affects occupational performance. Patient will benefit from skilled OT to address above impairments and improve overall function.  MODIFICATION OR ASSISTANCE TO COMPLETE EVALUATION: Min-Moderate modification of tasks or assist with assess necessary to complete an evaluation.  OT OCCUPATIONAL PROFILE AND HISTORY: Detailed assessment: Review of records and additional review of physical, cognitive, psychosocial history related to current functional performance.  CLINICAL DECISION MAKING: Moderate - several treatment options, min-mod task modification necessary  REHAB POTENTIAL: Good  EVALUATION COMPLEXITY: Moderate    PLAN: OT FREQUENCY: 2x/week  OT DURATION: 8 weeks  PLANNED INTERVENTIONS: self care/ADL training, therapeutic exercise, therapeutic activity, neuromuscular re-education, manual therapy, passive range of motion, balance training, functional mobility training, ultrasound, compression bandaging, moist heat, cryotherapy, patient/family education, cognitive remediation/compensation, energy conservation, and DME and/or AE instructions  RECOMMENDED OTHER SERVICES: N/A  CONSULTED AND AGREED WITH PLAN OF CARE: Patient  PLAN FOR NEXT SESSION: Initiate large amplitude HEP, Review and add to bag exercises for dressing tasks - complete in front of mirror for feedback, introduce AE/DME for increased safety/independence with bathing/dressing tasks.   Demontae Antunes, [place], OTR/L 03/23/2022, 11:05 AM

## 2022-03-23 NOTE — Therapy (Signed)
OUTPATIENT PHYSICAL THERAPY NEURO TREATMENT   Patient Name: Matthew [name] MRN: [id number] DOB:03-Jan-1951, 71 y.o., male Today's Date: 03/09/2022   PCP: Lawerance Cruel, MD REFERRING PROVIDER: Carles Collet Eustace Quail, DO    PT End of Session - 03/24/22 1013     Visit Number 5    Number of Visits 12    Date for PT Re-Evaluation 04/20/22    Authorization Type [employer]    Progress Note Due on Visit 10    PT Start Time 0935    PT Stop Time 1013    PT Time Calculation (min) 38 min    Equipment Utilized During Treatment Gait belt    Activity Tolerance Patient tolerated treatment well    Behavior During Therapy WFL for tasks assessed/performed                 Past Medical History:  Diagnosis Date   Anxiety    Arthritis    GERD (gastroesophageal reflux disease)    occ remote history   Hypertension    Parkinson disease    Prostate cancer ([place])    Right knee pain    questionable meniscal tear   Seasonal allergies    Past Surgical History:  Procedure Laterality Date   COLONOSCOPY     Leg Laceration Repair  Left    Age 71   PELVIC LYMPH NODE DISSECTION Bilateral 03/22/2018   Procedure: PELVIC LYMPH NODE DISSECTION;  Surgeon: Ceasar Mons, MD;  Location: [facility];  Service: Urology;  Laterality: Bilateral;   PROSTATE BIOPSY     ROBOT ASSISTED LAPAROSCOPIC RADICAL PROSTATECTOMY N/A 03/22/2018   Procedure: XI ROBOTIC ASSISTED LAPAROSCOPIC PROSTATECTOMY;  Surgeon: Ceasar Mons, MD;  Location: [facility];  Service: Urology;  Laterality: N/A;   TOTAL KNEE ARTHROPLASTY Right 05/05/2019   Procedure: RIGHT TOTAL KNEE ARTHROPLASTY;  Surgeon: Frederik Pear, MD;  Location: [facility];  Service: Orthopedics;  Laterality: Right;   Patient Active Problem List   Diagnosis Date Noted   S/P TKR (total knee replacement), right 05/05/2019   Osteoarthritis of right knee 05/02/2019   Prostate cancer ([place]) 03/22/2018   Malignant neoplasm of prostate ([place]) 02/27/2018     ONSET DATE: 2-3 years  REFERRING DIAG: G20.A2 (ICD-10-CM) - Parkinson's disease without dyskinesia, with fluctuating manifestations   THERAPY DIAG:  Other symptoms and signs involving the nervous system  Muscle weakness (generalized)  Other abnormalities of gait and mobility  Unsteadiness on feet  Rationale for Evaluation and Treatment Rehabilitation  SUBJECTIVE:  SUBJECTIVE STATEMENT: Feeling extremely tired today. Feel like this week has worn me out.   Pt accompanied by: self  PERTINENT HISTORY: Parkinson's, right TKR,   PAIN:  Are you having pain? No  PRECAUTIONS: None  WEIGHT BEARING RESTRICTIONS No  FALLS: Has patient fallen in last 6 months? Yes. Number of falls 2-3 , unable to perform fall recovery  LIVING ENVIRONMENT: Lives with: lives with their spouse Lives in: House/apartment Stairs: Yes: Internal: 16 steps; can reach both and External: 2-3 steps; on left going up Has following equipment at home: Walker - 4 wheeled  PLOF: Independent with household mobility with device  PATIENT GOALS "get close to normal as possible"  -Be able to walk without fear of falling -Be able to throw horseshoes/darts -Be able to maintain walking program with trekking poles   OBJECTIVE:    TODAY'S TREATMENT: 03/24/22 Activity Comments  Vitals at start of session 137/86 mmHg, 98% spO2, 58bpm    Nustep L5 x 6 min Ues/LEs Maintaining 70-80 SPM  Floor transfers 1x required education on sequence of scooting in order to get into a safe starting position; Edu and demo on sequence/steps; performed with heavy verbal and manual cueing and mod A; imbalance and difficulty from rigidity   Tall kneeling with hands on chair: Alt UE raise (to tolerance to avoid shoulder discomfort) Glute sets  10x3" Hip hinges 10x Cueing to look in mirror for tall posture; required education on sequence of getting in and out of tall kneeling safely  gait training with 4WW x61f   Cueing to "march" feet to avoid shuffling           Below measures were taken at time of initial evaluation unless otherwise specified:   DIAGNOSTIC FINDINGS: none recently  COGNITION: Overall cognitive status: Within functional limits for tasks assessed   SENSATION: WFL  COORDINATION: Grossly intact with some deficits in rapid, alternating and divided movement tasks  EDEMA:    MUSCLE TONE: some rigidity noted in BLE   MUSCLE LENGTH: Knee flexion contractures  DTRs:    POSTURE: forward head  LOWER EXTREMITY ROM:     Active  Right Eval Left Eval  Hip flexion    Hip extension    Hip abduction    Hip adduction    Hip internal rotation    Hip external rotation    Knee flexion    Knee extension -10 -10  Ankle dorsiflexion 5 5  Ankle plantarflexion    Ankle inversion    Ankle eversion     (Blank rows = not tested)  LOWER EXTREMITY MMT:    Grossly 4/5 BLE  BED MOBILITY:  NT  TRANSFERS: Assistive device utilized: Quad cane large base and None  Sit to stand: Complete Independence Stand to sit: Complete Independence Chair to chair: Modified independence Floor:  NT , pt report need for total assist at this time  RAMP:  NT  CURB:  Level of Assistance: SBA Assistive device utilized: WEnvironmental consultant- 4 wheeled Curb Comments: increased time for processing and negotiation  STAIRS:  Level of Assistance: Modified [employer]: Alternating Pattern  with Bilateral Rails  Number of Stairs: 5   Height of Stairs: 4-6"  Comments:   GAIT: Gait pattern: shuffling, decreased stride Distance walked:  Assistive device utilized: Walker - 4 wheeled Level of assistance: SBA Comments:   FUNCTIONAL TESTs:  5 times sit to stand: 11 sec Timed up and go (TUG): 15  sec Mini-BEST test: 14/28 Gait Speed:  16.28 sec = 2.01 ft/sec  PATIENT SURVEYS:     PATIENT EDUCATION: Education details: assessment findings Person educated: Patient Education method: Explanation Education comprehension: verbalized understanding and needs further education   HOME EXERCISE PROGRAM: TBD    GOALS: Goals reviewed with patient? Yes  SHORT TERM GOALS: Target date: 03/31/2022  Patient will be able to perform home program independently for self-management. Baseline: Goal status: IN PROGRESS  2.  Improve safety with mobility per time 12 sec TUG to reduce risk for falls Baseline: 15 sec w/ 4WW Goal status: IN PROGRESS   LONG TERM GOALS: Target date: 04/20/2022  Improve safety and reduce risk for falls with mobility per sore 22/28 Mini-BEST test Baseline: 14/28 Goal status: IN PROGRESS  2.  Demonstrate increased gait speed to 3 ft/sec to improve efficiency with community ambulation  Baseline: 2 ft/sec Goal status: IN PROGRESS  3.  Demo supervision level for floor to stand transfers to improve functional mobility and facilitate participation in PD-specific exercise class Baseline: NT, pt reports total assist at this time Goal status: IN PROGRESS  4.  Demonstrate coordination and ability to perform throwing tasks to facilitate participation in leisure activities (horseshoes and darts) Baseline: reports unable to perform Goal status: IN PROGRESS    ASSESSMENT:  CLINICAL IMPRESSION: Patient arrived to session with report of increased fatigue. Vitals at start of session unchanged from patient's baseline- per patient's report. Initiated floor transfer, requiring edu, demo, cues, and mod A. Patient with most difficulty getting into quadruped from sitting and from quadruped to  kneeling position d/t rigidity. Worked on tall kneeling activities for improved core stability and balance. Patient reported fatigue at end of session, no other complaints.     OBJECTIVE IMPAIRMENTS Abnormal gait, decreased activity tolerance, decreased balance, decreased coordination, decreased mobility, difficulty walking, decreased ROM, decreased strength, impaired flexibility, improper body mechanics, and postural dysfunction.   ACTIVITY LIMITATIONS carrying, lifting, bending, squatting, transfers, self feeding, and locomotion level  PARTICIPATION LIMITATIONS: cleaning, interpersonal relationship, shopping, community activity, yard work, and leisure activities  [place] Age, Time since onset of injury/illness/exacerbation, and 1 comorbidity: PD, TKR  are also affecting patient's functional outcome.   REHAB POTENTIAL: Good  CLINICAL DECISION MAKING: Evolving/moderate complexity  EVALUATION COMPLEXITY: Moderate  PLAN: PT FREQUENCY: 2x/week  PT DURATION: 6 weeks  PLANNED INTERVENTIONS: Therapeutic exercises, Therapeutic activity, Neuromuscular re-education, Balance training, Gait training, Patient/Family education, Self Care, Joint mobilization, Stair training, Vestibular training, Canalith repositioning, Orthotic/Fit training, DME instructions, Aquatic Therapy, Dry Needling, Electrical stimulation, Cryotherapy, Moist heat, Taping, and Manual therapy  PLAN FOR NEXT SESSION: work on transfers from quadruped to tall kneeling/ half kneeling; continue gait with RW: floor transfer, throwing coordination assessment(defer and will leave for OT)   Janene Harvey, PT, DPT 03/24/22 10:17 AM  [facility] at [facility] [address] [place], [place] [address] Phone # [phone] Fax # [phone]

## 2022-03-24 ENCOUNTER — Ambulatory Visit: Payer: PPO | Admitting: Neurology

## 2022-03-24 ENCOUNTER — Other Ambulatory Visit (HOSPITAL_COMMUNITY): Payer: Self-pay

## 2022-03-24 ENCOUNTER — Ambulatory Visit: Payer: PPO | Admitting: Physical Therapy

## 2022-03-24 DIAGNOSIS — R2689 Other abnormalities of gait and mobility: Secondary | ICD-10-CM

## 2022-03-24 DIAGNOSIS — M6281 Muscle weakness (generalized): Secondary | ICD-10-CM

## 2022-03-24 DIAGNOSIS — R29818 Other symptoms and signs involving the nervous system: Secondary | ICD-10-CM | POA: Diagnosis not present

## 2022-03-24 DIAGNOSIS — R2681 Unsteadiness on feet: Secondary | ICD-10-CM

## 2022-03-27 ENCOUNTER — Other Ambulatory Visit (HOSPITAL_COMMUNITY): Payer: Self-pay

## 2022-03-27 NOTE — Therapy (Signed)
OUTPATIENT PHYSICAL THERAPY NEURO TREATMENT   Patient Name: Matthew [name] MRN: [id number] DOB:1951-03-14, 71 y.o., male Today's Date: 03/09/2022   PCP: Lawerance Cruel, MD REFERRING PROVIDER: Tat, Eustace Quail, DO          Past Medical History:  Diagnosis Date   Anxiety    Arthritis    GERD (gastroesophageal reflux disease)    occ remote history   Hypertension    Parkinson disease    Prostate cancer ([place])    Right knee pain    questionable meniscal tear   Seasonal allergies    Past Surgical History:  Procedure Laterality Date   COLONOSCOPY     Leg Laceration Repair  Left    Age 9   PELVIC LYMPH NODE DISSECTION Bilateral 03/22/2018   Procedure: PELVIC LYMPH NODE DISSECTION;  Surgeon: Ceasar Mons, MD;  Location: [facility];  Service: Urology;  Laterality: Bilateral;   PROSTATE BIOPSY     ROBOT ASSISTED LAPAROSCOPIC RADICAL PROSTATECTOMY N/A 03/22/2018   Procedure: XI ROBOTIC ASSISTED LAPAROSCOPIC PROSTATECTOMY;  Surgeon: Ceasar Mons, MD;  Location: [facility];  Service: Urology;  Laterality: N/A;   TOTAL KNEE ARTHROPLASTY Right 05/05/2019   Procedure: RIGHT TOTAL KNEE ARTHROPLASTY;  Surgeon: Frederik Pear, MD;  Location: [facility];  Service: Orthopedics;  Laterality: Right;   Patient Active Problem List   Diagnosis Date Noted   S/P TKR (total knee replacement), right 05/05/2019   Osteoarthritis of right knee 05/02/2019   Prostate cancer ([place]) 03/22/2018   Malignant neoplasm of prostate ([place]) 02/27/2018    ONSET DATE: 2-3 years  REFERRING DIAG: G20.A2 (ICD-10-CM) - Parkinson's disease without dyskinesia, with fluctuating manifestations   THERAPY DIAG:  No diagnosis found.  Rationale for Evaluation and Treatment Rehabilitation  SUBJECTIVE:                                                                                                                                                                                              SUBJECTIVE  STATEMENT: Feeling extremely tired today. Feel like this week has worn me out.   Pt accompanied by: self  PERTINENT HISTORY: Parkinson's, right TKR,   PAIN:  Are you having pain? No  PRECAUTIONS: None  WEIGHT BEARING RESTRICTIONS No  FALLS: Has patient fallen in last 6 months? Yes. Number of falls 2-3 , unable to perform fall recovery  LIVING ENVIRONMENT: Lives with: lives with their spouse Lives in: House/apartment Stairs: Yes: Internal: 16 steps; can reach both and External: 2-3 steps; on left going up Has following equipment at home: Walker - 4 wheeled  PLOF: Independent with household mobility with device  PATIENT GOALS "get close to normal as possible"  -Be able to walk without fear of falling -Be able to throw horseshoes/darts -Be able to maintain walking program with trekking poles   OBJECTIVE:     TODAY'S TREATMENT: 03/28/22 Activity Comments                       TODAY'S TREATMENT: 03/24/22 Activity Comments  Vitals at start of session 137/86 mmHg, 98% spO2, 58bpm    Nustep L5 x 6 min Ues/LEs Maintaining 70-80 SPM  Floor transfers 1x required education on sequence of scooting in order to get into a safe starting position; Edu and demo on sequence/steps; performed with heavy verbal and manual cueing and mod A; imbalance and difficulty from rigidity   Tall kneeling with hands on chair: Alt UE raise (to tolerance to avoid shoulder discomfort) Glute sets 10x3" Hip hinges 10x Cueing to look in mirror for tall posture; required education on sequence of getting in and out of tall kneeling safely  gait training with 4WW x64f   Cueing to "march" feet to avoid shuffling           Below measures were taken at time of initial evaluation unless otherwise specified:   DIAGNOSTIC FINDINGS: none recently  COGNITION: Overall cognitive status: Within functional limits for tasks assessed   SENSATION: WFL  COORDINATION: Grossly intact with some deficits  in rapid, alternating and divided movement tasks  EDEMA:    MUSCLE TONE: some rigidity noted in BLE   MUSCLE LENGTH: Knee flexion contractures  DTRs:    POSTURE: forward head  LOWER EXTREMITY ROM:     Active  Right Eval Left Eval  Hip flexion    Hip extension    Hip abduction    Hip adduction    Hip internal rotation    Hip external rotation    Knee flexion    Knee extension -10 -10  Ankle dorsiflexion 5 5  Ankle plantarflexion    Ankle inversion    Ankle eversion     (Blank rows = not tested)  LOWER EXTREMITY MMT:    Grossly 4/5 BLE  BED MOBILITY:  NT  TRANSFERS: Assistive device utilized: Quad cane large base and None  Sit to stand: Complete Independence Stand to sit: Complete Independence Chair to chair: Modified independence Floor:  NT , pt report need for total assist at this time  RAMP:  NT  CURB:  Level of Assistance: SBA Assistive device utilized: WEnvironmental consultant- 4 wheeled Curb Comments: increased time for processing and negotiation  STAIRS:  Level of Assistance: Modified iArtist[employer]ique: Alternating Pattern  with Bilateral Rails  Number of Stairs: 5   Height of Stairs: 4-6"  Comments:   GAIT: Gait pattern: shuffling, decreased stride Distance walked:  Assistive device utilized: WEnvironmental consultant- 4 wheeled Level of assistance: SBA Comments:   FUNCTIONAL TESTs:  5 times sit to stand: 11 sec Timed up and go (TUG): 15 sec Mini-BEST test: 14/28 Gait Speed: 16.28 sec = 2.01 ft/sec  PATIENT SURVEYS:     PATIENT EDUCATION: Education details: assessment findings Person educated: Patient Education method: Explanation Education comprehension: verbalized understanding and needs further education   HOME EXERCISE PROGRAM: TBD    GOALS: Goals reviewed with patient? Yes  SHORT TERM GOALS: Target date: 03/31/2022  Patient will be able to perform home program independently for self-management. Baseline: Goal status:  IN PROGRESS  2.  Improve safety with mobility per time 12  sec TUG to reduce risk for falls Baseline: 15 sec w/ 4WW Goal status: IN PROGRESS   LONG TERM GOALS: Target date: 04/20/2022  Improve safety and reduce risk for falls with mobility per sore 22/28 Mini-BEST test Baseline: 14/28 Goal status: IN PROGRESS  2.  Demonstrate increased gait speed to 3 ft/sec to improve efficiency with community ambulation  Baseline: 2 ft/sec Goal status: IN PROGRESS  3.  Demo supervision level for floor to stand transfers to improve functional mobility and facilitate participation in PD-specific exercise class Baseline: NT, pt reports total assist at this time Goal status: IN PROGRESS  4.  Demonstrate coordination and ability to perform throwing tasks to facilitate participation in leisure activities (horseshoes and darts) Baseline: reports unable to perform Goal status: IN PROGRESS    ASSESSMENT:  CLINICAL IMPRESSION: Patient arrived to session with report of increased fatigue. Vitals at start of session unchanged from patient's baseline- per patient's report. Initiated floor transfer, requiring edu, demo, cues, and mod A. Patient with most difficulty getting into quadruped from sitting and from quadruped to  kneeling position d/t rigidity. Worked on tall kneeling activities for improved core stability and balance. Patient reported fatigue at end of session, no other complaints.    OBJECTIVE IMPAIRMENTS Abnormal gait, decreased activity tolerance, decreased balance, decreased coordination, decreased mobility, difficulty walking, decreased ROM, decreased strength, impaired flexibility, improper body mechanics, and postural dysfunction.   ACTIVITY LIMITATIONS carrying, lifting, bending, squatting, transfers, self feeding, and locomotion level  PARTICIPATION LIMITATIONS: cleaning, interpersonal relationship, shopping, community activity, yard work, and leisure activities  [place] Age,  Time since onset of injury/illness/exacerbation, and 1 comorbidity: PD, TKR  are also affecting patient's functional outcome.   REHAB POTENTIAL: Good  CLINICAL DECISION MAKING: Evolving/moderate complexity  EVALUATION COMPLEXITY: Moderate  PLAN: PT FREQUENCY: 2x/week  PT DURATION: 6 weeks  PLANNED INTERVENTIONS: Therapeutic exercises, Therapeutic activity, Neuromuscular re-education, Balance training, Gait training, Patient/Family education, Self Care, Joint mobilization, Stair training, Vestibular training, Canalith repositioning, Orthotic/Fit training, DME instructions, Aquatic Therapy, Dry Needling, Electrical stimulation, Cryotherapy, Moist heat, Taping, and Manual therapy  PLAN FOR NEXT SESSION: work on transfers from quadruped to tall kneeling/ half kneeling; continue gait with RW: floor transfer, throwing coordination assessment(defer and will leave for OT)   Janene Harvey, PT, DPT 03/27/22 10:03 AM  [name] at [facility], [place], [place] [address] Phone # [phone] Fax # [phone]

## 2022-03-28 ENCOUNTER — Ambulatory Visit: Payer: PPO | Admitting: Physical Therapy

## 2022-03-28 ENCOUNTER — Ambulatory Visit: Payer: PPO | Admitting: Occupational Therapy

## 2022-03-28 DIAGNOSIS — R278 Other lack of coordination: Secondary | ICD-10-CM

## 2022-03-28 DIAGNOSIS — M6281 Muscle weakness (generalized): Secondary | ICD-10-CM

## 2022-03-28 DIAGNOSIS — R29818 Other symptoms and signs involving the nervous system: Secondary | ICD-10-CM

## 2022-03-28 DIAGNOSIS — R2689 Other abnormalities of gait and mobility: Secondary | ICD-10-CM

## 2022-03-28 DIAGNOSIS — R2681 Unsteadiness on feet: Secondary | ICD-10-CM

## 2022-03-28 NOTE — Therapy (Signed)
OUTPATIENT OCCUPATIONAL THERAPY  Treatment Note  Patient Name: Matthew [name] MRN: [id number] DOB:12/20/1950, 71 y.o., male Today's Date: 03/28/2022  PCP: Lawerance Cruel, MD REFERRING PROVIDER: Carles Collet Eustace Quail, DO    OT End of Session - 03/28/22 1321     Visit Number 4    Number of Visits 17    Date for OT Re-Evaluation 05/05/22    Authorization Type [employer]    OT Start Time 1318    OT Stop Time 1400    OT Time Calculation (min) 42 min                Past Medical History:  Diagnosis Date   Anxiety    Arthritis    GERD (gastroesophageal reflux disease)    occ remote history   Hypertension    Parkinson disease    Prostate cancer ([place])    Right knee pain    questionable meniscal tear   Seasonal allergies    Past Surgical History:  Procedure Laterality Date   COLONOSCOPY     Leg Laceration Repair  Left    Age 78   PELVIC LYMPH NODE DISSECTION Bilateral 03/22/2018   Procedure: PELVIC LYMPH NODE DISSECTION;  Surgeon: Ceasar Mons, MD;  [facility]: [facility];  Service: Urology;  Laterality: Bilateral;   PROSTATE BIOPSY     ROBOT ASSISTED LAPAROSCOPIC RADICAL PROSTATECTOMY N/A 03/22/2018   Procedure: XI ROBOTIC ASSISTED LAPAROSCOPIC PROSTATECTOMY;  Surgeon: Ceasar Mons, MD;  Location: [facility];  Service: Urology;  Laterality: N/A;   TOTAL KNEE ARTHROPLASTY Right 05/05/2019   Procedure: RIGHT TOTAL KNEE ARTHROPLASTY;  Surgeon: Frederik Pear, MD;  [facility];  Service: Orthopedics;  Laterality: Right;   Patient Active Problem List   Diagnosis Date Noted   S/P TKR (total knee replacement), right 05/05/2019   Osteoarthritis of right knee 05/02/2019   Prostate cancer ([place]) 03/22/2018   Malignant neoplasm of prostate ([place]) 02/27/2018    ONSET DATE: referral 03/02/22  REFERRING DIAG: G20.A2 (ICD-10-CM) - Parkinson's disease without dyskinesia, with fluctuating manifestations   THERAPY DIAG:  Other symptoms and signs involving  the nervous system  Muscle weakness (generalized)  Unsteadiness on feet  Other lack of coordination  Rationale for Evaluation and Treatment Rehabilitation  SUBJECTIVE:   SUBJECTIVE STATEMENT: Pt reports that he will be back for another appt tomorrow. Pt accompanied by: self and spouse dropped pt off  PERTINENT HISTORY: anxiety, arthritis, GERD, HTN, R TKR  PRECAUTIONS: Fall  WEIGHT BEARING RESTRICTIONS No  PAIN:  Are you having pain? No  FALLS: Has patient fallen in last 6 months? Yes. Number of falls 1  PLOF: Requires assistive device for independence and Needs assistance with ADLs  PATIENT GOALS to get as close to normal as possible  OBJECTIVE:   HAND DOMINANCE: Right   FUNCTIONAL OUTCOME MEASURES: Physical performance test: PPT #2 (self-feeding):17.66 sec *     PPT #4 (jacket on/off): Attempted but pt unable to complete due to increased lightheadedness in standing and difficulty with previously educated "cape technique"     3 button/unbutton: 2:56.5  COORDINATION: From screen on 03/02/22 9 Hole Peg test: Right: 49.13 sec; Left: 44.53 sec  MUSCLE TONE: RUE: Mild and LUE: Mild  COGNITION: slower processing overall  VISION: Baseline vision: Bifocals and Wears glasses all the time   TODAY'S TREATMENT:  03/28/22 Bag exercises: completed in front of mirror for visual feedback.  Engaged in simulated LB dressing with picking up LLE while advancing bag underneath.  Pt  demonstrating increased difficulty R > L and back to front > front to back.   Large amplitude: flipping cards with big open hand to grasp and release cards while incorporating large amplitude supination.  Pt demonstrating progressively slower and smaller movements when flipping cards, benefiting from min verbal and demonstration cues for technique.  OT facilitated increased weight shift with reaching behind self to increase ROM and balance as needed for toileting hygiene.   Simulated LB dressing:  educated on use of reacher for LB dressing and to retreive items from floor.  Engaged in simulated LB dressing with use of reacher.  OT educating on plan and focus on continued functional reach, however use of reacher as needed to aid in LB dressing.  Pt reports that he does have a reacher at home, but that he is somewhat flimsy compared to reacher in clinic.  Will continue to benefit from practice with LB dressing with and without reacher.     03/23/22 Bag exercises: completed in front of mirror for visual feedback.  OT encouraging pt to visually attend to reflection in mirror, still providing intermittent cues for increased large amplitude extension with reaching in front; mild improvements when utilizing mirror for feedback.  Limited reaching behind back due to increased pain in shoulders.   Simulated LB dressing with bag: Pt demonstrating significant challenges with LB dressing due to decreased ability to reach forward and fear of falling when reaching forward.  OT broke task down in to parts for exercises to be completed at home.  Pt completing hip flexion with lifting foot, completing bilaterally.  Then engaged in forward reaching, without lifting foot, to increase trunk flexion as needed for forward reach.  Introduced figure 4 position as option for LB dressing.  Pt demonstrating significant difficulty achieving position R > L but ultimately able to do.  Educated on stretch in this position for hip flexibility.    03/20/22 Exercise classes: PT provided pt with handout for chair level PWR! Moves class.  Pt reports that he was no longer going to the PWR! Moves class at [facility] as he was having increased difficulty with getting up off the floor.  Discussed recommendation for chair level class.  Pt does report that he does a weekly dance class on Zoom.  Reviewed goals of dance class with large amplitude UE movements. Donning jacket: pt attempted to don jacket, taking 43.6 sec but still unable to get  hospital gown around shoulders due to decreased UE movements. Bag exercises: Mod cues with demosntration for large amplitude as UE drooping with repetition/fatigue. Encouraged use of mirror for increased feedback of UE positioning.  Passing bag in front, behind, and overhead to simulate UB dressing and increase ROM as needed for donning jacket and overhead shirt. Discussed local community and online resources and provided with handout.   PATIENT EDUCATION: Education details: Educated on role and purpose of OT as well as potential interventions and goals for therapy based on initial evaluation findings. Person educated: Patient Education method: Explanation Education comprehension: verbalized understanding   HOME EXERCISE PROGRAM: Bag exercises - see pt instructions    GOALS: Goals reviewed with patient? Yes  SHORT TERM GOALS: Target date: 04/07/22  Pt will be Independent with PD specific HEP Baseline: Goal status: IN PROGRESS  2.  Pt will verbalize understanding of adapted strategies and DME/AE PRN (reacher, rocker knife, scoop plate/plate guard, etc) to maximize safety and independence with ADLs/IADLs. Baseline:  Goal status: IN PROGRESS  3.  Pt will demonstrate  improved shoulder flexion bilaterally to retreive a lightweight object from overhead shelf with BUE at >/= 130 shoulder flexion. Baseline:  Goal status: IN PROGRESS  LONG TERM GOALS: Target date: 05/05/22  Pt will demonstrate increased functional ROM and use of BUE to aid in clothing management and hygiene with toileting tasks. Baseline:  Goal status: IN PROGRESS  2.  Pt will demonstrate improved fine motor coordination for ADLs as evidenced by decreasing 9 hole peg test score for RUE by 3 secs  Baseline: 9 Hole Peg test: Right: 49.13 sec; Left: 44.53 sec Goal status: IN PROGRESS  3.  Pt will demonstrate increased ease with dressing as evidenced by decreasing PPT#4(don/ doff jacket) by 30 seconds  Baseline: time  TBA at next session Goal status: IN PROGRESS  4.  Pt will demonstrate improved ease with feeding as evidenced by decreasing PPT#2 (self feeding) by 3 secs  Baseline: 17.66 sec Goal status: IN PROGRESS  5. Pt will demonstrate and/or report improved independence with LB dressing with use of AE/DME as needed.  Baseline:  Goal status: IN PROGRESS   ASSESSMENT:  CLINICAL IMPRESSION: Treatment session with focus on large amplitude movements as needed for UB/LB dressing.  Pt continues to demonstrate significantly impaired UE/LE ROM impacting engagement in UB/LB dressing.  Responsive to bag exercises with focus on large amplitude, with improved ability to complete with reaching and lifting LE at the same time this session.  Pt receptive to education on AE for LB dressing and to retreive items from floor.  PERFORMANCE DEFICITS in functional skills including ADLs, IADLs, coordination, tone, ROM, strength, flexibility, FMC, GMC, balance, body mechanics, endurance, decreased knowledge of use of DME, and UE functional use and psychosocial skills including environmental adaptation and routines and behaviors.   IMPAIRMENTS are limiting patient from ADLs and IADLs.   COMORBIDITIES may have co-morbidities  that affects occupational performance. Patient will benefit from skilled OT to address above impairments and improve overall function.  MODIFICATION OR ASSISTANCE TO COMPLETE EVALUATION: Min-Moderate modification of tasks or assist with assess necessary to complete an evaluation.  OT OCCUPATIONAL PROFILE AND HISTORY: Detailed assessment: Review of records and additional review of physical, cognitive, psychosocial history related to current functional performance.  CLINICAL DECISION MAKING: Moderate - several treatment options, min-mod task modification necessary  REHAB POTENTIAL: Good  EVALUATION COMPLEXITY: Moderate    PLAN: OT FREQUENCY: 2x/week  OT DURATION: 8 weeks  PLANNED  INTERVENTIONS: self care/ADL training, therapeutic exercise, therapeutic activity, neuromuscular re-education, manual therapy, passive range of motion, balance training, functional mobility training, ultrasound, compression bandaging, moist heat, cryotherapy, patient/family education, cognitive remediation/compensation, energy conservation, and DME and/or AE instructions  RECOMMENDED OTHER SERVICES: N/A  CONSULTED AND AGREED WITH PLAN OF CARE: Patient  PLAN FOR NEXT SESSION: Initiate large amplitude HEP, Review and add to bag exercises for dressing tasks - complete in front of mirror for feedback, introduce AE/DME for increased safety/independence with bathing/dressing tasks.   Simonne Come, OTR/L 03/28/2022, 1:22 PM

## 2022-03-29 ENCOUNTER — Encounter: Payer: Self-pay | Admitting: Physical Therapy

## 2022-03-29 ENCOUNTER — Ambulatory Visit: Payer: PPO | Admitting: Physical Therapy

## 2022-03-29 ENCOUNTER — Ambulatory Visit: Payer: PPO | Admitting: Occupational Therapy

## 2022-03-29 DIAGNOSIS — M6281 Muscle weakness (generalized): Secondary | ICD-10-CM

## 2022-03-29 DIAGNOSIS — R29818 Other symptoms and signs involving the nervous system: Secondary | ICD-10-CM

## 2022-03-29 DIAGNOSIS — R2681 Unsteadiness on feet: Secondary | ICD-10-CM

## 2022-03-29 DIAGNOSIS — R2689 Other abnormalities of gait and mobility: Secondary | ICD-10-CM

## 2022-03-29 DIAGNOSIS — R278 Other lack of coordination: Secondary | ICD-10-CM

## 2022-03-29 NOTE — Therapy (Signed)
OUTPATIENT PHYSICAL THERAPY NEURO TREATMENT   Patient Name: Matthew [name] MRN: [id number] DOB:1950-08-21, 71 y.o., male Today's Date: 03/09/2022   PCP: Lawerance Cruel, MD REFERRING PROVIDER: Carles Collet Eustace Quail, DO    PT End of Session - 03/29/22 1234     Visit Number 7    Number of Visits 12    Date for PT Re-Evaluation 04/20/22    Authorization Type [employer]    Progress Note Due on Visit 10    PT Start Time 7939    PT Stop Time 1318    PT Time Calculation (min) 43 min    Equipment Utilized During Treatment Gait belt    Activity Tolerance Patient tolerated treatment well    Behavior During Therapy WFL for tasks assessed/performed                  Past Medical History:  Diagnosis Date   Anxiety    Arthritis    GERD (gastroesophageal reflux disease)    occ remote history   Hypertension    Parkinson disease    Prostate cancer ([place])    Right knee pain    questionable meniscal tear   Seasonal allergies    Past Surgical History:  Procedure Laterality Date   COLONOSCOPY     Leg Laceration Repair  Left    Age 12   PELVIC LYMPH NODE DISSECTION Bilateral 03/22/2018   Procedure: PELVIC LYMPH NODE DISSECTION;  Surgeon: Ceasar Mons, MD;  Location: [facility];  Service: Urology;  Laterality: Bilateral;   PROSTATE BIOPSY     ROBOT ASSISTED LAPAROSCOPIC RADICAL PROSTATECTOMY N/A 03/22/2018   Procedure: XI ROBOTIC ASSISTED LAPAROSCOPIC PROSTATECTOMY;  Surgeon: Ceasar Mons, MD;  Location: [facility];  Service: Urology;  Laterality: N/A;   TOTAL KNEE ARTHROPLASTY Right 05/05/2019   Procedure: RIGHT TOTAL KNEE ARTHROPLASTY;  Surgeon: Frederik Pear, MD;  Location: [facility];  Service: Orthopedics;  Laterality: Right;   Patient Active Problem List   Diagnosis Date Noted   S/P TKR (total knee replacement), right 05/05/2019   Osteoarthritis of right knee 05/02/2019   Prostate cancer ([place]) 03/22/2018   Malignant neoplasm of prostate ([place])  02/27/2018    ONSET DATE: 2-3 years  REFERRING DIAG: G20.A2 (ICD-10-CM) - Parkinson's disease without dyskinesia, with fluctuating manifestations   THERAPY DIAG:  Other symptoms and signs involving the nervous system  Muscle weakness (generalized)  Unsteadiness on feet  Other abnormalities of gait and mobility  Rationale for Evaluation and Treatment Rehabilitation  SUBJECTIVE:  SUBJECTIVE STATEMENT: No falls.  Just feel like I have a lot of issues, some of them due to my medications.  No falls, but some near-misses.  Pt accompanied by: self  PERTINENT HISTORY: Parkinson's, right TKR,   PAIN:  Are you having pain? No  PRECAUTIONS: None  WEIGHT BEARING RESTRICTIONS No  FALLS: Has patient fallen in last 6 months? Yes. Number of falls 2-3 , unable to perform fall recovery  LIVING ENVIRONMENT: Lives with: lives with their spouse Lives in: House/apartment Stairs: Yes: Internal: 16 steps; can reach both and External: 2-3 steps; on left going up Has following equipment at home: Walker - 4 wheeled  PLOF: Independent with household mobility with device  PATIENT GOALS "get close to normal as possible"  -Be able to walk without fear of falling -Be able to throw horseshoes/darts -Be able to maintain walking program with trekking poles   OBJECTIVE:    TODAY'S TREATMENT: 03/29/2022 Activity Comments  SciFit NuStep Stepper, Level 3, 4 extremities Keeps SPM >80  Gait 85 ft x 3 with rollator, supervision Cues for "kick" step for increased step length, posture, foot clearance  5 sit<>stand  9.97 sec hands crossed at chest Improved from 11 sec  TUG :  18.35; 15.78 sec   Therapeutic Exercise Alt step taps to 6" step x 10 reps, BUE support Hip extension x 5 reps Forward/back step and  weightshift x 10 reps each leg, 1 UE support Goblet squat with 15# kettlebell x 10 reps Sumo squat with 10# weight x 10 reps 2# weights       Cues for upright posture through shoulders upon return to standing       PATIENT EDUCATION: Education details: considerations to follow-up with medication questions with Dr. Carles Collet.  Provided handout for info on [facility] [employer] [place] Tuesday and [place] Tai Chi class.  Updates to HEP Person educated: Patient Education method: Explanation, Demonstration, and Handouts Education comprehension: verbalized understanding, returned demonstration, and needs further education   Access Code: [email or web address] URL: [email or web address] Date: 03/29/2022 (did not add first two for home yet) Prepared by: [place] with Kettlebell  - 1 x daily - 7 x weekly - 3 sets - 10 reps - Sumo Squat with Dumbbell  - 1 x daily - 7 x weekly - 3 sets - 10 reps - Step Taps on High Step  - 1 x daily - 5 x weekly - 3 sets - 10 reps - Forward and Backward Step Over with Counter Support  - 1 x daily - 5 x weekly - 3 sets - 10 reps  Below measures were taken at time of initial evaluation unless otherwise specified:   DIAGNOSTIC FINDINGS: none recently  COGNITION: Overall cognitive status: Within functional limits for tasks assessed   SENSATION: WFL  COORDINATION: Grossly intact with some deficits in rapid, alternating and divided movement tasks  EDEMA:    MUSCLE TONE: some rigidity noted in BLE   MUSCLE LENGTH: Knee flexion contractures  DTRs:    POSTURE: forward head  LOWER EXTREMITY ROM:     Active  Right Eval Left Eval  Hip flexion    Hip extension    Hip abduction    Hip adduction    Hip internal rotation    Hip external rotation    Knee flexion    Knee extension -10 -10  Ankle dorsiflexion 5 5  Ankle plantarflexion    Ankle inversion  Ankle eversion     (Blank  rows = not tested)  LOWER EXTREMITY MMT:    Grossly 4/5 BLE  BED MOBILITY:  NT  TRANSFERS: Assistive device utilized: Quad cane large base and None  Sit to stand: Complete Independence Stand to sit: Complete Independence Chair to chair: Modified independence Floor:  NT , pt report need for total assist at this time  RAMP:  NT  CURB:  Level of Assistance: SBA Assistive device utilized: Environmental consultant - 4 wheeled Curb Comments: increased time for processing and negotiation  STAIRS:  Level of Assistance: Modified independence  Stair Negotiation Technique: Alternating Pattern  with Bilateral Rails  Number of Stairs: 5   Height of Stairs: 4-6"  Comments:   GAIT: Gait pattern: shuffling, decreased stride Distance walked:  Assistive device utilized: Environmental consultant - 4 wheeled Level of assistance: SBA Comments:   FUNCTIONAL TESTs:  5 times sit to stand: 11 sec Timed up and go (TUG): 15 sec Mini-BEST test: 14/28 Gait Speed: 16.28 sec = 2.01 ft/sec  PATIENT SURVEYS:     PATIENT EDUCATION: Education details: assessment findings Person educated: Patient Education method: Explanation Education comprehension: verbalized understanding and needs further education   HOME EXERCISE PROGRAM: TBD    GOALS: Goals reviewed with patient? Yes  SHORT TERM GOALS: Target date: 03/31/2022  Patient will be able to perform home program independently for self-management. Baseline: Added to HEP 03/29/2022 Goal status: IN PROGRESS  2.  Improve safety with mobility per time 12 sec TUG to reduce risk for falls Baseline: 15 sec w/ 4WW Goal status:GOAL NOT MET-15.78 sec at best   LONG TERM GOALS: Target date: 04/20/2022  Improve safety and reduce risk for falls with mobility per sore 22/28 Mini-BEST test Baseline: 14/28 Goal status: IN PROGRESS  2.  Demonstrate increased gait speed to 3 ft/sec to improve efficiency with community ambulation  Baseline: 2 ft/sec Goal status: IN  PROGRESS  3.  Demo supervision level for floor to stand transfers to improve functional mobility and facilitate participation in PD-specific exercise class Baseline: NT, pt reports total assist at this time Goal status: IN PROGRESS  4.  Demonstrate coordination and ability to perform throwing tasks to facilitate participation in leisure activities (horseshoes and darts) Baseline: reports unable to perform Goal status: IN PROGRESS    ASSESSMENT:  CLINICAL IMPRESSION: Assessed STGs this visit, with STG 1 ongoing, as HEP updated this visit.  STG 2 not met with TUG score 15.78 sec with rollator, compared to 15 sec at eval.  Pt did improve functional strength measure with 5x sit<>stand 9.97 sec, compared to 11 sec at eval.  Progressed exercises at home to include lower extremity strengthening.  He is progressing towards LTGs and will continue to benefit from skilled PT to address ways to improve functional mobility and decreased fall risk.   OBJECTIVE IMPAIRMENTS Abnormal gait, decreased activity tolerance, decreased balance, decreased coordination, decreased mobility, difficulty walking, decreased ROM, decreased strength, impaired flexibility, improper body mechanics, and postural dysfunction.   ACTIVITY LIMITATIONS carrying, lifting, bending, squatting, transfers, self feeding, and locomotion level  PARTICIPATION LIMITATIONS: cleaning, interpersonal relationship, shopping, community activity, yard work, and leisure activities  [place] Age, Time since onset of injury/illness/exacerbation, and 1 comorbidity: PD, TKR  are also affecting patient's functional outcome.   REHAB POTENTIAL: Good  CLINICAL DECISION MAKING: Evolving/moderate complexity  EVALUATION COMPLEXITY: Moderate  PLAN: PT FREQUENCY: 2x/week  PT DURATION: 6 weeks  PLANNED INTERVENTIONS: Therapeutic exercises, Therapeutic activity, Neuromuscular re-education, Balance training, Gait training,  Patient/Family  education, Self Care, Joint mobilization, Stair training, Vestibular training, Canalith repositioning, Orthotic/Fit training, DME instructions, Aquatic Therapy, Dry Needling, Electrical stimulation, Cryotherapy, Moist heat, Taping, and Manual therapy  PLAN FOR NEXT SESSION: Check updates to HEP.  Work on transfers from quadruped to tall kneeling/ half kneeling; continue gait with RW: floor transfer, throwing coordination assessment(defer and will leave for OT)   Mady Haagensen, PT 03/29/22 5:23 PM Phone: [phone] Fax: [place] [facility] [address], [place], [place] [address] Phone # [phone] Fax # [phone]

## 2022-03-29 NOTE — Therapy (Signed)
OUTPATIENT OCCUPATIONAL THERAPY  Treatment Note  Patient Name: Matthew [name] MRN: [id number] DOB:Nov 09, 1950, 71 y.o., male Today's Date: 03/29/2022  PCP: Lawerance Cruel, MD REFERRING PROVIDER: Carles Collet Eustace Quail, DO    OT End of Session - 03/29/22 1320     Visit Number 5    Number of Visits 17    Date for OT Re-Evaluation 05/05/22    Authorization Type [employer]    OT Start Time 8    OT Stop Time 1400    OT Time Calculation (min) 40 min                 Past Medical History:  Diagnosis Date   Anxiety    Arthritis    GERD (gastroesophageal reflux disease)    occ remote history   Hypertension    Parkinson disease    Prostate cancer ([place])    Right knee pain    questionable meniscal tear   Seasonal allergies    Past Surgical History:  Procedure Laterality Date   COLONOSCOPY     Leg Laceration Repair  Left    Age 59   PELVIC LYMPH NODE DISSECTION Bilateral 03/22/2018   Procedure: PELVIC LYMPH NODE DISSECTION;  Surgeon: Ceasar Mons, MD;  Location: [facility];  Service: Urology;  Laterality: Bilateral;   PROSTATE BIOPSY     ROBOT ASSISTED LAPAROSCOPIC RADICAL PROSTATECTOMY N/A 03/22/2018   Procedure: XI ROBOTIC ASSISTED LAPAROSCOPIC PROSTATECTOMY;  Surgeon: Ceasar Mons, MD;  Location: [facility];  Service: Urology;  Laterality: N/A;   TOTAL KNEE ARTHROPLASTY Right 05/05/2019   Procedure: RIGHT TOTAL KNEE ARTHROPLASTY;  Surgeon: Frederik Pear, MD;  Location: [facility];  Service: Orthopedics;  Laterality: Right;   Patient Active Problem List   Diagnosis Date Noted   S/P TKR (total knee replacement), right 05/05/2019   Osteoarthritis of right knee 05/02/2019   Prostate cancer ([place]) 03/22/2018   Malignant neoplasm of prostate ([place]) 02/27/2018    ONSET DATE: referral 03/02/22  REFERRING DIAG: G20.A2 (ICD-10-CM) - Parkinson's disease without dyskinesia, with fluctuating manifestations   THERAPY DIAG:  Other symptoms and signs  involving the nervous system  Other abnormalities of gait and mobility  Other lack of coordination  Unsteadiness on feet  Muscle weakness (generalized)  Rationale for Evaluation and Treatment Rehabilitation  SUBJECTIVE:   SUBJECTIVE STATEMENT: Pt reports that he puts his undergarments on while sitting on the toilet.   Pt accompanied by: self and spouse dropped pt off  PERTINENT HISTORY: anxiety, arthritis, GERD, HTN, R TKR  PRECAUTIONS: Fall  WEIGHT BEARING RESTRICTIONS No  PAIN:  Are you having pain? No  FALLS: Has patient fallen in last 6 months? Yes. Number of falls 1  PLOF: Requires assistive device for independence and Needs assistance with ADLs  PATIENT GOALS to get as close to normal as possible  OBJECTIVE:   HAND DOMINANCE: Right   FUNCTIONAL OUTCOME MEASURES: Physical performance test: PPT #2 (self-feeding):17.66 sec *     PPT #4 (jacket on/off): Attempted but pt unable to complete due to increased lightheadedness in standing and difficulty with previously educated "cape technique"     3 button/unbutton: 2:56.5  COORDINATION: From screen on 03/02/22 9 Hole Peg test: Right: 49.13 sec; Left: 44.53 sec  MUSCLE TONE: RUE: Mild and LUE: Mild  COGNITION: slower processing overall  VISION: Baseline vision: Bifocals and Wears glasses all the time   TODAY'S TREATMENT:  03/29/22 Simulated LB dressing: Pt continues to demonstrate decreased lift off and clearance of foot  when attempting to don pants.  Reiterated use of AE to aid in increased reach to don pants.  OT providing demonstration for use of reacher with donning pants.   Therapeutic exercise: engaged in large amplitude stepping out to side while seated on mat with focus on increased lift off and clearance of foot during exercise to carry over to LB dressing.  OT providing colored dots on floor for feedback for large amplitude of side step. Figure 4 stretch: Pt demonstrating significant difficulty  achieving position R > L but ultimately able to do.  Educated on stretch in this position for hip flexibility. Pt able to hold position ~30 seconds both at midline and with side stretch. Large amplitude reaching: Seated finger flicks with elbows extended.  OT providing demonstration and verbal cues, utilizing mirror for visual feedback for increased amplitude.  Increased challenge to completing in standing, incorporating forward reaching and reaching to R and L.  Min cues for technique, especially drawing attention to wrist extension.   Large amplitude reaching: incorporated vertical as well as posterior reaching with focus on large amplitude reaching.  Increased challenge with more vertical height and when rotating to place items on mat behind pt.  Reiterated functional carryover of movements.      03/28/22 Bag exercises: completed in front of mirror for visual feedback.  Engaged in simulated LB dressing with picking up LLE while advancing bag underneath.  Pt demonstrating increased difficulty R > L and back to front > front to back.   Large amplitude: flipping cards with big open hand to grasp and release cards while incorporating large amplitude supination.  Pt demonstrating progressively slower and smaller movements when flipping cards, benefiting from min verbal and demonstration cues for technique.  OT facilitated increased weight shift with reaching behind self to increase ROM and balance as needed for toileting hygiene.   Simulated LB dressing: educated on use of reacher for LB dressing and to retreive items from floor.  Engaged in simulated LB dressing with use of reacher.  OT educating on plan and focus on continued functional reach, however use of reacher as needed to aid in LB dressing.  Pt reports that he does have a reacher at home, but that he is somewhat flimsy compared to reacher in clinic.  Will continue to benefit from practice with LB dressing with and without  reacher.     03/23/22 Bag exercises: completed in front of mirror for visual feedback.  OT encouraging pt to visually attend to reflection in mirror, still providing intermittent cues for increased large amplitude extension with reaching in front; mild improvements when utilizing mirror for feedback.  Limited reaching behind back due to increased pain in shoulders.   Simulated LB dressing with bag: Pt demonstrating significant challenges with LB dressing due to decreased ability to reach forward and fear of falling when reaching forward.  OT broke task down in to parts for exercises to be completed at home.  Pt completing hip flexion with lifting foot, completing bilaterally.  Then engaged in forward reaching, without lifting foot, to increase trunk flexion as needed for forward reach.  Introduced figure 4 position as option for LB dressing.  Pt demonstrating significant difficulty achieving position R > L but ultimately able to do.  Educated on stretch in this position for hip flexibility.   PATIENT EDUCATION: Education details: Educated on role and purpose of OT as well as potential interventions and goals for therapy based on initial evaluation findings. Person educated: Patient Education  method: Explanation Education comprehension: verbalized understanding   HOME EXERCISE PROGRAM: Bag exercises - see pt instructions    GOALS: Goals reviewed with patient? Yes  SHORT TERM GOALS: Target date: 04/07/22  Pt will be Independent with PD specific HEP Baseline: Goal status: IN PROGRESS  2.  Pt will verbalize understanding of adapted strategies and DME/AE PRN (reacher, rocker knife, scoop plate/plate guard, etc) to maximize safety and independence with ADLs/IADLs. Baseline:  Goal status: IN PROGRESS  3.  Pt will demonstrate improved shoulder flexion bilaterally to retreive a lightweight object from overhead shelf with BUE at >/= 130 shoulder flexion. Baseline:  Goal status: IN  PROGRESS  LONG TERM GOALS: Target date: 05/05/22  Pt will demonstrate increased functional ROM and use of BUE to aid in clothing management and hygiene with toileting tasks. Baseline:  Goal status: IN PROGRESS  2.  Pt will demonstrate improved fine motor coordination for ADLs as evidenced by decreasing 9 hole peg test score for RUE by 3 secs  Baseline: 9 Hole Peg test: Right: 49.13 sec; Left: 44.53 sec Goal status: IN PROGRESS  3.  Pt will demonstrate increased ease with dressing as evidenced by decreasing PPT#4(don/ doff jacket) by 30 seconds  Baseline: time TBA at next session Goal status: IN PROGRESS  4.  Pt will demonstrate improved ease with feeding as evidenced by decreasing PPT#2 (self feeding) by 3 secs  Baseline: 17.66 sec Goal status: IN PROGRESS  5. Pt will demonstrate and/or report improved independence with LB dressing with use of AE/DME as needed.  Baseline:  Goal status: IN PROGRESS   ASSESSMENT:  CLINICAL IMPRESSION: Treatment session with focus on large amplitude movements as needed for UB/LB dressing.  Pt continues to demonstrate significantly impaired UE/LE ROM impacting engagement in UB/LB dressing.  Responsive large amplitude exercises both in sitting and standing, with focus on dynamic standing balance, reaching, lifting LE, and trunk rotation as needed for LB dressing and toileting tasks.  Pt receptive to education on AE for LB dressing.  PERFORMANCE DEFICITS in functional skills including ADLs, IADLs, coordination, tone, ROM, strength, flexibility, FMC, GMC, balance, body mechanics, endurance, decreased knowledge of use of DME, and UE functional use and psychosocial skills including environmental adaptation and routines and behaviors.   IMPAIRMENTS are limiting patient from ADLs and IADLs.   COMORBIDITIES may have co-morbidities  that affects occupational performance. Patient will benefit from skilled OT to address above impairments and improve overall  function.  MODIFICATION OR ASSISTANCE TO COMPLETE EVALUATION: Min-Moderate modification of tasks or assist with assess necessary to complete an evaluation.  OT OCCUPATIONAL PROFILE AND HISTORY: Detailed assessment: Review of records and additional review of physical, cognitive, psychosocial history related to current functional performance.  CLINICAL DECISION MAKING: Moderate - several treatment options, min-mod task modification necessary  REHAB POTENTIAL: Good  EVALUATION COMPLEXITY: Moderate    PLAN: OT FREQUENCY: 2x/week  OT DURATION: 8 weeks  PLANNED INTERVENTIONS: self care/ADL training, therapeutic exercise, therapeutic activity, neuromuscular re-education, manual therapy, passive range of motion, balance training, functional mobility training, ultrasound, compression bandaging, moist heat, cryotherapy, patient/family education, cognitive remediation/compensation, energy conservation, and DME and/or AE instructions  RECOMMENDED OTHER SERVICES: N/A  CONSULTED AND AGREED WITH PLAN OF CARE: Patient  PLAN FOR NEXT SESSION: Initiate large amplitude HEP, Review and add to bag exercises for dressing tasks - complete in front of mirror for feedback, introduce AE/DME for increased safety/independence with bathing/dressing tasks.   Kristiann Noyce, [place], OTR/L 03/29/2022, 1:21 PM

## 2022-03-31 ENCOUNTER — Ambulatory Visit: Payer: PPO | Admitting: Neurology

## 2022-03-31 DIAGNOSIS — K117 Disturbances of salivary secretion: Secondary | ICD-10-CM

## 2022-03-31 MED ORDER — RIMABOTULINUMTOXINB 5000 UNIT/ML IM SOLN
5000.0000 [IU] | Freq: Once | INTRAMUSCULAR | Status: AC
  Administered 2022-03-31: 5000 [IU] via INTRAMUSCULAR

## 2022-03-31 NOTE — Procedures (Signed)
Botulinum [facility]    History:  Diagnosis: Sialorrhea    Result History  Helps but wears off 1 month prior  Consent obtained from: The patient The patient was educated on the botulinum toxin the black blox warning and given a copy of the botox patient medication guide.  The patient understands that this warning states that there have been reported cases of the Botox extending beyond the injection site and creating adverse effects, similar to those of botulism. This included loss of strength, trouble walking, hoarseness, trouble saying words clearly, loss of bladder control, trouble breathing, trouble swallowing, diplopia, blurry vision and ptosis. Most of the distant spread of Botox was happening in patients, primarily children, who received medication for spasticity or for cervical dystonia. The patient expressed understanding and desire to proceed.     Injections  Location Left  Right Units Number of sites  Submandibular gland 250 250 500 1 per side  Parotid 2250 2250 2500 1 per side  TOTAL UNITS:     5000      Type of Toxin: Myobloc type B As ordered and injected IM at today's visit Total Units: 5000  Discarded Units: 0  Needle drawback with each injection was free of blood. Pt tolerated procedure well without complications.   Reinjection is anticipated in 3 months.

## 2022-04-03 NOTE — Therapy (Signed)
OUTPATIENT PHYSICAL THERAPY NEURO TREATMENT   Patient Name: Matthew [name] MRN: [id number] DOB:[birth year], 71 y.o., male Today's Date: 03/09/2022   PCP: Lawerance Cruel, MD REFERRING PROVIDER: Carles Collet Eustace Quail, DO    PT End of Session - 04/04/22 1357     Visit Number 8    Number of Visits 12    Date for PT Re-Evaluation 04/20/22    Authorization Type [employer]    Progress Note Due on Visit 10    PT Start Time 9381    PT Stop Time 1357    PT Time Calculation (min) 40 min    Equipment Utilized During Treatment Gait belt    Activity Tolerance Patient tolerated treatment well    Behavior During Therapy WFL for tasks assessed/performed                  Past Medical History:  Diagnosis Date   Anxiety    Arthritis    GERD (gastroesophageal reflux disease)    occ remote history   Hypertension    Parkinson disease    Prostate cancer ([place])    Right knee pain    questionable meniscal tear   Seasonal allergies    Past Surgical History:  Procedure Laterality Date   COLONOSCOPY     Leg Laceration Repair  Left    Age 68   PELVIC LYMPH NODE DISSECTION Bilateral 03/22/2018   Procedure: PELVIC LYMPH NODE DISSECTION;  Surgeon: Ceasar Mons, MD;  Location: [facility];  Service: Urology;  Laterality: Bilateral;   PROSTATE BIOPSY     ROBOT ASSISTED LAPAROSCOPIC RADICAL PROSTATECTOMY N/A 03/22/2018   Procedure: XI ROBOTIC ASSISTED LAPAROSCOPIC PROSTATECTOMY;  Surgeon: Ceasar Mons, MD;  Location: [facility];  Service: Urology;  Laterality: N/A;   TOTAL KNEE ARTHROPLASTY Right 05/05/2019   Procedure: RIGHT TOTAL KNEE ARTHROPLASTY;  Surgeon: Frederik Pear, MD;  Location: [facility];  Service: Orthopedics;  Laterality: Right;   Patient Active Problem List   Diagnosis Date Noted   S/P TKR (total knee replacement), right 05/05/2019   Osteoarthritis of right knee 05/02/2019   Prostate cancer ([place]) 03/22/2018   Malignant neoplasm of prostate ([place])  02/27/2018    ONSET DATE: 2-3 years  REFERRING DIAG: G20.A2 (ICD-10-CM) - Parkinson's disease without dyskinesia, with fluctuating manifestations   THERAPY DIAG:  Other abnormalities of gait and mobility  Muscle weakness (generalized)  Unsteadiness on feet  Other symptoms and signs involving the nervous system  Rationale for Evaluation and Treatment Rehabilitation  SUBJECTIVE:  SUBJECTIVE STATEMENT: Had a fall last night when he was getting ready to get into bed and closed his eyes- woke up mid fall. Was able to get up from the floor with some help from his wife. "Thought I broke my toes because my foot cramped up. But I think it was just a cramp. I was asleep when it all started. Felt tight all over when I woke up. I was thinking about all the steps we went over to get up from the floor."  Pt accompanied by: self  PERTINENT HISTORY: Parkinson's, right TKR,   PAIN:  Are you having pain? No  PRECAUTIONS: None  WEIGHT BEARING RESTRICTIONS No  FALLS: Has patient fallen in last 6 months? Yes. Number of falls 2-3 , unable to perform fall recovery  LIVING ENVIRONMENT: Lives with: lives with their spouse Lives in: House/apartment Stairs: Yes: Internal: 16 steps; can reach both and External: 2-3 steps; on left going up Has following equipment at home: Walker - 4 wheeled  PLOF: Independent with household mobility with device  PATIENT GOALS "get close to normal as possible"  -Be able to walk without fear of falling -Be able to throw horseshoes/darts -Be able to maintain walking program with trekking poles   OBJECTIVE:     TODAY'S TREATMENT: 04/04/22 Activity Comments  Nustep L5 x 6 min Ues/LEs Maintaining 75 SPM  step taps on high step 2#  3x10 Weaning UE support; cueing to increase  step height d/t frequently catching toes on step  fwd/back step over 1/2 foam 2x10 1 UE support; cueing to extend knees and stand tall throughout  goblet squat with 15# kettlebell 10x Guiding hips posteriorly   sumo squat with 10# 10x Cues for slight ER  Thoracic extension at wall 10x Cueing to incorporate thoracic extension, not just shoulder flexion   open book at wall 5x each Relying on hip rather than trunk rotation          HOME EXERCISE PROGRAM Last updated: 04/04/22 Access Code: [email or web address] URL: [email or web address] Date: 04/04/2022 Prepared by: Cook with Kettlebell  - 1 x daily - 7 x weekly - 3 sets - 10 reps - Sumo Squat with Dumbbell  - 1 x daily - 7 x weekly - 3 sets - 10 reps - Step Taps on High Step  - 1 x daily - 5 x weekly - 3 sets - 10 reps - Forward and Backward Step Over with Counter Support  - 1 x daily - 5 x weekly - 3 sets - 10 reps   PATIENT EDUCATION: Education details: provided full HEP handout as this was safely performed today Person educated: Patient Education method: Explanation, Demonstration, Tactile cues, Verbal cues, and Handouts Education comprehension: verbalized understanding and returned demonstration   Below measures were taken at time of initial evaluation unless otherwise specified:   DIAGNOSTIC FINDINGS: none recently  COGNITION: Overall cognitive status: Within functional limits for tasks assessed   SENSATION: WFL  COORDINATION: Grossly intact with some deficits in rapid, alternating and divided movement tasks  EDEMA:    MUSCLE TONE: some rigidity noted in BLE   MUSCLE LENGTH: Knee flexion contractures  DTRs:    POSTURE: forward head  LOWER EXTREMITY ROM:     Active  Right Eval Left Eval  Hip flexion    Hip extension    Hip abduction    Hip adduction    Hip internal rotation  Hip external rotation    Knee flexion    Knee  extension -10 -10  Ankle dorsiflexion 5 5  Ankle plantarflexion    Ankle inversion    Ankle eversion     (Blank rows = not tested)  LOWER EXTREMITY MMT:    Grossly 4/5 BLE  BED MOBILITY:  NT  TRANSFERS: Assistive device utilized: Quad cane large base and None  Sit to stand: Complete Independence Stand to sit: Complete Independence Chair to chair: Modified independence Floor:  NT , pt report need for total assist at this time  RAMP:  NT  CURB:  Level of Assistance: SBA Assistive device utilized: Environmental consultant - 4 wheeled Curb Comments: increased time for processing and negotiation  STAIRS:  Level of Assistance: Modified independence  Stair Negotiation Technique: Alternating Pattern  with Bilateral Rails  Number of Stairs: 5   Height of Stairs: 4-6"  Comments:   GAIT: Gait pattern: shuffling, decreased stride Distance walked:  Assistive device utilized: Environmental consultant - 4 wheeled Level of assistance: SBA Comments:   FUNCTIONAL TESTs:  5 times sit to stand: 11 sec Timed up and go (TUG): 15 sec Mini-BEST test: 14/28 Gait Speed: 16.28 sec = 2.01 ft/sec  PATIENT SURVEYS:     PATIENT EDUCATION: Education details: assessment findings Person educated: Patient Education method: Explanation Education comprehension: verbalized understanding and needs further education   HOME EXERCISE PROGRAM: TBD    GOALS: Goals reviewed with patient? Yes  SHORT TERM GOALS: Target date: 03/31/2022  Patient will be able to perform home program independently for self-management. Baseline: Added to HEP 03/29/2022 Goal status: IN PROGRESS  2.  Improve safety with mobility per time 12 sec TUG to reduce risk for falls Baseline: 15 sec w/ 4WW Goal status:GOAL NOT MET-15.78 sec at best   LONG TERM GOALS: Target date: 04/20/2022  Improve safety and reduce risk for falls with mobility per sore 22/28 Mini-BEST test Baseline: 14/28 Goal status: IN PROGRESS  2.  Demonstrate increased gait  speed to 3 ft/sec to improve efficiency with community ambulation  Baseline: 2 ft/sec Goal status: IN PROGRESS  3.  Demo supervision level for floor to stand transfers to improve functional mobility and facilitate participation in PD-specific exercise class Baseline: NT, pt reports total assist at this time Goal status: IN PROGRESS  4.  Demonstrate coordination and ability to perform throwing tasks to facilitate participation in leisure activities (horseshoes and darts) Baseline: reports unable to perform Goal status: IN PROGRESS    ASSESSMENT:  CLINICAL IMPRESSION: Patient arrived to session with report of sustaining a fall when falling asleep sitting on the side of his bed last night. Reports that he was able to transfer from the floor with some assist from his wife and reports no remaining pain/injury. Reviewed patient's HEP update from last session and provided cues for form. Patient required 1 UE support with balance activities and cueing for larger steps, and cues for improved form with squats. Ultimately, able to perform these with improved form and good tolerance after cueing, thus provided handout with these exercises. Patient reported understanding of all edu provided and without complaints at end of session.     OBJECTIVE IMPAIRMENTS Abnormal gait, decreased activity tolerance, decreased balance, decreased coordination, decreased mobility, difficulty walking, decreased ROM, decreased strength, impaired flexibility, improper body mechanics, and postural dysfunction.   ACTIVITY LIMITATIONS carrying, lifting, bending, squatting, transfers, self feeding, and locomotion level  PARTICIPATION LIMITATIONS: cleaning, interpersonal relationship, shopping, community activity, yard work, and leisure activities  [place]  FACTORS Age, Time since onset of injury/illness/exacerbation, and 1 comorbidity: PD, TKR  are also affecting patient's functional outcome.   REHAB POTENTIAL:  Good  CLINICAL DECISION MAKING: Evolving/moderate complexity  EVALUATION COMPLEXITY: Moderate  PLAN: PT FREQUENCY: 2x/week  PT DURATION: 6 weeks  PLANNED INTERVENTIONS: Therapeutic exercises, Therapeutic activity, Neuromuscular re-education, Balance training, Gait training, Patient/Family education, Self Care, Joint mobilization, Stair training, Vestibular training, Canalith repositioning, Orthotic/Fit training, DME instructions, Aquatic Therapy, Dry Needling, Electrical stimulation, Cryotherapy, Moist heat, Taping, and Manual therapy  PLAN FOR NEXT SESSION: Work on transfers from quadruped to tall kneeling/ half kneeling; continue gait with RW: floor transfer, throwing coordination assessment(defer and will leave for OT)    Janene Harvey, PT, DPT 04/04/22 1:59 PM  [facility] [address], [place], [place] [address] Phone # [phone] Fax # [phone]

## 2022-04-04 ENCOUNTER — Telehealth: Payer: Self-pay | Admitting: Neurology

## 2022-04-04 ENCOUNTER — Ambulatory Visit: Payer: PPO | Admitting: Occupational Therapy

## 2022-04-04 ENCOUNTER — Ambulatory Visit: Payer: PPO | Admitting: Physical Therapy

## 2022-04-04 ENCOUNTER — Encounter: Payer: Self-pay | Admitting: Physical Therapy

## 2022-04-04 DIAGNOSIS — R2681 Unsteadiness on feet: Secondary | ICD-10-CM

## 2022-04-04 DIAGNOSIS — R2689 Other abnormalities of gait and mobility: Secondary | ICD-10-CM

## 2022-04-04 DIAGNOSIS — M6281 Muscle weakness (generalized): Secondary | ICD-10-CM

## 2022-04-04 DIAGNOSIS — R29818 Other symptoms and signs involving the nervous system: Secondary | ICD-10-CM

## 2022-04-04 DIAGNOSIS — R278 Other lack of coordination: Secondary | ICD-10-CM

## 2022-04-04 NOTE — Telephone Encounter (Signed)
Pt's wife called in. The pt has been having new symptoms. His Physical Therapist seems to think he is over medicated. She is worried about his entacapone. He falls asleep. He fell asleep last night beside the bed and fell. His upper lip is constantly protruding. He gets hyper and cannot calm down.

## 2022-04-04 NOTE — Therapy (Signed)
OUTPATIENT OCCUPATIONAL THERAPY  Treatment Note  Patient Name: Matthew [name] MRN: [id number] DOB:[birth year], 71 y.o., male Today's Date: 04/04/2022  PCP: Lawerance Cruel, MD REFERRING PROVIDER: Carles Collet Eustace Quail, DO    OT End of Session - 04/04/22 1252     Visit Number 6    Number of Visits 17    Date for OT Re-Evaluation 05/05/22    Authorization Type [employer]    OT Start Time 1235    OT Stop Time 1315    OT Time Calculation (min) 40 min                  Past Medical History:  Diagnosis Date   Anxiety    Arthritis    GERD (gastroesophageal reflux disease)    occ remote history   Hypertension    Parkinson disease    Prostate cancer ([place])    Right knee pain    questionable meniscal tear   Seasonal allergies    Past Surgical History:  Procedure Laterality Date   COLONOSCOPY     Leg Laceration Repair  Left    Age 54   PELVIC LYMPH NODE DISSECTION Bilateral 03/22/2018   Procedure: PELVIC LYMPH NODE DISSECTION;  Surgeon: Ceasar Mons, MD;  Location: [facility];  Service: Urology;  Laterality: Bilateral;   PROSTATE BIOPSY     ROBOT ASSISTED LAPAROSCOPIC RADICAL PROSTATECTOMY N/A 03/22/2018   Procedure: XI ROBOTIC ASSISTED LAPAROSCOPIC PROSTATECTOMY;  Surgeon: Ceasar Mons, MD;  Location: [facility];  Service: Urology;  Laterality: N/A;   TOTAL KNEE ARTHROPLASTY Right 05/05/2019   Procedure: RIGHT TOTAL KNEE ARTHROPLASTY;  Surgeon: Frederik Pear, MD;  Location: [facility];  Service: Orthopedics;  Laterality: Right;   Patient Active Problem List   Diagnosis Date Noted   S/P TKR (total knee replacement), right 05/05/2019   Osteoarthritis of right knee 05/02/2019   Prostate cancer ([place]) 03/22/2018   Malignant neoplasm of prostate ([place]) 02/27/2018    ONSET DATE: referral 03/02/22  REFERRING DIAG: G20.A2 (ICD-10-CM) - Parkinson's disease without dyskinesia, with fluctuating manifestations   THERAPY DIAG:  Other symptoms and signs  involving the nervous system  Other lack of coordination  Other abnormalities of gait and mobility  Muscle weakness (generalized)  Rationale for Evaluation and Treatment Rehabilitation  SUBJECTIVE:   SUBJECTIVE STATEMENT: Pt reports that he had a fall last night.  He reports that he was about to get in bed and closed his eyes "for a second" and next thing he knew he had fallen backwards and was on the ground.  Pt reports that he finds that he is tired a lot and even takes naps most days. Pt accompanied by: self and spouse dropped pt off  PERTINENT HISTORY: anxiety, arthritis, GERD, HTN, R TKR  PRECAUTIONS: Fall  WEIGHT BEARING RESTRICTIONS No  PAIN:  Are you having pain? No  FALLS: Has patient fallen in last 6 months? Yes. Number of falls 2, fell last night  PLOF: Requires assistive device for independence and Needs assistance with ADLs  PATIENT GOALS to get as close to normal as possible  OBJECTIVE:   HAND DOMINANCE: Right   FUNCTIONAL OUTCOME MEASURES: Physical performance test: PPT #2 (self-feeding):17.66 sec *     PPT #4 (jacket on/off): Attempted but pt unable to complete due to increased lightheadedness in standing and difficulty with previously educated "cape technique"     3 button/unbutton: 2:56.5  COORDINATION: From screen on 03/02/22 9 Hole Peg test: Right: 49.13 sec; Left: 44.53 sec  MUSCLE TONE: RUE: Mild and LUE: Mild  COGNITION: slower processing overall  VISION: Baseline vision: Bifocals and Wears glasses all the time   TODAY'S TREATMENT:  04/04/22 ADL: discussed fall experienced last night.  Pt with multiple questions about medications and increased experiences of sleepiness and near falling asleep during standing.  OT encouraged pt to speak with MD regarding pt concerns, especially as pt had fall yesterday. Sleep hygiene: reporting getting up 2-3 times per night to urinate.  Then taking ~30 mins to fall back asleep after toileting.  Sometimes  falling asleep while seated on the toilet.   Large amplitude: engaged in reaching behind head with ball clockwise and then counter clockwise.  OT providing demonstration and verbal cues for technique to facilitate increased ROM when reaching behind head.  Transitioned to reaching around torso with ball. OT providing demonstration and cues for increased amplitude reaching prior to reaching behind back to facilitate increased ROM. Pt doffed vest to allow for increased ROM. ADL: Pt with significant challenges with removing vest.  OT providing demonstration and verbal cues for increased hand placement to allow for large amplitude movements for increased ease with doffing vest.  Pt getting stuck with vest still on shoulder, benefiting from increased time/effort and min assist from therapist. Large amplitude reaching: incorporated horizontal and posterior reaching with focus on large amplitude reaching.  Increased challenge with cues for increased weight shift and large amplitude during reaching.  OT educating on increased hand placement on cones to facilitate increased motor control.  Reiterated functional carryover of movements to carry over to dressing tasks/toileting hygiene.    03/29/22 Simulated LB dressing: Pt continues to demonstrate decreased lift off and clearance of foot when attempting to don pants.  Reiterated use of AE to aid in increased reach to don pants.  OT providing demonstration for use of reacher with donning pants.   Therapeutic exercise: engaged in large amplitude stepping out to side while seated on mat with focus on increased lift off and clearance of foot during exercise to carry over to LB dressing.  OT providing colored dots on floor for feedback for large amplitude of side step. Figure 4 stretch: Pt demonstrating significant difficulty achieving position R > L but ultimately able to do.  Educated on stretch in this position for hip flexibility. Pt able to hold position ~30 seconds  both at midline and with side stretch. Large amplitude reaching: Seated finger flicks with elbows extended.  OT providing demonstration and verbal cues, utilizing mirror for visual feedback for increased amplitude.  Increased challenge to completing in standing, incorporating forward reaching and reaching to R and L.  Min cues for technique, especially drawing attention to wrist extension.   Large amplitude reaching: incorporated vertical as well as posterior reaching with focus on large amplitude reaching.  Increased challenge with more vertical height and when rotating to place items on mat behind pt.  Reiterated functional carryover of movements.      03/28/22 Bag exercises: completed in front of mirror for visual feedback.  Engaged in simulated LB dressing with picking up LLE while advancing bag underneath.  Pt demonstrating increased difficulty R > L and back to front > front to back.   Large amplitude: flipping cards with big open hand to grasp and release cards while incorporating large amplitude supination.  Pt demonstrating progressively slower and smaller movements when flipping cards, benefiting from min verbal and demonstration cues for technique.  OT facilitated increased weight shift with reaching behind self to  increase ROM and balance as needed for toileting hygiene.   Simulated LB dressing: educated on use of reacher for LB dressing and to retreive items from floor.  Engaged in simulated LB dressing with use of reacher.  OT educating on plan and focus on continued functional reach, however use of reacher as needed to aid in LB dressing.  Pt reports that he does have a reacher at home, but that he is somewhat flimsy compared to reacher in clinic.  Will continue to benefit from practice with LB dressing with and without reacher.    PATIENT EDUCATION: Education details: Educated on role and purpose of OT as well as potential interventions and goals for therapy based on initial evaluation  findings. Person educated: Patient Education method: Explanation Education comprehension: verbalized understanding   HOME EXERCISE PROGRAM: Bag exercises - see pt instructions    GOALS: Goals reviewed with patient? Yes  SHORT TERM GOALS: Target date: 04/07/22  Pt will be Independent with PD specific HEP Baseline: Goal status: IN PROGRESS  2.  Pt will verbalize understanding of adapted strategies and DME/AE PRN (reacher, rocker knife, scoop plate/plate guard, etc) to maximize safety and independence with ADLs/IADLs. Baseline:  Goal status: IN PROGRESS  3.  Pt will demonstrate improved shoulder flexion bilaterally to retreive a lightweight object from overhead shelf with BUE at >/= 130 shoulder flexion. Baseline:  Goal status: IN PROGRESS  LONG TERM GOALS: Target date: 05/05/22  Pt will demonstrate increased functional ROM and use of BUE to aid in clothing management and hygiene with toileting tasks. Baseline:  Goal status: IN PROGRESS  2.  Pt will demonstrate improved fine motor coordination for ADLs as evidenced by decreasing 9 hole peg test score for RUE by 3 secs  Baseline: 9 Hole Peg test: Right: 49.13 sec; Left: 44.53 sec Goal status: IN PROGRESS  3.  Pt will demonstrate increased ease with dressing as evidenced by decreasing PPT#4(don/ doff jacket) by 30 seconds  Baseline: time TBA at next session Goal status: IN PROGRESS  4.  Pt will demonstrate improved ease with feeding as evidenced by decreasing PPT#2 (self feeding) by 3 secs  Baseline: 17.66 sec Goal status: IN PROGRESS  5. Pt will demonstrate and/or report improved independence with LB dressing with use of AE/DME as needed.  Baseline:  Goal status: IN PROGRESS   ASSESSMENT:  CLINICAL IMPRESSION: Treatment session with focus on large amplitude movements as needed for UB/LB dressing and toileting needs.  Pt continues to demonstrate significant slowing in mobility and quality of movement as evident with  aspects of dressing tasks and even functional mobility.  Pt reports increased sleepiness and even falling asleep while standing which resulted in fall last night.    PERFORMANCE DEFICITS in functional skills including ADLs, IADLs, coordination, tone, ROM, strength, flexibility, FMC, GMC, balance, body mechanics, endurance, decreased knowledge of use of DME, and UE functional use and psychosocial skills including environmental adaptation and routines and behaviors.   IMPAIRMENTS are limiting patient from ADLs and IADLs.   COMORBIDITIES may have co-morbidities  that affects occupational performance. Patient will benefit from skilled OT to address above impairments and improve overall function.  MODIFICATION OR ASSISTANCE TO COMPLETE EVALUATION: Min-Moderate modification of tasks or assist with assess necessary to complete an evaluation.  OT OCCUPATIONAL PROFILE AND HISTORY: Detailed assessment: Review of records and additional review of physical, cognitive, psychosocial history related to current functional performance.  CLINICAL DECISION MAKING: Moderate - several treatment options, min-mod task modification necessary  REHAB  POTENTIAL: Good  EVALUATION COMPLEXITY: Moderate    PLAN: OT FREQUENCY: 2x/week  OT DURATION: 8 weeks  PLANNED INTERVENTIONS: self care/ADL training, therapeutic exercise, therapeutic activity, neuromuscular re-education, manual therapy, passive range of motion, balance training, functional mobility training, ultrasound, compression bandaging, moist heat, cryotherapy, patient/family education, cognitive remediation/compensation, energy conservation, and DME and/or AE instructions  RECOMMENDED OTHER SERVICES: N/A  CONSULTED AND AGREED WITH PLAN OF CARE: Patient  PLAN FOR NEXT SESSION: Initiate/review large amplitude HEP, Review and add to bag exercises for dressing tasks - complete in front of mirror for feedback, introduce AE/DME for increased safety/independence  with bathing/dressing tasks.  Continue to check in on quality of sleep.   Simonne Come, OTR/L 04/04/2022, 12:52 PM

## 2022-04-04 NOTE — Telephone Encounter (Signed)
Please let them know that I spoke with the PT and there must be a misunderstanding.  She never said that he was overmedicated per my conversation with her.  That would be outside of her scope of expertise.  We can get him on cx list if they would like.  They will need to make sure that he arrives timely if we are able to get him in.

## 2022-04-05 NOTE — Telephone Encounter (Signed)
Called and spoke to patients wife and informed her of Dr. Doristine Devoid response and recommendations. Patients wife stated that "She" was the one who thinks he is over medicated. I informed patients wife that any concerns or questions will have to be addressed in an appointment which is why we have offered to place patient on a cancellation list. Patients wife verbalized understanding and will await a cancellation spot.

## 2022-04-06 ENCOUNTER — Encounter: Payer: Self-pay | Admitting: Physical Therapy

## 2022-04-06 ENCOUNTER — Ambulatory Visit: Payer: PPO | Admitting: Physical Therapy

## 2022-04-06 ENCOUNTER — Ambulatory Visit: Payer: PPO | Admitting: Occupational Therapy

## 2022-04-06 DIAGNOSIS — R2681 Unsteadiness on feet: Secondary | ICD-10-CM

## 2022-04-06 DIAGNOSIS — R2689 Other abnormalities of gait and mobility: Secondary | ICD-10-CM

## 2022-04-06 DIAGNOSIS — R278 Other lack of coordination: Secondary | ICD-10-CM

## 2022-04-06 DIAGNOSIS — M6281 Muscle weakness (generalized): Secondary | ICD-10-CM

## 2022-04-06 DIAGNOSIS — R29818 Other symptoms and signs involving the nervous system: Secondary | ICD-10-CM

## 2022-04-06 NOTE — Therapy (Signed)
OUTPATIENT PHYSICAL THERAPY NEURO TREATMENT   Patient Name: Matthew [name] MRN: [id number] DOB:1950/07/29, 71 y.o., male Today's Date: 03/09/2022   PCP: Lawerance Cruel, MD REFERRING PROVIDER: Carles Collet Eustace Quail, DO    PT End of Session - 04/06/22 1151     Visit Number 9    Number of Visits 12    Date for PT Re-Evaluation 04/20/22    Authorization Type [employer]    Progress Note Due on Visit 10    PT Start Time 1150    PT Stop Time 1232    PT Time Calculation (min) 42 min    Equipment Utilized During Treatment Gait belt    Activity Tolerance Patient tolerated treatment well    Behavior During Therapy WFL for tasks assessed/performed                  Past Medical History:  Diagnosis Date   Anxiety    Arthritis    GERD (gastroesophageal reflux disease)    occ remote history   Hypertension    Parkinson disease    Prostate cancer ([place])    Right knee pain    questionable meniscal tear   Seasonal allergies    Past Surgical History:  Procedure Laterality Date   COLONOSCOPY     Leg Laceration Repair  Left    Age 70   PELVIC LYMPH NODE DISSECTION Bilateral 03/22/2018   Procedure: PELVIC LYMPH NODE DISSECTION;  Surgeon: Ceasar Mons, MD;  Location: [facility];  Service: Urology;  Laterality: Bilateral;   PROSTATE BIOPSY     ROBOT ASSISTED LAPAROSCOPIC RADICAL PROSTATECTOMY N/A 03/22/2018   Procedure: XI ROBOTIC ASSISTED LAPAROSCOPIC PROSTATECTOMY;  Surgeon: Ceasar Mons, MD;  Location: [facility];  Service: Urology;  Laterality: N/A;   TOTAL KNEE ARTHROPLASTY Right 05/05/2019   Procedure: RIGHT TOTAL KNEE ARTHROPLASTY;  Surgeon: Frederik Pear, MD;  Location: [facility];  Service: Orthopedics;  Laterality: Right;   Patient Active Problem List   Diagnosis Date Noted   S/P TKR (total knee replacement), right 05/05/2019   Osteoarthritis of right knee 05/02/2019   Prostate cancer ([place]) 03/22/2018   Malignant neoplasm of prostate ([place])  02/27/2018    ONSET DATE: 2-3 years  REFERRING DIAG: G20.A2 (ICD-10-CM) - Parkinson's disease without dyskinesia, with fluctuating manifestations   THERAPY DIAG:  Other abnormalities of gait and mobility  Muscle weakness (generalized)  Unsteadiness on feet  Other symptoms and signs involving the nervous system  Rationale for Evaluation and Treatment Rehabilitation  SUBJECTIVE:  SUBJECTIVE STATEMENT: No other falls since the other night.  Just so tired all the time and feel jittery all the time.    Pt accompanied by: self  PERTINENT HISTORY: Parkinson's, right TKR,   PAIN:  Are you having pain? No  PRECAUTIONS: None  WEIGHT BEARING RESTRICTIONS No  FALLS: Has patient fallen in last 6 months? Yes. Number of falls 2-3 , unable to perform fall recovery  LIVING ENVIRONMENT: Lives with: lives with their spouse Lives in: House/apartment Stairs: Yes: Internal: 16 steps; can reach both and External: 2-3 steps; on left going up Has following equipment at home: Walker - 4 wheeled  PLOF: Independent with household mobility with device  PATIENT GOALS "get close to normal as possible"  -Be able to walk without fear of falling -Be able to throw horseshoes/darts -Be able to maintain walking program with trekking poles   OBJECTIVE:    TODAY'S TREATMENT: 04/06/2022 Activity Comments  Vitals pre-session:  163/93 HR 56 bpm   Seated PWR! Moves: -PWR! Up -PWR! [place]! Twist -PWR! Step (2# weight) 10 reps throughout For warm-up for improved effort of movement patterns.  Forward/back stepping over obstacle, 2 x 10 reps UE support, cues for knee extension, upright posture, cues for increased step length, foot clearance  Side stepping over obstacle, 2 x 10 reps UE support-cues for wider  step, foot clearance  Forward step ups, x 10 reps Cues to hear "STOMP" for bigger effort  Stand>tall kneel position (knees on Airex), then >1/2 kneel to supported stance 5 reps each side; with tall kneel>1/2 kneel-cues for increased effort for foot placement>1/2 kneel to stand with min assist Ues supported at mat, with min assist  Short distance gait between activities, using rollator Cues to "take as few steps as possible" for longer strides; upon leaving, pt self-selects longer stride length without cues       HOME EXERCISE PROGRAM Last updated: 04/04/22 Access Code: [email or web address] URL: [email or web address] Date: 04/04/2022 Prepared by: [place] with Kettlebell  - 1 x daily - 7 x weekly - 3 sets - 10 reps - Sumo Squat with Dumbbell  - 1 x daily - 7 x weekly - 3 sets - 10 reps - Step Taps on High Step  - 1 x daily - 5 x weekly - 3 sets - 10 reps - Forward and Backward Step Over with Counter Support  - 1 x daily - 5 x weekly - 3 sets - 10 reps   PATIENT EDUCATION: Education details: Rationale for LARGE/BIG/EFFORTFUL movement patterns, with increased effort to start movements (versus repeated small, small, small efforts that deplete energy) Person educated: Patient Education method: Explanation, Demonstration, Tactile cues, and Verbal cues Education comprehension: verbalized understanding, returned demonstration, and needs further education   Below measures were taken at time of initial evaluation unless otherwise specified:   DIAGNOSTIC FINDINGS: none recently  COGNITION: Overall cognitive status: Within functional limits for tasks assessed   SENSATION: WFL  COORDINATION: Grossly intact with some deficits in rapid, alternating and divided movement tasks  EDEMA:    MUSCLE TONE: some rigidity noted in BLE   MUSCLE LENGTH: Knee flexion contractures  DTRs:    POSTURE: forward head  LOWER  EXTREMITY ROM:     Active  Right Eval Left Eval  Hip flexion    Hip extension    Hip abduction    Hip adduction    Hip internal rotation  Hip external rotation    Knee flexion    Knee extension -10 -10  Ankle dorsiflexion 5 5  Ankle plantarflexion    Ankle inversion    Ankle eversion     (Blank rows = not tested)  LOWER EXTREMITY MMT:    Grossly 4/5 BLE  BED MOBILITY:  NT  TRANSFERS: Assistive device utilized: Quad cane large base and None  Sit to stand: Complete Independence Stand to sit: Complete Independence Chair to chair: Modified independence Floor:  NT , pt report need for total assist at this time  RAMP:  NT  CURB:  Level of Assistance: SBA Assistive device utilized: Environmental consultant - 4 wheeled Curb Comments: increased time for processing and negotiation  STAIRS:  Level of Assistance: Modified independence  Stair Negotiation Technique: Alternating Pattern  with Bilateral Rails  Number of Stairs: 5   Height of Stairs: 4-6"  Comments:   GAIT: Gait pattern: shuffling, decreased stride Distance walked:  Assistive device utilized: Environmental consultant - 4 wheeled Level of assistance: SBA Comments:   FUNCTIONAL TESTs:  5 times sit to stand: 11 sec Timed up and go (TUG): 15 sec Mini-BEST test: 14/28 Gait Speed: 16.28 sec = 2.01 ft/sec  PATIENT SURVEYS:     PATIENT EDUCATION: Education details: assessment findings Person educated: Patient Education method: Explanation Education comprehension: verbalized understanding and needs further education   HOME EXERCISE PROGRAM: TBD    GOALS: Goals reviewed with patient? Yes  SHORT TERM GOALS: Target date: 03/31/2022  Patient will be able to perform home program independently for self-management. Baseline: Added to HEP 03/29/2022 Goal status: IN PROGRESS  2.  Improve safety with mobility per time 12 sec TUG to reduce risk for falls Baseline: 15 sec w/ 4WW Goal status:GOAL NOT MET-15.78 sec at best   LONG  TERM GOALS: Target date: 04/20/2022  Improve safety and reduce risk for falls with mobility per sore 22/28 Mini-BEST test Baseline: 14/28 Goal status: IN PROGRESS  2.  Demonstrate increased gait speed to 3 ft/sec to improve efficiency with community ambulation  Baseline: 2 ft/sec Goal status: IN PROGRESS  3.  Demo supervision level for floor to stand transfers to improve functional mobility and facilitate participation in PD-specific exercise class Baseline: NT, pt reports total assist at this time Goal status: IN PROGRESS  4.  Demonstrate coordination and ability to perform throwing tasks to facilitate participation in leisure activities (horseshoes and darts) Baseline: reports unable to perform Goal status: IN PROGRESS    ASSESSMENT:  CLINICAL IMPRESSION: Skilled PT session today focused on LARGE amplitude movement patterns in varied positions:  seated, standing, tall kneel>1/2 kneel>stand.  Reinforced large effort to initiate movements, which is overall better that repeated small movements that deplete energy.  Pt is able to demonstrate this with floor>stand practice, step ups, and with gait at end of session, with pt able to take longer strides without cueing.  He will continue to benefit from reinforcement of these strategies for improved balance and gait.  OBJECTIVE IMPAIRMENTS Abnormal gait, decreased activity tolerance, decreased balance, decreased coordination, decreased mobility, difficulty walking, decreased ROM, decreased strength, impaired flexibility, improper body mechanics, and postural dysfunction.   ACTIVITY LIMITATIONS carrying, lifting, bending, squatting, transfers, self feeding, and locomotion level  PARTICIPATION LIMITATIONS: cleaning, interpersonal relationship, shopping, community activity, yard work, and leisure activities  [place] Age, Time since onset of injury/illness/exacerbation, and 1 comorbidity: PD, TKR  are also affecting patient's functional  outcome.   REHAB POTENTIAL: Good  CLINICAL DECISION MAKING: Evolving/moderate complexity  EVALUATION COMPLEXITY: Moderate  PLAN: PT FREQUENCY: 2x/week  PT DURATION: 6 weeks  PLANNED INTERVENTIONS: Therapeutic exercises, Therapeutic activity, Neuromuscular re-education, Balance training, Gait training, Patient/Family education, Self Care, Joint mobilization, Stair training, Vestibular training, Canal[place] repositioning, Orthotic/Fit training, DME instructions, Aquatic Therapy, Dry Needling, Electrical stimulation, Cryotherapy, Moist heat, Taping, and Manual therapy  PLAN FOR NEXT SESSION: Continue to work on transfers from quadruped to tall kneeling/ half kneeling; continue gait with RW: floor transfer, LARGE amplitude movement patterns, BIG effort to initiate movements  Mady Haagensen, PT 04/06/22 12:39 PM Phone: [phone] Fax: [place] at [facility] [address] [place], [place] [address] Phone # [phone] Fax # [phone]

## 2022-04-06 NOTE — Therapy (Signed)
OUTPATIENT OCCUPATIONAL THERAPY  Treatment Note  Patient Name: Matthew [name] MRN: [id number] DOB:02/08/1951, 71 y.o., male Today's Date: 04/06/2022  PCP: Lawerance Cruel, MD REFERRING PROVIDER: Carles Collet Eustace Quail, DO    OT End of Session - 04/06/22 1256     Visit Number 7    Number of Visits 17    Date for OT Re-Evaluation 05/05/22    Authorization Type [employer]    OT Start Time 1104    OT Stop Time 1144    OT Time Calculation (min) 40 min                   Past Medical History:  Diagnosis Date   Anxiety    Arthritis    GERD (gastroesophageal reflux disease)    occ remote history   Hypertension    Parkinson disease    Prostate cancer ([place])    Right knee pain    questionable meniscal tear   Seasonal allergies    Past Surgical History:  Procedure Laterality Date   COLONOSCOPY     Leg Laceration Repair  Left    Age 80   PELVIC LYMPH NODE DISSECTION Bilateral 03/22/2018   Procedure: PELVIC LYMPH NODE DISSECTION;  Surgeon: Ceasar Mons, MD;  [facility]: [facility];  Service: Urology;  Laterality: Bilateral;   PROSTATE BIOPSY     ROBOT ASSISTED LAPAROSCOPIC RADICAL PROSTATECTOMY N/A 03/22/2018   Procedure: XI ROBOTIC ASSISTED LAPAROSCOPIC PROSTATECTOMY;  Surgeon: Ceasar Mons, MD;  Location: [facility];  Service: Urology;  Laterality: N/A;   TOTAL KNEE ARTHROPLASTY Right 05/05/2019   Procedure: RIGHT TOTAL KNEE ARTHROPLASTY;  Surgeon: Frederik Pear, MD;  Location: [facility];  Service: Orthopedics;  Laterality: Right;   Patient Active Problem List   Diagnosis Date Noted   S/P TKR (total knee replacement), right 05/05/2019   Osteoarthritis of right knee 05/02/2019   Prostate cancer ([place]) 03/22/2018   Malignant neoplasm of prostate ([place]) 02/27/2018    ONSET DATE: referral 03/02/22  REFERRING DIAG: G20.A2 (ICD-10-CM) - Parkinson's disease without dyskinesia, with fluctuating manifestations   THERAPY DIAG:  Other symptoms and signs  involving the nervous system  Other lack of coordination  Muscle weakness (generalized)  Unsteadiness on feet  Rationale for Evaluation and Treatment Rehabilitation  SUBJECTIVE:   SUBJECTIVE STATEMENT: Pt reports that his wife spoke with someone from Dr. Doristine Devoid office and they are looking to get him an appt prior to his next scheduled appointment. Pt accompanied by: self and spouse dropped pt off  PERTINENT HISTORY: anxiety, arthritis, GERD, HTN, R TKR  PRECAUTIONS: Fall  WEIGHT BEARING RESTRICTIONS No  PAIN:  Are you having pain? No  FALLS: Has patient fallen in last 6 months? Yes. Number of falls 2, fell last night  PLOF: Requires assistive device for independence and Needs assistance with ADLs  PATIENT GOALS to get as close to normal as possible  OBJECTIVE:   HAND DOMINANCE: Right   FUNCTIONAL OUTCOME MEASURES: Physical performance test: PPT #2 (self-feeding):17.66 sec *     PPT #4 (jacket on/off): Attempted but pt unable to complete due to increased lightheadedness in standing and difficulty with previously educated "cape technique"     3 button/unbutton: 2:56.5  COORDINATION: From screen on 03/02/22 9 Hole Peg test: Right: 49.13 sec; Left: 44.53 sec  MUSCLE TONE: RUE: Mild and LUE: Mild  COGNITION: slower processing overall  VISION: Baseline vision: Bifocals and Wears glasses all the time   TODAY'S TREATMENT:   04/06/22 Hip stretch: engaged  in figure 4 stretch with hold ~30 seconds bilaterally.  Pt asking about hip stretch that he completes during PD dance class.  OT providing demonstration, verbal and tactile cues for improved positioning and incorporating trunk extension to further facilitate hip flexion to compensate for decreased hip extension.  Exercises completed to aid in LB dressing.   Large amplitude: engaged in 2 sets of 10 forward reach with large amplitude wrist extension and hand opening.  OT providing min cues for technique.  Engaged in PNF D2  reaching with focus on large amplitude and full opening of hand during upward and downward reach.  OT providing demonstration and verbal cues.  Pt with improved reach and amplitude when visually attending to task. Self-feeding: Educated on AE to aid in self-feeding such as scoop plate, use of knife to scoop towards, and showed rocker knife.  Attempted scooping food with use of knife as barrier vs scoop plate edge. Pt continues to demonstrate forward flexion to decrease space between plate and mouth.  OT encouraging increased upright posture to not reinforce small repetitive movements.  Utilized variety of mediums to attempt to simulate food textures with pt still spilling but reporting understanding of modifications and suggestions with plans to assess scooping technique at home. Reaching: pt reaching ~120-125* with RUE, utilizing LUE as gross assist to stabilize underside of larger items when removing items from cabinet with BUE. Pt continues to demonstrate decreased functional shoulder ROM.    04/04/22 ADL: discussed fall experienced last night.  Pt with multiple questions about medications and increased experiences of sleepiness and near falling asleep during standing.  OT encouraged pt to speak with MD regarding pt concerns, especially as pt had fall yesterday. Sleep hygiene: reporting getting up 2-3 times per night to urinate.  Then taking ~30 mins to fall back asleep after toileting.  Sometimes falling asleep while seated on the toilet.   Large amplitude: engaged in reaching behind head with ball clockwise and then counter clockwise.  OT providing demonstration and verbal cues for technique to facilitate increased ROM when reaching behind head.  Transitioned to reaching around torso with ball. OT providing demonstration and cues for increased amplitude reaching prior to reaching behind back to facilitate increased ROM. Pt doffed vest to allow for increased ROM. ADL: Pt with significant challenges  with removing vest.  OT providing demonstration and verbal cues for increased hand placement to allow for large amplitude movements for increased ease with doffing vest.  Pt getting stuck with vest still on shoulder, benefiting from increased time/effort and min assist from therapist. Large amplitude reaching: incorporated horizontal and posterior reaching with focus on large amplitude reaching.  Increased challenge with cues for increased weight shift and large amplitude during reaching.  OT educating on increased hand placement on cones to facilitate increased motor control.  Reiterated functional carryover of movements to carry over to dressing tasks/toileting hygiene.    03/29/22 Simulated LB dressing: Pt continues to demonstrate decreased lift off and clearance of foot when attempting to don pants.  Reiterated use of AE to aid in increased reach to don pants.  OT providing demonstration for use of reacher with donning pants.   Therapeutic exercise: engaged in large amplitude stepping out to side while seated on mat with focus on increased lift off and clearance of foot during exercise to carry over to LB dressing.  OT providing colored dots on floor for feedback for large amplitude of side step. Figure 4 stretch: Pt demonstrating significant difficulty achieving position  R > L but ultimately able to do.  Educated on stretch in this position for hip flexibility. Pt able to hold position ~30 seconds both at midline and with side stretch. Large amplitude reaching: Seated finger flicks with elbows extended.  OT providing demonstration and verbal cues, utilizing mirror for visual feedback for increased amplitude.  Increased challenge to completing in standing, incorporating forward reaching and reaching to R and L.  Min cues for technique, especially drawing attention to wrist extension.   Large amplitude reaching: incorporated vertical as well as posterior reaching with focus on large amplitude reaching.   Increased challenge with more vertical height and when rotating to place items on mat behind pt.  Reiterated functional carryover of movements.      PATIENT EDUCATION: Education details: Educated on role and purpose of OT as well as potential interventions and goals for therapy based on initial evaluation findings. Person educated: Patient Education method: Explanation Education comprehension: verbalized understanding   HOME EXERCISE PROGRAM: Bag exercises - see pt instructions    GOALS: Goals reviewed with patient? Yes  SHORT TERM GOALS: Target date: 04/07/22  Pt will be Independent with PD specific HEP Baseline: Goal status: MET - reports completing 2-3x/week  2.  Pt will verbalize understanding of adapted strategies and DME/AE PRN (reacher, rocker knife, scoop plate/plate guard, etc) to maximize safety and independence with ADLs/IADLs. Baseline:  Goal status: IN PROGRESS - ongoing 04/06/22  3.  Pt will demonstrate improved shoulder flexion bilaterally to retreive a lightweight object from overhead shelf with BUE at >/= 130 shoulder flexion. Baseline:  Goal status: NOT MET - Right: 120* and Left: 124*  LONG TERM GOALS: Target date: 05/05/22  Pt will demonstrate increased functional ROM and use of BUE to aid in clothing management and hygiene with toileting tasks. Baseline:  Goal status: IN PROGRESS  2.  Pt will demonstrate improved fine motor coordination for ADLs as evidenced by decreasing 9 hole peg test score for RUE by 3 secs  Baseline: 9 Hole Peg test: Right: 49.13 sec; Left: 44.53 sec Goal status: IN PROGRESS  3.  Pt will demonstrate increased ease with dressing as evidenced by decreasing PPT#4(don/ doff jacket) by 30 seconds  Baseline: time TBA at next session Goal status: IN PROGRESS  4.  Pt will demonstrate improved ease with feeding as evidenced by decreasing PPT#2 (self feeding) by 3 secs  Baseline: 17.66 sec Goal status: IN PROGRESS  5. Pt will  demonstrate and/or report improved independence with LB dressing with use of AE/DME as needed.  Baseline:  Goal status: IN PROGRESS   ASSESSMENT:  CLINICAL IMPRESSION: Treatment session with focus on large amplitude movements as needed for UB/LB dressing and toileting needs as well as functional overhead reach as needed for ADLs/IADLs.  Pt continues to demonstrate significant slowing in mobility and quality of movement as evident with aspects of dressing tasks, functional reach, and impacting functional mobility.  Pt receptive to modifications of stretches from dance class with improved stretch as well as modifications/AE to increase ease and success with self-feeding.  PERFORMANCE DEFICITS in functional skills including ADLs, IADLs, coordination, tone, ROM, strength, flexibility, FMC, GMC, balance, body mechanics, endurance, decreased knowledge of use of DME, and UE functional use and psychosocial skills including environmental adaptation and routines and behaviors.   IMPAIRMENTS are limiting patient from ADLs and IADLs.   COMORBIDITIES may have co-morbidities  that affects occupational performance. Patient will benefit from skilled OT to address above impairments and improve overall function.  MODIFICATION OR ASSISTANCE TO COMPLETE EVALUATION: Min-Moderate modification of tasks or assist with assess necessary to complete an evaluation.  OT OCCUPATIONAL PROFILE AND HISTORY: Detailed assessment: Review of records and additional review of physical, cognitive, psychosocial history related to current functional performance.  CLINICAL DECISION MAKING: Moderate - several treatment options, min-mod task modification necessary  REHAB POTENTIAL: Good  EVALUATION COMPLEXITY: Moderate    PLAN: OT FREQUENCY: 2x/week  OT DURATION: 8 weeks  PLANNED INTERVENTIONS: self care/ADL training, therapeutic exercise, therapeutic activity, neuromuscular re-education, manual therapy, passive range of  motion, balance training, functional mobility training, ultrasound, compression bandaging, moist heat, cryotherapy, patient/family education, cognitive remediation/compensation, energy conservation, and DME and/or AE instructions  RECOMMENDED OTHER SERVICES: N/A  CONSULTED AND AGREED WITH PLAN OF CARE: Patient  PLAN FOR NEXT SESSION: Initiate/review large amplitude HEP, Review and add to bag exercises for dressing tasks - complete in front of mirror for feedback, introduce AE/DME for increased safety/independence with bathing/dressing tasks.  Continue to assess AE and self-feeding.  Continue to check in on quality of sleep.   Simonne Come, OTR/L 04/06/2022, 12:56 PM

## 2022-04-07 NOTE — Therapy (Signed)
OUTPATIENT PHYSICAL THERAPY NEURO PROGRESS NOTE   Patient Name: Matthew [name] MRN: [id number] DOB:1950/08/13, 71 y.o., male Today's Date: 03/09/2022   Progress Note Reporting Period 03/09/22 to 04/10/22  See note below for Objective Data and Assessment of Progress/Goals.     PCP: Lawerance Cruel, MD REFERRING PROVIDER: Carles Collet Eustace Quail, DO    PT End of Session - 04/10/22 1529     Visit Number 10    Number of Visits 16    Date for PT Re-Evaluation 05/22/22    Authorization Type [employer]    Progress Note Due on Visit 20    PT Start Time 1446    PT Stop Time 1526    PT Time Calculation (min) 40 min    Equipment Utilized During Treatment Gait belt    Activity Tolerance Patient tolerated treatment well    Behavior During Therapy WFL for tasks assessed/performed                   Past Medical History:  Diagnosis Date   Anxiety    Arthritis    GERD (gastroesophageal reflux disease)    occ remote history   Hypertension    Parkinson disease    Prostate cancer ([place])    Right knee pain    questionable meniscal tear   Seasonal allergies    Past Surgical History:  Procedure Laterality Date   COLONOSCOPY     Leg Laceration Repair  Left    Age 23   PELVIC LYMPH NODE DISSECTION Bilateral 03/22/2018   Procedure: PELVIC LYMPH NODE DISSECTION;  Surgeon: Ceasar Mons, MD;  Location: [facility];  Service: Urology;  Laterality: Bilateral;   PROSTATE BIOPSY     ROBOT ASSISTED LAPAROSCOPIC RADICAL PROSTATECTOMY N/A 03/22/2018   Procedure: XI ROBOTIC ASSISTED LAPAROSCOPIC PROSTATECTOMY;  Surgeon: Ceasar Mons, MD;  Location: [facility];  Service: Urology;  Laterality: N/A;   TOTAL KNEE ARTHROPLASTY Right 05/05/2019   Procedure: RIGHT TOTAL KNEE ARTHROPLASTY;  Surgeon: Frederik Pear, MD;  Location: [facility];  Service: Orthopedics;  Laterality: Right;   Patient Active Problem List   Diagnosis Date Noted   S/P TKR (total knee replacement), right  05/05/2019   Osteoarthritis of right knee 05/02/2019   Prostate cancer ([place]) 03/22/2018   Malignant neoplasm of prostate ([place]) 02/27/2018    ONSET DATE: 2-3 years  REFERRING DIAG: G20.A2 (ICD-10-CM) - Parkinson's disease without dyskinesia, with fluctuating manifestations   THERAPY DIAG:  Other symptoms and signs involving the nervous system  Muscle weakness (generalized)  Unsteadiness on feet  Other abnormalities of gait and mobility  Rationale for Evaluation and Treatment Rehabilitation  SUBJECTIVE:  SUBJECTIVE STATEMENT: Doing pretty good.  Denies recent falls. Reports that he didn't do his HEP this weekend- too many things going on.   Pt accompanied by: self  PERTINENT HISTORY: Parkinson's, right TKR,   PAIN:  Are you having pain? No  PRECAUTIONS: None  WEIGHT BEARING RESTRICTIONS No  FALLS: Has patient fallen in last 6 months? Yes. Number of falls 2-3 , unable to perform fall recovery  LIVING ENVIRONMENT: Lives with: lives with their spouse Lives in: House/apartment Stairs: Yes: Internal: 16 steps; can reach both and External: 2-3 steps; on left going up Has following equipment at home: Walker - 4 wheeled  PLOF: Independent with household mobility with device  PATIENT GOALS "get close to normal as possible"  -Be able to walk without fear of falling -Be able to throw horseshoes/darts -Be able to maintain walking program with trekking poles   OBJECTIVE:      TODAY'S TREATMENT: 04/10/22 Activity Comments  Mini Best test  18  10 M walk 2.19 ft/sec  Floor transfer  Verbal cueing for sequencing; performed with supervision    [facility] PT Assessment - 04/10/22 0001       Standardized Balance Assessment   Standardized Balance Assessment Timed Up and Go Test;10 meter walk  test    10 Meter Walk 14.92 sec with 4WW   2.19 ft/sec     Mini-BESTest   Sit To Stand Normal: Comes to stand without use of hands and stabilizes independently.    Rise to Toes Moderate: Heels up, but not full range (smaller than when holding hands), OR noticeable instability for 3 s.    Stand on one leg (left) Moderate: < 20 s   7 sec   Stand on one leg (right) Normal: 20 s.    Stand on one leg - lowest score 1    Compensatory Stepping Correction - Forward Normal: Recovers independently with a single, large step (second realignement is allowed).    Compensatory Stepping Correction - Backward No step, OR would fall if not caught, OR falls spontaneously.    Compensatory Stepping Correction - Left Lateral Severe: Falls, or cannot step    Compensatory Stepping Correction - Right Lateral Normal: Recovers independently with 1 step (crossover or lateral OK)    Stepping Corredtion Lateral - lowest score 0    Stance - Feet together, eyes open, firm surface  Normal: 30s    Stance - Feet together, eyes closed, foam surface  Normal: 30s    Incline - Eyes Closed Normal: Stands independently 30s and aligns with gravity   required 2 trials   Change in Gait Speed Normal: Significantly changes walkling speed without imbalance    Walk with head turns - Horizontal Moderate: performs head turns with reduction in gait speed.    Walk with pivot turns Severe: Cannot turn with feet close at any speed without imbalance.    Step over obstacles Normal: Able to step over box with minimal change of gait speed and with good balance.    Timed UP & GO with Dual Task Moderate: Dual Task affects either counting OR walking (>10%) when compared to the TUG without Dual Task.    Mini-BEST total score 18      Timed Up and Go Test   Normal TUG (seconds) 14.43   with 4WW   Manual TUG (seconds) 20.17    Cognitive TUG (seconds) 18.19    TUG Comments 40% increase in manual TUG, 26% in cognitive TUG  PATIENT  EDUCATION: Education details: edu on exam findings and remaining impairments; POC; encouraged improved HEP compliance  Person educated: Patient Education method: Explanation, Demonstration, Tactile cues, Verbal cues, and Handouts Education comprehension: verbalized understanding and returned demonstration   HOME EXERCISE PROGRAM Last updated: 04/04/22 Access Code: [email or web address] URL: [email or web address] Date: 04/04/2022 Prepared by: Delta with Kettlebell  - 1 x daily - 7 x weekly - 3 sets - 10 reps - Sumo Squat with Dumbbell  - 1 x daily - 7 x weekly - 3 sets - 10 reps - Step Taps on High Step  - 1 x daily - 5 x weekly - 3 sets - 10 reps - Forward and Backward Step Over with Counter Support  - 1 x daily - 5 x weekly - 3 sets - 10 reps    Below measures were taken at time of initial evaluation unless otherwise specified:   DIAGNOSTIC FINDINGS: none recently  COGNITION: Overall cognitive status: Within functional limits for tasks assessed   SENSATION: WFL  COORDINATION: Grossly intact with some deficits in rapid, alternating and divided movement tasks  EDEMA:    MUSCLE TONE: some rigidity noted in BLE   MUSCLE LENGTH: Knee flexion contractures  DTRs:    POSTURE: forward head  LOWER EXTREMITY ROM:     Active  Right Eval Left Eval  Hip flexion    Hip extension    Hip abduction    Hip adduction    Hip internal rotation    Hip external rotation    Knee flexion    Knee extension -10 -10  Ankle dorsiflexion 5 5  Ankle plantarflexion    Ankle inversion    Ankle eversion     (Blank rows = not tested)  LOWER EXTREMITY MMT:    Grossly 4/5 BLE  BED MOBILITY:  NT  TRANSFERS: Assistive device utilized: Quad cane large base and None  Sit to stand: Complete Independence Stand to sit: Complete Independence Chair to chair: Modified independence Floor:  NT , pt report need for  total assist at this time  RAMP:  NT  CURB:  Level of Assistance: SBA Assistive device utilized: Environmental consultant - 4 wheeled Curb Comments: increased time for processing and negotiation  STAIRS:  Level of Assistance: Modified Artist Technique: Alternating Pattern  with Bilateral Rails  Number of Stairs: 5   Height of Stairs: 4-6"  Comments:   GAIT: Gait pattern: shuffling, decreased stride Distance walked:  Assistive device utilized: Environmental consultant - 4 wheeled Level of assistance: SBA Comments:   FUNCTIONAL TESTs:  5 times sit to stand: 11 sec Timed up and go (TUG): 15 sec Mini-BEST test: 14/28 Gait Speed: 16.28 sec = 2.01 ft/sec  PATIENT SURVEYS:     PATIENT EDUCATION: Education details: assessment findings Person educated: Patient Education method: Explanation Education comprehension: verbalized understanding and needs further education   HOME EXERCISE PROGRAM: TBD    GOALS: Goals reviewed with patient? Yes  SHORT TERM GOALS: Target date: 03/31/2022  Patient will be able to perform home program independently for self-management. Baseline: Added to HEP 03/29/2022 Goal status: IN PROGRESS  2.  Improve safety with mobility per time 12 sec TUG to reduce risk for falls Baseline: 15 sec w/ 4WW Goal status:GOAL NOT MET-15.78 sec at best   LONG TERM GOALS: Target date: 05/22/2022  Improve safety and reduce risk for falls with mobility per sore 22/28 Mini-BEST test Baseline: 14/28;  18/28 04/10/22 Goal status: IN PROGRESS 04/10/22  2.  Demonstrate increased gait speed to 3 ft/sec to improve efficiency with community ambulation  Baseline: 2 ft/sec; 2.19 ft/sec 04/10/22 Goal status: IN PROGRESS 04/10/22   3.  Demo supervision level for floor to stand transfers to improve functional mobility and facilitate participation in PD-specific exercise class Baseline: NT, pt reports total assist at this time Goal status: MET 04/10/22  4.  Demonstrate  coordination and ability to perform throwing tasks to facilitate participation in leisure activities (horseshoes and darts) Baseline: reports unable to perform Goal status: DEFERRED for OT    ASSESSMENT:  CLINICAL IMPRESSION: Patient arrived to session without new complains. Denies recent falls however also admits to limited HEP adherence. Patient scored 18 on Mini Best test, improved from initial assessment but still indicating an increased risk of falls. Most difficulty was evident with turns and balance recovery in backwards and sideways directions. Patient's gait speed with RW is now 2.19 ft/sec, indicating a very slight improvement in pace.  Able to perform floor transfer with verbal cueing for positioning and sequencing, and supervision. Patient is demonstrating good progress towards all goals. Would benefit from additional skilled PT services 1x/week for 6 weeks to address remaining goals.    OBJECTIVE IMPAIRMENTS Abnormal gait, decreased activity tolerance, decreased balance, decreased coordination, decreased mobility, difficulty walking, decreased ROM, decreased strength, impaired flexibility, improper body mechanics, and postural dysfunction.   ACTIVITY LIMITATIONS carrying, lifting, bending, squatting, transfers, self feeding, and locomotion level  PARTICIPATION LIMITATIONS: cleaning, interpersonal relationship, shopping, community activity, yard work, and leisure activities  [place] Age, Time since onset of injury/illness/exacerbation, and 1 comorbidity: PD, TKR  are also affecting patient's functional outcome.   REHAB POTENTIAL: Good  CLINICAL DECISION MAKING: Evolving/moderate complexity  EVALUATION COMPLEXITY: Moderate  PLAN: PT FREQUENCY: 1x/week  PT DURATION: 6 weeks  PLANNED INTERVENTIONS: Therapeutic exercises, Therapeutic activity, Neuromuscular re-education, Balance training, Gait training, Patient/Family education, Self Care, Joint mobilization, Stair  training, Vestibular training, Canalith repositioning, Orthotic/Fit training, DME instructions, Aquatic Therapy, Dry Needling, Electrical stimulation, Cryotherapy, Moist heat, Taping, and Manual therapy  PLAN FOR NEXT SESSION: Continue to work on transfers from quadruped to tall kneeling/ half kneeling; continue gait with RW: floor transfer, LARGE amplitude movement patterns, BIG effort to initiate movements   Janene Harvey, PT, DPT 04/10/22 3:31 PM  [facility] at [facility] [address], [place], [place] [address] Phone # [phone] Fax # [phone]

## 2022-04-10 ENCOUNTER — Encounter: Payer: Self-pay | Admitting: Physical Therapy

## 2022-04-10 ENCOUNTER — Ambulatory Visit: Payer: PPO | Admitting: Physical Therapy

## 2022-04-10 ENCOUNTER — Ambulatory Visit: Payer: PPO | Admitting: Occupational Therapy

## 2022-04-10 DIAGNOSIS — M6281 Muscle weakness (generalized): Secondary | ICD-10-CM

## 2022-04-10 DIAGNOSIS — R2681 Unsteadiness on feet: Secondary | ICD-10-CM

## 2022-04-10 DIAGNOSIS — R2689 Other abnormalities of gait and mobility: Secondary | ICD-10-CM

## 2022-04-10 DIAGNOSIS — R278 Other lack of coordination: Secondary | ICD-10-CM

## 2022-04-10 DIAGNOSIS — R29818 Other symptoms and signs involving the nervous system: Secondary | ICD-10-CM

## 2022-04-10 NOTE — Therapy (Signed)
OUTPATIENT OCCUPATIONAL THERAPY  Treatment Note  Patient Name: Matthew [name] MRN: [id number] DOB:1950/10/27, 71 y.o., male Today's Date: 04/10/2022  PCP: Lawerance Cruel, MD REFERRING PROVIDER: Carles Collet Eustace Quail, DO    OT End of Session - 04/10/22 1405     Visit Number 8    Number of Visits 17    Date for OT Re-Evaluation 05/05/22    Authorization Type [employer]    OT Start Time 1404    OT Stop Time 4098    OT Time Calculation (min) 41 min                   Past Medical History:  Diagnosis Date   Anxiety    Arthritis    GERD (gastroesophageal reflux disease)    occ remote history   Hypertension    Parkinson disease    Prostate cancer ([place])    Right knee pain    questionable meniscal tear   Seasonal allergies    Past Surgical History:  Procedure Laterality Date   COLONOSCOPY     Leg Laceration Repair  Left    Age 67   PELVIC LYMPH NODE DISSECTION Bilateral 03/22/2018   Procedure: PELVIC LYMPH NODE DISSECTION;  Surgeon: Ceasar Mons, MD;  Location: [facility];  Service: Urology;  Laterality: Bilateral;   PROSTATE BIOPSY     ROBOT ASSISTED LAPAROSCOPIC RADICAL PROSTATECTOMY N/A 03/22/2018   Procedure: XI ROBOTIC ASSISTED LAPAROSCOPIC PROSTATECTOMY;  Surgeon: Ceasar Mons, MD;  Location: [facility];  Service: Urology;  Laterality: N/A;   TOTAL KNEE ARTHROPLASTY Right 05/05/2019   Procedure: RIGHT TOTAL KNEE ARTHROPLASTY;  Surgeon: Frederik Pear, MD;  Location: [facility];  Service: Orthopedics;  Laterality: Right;   Patient Active Problem List   Diagnosis Date Noted   S/P TKR (total knee replacement), right 05/05/2019   Osteoarthritis of right knee 05/02/2019   Prostate cancer ([place]) 03/22/2018   Malignant neoplasm of prostate ([place]) 02/27/2018    ONSET DATE: referral 03/02/22  REFERRING DIAG: G20.A2 (ICD-10-CM) - Parkinson's disease without dyskinesia, with fluctuating manifestations   THERAPY DIAG:  Other symptoms and signs  involving the nervous system  Other lack of coordination  Muscle weakness (generalized)  Unsteadiness on feet  Other abnormalities of gait and mobility  Rationale for Evaluation and Treatment Rehabilitation  SUBJECTIVE:   SUBJECTIVE STATEMENT: Pt reports that he had to put on a button up shirt on over the weekend. Pt accompanied by: self and spouse dropped pt off  PERTINENT HISTORY: anxiety, arthritis, GERD, HTN, R TKR  PRECAUTIONS: Fall  WEIGHT BEARING RESTRICTIONS No  PAIN:  Are you having pain? No  FALLS: Has patient fallen in last 6 months? Yes. Number of falls 2, fell last night  PLOF: Requires assistive device for independence and Needs assistance with ADLs  PATIENT GOALS to get as close to normal as possible  OBJECTIVE:   HAND DOMINANCE: Right   FUNCTIONAL OUTCOME MEASURES: Physical performance test: PPT #2 (self-feeding):17.66 sec *     PPT #4 (jacket on/off): Attempted but pt unable to complete due to increased lightheadedness in standing and difficulty with previously educated "cape technique"     3 button/unbutton: 2:56.5  COORDINATION: From screen on 03/02/22 9 Hole Peg test: Right: 49.13 sec; Left: 44.53 sec  MUSCLE TONE: RUE: Mild and LUE: Mild  COGNITION: slower processing overall  VISION: Baseline vision: Bifocals and Wears glasses all the time   TODAY'S TREATMENT:   04/10/22 Buttons: Pt completing finger flicks and thumb  opposition flicks prior to engaging in buttons.  Pt then completing with mod-max difficulty due to decreased strategy and attempting to pull button instead of pushing through hole.  Pt required 4:24 to complete buttoning/unbuttoning of 3 buttons.  OT educating on alternative method with pt completing with blocked practice, however still with inconsistent improvements.   AE: Educated on use of AE/button hook OT providing verbal and demonstration cues.  After practice, pt able to complete 3 button/unbutton in 1:12 with use of  button hook.  OT provided pt with handout for purchase if desired.      04/06/22 Hip stretch: engaged in figure 4 stretch with hold ~30 seconds bilaterally.  Pt asking about hip stretch that he completes during PD dance class.  OT providing demonstration, verbal and tactile cues for improved positioning and incorporating trunk extension to further facilitate hip flexion to compensate for decreased hip extension.  Exercises completed to aid in LB dressing.   Large amplitude: engaged in 2 sets of 10 forward reach with large amplitude wrist extension and hand opening.  OT providing min cues for technique.  Engaged in PNF D2 reaching with focus on large amplitude and full opening of hand during upward and downward reach.  OT providing demonstration and verbal cues.  Pt with improved reach and amplitude when visually attending to task. Self-feeding: Educated on AE to aid in self-feeding such as scoop plate, use of knife to scoop towards, and showed rocker knife.  Attempted scooping food with use of knife as barrier vs scoop plate edge. Pt continues to demonstrate forward flexion to decrease space between plate and mouth.  OT encouraging increased upright posture to not reinforce small repetitive movements.  Utilized variety of mediums to attempt to simulate food textures with pt still spilling but reporting understanding of modifications and suggestions with plans to assess scooping technique at home. Reaching: pt reaching ~120-125* with RUE, utilizing LUE as gross assist to stabilize underside of larger items when removing items from cabinet with BUE. Pt continues to demonstrate decreased functional shoulder ROM.    04/04/22 ADL: discussed fall experienced last night.  Pt with multiple questions about medications and increased experiences of sleepiness and near falling asleep during standing.  OT encouraged pt to speak with MD regarding pt concerns, especially as pt had fall yesterday. Sleep hygiene:  reporting getting up 2-3 times per night to urinate.  Then taking ~30 mins to fall back asleep after toileting.  Sometimes falling asleep while seated on the toilet.   Large amplitude: engaged in reaching behind head with ball clockwise and then counter clockwise.  OT providing demonstration and verbal cues for technique to facilitate increased ROM when reaching behind head.  Transitioned to reaching around torso with ball. OT providing demonstration and cues for increased amplitude reaching prior to reaching behind back to facilitate increased ROM. Pt doffed vest to allow for increased ROM. ADL: Pt with significant challenges with removing vest.  OT providing demonstration and verbal cues for increased hand placement to allow for large amplitude movements for increased ease with doffing vest.  Pt getting stuck with vest still on shoulder, benefiting from increased time/effort and min assist from therapist. Large amplitude reaching: incorporated horizontal and posterior reaching with focus on large amplitude reaching.  Increased challenge with cues for increased weight shift and large amplitude during reaching.  OT educating on increased hand placement on cones to facilitate increased motor control.  Reiterated functional carryover of movements to carry over to dressing tasks/toileting hygiene.  PATIENT EDUCATION: Education details: educated on large amplitude movements/finger flicks before [place] tasks, and edu on AE for increased ease with buttons Person educated: Patient Education method: Explanation Education comprehension: verbalized understanding   HOME EXERCISE PROGRAM: Bag exercises - see pt instructions    GOALS: Goals reviewed with patient? Yes  SHORT TERM GOALS: Target date: 04/07/22  Pt will be Independent with PD specific HEP Baseline: Goal status: MET - reports completing 2-3x/week  2.  Pt will verbalize understanding of adapted strategies and DME/AE PRN (reacher, rocker knife,  scoop plate/plate guard, etc) to maximize safety and independence with ADLs/IADLs. Baseline:  Goal status: IN PROGRESS - ongoing 04/06/22  3.  Pt will demonstrate improved shoulder flexion bilaterally to retreive a lightweight object from overhead shelf with BUE at >/= 130 shoulder flexion. Baseline:  Goal status: NOT MET - Right: 120* and Left: 124*  LONG TERM GOALS: Target date: 05/05/22  Pt will demonstrate increased functional ROM and use of BUE to aid in clothing management and hygiene with toileting tasks. Baseline:  Goal status: IN PROGRESS  2.  Pt will demonstrate improved fine motor coordination for ADLs as evidenced by decreasing 9 hole peg test score for RUE by 3 secs  Baseline: 9 Hole Peg test: Right: 49.13 sec; Left: 44.53 sec Goal status: IN PROGRESS  3.  Pt will demonstrate increased ease with dressing as evidenced by decreasing PPT#4(don/ doff jacket) by 30 seconds  Baseline: time TBA at next session Goal status: IN PROGRESS  4.  Pt will demonstrate improved ease with feeding as evidenced by decreasing PPT#2 (self feeding) by 3 secs  Baseline: 17.66 sec Goal status: IN PROGRESS  5. Pt will demonstrate and/or report improved independence with LB dressing with use of AE/DME as needed.  Baseline:  Goal status: IN PROGRESS   ASSESSMENT:  CLINICAL IMPRESSION: Treatment session with focus on large amplitude movements prior to engaging in [facility] tasks.  Pt continues to demonstrate significant slowing in mobility and quality of movement impacting managing buttons and donning/doffing jacket, as well as impacting functional mobility.  Pt receptive to education on AE (buttonhook) for managing buttons with increased ease.  PERFORMANCE DEFICITS in functional skills including ADLs, IADLs, coordination, tone, ROM, strength, flexibility, FMC, GMC, balance, body mechanics, endurance, decreased knowledge of use of DME, and UE functional use and psychosocial skills including  environmental adaptation and routines and behaviors.   IMPAIRMENTS are limiting patient from ADLs and IADLs.   COMORBIDITIES may have co-morbidities  that affects occupational performance. Patient will benefit from skilled OT to address above impairments and improve overall function.  MODIFICATION OR ASSISTANCE TO COMPLETE EVALUATION: Min-Moderate modification of tasks or assist with assess necessary to complete an evaluation.  OT OCCUPATIONAL PROFILE AND HISTORY: Detailed assessment: Review of records and additional review of physical, cognitive, psychosocial history related to current functional performance.  CLINICAL DECISION MAKING: Moderate - several treatment options, min-mod task modification necessary  REHAB POTENTIAL: Good  EVALUATION COMPLEXITY: Moderate    PLAN: OT FREQUENCY: 2x/week  OT DURATION: 8 weeks  PLANNED INTERVENTIONS: self care/ADL training, therapeutic exercise, therapeutic activity, neuromuscular re-education, manual therapy, passive range of motion, balance training, functional mobility training, ultrasound, compression bandaging, moist heat, cryotherapy, patient/family education, cognitive remediation/compensation, energy conservation, and DME and/or AE instructions  RECOMMENDED OTHER SERVICES: N/A  CONSULTED AND AGREED WITH PLAN OF CARE: Patient  PLAN FOR NEXT SESSION: Initiate/review large amplitude HEP, Review and add to bag exercises for dressing tasks - complete in front of mirror  for feedback, introduce AE/DME for increased safety/independence with bathing/dressing tasks.  Continue to assess AE and self-feeding.  Continue to check in on quality of sleep.   Simonne Come, OTR/L 04/10/2022, 2:05 PM

## 2022-04-11 NOTE — Progress Notes (Signed)
Assessment/Plan:   1.  Parkinsons Disease  -Continue carbidopa/levodopa 25/100, 2 tablets at 9 AM/noon/3 PM/6 PM  -Continue carbidopa/levodopa 50/200 CR at bedtime  -I think that the eds is do to excessive dopamine.  He is not only on the above but taking a lot of restore gold.  I really would like to see him off of this medication.  Its confusing things and making it difficult to tx.  I think he believes eds means underdosed but suspect that this is just the opposite.  -d/c entacapone.  Suspect that any side effect issues it was causing are due to excess levodopa.  Explained mechanism of action of entacapone.  -Samples of Inbrija were given to use in place of his restore gold.  Explained how to use that and explained that he could use that up to 5 times per day in addition to his levodopa.  -at this point, I would like to see all of the supplements be d/c.  -discussed walker at all times  -discussed that the masked face/vivid dreams/PBA are all part of the disease and not med SE.    2.  Sialorrhea  -he has started myobloc recently and it seems to be helping  3.  Dysphagia  -discussed mbe.  We will proceed   Subjective:   Matthew [name] was seen today in follow up for Parkinsons disease.  My previous records were reviewed prior to todays visit as well as outside records available to me.  Wife with patient and supplements hx.   he requested a work in appointment today.  Wife feels that he is having medication SE.   She felt that he was too drowsy with falls.  Review of prior records indicate that this was actually the complaint on several other medications, and was actually the reason we started the selegiline last visit.  He did have his last injection of Myobloc for sialorrhea on November 10 and they report that its helped.  He stopped the selegeline since our last visit.  Wife states that she thinks it caused dizziness.  Our records indicated that pt thought it just didn't help with EDS.   Either way, he is off of it now.   He is also now only on entacapone twice per day.  Wife thinks that this medication causes falling asleep.  When he backed down to bid dosing, the EDS was some better.   We discussed the restore gold last visit.  They tried to reduce it and got it to 2 po tid and felt like he did worse and they increased it back to 3 po tid.  He is off of the neuroshine.  Wife states that he gets paranoid and excited and thinks maybe that is the entacapone.  He has emotional lability.  His dreams are active.  He has masked facies.  Current prescribed movement disorder medications: carbidopa/levodopa 25/100, 2 tablets at 9am/12noon/3pm/6pm. Carbidopa/levodopa 50/200 CR at bed (added last visit) Entacapone, 200 mg 3 times per day with first 3 dosages of levodopa Selegiline, 5 mg twice per day (at last visit)  PREVIOUS MEDICATIONS: Requip (headache, drowsiness, incontinence at only 0.25 mg dose); rotigotine (feet/leg swelling); pramipexole (reported feet/ankles swelling at low-dose); selegeline (they thought it caused dizziness)  ALLERGIES:   Allergies  Allergen Reactions   Other     Senior dose of flu shot, cause hear palpitations, anxious   Peanut-Containing Drug Products Other (See Comments)    Nasal congestion   Ropinirole Other (See Comments)  Headache drowsiness trouble urinating     CURRENT MEDICATIONS:  Outpatient Encounter Medications as of 04/17/2022  Medication Sig   Ascorbic Acid (VITAMIN C) 1000 MG tablet Take 1,000 mg by mouth daily.   Botulinum Toxin Type B (MYOBLOC) 5000 UNIT/ML SOLN Inject 5000 units into the face and neck every 120 days by the doctor   calcium carbonate (OS-CAL) 1250 (500 Ca) MG chewable tablet Chew 1 tablet by mouth daily.   carbidopa-levodopa (SINEMET CR) 50-200 MG tablet TAKE ONE TABLET BY MOUTH EVERYDAY AT BEDTIME   carbidopa-levodopa (SINEMET IR) 25-100 MG tablet TAKE TWO TABLETS BY MOUTH FOUR TIMES DAILY   carbidopa-levodopa  (SINEMET IR) 25-100 MG tablet Take 1 tablet by mouth 4 (four) times daily.   docusate sodium (COLACE) 100 MG capsule Take 100 mg by mouth 2 (two) times daily.   entacapone (COMTAN) 200 MG tablet Take 1 tablet (200 mg total) by mouth 3 (three) times daily. (Patient taking differently: Take 200 mg by mouth 3 (three) times daily. Patient taking BID)   fluticasone (FLONASE) 50 MCG/ACT nasal spray Place 1-2 sprays into both nostrils daily as needed for allergies or rhinitis.   loratadine (CLARITIN) 10 MG tablet Take 10 mg by mouth daily.   mirabegron ER (MYRBETRIQ) 50 MG TB24 tablet Take 50 mg by mouth daily.   Multiple Vitamin (MULTIVITAMIN) tablet Take 1 tablet by mouth daily.   OVER THE COUNTER MEDICATION Take 4-6 tablets by mouth in the morning, at noon, and at bedtime. RESTORE GOLD 6 tablet in am, 6 tab at lunch and 4 tablet in evening   vitamin B-12 (CYANOCOBALAMIN) 500 MCG tablet Take 500 mcg by mouth daily.   zinc gluconate 50 MG tablet Take 50 mg by mouth daily.   selegiline (ELDEPRYL) 5 MG tablet Take 1 tablet (5 mg total) by mouth 2 (two) times daily with a meal. 9am and noon (Patient not taking: Reported on 04/17/2022)   [DISCONTINUED] lisinopril (ZESTRIL) 10 MG tablet Take 10 mg by mouth daily. (Patient not taking: Reported on 12/05/2021)   [DISCONTINUED] pramipexole (MIRAPEX) 0.125 MG tablet 1 tablet three times per day for a week, then 2 po three times per day for a week (Patient not taking: Reported on 04/17/2022)   [DISCONTINUED] pramipexole (MIRAPEX) 0.5 MG tablet Take 1 tablet (0.5 mg total) by mouth 3 (three) times daily. (Patient not taking: Reported on 04/17/2022)   No facility-administered encounter medications on file as of 04/17/2022.    Objective:   PHYSICAL EXAMINATION:    VITALS:   Vitals:   04/17/22 1404  BP: 110/60  Pulse: 64  SpO2: 97%  Weight: 156 lb 3.2 oz (70.9 kg)  Height: '5\' 6"'$  (1.676 m)      GEN:  The patient appears stated age and is in NAD.  Has some  emotional lability. HEENT:  Normocephalic, atraumatic.  The mucous membranes are moist. The superficial temporal arteries are without ropiness or tenderness.  No drooling at all.  Neurological examination:  Orientation: The patient is alert and oriented x3. Cranial nerves: There is good facial symmetry with facial hypomimia. The speech is fluent and clear. Soft palate rises symmetrically and there is no tongue deviation. Hearing is intact to conversational tone. Sensation: Sensation is intact to light touch throughout Motor: Strength is at least antigravity x4.  Movement examination: Tone: There is normal tone in the UE/LE Abnormal movements: none, even with distraction procedures Coordination:  There is decremation with finger taps bilaterally and hand opening and closing bilaterally.  The left side is slower than the right. Gait and Station: The patient pushes off to arise.  He ambulates slow with flexed posture in the walker.   I have reviewed and interpreted the following labs independently    Chemistry      Component Value Date/Time   NA 135 05/06/2019 0230   K 4.0 05/06/2019 0230   CL 104 05/06/2019 0230   CO2 24 05/06/2019 0230   BUN 22 05/06/2019 0230   CREATININE 0.98 05/06/2019 0230      Component Value Date/Time   CALCIUM 8.7 (L) 05/06/2019 0230       Lab Results  Component Value Date   WBC 9.6 05/06/2019   HGB 9.9 (L) 05/06/2019   HCT 28.9 (L) 05/06/2019   MCV 94.4 05/06/2019   PLT 141 (L) 05/06/2019    No results found for: "TSH"   Total time spent on today's visit was 42 minutes, including both face-to-face time and nonface-to-face time.  Time included that spent on review of records (prior notes available to me/labs/imaging if pertinent), discussing treatment and goals, answering patient's questions and coordinating care.  Cc:  Lawerance Cruel, MD

## 2022-04-12 ENCOUNTER — Ambulatory Visit: Payer: PPO

## 2022-04-12 ENCOUNTER — Ambulatory Visit: Payer: PPO | Admitting: Occupational Therapy

## 2022-04-12 DIAGNOSIS — R2681 Unsteadiness on feet: Secondary | ICD-10-CM

## 2022-04-12 DIAGNOSIS — R29818 Other symptoms and signs involving the nervous system: Secondary | ICD-10-CM | POA: Diagnosis not present

## 2022-04-12 DIAGNOSIS — M6281 Muscle weakness (generalized): Secondary | ICD-10-CM

## 2022-04-12 DIAGNOSIS — R278 Other lack of coordination: Secondary | ICD-10-CM

## 2022-04-12 DIAGNOSIS — R2689 Other abnormalities of gait and mobility: Secondary | ICD-10-CM

## 2022-04-12 DIAGNOSIS — R293 Abnormal posture: Secondary | ICD-10-CM

## 2022-04-12 NOTE — Therapy (Signed)
OUTPATIENT PHYSICAL THERAPY NEURO PROGRESS NOTE   Patient Name: Matthew [name] MRN: [id number] DOB:1951-05-08, 71 y.o., male Today's Date: 03/09/2022   Progress Note Reporting Period 03/09/22 to 04/10/22  See note below for Objective Data and Assessment of Progress/Goals.     PCP: Lawerance Cruel, MD REFERRING PROVIDER: Carles Collet Eustace Quail, DO    PT End of Session - 04/12/22 1414     Visit Number 11    Number of Visits 16    Date for PT Re-Evaluation 05/22/22    Authorization Type [employer]    Progress Note Due on Visit 20    PT Start Time 1400    PT Stop Time 1445    PT Time Calculation (min) 45 min    Equipment Utilized During Treatment Gait belt    Activity Tolerance Patient tolerated treatment well    Behavior During Therapy WFL for tasks assessed/performed                   Past Medical History:  Diagnosis Date   Anxiety    Arthritis    GERD (gastroesophageal reflux disease)    occ remote history   Hypertension    Parkinson disease    Prostate cancer ([place])    Right knee pain    questionable meniscal tear   Seasonal allergies    Past Surgical History:  Procedure Laterality Date   COLONOSCOPY     Leg Laceration Repair  Left    Age 57   PELVIC LYMPH NODE DISSECTION Bilateral 03/22/2018   Procedure: PELVIC LYMPH NODE DISSECTION;  Surgeon: Ceasar Mons, MD;  Location: [facility];  Service: Urology;  Laterality: Bilateral;   PROSTATE BIOPSY     ROBOT ASSISTED LAPAROSCOPIC RADICAL PROSTATECTOMY N/A 03/22/2018   Procedure: XI ROBOTIC ASSISTED LAPAROSCOPIC PROSTATECTOMY;  Surgeon: Ceasar Mons, MD;  Location: [facility];  Service: Urology;  Laterality: N/A;   TOTAL KNEE ARTHROPLASTY Right 05/05/2019   Procedure: RIGHT TOTAL KNEE ARTHROPLASTY;  Surgeon: Frederik Pear, MD;  Location: [facility];  Service: Orthopedics;  Laterality: Right;   Patient Active Problem List   Diagnosis Date Noted   S/P TKR (total knee replacement), right  05/05/2019   Osteoarthritis of right knee 05/02/2019   Prostate cancer ([place]) 03/22/2018   Malignant neoplasm of prostate ([place]) 02/27/2018    ONSET DATE: 2-3 years  REFERRING DIAG: G20.A2 (ICD-10-CM) - Parkinson's disease without dyskinesia, with fluctuating manifestations   THERAPY DIAG:  Other symptoms and signs involving the nervous system  Muscle weakness (generalized)  Unsteadiness on feet  Other abnormalities of gait and mobility  Abnormal posture  Rationale for Evaluation and Treatment Rehabilitation  SUBJECTIVE:  SUBJECTIVE STATEMENT: Feeling pretty good, no falls, no new issues  Pt accompanied by: self  PERTINENT HISTORY: Parkinson's, right TKR,   PAIN:  Are you having pain? No  PRECAUTIONS: None  WEIGHT BEARING RESTRICTIONS No  FALLS: Has patient fallen in last 6 months? Yes. Number of falls 2-3 , unable to perform fall recovery  LIVING ENVIRONMENT: Lives with: lives with their spouse Lives in: House/apartment Stairs: Yes: Internal: 16 steps; can reach both and External: 2-3 steps; on left going up Has following equipment at home: Walker - 4 wheeled  PLOF: Independent with household mobility with device  PATIENT GOALS "get close to normal as possible"  -Be able to walk without fear of falling -Be able to throw horseshoes/darts -Be able to maintain walking program with trekking poles   OBJECTIVE:   TODAY'S TREATMENT: 04/12/22 Activity Comments  Seated PWR moves -up, rock, twist 10x for large amplitude  Motor coordination activities -throwing bean bags and heavy balls to target 7 ft away -med ball slams standing on foam  Wall slides BUE 2x10 For shoulder stretch/ROM and T-spine mobility to reduce flexed posture  Corner balance Standing on foam: feet together:  EO/EC 2x15 sec. EO/EC head turns 5x             TODAY'S TREATMENT: 04/10/22 Activity Comments  Mini Best test  17  10 M walk 2.19 ft/sec  Floor transfer  Verbal cueing for sequencing; performed with supervision      PATIENT EDUCATION: Education details: edu on exam findings and remaining impairments; POC; encouraged improved HEP compliance  Person educated: Patient Education method: Explanation, Demonstration, Tactile cues, Verbal cues, and Handouts Education comprehension: verbalized understanding and returned demonstration   HOME EXERCISE PROGRAM Last updated: 04/04/22 Access Code: [email or web address] URL: [email or web address] Date: 04/04/2022 Prepared by: [facility]  Exercises - Goblet Squat with Kettlebell  - 1 x daily - 7 x weekly - 3 sets - 10 reps - Sumo Squat with Dumbbell  - 1 x daily - 7 x weekly - 3 sets - 10 reps - Step Taps on High Step  - 1 x daily - 5 x weekly - 3 sets - 10 reps - Forward and Backward Step Over with Counter Support  - 1 x daily - 5 x weekly - 3 sets - 10 reps    Below measures were taken at time of initial evaluation unless otherwise specified:   DIAGNOSTIC FINDINGS: none recently  COGNITION: Overall cognitive status: Within functional limits for tasks assessed   SENSATION: WFL  COORDINATION: Grossly intact with some deficits in rapid, alternating and divided movement tasks  EDEMA:    MUSCLE TONE: some rigidity noted in BLE   MUSCLE LENGTH: Knee flexion contractures  DTRs:    POSTURE: forward head  LOWER EXTREMITY ROM:     Active  Right Eval Left Eval  Hip flexion    Hip extension    Hip abduction    Hip adduction    Hip internal rotation    Hip external rotation    Knee flexion    Knee extension -10 -10  Ankle dorsiflexion 5 5  Ankle plantarflexion    Ankle inversion    Ankle eversion     (Blank rows = not tested)  LOWER EXTREMITY MMT:    Grossly 4/5  BLE  BED MOBILITY:  NT  TRANSFERS: Assistive device utilized: Quad cane large base and None  Sit to stand: Complete Independence Stand to sit: Complete  Independence Chair to chair: Modified independence Floor:  NT , pt report need for total assist at this time  RAMP:  NT  CURB:  Level of Assistance: SBA Assistive device utilized: Environmental consultant - 4 wheeled Curb Comments: increased time for processing and negotiation  STAIRS:  Level of Assistance: Modified independence  Stair Negotiation Technique: Alternating Pattern  with Bilateral Rails  Number of Stairs: 5   Height of Stairs: 4-6"  Comments:   GAIT: Gait pattern: shuffling, decreased stride Distance walked:  Assistive device utilized: Environmental consultant - 4 wheeled Level of assistance: SBA Comments:   FUNCTIONAL TESTs:  5 times sit to stand: 11 sec Timed up and go (TUG): 15 sec Mini-BEST test: 14/28 Gait Speed: 16.28 sec = 2.01 ft/sec  PATIENT SURVEYS:     PATIENT EDUCATION: Education details: assessment findings Person educated: Patient Education method: Explanation Education comprehension: verbalized understanding and needs further education   HOME EXERCISE PROGRAM: TBD    GOALS: Goals reviewed with patient? Yes  SHORT TERM GOALS: Target date: 03/31/2022  Patient will be able to perform home program independently for self-management. Baseline: Added to HEP 03/29/2022 Goal status: IN PROGRESS  2.  Improve safety with mobility per time 12 sec TUG to reduce risk for falls Baseline: 15 sec w/ 4WW Goal status:GOAL NOT MET-15.78 sec at best   LONG TERM GOALS: Target date: 05/22/2022  Improve safety and reduce risk for falls with mobility per sore 22/28 Mini-BEST test Baseline: 14/28; 18/28 04/10/22 Goal status: IN PROGRESS 04/10/22  2.  Demonstrate increased gait speed to 3 ft/sec to improve efficiency with community ambulation  Baseline: 2 ft/sec; 2.19 ft/sec 04/10/22 Goal status: IN PROGRESS 04/10/22   3.   Demo supervision level for floor to stand transfers to improve functional mobility and facilitate participation in PD-specific exercise class Baseline: NT, pt reports total assist at this time Goal status: MET 04/10/22  4.  Demonstrate coordination and ability to perform throwing tasks to facilitate participation in leisure activities (horseshoes and darts) Baseline: reports unable to perform Goal status: DEFERRED for OT    ASSESSMENT:  CLINICAL IMPRESSION:  Activities to improve motor control and coordination for throwing items to return to leisure activities such as horseshoes using various weight/size objects to improve force control.  Dynamic balance reveals tendency for right lateral LOB. Continued sessions indicated to progress balance, coordination, and postural re-education to improve mobility and reduce risk for falls  OBJECTIVE IMPAIRMENTS Abnormal gait, decreased activity tolerance, decreased balance, decreased coordination, decreased mobility, difficulty walking, decreased ROM, decreased strength, impaired flexibility, improper body mechanics, and postural dysfunction.   ACTIVITY LIMITATIONS carrying, lifting, bending, squatting, transfers, self feeding, and locomotion level  PARTICIPATION LIMITATIONS: cleaning, interpersonal relationship, shopping, community activity, yard work, and leisure activities  Rives Age, Time since onset of injury/illness/exacerbation, and 1 comorbidity: PD, TKR  are also affecting patient's functional outcome.   REHAB POTENTIAL: Good  CLINICAL DECISION MAKING: Evolving/moderate complexity  EVALUATION COMPLEXITY: Moderate  PLAN: PT FREQUENCY: 1x/week  PT DURATION: 6 weeks  PLANNED INTERVENTIONS: Therapeutic exercises, Therapeutic activity, Neuromuscular re-education, Balance training, Gait training, Patient/Family education, Self Care, Joint mobilization, Stair training, Vestibular training, Canalith repositioning, Orthotic/Fit  training, DME instructions, Aquatic Therapy, Dry Needling, Electrical stimulation, Cryotherapy, Moist heat, Taping, and Manual therapy  PLAN FOR NEXT SESSION: Continue to work on transfers from quadruped to tall kneeling/ half kneeling; continue gait with RW: floor transfer, LARGE amplitude movement patterns, BIG effort to initiate movements   Janene Harvey, PT, DPT 04/12/22 2:14  PM  [facility] at [facility] [place], [place], [place] [address] Phone # [phone] Fax # [phone]

## 2022-04-12 NOTE — Therapy (Signed)
OUTPATIENT OCCUPATIONAL THERAPY  Treatment Note  Patient Name: Matthew [name] MRN: [id number] DOB:12-12-1950, 71 y.o., male Today's Date: 04/12/2022  PCP: Lawerance Cruel, MD REFERRING PROVIDER: Carles Collet Eustace Quail, DO    OT End of Session - 04/12/22 1454     Visit Number 9    Number of Visits 17    Date for OT Re-Evaluation 05/05/22    Authorization Type [employer]    OT Start Time 1450    OT Stop Time 3    OT Time Calculation (min) 40 min                   Past Medical History:  Diagnosis Date   Anxiety    Arthritis    GERD (gastroesophageal reflux disease)    occ remote history   Hypertension    Parkinson disease    Prostate cancer ([place])    Right knee pain    questionable meniscal tear   Seasonal allergies    Past Surgical History:  Procedure Laterality Date   COLONOSCOPY     Leg Laceration Repair  Left    Age 40   PELVIC LYMPH NODE DISSECTION Bilateral 03/22/2018   Procedure: PELVIC LYMPH NODE DISSECTION;  Surgeon: Ceasar Mons, MD;  Location: [facility];  Service: Urology;  Laterality: Bilateral;   PROSTATE BIOPSY     ROBOT ASSISTED LAPAROSCOPIC RADICAL PROSTATECTOMY N/A 03/22/2018   Procedure: XI ROBOTIC ASSISTED LAPAROSCOPIC PROSTATECTOMY;  Surgeon: Ceasar Mons, MD;  Location: [facility];  Service: Urology;  Laterality: N/A;   TOTAL KNEE ARTHROPLASTY Right 05/05/2019   Procedure: RIGHT TOTAL KNEE ARTHROPLASTY;  Surgeon: Frederik Pear, MD;  Location: [facility];  Service: Orthopedics;  Laterality: Right;   Patient Active Problem List   Diagnosis Date Noted   S/P TKR (total knee replacement), right 05/05/2019   Osteoarthritis of right knee 05/02/2019   Prostate cancer ([place]) 03/22/2018   Malignant neoplasm of prostate ([place]) 02/27/2018    ONSET DATE: referral 03/02/22  REFERRING DIAG: G20.A2 (ICD-10-CM) - Parkinson's disease without dyskinesia, with fluctuating manifestations   THERAPY DIAG:  Other symptoms and signs  involving the nervous system  Muscle weakness (generalized)  Unsteadiness on feet  Other lack of coordination  Other abnormalities of gait and mobility  Rationale for Evaluation and Treatment Rehabilitation  SUBJECTIVE:   SUBJECTIVE STATEMENT: Pt reports that he has not had any near falling asleep episodes while up and moving for awhile. Pt accompanied by: self and spouse dropped pt off  PERTINENT HISTORY: anxiety, arthritis, GERD, HTN, R TKR  PRECAUTIONS: Fall  WEIGHT BEARING RESTRICTIONS No  PAIN:  Are you having pain? No  FALLS: Has patient fallen in last 6 months? Yes. Number of falls 2, fell last night  PLOF: Requires assistive device for independence and Needs assistance with ADLs  PATIENT GOALS to get as close to normal as possible  OBJECTIVE:   HAND DOMINANCE: Right   FUNCTIONAL OUTCOME MEASURES: Physical performance test: PPT #2 (self-feeding):17.66 sec *     PPT #4 (jacket on/off): Attempted but pt unable to complete due to increased lightheadedness in standing and difficulty with previously educated "cape technique"     3 button/unbutton: 2:56.5  COORDINATION: From screen on 03/02/22 9 Hole Peg test: Right: 49.13 sec; Left: 44.53 sec  MUSCLE TONE: RUE: Mild and LUE: Mild  COGNITION: slower processing overall  VISION: Baseline vision: Bifocals and Wears glasses all the time   TODAY'S TREATMENT:   04/12/22 Large amplitude: forward reaching with  elbow and wrist extension with finger opening x10 and then arm opening/horizontal abduction x10 in preparation for jacket. ADL: donning/doffing jacket with "cape technique".  Pt with significant difficulty with donning and doffing jacket requiring significant increase in time.  Pt demonstrate min increased ease with donning R arm first, however still requiring significant increase in time. Utilized Geologist, engineering for increased feedback of mobility, drawing attention to shoulder when attempting to doff jacket over  shoulder.  OT providing demonstration, verbal cues, and intermittent tactile cues to facilitate increased technique, with minimal carryover. Large shoulder roll with abduction to simulate doffing jacket.  Completed with BUE and then R/L UE separately to simulate movement as needed for doffing jacket with focus on large amplitude opening of shoulders and chest.  Again utilized mirror for increased feedback of movements.    04/10/22 Buttons: Pt completing finger flicks and thumb opposition flicks prior to engaging in buttons.  Pt then completing with mod-max difficulty due to decreased strategy and attempting to pull button instead of pushing through hole.  Pt required 4:24 to complete buttoning/unbuttoning of 3 buttons.  OT educating on alternative method with pt completing with blocked practice, however still with inconsistent improvements.   AE: Educated on use of AE/button hook OT providing verbal and demonstration cues.  After practice, pt able to complete 3 button/unbutton in 1:12 with use of button hook.  OT provided pt with handout for purchase if desired.      04/06/22 Hip stretch: engaged in figure 4 stretch with hold ~30 seconds bilaterally.  Pt asking about hip stretch that he completes during PD dance class.  OT providing demonstration, verbal and tactile cues for improved positioning and incorporating trunk extension to further facilitate hip flexion to compensate for decreased hip extension.  Exercises completed to aid in LB dressing.   Large amplitude: engaged in 2 sets of 10 forward reach with large amplitude wrist extension and hand opening.  OT providing min cues for technique.  Engaged in PNF D2 reaching with focus on large amplitude and full opening of hand during upward and downward reach.  OT providing demonstration and verbal cues.  Pt with improved reach and amplitude when visually attending to task. Self-feeding: Educated on AE to aid in self-feeding such as scoop plate, use of  knife to scoop towards, and showed rocker knife.  Attempted scooping food with use of knife as barrier vs scoop plate edge. Pt continues to demonstrate forward flexion to decrease space between plate and mouth.  OT encouraging increased upright posture to not reinforce small repetitive movements.  Utilized variety of mediums to attempt to simulate food textures with pt still spilling but reporting understanding of modifications and suggestions with plans to assess scooping technique at home. Reaching: pt reaching ~120-125* with RUE, utilizing LUE as gross assist to stabilize underside of larger items when removing items from cabinet with BUE. Pt continues to demonstrate decreased functional shoulder ROM.    PATIENT EDUCATION: Education details: educated on large amplitude movements/finger flicks before [place] tasks, and edu on AE for increased ease with buttons Person educated: Patient Education method: Explanation Education comprehension: verbalized understanding   HOME EXERCISE PROGRAM: Bag exercises - see pt instructions    GOALS: Goals reviewed with patient? Yes  SHORT TERM GOALS: Target date: 04/07/22  Pt will be Independent with PD specific HEP Baseline: Goal status: MET - reports completing 2-3x/week  2.  Pt will verbalize understanding of adapted strategies and DME/AE PRN (reacher, rocker knife, scoop plate/plate guard, etc)  to maximize safety and independence with ADLs/IADLs. Baseline:  Goal status: IN PROGRESS - ongoing 04/06/22  3.  Pt will demonstrate improved shoulder flexion bilaterally to retreive a lightweight object from overhead shelf with BUE at >/= 130 shoulder flexion. Baseline:  Goal status: NOT MET - Right: 120* and Left: 124*  LONG TERM GOALS: Target date: 05/05/22  Pt will demonstrate increased functional ROM and use of BUE to aid in clothing management and hygiene with toileting tasks. Baseline:  Goal status: IN PROGRESS  2.  Pt will demonstrate improved  fine motor coordination for ADLs as evidenced by decreasing 9 hole peg test score for RUE by 3 secs  Baseline: 9 Hole Peg test: Right: 49.13 sec; Left: 44.53 sec Goal status: IN PROGRESS  3.  Pt will demonstrate increased ease with dressing as evidenced by decreasing PPT#4(don/ doff jacket) by 30 seconds  Baseline: time TBA at next session Goal status: IN PROGRESS  4.  Pt will demonstrate improved ease with feeding as evidenced by decreasing PPT#2 (self feeding) by 3 secs  Baseline: 17.66 sec Goal status: IN PROGRESS  5. Pt will demonstrate and/or report improved independence with LB dressing with use of AE/DME as needed.  Baseline:  Goal status: IN PROGRESS   ASSESSMENT:  CLINICAL IMPRESSION: Treatment session with focus on large amplitude movements prior to and post massed practice of donning/doffing jacket. Pt continues to demonstrate significant slowing in mobility and quality of movement impacting donning/doffing jacket, forward shoulders largely attributing to difficulty in additional to small movements from PD.    PERFORMANCE DEFICITS in functional skills including ADLs, IADLs, coordination, tone, ROM, strength, flexibility, FMC, GMC, balance, body mechanics, endurance, decreased knowledge of use of DME, and UE functional use and psychosocial skills including environmental adaptation and routines and behaviors.   IMPAIRMENTS are limiting patient from ADLs and IADLs.   COMORBIDITIES may have co-morbidities  that affects occupational performance. Patient will benefit from skilled OT to address above impairments and improve overall function.  MODIFICATION OR ASSISTANCE TO COMPLETE EVALUATION: Min-Moderate modification of tasks or assist with assess necessary to complete an evaluation.  OT OCCUPATIONAL PROFILE AND HISTORY: Detailed assessment: Review of records and additional review of physical, cognitive, psychosocial history related to current functional performance.  CLINICAL  DECISION MAKING: Moderate - several treatment options, min-mod task modification necessary  REHAB POTENTIAL: Good  EVALUATION COMPLEXITY: Moderate    PLAN: OT FREQUENCY: 2x/week  OT DURATION: 8 weeks  PLANNED INTERVENTIONS: self care/ADL training, therapeutic exercise, therapeutic activity, neuromuscular re-education, manual therapy, passive range of motion, balance training, functional mobility training, ultrasound, compression bandaging, moist heat, cryotherapy, patient/family education, cognitive remediation/compensation, energy conservation, and DME and/or AE instructions  RECOMMENDED OTHER SERVICES: N/A  CONSULTED AND AGREED WITH PLAN OF CARE: Patient  PLAN FOR NEXT SESSION: Initiate/review large amplitude HEP, Review and add to bag exercises for dressing tasks - complete in front of mirror for feedback, introduce AE/DME for increased safety/independence with bathing/dressing tasks.  Continue to assess AE and self-feeding.    Simonne Come, OTR/L 04/12/2022, 2:55 PM

## 2022-04-17 ENCOUNTER — Encounter: Payer: PPO | Admitting: Occupational Therapy

## 2022-04-17 ENCOUNTER — Ambulatory Visit: Payer: PPO

## 2022-04-17 ENCOUNTER — Ambulatory Visit: Payer: PPO | Admitting: Neurology

## 2022-04-17 ENCOUNTER — Encounter: Payer: Self-pay | Admitting: Neurology

## 2022-04-17 VITALS — BP 110/60 | HR 64 | Ht 66.0 in | Wt 156.2 lb

## 2022-04-17 DIAGNOSIS — F482 Pseudobulbar affect: Secondary | ICD-10-CM

## 2022-04-17 DIAGNOSIS — R2689 Other abnormalities of gait and mobility: Secondary | ICD-10-CM

## 2022-04-17 DIAGNOSIS — G4719 Other hypersomnia: Secondary | ICD-10-CM | POA: Diagnosis not present

## 2022-04-17 DIAGNOSIS — G20A1 Parkinson's disease without dyskinesia, without mention of fluctuations: Secondary | ICD-10-CM | POA: Diagnosis not present

## 2022-04-17 DIAGNOSIS — R293 Abnormal posture: Secondary | ICD-10-CM

## 2022-04-17 DIAGNOSIS — G20A2 Parkinson's disease without dyskinesia, with fluctuations: Secondary | ICD-10-CM

## 2022-04-17 DIAGNOSIS — R2681 Unsteadiness on feet: Secondary | ICD-10-CM

## 2022-04-17 DIAGNOSIS — R29818 Other symptoms and signs involving the nervous system: Secondary | ICD-10-CM | POA: Diagnosis not present

## 2022-04-17 DIAGNOSIS — M6281 Muscle weakness (generalized): Secondary | ICD-10-CM

## 2022-04-17 MED ORDER — INBRIJA 42 MG IN CAPS: ORAL_CAPSULE | RESPIRATORY_TRACT | 0 refills | Status: DC

## 2022-04-17 NOTE — Therapy (Signed)
OUTPATIENT PHYSICAL THERAPY NEURO TREATMENT   Patient Name: Matthew [name] MRN: [id number] DOB:1950-09-05, 71 y.o., male Today's Date: 03/09/2022   PCP: Lawerance Cruel, MD REFERRING PROVIDER: Carles Collet Eustace Quail, DO    PT End of Session - 04/17/22 1539     Visit Number 12    Number of Visits 16    Date for PT Re-Evaluation 05/22/22    Authorization Type [employer]    Progress Note Due on Visit 20    PT Start Time 1530    PT Stop Time 1615    PT Time Calculation (min) 45 min    Equipment Utilized During Treatment Gait belt    Activity Tolerance Patient tolerated treatment well    Behavior During Therapy WFL for tasks assessed/performed                   Past Medical History:  Diagnosis Date   Anxiety    Arthritis    GERD (gastroesophageal reflux disease)    occ remote history   Hypertension    Parkinson disease    Prostate cancer ([place])    Right knee pain    questionable meniscal tear   Seasonal allergies    Past Surgical History:  Procedure Laterality Date   COLONOSCOPY     Leg Laceration Repair  Left    Age 75   PELVIC LYMPH NODE DISSECTION Bilateral 03/22/2018   Procedure: PELVIC LYMPH NODE DISSECTION;  Surgeon: Ceasar Mons, MD;  Location: [facility];  Service: Urology;  Laterality: Bilateral;   PROSTATE BIOPSY     ROBOT ASSISTED LAPAROSCOPIC RADICAL PROSTATECTOMY N/A 03/22/2018   Procedure: XI ROBOTIC ASSISTED LAPAROSCOPIC PROSTATECTOMY;  Surgeon: Ceasar Mons, MD;  Location: [facility];  Service: Urology;  Laterality: N/A;   TOTAL KNEE ARTHROPLASTY Right 05/05/2019   Procedure: RIGHT TOTAL KNEE ARTHROPLASTY;  Surgeon: Frederik Pear, MD;  Location: [facility];  Service: Orthopedics;  Laterality: Right;   Patient Active Problem List   Diagnosis Date Noted   S/P TKR (total knee replacement), right 05/05/2019   Osteoarthritis of right knee 05/02/2019   Prostate cancer ([place]) 03/22/2018   Malignant neoplasm of prostate ([place])  02/27/2018    ONSET DATE: 2-3 years  REFERRING DIAG: G20.A2 (ICD-10-CM) - Parkinson's disease without dyskinesia, with fluctuating manifestations   THERAPY DIAG:  Other symptoms and signs involving the nervous system  Muscle weakness (generalized)  Unsteadiness on feet  Other abnormalities of gait and mobility  Abnormal posture  Parkinson's disease without dyskinesia, with fluctuating manifestations  Rationale for Evaluation and Treatment Rehabilitation  SUBJECTIVE:  SUBJECTIVE STATEMENT:  Had a visit with PD-neurologist. Working on medication regimen.   Pt accompanied by: self  PERTINENT HISTORY: Parkinson's, right TKR,   PAIN:  Are you having pain? No  PRECAUTIONS: None  WEIGHT BEARING RESTRICTIONS No  FALLS: Has patient fallen in last 6 months? Yes. Number of falls 2-3 , unable to perform fall recovery  LIVING ENVIRONMENT: Lives with: lives with their spouse Lives in: House/apartment Stairs: Yes: Internal: 16 steps; can reach both and External: 2-3 steps; on left going up Has following equipment at home: Walker - 4 wheeled  PLOF: Independent with household mobility with device  PATIENT GOALS "get close to normal as possible"  -Be able to walk without fear of falling -Be able to throw horseshoes/darts -Be able to maintain walking program with trekking poles   OBJECTIVE:   TODAY'S TREATMENT: 04/17/22 Activity Comments  NU-step resistance intervals x 10 min to improve activity tolerance and alternating movements Heavy:light 1:1 fast:slow pace accordingly  - Goblet Squat with Kettlebell 5x5 15# KB  - Sumo Squat with Dumbbell  5x5 15# KB  - Step Taps on High Step  2x10 8" box, single UE support  - Forward and Backward Step Over and lateral step over with Counter Support     Standing on foam Medball arc, overhead press 1x10 4# ball     TODAY'S TREATMENT: 04/12/22 Activity Comments  Seated PWR moves -up, rock, twist 10x for large amplitude  Motor coordination activities -throwing bean bags and heavy balls to target 7 ft away -med ball slams standing on foam  Wall slides BUE 2x10 For shoulder stretch/ROM and T-spine mobility to reduce flexed posture  Corner balance Standing on foam: feet together: EO/EC 2x15 sec. EO/EC head turns 5x             TODAY'S TREATMENT: 04/10/22 Activity Comments  Mini Best test  83  10 M walk 2.19 ft/sec  Floor transfer  Verbal cueing for sequencing; performed with supervision      PATIENT EDUCATION: Education details: edu on exam findings and remaining impairments; POC; encouraged improved HEP compliance  Person educated: Patient Education method: Explanation, Demonstration, Tactile cues, Verbal cues, and Handouts Education comprehension: verbalized understanding and returned demonstration   HOME EXERCISE PROGRAM Last updated: 04/04/22 Access Code: [email or web address] URL: [email or web address] Date: 04/04/2022 Prepared by: Harvey with Kettlebell  - 1 x daily - 7 x weekly - 3 sets - 10 reps - Sumo Squat with Dumbbell  - 1 x daily - 7 x weekly - 3 sets - 10 reps - Step Taps on High Step  - 1 x daily - 5 x weekly - 3 sets - 10 reps - Forward and Backward Step Over with Counter Support  - 1 x daily - 5 x weekly - 3 sets - 10 reps    Below measures were taken at time of initial evaluation unless otherwise specified:   DIAGNOSTIC FINDINGS: none recently  COGNITION: Overall cognitive status: Within functional limits for tasks assessed   SENSATION: WFL  COORDINATION: Grossly intact with some deficits in rapid, alternating and divided movement tasks  EDEMA:    MUSCLE TONE: some rigidity noted in BLE   MUSCLE LENGTH: Knee flexion  contractures  DTRs:    POSTURE: forward head  LOWER EXTREMITY ROM:     Active  Right Eval Left Eval  Hip flexion    Hip extension    Hip abduction  Hip adduction    Hip internal rotation    Hip external rotation    Knee flexion    Knee extension -10 -10  Ankle dorsiflexion 5 5  Ankle plantarflexion    Ankle inversion    Ankle eversion     (Blank rows = not tested)  LOWER EXTREMITY MMT:    Grossly 4/5 BLE  BED MOBILITY:  NT  TRANSFERS: Assistive device utilized: Quad cane large base and None  Sit to stand: Complete Independence Stand to sit: Complete Independence Chair to chair: Modified independence Floor:  NT , pt report need for total assist at this time  RAMP:  NT  CURB:  Level of Assistance: SBA Assistive device utilized: Environmental consultant - 4 wheeled Curb Comments: increased time for processing and negotiation  STAIRS:  Level of Assistance: Modified Artist Technique: Alternating Pattern  with Bilateral Rails  Number of Stairs: 5   Height of Stairs: 4-6"  Comments:   GAIT: Gait pattern: shuffling, decreased stride Distance walked:  Assistive device utilized: Environmental consultant - 4 wheeled Level of assistance: SBA Comments:   FUNCTIONAL TESTs:  5 times sit to stand: 11 sec Timed up and go (TUG): 15 sec Mini-BEST test: 14/28 Gait Speed: 16.28 sec = 2.01 ft/sec  PATIENT SURVEYS:     PATIENT EDUCATION: Education details: assessment findings Person educated: Patient Education method: Explanation Education comprehension: verbalized understanding and needs further education   HOME EXERCISE PROGRAM: TBD    GOALS: Goals reviewed with patient? Yes  SHORT TERM GOALS: Target date: 03/31/2022  Patient will be able to perform home program independently for self-management. Baseline: Added to HEP 03/29/2022 Goal status: IN PROGRESS  2.  Improve safety with mobility per time 12 sec TUG to reduce risk for falls Baseline: 15 sec w/  4WW Goal status:GOAL NOT MET-15.78 sec at best   LONG TERM GOALS: Target date: 05/22/2022  Improve safety and reduce risk for falls with mobility per sore 22/28 Mini-BEST test Baseline: 14/28; 18/28 04/10/22 Goal status: IN PROGRESS 04/10/22  2.  Demonstrate increased gait speed to 3 ft/sec to improve efficiency with community ambulation  Baseline: 2 ft/sec; 2.19 ft/sec 04/10/22 Goal status: IN PROGRESS 04/10/22   3.  Demo supervision level for floor to stand transfers to improve functional mobility and facilitate participation in PD-specific exercise class Baseline: NT, pt reports total assist at this time Goal status: MET 04/10/22  4.  Demonstrate coordination and ability to perform throwing tasks to facilitate participation in leisure activities (horseshoes and darts) Baseline: reports unable to perform Goal status: DEFERRED for OT    ASSESSMENT:  CLINICAL IMPRESSION: Cues for form in sumo squat and engagement of glutes to promote hip and trunk extension. Continued with HEP review and demo improved coordination and fluidity with backwards stepping with improved step height evident by decrease in tripping over bolster and decrease in cues for larger step length. Continue sto require cues in large amplitude movement and engagment of upright posture. Continued sessions indicated to progress motor control/coordination to enhance dynamic balance and reduce risk for falls  OBJECTIVE IMPAIRMENTS Abnormal gait, decreased activity tolerance, decreased balance, decreased coordination, decreased mobility, difficulty walking, decreased ROM, decreased strength, impaired flexibility, improper body mechanics, and postural dysfunction.   ACTIVITY LIMITATIONS carrying, lifting, bending, squatting, transfers, self feeding, and locomotion level  PARTICIPATION LIMITATIONS: cleaning, interpersonal relationship, shopping, community activity, yard work, and leisure activities  [place] Age,  Time since onset of injury/illness/exacerbation, and 1 comorbidity: PD, TKR  are  also affecting patient's functional outcome.   REHAB POTENTIAL: Good  CLINICAL DECISION MAKING: Evolving/moderate complexity  EVALUATION COMPLEXITY: Moderate  PLAN: PT FREQUENCY: 1x/week  PT DURATION: 6 weeks  PLANNED INTERVENTIONS: Therapeutic exercises, Therapeutic activity, Neuromuscular re-education, Balance training, Gait training, Patient/Family education, Self Care, Joint mobilization, Stair training, Vestibular training, Canalith repositioning, Orthotic/Fit training, DME instructions, Aquatic Therapy, Dry Needling, Electrical stimulation, Cryotherapy, Moist heat, Taping, and Manual therapy  PLAN FOR NEXT SESSION: Continue to work on transfers from quadruped to tall kneeling/ half kneeling; continue gait with RW: floor transfer, LARGE amplitude movement patterns, BIG effort to initiate movements  4:16 PM, 04/17/22 M. Sherlyn Lees, PT, DPT Physical Therapist- [place] Office Number: [phone]    [facility] [address], [place], [place] [address] Phone # [phone] Fax # [phone]

## 2022-04-17 NOTE — Patient Instructions (Signed)
STOP entacapone  We are going to start inbrija.  Remember that TWO capsules is ONE dosage (never inhale just one capsule).  You can inhale the capsules as needed up to 5 times per day, separated by 2 hour intervals.  Many patients use this right when the wake up to help with first morning "on" and then as needed during the day.  You should take a sip of water prior to using the inhaler to avoid side effects.  It may generate some cough right when you use it and that is normal.  There are nurse educators available to help you with this device.  You can call [phone] and they will set you up with a nurse educator to assist you for free of charge.  They are available 8am-8pm Monday-Friday.  I would like to see you OFF of the restore gold.

## 2022-04-19 ENCOUNTER — Ambulatory Visit: Payer: PPO | Admitting: Occupational Therapy

## 2022-04-19 ENCOUNTER — Ambulatory Visit: Payer: PPO

## 2022-04-19 DIAGNOSIS — R293 Abnormal posture: Secondary | ICD-10-CM

## 2022-04-19 DIAGNOSIS — R29818 Other symptoms and signs involving the nervous system: Secondary | ICD-10-CM

## 2022-04-19 DIAGNOSIS — R29898 Other symptoms and signs involving the musculoskeletal system: Secondary | ICD-10-CM

## 2022-04-19 DIAGNOSIS — R2689 Other abnormalities of gait and mobility: Secondary | ICD-10-CM

## 2022-04-19 DIAGNOSIS — M6281 Muscle weakness (generalized): Secondary | ICD-10-CM

## 2022-04-19 DIAGNOSIS — R278 Other lack of coordination: Secondary | ICD-10-CM

## 2022-04-19 DIAGNOSIS — R2681 Unsteadiness on feet: Secondary | ICD-10-CM

## 2022-04-19 NOTE — Therapy (Signed)
OUTPATIENT OCCUPATIONAL THERAPY  Treatment Note  Patient Name: Matthew [name] MRN: [id number] DOB:1950-10-23, 71 y.o., male Today's Date: 04/19/2022  PCP: Lawerance Cruel, MD REFERRING PROVIDER: Carles Collet Eustace Quail, DO    OT End of Session - 04/19/22 1451     Visit Number 10    Number of Visits 17    Date for OT Re-Evaluation 05/05/22    Authorization Type [employer]    OT Start Time 1446    OT Stop Time 42    OT Time Calculation (min) 42 min                    Past Medical History:  Diagnosis Date   Anxiety    Arthritis    GERD (gastroesophageal reflux disease)    occ remote history   Hypertension    Parkinson disease    Prostate cancer ([place])    Right knee pain    questionable meniscal tear   Seasonal allergies    Past Surgical History:  Procedure Laterality Date   COLONOSCOPY     Leg Laceration Repair  Left    Age 74   PELVIC LYMPH NODE DISSECTION Bilateral 03/22/2018   Procedure: PELVIC LYMPH NODE DISSECTION;  Surgeon: Ceasar Mons, MD;  Location: [facility];  Service: Urology;  Laterality: Bilateral;   PROSTATE BIOPSY     ROBOT ASSISTED LAPAROSCOPIC RADICAL PROSTATECTOMY N/A 03/22/2018   Procedure: XI ROBOTIC ASSISTED LAPAROSCOPIC PROSTATECTOMY;  Surgeon: Ceasar Mons, MD;  Location: [facility];  Service: Urology;  Laterality: N/A;   TOTAL KNEE ARTHROPLASTY Right 05/05/2019   Procedure: RIGHT TOTAL KNEE ARTHROPLASTY;  Surgeon: Frederik Pear, MD;  Location: [facility];  Service: Orthopedics;  Laterality: Right;   Patient Active Problem List   Diagnosis Date Noted   S/P TKR (total knee replacement), right 05/05/2019   Osteoarthritis of right knee 05/02/2019   Prostate cancer ([place]) 03/22/2018   Malignant neoplasm of prostate ([place]) 02/27/2018    ONSET DATE: referral 03/02/22  REFERRING DIAG: G20.A2 (ICD-10-CM) - Parkinson's disease without dyskinesia, with fluctuating manifestations   THERAPY DIAG:  Other symptoms and signs  involving the nervous system  Muscle weakness (generalized)  Unsteadiness on feet  Abnormal posture  Other lack of coordination  Rationale for Evaluation and Treatment Rehabilitation  SUBJECTIVE:   SUBJECTIVE STATEMENT: Pt reports that he was able to see Dr. Carles Collet on Monday.  Pt reports that she changed some of his medications and he hopes that this will "rectify some of my problems." Pt accompanied by: self and spouse dropped pt off  PERTINENT HISTORY: anxiety, arthritis, GERD, HTN, R TKR  PRECAUTIONS: Fall  WEIGHT BEARING RESTRICTIONS No  PAIN:  Are you having pain? No  FALLS: Has patient fallen in last 6 months? Yes. Number of falls 2  PLOF: Requires assistive device for independence and Needs assistance with ADLs  PATIENT GOALS to get as close to normal as possible  OBJECTIVE:   HAND DOMINANCE: Right   FUNCTIONAL OUTCOME MEASURES: Physical performance test: PPT #2 (self-feeding):17.66 sec *     PPT #4 (jacket on/off): Attempted but pt unable to complete due to increased lightheadedness in standing and difficulty with previously educated "cape technique"     3 button/unbutton: 2:56.5  COORDINATION: From screen on 03/02/22 9 Hole Peg test: Right: 49.13 sec; Left: 44.53 sec  MUSCLE TONE: RUE: Mild and LUE: Mild  COGNITION: slower processing overall  VISION: Baseline vision: Bifocals and Wears glasses all the time   TODAY'S  TREATMENT:   04/19/22 9 hole peg test: R: 1:16.81 seconds.  Completed initially for [facility] then completed while timing.  Pt demonstrating increased difficulty during 2nd attempt compared to first. Adaptive equipment: reviewed button hook.  Pt reports that he now tries to avoid shirts with buttons, but may consider purchasing button hook as it helped significantly during previous session.  Reviewed reacher for LB dressing.  Pt demonstrating improved forward flexion and leg lifting with simulated LB dressing, therefore do not recommend use of  reacher at this time. Simulated LB dressing: Completed bag exercises 5x2 bilaterally with focus on coordinating lifting leg and forward reach simultaneously to simulate threading pants.  Pt completed with gait belt to simulate waist band to allow for completion with BLE.  Pt with increased difficulty L > R as well as first time compared to subsequent repetitions.  Jacket: ~ 5 mins to doff/don jacket.  Pt able to doff in ~1 minute, but continues to require significantly increased time when donning due to freezing when attempting to swing jacket around shoulders in "cape technique".      04/12/22 Large amplitude: forward reaching with elbow and wrist extension with finger opening x10 and then arm opening/horizontal abduction x10 in preparation for jacket. ADL: donning/doffing jacket with "cape technique".  Pt with significant difficulty with donning and doffing jacket requiring significant increase in time.  Pt demonstrate min increased ease with donning R arm first, however still requiring significant increase in time. Utilized Geologist, engineering for increased feedback of mobility, drawing attention to shoulder when attempting to doff jacket over shoulder.  OT providing demonstration, verbal cues, and intermittent tactile cues to facilitate increased technique, with minimal carryover. Large shoulder roll with abduction to simulate doffing jacket.  Completed with BUE and then R/L UE separately to simulate movement as needed for doffing jacket with focus on large amplitude opening of shoulders and chest.  Again utilized mirror for increased feedback of movements.    04/10/22 Buttons: Pt completing finger flicks and thumb opposition flicks prior to engaging in buttons.  Pt then completing with mod-max difficulty due to decreased strategy and attempting to pull button instead of pushing through hole.  Pt required 4:24 to complete buttoning/unbuttoning of 3 buttons.  OT educating on alternative method with pt completing  with blocked practice, however still with inconsistent improvements.   AE: Educated on use of AE/button hook OT providing verbal and demonstration cues.  After practice, pt able to complete 3 button/unbutton in 1:12 with use of button hook.  OT provided pt with handout for purchase if desired.     PATIENT EDUCATION: Education details: educated on large amplitude movements/finger flicks before [place] tasks, and edu on AE for increased ease with buttons Person educated: Patient Education method: Explanation Education comprehension: verbalized understanding   HOME EXERCISE PROGRAM: Bag exercises - see pt instructions    GOALS: Goals reviewed with patient? Yes  SHORT TERM GOALS: Target date: 04/07/22  Pt will be Independent with PD specific HEP Baseline: Goal status: MET - reports completing 2-3x/week  2.  Pt will verbalize understanding of adapted strategies and DME/AE PRN (reacher, rocker knife, scoop plate/plate guard, etc) to maximize safety and independence with ADLs/IADLs. Baseline:  Goal status: IN PROGRESS - ongoing 04/06/22  3.  Pt will demonstrate improved shoulder flexion bilaterally to retreive a lightweight object from overhead shelf with BUE at >/= 130 shoulder flexion. Baseline:  Goal status: NOT MET - Right: 120* and Left: 124*  LONG TERM GOALS: Target date: 05/05/22  Pt will demonstrate increased functional ROM and use of BUE to aid in clothing management and hygiene with toileting tasks. Baseline:  Goal status: IN PROGRESS  2.  Pt will demonstrate improved fine motor coordination for ADLs as evidenced by decreasing 9 hole peg test score for RUE by 3 secs  Baseline: 9 Hole Peg test: Right: 49.13 sec; Left: 44.53 sec Goal status: IN PROGRESS  3.  Pt will demonstrate increased ease with dressing as evidenced by decreasing PPT#4(don/ doff jacket) by 30 seconds  Baseline: time TBA at next session Goal status: IN PROGRESS  4.  Pt will demonstrate improved ease with  feeding as evidenced by decreasing PPT#2 (self feeding) by 3 secs  Baseline: 17.66 sec Goal status: IN PROGRESS  5. Pt will demonstrate and/or report improved independence with LB dressing with use of AE/DME as needed.  Baseline:  Goal status: IN PROGRESS   ASSESSMENT:  CLINICAL IMPRESSION: Treatment session with focus on self-care tasks with LB dressing and donning/doffing jacket. Reviewed AE options to aid in fastening buttons and with LB dressing.  Pt demonstrating improvements with motor control with massed practice with bag exercise for LB dressing.  Pt continues to demonstrate significant slowing and freezing in movements impacting donning/doffing jacket, forward shoulders largely attributing to difficulty in additional to small movements from PD.    PERFORMANCE DEFICITS in functional skills including ADLs, IADLs, coordination, tone, ROM, strength, flexibility, FMC, GMC, balance, body mechanics, endurance, decreased knowledge of use of DME, and UE functional use and psychosocial skills including environmental adaptation and routines and behaviors.   IMPAIRMENTS are limiting patient from ADLs and IADLs.   COMORBIDITIES may have co-morbidities  that affects occupational performance. Patient will benefit from skilled OT to address above impairments and improve overall function.  MODIFICATION OR ASSISTANCE TO COMPLETE EVALUATION: Min-Moderate modification of tasks or assist with assess necessary to complete an evaluation.  OT OCCUPATIONAL PROFILE AND HISTORY: Detailed assessment: Review of records and additional review of physical, cognitive, psychosocial history related to current functional performance.  CLINICAL DECISION MAKING: Moderate - several treatment options, min-mod task modification necessary  REHAB POTENTIAL: Good  EVALUATION COMPLEXITY: Moderate    PLAN: OT FREQUENCY: 2x/week  OT DURATION: 8 weeks  PLANNED INTERVENTIONS: self care/ADL training, therapeutic  exercise, therapeutic activity, neuromuscular re-education, manual therapy, passive range of motion, balance training, functional mobility training, ultrasound, compression bandaging, moist heat, cryotherapy, patient/family education, cognitive remediation/compensation, energy conservation, and DME and/or AE instructions  RECOMMENDED OTHER SERVICES: N/A  CONSULTED AND AGREED WITH PLAN OF CARE: Patient  PLAN FOR NEXT SESSION: Review and add to bag exercises for dressing tasks - complete in front of mirror for feedback, introduce AE/DME for increased safety/independence with bathing/dressing tasks.  Continue to assess AE and self-feeding.    Simonne Come, OTR/L 04/19/2022, 2:51 PM

## 2022-04-19 NOTE — Therapy (Signed)
OUTPATIENT PHYSICAL THERAPY NEURO TREATMENT   Patient Name: Authur Cubit MRN: [id number] DOB:Aug 02, 1950, 71 y.o., male Today's Date: 03/09/2022   PCP: Lawerance Cruel, MD REFERRING PROVIDER: Carles Collet Eustace Quail, DO    PT End of Session - 04/19/22 1355     Visit Number 13    Number of Visits 16    Date for PT Re-Evaluation 05/22/22    Authorization Type [employer]    Progress Note Due on Visit 20    PT Start Time 1400    PT Stop Time 1445    PT Time Calculation (min) 45 min    Equipment Utilized During Treatment Gait belt    Activity Tolerance Patient tolerated treatment well    Behavior During Therapy WFL for tasks assessed/performed                   Past Medical History:  Diagnosis Date   Anxiety    Arthritis    GERD (gastroesophageal reflux disease)    occ remote history   Hypertension    Parkinson disease    Prostate cancer ([place])    Right knee pain    questionable meniscal tear   Seasonal allergies    Past Surgical History:  Procedure Laterality Date   COLONOSCOPY     Leg Laceration Repair  Left    Age 62   PELVIC LYMPH NODE DISSECTION Bilateral 03/22/2018   Procedure: PELVIC LYMPH NODE DISSECTION;  Surgeon: Ceasar Mons, MD;  [facility];  Service: Urology;  Laterality: Bilateral;   PROSTATE BIOPSY     ROBOT ASSISTED LAPAROSCOPIC RADICAL PROSTATECTOMY N/A 03/22/2018   Procedure: XI ROBOTIC ASSISTED LAPAROSCOPIC PROSTATECTOMY;  Surgeon: Ceasar Mons, MD;  [facility];  Service: Urology;  Laterality: N/A;   TOTAL KNEE ARTHROPLASTY Right 05/05/2019   Procedure: RIGHT TOTAL KNEE ARTHROPLASTY;  Surgeon: Frederik Pear, MD;  Location: [facility];  Service: Orthopedics;  Laterality: Right;   Patient Active Problem List   Diagnosis Date Noted   S/P TKR (total knee replacement), right 05/05/2019   Osteoarthritis of right knee 05/02/2019   Prostate cancer ([place]) 03/22/2018   Malignant neoplasm of prostate ([place])  02/27/2018    ONSET DATE: 2-3 years  REFERRING DIAG: G20.A2 (ICD-10-CM) - Parkinson's disease without dyskinesia, with fluctuating manifestations   THERAPY DIAG:  Other symptoms and signs involving the nervous system  Muscle weakness (generalized)  Unsteadiness on feet  Other abnormalities of gait and mobility  Abnormal posture  Other symptoms and signs involving the musculoskeletal system  Rationale for Evaluation and Treatment Rehabilitation  SUBJECTIVE:  SUBJECTIVE STATEMENT:  Not much going on today, just getting ready to come here  Pt accompanied by: self  PERTINENT HISTORY: Parkinson's, right TKR,   PAIN:  Are you having pain? No  PRECAUTIONS: None  WEIGHT BEARING RESTRICTIONS No  FALLS: Has patient fallen in last 6 months? Yes. Number of falls 2-3 , unable to perform fall recovery  LIVING ENVIRONMENT: Lives with: lives with their spouse Lives in: House/apartment Stairs: Yes: Internal: 16 steps; can reach both and External: 2-3 steps; on left going up Has following equipment at home: Walker - 4 wheeled  PLOF: Independent with household mobility with device  PATIENT GOALS "get close to normal as possible"  -Be able to walk without fear of falling -Be able to throw horseshoes/darts -Be able to maintain walking program with trekking poles   OBJECTIVE:   TODAY'S TREATMENT: 04/19/22 Activity Comments  NU-step resistance intervals Heavy:light 1:1 after 2 min warmup to improve fast, alternating coordination  Seated rows for postural re-education to reduce kyphotic posture -lat pull single UE 2x10, 20# -seated overhand row 2x10, 20#  Standing extension stretch with weighted cable belt  For postural re-education and posterior weight shift -Lumbar extensions 2x10 -Lumbar  extension + scap retraction 2x10 -hammer curls 2x10 8# (cues for full ROM) -shoulder rolls 8# 2x10, tactile cues for scap retract/depression   Gait training -Trials for large step length using 22 ft distance and goal of making distance in 12 steps w/ 80% success -retrowalking CGA 4x22 ft               PATIENT EDUCATION: Education details: edu on exam findings and remaining impairments; POC; encouraged improved HEP compliance  Person educated: Patient Education method: Explanation, Demonstration, Tactile cues, Verbal cues, and Handouts Education comprehension: verbalized understanding and returned demonstration   HOME EXERCISE PROGRAM Last updated: 04/04/22 Access Code: [email or web address] URL: [email or web address] Date: 04/04/2022 Prepared by: [facility]  Exercises - Goblet Squat with Kettlebell  - 1 x daily - 7 x weekly - 3 sets - 10 reps - Sumo Squat with Dumbbell  - 1 x daily - 7 x weekly - 3 sets - 10 reps - Step Taps on High Step  - 1 x daily - 5 x weekly - 3 sets - 10 reps - Forward and Backward Step Over with Counter Support  - 1 x daily - 5 x weekly - 3 sets - 10 reps    Below measures were taken at time of initial evaluation unless otherwise specified:   DIAGNOSTIC FINDINGS: none recently  COGNITION: Overall cognitive status: Within functional limits for tasks assessed   SENSATION: WFL  COORDINATION: Grossly intact with some deficits in rapid, alternating and divided movement tasks  EDEMA:    MUSCLE TONE: some rigidity noted in BLE   MUSCLE LENGTH: Knee flexion contractures  DTRs:    POSTURE: forward head  LOWER EXTREMITY ROM:     Active  Right Eval Left Eval  Hip flexion    Hip extension    Hip abduction    Hip adduction    Hip internal rotation    Hip external rotation    Knee flexion    Knee extension -10 -10  Ankle dorsiflexion 5 5  Ankle plantarflexion    Ankle inversion    Ankle  eversion     (Blank rows = not tested)  LOWER EXTREMITY MMT:    Grossly 4/5 BLE  BED MOBILITY:  NT  TRANSFERS: Assistive device  utilized: Programmer, multimedia and None  Sit to stand: Complete Independence Stand to sit: Complete Independence Chair to chair: Modified independence Floor:  NT , pt report need for total assist at this time  RAMP:  NT  CURB:  Level of Assistance: SBA Assistive device utilized: Environmental consultant - 4 wheeled Curb Comments: increased time for processing and negotiation  STAIRS:  Level of Assistance: Modified independence  Stair Negotiation Technique: Alternating Pattern  with Bilateral Rails  Number of Stairs: 5   Height of Stairs: 4-6"  Comments:   GAIT: Gait pattern: shuffling, decreased stride Distance walked:  Assistive device utilized: Environmental consultant - 4 wheeled Level of assistance: SBA Comments:   FUNCTIONAL TESTs:  5 times sit to stand: 11 sec Timed up and go (TUG): 15 sec Mini-BEST test: 14/28 Gait Speed: 16.28 sec = 2.01 ft/sec  PATIENT SURVEYS:     PATIENT EDUCATION: Education details: assessment findings Person educated: Patient Education method: Explanation Education comprehension: verbalized understanding and needs further education   HOME EXERCISE PROGRAM: TBD    GOALS: Goals reviewed with patient? Yes  SHORT TERM GOALS: Target date: 03/31/2022  Patient will be able to perform home program independently for self-management. Baseline: Added to HEP 03/29/2022 Goal status: IN PROGRESS  2.  Improve safety with mobility per time 12 sec TUG to reduce risk for falls Baseline: 15 sec w/ 4WW Goal status:GOAL NOT MET-15.78 sec at best   LONG TERM GOALS: Target date: 05/22/2022  Improve safety and reduce risk for falls with mobility per sore 22/28 Mini-BEST test Baseline: 14/28; 18/28 04/10/22 Goal status: IN PROGRESS 04/10/22  2.  Demonstrate increased gait speed to 3 ft/sec to improve efficiency with community ambulation   Baseline: 2 ft/sec; 2.19 ft/sec 04/10/22 Goal status: IN PROGRESS 04/10/22   3.  Demo supervision level for floor to stand transfers to improve functional mobility and facilitate participation in PD-specific exercise class Baseline: NT, pt reports total assist at this time Goal status: MET 04/10/22  4.  Demonstrate coordination and ability to perform throwing tasks to facilitate participation in leisure activities (horseshoes and darts) Baseline: reports unable to perform Goal status: DEFERRED for OT    ASSESSMENT:  CLINICAL IMPRESSION: Tx session with focus on strengthening for postural re-education with emphasis on scapular retraction/depression to improve UE mobility and postural stability.  Use of external force/cues to promote trunk and head/neck extension during standing activities for postural re-education. Tolerated session well and improved step length by way of using measured step objective. Continued sessions to advance motor control and balance to enhance mobility and gait and reduce risk for falls  OBJECTIVE IMPAIRMENTS Abnormal gait, decreased activity tolerance, decreased balance, decreased coordination, decreased mobility, difficulty walking, decreased ROM, decreased strength, impaired flexibility, improper body mechanics, and postural dysfunction.   ACTIVITY LIMITATIONS carrying, lifting, bending, squatting, transfers, self feeding, and locomotion level  PARTICIPATION LIMITATIONS: cleaning, interpersonal relationship, shopping, community activity, yard work, and leisure activities  [place] Age, Time since onset of injury/illness/exacerbation, and 1 comorbidity: PD, TKR  are also affecting patient's functional outcome.   REHAB POTENTIAL: Good  CLINICAL DECISION MAKING: Evolving/moderate complexity  EVALUATION COMPLEXITY: Moderate  PLAN: PT FREQUENCY: 1x/week  PT DURATION: 6 weeks  PLANNED INTERVENTIONS: Therapeutic exercises, Therapeutic activity,  Neuromuscular re-education, Balance training, Gait training, Patient/Family education, Self Care, Joint mobilization, Stair training, Vestibular training, Canalith repositioning, Orthotic/Fit training, DME instructions, Aquatic Therapy, Dry Needling, Electrical stimulation, Cryotherapy, Moist heat, Taping, and Manual therapy  PLAN FOR NEXT SESSION: Continue to  work on transfers from quadruped to tall kneeling/ half kneeling; continue gait with RW: floor transfer, LARGE amplitude movement patterns, BIG effort to initiate movements  1:55 PM, 04/19/22 M. Sherlyn Lees, PT, DPT Physical Therapist- [place] Office Number: [phone]    [facility] at [facility] [address], [place], [place] [address] Phone # [phone] Fax # [phone]

## 2022-04-25 ENCOUNTER — Ambulatory Visit: Payer: PPO | Admitting: Occupational Therapy

## 2022-04-25 ENCOUNTER — Encounter: Payer: Self-pay | Admitting: Physical Therapy

## 2022-04-25 ENCOUNTER — Ambulatory Visit: Payer: PPO | Attending: Neurology | Admitting: Physical Therapy

## 2022-04-25 DIAGNOSIS — R29818 Other symptoms and signs involving the nervous system: Secondary | ICD-10-CM | POA: Diagnosis not present

## 2022-04-25 DIAGNOSIS — R41841 Cognitive communication deficit: Secondary | ICD-10-CM | POA: Insufficient documentation

## 2022-04-25 DIAGNOSIS — G20A2 Parkinson's disease without dyskinesia, with fluctuations: Secondary | ICD-10-CM | POA: Insufficient documentation

## 2022-04-25 DIAGNOSIS — R293 Abnormal posture: Secondary | ICD-10-CM | POA: Diagnosis not present

## 2022-04-25 DIAGNOSIS — R2689 Other abnormalities of gait and mobility: Secondary | ICD-10-CM | POA: Insufficient documentation

## 2022-04-25 DIAGNOSIS — R278 Other lack of coordination: Secondary | ICD-10-CM | POA: Diagnosis present

## 2022-04-25 DIAGNOSIS — R4701 Aphasia: Secondary | ICD-10-CM | POA: Diagnosis not present

## 2022-04-25 DIAGNOSIS — R471 Dysarthria and anarthria: Secondary | ICD-10-CM | POA: Insufficient documentation

## 2022-04-25 DIAGNOSIS — M6281 Muscle weakness (generalized): Secondary | ICD-10-CM | POA: Insufficient documentation

## 2022-04-25 DIAGNOSIS — R2681 Unsteadiness on feet: Secondary | ICD-10-CM

## 2022-04-25 NOTE — Therapy (Signed)
OUTPATIENT PHYSICAL THERAPY NEURO TREATMENT   Patient Name: Matthew [name] MRN: [id number] DOB:[birth year], 71 y.o., male Today's Date: 03/09/2022   PCP: Lawerance Cruel, MD REFERRING PROVIDER: Carles Collet Eustace Quail, DO    PT End of Session - 04/25/22 1323     Visit Number 14    Number of Visits 16    Date for PT Re-Evaluation 05/22/22    Authorization Type [employer]    Progress Note Due on Visit 20    PT Start Time 1320    PT Stop Time 1400    PT Time Calculation (min) 40 min    Equipment Utilized During Treatment Gait belt    Activity Tolerance Patient tolerated treatment well    Behavior During Therapy WFL for tasks assessed/performed                   Past Medical History:  Diagnosis Date   Anxiety    Arthritis    GERD (gastroesophageal reflux disease)    occ remote history   Hypertension    Parkinson disease    Prostate cancer ([place])    Right knee pain    questionable meniscal tear   Seasonal allergies    Past Surgical History:  Procedure Laterality Date   COLONOSCOPY     Leg Laceration Repair  Left    Age 80   PELVIC LYMPH NODE DISSECTION Bilateral 03/22/2018   Procedure: PELVIC LYMPH NODE DISSECTION;  Surgeon: Ceasar Mons, MD;  Location: [facility];  Service: Urology;  Laterality: Bilateral;   PROSTATE BIOPSY     ROBOT ASSISTED LAPAROSCOPIC RADICAL PROSTATECTOMY N/A 03/22/2018   Procedure: XI ROBOTIC ASSISTED LAPAROSCOPIC PROSTATECTOMY;  Surgeon: Ceasar Mons, MD;  Location: [facility];  Service: Urology;  Laterality: N/A;   TOTAL KNEE ARTHROPLASTY Right 05/05/2019   Procedure: RIGHT TOTAL KNEE ARTHROPLASTY;  Surgeon: Frederik Pear, MD;  Location: [facility];  Service: Orthopedics;  Laterality: Right;   Patient Active Problem List   Diagnosis Date Noted   S/P TKR (total knee replacement), right 05/05/2019   Osteoarthritis of right knee 05/02/2019   Prostate cancer ([place]) 03/22/2018   Malignant neoplasm of prostate ([place])  02/27/2018    ONSET DATE: 2-3 years  REFERRING DIAG: G20.A2 (ICD-10-CM) - Parkinson's disease without dyskinesia, with fluctuating manifestations   THERAPY DIAG:  Other symptoms and signs involving the nervous system  Muscle weakness (generalized)  Unsteadiness on feet  Abnormal posture  Rationale for Evaluation and Treatment Rehabilitation  SUBJECTIVE:  SUBJECTIVE STATEMENT: No falls since last visit.  Still working on my exercises at home.  When I remember to count steps/take as few steps as I can, that helps at home, especially in backwards direction.   Pt accompanied by: self  PERTINENT HISTORY: Parkinson's, right TKR,   PAIN:  Are you having pain? No  PRECAUTIONS: None  WEIGHT BEARING RESTRICTIONS No  FALLS: Has patient fallen in last 6 months? Yes. Number of falls 2-3 , unable to perform fall recovery  LIVING ENVIRONMENT: Lives with: lives with their spouse Lives in: House/apartment Stairs: Yes: Internal: 16 steps; can reach both and External: 2-3 steps; on left going up Has following equipment at home: Walker - 4 wheeled  PLOF: Independent with household mobility with device  PATIENT GOALS "get close to normal as possible"  -Be able to walk without fear of falling -Be able to throw horseshoes/darts -Be able to maintain walking program with trekking poles   OBJECTIVE:    TODAY'S TREATMENT: 04/25/2022 Activity Comments  Sit<>stand with upright posture, 2 x 10 reps From mat surface  Standing march 2 x 10 reps 2# weight  Standing side step x 10 2# weight  Standing step out/out, in/in 2 x 5 reps 2# weight  Forward step up/up, down/down x 10 reps BUE support-cues for full foot placement  Forward/back step and weigthshift with added arm swing 1 UE support at bar, good  foot clearance throughout  Forward/back walking along treadmill bar, 5 reps Good step length  Heel raises 10 x 3 sec   Single limb stance, 3 x 10 sec With UE support  SLS with forward step taps>back to runner's stretch position, 10 reps each leg, BUE support Cues for timing/coordination  Short distance gait with rollator, between activities with cues to "take as few steps as possible" Improved step length with min cues  Gait 50 ft x 6 reps with rollator with cues for increased step length and cues for increased pace     TODAY'S TREATMENT: 04/19/22 Activity Comments  NU-step resistance intervals Heavy:light 1:1 after 2 min warmup to improve fast, alternating coordination  Seated rows for postural re-education to reduce kyphotic posture -lat pull single UE 2x10, 20# -seated overhand row 2x10, 20#  Standing extension stretch with weighted cable belt  For postural re-education and posterior weight shift -Lumbar extensions 2x10 -Lumbar extension + scap retraction 2x10 -hammer curls 2x10 8# (cues for full ROM) -shoulder rolls 8# 2x10, tactile cues for scap retract/depression   Gait training -Trials for large step length using 22 ft distance and goal of making distance in 12 steps w/ 80% success -retrowalking CGA 4x22 ft                 HOME EXERCISE PROGRAM Last updated: 04/04/22 Access Code: [email or web address] URL: [email or web address] Date: 04/04/2022 Prepared by: [facility]  Exercises - Goblet Squat with Kettlebell  - 1 x daily - 7 x weekly - 3 sets - 10 reps - Sumo Squat with Dumbbell  - 1 x daily - 7 x weekly - 3 sets - 10 reps - Step Taps on High Step  - 1 x daily - 5 x weekly - 3 sets - 10 reps - Forward and Backward Step Over with Counter Support  - 1 x daily - 5 x weekly - 3 sets - 10 reps    Below measures were taken at time of initial evaluation unless otherwise specified:   DIAGNOSTIC FINDINGS: none  recently  COGNITION: Overall cognitive status: Within functional limits for tasks assessed   SENSATION: WFL  COORDINATION: Grossly intact with some deficits in rapid, alternating and divided movement tasks  EDEMA:    MUSCLE TONE: some rigidity noted in BLE   MUSCLE LENGTH: Knee flexion contractures  DTRs:    POSTURE: forward head  LOWER EXTREMITY ROM:     Active  Right Eval Left Eval  Hip flexion    Hip extension    Hip abduction    Hip adduction    Hip internal rotation    Hip external rotation    Knee flexion    Knee extension -10 -10  Ankle dorsiflexion 5 5  Ankle plantarflexion    Ankle inversion    Ankle eversion     (Blank rows = not tested)  LOWER EXTREMITY MMT:    Grossly 4/5 BLE  BED MOBILITY:  NT  TRANSFERS: Assistive device utilized: Quad cane large base and None  Sit to stand: Complete Independence Stand to sit: Complete Independence Chair to chair: Modified independence Floor:  NT , pt report need for total assist at this time  RAMP:  NT  CURB:  Level of Assistance: SBA Assistive device utilized: Environmental consultant - 4 wheeled Curb Comments: increased time for processing and negotiation  STAIRS:  Level of Assistance: Modified Artist Technique: Alternating Pattern  with Bilateral Rails  Number of Stairs: 5   Height of Stairs: 4-6"  Comments:   GAIT: Gait pattern: shuffling, decreased stride Distance walked:  Assistive device utilized: Environmental consultant - 4 wheeled Level of assistance: SBA Comments:   FUNCTIONAL TESTs:  5 times sit to stand: 11 sec Timed up and go (TUG): 15 sec Mini-BEST test: 14/28 Gait Speed: 16.28 sec = 2.01 ft/sec  PATIENT SURVEYS:     PATIENT EDUCATION: Education details: assessment findings Person educated: Patient Education method: Explanation Education comprehension: verbalized understanding and needs further education   HOME EXERCISE PROGRAM: TBD    GOALS: Goals reviewed with  patient? Yes  SHORT TERM GOALS: Target date: 03/31/2022  Patient will be able to perform home program independently for self-management. Baseline: Added to HEP 03/29/2022 Goal status: IN PROGRESS  2.  Improve safety with mobility per time 12 sec TUG to reduce risk for falls Baseline: 15 sec w/ 4WW Goal status:GOAL NOT MET-15.78 sec at best   LONG TERM GOALS: Target date: 05/22/2022  Improve safety and reduce risk for falls with mobility per sore 22/28 Mini-BEST test Baseline: 14/28; 18/28 04/10/22 Goal status: IN PROGRESS 04/10/22  2.  Demonstrate increased gait speed to 3 ft/sec to improve efficiency with community ambulation  Baseline: 2 ft/sec; 2.19 ft/sec 04/10/22 Goal status: IN PROGRESS 04/10/22   3.  Demo supervision level for floor to stand transfers to improve functional mobility and facilitate participation in PD-specific exercise class Baseline: NT, pt reports total assist at this time Goal status: MET 04/10/22  4.  Demonstrate coordination and ability to perform throwing tasks to facilitate participation in leisure activities (horseshoes and darts) Baseline: reports unable to perform Goal status: DEFERRED for OT    ASSESSMENT:  CLINICAL IMPRESSION: Focused skilled PT session today on lower extremity strength and timing/coordination of gait.  He continues to respond well to cues to "take as few steps as possible, and reports this strategy is working well at home.  With standing exercises, he does have heavy reliance on BUEs at times and needs cues to lessen UE support and return to upright posture.  Also noted difficulty with SLS with lessened/no UE support today.  He will continue to benefit from skilled PT towards goals for improved functional mobility and decreased fall risk.    OBJECTIVE IMPAIRMENTS Abnormal gait, decreased activity tolerance, decreased balance, decreased coordination, decreased mobility, difficulty walking, decreased ROM, decreased strength,  impaired flexibility, improper body mechanics, and postural dysfunction.   ACTIVITY LIMITATIONS carrying, lifting, bending, squatting, transfers, self feeding, and locomotion level  PARTICIPATION LIMITATIONS: cleaning, interpersonal relationship, shopping, community activity, yard work, and leisure activities  [place] Age, Time since onset of injury/illness/exacerbation, and 1 comorbidity: PD, TKR  are also affecting patient's functional outcome.   REHAB POTENTIAL: Good  CLINICAL DECISION MAKING: Evolving/moderate complexity  EVALUATION COMPLEXITY: Moderate  PLAN: PT FREQUENCY: 1x/week  PT DURATION: 6 weeks  PLANNED INTERVENTIONS: Therapeutic exercises, Therapeutic activity, Neuromuscular re-education, Balance training, Gait training, Patient/Family education, Self Care, Joint mobilization, Stair training, Vestibular training, Canalith repositioning, Orthotic/Fit training, DME instructions, Aquatic Therapy, Dry Needling, Electrical stimulation, Cryotherapy, Moist heat, Taping, and Manual therapy  PLAN FOR NEXT SESSION: (# of visits in POC is 55 and next visit is 16-will need to discuss POC by visit 17).  Continue to work towards [place], focusing on large amplitude movement patterns, posture, floor to stand transfers.    Mady Haagensen, PT 04/25/22 3:45 PM Phone: [phone] Fax: [phone]    [facility] [place], [place], [place] [address] Phone # [phone] Fax # [phone]

## 2022-04-25 NOTE — Therapy (Signed)
OUTPATIENT OCCUPATIONAL THERAPY  Treatment Note  Patient Name: Matthew [name] MRN: [id number] DOB:[birth year], 71 y.o., male Today's Date: 04/25/2022  PCP: Lawerance Cruel, MD REFERRING PROVIDER: Carles Collet Eustace Quail, DO    OT End of Session - 04/25/22 3419     Visit Number 11    Number of Visits 17    Date for OT Re-Evaluation 05/05/22    Authorization Type [employer]    OT Start Time 6222    OT Stop Time 1445    OT Time Calculation (min) 42 min                     Past Medical History:  Diagnosis Date   Anxiety    Arthritis    GERD (gastroesophageal reflux disease)    occ remote history   Hypertension    Parkinson disease    Prostate cancer ([place])    Right knee pain    questionable meniscal tear   Seasonal allergies    Past Surgical History:  Procedure Laterality Date   COLONOSCOPY     Leg Laceration Repair  Left    Age 27   PELVIC LYMPH NODE DISSECTION Bilateral 03/22/2018   Procedure: PELVIC LYMPH NODE DISSECTION;  Surgeon: Ceasar Mons, MD;  Location: [facility];  Service: Urology;  Laterality: Bilateral;   PROSTATE BIOPSY     ROBOT ASSISTED LAPAROSCOPIC RADICAL PROSTATECTOMY N/A 03/22/2018   Procedure: XI ROBOTIC ASSISTED LAPAROSCOPIC PROSTATECTOMY;  Surgeon: Ceasar Mons, MD;  Location: [facility];  Service: Urology;  Laterality: N/A;   TOTAL KNEE ARTHROPLASTY Right 05/05/2019   Procedure: RIGHT TOTAL KNEE ARTHROPLASTY;  Surgeon: Frederik Pear, MD;  Location: [facility];  Service: Orthopedics;  Laterality: Right;   Patient Active Problem List   Diagnosis Date Noted   S/P TKR (total knee replacement), right 05/05/2019   Osteoarthritis of right knee 05/02/2019   Prostate cancer ([place]) 03/22/2018   Malignant neoplasm of prostate ([place]) 02/27/2018    ONSET DATE: referral 03/02/22  REFERRING DIAG: G20.A2 (ICD-10-CM) - Parkinson's disease without dyskinesia, with fluctuating manifestations   THERAPY DIAG:  Other symptoms and  signs involving the nervous system  Muscle weakness (generalized)  Other lack of coordination  Abnormal posture  Unsteadiness on feet  Rationale for Evaluation and Treatment Rehabilitation  SUBJECTIVE:   SUBJECTIVE STATEMENT: Pt reports things seem to be "about the same" with continued difficulty with aspects of dressing. Pt accompanied by: self and spouse dropped pt off  PERTINENT HISTORY: anxiety, arthritis, GERD, HTN, R TKR  PRECAUTIONS: Fall  WEIGHT BEARING RESTRICTIONS No  PAIN:  Are you having pain? No  FALLS: Has patient fallen in last 6 months? Yes. Number of falls 2  PLOF: Requires assistive device for independence and Needs assistance with ADLs  PATIENT GOALS to get as close to normal as possible  OBJECTIVE:   HAND DOMINANCE: Right   FUNCTIONAL OUTCOME MEASURES: Physical performance test: PPT #2 (self-feeding):17.66 sec *     PPT #4 (jacket on/off): Attempted but pt unable to complete due to increased lightheadedness in standing and difficulty with previously educated "cape technique"     3 button/unbutton: 2:56.5  COORDINATION: From screen on 03/02/22 9 Hole Peg test: Right: 49.13 sec; Left: 44.53 sec  MUSCLE TONE: RUE: Mild and LUE: Mild  COGNITION: slower processing overall  VISION: Baseline vision: Bifocals and Wears glasses all the time   TODAY'S TREATMENT:  04/25/22 Jacket: Massed practice with doffing/donning personal vest. OT provided demonstration, mod verbal cues,  and use of mirror for visual feedback to facilitate increased attention to shoulder ROM.  Pt continues to get stuck at head/neck due to decreased shoulder ROM and ability to advance coat around shoulders.  Therapeutic exercise: Engaged in shoulder flexion RUE then LUE with use of mirror for visual feedback for increased shoulder flexion.  Pt demonstrating increased shoulder flexion L > R, therefore continue to encourage pt to don jacket/vest bringing it around R shoulder to allow  for increased LUE ROM.  Engaged in reaching overhead and back towards back of head in swooping motion as needed for donning jacket.  Pt continues to demonstrate decreased ability to reach towards back of head, limiting ROM when donning jacket.    04/19/22 9 hole peg test: R: 1:16.81 seconds.  Completed initially for [facility] then completed while timing.  Pt demonstrating increased difficulty during 2nd attempt compared to first. Adaptive equipment: reviewed button hook.  Pt reports that he now tries to avoid shirts with buttons, but may consider purchasing button hook as it helped significantly during previous session.  Reviewed reacher for LB dressing.  Pt demonstrating improved forward flexion and leg lifting with simulated LB dressing, therefore do not recommend use of reacher at this time. Simulated LB dressing: Completed bag exercises 5x2 bilaterally with focus on coordinating lifting leg and forward reach simultaneously to simulate threading pants.  Pt completed with gait belt to simulate waist band to allow for completion with BLE.  Pt with increased difficulty L > R as well as first time compared to subsequent repetitions.  Jacket: ~ 5 mins to doff/don jacket.  Pt able to doff in ~1 minute, but continues to require significantly increased time when donning due to freezing when attempting to swing jacket around shoulders in "cape technique".      04/12/22 Large amplitude: forward reaching with elbow and wrist extension with finger opening x10 and then arm opening/horizontal abduction x10 in preparation for jacket. ADL: donning/doffing jacket with "cape technique".  Pt with significant difficulty with donning and doffing jacket requiring significant increase in time.  Pt demonstrate min increased ease with donning R arm first, however still requiring significant increase in time. Utilized Geologist, engineering for increased feedback of mobility, drawing attention to shoulder when attempting to doff jacket over  shoulder.  OT providing demonstration, verbal cues, and intermittent tactile cues to facilitate increased technique, with minimal carryover. Large shoulder roll with abduction to simulate doffing jacket.  Completed with BUE and then R/L UE separately to simulate movement as needed for doffing jacket with focus on large amplitude opening of shoulders and chest.  Again utilized mirror for increased feedback of movements.    PATIENT EDUCATION: Education details: educated on large amplitude movements/shoulder ROM as needed for UB dressing and donning/doffing jacket. Person educated: Patient Education method: Customer service manager Education comprehension: verbalized understanding   HOME EXERCISE PROGRAM: Bag exercises - see pt instructions    GOALS: Goals reviewed with patient? Yes  SHORT TERM GOALS: Target date: 04/07/22  Pt will be Independent with PD specific HEP Baseline: Goal status: MET - reports completing 2-3x/week  2.  Pt will verbalize understanding of adapted strategies and DME/AE PRN (reacher, rocker knife, scoop plate/plate guard, etc) to maximize safety and independence with ADLs/IADLs. Baseline:  Goal status: IN PROGRESS - ongoing 04/06/22  3.  Pt will demonstrate improved shoulder flexion bilaterally to retreive a lightweight object from overhead shelf with BUE at >/= 130 shoulder flexion. Baseline:  Goal status: NOT MET - Right: 120*  and Left: 124*  LONG TERM GOALS: Target date: 05/05/22  Pt will demonstrate increased functional ROM and use of BUE to aid in clothing management and hygiene with toileting tasks. Baseline:  Goal status: IN PROGRESS  2.  Pt will demonstrate improved fine motor coordination for ADLs as evidenced by decreasing 9 hole peg test score for RUE by 3 secs  Baseline: 9 Hole Peg test: Right: 49.13 sec; Left: 44.53 sec Goal status: IN PROGRESS  3.  Pt will demonstrate increased ease with dressing as evidenced by decreasing PPT#4(don/  doff jacket) by 30 seconds  Baseline: time TBA at next session Goal status: IN PROGRESS  4.  Pt will demonstrate improved ease with feeding as evidenced by decreasing PPT#2 (self feeding) by 3 secs  Baseline: 17.66 sec Goal status: IN PROGRESS  5. Pt will demonstrate and/or report improved independence with LB dressing with use of AE/DME as needed.  Baseline:  Goal status: IN PROGRESS   ASSESSMENT:  CLINICAL IMPRESSION: Treatment session with focus on self-care tasks shoulder ROM and donning/doffing jacket. Pt benefiting from use of mirror with shoulder ROM, especially with shoulder flexion and "swooping" motion around top of head. Pt continues to demonstrate forward shoulders and neck flexion impacting quality of movements, especially when donning/doffing jacket.  PERFORMANCE DEFICITS in functional skills including ADLs, IADLs, coordination, tone, ROM, strength, flexibility, FMC, GMC, balance, body mechanics, endurance, decreased knowledge of use of DME, and UE functional use and psychosocial skills including environmental adaptation and routines and behaviors.   IMPAIRMENTS are limiting patient from ADLs and IADLs.   COMORBIDITIES may have co-morbidities  that affects occupational performance. Patient will benefit from skilled OT to address above impairments and improve overall function.  MODIFICATION OR ASSISTANCE TO COMPLETE EVALUATION: Min-Moderate modification of tasks or assist with assess necessary to complete an evaluation.  OT OCCUPATIONAL PROFILE AND HISTORY: Detailed assessment: Review of records and additional review of physical, cognitive, psychosocial history related to current functional performance.  CLINICAL DECISION MAKING: Moderate - several treatment options, min-mod task modification necessary  REHAB POTENTIAL: Good  EVALUATION COMPLEXITY: Moderate    PLAN: OT FREQUENCY: 2x/week  OT DURATION: 8 weeks  PLANNED INTERVENTIONS: self care/ADL training,  therapeutic exercise, therapeutic activity, neuromuscular re-education, manual therapy, passive range of motion, balance training, functional mobility training, ultrasound, compression bandaging, moist heat, cryotherapy, patient/family education, cognitive remediation/compensation, energy conservation, and DME and/or AE instructions  RECOMMENDED OTHER SERVICES: N/A  CONSULTED AND AGREED WITH PLAN OF CARE: Patient  PLAN FOR NEXT SESSION: Review and add to bag exercises for dressing tasks - complete in front of mirror for feedback, introduce AE/DME for increased safety/independence with bathing/dressing tasks.  Continue to assess AE and self-feeding.  Check in on use of button hook for managing buttons.  Simonne Come, OTR/L 04/25/2022, 2:06 PM

## 2022-05-02 ENCOUNTER — Ambulatory Visit: Payer: PPO | Admitting: Occupational Therapy

## 2022-05-02 DIAGNOSIS — R278 Other lack of coordination: Secondary | ICD-10-CM

## 2022-05-02 DIAGNOSIS — M6281 Muscle weakness (generalized): Secondary | ICD-10-CM

## 2022-05-02 DIAGNOSIS — R29818 Other symptoms and signs involving the nervous system: Secondary | ICD-10-CM | POA: Diagnosis not present

## 2022-05-02 DIAGNOSIS — R293 Abnormal posture: Secondary | ICD-10-CM

## 2022-05-02 DIAGNOSIS — R2681 Unsteadiness on feet: Secondary | ICD-10-CM

## 2022-05-02 NOTE — Therapy (Signed)
OUTPATIENT OCCUPATIONAL THERAPY  Treatment Note  Patient Name: Matthew [name] MRN: [id number] DOB:[birth year], 71 y.o., male Today's Date: 05/02/2022  PCP: Lawerance Cruel, MD REFERRING PROVIDER: Carles Collet Eustace Quail, DO    OT End of Session - 05/02/22 1412     Visit Number 12    Number of Visits 17    Date for OT Re-Evaluation 05/05/22    Authorization Type [employer]    OT Start Time 1408    OT Stop Time 1448    OT Time Calculation (min) 40 min                      Past Medical History:  Diagnosis Date   Anxiety    Arthritis    GERD (gastroesophageal reflux disease)    occ remote history   Hypertension    Parkinson disease    Prostate cancer ([place])    Right knee pain    questionable meniscal tear   Seasonal allergies    Past Surgical History:  Procedure Laterality Date   COLONOSCOPY     Leg Laceration Repair  Left    Age 20   PELVIC LYMPH NODE DISSECTION Bilateral 03/22/2018   Procedure: PELVIC LYMPH NODE DISSECTION;  Surgeon: Ceasar Mons, MD;  Location: [facility];  Service: Urology;  Laterality: Bilateral;   PROSTATE BIOPSY     ROBOT ASSISTED LAPAROSCOPIC RADICAL PROSTATECTOMY N/A 03/22/2018   Procedure: XI ROBOTIC ASSISTED LAPAROSCOPIC PROSTATECTOMY;  Surgeon: Ceasar Mons, MD;  Location: [facility];  Service: Urology;  Laterality: N/A;   TOTAL KNEE ARTHROPLASTY Right 05/05/2019   Procedure: RIGHT TOTAL KNEE ARTHROPLASTY;  Surgeon: Frederik Pear, MD;  Location: [facility];  Service: Orthopedics;  Laterality: Right;   Patient Active Problem List   Diagnosis Date Noted   S/P TKR (total knee replacement), right 05/05/2019   Osteoarthritis of right knee 05/02/2019   Prostate cancer ([place]) 03/22/2018   Malignant neoplasm of prostate ([place]) 02/27/2018    ONSET DATE: referral 03/02/22  REFERRING DIAG: G20.A2 (ICD-10-CM) - Parkinson's disease without dyskinesia, with fluctuating manifestations   THERAPY DIAG:  Other symptoms and  signs involving the nervous system  Muscle weakness (generalized)  Abnormal posture  Other lack of coordination  Unsteadiness on feet  Rationale for Evaluation and Treatment Rehabilitation  SUBJECTIVE:   SUBJECTIVE STATEMENT: Pt reports continued spillage of foods with self-feeding. Pt accompanied by: self and spouse dropped pt off  PERTINENT HISTORY: anxiety, arthritis, GERD, HTN, R TKR  PRECAUTIONS: Fall  WEIGHT BEARING RESTRICTIONS No  PAIN:  Are you having pain? No  FALLS: Has patient fallen in last 6 months? Yes. Number of falls 2  PLOF: Requires assistive device for independence and Needs assistance with ADLs  PATIENT GOALS to get as close to normal as possible  OBJECTIVE:   HAND DOMINANCE: Right   FUNCTIONAL OUTCOME MEASURES: Physical performance test: PPT #2 (self-feeding):17.66 sec *     PPT #4 (jacket on/off): Attempted but pt unable to complete due to increased lightheadedness in standing and difficulty with previously educated "cape technique"     3 button/unbutton: 2:56.5  COORDINATION: From screen on 03/02/22 9 Hole Peg test: Right: 49.13 sec; Left: 44.53 sec  MUSCLE TONE: RUE: Mild and LUE: Mild  COGNITION: slower processing overall  VISION: Baseline vision: Bifocals and Wears glasses all the time   TODAY'S TREATMENT:  05/02/22 Self-feeding: 20.85 sec to scoop 5 beans from bowl into container.  Completed additional time with focus on quality of  movement, purposeful movements with improvement to 15.88 sec.  Discussed strategies to keep sandwiches together, as pt reports items falling out of sandwich.  Practiced with scooping multiple beans to ensure proper hand placement on utensil and use of knife as buffer to decrease spillage.   9 hole peg test: R: 51.46 sec L: 45.96 sec.  Completed large amplitude hand flicks and thumb opposition.  Pt completed 2nd attempt with R: 1:10 and L: 39.25 sec.  Pt reports feeling that large amplitude exercises  sometimes actually slow him down due to onset of fatigue.     04/25/22 Jacket: Massed practice with doffing/donning personal vest. OT provided demonstration, mod verbal cues, and use of mirror for visual feedback to facilitate increased attention to shoulder ROM.  Pt continues to get stuck at head/neck due to decreased shoulder ROM and ability to advance coat around shoulders.  Therapeutic exercise: Engaged in shoulder flexion RUE then LUE with use of mirror for visual feedback for increased shoulder flexion.  Pt demonstrating increased shoulder flexion L > R, therefore continue to encourage pt to don jacket/vest bringing it around R shoulder to allow for increased LUE ROM.  Engaged in reaching overhead and back towards back of head in swooping motion as needed for donning jacket.  Pt continues to demonstrate decreased ability to reach towards back of head, limiting ROM when donning jacket.   04/19/22 9 hole peg test: R: 1:16.81 seconds.  Completed initially for [facility] then completed while timing.  Pt demonstrating increased difficulty during 2nd attempt compared to first. Adaptive equipment: reviewed button hook.  Pt reports that he now tries to avoid shirts with buttons, but may consider purchasing button hook as it helped significantly during previous session.  Reviewed reacher for LB dressing.  Pt demonstrating improved forward flexion and leg lifting with simulated LB dressing, therefore do not recommend use of reacher at this time. Simulated LB dressing: Completed bag exercises 5x2 bilaterally with focus on coordinating lifting leg and forward reach simultaneously to simulate threading pants.  Pt completed with gait belt to simulate waist band to allow for completion with BLE.  Pt with increased difficulty L > R as well as first time compared to subsequent repetitions.  Jacket: ~ 5 mins to doff/don jacket.  Pt able to doff in ~1 minute, but continues to require significantly increased time when donning  due to freezing when attempting to swing jacket around shoulders in "cape technique".     PATIENT EDUCATION: Education details: educated on large amplitude movements/shoulder ROM as needed for UB dressing and donning/doffing jacket. Person educated: Patient Education method: Customer service manager Education comprehension: verbalized understanding   HOME EXERCISE PROGRAM: Bag exercises - see pt instructions    GOALS: Goals reviewed with patient? Yes  SHORT TERM GOALS: Target date: 04/07/22  Pt will be Independent with PD specific HEP Baseline: Goal status: MET - reports completing 2-3x/week  2.  Pt will verbalize understanding of adapted strategies and DME/AE PRN (reacher, rocker knife, scoop plate/plate guard, etc) to maximize safety and independence with ADLs/IADLs. Baseline:  Goal status: IN PROGRESS - ongoing 04/06/22  3.  Pt will demonstrate improved shoulder flexion bilaterally to retreive a lightweight object from overhead shelf with BUE at >/= 130 shoulder flexion. Baseline:  Goal status: NOT MET - Right: 120* and Left: 124*  LONG TERM GOALS: Target date: 05/05/22  Pt will demonstrate increased functional ROM and use of BUE to aid in clothing management and hygiene with toileting tasks. Baseline:  Goal  status: IN PROGRESS  2.  Pt will demonstrate improved fine motor coordination for ADLs as evidenced by decreasing 9 hole peg test score for RUE by 3 secs  Baseline: 9 Hole Peg test: Right: 49.13 sec; Left: 44.53 sec Goal status: IN PROGRESS  3.  Pt will demonstrate increased ease with dressing as evidenced by decreasing PPT#4(don/ doff jacket) by 30 seconds  Baseline: time TBA at next session Goal status: IN PROGRESS  4.  Pt will demonstrate improved ease with feeding as evidenced by decreasing PPT#2 (self feeding) by 3 secs  Baseline: 17.66 sec Goal status: IN PROGRESS  5. Pt will demonstrate and/or report improved independence with LB dressing with use  of AE/DME as needed.  Baseline:  Goal status: IN PROGRESS   ASSESSMENT:  CLINICAL IMPRESSION: Treatment session with focus on fine motor control tasks as needed for clothing fasteners and self-feeding. Pt demonstrating mixed results from incorporation of large amplitude exercises due to onset of fatigue with repetition, therefore slowing pt down with coordination and dressing tasks.  PERFORMANCE DEFICITS in functional skills including ADLs, IADLs, coordination, tone, ROM, strength, flexibility, FMC, GMC, balance, body mechanics, endurance, decreased knowledge of use of DME, and UE functional use and psychosocial skills including environmental adaptation and routines and behaviors.   IMPAIRMENTS are limiting patient from ADLs and IADLs.   COMORBIDITIES may have co-morbidities  that affects occupational performance. Patient will benefit from skilled OT to address above impairments and improve overall function.  MODIFICATION OR ASSISTANCE TO COMPLETE EVALUATION: Min-Moderate modification of tasks or assist with assess necessary to complete an evaluation.  OT OCCUPATIONAL PROFILE AND HISTORY: Detailed assessment: Review of records and additional review of physical, cognitive, psychosocial history related to current functional performance.  CLINICAL DECISION MAKING: Moderate - several treatment options, min-mod task modification necessary  REHAB POTENTIAL: Good  EVALUATION COMPLEXITY: Moderate    PLAN: OT FREQUENCY: 2x/week  OT DURATION: 8 weeks  PLANNED INTERVENTIONS: self care/ADL training, therapeutic exercise, therapeutic activity, neuromuscular re-education, manual therapy, passive range of motion, balance training, functional mobility training, ultrasound, compression bandaging, moist heat, cryotherapy, patient/family education, cognitive remediation/compensation, energy conservation, and DME and/or AE instructions  RECOMMENDED OTHER SERVICES: N/A  CONSULTED AND AGREED WITH PLAN  OF CARE: Patient  PLAN FOR NEXT SESSION: Review and add to bag exercises for dressing tasks - complete in front of mirror for feedback, introduce AE/DME for increased safety/independence with bathing/dressing tasks.  Continue to assess AE and self-feeding.  Check in on use of button hook for managing buttons.  Simonne Come, OTR/L 05/02/2022, 2:12 PM

## 2022-05-04 ENCOUNTER — Ambulatory Visit: Payer: PPO | Admitting: Physical Therapy

## 2022-05-04 ENCOUNTER — Encounter: Payer: Self-pay | Admitting: Physical Therapy

## 2022-05-04 ENCOUNTER — Ambulatory Visit: Payer: PPO

## 2022-05-04 ENCOUNTER — Ambulatory Visit: Payer: PPO | Admitting: Occupational Therapy

## 2022-05-04 DIAGNOSIS — R4701 Aphasia: Secondary | ICD-10-CM

## 2022-05-04 DIAGNOSIS — R29818 Other symptoms and signs involving the nervous system: Secondary | ICD-10-CM

## 2022-05-04 DIAGNOSIS — R41841 Cognitive communication deficit: Secondary | ICD-10-CM

## 2022-05-04 DIAGNOSIS — R2689 Other abnormalities of gait and mobility: Secondary | ICD-10-CM

## 2022-05-04 DIAGNOSIS — M6281 Muscle weakness (generalized): Secondary | ICD-10-CM

## 2022-05-04 DIAGNOSIS — R2681 Unsteadiness on feet: Secondary | ICD-10-CM

## 2022-05-04 DIAGNOSIS — R278 Other lack of coordination: Secondary | ICD-10-CM

## 2022-05-04 DIAGNOSIS — R471 Dysarthria and anarthria: Secondary | ICD-10-CM

## 2022-05-04 DIAGNOSIS — R293 Abnormal posture: Secondary | ICD-10-CM

## 2022-05-04 NOTE — Therapy (Signed)
[facility] [address], [place], [place], [address] Phone: [phone]   Fax:  [phone]  Patient Details  Name: Matthew [name] MRN: [id number] Date of Birth: 06/30/1950 Referring Provider: Alonza Bogus, DO  Encounter Date: 05/04/2022  SPEECH THERAPY DISCHARGE SUMMARY  Visits from Start of Care: 8  Current functional level related to goals / functional outcomes: See below. Pt did not schedule more therapy after his 7th therapy visit (8th total) in March 2023. He has a new script dated November 2023.   SLP Short Term Goals - 07/18/21 2322                SLP SHORT TERM GOAL #1    Title pt will produce /a/ with average upper 80s dB at 30 cm over 3 sessions     Baseline 06-15-21. 06-22-21     Status Partially Met     Target Date 07/01/21          SLP SHORT TERM GOAL #2    Title pt will demo abdominal breathing with sentence responses 85% accuracy x 3 sessions     Status Achieved     Target Date 07/01/21          SLP SHORT TERM GOAL #3    Title pt will generate 5 minutes simple conversation with low 70s dB average in 3 sessions     Baseline 06-28-21     Status Partially Met     Target Date 07/01/21          SLP SHORT TERM GOAL #4    Title pt will undergo insturmental swallow assessment (MBSS or FEES) if clinically indicated     Time 4     Period Weeks     Status Deferred     Target Date 07/01/21                     SLP Long Term Goals - 07/25/21 1626                SLP LONG TERM GOAL #1    Title pt will use abdominal breathing >80% of the time during 10 minute simple conversation in 3 sessions     Baseline 07-18-21, 07-25-21     Time 8     Period Weeks     Status On-going     Target Date 08/05/21          SLP LONG TERM GOAL #2    Title pt will maintain upper 60s dB average in 10 minute simple conversation x 3 sessions     Baseline 06-28-21, 07-04-21     Status Achieved          SLP LONG TERM GOAL #3    Title  pt will report fewer requests to repeat himself than prior to ST in 3 sessions     Time 8     Period Weeks     Status On-going     Target Date 08/05/21          SLP LONG TERM GOAL #4    Title pt will produce average upper 60s dB in 10 minutes simple to mod complex conversation in 5 sessions     Baseline 06-28-21, 07-04-21, 07-18-21, 07-25-21     Time 8     Period Weeks     Status Revised     Target Date 08/05/21          SLP LONG  TERM GOAL #5    Title pt will score better on a speech QOL questionairre than in the first 1-2 ST sessions     Time 8     Period Weeks     Status On-going                     Plan - 07/25/21 1625       Clinical Impression Statement [facility]") presents today with 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 Today pt demonstrated more success maintaining WNL volume with conversation when conversation was approx 10-12 minutes in length. SLP believes pt will cont to benefit from skilled ST targeting speech loudness and clarity. Pt may require an instrumental swallow evaluation during this therapy course and DO signature on this plan of care will be understood to mean that referral for MBSS or FEES is provided.       Remaining deficits: Dysarthria.   Education / Equipment: See therapy notes.   Patient agrees to discharge. Patient goals were partially met. Patient is being discharged due to not returning since the last visit.Marland Kitchen   [facility], [place] 05/04/2022, 5:48 PM  [facility] [name], [place], [place], [address] Phone: [phone]   Fax:  [phone]

## 2022-05-04 NOTE — Therapy (Signed)
OUTPATIENT PHYSICAL THERAPY NEURO TREATMENT   Patient Name: Matthew [name] MRN: [id number] DOB:[birth year], 71 y.o., male Today's Date: 03/09/2022   PCP: Lawerance Cruel, MD REFERRING PROVIDER: Carles Collet Eustace Quail, DO    PT End of Session - 05/04/22 1448     Visit Number 15    Number of Visits 16    Date for PT Re-Evaluation 05/22/22    Authorization Type [employer]    Progress Note Due on Visit 20    PT Start Time 1450    PT Stop Time 5271    PT Time Calculation (min) 41 min    Equipment Utilized During Treatment Gait belt    Activity Tolerance Patient tolerated treatment well    Behavior During Therapy WFL for tasks assessed/performed                   Past Medical History:  Diagnosis Date   Anxiety    Arthritis    GERD (gastroesophageal reflux disease)    occ remote history   Hypertension    Parkinson disease    Prostate cancer ([place])    Right knee pain    questionable meniscal tear   Seasonal allergies    Past Surgical History:  Procedure Laterality Date   COLONOSCOPY     Leg Laceration Repair  Left    Age 37   PELVIC LYMPH NODE DISSECTION Bilateral 03/22/2018   Procedure: PELVIC LYMPH NODE DISSECTION;  Surgeon: Ceasar Mons, MD;  Location: [facility];  Service: Urology;  Laterality: Bilateral;   PROSTATE BIOPSY     ROBOT ASSISTED LAPAROSCOPIC RADICAL PROSTATECTOMY N/A 03/22/2018   Procedure: XI ROBOTIC ASSISTED LAPAROSCOPIC PROSTATECTOMY;  Surgeon: Ceasar Mons, MD;  Location: [facility];  Service: Urology;  Laterality: N/A;   TOTAL KNEE ARTHROPLASTY Right 05/05/2019   Procedure: RIGHT TOTAL KNEE ARTHROPLASTY;  Surgeon: Frederik Pear, MD;  Location: [facility];  Service: Orthopedics;  Laterality: Right;   Patient Active Problem List   Diagnosis Date Noted   S/P TKR (total knee replacement), right 05/05/2019   Osteoarthritis of right knee 05/02/2019   Prostate cancer ([place]) 03/22/2018   Malignant neoplasm of prostate ([place])  02/27/2018    ONSET DATE: 2-3 years  REFERRING DIAG: G20.A2 (ICD-10-CM) - Parkinson's disease without dyskinesia, with fluctuating manifestations   THERAPY DIAG:  Other abnormalities of gait and mobility  Other symptoms and signs involving the nervous system  Unsteadiness on feet  Muscle weakness (generalized)  Rationale for Evaluation and Treatment Rehabilitation  SUBJECTIVE:  SUBJECTIVE STATEMENT: Same old stuff going on.  I'm sleepy all the time.  Continuing to have issues with balance.  Went to the exercise group party last week.  Pt accompanied by: self  PERTINENT HISTORY: Parkinson's, right TKR,   PAIN:  Are you having pain? No  PRECAUTIONS: None  WEIGHT BEARING RESTRICTIONS No  FALLS: Has patient fallen in last 6 months? Yes. Number of falls 2-3 , unable to perform fall recovery  LIVING ENVIRONMENT: Lives with: lives with their spouse Lives in: House/apartment Stairs: Yes: Internal: 16 steps; can reach both and External: 2-3 steps; on left going up Has following equipment at home: Walker - 4 wheeled  PLOF: Independent with household mobility with device  PATIENT GOALS "get close to normal as possible"  -Be able to walk without fear of falling -Be able to throw horseshoes/darts -Be able to maintain walking program with trekking poles   OBJECTIVE:    TODAY'S TREATMENT: 05/04/2022 Activity Comments  Sit<>stand 5 reps   Heel/toe raises 2 x 10 reps   Stagger stance forward/back weightshift 10 reps Cues for deliberate foot placement  Lateral weightshift>shift and lift for increased SLS>added single UE lift For progression of SLS, cues for tall reset between sets  Forward>back step and weigthshift Already in HEP, cues to make sure to do this regularly  Squat to up on toes  UE support  March in place, 1 UE support x 10 reps Cues for increased height of marching  Standing wide BOS:  head turns x 5, head nods x 5; step and turn, clock turning 360 degrees With UE support    PATIENT EDUCATION: Education details: in pt's HEP notebook-emphasized forward/back stepping as regular exercise.  Discussed/reviewed that for optimal power of lower extremities (with exercises, with exercise classes), he needs UE support  Person educated: Patient Education method: Explanation and Demonstration Education comprehension: verbalized understanding and returned demonstration    HOME EXERCISE PROGRAM Last updated: 04/04/22 Access Code: [email or web address] URL: [email or web address] Date: 04/04/2022 Prepared by: [facility]  Exercises - Goblet Squat with Kettlebell  - 1 x daily - 7 x weekly - 3 sets - 10 reps - Sumo Squat with Dumbbell  - 1 x daily - 7 x weekly - 3 sets - 10 reps - Step Taps on High Step  - 1 x daily - 5 x weekly - 3 sets - 10 reps - Forward and Backward Step Over with Counter Support  - 1 x daily - 5 x weekly - 3 sets - 10 reps    Below measures were taken at time of initial evaluation unless otherwise specified:   DIAGNOSTIC FINDINGS: none recently  COGNITION: Overall cognitive status: Within functional limits for tasks assessed   SENSATION: WFL  COORDINATION: Grossly intact with some deficits in rapid, alternating and divided movement tasks  EDEMA:    MUSCLE TONE: some rigidity noted in BLE   MUSCLE LENGTH: Knee flexion contractures  DTRs:    POSTURE: forward head  LOWER EXTREMITY ROM:     Active  Right Eval Left Eval  Hip flexion    Hip extension    Hip abduction    Hip adduction    Hip internal rotation    Hip external rotation    Knee flexion    Knee extension -10 -10  Ankle dorsiflexion 5 5  Ankle plantarflexion    Ankle inversion    Ankle eversion     (Blank rows = not  tested)  LOWER EXTREMITY MMT:    Grossly 4/5 BLE  BED MOBILITY:  NT  TRANSFERS: Assistive device utilized: Quad cane large base and None  Sit to stand: Complete Independence Stand to sit: Complete Independence Chair to chair: Modified independence Floor:  NT , pt report need for total assist at this time  RAMP:  NT  CURB:  Level of Assistance: SBA Assistive device utilized: Environmental consultant - 4 wheeled Curb Comments: increased time for processing and negotiation  STAIRS:  Level of Assistance: Modified independence  Stair Negotiation Technique: Alternating Pattern  with Bilateral Rails  Number of Stairs: 5   Height of Stairs: 4-6"  Comments:   GAIT: Gait pattern: shuffling, decreased stride Distance walked:  Assistive device utilized: Environmental consultant - 4 wheeled Level of assistance: SBA Comments:   FUNCTIONAL TESTs:  5 times sit to stand: 11 sec Timed up and go (TUG): 15 sec Mini-BEST test: 14/28 Gait Speed: 16.28 sec = 2.01 ft/sec  PATIENT SURVEYS:     PATIENT EDUCATION: Education details: assessment findings Person educated: Patient Education method: Explanation Education comprehension: verbalized understanding and needs further education   HOME EXERCISE PROGRAM: TBD    GOALS: Goals reviewed with patient? Yes  SHORT TERM GOALS: Target date: 03/31/2022  Patient will be able to perform home program independently for self-management. Baseline: Added to HEP 03/29/2022 Goal status: IN PROGRESS  2.  Improve safety with mobility per time 12 sec TUG to reduce risk for falls Baseline: 15 sec w/ 4WW Goal status:GOAL NOT MET-15.78 sec at best   LONG TERM GOALS: Target date: 05/22/2022  Improve safety and reduce risk for falls with mobility per sore 22/28 Mini-BEST test Baseline: 14/28; 18/28 04/10/22 Goal status: IN PROGRESS 04/10/22  2.  Demonstrate increased gait speed to 3 ft/sec to improve efficiency with community ambulation  Baseline: 2 ft/sec; 2.19 ft/sec  04/10/22 Goal status: IN PROGRESS 04/10/22   3.  Demo supervision level for floor to stand transfers to improve functional mobility and facilitate participation in PD-specific exercise class Baseline: NT, pt reports total assist at this time Goal status: MET 04/10/22  4.  Demonstrate coordination and ability to perform throwing tasks to facilitate participation in leisure activities (horseshoes and darts) Baseline: reports unable to perform Goal status: DEFERRED for OT    ASSESSMENT:  CLINICAL IMPRESSION: Skilled PT session today focused on balance, weigthshifting, step length exercises at counter.  Pt continues to report decreased balance and decreased overall strength.  Pt attempts multiple times in session to perform standing balance exercises without UE support, which results in decreased amplitude and power of lower extremity movement.  Pt responds well to cues to use UE support, with improvement noted in balance, step length.  This carries over with use of rollator. He does need cues for resetting upright posture and is fatigued at end of session.  Pt has extensive HEP (in notebook) in addition to HEP noted above.  He continues to benefit from skilled PT towards goals, and he ultimately responds best to external cueing for optimal movement patterns.   OBJECTIVE IMPAIRMENTS Abnormal gait, decreased activity tolerance, decreased balance, decreased coordination, decreased mobility, difficulty walking, decreased ROM, decreased strength, impaired flexibility, improper body mechanics, and postural dysfunction.   ACTIVITY LIMITATIONS carrying, lifting, bending, squatting, transfers, self feeding, and locomotion level  PARTICIPATION LIMITATIONS: cleaning, interpersonal relationship, shopping, community activity, yard work, and leisure activities  [place] Age, Time since onset of injury/illness/exacerbation, and 1 comorbidity: PD, TKR  are also affecting patient's functional outcome.  REHAB POTENTIAL: Good  CLINICAL DECISION MAKING: Evolving/moderate complexity  EVALUATION COMPLEXITY: Moderate  PLAN: PT FREQUENCY: 1x/week  PT DURATION: 6 weeks  PLANNED INTERVENTIONS: Therapeutic exercises, Therapeutic activity, Neuromuscular re-education, Balance training, Gait training, Patient/Family education, Self Care, Joint mobilization, Stair training, Vestibular training, Canalith repositioning, Orthotic/Fit training, DME instructions, Aquatic Therapy, Dry Needling, Electrical stimulation, Cryotherapy, Moist heat, Taping, and Manual therapy  PLAN FOR NEXT SESSION: Next visit need to recert or discharge, so check LTGs  (# of visits in POC is 23 and next visit is 65). (He does already have visits scheduled) Educate wife on cueing strategies for movement patterns if present for session.  Mady Haagensen, PT 05/04/22 7:56 PM Phone: [phone] Fax: [phone]    [facility] [place], [place], [place] [address] Phone # [phone] Fax # [phone]

## 2022-05-04 NOTE — Therapy (Addendum)
OUTPATIENT OCCUPATIONAL THERAPY  Treatment Note  Patient Name: Matthew [name] MRN: [id number] DOB:July 14, 1950, 71 y.o., male Today's Date: 05/04/2022  PCP: Lawerance Cruel, MD REFERRING PROVIDER: Carles Collet Eustace Quail, DO    OT End of Session - 05/04/22 3570     Visit Number 13    Number of Visits 25    Date for OT Re-Evaluation 06/16/22    Authorization Type [employer]    OT Start Time 1405    OT Stop Time 1445    OT Time Calculation (min) 40 min                      Past Medical History:  Diagnosis Date   Anxiety    Arthritis    GERD (gastroesophageal reflux disease)    occ remote history   Hypertension    Parkinson disease    Prostate cancer ([place])    Right knee pain    questionable meniscal tear   Seasonal allergies    Past Surgical History:  Procedure Laterality Date   COLONOSCOPY     Leg Laceration Repair  Left    Age 5   PELVIC LYMPH NODE DISSECTION Bilateral 03/22/2018   Procedure: PELVIC LYMPH NODE DISSECTION;  Surgeon: Ceasar Mons, MD;  Location: [facility];  Service: Urology;  Laterality: Bilateral;   PROSTATE BIOPSY     ROBOT ASSISTED LAPAROSCOPIC RADICAL PROSTATECTOMY N/A 03/22/2018   Procedure: XI ROBOTIC ASSISTED LAPAROSCOPIC PROSTATECTOMY;  Surgeon: Ceasar Mons, MD;  Location: [facility];  Service: Urology;  Laterality: N/A;   TOTAL KNEE ARTHROPLASTY Right 05/05/2019   Procedure: RIGHT TOTAL KNEE ARTHROPLASTY;  Surgeon: Frederik Pear, MD;  Location: [facility];  Service: Orthopedics;  Laterality: Right;   Patient Active Problem List   Diagnosis Date Noted   S/P TKR (total knee replacement), right 05/05/2019   Osteoarthritis of right knee 05/02/2019   Prostate cancer ([place]) 03/22/2018   Malignant neoplasm of prostate ([place]) 02/27/2018    ONSET DATE: referral 03/02/22  REFERRING DIAG: G20.A2 (ICD-10-CM) - Parkinson's disease without dyskinesia, with fluctuating manifestations   THERAPY DIAG:  Other symptoms and  signs involving the nervous system  Muscle weakness (generalized)  Abnormal posture  Other lack of coordination  Unsteadiness on feet  Rationale for Evaluation and Treatment Rehabilitation  SUBJECTIVE:   SUBJECTIVE STATEMENT: Pt reports "same old stuff" Pt accompanied by: self and spouse dropped pt off  PERTINENT HISTORY: anxiety, arthritis, GERD, HTN, R TKR  PRECAUTIONS: Fall  WEIGHT BEARING RESTRICTIONS No  PAIN:  Are you having pain? No  FALLS: Has patient fallen in last 6 months? Yes. Number of falls 2  PLOF: Requires assistive device for independence and Needs assistance with ADLs  PATIENT GOALS to get as close to normal as possible  OBJECTIVE:   HAND DOMINANCE: Right   FUNCTIONAL OUTCOME MEASURES: Physical performance test: PPT #2 (self-feeding):17.66 sec *     PPT #4 (jacket on/off): Attempted but pt unable to complete due to increased lightheadedness in standing and difficulty with previously educated "cape technique"     3 button/unbutton: 2:56.5  COORDINATION: From screen on 03/02/22 9 Hole Peg test: Right: 49.13 sec; Left: 44.53 sec  MUSCLE TONE: RUE: Mild and LUE: Mild  COGNITION: slower processing overall  VISION: Baseline vision: Bifocals and Wears glasses all the time   TODAY'S TREATMENT:  05/04/22 AE: Pt reports difficulty with clipping toe nails, therefore having difficulty with donning socks. Discussed AE for clipping toe nails, pt reports having  some that are angled more like scissors. Simulated LB dressing: engaged in simulated LB dressing with use of gait belt in closed, circular position as well as open to allow for increased focus on leg lifting.  OT providing demonstration and verbal cues for technique to increase success, educating on hemi-technique with donning L then R for increased ease.  Pt continues to demonstrate min-mod difficulty, however improvements when threading LLE first.   Large amplitude: engaged in hip abduction with  seated hip flexion and abduction x10 bilaterally.  OT providing demonstration and cues for hip flexion as needed to carry over to LB dressing.    05/02/22 Self-feeding: 20.85 sec to scoop 5 beans from bowl into container.  Completed additional time with focus on quality of movement, purposeful movements with improvement to 15.88 sec.  Discussed strategies to keep sandwiches together, as pt reports items falling out of sandwich.  Practiced with scooping multiple beans to ensure proper hand placement on utensil and use of knife as buffer to decrease spillage.   9 hole peg test: R: 51.46 sec L: 45.96 sec.  Completed large amplitude hand flicks and thumb opposition.  Pt completed 2nd attempt with R: 1:10 and L: 39.25 sec.  Pt reports feeling that large amplitude exercises sometimes actually slow him down due to onset of fatigue.     04/25/22 Jacket: Massed practice with doffing/donning personal vest. OT provided demonstration, mod verbal cues, and use of mirror for visual feedback to facilitate increased attention to shoulder ROM.  Pt continues to get stuck at head/neck due to decreased shoulder ROM and ability to advance coat around shoulders.  Therapeutic exercise: Engaged in shoulder flexion RUE then LUE with use of mirror for visual feedback for increased shoulder flexion.  Pt demonstrating increased shoulder flexion L > R, therefore continue to encourage pt to don jacket/vest bringing it around R shoulder to allow for increased LUE ROM.  Engaged in reaching overhead and back towards back of head in swooping motion as needed for donning jacket.  Pt continues to demonstrate decreased ability to reach towards back of head, limiting ROM when donning jacket.    PATIENT EDUCATION: Education details: educated on large amplitude movements/shoulder ROM as needed for UB dressing and donning/doffing jacket. Person educated: Patient Education method: Customer service manager Education comprehension:  verbalized understanding   HOME EXERCISE PROGRAM: Bag exercises - see pt instructions    GOALS: Goals reviewed with patient? Yes  SHORT TERM GOALS: Target date: 04/07/22  Pt will be Independent with PD specific HEP Baseline: Goal status: MET - reports completing 2-3x/week  2.  Pt will verbalize understanding of adapted strategies and DME/AE PRN (reacher, rocker knife, scoop plate/plate guard, etc) to maximize safety and independence with ADLs/IADLs. Baseline:  Goal status: IN PROGRESS - ongoing 04/06/22  3.  Pt will demonstrate improved shoulder flexion bilaterally to retreive a lightweight object from overhead shelf with BUE at >/= 130 shoulder flexion. Baseline:  Goal status: NOT MET - Right: 120* and Left: 124*  NEW SHORT TERM GOALS: Target date 05/26/22 Pt will be Independent with progressive HEP for PD. Baseline: Goal status: INITIAL  Pt will demonstrate improved shoulder flexion bilaterally to retreive a lightweight object from overhead shelf with BUE at >/= 130 shoulder flexion. Baseline: R: 120* and L: 124* Goal status: INITIAL   LONG TERM GOALS: Target date: 05/05/22  Pt will demonstrate increased functional ROM and use of BUE to aid in clothing management and hygiene with toileting tasks. Baseline:  Goal  status: NOT MET  2.  Pt will demonstrate improved fine motor coordination for ADLs as evidenced by decreasing 9 hole peg test score for RUE by 3 secs  Baseline: 9 Hole Peg test: Right: 49.13 sec; Left: 44.53 sec Goal status: NOT MET  3.  Pt will demonstrate increased ease with dressing as evidenced by decreasing PPT#4(don/ doff jacket) by 30 seconds  Baseline: time TBA at next session Goal status: NOT MET - requiring ~5 mins to complete  4.  Pt will demonstrate improved ease with feeding as evidenced by decreasing PPT#2 (self feeding) by 3 secs  Baseline: 17.66 sec Goal status: NOT MET - 15.88 sec on 05/02/22  5. Pt will demonstrate and/or report improved  independence with LB dressing with use of AE/DME as needed.  Baseline:  Goal status: NOT MET  NEW LONG TERM GOALS: Target date 06/16/22 Pt will demonstrate increased functional ROM and use of BUE to aid in clothing management and hygiene with toileting tasks. Baseline:  Goal status: INITIAL  Pt will demonstrate improved fine motor coordination for ADLs as evidenced by decreasing 9 hole peg test score for RUE by 3 secs  Baseline: 9 Hole Peg test: Right: 49.13 sec; Left: 44.53 sec Goal status: INITIAL  Pt will demonstrate increased ease with dressing as evidenced by decreasing PPT#4(don/ doff jacket) to < 4 mins Baseline: ~5 mins Goal status: INITIAL  Pt will demonstrate improved ease with feeding as evidenced by decreasing PPT#2 (self feeding) by 3 secs  Baseline: 17.66 sec (progressed to 15.88 sec on 05/02/22) Goal status: INITIAL  Pt will demonstrate and/or report improved independence with LB dressing with use of AE/DME as needed. Baseline: Goal status: INITIAL   ASSESSMENT:  CLINICAL IMPRESSION: Pt continues to demonstrate bradykinesia and decreased amplitude with movements in BUE and BLE impacting ease and independence with ADLs, primarily donning/doffing jacket and pants.  Treatment session with focus on large amplitude movements with hip flexion and abduction as needed for LB dressing.  Pt verbalizes good awareness of education, however with decreased carryover.  Due to forward shoulders and decreased shoulder ROM, pt continues to demonstrate significant difficulty with donning/doffing jacket.  Pt demonstrating mixed results from incorporation of large amplitude exercises due to onset of fatigue with repetition, therefore slowing pt down with coordination and dressing tasks.  Pt will continue to benefit from OT services to facilitate increased ROM and amplitude as needed for ADLs, particularly dressing and self-feeding.  PERFORMANCE DEFICITS in functional skills including ADLs,  IADLs, coordination, tone, ROM, strength, flexibility, FMC, GMC, balance, body mechanics, endurance, decreased knowledge of use of DME, and UE functional use and psychosocial skills including environmental adaptation and routines and behaviors.   IMPAIRMENTS are limiting patient from ADLs and IADLs.   COMORBIDITIES may have co-morbidities  that affects occupational performance. Patient will benefit from skilled OT to address above impairments and improve overall function.  MODIFICATION OR ASSISTANCE TO COMPLETE EVALUATION: Min-Moderate modification of tasks or assist with assess necessary to complete an evaluation.  OT OCCUPATIONAL PROFILE AND HISTORY: Detailed assessment: Review of records and additional review of physical, cognitive, psychosocial history related to current functional performance.  CLINICAL DECISION MAKING: Moderate - several treatment options, min-mod task modification necessary  REHAB POTENTIAL: Good  EVALUATION COMPLEXITY: Moderate    PLAN: OT FREQUENCY: 2x/week  OT DURATION: 6 weeks  PLANNED INTERVENTIONS: self care/ADL training, therapeutic exercise, therapeutic activity, neuromuscular re-education, manual therapy, passive range of motion, balance training, functional mobility training, ultrasound, compression bandaging, moist heat,  cryotherapy, patient/family education, cognitive remediation/compensation, energy conservation, and DME and/or AE instructions  RECOMMENDED OTHER SERVICES: N/A  CONSULTED AND AGREED WITH PLAN OF CARE: Patient  PLAN FOR NEXT SESSION: Review and add to bag exercises for dressing tasks (LB dressing, toileting hygiene, and jacket)- complete in front of mirror for feedback, introduce AE/DME for increased safety/independence with bathing/dressing tasks.  Continue to assess need/recommendation of AE and self-feeding.  Rosalio Loud, OTR/L 05/04/2022, 2:12 PM    OCCUPATIONAL THERAPY DISCHARGE SUMMARY  Visits from Start of Care:  13  Current functional level related to goals / functional outcomes: Unsure as pt has not returned for OT visits since above treatment session. Pt was still being seen by PT and SLP and encouraged to reschedule additional OT visits, however pt never did.  Pt is scheduled for PD screens in Oct 2024.   Remaining deficits: Bradykinesia, decreased amplitude with ADLs and IADLs   Education / Equipment: Bag exercises, large amplitude exercises   Patient agrees to discharge. Patient goals were not met. Patient is being discharged due to not returning since the last visit.  Rosalio Loud, OT 09/22/22

## 2022-05-05 ENCOUNTER — Other Ambulatory Visit: Payer: Self-pay

## 2022-05-05 NOTE — Therapy (Signed)
OUTPATIENT SPEECH LANGUAGE PATHOLOGY PARKINSON'S EVALUATION   Patient Name: Matthew [name] MRN: [id number] DOB:[birth year], 71 y.o., male Today's Date: 05/05/2022  PCP: Lona Kettle, MD REFERRING PROVIDER: Alonza Bogus, DO  END OF SESSION:   Past Medical History:  Diagnosis Date   Anxiety    Arthritis    GERD (gastroesophageal reflux disease)    occ remote history   Hypertension    Parkinson disease    Prostate cancer ([place])    Right knee pain    questionable meniscal tear   Seasonal allergies    Past Surgical History:  Procedure Laterality Date   COLONOSCOPY     Leg Laceration Repair  Left    Age 17   PELVIC LYMPH NODE DISSECTION Bilateral 03/22/2018   Procedure: PELVIC LYMPH NODE DISSECTION;  Surgeon: Ceasar Mons, MD;  [facility]: [facility];  Service: Urology;  Laterality: Bilateral;   PROSTATE BIOPSY     ROBOT ASSISTED LAPAROSCOPIC RADICAL PROSTATECTOMY N/A 03/22/2018   Procedure: XI ROBOTIC ASSISTED LAPAROSCOPIC PROSTATECTOMY;  Surgeon: Ceasar Mons, MD;  Location: [facility];  Service: Urology;  Laterality: N/A;   TOTAL KNEE ARTHROPLASTY Right 05/05/2019   Procedure: RIGHT TOTAL KNEE ARTHROPLASTY;  Surgeon: Frederik Pear, MD;  Location: [facility];  Service: Orthopedics;  Laterality: Right;   Patient Active Problem List   Diagnosis Date Noted   S/P TKR (total knee replacement), right 05/05/2019   Osteoarthritis of right knee 05/02/2019   Prostate cancer ([place]) 03/22/2018   Malignant neoplasm of prostate ([place]) 02/27/2018    ONSET DATE: script dated 03-02-22  REFERRING DIAG: Parkinson's Disease  THERAPY DIAG:  Dysarthria and anarthria  Aphasia  Cognitive communication deficit  Rationale for Evaluation and Treatment: Rehabilitation  SUBJECTIVE:   SUBJECTIVE STATEMENT: "[name] tells me she can't hear me. My talking has gotten worse." Pt accompanied by: significant other  PERTINENT HISTORY: Pt well-known to this SLP from previous courses of  ST. MBS in   PAIN:  Are you having pain? No  FALLS: Has patient fallen in last 6 months?  See PT evaluation for details  LIVING ENVIRONMENT: Lives with: lives with their family Lives in: House/apartment  PLOF:  Level of assistance: Needed assistance with IADLS Employment: Retired  PATIENT GOALS: Improve loudness and fluency  OBJECTIVE:   DIAGNOSTIC FINDINGS:  MBS 12-22-21 Patient presents with functional oropharyngeal swallowing without aspiration of any consistency tested. He does demonstrate minimal asymptomatic laryngeal penetration of thin liquid due to decreased timing of laryngeal closure. Chin tuck posture with use of straw effective to prevent penetration. Pharyngeal swallow is strong without retention. Oral transiting discoordination noted with pt swallowing barium tablet as he required 2nd bolus of thin to transit through oropharynx. Upon esophageal sweep, pt appeared with barium tablet retained in esophagus with pt awareness. Tablet appeared to transit distally after several boluses of liquids. After tablet had transited, pt continued with "phantom" sensation to esophagus. Recommend pt continue diet as tolerated, using chin tuck with liquids if improves comfort, decreases cough. Also encouraged pt to assure maintains strength of voice, cough and expectoration for airway protection.   COGNITION: Overall cognitive status: Impaired Areas of impairment: Following commands and Problem solving (slower processing)   MOTOR SPEECH: Overall motor speech: impaired Level of impairment: Word Respiration: thoracic breathing and clavicular breathing Phonation: breathy and low vocal intensity Resonance: WFL Articulation: Appears intact Intelligibility: Intelligible Effective technique: increased vocal intensity  ORAL MOTOR EXAMINATION: Overall status: Impaired:   Labial: Bilateral (ROM and Coordination) Lingual: Bilateral (ROM and Coordination)  Facial: Bilateral (ROM) Comments:  better ROM with visual cues   OBJECTIVE VOICE ASSESSMENT: Sustained loud "ah" maximum phonation time: 15.7 seconds Sustained loud "ah" loudness average: 86 dB Oral reading (passage) loudness average: 70 dB Oral reading loudness range: 15 dB Conversational loudness average: 64 dB Conversational loudness range: 58-73 dB Voice quality: breathy and low vocal intensity Stimulability trials: Given SLP modeling and rare min cues, loudness average increased to 70dB (range of 63 to 74) at sentence level. Loudness increased to average 68 dB with short conversational segments Comments: [name] stated, "I feel it now" when he was speaking in conversation.  Pt does not report difficulty with swallowing which does not warrant further evaluation.  PATIENT REPORTED OUTCOME MEASURES (PROM): Communication Effectiveness Survey: to be completed in first 2 sessions  TODAY'S TREATMENT:                                                                                                                                         DATE:  05/04/22: SLP explained eval results to pt and wife. SLP reiterated pt must practice loud /a/ and everyday sentences as well as homework with speech hierarchy as directed.    PATIENT EDUCATION: Education details: see "today's treatment" Person educated: Patient and Spouse Education method: Explanation and Demonstration Education comprehension: verbalized understanding  HOME EXERCISE PROGRAM: Loud /a/ and everyday sentences.    GOALS: Goals reviewed with patient? No  SHORT TERM GOALS: Target date: 06/09/22  Produce /a/ with average upper 80s dB over three sessions Baseline: Goal status: INITIAL  2.  pt will demo abdominal breathing with sentence responses 85% accuracy x 3 sessions  Baseline:  Goal status: INITIAL  3.  pt will generate 5 minutes simple conversation with low 70s dB average in 3 sessions  Baseline:  Goal status: INITIAL  4.  Pt will use strategies for  dysfluency in 1-2 minute short conversational segments  Baseline:  Goal status: INITIAL   LONG TERM GOALS: Target date: 07/19/21  pt will produce average upper 60s dB in 10 minutes simple to mod complex conversation in 5 sessions  Baseline:  Goal status: INITIAL  2.  pt will maintain upper 60s dB average in 10 minute simple conversation x 3 sessions  Baseline:  Goal status: INITIAL  3.  pt will use abdominal breathing >80% of the time during 10 minute simple conversation in 3 sessions  Baseline:  Goal status: INITIAL  4.  pt will report fewer requests to repeat himself than prior to ST in 3 sessions  Baseline:  Goal status: INITIAL  5.  Pt will use strategies for dysfluency in 5 minute conversational segments Baseline:  Goal status: INITIAL  6.  Pt will score higher/better on PROM in the last 2 weeks of therapy Baseline:  Goal status: INITIAL  ASSESSMENT:  CLINICAL IMPRESSION: Patient is a 71 y.o. male who was seen today for assessment  of dysarthria and communcation deficits in light of Parkinsons Disease. Pt tells SLP that family members ask him to repeat frequently, and he has stopped communicating in social situations due to difficulty being louder when necessary (dart throwing group). Pt was able to improve loudness in simple question asking by SLP and thus is an excellent candidate for ST.    OBJECTIVE IMPAIRMENTS: Objective impairments include executive functioning, aphasia, and dysarthria. These impairments are limiting patient from household responsibilities, ADLs/IADLs, and effectively communicating at home and in community.Factors affecting potential to achieve goals and functional outcome are  none .Marland Kitchen Patient will benefit from skilled SLP services to address above impairments and improve overall function.  REHAB POTENTIAL: Good  PLAN:  SLP FREQUENCY: 2x/week  SLP DURATION: 8 weeks  PLANNED INTERVENTIONS: Environmental controls, Cueing hierachy, Cognitive  reorganization, Internal/external aids, Oral motor exercises, Functional tasks, Multimodal communication approach, SLP instruction and feedback, Compensatory strategies, and Patient/family education    [facility], [place] 05/05/2022, 8:22 AM

## 2022-05-09 ENCOUNTER — Ambulatory Visit: Payer: PPO

## 2022-05-09 DIAGNOSIS — R471 Dysarthria and anarthria: Secondary | ICD-10-CM

## 2022-05-09 DIAGNOSIS — R293 Abnormal posture: Secondary | ICD-10-CM

## 2022-05-09 DIAGNOSIS — R2689 Other abnormalities of gait and mobility: Secondary | ICD-10-CM

## 2022-05-09 DIAGNOSIS — M6281 Muscle weakness (generalized): Secondary | ICD-10-CM

## 2022-05-09 DIAGNOSIS — R2681 Unsteadiness on feet: Secondary | ICD-10-CM

## 2022-05-09 DIAGNOSIS — R41841 Cognitive communication deficit: Secondary | ICD-10-CM

## 2022-05-09 DIAGNOSIS — G20A2 Parkinson's disease without dyskinesia, with fluctuations: Secondary | ICD-10-CM

## 2022-05-09 DIAGNOSIS — R4701 Aphasia: Secondary | ICD-10-CM

## 2022-05-09 DIAGNOSIS — R29818 Other symptoms and signs involving the nervous system: Secondary | ICD-10-CM

## 2022-05-09 NOTE — Therapy (Signed)
OUTPATIENT SPEECH LANGUAGE PATHOLOGY PARKINSON'S EVALUATION   Patient Name: Matthew [name] MRN: [id number] DOB:07/15/1950, 71 y.o., male Today's Date: 05/09/2022  PCP: Lona Kettle, MD REFERRING PROVIDER: Alonza Bogus, DO  END OF SESSION:  End of Session - 05/09/22 1719     Visit Number 2    Number of Visits 17    Date for SLP Re-Evaluation 07/19/22    SLP Start Time 48    SLP Stop Time  7829    SLP Time Calculation (min) 41 min    Activity Tolerance Patient tolerated treatment well             Past Medical History:  Diagnosis Date   Anxiety    Arthritis    GERD (gastroesophageal reflux disease)    occ remote history   Hypertension    Parkinson disease    Prostate cancer ([place])    Right knee pain    questionable meniscal tear   Seasonal allergies    Past Surgical History:  Procedure Laterality Date   COLONOSCOPY     Leg Laceration Repair  Left    Age 54   PELVIC LYMPH NODE DISSECTION Bilateral 03/22/2018   Procedure: PELVIC LYMPH NODE DISSECTION;  Surgeon: Ceasar Mons, MD;  Location: [facility];  Service: Urology;  Laterality: Bilateral;   PROSTATE BIOPSY     ROBOT ASSISTED LAPAROSCOPIC RADICAL PROSTATECTOMY N/A 03/22/2018   Procedure: XI ROBOTIC ASSISTED LAPAROSCOPIC PROSTATECTOMY;  Surgeon: Ceasar Mons, MD;  Location: [facility];  Service: Urology;  Laterality: N/A;   TOTAL KNEE ARTHROPLASTY Right 05/05/2019   Procedure: RIGHT TOTAL KNEE ARTHROPLASTY;  Surgeon: Frederik Pear, MD;  Location: [facility];  Service: Orthopedics;  Laterality: Right;   Patient Active Problem List   Diagnosis Date Noted   S/P TKR (total knee replacement), right 05/05/2019   Osteoarthritis of right knee 05/02/2019   Prostate cancer ([place]) 03/22/2018   Malignant neoplasm of prostate ([place]) 02/27/2018    ONSET DATE: script dated 03-02-22  REFERRING DIAG: Parkinson's Disease  THERAPY DIAG:  Dysarthria and anarthria  Aphasia  Cognitive communication  deficit  Rationale for Evaluation and Treatment: Rehabilitation  SUBJECTIVE:   SUBJECTIVE STATEMENT: "It feels like when I come up here my voice gets quiet." Pt accompanied by: significant other  PERTINENT HISTORY: Pt well-known to this SLP from previous courses of [place]. MBS in   PAIN:  Are you having pain? No  FALLS: Has patient fallen in last 6 months?  See PT evaluation for details  LIVING ENVIRONMENT: Lives with: lives with their family Lives in: House/apartment  PLOF:  Level of assistance: Needed assistance with IADLS Employment: Retired  PATIENT GOALS: Improve loudness and fluency  OBJECTIVE:   DIAGNOSTIC FINDINGS:  MBS 12-22-21 Patient presents with functional oropharyngeal swallowing without aspiration of any consistency tested. He does demonstrate minimal asymptomatic laryngeal penetration of thin liquid due to decreased timing of laryngeal closure. Chin tuck posture with use of straw effective to prevent penetration. Pharyngeal swallow is strong without retention. Oral transiting discoordination noted with pt swallowing barium tablet as he required 2nd bolus of thin to transit through oropharynx. Upon esophageal sweep, pt appeared with barium tablet retained in esophagus with pt awareness. Tablet appeared to transit distally after several boluses of liquids. After tablet had transited, pt continued with "phantom" sensation to esophagus. Recommend pt continue diet as tolerated, using chin tuck with liquids if improves comfort, decreases cough. Also encouraged pt to assure maintains strength of voice, cough and expectoration for airway protection.   Marland Kitchen  PATIENT REPORTED OUTCOME MEASURES (PROM): Communication Effectiveness Survey: to be completed in first 2 sessions  TODAY'S TREATMENT:                                                                                                                                         DATE:  05/09/22: Pt's loud /a/ average upper 80s dB with  rare min A, everyday sentences averaged upper 70s dB with occasional min A. With occasional min-mod A, pt spoke for 15 minutes and measured at low-mid 60s dB; after 10 minutes pt's voice decr'd to low 60s dB. SLP helped pt modify his everyday sentences to make them more current. SLP strongly encouraged pt to use loud /a/ and everyday sentences BID at home.   05/04/22: SLP explained eval results to pt and wife. SLP reiterated pt must practice loud /a/ and everyday sentences as well as homework with speech hierarchy as directed.    PATIENT EDUCATION: Education details: see "today's treatment" Person educated: Patient and Spouse Education method: Explanation and Demonstration Education comprehension: verbalized understanding  HOME EXERCISE PROGRAM: Loud /a/ and everyday sentences.    GOALS: Goals reviewed with patient? No  SHORT TERM GOALS: Target date: 06/09/22  Produce /a/ with average upper 80s dB over three sessions Baseline: Goal status: Ongoing  2.  pt will demo abdominal breathing with sentence responses 85% accuracy x 3 sessions  Baseline:  Goal status: Ongoing  3.  pt will generate 5 minutes simple conversation with low 70s dB average in 3 sessions  Baseline:  Goal status: Ongoing  4.  Pt will use strategies for dysfluency in 1-2 minute short conversational segments  Baseline:  Goal status: Ongoing   LONG TERM GOALS: Target date: 07/19/21  pt will produce average upper 60s dB in 10 minutes simple to mod complex conversation in 5 sessions  Baseline:  Goal status: Ongoing  2.  pt will maintain upper 60s dB average in 10 minute simple conversation x 3 sessions  Baseline:  Goal status: Ongoing  3.  pt will use abdominal breathing >80% of the time during 10 minute simple conversation in 3 sessions  Baseline:  Goal status: Ongoing  4.  pt will report fewer requests to repeat himself than prior to ST in 3 sessions  Baseline:  Goal status: Ongoing  5.  Pt will use  strategies for dysfluency in 5 minute conversational segments Baseline:  Goal status: Ongoing  6.  Pt will score higher/better on PROM in the last 2 weeks of therapy Baseline:  Goal status: Ongoing  ASSESSMENT:  CLINICAL IMPRESSION: Patient is a 71 y.o. male who was seen today for treatment of dysarthria and communcation deficits in light of Parkinsons Disease. Pt tells SLP that family members ask him to repeat frequently, and he has stopped communicating in social situations due to difficulty being louder when necessary (dart throwing group). .    OBJECTIVE  IMPAIRMENTS: Objective impairments include executive functioning, aphasia, and dysarthria. These impairments are limiting patient from household responsibilities, ADLs/IADLs, and effectively communicating at home and in community.Factors affecting potential to achieve goals and functional outcome are  none .Marland Kitchen Patient will benefit from skilled SLP services to address above impairments and improve overall function.  REHAB POTENTIAL: Good  PLAN:  SLP FREQUENCY: 2x/week  SLP DURATION: 8 weeks  PLANNED INTERVENTIONS: Environmental controls, Cueing hierachy, Cognitive reorganization, Internal/external aids, Oral motor exercises, Functional tasks, Multimodal communication approach, SLP instruction and feedback, Compensatory strategies, and Patient/family education    [facility], [place] 05/09/2022, 5:19 PM

## 2022-05-09 NOTE — Therapy (Signed)
OUTPATIENT PHYSICAL THERAPY NEURO TREATMENT, Progress Note, and Recertification   Patient Name: Matthew [name] MRN: [id number] DOB:[birth year], 71 y.o., male Today's Date: 03/09/2022   PCP: Lawerance Cruel, MD REFERRING PROVIDER: Ludwig Clarks, DO   Progress Note Reporting Period 04/10/22 to 05/09/22  See note below for Objective Data and Assessment of Progress/Goals.       PT End of Session - 05/09/22 1456     Visit Number 16    Number of Visits 22    Date for PT Re-Evaluation 06/20/22    Authorization Type [employer]    Progress Note Due on Visit 26    PT Start Time 1450    PT Stop Time 1530    PT Time Calculation (min) 40 min    Equipment Utilized During Treatment Gait belt    Activity Tolerance Patient tolerated treatment well    Behavior During Therapy WFL for tasks assessed/performed                   Past Medical History:  Diagnosis Date   Anxiety    Arthritis    GERD (gastroesophageal reflux disease)    occ remote history   Hypertension    Parkinson disease    Prostate cancer ([place])    Right knee pain    questionable meniscal tear   Seasonal allergies    Past Surgical History:  Procedure Laterality Date   COLONOSCOPY     Leg Laceration Repair  Left    Age 60   PELVIC LYMPH NODE DISSECTION Bilateral 03/22/2018   Procedure: PELVIC LYMPH NODE DISSECTION;  Surgeon: Ceasar Mons, MD;  [facility]: [facility];  Service: Urology;  Laterality: Bilateral;   PROSTATE BIOPSY     ROBOT ASSISTED LAPAROSCOPIC RADICAL PROSTATECTOMY N/A 03/22/2018   Procedure: XI ROBOTIC ASSISTED LAPAROSCOPIC PROSTATECTOMY;  Surgeon: Ceasar Mons, MD;  [facility];  Service: Urology;  Laterality: N/A;   TOTAL KNEE ARTHROPLASTY Right 05/05/2019   Procedure: RIGHT TOTAL KNEE ARTHROPLASTY;  Surgeon: Frederik Pear, MD;  [facility];  Service: Orthopedics;  Laterality: Right;   Patient Active Problem List   Diagnosis Date Noted   S/P  TKR (total knee replacement), right 05/05/2019   Osteoarthritis of right knee 05/02/2019   Prostate cancer ([place]) 03/22/2018   Malignant neoplasm of prostate ([place]) 02/27/2018    ONSET DATE: 2-3 years  REFERRING DIAG: G20.A2 (ICD-10-CM) - Parkinson's disease without dyskinesia, with fluctuating manifestations   THERAPY DIAG:  Other symptoms and signs involving the nervous system  Muscle weakness (generalized)  Abnormal posture  Unsteadiness on feet  Other abnormalities of gait and mobility  Parkinson's disease without dyskinesia, with fluctuating manifestations  Rationale for Evaluation and Treatment Rehabilitation  SUBJECTIVE:  SUBJECTIVE STATEMENT: Continuing to have balance issues  Pt accompanied by: self  PERTINENT HISTORY: Parkinson's, right TKR,   PAIN:  Are you having pain? No  PRECAUTIONS: None  WEIGHT BEARING RESTRICTIONS No  FALLS: Has patient fallen in last 6 months? Yes. Number of falls 2-3 , unable to perform fall recovery  LIVING ENVIRONMENT: Lives with: lives with their spouse Lives in: House/apartment Stairs: Yes: Internal: 16 steps; can reach both and External: 2-3 steps; on left going up Has following equipment at home: Walker - 4 wheeled  PLOF: Independent with household mobility with device  PATIENT GOALS "get close to normal as possible"  -Be able to walk without fear of falling -Be able to throw horseshoes/darts -Be able to maintain walking program with trekking poles   OBJECTIVE:   TODAY'S TREATMENT: 05/09/22 Activity Comments  STG/LTG completed   Review of POC details   Pt education regarding continued POC               TODAY'S TREATMENT: 05/04/2022 Activity Comments  Sit<>stand 5 reps   Heel/toe raises 2 x 10 reps   Stagger stance  forward/back weightshift 10 reps Cues for deliberate foot placement  Lateral weightshift>shift and lift for increased SLS>added single UE lift For progression of SLS, cues for tall reset between sets  Forward>back step and weigthshift Already in HEP, cues to make sure to do this regularly  Squat to up on toes UE support  March in place, 1 UE support x 10 reps Cues for increased height of marching  Standing wide BOS:  head turns x 5, head nods x 5; step and turn, clock turning 360 degrees With UE support    PATIENT EDUCATION: Education details: in pt's HEP notebook-emphasized forward/back stepping as regular exercise.  Discussed/reviewed that for optimal power of lower extremities (with exercises, with exercise classes), he needs UE support  Person educated: Patient Education method: Explanation and Demonstration Education comprehension: verbalized understanding and returned demonstration    HOME EXERCISE PROGRAM Last updated: 04/04/22 Access Code: [email or web address] URL: [email or web address] Date: 04/04/2022 Prepared by: [facility]  Exercises - Goblet Squat with Kettlebell  - 1 x daily - 7 x weekly - 3 sets - 10 reps - Sumo Squat with Dumbbell  - 1 x daily - 7 x weekly - 3 sets - 10 reps - Step Taps on High Step  - 1 x daily - 5 x weekly - 3 sets - 10 reps - Forward and Backward Step Over with Counter Support  - 1 x daily - 5 x weekly - 3 sets - 10 reps    Below measures were taken at time of initial evaluation unless otherwise specified:   DIAGNOSTIC FINDINGS: none recently  COGNITION: Overall cognitive status: Within functional limits for tasks assessed   SENSATION: WFL  COORDINATION: Grossly intact with some deficits in rapid, alternating and divided movement tasks  EDEMA:    MUSCLE TONE: some rigidity noted in BLE   MUSCLE LENGTH: Knee flexion contractures  DTRs:    POSTURE: forward head  LOWER EXTREMITY ROM:      Active  Right Eval Left Eval  Hip flexion    Hip extension    Hip abduction    Hip adduction    Hip internal rotation    Hip external rotation    Knee flexion    Knee extension -10 -10  Ankle dorsiflexion 5 5  Ankle plantarflexion    Ankle inversion  Ankle eversion     (Blank rows = not tested)  LOWER EXTREMITY MMT:    Grossly 4/5 BLE  BED MOBILITY:  NT  TRANSFERS: Assistive device utilized: Quad cane large base and None  Sit to stand: Complete Independence Stand to sit: Complete Independence Chair to chair: Modified independence Floor:  NT , pt report need for total assist at this time  RAMP:  NT  CURB:  Level of Assistance: SBA Assistive device utilized: Environmental consultant - 4 wheeled Curb Comments: increased time for processing and negotiation  STAIRS:  Level of Assistance: Modified independence  Stair Negotiation Technique: Alternating Pattern  with Bilateral Rails  Number of Stairs: 5   Height of Stairs: 4-6"  Comments:   GAIT: Gait pattern: shuffling, decreased stride Distance walked:  Assistive device utilized: Environmental consultant - 4 wheeled Level of assistance: SBA Comments:   FUNCTIONAL TESTs:  5 times sit to stand: 11 sec Timed up and go (TUG): 15 sec Mini-BEST test: 14/28 Gait Speed: 16.28 sec = 2.01 ft/sec  PATIENT SURVEYS:     PATIENT EDUCATION: Education details: assessment findings Person educated: Patient Education method: Explanation Education comprehension: verbalized understanding and needs further education   HOME EXERCISE PROGRAM: TBD    GOALS: Goals reviewed with patient? Yes  SHORT TERM GOALS: Target date: 03/31/2022  Patient will be able to perform home program independently for self-management. Baseline: Added to HEP 03/29/2022 Goal status: IN PROGRESS  2.  Improve safety with mobility per time 12 sec TUG to reduce risk for falls Baseline: 15 sec w/ 4WW Goal status:GOAL NOT MET-15.78 sec at best   LONG TERM GOALS: Target  date: 06/20/2022  Improve safety and reduce risk for falls with mobility per sore 22/28 Mini-BEST test Baseline: 14/28; 18/28 04/10/22; (05/09/22) 20/28 Goal status: IN PROGRESS   2.  Demonstrate increased gait speed to 3 ft/sec to improve efficiency with community ambulation  Baseline: 2 ft/sec; 2.19 ft/sec 04/10/22; (05/09/22) 2.10 ft/sec Goal status: IN PROGRESS 04/10/22   3.  Demo supervision level for floor to stand transfers to improve functional mobility and facilitate participation in PD-specific exercise class Baseline: NT, pt reports total assist at this time Goal status: MET 04/10/22  4.  Demonstrate coordination and ability to perform throwing tasks to facilitate participation in leisure activities (horseshoes and darts) Baseline: reports unable to perform Goal status: DEFERRED for OT    ASSESSMENT:  CLINICAL IMPRESSION: Completion of progress note and recertification and performance of STG/LTG measures.  Pt able to improve score of Mini-BEST test from previous 18 to 20/24 points, but still not achieving requisite goal.  Recommend continued services at this time to meet POC details, but patient is unsure if he wishes to continue and was ambivalent.  I discussed pro's and con's and expressed that he should continue to pursue community-based exercise maintenance and continue PT to address more high level balance. Pt will contact to schedule additional visits if that is his inclination.    OBJECTIVE IMPAIRMENTS Abnormal gait, decreased activity tolerance, decreased balance, decreased coordination, decreased mobility, difficulty walking, decreased ROM, decreased strength, impaired flexibility, improper body mechanics, and postural dysfunction.   ACTIVITY LIMITATIONS carrying, lifting, bending, squatting, transfers, self feeding, and locomotion level  PARTICIPATION LIMITATIONS: cleaning, interpersonal relationship, shopping, community activity, yard work, and leisure  activities  Rosalia Age, Time since onset of injury/illness/exacerbation, and 1 comorbidity: PD, TKR  are also affecting patient's functional outcome.   REHAB POTENTIAL: Good  CLINICAL DECISION MAKING: Evolving/moderate complexity  EVALUATION COMPLEXITY:  Moderate  PLAN: PT FREQUENCY: 1x/week  PT DURATION: 6 weeks  PLANNED INTERVENTIONS: Therapeutic exercises, Therapeutic activity, Neuromuscular re-education, Balance training, Gait training, Patient/Family education, Self Care, Joint mobilization, Stair training, Vestibular training, Canalith repositioning, Orthotic/Fit training, DME instructions, Aquatic Therapy, Dry Needling, Electrical stimulation, Cryotherapy, Moist heat, Taping, and Manual therapy  PLAN FOR NEXT SESSION: Push-release balance, higher level balance activities, strength training  4:35 PM, 05/09/22 M. Sherlyn Lees, PT, DPT Physical Therapist- [place] Office Number: [phone]     [place] at [facility] [address] [place], [place] [address] Phone # [phone] Fax # [phone]

## 2022-05-16 ENCOUNTER — Ambulatory Visit: Payer: PPO

## 2022-05-16 DIAGNOSIS — R2681 Unsteadiness on feet: Secondary | ICD-10-CM

## 2022-05-16 DIAGNOSIS — R2689 Other abnormalities of gait and mobility: Secondary | ICD-10-CM

## 2022-05-16 DIAGNOSIS — M6281 Muscle weakness (generalized): Secondary | ICD-10-CM

## 2022-05-16 DIAGNOSIS — R29818 Other symptoms and signs involving the nervous system: Secondary | ICD-10-CM | POA: Diagnosis not present

## 2022-05-16 DIAGNOSIS — R41841 Cognitive communication deficit: Secondary | ICD-10-CM

## 2022-05-16 DIAGNOSIS — G20A2 Parkinson's disease without dyskinesia, with fluctuations: Secondary | ICD-10-CM

## 2022-05-16 DIAGNOSIS — R293 Abnormal posture: Secondary | ICD-10-CM

## 2022-05-16 DIAGNOSIS — R471 Dysarthria and anarthria: Secondary | ICD-10-CM

## 2022-05-16 DIAGNOSIS — R4701 Aphasia: Secondary | ICD-10-CM

## 2022-05-16 NOTE — Therapy (Signed)
OUTPATIENT PHYSICAL THERAPY NEURO TREATMENT   Patient Name: Matthew [name] MRN: [id number] DOB:1951/05/04, 71 y.o., male Today's Date: 03/09/2022   PCP: Lawerance Cruel, MD REFERRING PROVIDER: Carles Collet Eustace Quail, DO     PT End of Session - 05/16/22 1450     Visit Number 17    Number of Visits 22    Date for PT Re-Evaluation 06/20/22    Authorization Type [employer]    Progress Note Due on Visit 26    PT Start Time 1448    PT Stop Time 1530    PT Time Calculation (min) 42 min    Equipment Utilized During Treatment Gait belt    Activity Tolerance Patient tolerated treatment well    Behavior During Therapy WFL for tasks assessed/performed                   Past Medical History:  Diagnosis Date   Anxiety    Arthritis    GERD (gastroesophageal reflux disease)    occ remote history   Hypertension    Parkinson disease    Prostate cancer ([place])    Right knee pain    questionable meniscal tear   Seasonal allergies    Past Surgical History:  Procedure Laterality Date   COLONOSCOPY     Leg Laceration Repair  Left    Age 30   PELVIC LYMPH NODE DISSECTION Bilateral 03/22/2018   Procedure: PELVIC LYMPH NODE DISSECTION;  Surgeon: Ceasar Mons, MD;  Location: [facility];  Service: Urology;  Laterality: Bilateral;   PROSTATE BIOPSY     ROBOT ASSISTED LAPAROSCOPIC RADICAL PROSTATECTOMY N/A 03/22/2018   Procedure: XI ROBOTIC ASSISTED LAPAROSCOPIC PROSTATECTOMY;  Surgeon: Ceasar Mons, MD;  Location: [facility];  Service: Urology;  Laterality: N/A;   TOTAL KNEE ARTHROPLASTY Right 05/05/2019   Procedure: RIGHT TOTAL KNEE ARTHROPLASTY;  Surgeon: Frederik Pear, MD;  Location: [facility];  Service: Orthopedics;  Laterality: Right;   Patient Active Problem List   Diagnosis Date Noted   S/P TKR (total knee replacement), right 05/05/2019   Osteoarthritis of right knee 05/02/2019   Prostate cancer ([place]) 03/22/2018   Malignant neoplasm of prostate ([place])  02/27/2018    ONSET DATE: 2-3 years  REFERRING DIAG: G20.A2 (ICD-10-CM) - Parkinson's disease without dyskinesia, with fluctuating manifestations   THERAPY DIAG:  Muscle weakness (generalized)  Abnormal posture  Unsteadiness on feet  Other abnormalities of gait and mobility  Parkinson's disease without dyskinesia, with fluctuating manifestations  Rationale for Evaluation and Treatment Rehabilitation  SUBJECTIVE:  SUBJECTIVE STATEMENT: Not feeling too bad  Pt accompanied by: self  PERTINENT HISTORY: Parkinson's, right TKR,   PAIN:  Are you having pain? No  PRECAUTIONS: None  WEIGHT BEARING RESTRICTIONS No  FALLS: Has patient fallen in last 6 months? Yes. Number of falls 2-3 , unable to perform fall recovery  LIVING ENVIRONMENT: Lives with: lives with their spouse Lives in: House/apartment Stairs: Yes: Internal: 16 steps; can reach both and External: 2-3 steps; on left going up Has following equipment at home: Walker - 4 wheeled  PLOF: Independent with household mobility with device  PATIENT GOALS "get close to normal as possible"  -Be able to walk without fear of falling -Be able to throw horseshoes/darts -Be able to maintain walking program with trekking poles   OBJECTIVE:   TODAY'S TREATMENT: 05/16/22 Activity Comments  NU-step resistance intervals to improve activity tolerance Level 5, 2 min warm-up then 1:1 heavy:light  Resistance training -STS to march (15# KB, 3# ankle weights) 10x -LAQ 3x12 3# -hamstring curls 2x10 3# (standing) -march (standing) 2x10 3#  balance -cone taps: wide/midline taps 1x5 3# ankle weights -retrowalking 3# -sidestepping 3#  Partial long sit hamstring stretch 2x60 sec -bilateral knee flexion contracture           TODAY'S TREATMENT:  05/09/22 Activity Comments  STG/LTG completed   Review of POC details   Pt education regarding continued POC               TODAY'S TREATMENT: 05/04/2022 Activity Comments  Sit<>stand 5 reps   Heel/toe raises 2 x 10 reps   Stagger stance forward/back weightshift 10 reps Cues for deliberate foot placement  Lateral weightshift>shift and lift for increased SLS>added single UE lift For progression of SLS, cues for tall reset between sets  Forward>back step and weigthshift Already in HEP, cues to make sure to do this regularly  Squat to up on toes UE support  March in place, 1 UE support x 10 reps Cues for increased height of marching  Standing wide BOS:  head turns x 5, head nods x 5; step and turn, clock turning 360 degrees With UE support    PATIENT EDUCATION: Education details: in pt's HEP notebook-emphasized forward/back stepping as regular exercise.  Discussed/reviewed that for optimal power of lower extremities (with exercises, with exercise classes), he needs UE support  Person educated: Patient Education method: Explanation and Demonstration Education comprehension: verbalized understanding and returned demonstration    HOME EXERCISE PROGRAM Last updated: 04/04/22 Access Code: [email or web address] URL: [email or web address] Date: 04/04/2022 Prepared by: Noxon with Kettlebell  - 1 x daily - 7 x weekly - 3 sets - 10 reps - Sumo Squat with Dumbbell  - 1 x daily - 7 x weekly - 3 sets - 10 reps - Step Taps on High Step  - 1 x daily - 5 x weekly - 3 sets - 10 reps - Forward and Backward Step Over with Counter Support  - 1 x daily - 5 x weekly - 3 sets - 10 reps    Below measures were taken at time of initial evaluation unless otherwise specified:   DIAGNOSTIC FINDINGS: none recently  COGNITION: Overall cognitive status: Within functional limits for tasks  assessed   SENSATION: WFL  COORDINATION: Grossly intact with some deficits in rapid, alternating and divided movement tasks  EDEMA:    MUSCLE TONE: some rigidity noted in BLE   MUSCLE LENGTH: Knee flexion  contractures  DTRs:    POSTURE: forward head  LOWER EXTREMITY ROM:     Active  Right Eval Left Eval  Hip flexion    Hip extension    Hip abduction    Hip adduction    Hip internal rotation    Hip external rotation    Knee flexion    Knee extension -10 -10  Ankle dorsiflexion 5 5  Ankle plantarflexion    Ankle inversion    Ankle eversion     (Blank rows = not tested)  LOWER EXTREMITY MMT:    Grossly 4/5 BLE  BED MOBILITY:  NT  TRANSFERS: Assistive device utilized: Quad cane large base and None  Sit to stand: Complete Independence Stand to sit: Complete Independence Chair to chair: Modified independence Floor:  NT , pt report need for total assist at this time  RAMP:  NT  CURB:  Level of Assistance: SBA Assistive device utilized: Environmental consultant - 4 wheeled Curb Comments: increased time for processing and negotiation  STAIRS:  Level of Assistance: Modified Artist Technique: Alternating Pattern  with Bilateral Rails  Number of Stairs: 5   Height of Stairs: 4-6"  Comments:   GAIT: Gait pattern: shuffling, decreased stride Distance walked:  Assistive device utilized: Environmental consultant - 4 wheeled Level of assistance: SBA Comments:   FUNCTIONAL TESTs:  5 times sit to stand: 11 sec Timed up and go (TUG): 15 sec Mini-BEST test: 14/28 Gait Speed: 16.28 sec = 2.01 ft/sec  PATIENT SURVEYS:     PATIENT EDUCATION: Education details: assessment findings Person educated: Patient Education method: Explanation Education comprehension: verbalized understanding and needs further education   HOME EXERCISE PROGRAM: TBD    GOALS: Goals reviewed with patient? Yes  SHORT TERM GOALS: Target date: 03/31/2022  Patient will be able to  perform home program independently for self-management. Baseline: Added to HEP 03/29/2022 Goal status: IN PROGRESS  2.  Improve safety with mobility per time 12 sec TUG to reduce risk for falls Baseline: 15 sec w/ 4WW Goal status:GOAL NOT MET-15.78 sec at best   LONG TERM GOALS: Target date: 06/20/2022  Improve safety and reduce risk for falls with mobility per sore 22/28 Mini-BEST test Baseline: 14/28; 18/28 04/10/22; (05/09/22) 20/28 Goal status: IN PROGRESS   2.  Demonstrate increased gait speed to 3 ft/sec to improve efficiency with community ambulation  Baseline: 2 ft/sec; 2.19 ft/sec 04/10/22; (05/09/22) 2.10 ft/sec Goal status: IN PROGRESS 04/10/22   3.  Demo supervision level for floor to stand transfers to improve functional mobility and facilitate participation in PD-specific exercise class Baseline: NT, pt reports total assist at this time Goal status: MET 04/10/22  4.  Demonstrate coordination and ability to perform throwing tasks to facilitate participation in leisure activities (horseshoes and darts) Baseline: reports unable to perform Goal status: DEFERRED for OT    ASSESSMENT:  CLINICAL IMPRESSION: Focus on strength and conditioning to improve activity tolerance. Compound and isolated movements to improve coordination/control   OBJECTIVE IMPAIRMENTS Abnormal gait, decreased activity tolerance, decreased balance, decreased coordination, decreased mobility, difficulty walking, decreased ROM, decreased strength, impaired flexibility, improper body mechanics, and postural dysfunction.   ACTIVITY LIMITATIONS carrying, lifting, bending, squatting, transfers, self feeding, and locomotion level  PARTICIPATION LIMITATIONS: cleaning, interpersonal relationship, shopping, community activity, yard work, and leisure activities  [place] Age, Time since onset of injury/illness/exacerbation, and 1 comorbidity: PD, TKR  are also affecting patient's functional outcome.    REHAB POTENTIAL: Good  CLINICAL DECISION MAKING: Evolving/moderate complexity  EVALUATION COMPLEXITY: Moderate  PLAN: PT FREQUENCY: 1x/week  PT DURATION: 6 weeks  PLANNED INTERVENTIONS: Therapeutic exercises, Therapeutic activity, Neuromuscular re-education, Balance training, Gait training, Patient/Family education, Self Care, Joint mobilization, Stair training, Vestibular training, Canalith repositioning, Orthotic/Fit training, DME instructions, Aquatic Therapy, Dry Needling, Electrical stimulation, Cryotherapy, Moist heat, Taping, and Manual therapy  PLAN FOR NEXT SESSION: Push-release balance, higher level balance activities, strength training  2:50 PM, 05/16/22 M. Sherlyn Lees, PT, DPT Physical Therapist- [place] Office Number: [phone]     [facility] [address] [place], [place] [address] Phone # [phone] Fax # [phone]

## 2022-05-16 NOTE — Therapy (Signed)
OUTPATIENT SPEECH LANGUAGE PATHOLOGY PARKINSON'S TREATMENT   Patient Name: Matthew [name] MRN: [id number] DOB:01-Dec-1950, 71 y.o., male Today's Date: 05/16/2022  PCP: Lona Kettle, MD REFERRING PROVIDER: Alonza Bogus, DO  END OF SESSION:  End of Session - 05/16/22 2301     Visit Number 3    Number of Visits 17    Date for SLP Re-Evaluation 07/19/22    SLP Start Time 1535    SLP Stop Time  1616    SLP Time Calculation (min) 41 min    Activity Tolerance Patient tolerated treatment well              Past Medical History:  Diagnosis Date   Anxiety    Arthritis    GERD (gastroesophageal reflux disease)    occ remote history   Hypertension    Parkinson disease    Prostate cancer ([place])    Right knee pain    questionable meniscal tear   Seasonal allergies    Past Surgical History:  Procedure Laterality Date   COLONOSCOPY     Leg Laceration Repair  Left    Age 40   PELVIC LYMPH NODE DISSECTION Bilateral 03/22/2018   Procedure: PELVIC LYMPH NODE DISSECTION;  Surgeon: Ceasar Mons, MD;  Location: [facility];  Service: Urology;  Laterality: Bilateral;   PROSTATE BIOPSY     ROBOT ASSISTED LAPAROSCOPIC RADICAL PROSTATECTOMY N/A 03/22/2018   Procedure: XI ROBOTIC ASSISTED LAPAROSCOPIC PROSTATECTOMY;  Surgeon: Ceasar Mons, MD;  Location: [facility];  Service: Urology;  Laterality: N/A;   TOTAL KNEE ARTHROPLASTY Right 05/05/2019   Procedure: RIGHT TOTAL KNEE ARTHROPLASTY;  Surgeon: Frederik Pear, MD;  Location: [facility];  Service: Orthopedics;  Laterality: Right;   Patient Active Problem List   Diagnosis Date Noted   S/P TKR (total knee replacement), right 05/05/2019   Osteoarthritis of right knee 05/02/2019   Prostate cancer ([place]) 03/22/2018   Malignant neoplasm of prostate ([place]) 02/27/2018    ONSET DATE: script dated 03-02-22  REFERRING DIAG: Parkinson's Disease  THERAPY DIAG:  Dysarthria and anarthria  Aphasia  Cognitive communication  deficit  Rationale for Evaluation and Treatment: Rehabilitation  SUBJECTIVE:   SUBJECTIVE STATEMENT: "It was good - I wonder if [name]'s hearing isn't so good though." Pt accompanied by: significant other  PERTINENT HISTORY: Pt well-known to this SLP from previous courses of ST. MBS in   PAIN:  Are you having pain? No  FALLS: Has patient fallen in last 6 months?  See PT evaluation for details  LIVING ENVIRONMENT: Lives with: lives with their family Lives in: House/apartment  PLOF:  Level of assistance: Needed assistance with IADLS Employment: Retired  PATIENT GOALS: Improve loudness and fluency  OBJECTIVE:   DIAGNOSTIC FINDINGS:  MBS 12-22-21 Patient presents with functional oropharyngeal swallowing without aspiration of any consistency tested. He does demonstrate minimal asymptomatic laryngeal penetration of thin liquid due to decreased timing of laryngeal closure. Chin tuck posture with use of straw effective to prevent penetration. Pharyngeal swallow is strong without retention. Oral transiting discoordination noted with pt swallowing barium tablet as he required 2nd bolus of thin to transit through oropharynx. Upon esophageal sweep, pt appeared with barium tablet retained in esophagus with pt awareness. Tablet appeared to transit distally after several boluses of liquids. After tablet had transited, pt continued with "phantom" sensation to esophagus. Recommend pt continue diet as tolerated, using chin tuck with liquids if improves comfort, decreases cough. Also encouraged pt to assure maintains strength of voice, cough and expectoration for  airway protection.   Marland Kitchen  PATIENT REPORTED OUTCOME MEASURES (PROM): Communication Effectiveness Survey: Completed today with score 20/40 (higher scores indicate better or more effective communication. "2" was scored with conversaing over the phone, speaking to a friend when upset/angry, and having a conversation with someone at a  distance.  TODAY'S TREATMENT:                                                                                                                                         DATE:  05/16/22: Pt arrives today speaking at mid 60s dB initially for first 4 minutes and then low-mid 60s dB for next 17 minutes in simple conversation with SLP. Loud /a/ average lower 90s dB with rare min A, everyday sentences averaged upper 70s dB with occasional min A. In multi-sentence responses SLP cues for loudness req'd increasing frequency of cues from occasional min to usual min-mod cues due to pt fatigue (ST last today). Pt filled out his CES today with score above.  05/09/22: Pt's loud /a/ average upper 80s dB with rare min A, everyday sentences averaged upper 70s dB with occasional min A. With occasional min-mod A, pt spoke for 15 minutes and measured at low-mid 60s dB; after 10 minutes pt's voice decr'd to low 60s dB. SLP helped pt modify his everyday sentences to make them more current. SLP strongly encouraged pt to use loud /a/ and everyday sentences BID at home.   05/04/22: SLP explained eval results to pt and wife. SLP reiterated pt must practice loud /a/ and everyday sentences as well as homework with speech hierarchy as directed.    PATIENT EDUCATION: Education details: see "today's treatment" Person educated: Patient and Spouse Education method: Explanation and Demonstration Education comprehension: verbalized understanding  HOME EXERCISE PROGRAM: Loud /a/ and everyday sentences.    GOALS: Goals reviewed with patient? No  SHORT TERM GOALS: Target date: 06/09/22  Produce /a/ with average upper 80s dB over three sessions Baseline:05-16-22 Goal status: Ongoing  2.  pt will demo abdominal breathing with sentence responses 85% accuracy x 3 sessions  Baseline:  Goal status: Ongoing  3.  pt will generate 5 minutes simple conversation with low 70s dB average in 3 sessions  Baseline:  Goal status:  Ongoing  4.  Pt will use strategies for dysfluency in 1-2 minute short conversational segments  Baseline:  Goal status: Ongoing   LONG TERM GOALS: Target date: 07/19/21  pt will produce average upper 60s dB in 10 minutes simple to mod complex conversation in 5 sessions  Baseline:  Goal status: Ongoing  2.  pt will maintain upper 60s dB average in 10 minute simple conversation x 3 sessions  Baseline:  Goal status: Ongoing  3.  pt will use abdominal breathing >80% of the time during 10 minute simple conversation in 3 sessions  Baseline:  Goal status: Ongoing  4.  pt will report  fewer requests to repeat himself than prior to ST in 3 sessions  Baseline:  Goal status: Ongoing  5.  Pt will use strategies for dysfluency in 5 minute conversational segments Baseline:  Goal status: Ongoing  6.  Pt will score higher/better on PROM in the last 2 weeks of therapy Baseline:  Goal status: Ongoing  ASSESSMENT:  CLINICAL IMPRESSION: Patient is a 71 y.o. male who was seen today for treatment of dysarthria and communcation deficits in light of Parkinsons Disease. Pt tells SLP that family members ask him to repeat frequently, and he has stopped communicating in social situations due to difficulty being louder when necessary (dart throwing group). .    OBJECTIVE IMPAIRMENTS: Objective impairments include executive functioning, aphasia, and dysarthria. These impairments are limiting patient from household responsibilities, ADLs/IADLs, and effectively communicating at home and in community.Factors affecting potential to achieve goals and functional outcome are  none .Marland Kitchen Patient will benefit from skilled SLP services to address above impairments and improve overall function.  REHAB POTENTIAL: Good  PLAN:  SLP FREQUENCY: 2x/week  SLP DURATION: 8 weeks  PLANNED INTERVENTIONS: Environmental controls, Cueing hierachy, Cognitive reorganization, Internal/external aids, Oral motor exercises,  Functional tasks, Multimodal communication approach, SLP instruction and feedback, Compensatory strategies, and Patient/family education    [facility], [place] 05/16/2022, 11:02 PM

## 2022-05-18 ENCOUNTER — Ambulatory Visit: Payer: PPO

## 2022-05-18 DIAGNOSIS — R29818 Other symptoms and signs involving the nervous system: Secondary | ICD-10-CM | POA: Diagnosis not present

## 2022-05-18 DIAGNOSIS — R4701 Aphasia: Secondary | ICD-10-CM

## 2022-05-18 DIAGNOSIS — R41841 Cognitive communication deficit: Secondary | ICD-10-CM

## 2022-05-18 DIAGNOSIS — R471 Dysarthria and anarthria: Secondary | ICD-10-CM

## 2022-05-18 NOTE — Therapy (Signed)
OUTPATIENT SPEECH LANGUAGE PATHOLOGY [facility]   Patient Name: Matthew [name] MRN: [id number] DOB:Jun 23, 1950, 71 y.o., male Today's Date: 05/19/2022  PCP: Matthew Kettle, MD REFERRING PROVIDER: Alonza Bogus, DO  END OF SESSION:  End of Session - 05/19/22 2317     Visit Number 4    Number of Visits 17    Date for SLP Re-Evaluation 07/19/22    SLP Start Time 1450    SLP Stop Time  6160    SLP Time Calculation (min) 40 min    Activity Tolerance Patient tolerated treatment well               Past Medical History:  Diagnosis Date   Anxiety    Arthritis    GERD (gastroesophageal reflux disease)    occ remote history   Hypertension    Parkinson disease    Prostate cancer ([place])    Right knee pain    questionable meniscal tear   Seasonal allergies    Past Surgical History:  Procedure Laterality Date   COLONOSCOPY     Leg Laceration Repair  Left    Age 53   PELVIC LYMPH NODE DISSECTION Bilateral 03/22/2018   Procedure: PELVIC LYMPH NODE DISSECTION;  Surgeon: Matthew Mons, MD;  Location: [facility];  Service: Urology;  Laterality: Bilateral;   PROSTATE BIOPSY     ROBOT ASSISTED LAPAROSCOPIC RADICAL PROSTATECTOMY N/A 03/22/2018   Procedure: XI ROBOTIC ASSISTED LAPAROSCOPIC PROSTATECTOMY;  Surgeon: Matthew Mons, MD;  Location: [facility];  Service: Urology;  Laterality: N/A;   TOTAL KNEE ARTHROPLASTY Right 05/05/2019   Procedure: RIGHT TOTAL KNEE ARTHROPLASTY;  Surgeon: Matthew Pear, MD;  Location: [facility];  Service: Orthopedics;  Laterality: Right;   Patient Active Problem List   Diagnosis Date Noted   S/P TKR (total knee replacement), right 05/05/2019   Osteoarthritis of right knee 05/02/2019   Prostate cancer ([place]) 03/22/2018   Malignant neoplasm of prostate ([place]) 02/27/2018    ONSET DATE: script dated 03-02-22  REFERRING DIAG: Parkinson's Disease  THERAPY DIAG:  Dysarthria and anarthria  Cognitive communication  deficit  Aphasia  Rationale for Evaluation and Treatment: Rehabilitation  SUBJECTIVE:   SUBJECTIVE STATEMENT: "I had to wear a mask today, Matthew [name]." Pt accompanied by: self  PERTINENT HISTORY: Pt well-known to this SLP from previous courses of ST.  PAIN:  Are you having pain? No  FALLS: Has patient fallen in last 6 months?  See PT evaluation for details  LIVING ENVIRONMENT: Lives with: lives with their family Lives in: House/apartment  PLOF:  Level of assistance: Needed assistance with IADLS Employment: Retired  PATIENT GOALS: Improve loudness and fluency  OBJECTIVE:   DIAGNOSTIC FINDINGS:  MBS 12-22-21 Patient presents with functional oropharyngeal swallowing without aspiration of any consistency tested. He does demonstrate minimal asymptomatic laryngeal penetration of thin liquid due to decreased timing of laryngeal closure. Chin tuck posture with use of straw effective to prevent penetration. Pharyngeal swallow is strong without retention. Oral transiting discoordination noted with pt swallowing barium tablet as he required 2nd bolus of thin to transit through oropharynx. Upon esophageal sweep, pt appeared with barium tablet retained in esophagus with pt awareness. Tablet appeared to transit distally after several boluses of liquids. After tablet had transited, pt continued with "phantom" sensation to esophagus. Recommend pt continue diet as tolerated, using chin tuck with liquids if improves comfort, decreases cough. Also encouraged pt to assure maintains strength of voice, cough and expectoration for airway protection.   Marland Kitchen  PATIENT REPORTED  OUTCOME MEASURES (PROM): Communication Effectiveness Survey: Completed today with score 20/40 (higher scores indicate better or more effective communication. "2" was scored with conversaing over the phone, speaking to a friend when upset/angry, and having a conversation with someone at a distance.  TODAY'S TREATMENT:                                                                                                                                          DATE:  05/18/22: Pt arrived today with a mask. De-masked for loud /a/ at average mid 90s dB, and everyday sentences averaged upper 70s dB with independence. In sentence responses [name] averaged WNL volume. However in short conversational segments of 4 minutes x4 pt was able to maintain WNL volume  for no more than 90 seconds, despite SLP providing mod cues occasionally. Noted that pt lost WNL volume more readily as reps progressed, indicating fatigue.  05/16/22: Pt arrives today speaking at mid 60s dB initially for first 4 minutes and then low-mid 60s dB for next 17 minutes in simple conversation with SLP. Loud /a/ average lower 90s dB with rare min A, everyday sentences averaged upper 70s dB with occasional min A. In multi-sentence responses SLP cues for loudness req'd increasing frequency of cues from occasional min to usual min-mod cues due to pt fatigue (ST last today). Pt filled out his CES today with score above.  05/09/22: Pt's loud /a/ average upper 80s dB with rare min A, everyday sentences averaged upper 70s dB with occasional min A. With occasional min-mod A, pt spoke for 15 minutes and measured at low-mid 60s dB; after 10 minutes pt's voice decr'd to low 60s dB. SLP helped pt modify his everyday sentences to make them more current. SLP strongly encouraged pt to use loud /a/ and everyday sentences BID at home.   05/04/22: SLP explained eval results to pt and wife. SLP reiterated pt must practice loud /a/ and everyday sentences as well as homework with speech hierarchy as directed.    PATIENT EDUCATION: Education details: see "today's treatment" Person educated: Patient and Spouse Education method: Explanation and Demonstration Education comprehension: verbalized understanding  HOME EXERCISE PROGRAM: Loud /a/ and everyday sentences.    GOALS: Goals reviewed with patient?  No  SHORT TERM GOALS: Target date: 06/09/22  Produce /a/ with average upper 80s dB over three sessions Baseline:05-16-22, 05-18-22 Goal status: Ongoing  2.  pt will demo abdominal breathing with sentence responses 85% accuracy x 3 sessions  Baseline:  Goal status: Ongoing  3.  pt will generate 5 minutes simple conversation with low 70s dB average in 3 sessions  Baseline:  Goal status: Ongoing  4.  Pt will use strategies for dysfluency in 1-2 minute short conversational segments  Baseline:  Goal status: Ongoing   LONG TERM GOALS: Target date: 07/19/21  pt will produce average upper 60s dB in 10 minutes simple to  mod complex conversation in 5 sessions  Baseline:  Goal status: Ongoing  2.  pt will maintain upper 60s dB average in 10 minute simple conversation x 3 sessions  Baseline:  Goal status: Ongoing  3.  pt will use abdominal breathing >80% of the time during 10 minute simple conversation in 3 sessions  Baseline:  Goal status: Ongoing  4.  pt will report fewer requests to repeat himself than prior to ST in 3 sessions  Baseline:  Goal status: Ongoing  5.  Pt will use strategies for dysfluency in 5 minute conversational segments Baseline:  Goal status: Ongoing  6.  Pt will score higher/better on PROM in the last 2 weeks of therapy Baseline:  Goal status: Ongoing  ASSESSMENT:  CLINICAL IMPRESSION: Patient is a 71 y.o. male who was seen today for treatment of dysarthria and communcation deficits in light of Parkinsons Disease. Pt tells SLP that family members ask him to repeat frequently, and he has stopped communicating in social situations due to difficulty being louder when necessary (dart throwing group). .    OBJECTIVE IMPAIRMENTS: Objective impairments include executive functioning, aphasia, and dysarthria. These impairments are limiting patient from household responsibilities, ADLs/IADLs, and effectively communicating at home and in community.Factors affecting  potential to achieve goals and functional outcome are  none .Marland Kitchen Patient will benefit from skilled SLP services to address above impairments and improve overall function.  REHAB POTENTIAL: Good  PLAN:  SLP FREQUENCY: 2x/week  SLP DURATION: 8 weeks  PLANNED INTERVENTIONS: Environmental controls, Cueing hierachy, Cognitive reorganization, Internal/external aids, Oral motor exercises, Functional tasks, Multimodal communication approach, SLP instruction and feedback, Compensatory strategies, and Patient/family education    [facility], [place] 05/19/2022, 11:17 PM

## 2022-05-23 ENCOUNTER — Ambulatory Visit: Payer: PPO | Attending: Neurology

## 2022-05-23 ENCOUNTER — Ambulatory Visit: Payer: PPO

## 2022-05-23 DIAGNOSIS — R29818 Other symptoms and signs involving the nervous system: Secondary | ICD-10-CM | POA: Diagnosis not present

## 2022-05-23 DIAGNOSIS — R293 Abnormal posture: Secondary | ICD-10-CM | POA: Insufficient documentation

## 2022-05-23 DIAGNOSIS — R2689 Other abnormalities of gait and mobility: Secondary | ICD-10-CM | POA: Insufficient documentation

## 2022-05-23 DIAGNOSIS — G20A2 Parkinson's disease without dyskinesia, with fluctuations: Secondary | ICD-10-CM | POA: Diagnosis not present

## 2022-05-23 DIAGNOSIS — R2681 Unsteadiness on feet: Secondary | ICD-10-CM

## 2022-05-23 DIAGNOSIS — R471 Dysarthria and anarthria: Secondary | ICD-10-CM | POA: Insufficient documentation

## 2022-05-23 DIAGNOSIS — M6281 Muscle weakness (generalized): Secondary | ICD-10-CM

## 2022-05-23 DIAGNOSIS — R4701 Aphasia: Secondary | ICD-10-CM | POA: Diagnosis not present

## 2022-05-23 DIAGNOSIS — R41841 Cognitive communication deficit: Secondary | ICD-10-CM | POA: Diagnosis present

## 2022-05-23 NOTE — Therapy (Signed)
OUTPATIENT SPEECH LANGUAGE PATHOLOGY [facility]   Patient Name: Matthew [name] MRN: [id number] DOB:09-23-1950, 72 y.o., male Today's Date: 05/23/2022  PCP: Lona Kettle, MD REFERRING PROVIDER: Alonza Bogus, DO  END OF SESSION:  End of Session - 05/23/22 1552     Visit Number 5    Number of Visits 17    Date for SLP Re-Evaluation 07/19/22    SLP Start Time 1450    SLP Stop Time  4098    SLP Time Calculation (min) 42 min    Activity Tolerance Patient tolerated treatment well                Past Medical History:  Diagnosis Date   Anxiety    Arthritis    GERD (gastroesophageal reflux disease)    occ remote history   Hypertension    Parkinson disease    Prostate cancer ([place])    Right knee pain    questionable meniscal tear   Seasonal allergies    Past Surgical History:  Procedure Laterality Date   COLONOSCOPY     Leg Laceration Repair  Left    Age 73   PELVIC LYMPH NODE DISSECTION Bilateral 03/22/2018   Procedure: PELVIC LYMPH NODE DISSECTION;  Surgeon: Ceasar Mons, MD;  [facility];  Service: Urology;  Laterality: Bilateral;   PROSTATE BIOPSY     ROBOT ASSISTED LAPAROSCOPIC RADICAL PROSTATECTOMY N/A 03/22/2018   Procedure: XI ROBOTIC ASSISTED LAPAROSCOPIC PROSTATECTOMY;  Surgeon: Ceasar Mons, MD;  [facility]: [facility];  Service: Urology;  Laterality: N/A;   TOTAL KNEE ARTHROPLASTY Right 05/05/2019   Procedure: RIGHT TOTAL KNEE ARTHROPLASTY;  Surgeon: Frederik Pear, MD;  Location: [facility];  Service: Orthopedics;  Laterality: Right;   Patient Active Problem List   Diagnosis Date Noted   S/P TKR (total knee replacement), right 05/05/2019   Osteoarthritis of right knee 05/02/2019   Prostate cancer ([place]) 03/22/2018   Malignant neoplasm of prostate ([place]) 02/27/2018    ONSET DATE: script dated 03-02-22  REFERRING DIAG: Parkinson's Disease  THERAPY DIAG:  Dysarthria and anarthria  Cognitive communication  deficit  Aphasia  Rationale for Evaluation and Treatment: Rehabilitation  SUBJECTIVE:   SUBJECTIVE STATEMENT: "Oh crap I forgot to take my meds." Pt accompanied by: self  PERTINENT HISTORY: Pt well-known to this SLP from previous courses of ST.  PAIN:  Are you having pain? No  FALLS: Has patient fallen in last 6 months?  See PT evaluation for details  LIVING ENVIRONMENT: Lives with: lives with their family Lives in: House/apartment  PLOF:  Level of assistance: Needed assistance with IADLS Employment: Retired  PATIENT GOALS: Improve loudness and fluency  OBJECTIVE:   DIAGNOSTIC FINDINGS:  MBS 12-22-21 Patient presents with functional oropharyngeal swallowing without aspiration of any consistency tested. He does demonstrate minimal asymptomatic laryngeal penetration of thin liquid due to decreased timing of laryngeal closure. Chin tuck posture with use of straw effective to prevent penetration. Pharyngeal swallow is strong without retention. Oral transiting discoordination noted with pt swallowing barium tablet as he required 2nd bolus of thin to transit through oropharynx. Upon esophageal sweep, pt appeared with barium tablet retained in esophagus with pt awareness. Tablet appeared to transit distally after several boluses of liquids. After tablet had transited, pt continued with "phantom" sensation to esophagus. Recommend pt continue diet as tolerated, using chin tuck with liquids if improves comfort, decreases cough. Also encouraged pt to assure maintains strength of voice, cough and expectoration for airway protection.   Marland Kitchen  PATIENT  REPORTED OUTCOME MEASURES (PROM): Communication Effectiveness Survey: Completed today with score 20/40 (higher scores indicate better or more effective communication. "2" was scored with conversaing over the phone, speaking to a friend when upset/angry, and having a conversation with someone at a distance.  TODAY'S TREATMENT:                                                                                                                                          DATE:  05/23/22: Pt relates to ST that he feels tired from PT. Conversation for 10 minutes was mid 60s dB and decr'd to low-mid 60s after 5 minutes, with min A from SLP to incr loudness and little success. SLP also noted incr 'd frequency of anomia/rephrasing. 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  Loud /a/ at average low-mid 90s dB, and everyday sentences averaged upper 70s dB with independence. In sentence responses pt's volume incr'd to upper 60s dB average for first 3 responses then began to fade until SLP cue for incr'd loudness and "more effort - be loud." 25 minutes into session pt began drooling. SLP cued pt to close mouth and swallow and pt did so x3 spontaneously: "I'm trying to get all of the juice out, Glendell Docker," and pt smiled.  Pt saw his meds as he put his drink in his walker bag and told SLP he forgot his meds between 2:30 and 3:00 pm. SLP told [name] this as SLP walked pt out.   05/18/22: Pt arrived today with a mask. De-masked for loud /a/ at average mid 90s dB, and everyday sentences averaged upper 70s dB with independence. In sentence responses [name] averaged WNL volume. However in short conversational segments of 4 minutes x4 pt was able to maintain WNL volume  for no more than 90 seconds, despite SLP providing mod cues occasionally. Noted that pt lost WNL volume more readily as reps progressed, indicating fatigue.  05/16/22: Pt arrives today speaking at mid 60s dB initially for first 4 minutes and then low-mid 60s dB for next 17 minutes in simple conversation with SLP. Loud /a/ average lower 90s dB with rare min A, everyday sentences averaged  upper 70s dB with occasional min A. In multi-sentence responses SLP cues for loudness req'd increasing frequency of cues from occasional min to usual min-mod cues due to pt fatigue (ST last today). Pt filled out his CES today with score above.  05/09/22: Pt's loud /a/ average upper 80s dB with rare min A, everyday sentences averaged upper 70s dB with occasional min A. With occasional min-mod A, pt spoke for 15 minutes and measured at low-mid  60s dB; after 10 minutes pt's voice decr'd to low 60s dB. SLP helped pt modify his everyday sentences to make them more current. SLP strongly encouraged pt to use loud /a/ and everyday sentences BID at home.   05/04/22: SLP explained eval results to pt and wife. SLP reiterated pt must practice loud /a/ and everyday sentences as well as homework with speech hierarchy as directed.    PATIENT EDUCATION: Education details: see "today's treatment" Person educated: Patient and Spouse Education method: Explanation and Demonstration Education comprehension: verbalized understanding  HOME EXERCISE PROGRAM: Loud /a/ and everyday sentences.    GOALS: Goals reviewed with patient? No  SHORT TERM GOALS: Target date: 06/09/22  Produce /a/ with average upper 80s dB over three sessions Baseline:05-16-22, 05-18-22 Goal status: Met  2.  pt will demo abdominal breathing with sentence responses 85% accuracy x 3 sessions  Baseline:  Goal status: Ongoing  3.  pt will generate 5 minutes simple conversation with low 70s dB average in 3 sessions  Baseline:  Goal status: Ongoing  4.  Pt will use strategies for dysfluency in 1-2 minute short conversational segments  Baseline:  Goal status: Ongoing   LONG TERM GOALS: Target date: 07/19/21  pt will produce average upper 60s dB in 10 minutes simple to mod complex conversation in 5 sessions  Baseline:  Goal status: Ongoing  2.  pt will maintain upper 60s dB average in 10 minute simple conversation x 3 sessions   Baseline:  Goal status: Ongoing  3.  pt will use abdominal breathing >80% of the time during 10 minute simple conversation in 3 sessions  Baseline:  Goal status: Ongoing  4.  pt will report fewer requests to repeat himself than prior to ST in 3 sessions  Baseline:  Goal status: Ongoing  5.  Pt will use strategies for dysfluency in 5 minute conversational segments Baseline:  Goal status: Ongoing  6.  Pt will score higher/better on PROM in the last 2 weeks of therapy Baseline:  Goal status: Ongoing  ASSESSMENT:  CLINICAL IMPRESSION: Patient is a 72 y.o. male who was seen today for treatment of dysarthria and communcation deficits in light of Parkinsons Disease. Pt tells SLP that family members ask him to repeat frequently, and he has stopped communicating in social situations due to difficulty being louder when necessary (dart throwing group). .    OBJECTIVE IMPAIRMENTS: Objective impairments include executive functioning, aphasia, and dysarthria. These impairments are limiting patient from household responsibilities, ADLs/IADLs, and effectively communicating at home and in community.Factors affecting potential to achieve goals and functional outcome are  none .Marland Kitchen Patient will benefit from skilled SLP services to address above impairments and improve overall function.  REHAB POTENTIAL: Good  PLAN:  SLP FREQUENCY: 2x/week  SLP DURATION: 8 weeks  PLANNED INTERVENTIONS: Environmental controls, Cueing hierachy, Cognitive reorganization, Internal/external aids, Oral motor exercises, Functional tasks, Multimodal communication approach, SLP instruction and feedback, Compensatory strategies, and Patient/family education    [facility], [place] 05/23/2022, 3:53 PM

## 2022-05-23 NOTE — Therapy (Signed)
OUTPATIENT PHYSICAL THERAPY NEURO TREATMENT   Patient Name: Matthew [name] MRN: [id number] DOB:23-Aug-1950, 72 y.o., male Today's Date: 03/09/2022   PCP: Lawerance Cruel, MD REFERRING PROVIDER: Carles Collet Eustace Quail, DO     PT End of Session - 05/23/22 1410     Visit Number 18    Number of Visits 22    Date for PT Re-Evaluation 06/20/22    Authorization Type [employer]    Progress Note Due on Visit 26    PT Start Time 1410   late arrival   PT Stop Time 1445    PT Time Calculation (min) 35 min    Equipment Utilized During Treatment Gait belt    Activity Tolerance Patient tolerated treatment well    Behavior During Therapy WFL for tasks assessed/performed                   Past Medical History:  Diagnosis Date   Anxiety    Arthritis    GERD (gastroesophageal reflux disease)    occ remote history   Hypertension    Parkinson disease    Prostate cancer ([place])    Right knee pain    questionable meniscal tear   Seasonal allergies    Past Surgical History:  Procedure Laterality Date   COLONOSCOPY     Leg Laceration Repair  Left    Age 74   PELVIC LYMPH NODE DISSECTION Bilateral 03/22/2018   Procedure: PELVIC LYMPH NODE DISSECTION;  Surgeon: Ceasar Mons, MD;  Location: [facility];  Service: Urology;  Laterality: Bilateral;   PROSTATE BIOPSY     ROBOT ASSISTED LAPAROSCOPIC RADICAL PROSTATECTOMY N/A 03/22/2018   Procedure: XI ROBOTIC ASSISTED LAPAROSCOPIC PROSTATECTOMY;  Surgeon: Ceasar Mons, MD;  Location: [facility];  Service: Urology;  Laterality: N/A;   TOTAL KNEE ARTHROPLASTY Right 05/05/2019   Procedure: RIGHT TOTAL KNEE ARTHROPLASTY;  Surgeon: Frederik Pear, MD;  Location: [facility];  Service: Orthopedics;  Laterality: Right;   Patient Active Problem List   Diagnosis Date Noted   S/P TKR (total knee replacement), right 05/05/2019   Osteoarthritis of right knee 05/02/2019   Prostate cancer ([place]) 03/22/2018   Malignant neoplasm of  prostate ([place]) 02/27/2018    ONSET DATE: 2-3 years  REFERRING DIAG: G20.A2 (ICD-10-CM) - Parkinson's disease without dyskinesia, with fluctuating manifestations   THERAPY DIAG:  Muscle weakness (generalized)  Abnormal posture  Unsteadiness on feet  Other abnormalities of gait and mobility  Parkinson's disease without dyskinesia, with fluctuating manifestations  Rationale for Evaluation and Treatment Rehabilitation  SUBJECTIVE:  SUBJECTIVE STATEMENT: "RLE is feeling very tight"  Pt accompanied by: self  PERTINENT HISTORY: Parkinson's, right TKR,   PAIN:  Are you having pain? No  PRECAUTIONS: None  WEIGHT BEARING RESTRICTIONS No  FALLS: Has patient fallen in last 6 months? Yes. Number of falls 2-3 , unable to perform fall recovery  LIVING ENVIRONMENT: Lives with: lives with their spouse Lives in: House/apartment Stairs: Yes: Internal: 16 steps; can reach both and External: 2-3 steps; on left going up Has following equipment at home: Walker - 4 wheeled  PLOF: Independent with household mobility with device  PATIENT GOALS "get close to normal as possible"  -Be able to walk without fear of falling -Be able to throw horseshoes/darts -Be able to maintain walking program with trekking poles   OBJECTIVE:   TODAY'S TREATMENT: 05/23/22 Activity Comments  RLE demo edema as compared to left. 2+ pitting edema. Score of 1 on Well's Criteria for DVT Provided with screen tool and details. Recommend f/u as needed  NU-step x 8 min Varied resistance Q 60 sec  Strength training -seated AP 30x -LAQ 3x10 5# -sit to stand w/ march 3x5 5#             TODAY'S TREATMENT: 05/16/22 Activity Comments  NU-step resistance intervals to improve activity tolerance Level 5, 2 min warm-up then 1:1  heavy:light  Resistance training -STS to march (15# KB, 3# ankle weights) 10x -LAQ 3x12 3# -hamstring curls 2x10 3# (standing) -march (standing) 2x10 3#  balance -cone taps: wide/midline taps 1x5 3# ankle weights -retrowalking 3# -sidestepping 3#  Partial long sit hamstring stretch 2x60 sec -bilateral knee flexion contracture           TODAY'S TREATMENT: 05/09/22 Activity Comments  STG/LTG completed   Review of POC details   Pt education regarding continued POC               TODAY'S TREATMENT: 05/04/2022 Activity Comments  Sit<>stand 5 reps   Heel/toe raises 2 x 10 reps   Stagger stance forward/back weightshift 10 reps Cues for deliberate foot placement  Lateral weightshift>shift and lift for increased SLS>added single UE lift For progression of SLS, cues for tall reset between sets  Forward>back step and weigthshift Already in HEP, cues to make sure to do this regularly  Squat to up on toes UE support  March in place, 1 UE support x 10 reps Cues for increased height of marching  Standing wide BOS:  head turns x 5, head nods x 5; step and turn, clock turning 360 degrees With UE support    PATIENT EDUCATION: Education details: in pt's HEP notebook-emphasized forward/back stepping as regular exercise.  Discussed/reviewed that for optimal power of lower extremities (with exercises, with exercise classes), he needs UE support  Person educated: Patient Education method: Explanation and Demonstration Education comprehension: verbalized understanding and returned demonstration    HOME EXERCISE PROGRAM Last updated: 04/04/22 Access Code: [email or web address] URL: [email or web address] Date: 04/04/2022 Prepared by: [facility]  Exercises - Goblet Squat with Kettlebell  - 1 x daily - 7 x weekly - 3 sets - 10 reps - Sumo Squat with Dumbbell  - 1 x daily - 7 x weekly - 3 sets - 10 reps - Step Taps on High Step  - 1 x daily - 5 x weekly -  3 sets - 10 reps - Forward and Backward Step Over with Counter Support  - 1 x daily - 5 x weekly -  3 sets - 10 reps    Below measures were taken at time of initial evaluation unless otherwise specified:   DIAGNOSTIC FINDINGS: none recently  COGNITION: Overall cognitive status: Within functional limits for tasks assessed   SENSATION: WFL  COORDINATION: Grossly intact with some deficits in rapid, alternating and divided movement tasks  EDEMA:    MUSCLE TONE: some rigidity noted in BLE   MUSCLE LENGTH: Knee flexion contractures  DTRs:    POSTURE: forward head  LOWER EXTREMITY ROM:     Active  Right Eval Left Eval  Hip flexion    Hip extension    Hip abduction    Hip adduction    Hip internal rotation    Hip external rotation    Knee flexion    Knee extension -10 -10  Ankle dorsiflexion 5 5  Ankle plantarflexion    Ankle inversion    Ankle eversion     (Blank rows = not tested)  LOWER EXTREMITY MMT:    Grossly 4/5 BLE  BED MOBILITY:  NT  TRANSFERS: Assistive device utilized: Quad cane large base and None  Sit to stand: Complete Independence Stand to sit: Complete Independence Chair to chair: Modified independence Floor:  NT , pt report need for total assist at this time  RAMP:  NT  CURB:  Level of Assistance: SBA Assistive device utilized: Environmental consultant - 4 wheeled Curb Comments: increased time for processing and negotiation  STAIRS:  Level of Assistance: Modified Artist Technique: Alternating Pattern  with Bilateral Rails  Number of Stairs: 5   Height of Stairs: 4-6"  Comments:   GAIT: Gait pattern: shuffling, decreased stride Distance walked:  Assistive device utilized: Environmental consultant - 4 wheeled Level of assistance: SBA Comments:   FUNCTIONAL TESTs:  5 times sit to stand: 11 sec Timed up and go (TUG): 15 sec Mini-BEST test: 14/28 Gait Speed: 16.28 sec = 2.01 ft/sec  PATIENT SURVEYS:     PATIENT  EDUCATION: Education details: assessment findings Person educated: Patient Education method: Explanation Education comprehension: verbalized understanding and needs further education   HOME EXERCISE PROGRAM: TBD    GOALS: Goals reviewed with patient? Yes  SHORT TERM GOALS: Target date: 03/31/2022  Patient will be able to perform home program independently for self-management. Baseline: Added to HEP 03/29/2022 Goal status: IN PROGRESS  2.  Improve safety with mobility per time 12 sec TUG to reduce risk for falls Baseline: 15 sec w/ 4WW Goal status:GOAL NOT MET-15.78 sec at best   LONG TERM GOALS: Target date: 06/20/2022  Improve safety and reduce risk for falls with mobility per sore 22/28 Mini-BEST test Baseline: 14/28; 18/28 04/10/22; (05/09/22) 20/28 Goal status: IN PROGRESS   2.  Demonstrate increased gait speed to 3 ft/sec to improve efficiency with community ambulation  Baseline: 2 ft/sec; 2.19 ft/sec 04/10/22; (05/09/22) 2.10 ft/sec Goal status: IN PROGRESS 04/10/22   3.  Demo supervision level for floor to stand transfers to improve functional mobility and facilitate participation in PD-specific exercise class Baseline: NT, pt reports total assist at this time Goal status: MET 04/10/22  4.  Demonstrate coordination and ability to perform throwing tasks to facilitate participation in leisure activities (horseshoes and darts) Baseline: reports unable to perform Goal status: DEFERRED for OT    ASSESSMENT:  CLINICAL IMPRESSION: RLE demo edema with Well's Criteria for DVT used as screening with score of "1". Pt provided with handout of screening tool and score analysis. Continued session without issue and emphasis on large  amplitude, compound movements for strength development.  No adverse response   OBJECTIVE IMPAIRMENTS Abnormal gait, decreased activity tolerance, decreased balance, decreased coordination, decreased mobility, difficulty walking, decreased ROM,  decreased strength, impaired flexibility, improper body mechanics, and postural dysfunction.   ACTIVITY LIMITATIONS carrying, lifting, bending, squatting, transfers, self feeding, and locomotion level  PARTICIPATION LIMITATIONS: cleaning, interpersonal relationship, shopping, community activity, yard work, and leisure activities  [place] Age, Time since onset of injury/illness/exacerbation, and 1 comorbidity: PD, TKR  are also affecting patient's functional outcome.   REHAB POTENTIAL: Good  CLINICAL DECISION MAKING: Evolving/moderate complexity  EVALUATION COMPLEXITY: Moderate  PLAN: PT FREQUENCY: 1x/week  PT DURATION: 6 weeks  PLANNED INTERVENTIONS: Therapeutic exercises, Therapeutic activity, Neuromuscular re-education, Balance training, Gait training, Patient/Family education, Self Care, Joint mobilization, Stair training, Vestibular training, Canalith repositioning, Orthotic/Fit training, DME instructions, Aquatic Therapy, Dry Needling, Electrical stimulation, Cryotherapy, Moist heat, Taping, and Manual therapy  PLAN FOR NEXT SESSION: Push-release balance, higher level balance activities, strength training  2:11 PM, 05/23/22 M. Sherlyn Lees, PT, DPT Physical Therapist- [place] Office Number: [phone]     [place] [facility] [address] [place], [place] [address] Phone # [phone] Fax # [phone]

## 2022-05-24 ENCOUNTER — Ambulatory Visit (HOSPITAL_COMMUNITY)
Admission: RE | Admit: 2022-05-24 | Discharge: 2022-05-24 | Disposition: A | Discharge status: Home / Self Care | Payer: PPO | Source: Ambulatory Visit | Attending: Family Medicine | Admitting: Family Medicine

## 2022-05-24 ENCOUNTER — Other Ambulatory Visit (HOSPITAL_COMMUNITY): Payer: Self-pay | Admitting: Family Medicine

## 2022-05-24 DIAGNOSIS — M7989 Other specified soft tissue disorders: Secondary | ICD-10-CM | POA: Diagnosis not present

## 2022-05-24 DIAGNOSIS — R609 Edema, unspecified: Secondary | ICD-10-CM | POA: Diagnosis not present

## 2022-05-24 DIAGNOSIS — Z6826 Body mass index (BMI) 26.0-26.9, adult: Secondary | ICD-10-CM | POA: Diagnosis not present

## 2022-05-24 DIAGNOSIS — L98499 Non-pressure chronic ulcer of skin of other sites with unspecified severity: Secondary | ICD-10-CM | POA: Diagnosis not present

## 2022-05-25 ENCOUNTER — Ambulatory Visit: Payer: PPO

## 2022-05-25 DIAGNOSIS — R4701 Aphasia: Secondary | ICD-10-CM

## 2022-05-25 DIAGNOSIS — C61 Malignant neoplasm of prostate: Secondary | ICD-10-CM | POA: Diagnosis not present

## 2022-05-25 DIAGNOSIS — R41841 Cognitive communication deficit: Secondary | ICD-10-CM

## 2022-05-25 DIAGNOSIS — R471 Dysarthria and anarthria: Secondary | ICD-10-CM | POA: Diagnosis not present

## 2022-05-25 NOTE — Therapy (Signed)
OUTPATIENT SPEECH LANGUAGE PATHOLOGY PARKINSON'S TREATMENT   Patient Name: Matthew [name] MRN: [id number] DOB:Sep 13, 1950, 72 y.o., male Today's Date: 05/25/2022  PCP: Lona Kettle, MD REFERRING PROVIDER: Alonza Bogus, DO  END OF SESSION:  End of Session - 05/25/22 1627     Visit Number 6    Number of Visits 17    Date for SLP Re-Evaluation 07/19/22    SLP Start Time 54   late   SLP Stop Time  42    SLP Time Calculation (min) 38 min    Activity Tolerance Patient tolerated treatment well                 Past Medical History:  Diagnosis Date   Anxiety    Arthritis    GERD (gastroesophageal reflux disease)    occ remote history   Hypertension    Parkinson disease    Prostate cancer ([place])    Right knee pain    questionable meniscal tear   Seasonal allergies    Past Surgical History:  Procedure Laterality Date   COLONOSCOPY     Leg Laceration Repair  Left    Age 50   PELVIC LYMPH NODE DISSECTION Bilateral 03/22/2018   Procedure: PELVIC LYMPH NODE DISSECTION;  Surgeon: Ceasar Mons, MD;  [facility];  Service: Urology;  Laterality: Bilateral;   PROSTATE BIOPSY     ROBOT ASSISTED LAPAROSCOPIC RADICAL PROSTATECTOMY N/A 03/22/2018   Procedure: XI ROBOTIC ASSISTED LAPAROSCOPIC PROSTATECTOMY;  Surgeon: Ceasar Mons, MD;  [facility];  Service: Urology;  Laterality: N/A;   TOTAL KNEE ARTHROPLASTY Right 05/05/2019   Procedure: RIGHT TOTAL KNEE ARTHROPLASTY;  Surgeon: Frederik Pear, MD;  [facility];  Service: Orthopedics;  Laterality: Right;   Patient Active Problem List   Diagnosis Date Noted   S/P TKR (total knee replacement), right 05/05/2019   Osteoarthritis of right knee 05/02/2019   Prostate cancer ([place]) 03/22/2018   Malignant neoplasm of prostate ([place]) 02/27/2018    ONSET DATE: script dated 03-02-22  REFERRING DIAG: Parkinson's Disease  THERAPY DIAG:  Dysarthria and anarthria  Cognitive communication  deficit  Aphasia  Rationale for Evaluation and Treatment: Rehabilitation  SUBJECTIVE:   SUBJECTIVE STATEMENT: "Sorry I'm late [place]. I thought it was at 3:45 and [name] thought 3:30. She was right." Pt accompanied by: self  PERTINENT HISTORY: Pt well-known to this SLP from previous courses of ST.  PAIN:  Are you having pain? No  FALLS: Has patient fallen in last 6 months?  See PT evaluation for details  LIVING ENVIRONMENT: Lives with: lives with their family Lives in: House/apartment  PLOF:  Level of assistance: Needed assistance with IADLS Employment: Retired  PATIENT GOALS: Improve loudness and fluency  OBJECTIVE:   DIAGNOSTIC FINDINGS:  MBS 12-22-21 Patient presents with functional oropharyngeal swallowing without aspiration of any consistency tested. He does demonstrate minimal asymptomatic laryngeal penetration of thin liquid due to decreased timing of laryngeal closure. Chin tuck posture with use of straw effective to prevent penetration. Pharyngeal swallow is strong without retention. Oral transiting discoordination noted with pt swallowing barium tablet as he required 2nd bolus of thin to transit through oropharynx. Upon esophageal sweep, pt appeared with barium tablet retained in esophagus with pt awareness. Tablet appeared to transit distally after several boluses of liquids. After tablet had transited, pt continued with "phantom" sensation to esophagus. Recommend pt continue diet as tolerated, using chin tuck with liquids if improves comfort, decreases cough. Also encouraged pt to assure maintains strength of  voice, cough and expectoration for airway protection.   Marland Kitchen  PATIENT REPORTED OUTCOME MEASURES (PROM): Communication Effectiveness Survey: Completed today with score 20/40 (higher scores indicate better or more effective communication. "2" was scored with conversaing over the phone, speaking to a friend when upset/angry, and having a conversation with someone at a  distance.  TODAY'S TREATMENT:                                                                                                                                         DATE:  05/25/22: Loud /a/ at average mid 90s dB, and everyday sentences averaged upper 80s dB with initial cues for AB. Pt independent with AB and with louder speech in sentences - no cues necessary from SLP.  Short 3-minute conversational segments with SLP proved mostly successful with pt average loudness in upper 60s- lower 70s with average one cue during each segment.  SLP heard dysfluency x2 today - x4 rhythmic, and x3 rhythmic initial phoneme reps.  05/23/22: Pt relates to ST that he feels tired from PT. Conversation for 10 minutes was mid 60s dB and decr'd to low-mid 60s after 5 minutes, with min A from SLP to incr loudness and little success. SLP also noted incr 'd frequency of anomia/rephrasing. 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  Loud /a/ at average low-mid 90s dB, and everyday sentences averaged upper 70s dB with independence. In sentence responses pt's volume incr'd to upper 60s dB average for first 3 responses then began to fade until SLP cue for incr'd loudness and "more effort - be loud." 25 minutes into session pt began drooling. SLP cued pt to close mouth and swallow and pt did so x3 spontaneously: "I'm trying to get all of the juice out, Glendell Docker," and pt smiled.  Pt saw his meds as he put his drink in his walker bag and told SLP he forgot his meds between 2:30 and 3:00 pm. SLP told [name] this as SLP walked pt out.   05/18/22: Pt arrived today with a mask. De-masked for loud /a/ at average mid 90s dB, and everyday sentences averaged upper 70s dB with independence. In sentence responses  [name] averaged WNL volume. However in short conversational segments of 4 minutes x4 pt was able to maintain WNL volume  for no more than 90 seconds, despite SLP providing mod cues occasionally. Noted that pt lost WNL volume more readily as reps progressed, indicating fatigue.  05/16/22: Pt arrives today speaking at mid 60s dB initially for first 4 minutes and then low-mid 60s dB for next 17 minutes in simple conversation with SLP. Loud /  a/ average lower 90s dB with rare min A, everyday sentences averaged upper 70s dB with occasional min A. In multi-sentence responses SLP cues for loudness req'd increasing frequency of cues from occasional min to usual min-mod cues due to pt fatigue (ST last today). Pt filled out his CES today with score above.  05/09/22: Pt's loud /a/ average upper 80s dB with rare min A, everyday sentences averaged upper 70s dB with occasional min A. With occasional min-mod A, pt spoke for 15 minutes and measured at low-mid 60s dB; after 10 minutes pt's voice decr'd to low 60s dB. SLP helped pt modify his everyday sentences to make them more current. SLP strongly encouraged pt to use loud /a/ and everyday sentences BID at home.   05/04/22: SLP explained eval results to pt and wife. SLP reiterated pt must practice loud /a/ and everyday sentences as well as homework with speech hierarchy as directed.    PATIENT EDUCATION: Education details: see "today's treatment" Person educated: Patient and Spouse Education method: Explanation and Demonstration Education comprehension: verbalized understanding  HOME EXERCISE PROGRAM: Loud /a/ and everyday sentences.    GOALS: Goals reviewed with patient? No  SHORT TERM GOALS: Target date: 06/09/22  Produce /a/ with average upper 80s dB over three sessions Baseline:05-16-22, 05-18-22 Goal status: Met  2.  pt will demo abdominal breathing with sentence responses 85% accuracy x 3 sessions  Baseline: 05/25/22 Goal status: Ongoing  3.  pt  will generate 5 minutes simple conversation with low 70s dB average in 3 sessions  Baseline:  Goal status: Ongoing  4.  Pt will use strategies for dysfluency in 1-2 minute short conversational segments  Baseline:  Goal status: Ongoing   LONG TERM GOALS: Target date: 07/19/21  pt will produce average upper 60s dB in 10 minutes simple to mod complex conversation in 5 sessions  Baseline:  Goal status: Ongoing  2.  pt will maintain upper 60s dB average in 10 minute simple conversation x 3 sessions  Baseline:  Goal status: Ongoing  3.  pt will use abdominal breathing >80% of the time during 10 minute simple conversation in 3 sessions  Baseline:  Goal status: Ongoing  4.  pt will report fewer requests to repeat himself than prior to ST in 3 sessions  Baseline:  Goal status: Ongoing  5.  Pt will use strategies for dysfluency in 5 minute conversational segments Baseline:  Goal status: Ongoing  6.  Pt will score higher/better on PROM in the last 2 weeks of therapy Baseline:  Goal status: Ongoing  ASSESSMENT:  CLINICAL IMPRESSION: Patient is a 72 y.o. male who was seen today for treatment of dysarthria and communcation deficits in light of Parkinsons Disease. Pt tells SLP that family members ask him to repeat frequently, and he has stopped communicating in social situations due to difficulty being louder when necessary (dart throwing group). .    OBJECTIVE IMPAIRMENTS: Objective impairments include executive functioning, aphasia, and dysarthria. These impairments are limiting patient from household responsibilities, ADLs/IADLs, and effectively communicating at home and in community.Factors affecting potential to achieve goals and functional outcome are  none .Marland Kitchen Patient will benefit from skilled SLP services to address above impairments and improve overall function.  REHAB POTENTIAL: Good  PLAN:  SLP FREQUENCY: 2x/week  SLP DURATION: 8 weeks  PLANNED INTERVENTIONS:  Environmental controls, Cueing hierachy, Cognitive reorganization, Internal/external aids, Oral motor exercises, Functional tasks, Multimodal communication approach, SLP instruction and feedback, Compensatory strategies, and Patient/family education    [place],  CCC-SLP 05/25/2022, 4:28 PM

## 2022-05-30 ENCOUNTER — Ambulatory Visit: Payer: PPO

## 2022-05-30 ENCOUNTER — Ambulatory Visit: Payer: Self-pay

## 2022-05-30 DIAGNOSIS — R2681 Unsteadiness on feet: Secondary | ICD-10-CM

## 2022-05-30 DIAGNOSIS — G20A2 Parkinson's disease without dyskinesia, with fluctuations: Secondary | ICD-10-CM

## 2022-05-30 DIAGNOSIS — R471 Dysarthria and anarthria: Secondary | ICD-10-CM

## 2022-05-30 DIAGNOSIS — M6281 Muscle weakness (generalized): Secondary | ICD-10-CM

## 2022-05-30 DIAGNOSIS — R41841 Cognitive communication deficit: Secondary | ICD-10-CM

## 2022-05-30 DIAGNOSIS — R293 Abnormal posture: Secondary | ICD-10-CM

## 2022-05-30 DIAGNOSIS — R2689 Other abnormalities of gait and mobility: Secondary | ICD-10-CM

## 2022-05-30 DIAGNOSIS — R4701 Aphasia: Secondary | ICD-10-CM

## 2022-05-30 DIAGNOSIS — R29818 Other symptoms and signs involving the nervous system: Secondary | ICD-10-CM

## 2022-05-30 NOTE — Therapy (Signed)
OUTPATIENT PHYSICAL THERAPY NEURO TREATMENT   Patient Name: Matthew [name] MRN: [id number] DOB:[birth year], 72 y.o., male Today's Date: 03/09/2022   PCP: Lawerance Cruel, MD REFERRING PROVIDER: Carles Collet Eustace Quail, DO     PT End of Session - 05/30/22 1456     Visit Number 19    Number of Visits 22    Date for PT Re-Evaluation 06/20/22    Authorization Type [employer]    Progress Note Due on Visit 26    PT Start Time 1445    PT Stop Time 1530    PT Time Calculation (min) 45 min    Equipment Utilized During Treatment Gait belt    Activity Tolerance Patient tolerated treatment well    Behavior During Therapy WFL for tasks assessed/performed                   Past Medical History:  Diagnosis Date   Anxiety    Arthritis    GERD (gastroesophageal reflux disease)    occ remote history   Hypertension    Parkinson disease    Prostate cancer ([place])    Right knee pain    questionable meniscal tear   Seasonal allergies    Past Surgical History:  Procedure Laterality Date   COLONOSCOPY     Leg Laceration Repair  Left    Age 55   PELVIC LYMPH NODE DISSECTION Bilateral 03/22/2018   Procedure: PELVIC LYMPH NODE DISSECTION;  Surgeon: Ceasar Mons, MD;  Location: [facility];  Service: Urology;  Laterality: Bilateral;   PROSTATE BIOPSY     ROBOT ASSISTED LAPAROSCOPIC RADICAL PROSTATECTOMY N/A 03/22/2018   Procedure: XI ROBOTIC ASSISTED LAPAROSCOPIC PROSTATECTOMY;  Surgeon: Ceasar Mons, MD;  Location: [facility];  Service: Urology;  Laterality: N/A;   TOTAL KNEE ARTHROPLASTY Right 05/05/2019   Procedure: RIGHT TOTAL KNEE ARTHROPLASTY;  Surgeon: Frederik Pear, MD;  Location: [facility];  Service: Orthopedics;  Laterality: Right;   Patient Active Problem List   Diagnosis Date Noted   S/P TKR (total knee replacement), right 05/05/2019   Osteoarthritis of right knee 05/02/2019   Prostate cancer ([place]) 03/22/2018   Malignant neoplasm of prostate ([place])  02/27/2018    ONSET DATE: 2-3 years  REFERRING DIAG: G20.A2 (ICD-10-CM) - Parkinson's disease without dyskinesia, with fluctuating manifestations   THERAPY DIAG:  Muscle weakness (generalized)  Abnormal posture  Unsteadiness on feet  Other abnormalities of gait and mobility  Parkinson's disease without dyskinesia, with fluctuating manifestations  Other symptoms and signs involving the nervous system  Rationale for Evaluation and Treatment Rehabilitation  SUBJECTIVE:  SUBJECTIVE STATEMENT: Luckily nothing came of the vascular assessment, no DVT  Pt accompanied by: self  PERTINENT HISTORY: Parkinson's, right TKR,   PAIN:  Are you having pain? No  PRECAUTIONS: None  WEIGHT BEARING RESTRICTIONS No  FALLS: Has patient fallen in last 6 months? Yes. Number of falls 2-3 , unable to perform fall recovery  LIVING ENVIRONMENT: Lives with: lives with their spouse Lives in: House/apartment Stairs: Yes: Internal: 16 steps; can reach both and External: 2-3 steps; on left going up Has following equipment at home: Walker - 4 wheeled  PLOF: Independent with household mobility with device  PATIENT GOALS "get close to normal as possible"  -Be able to walk without fear of falling -Be able to throw horseshoes/darts -Be able to maintain walking program with trekking poles   OBJECTIVE:   TODAY'S TREATMENT: 05/30/22 Activity Comments  NU-step x 6 min for dynamic warm-up   Push-release all directions for reactive balance Most difficulty w/ retro-LOB  Single arm cable rows 3x10 15#, emphasis on wide BOS, split stance  2MWT 360 ft w/ 7DZ, cue of "walk with speed"  Gait training Use of measured distance for number of steps 4x85 ft -380 ft x 2 MWT at end of session           PATIENT  EDUCATION: Education details: in pt's HEP notebook-emphasized forward/back stepping as regular exercise.  Discussed/reviewed that for optimal power of lower extremities (with exercises, with exercise classes), he needs UE support  Person educated: Patient Education method: Explanation and Demonstration Education comprehension: verbalized understanding and returned demonstration    HOME EXERCISE PROGRAM Last updated: 04/04/22 Access Code: [email or web address] URL: [email or web address] Date: 04/04/2022 Prepared by: [facility]  Exercises - Goblet Squat with Kettlebell  - 1 x daily - 7 x weekly - 3 sets - 10 reps - Sumo Squat with Dumbbell  - 1 x daily - 7 x weekly - 3 sets - 10 reps - Step Taps on High Step  - 1 x daily - 5 x weekly - 3 sets - 10 reps - Forward and Backward Step Over with Counter Support  - 1 x daily - 5 x weekly - 3 sets - 10 reps    Below measures were taken at time of initial evaluation unless otherwise specified:   DIAGNOSTIC FINDINGS: none recently  COGNITION: Overall cognitive status: Within functional limits for tasks assessed   SENSATION: WFL  COORDINATION: Grossly intact with some deficits in rapid, alternating and divided movement tasks  EDEMA:    MUSCLE TONE: some rigidity noted in BLE   MUSCLE LENGTH: Knee flexion contractures  DTRs:    POSTURE: forward head  LOWER EXTREMITY ROM:     Active  Right Eval Left Eval  Hip flexion    Hip extension    Hip abduction    Hip adduction    Hip internal rotation    Hip external rotation    Knee flexion    Knee extension -10 -10  Ankle dorsiflexion 5 5  Ankle plantarflexion    Ankle inversion    Ankle eversion     (Blank rows = not tested)  LOWER EXTREMITY MMT:    Grossly 4/5 BLE  BED MOBILITY:  NT  TRANSFERS: Assistive device utilized: Quad cane large base and None  Sit to stand: Complete Independence Stand to sit: Complete  Independence Chair to chair: Modified independence Floor:  NT , pt report need for total assist at this time  RAMP:  NT  CURB:  Level of Assistance: SBA Assistive device utilized: Environmental consultant - 4 wheeled Curb Comments: increased time for processing and negotiation  STAIRS:  Level of Assistance: Modified independence  Stair Negotiation Technique: Alternating Pattern  with Bilateral Rails  Number of Stairs: 5   Height of Stairs: 4-6"  Comments:   GAIT: Gait pattern: shuffling, decreased stride Distance walked:  Assistive device utilized: Environmental consultant - 4 wheeled Level of assistance: SBA Comments:   FUNCTIONAL TESTs:  5 times sit to stand: 11 sec Timed up and go (TUG): 15 sec Mini-BEST test: 14/28 Gait Speed: 16.28 sec = 2.01 ft/sec  PATIENT SURVEYS:     PATIENT EDUCATION: Education details: assessment findings Person educated: Patient Education method: Explanation Education comprehension: verbalized understanding and needs further education   HOME EXERCISE PROGRAM: TBD    GOALS: Goals reviewed with patient? Yes  SHORT TERM GOALS: Target date: 03/31/2022  Patient will be able to perform home program independently for self-management. Baseline: Added to HEP 03/29/2022 Goal status: IN PROGRESS  2.  Improve safety with mobility per time 12 sec TUG to reduce risk for falls Baseline: 15 sec w/ 4WW Goal status:GOAL NOT MET-15.78 sec at best   LONG TERM GOALS: Target date: 06/20/2022  Improve safety and reduce risk for falls with mobility per sore 22/28 Mini-BEST test Baseline: 14/28; 18/28 04/10/22; (05/09/22) 20/28 Goal status: IN PROGRESS   2.  Demonstrate increased gait speed to 3 ft/sec to improve efficiency with community ambulation  Baseline: 2 ft/sec; 2.19 ft/sec 04/10/22; (05/09/22) 2.10 ft/sec Goal status: IN PROGRESS 04/10/22   3.  Demo supervision level for floor to stand transfers to improve functional mobility and facilitate participation in PD-specific  exercise class Baseline: NT, pt reports total assist at this time Goal status: MET 04/10/22  4.  Demonstrate coordination and ability to perform throwing tasks to facilitate participation in leisure activities (horseshoes and darts) Baseline: reports unable to perform Goal status: DEFERRED for OT    ASSESSMENT:  CLINICAL IMPRESSION: Emphasis on reactive balance at outset of treatment by preforming push-release with greatest deficits being in posterior direction requiring numerous small-amplitude steps and complete LOB 50% of trials. Upper body resistance by cable row movement to simulate opening heavy doors and to provide trunk rotation and withstand postural perturbation. Gait training initiated with emphasis on speed and pt demo default to flexed knee posture with foot flat loading response. Gait training w/ emphasis on large step length by counting paces along straightaways and then combined large step length with speed with good carryover.   OBJECTIVE IMPAIRMENTS Abnormal gait, decreased activity tolerance, decreased balance, decreased coordination, decreased mobility, difficulty walking, decreased ROM, decreased strength, impaired flexibility, improper body mechanics, and postural dysfunction.   ACTIVITY LIMITATIONS carrying, lifting, bending, squatting, transfers, self feeding, and locomotion level  PARTICIPATION LIMITATIONS: cleaning, interpersonal relationship, shopping, community activity, yard work, and leisure activities  [place] Age, Time since onset of injury/illness/exacerbation, and 1 comorbidity: PD, TKR  are also affecting patient's functional outcome.   REHAB POTENTIAL: Good  CLINICAL DECISION MAKING: Evolving/moderate complexity  EVALUATION COMPLEXITY: Moderate  PLAN: PT FREQUENCY: 1x/week  PT DURATION: 6 weeks  PLANNED INTERVENTIONS: Therapeutic exercises, Therapeutic activity, Neuromuscular re-education, Balance training, Gait training, Patient/Family  education, Self Care, Joint mobilization, Stair training, Vestibular training, Canalith repositioning, Orthotic/Fit training, DME instructions, Aquatic Therapy, Dry Needling, Electrical stimulation, Cryotherapy, Moist heat, Taping, and Manual therapy  PLAN FOR NEXT SESSION: Push-release balance, higher level balance activities, strength training  2:57  PM, 05/30/22 M. Sherlyn Lees, PT, DPT Physical Therapist- [place] Office Number: [phone]     [place] [facility] [address], [place], [place] [address] Phone # [phone] Fax # [phone]

## 2022-05-30 NOTE — Therapy (Signed)
OUTPATIENT SPEECH LANGUAGE PATHOLOGY PARKINSON'S TREATMENT   Patient Name: Matthew [name] MRN: [id number] DOB:Mar 10, 1951, 72 y.o., male Today's Date: 05/30/2022  PCP: Lona Kettle, MD REFERRING PROVIDER: Alonza Bogus, DO  END OF SESSION:  End of Session - 05/30/22 1445     Visit Number 7    Number of Visits 17    Date for SLP Re-Evaluation 07/19/22    SLP Start Time 1406    SLP Stop Time  1444    SLP Time Calculation (min) 38 min    Activity Tolerance Patient tolerated treatment well                  Past Medical History:  Diagnosis Date   Anxiety    Arthritis    GERD (gastroesophageal reflux disease)    occ remote history   Hypertension    Parkinson disease    Prostate cancer ([place])    Right knee pain    questionable meniscal tear   Seasonal allergies    Past Surgical History:  Procedure Laterality Date   COLONOSCOPY     Leg Laceration Repair  Left    Age 54   PELVIC LYMPH NODE DISSECTION Bilateral 03/22/2018   Procedure: PELVIC LYMPH NODE DISSECTION;  Surgeon: Ceasar Mons, MD;  Location: [facility];  Service: Urology;  Laterality: Bilateral;   PROSTATE BIOPSY     ROBOT ASSISTED LAPAROSCOPIC RADICAL PROSTATECTOMY N/A 03/22/2018   Procedure: XI ROBOTIC ASSISTED LAPAROSCOPIC PROSTATECTOMY;  Surgeon: Ceasar Mons, MD;  Location: [facility];  Service: Urology;  Laterality: N/A;   TOTAL KNEE ARTHROPLASTY Right 05/05/2019   Procedure: RIGHT TOTAL KNEE ARTHROPLASTY;  Surgeon: Frederik Pear, MD;  Location: [facility];  Service: Orthopedics;  Laterality: Right;   Patient Active Problem List   Diagnosis Date Noted   S/P TKR (total knee replacement), right 05/05/2019   Osteoarthritis of right knee 05/02/2019   Prostate cancer ([place]) 03/22/2018   Malignant neoplasm of prostate ([place]) 02/27/2018    ONSET DATE: script dated 03-02-22  REFERRING DIAG: Parkinson's Disease  THERAPY DIAG:  Dysarthria and anarthria  Aphasia  Cognitive communication  deficit  Rationale for Evaluation and Treatment: Rehabilitation  SUBJECTIVE:   SUBJECTIVE STATEMENT: "Sorry I'm late Glendell Docker. I thought it was at 3:45 and [name] thought 3:30. She was right." Pt accompanied by: self  PERTINENT HISTORY: Pt well-known to this SLP from previous courses of ST.  PAIN:  Are you having pain? No  FALLS: Has patient fallen in last 6 months?  See PT evaluation for details  LIVING ENVIRONMENT: Lives with: lives with their family Lives in: House/apartment  PLOF:  Level of assistance: Needed assistance with IADLS Employment: Retired  PATIENT GOALS: Improve loudness and fluency  OBJECTIVE:   DIAGNOSTIC FINDINGS:  MBS 12-22-21 Patient presents with functional oropharyngeal swallowing without aspiration of any consistency tested. He does demonstrate minimal asymptomatic laryngeal penetration of thin liquid due to decreased timing of laryngeal closure. Chin tuck posture with use of straw effective to prevent penetration. Pharyngeal swallow is strong without retention. Oral transiting discoordination noted with pt swallowing barium tablet as he required 2nd bolus of thin to transit through oropharynx. Upon esophageal sweep, pt appeared with barium tablet retained in esophagus with pt awareness. Tablet appeared to transit distally after several boluses of liquids. After tablet had transited, pt continued with "phantom" sensation to esophagus. Recommend pt continue diet as tolerated, using chin tuck with liquids if improves comfort, decreases cough. Also encouraged pt to assure maintains strength of voice,  cough and expectoration for airway protection.   Marland Kitchen  PATIENT REPORTED OUTCOME MEASURES (PROM): Communication Effectiveness Survey: Completed today with score 20/40 (higher scores indicate better or more effective communication. "2" was scored with conversaing over the phone, speaking to a friend when upset/angry, and having a conversation with someone at a  distance.  TODAY'S TREATMENT:                                                                                                                                         DATE:  05/30/22: Pt arrived with suboptimal speech volume. Loud /a/ averaged mid-90s dB, and everyday sentences averaged mis-upper 80s dB with initial cues for AB. In short conversational segments of 6 minutes x4, pt generated WNL volume for the first segment, and then WNL volume with decr'ing times as the segments progressed, likely due to fatigue.  05/25/22: Loud /a/ at average mid 90s dB, and everyday sentences averaged upper 80s dB with initial cues for AB. Pt independent with AB and with louder speech in sentences - no cues necessary from SLP.  Short 3-minute conversational segments with SLP proved mostly successful with pt average loudness in upper 60s- lower 70s with average one cue during each segment.  SLP heard dysfluency x2 today - x4 rhythmic, and x3 rhythmic initial phoneme reps.  05/23/22: Pt relates to ST that he feels tired from PT. Conversation for 10 minutes was mid 60s dB and decr'd to low-mid 60s after 5 minutes, with min A from SLP to incr loudness and little success. SLP also noted incr 'd frequency of anomia/rephrasing. 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  Loud /a/ at average low-mid 90s dB, and everyday sentences averaged upper 70s dB with independence. In sentence responses pt's volume incr'd to upper 60s dB average for first 3 responses then began to fade until SLP cue for incr'd loudness and "more effort - be loud." 25 minutes into session pt began drooling. SLP cued pt to close mouth and swallow and pt did so x3 spontaneously: "I'm trying to get all of the juice out, Glendell Docker," and  pt smiled.  Pt saw his meds as he put his drink in his walker bag and told SLP he forgot his meds between 2:30 and 3:00 pm. SLP told [name] this as SLP walked pt out.   05/18/22: Pt arrived today with a mask. De-masked for loud /a/ at average mid 90s dB, and everyday sentences averaged upper 70s dB with independence. In sentence responses [name] averaged WNL volume. However in short conversational segments of 4 minutes x4 pt was able to maintain WNL volume  for no more than 90 seconds, despite SLP providing mod cues occasionally. Noted that pt lost WNL volume more readily as reps progressed, indicating fatigue.  05/16/22: Pt arrives today speaking at mid 60s dB initially for first 4 minutes and then low-mid 60s dB for next 17 minutes in simple conversation with SLP. Loud /a/ average lower 90s dB with rare min A, everyday sentences averaged upper 70s dB with occasional min A. In multi-sentence responses SLP cues for loudness req'd increasing frequency of cues from occasional min to usual min-mod cues due to pt fatigue (ST last today). Pt filled out his CES today with score above.  05/09/22: Pt's loud /a/ average upper 80s dB with rare min A, everyday sentences averaged upper 70s dB with occasional min A. With occasional min-mod A, pt spoke for 15 minutes and measured at low-mid 60s dB; after 10 minutes pt's voice decr'd to low 60s dB. SLP helped pt modify his everyday sentences to make them more current. SLP strongly encouraged pt to use loud /a/ and everyday sentences BID at home.   05/04/22: SLP explained eval results to pt and wife. SLP reiterated pt must practice loud /a/ and everyday sentences as well as homework with speech hierarchy as directed.    PATIENT EDUCATION: Education details: see "today's treatment" Person educated: Patient and Spouse Education method: Explanation and Demonstration Education comprehension: verbalized understanding  HOME EXERCISE PROGRAM: Loud /a/ and everyday  sentences.    GOALS: Goals reviewed with patient? No  SHORT TERM GOALS: Target date: 06/09/22  Produce /a/ with average upper 80s dB over three sessions Baseline:05-16-22, 05-18-22 Goal status: Met  2.  pt will demo abdominal breathing with sentence responses 85% accuracy x 3 sessions  Baseline: 05/25/22 Goal status: Ongoing  3.  pt will generate 5 minutes simple conversation with low 70s dB average in 3 sessions  Baseline:  Goal status: Ongoing  4.  Pt will use strategies for dysfluency in 1-2 minute short conversational segments  Baseline:  Goal status: Ongoing   LONG TERM GOALS: Target date: 07/19/21  pt will produce average upper 60s dB in 10 minutes simple to mod complex conversation in 5 sessions  Baseline:  Goal status: Ongoing  2.  pt will maintain upper 60s dB average in 10 minute simple conversation x 3 sessions  Baseline:  Goal status: Ongoing  3.  pt will use abdominal breathing >80% of the time during 10 minute simple conversation in 3 sessions  Baseline:  Goal status: Ongoing  4.  pt will report fewer requests to repeat himself than prior to ST in 3 sessions  Baseline:  Goal status: Ongoing  5.  Pt will use strategies for dysfluency in 5 minute conversational segments Baseline:  Goal status: Ongoing  6.  Pt will score higher/better on PROM in the last 2 weeks of therapy Baseline:  Goal status: Ongoing  ASSESSMENT:  CLINICAL IMPRESSION: Patient is a 73 y.o. male who was seen today for treatment of dysarthria and communcation deficits in light of Parkinsons Disease. Pt tells SLP that family members ask him to repeat frequently, and he has stopped communicating in social situations due to difficulty being louder when necessary (dart throwing group). .    OBJECTIVE IMPAIRMENTS: Objective impairments include executive functioning, aphasia, and dysarthria. These impairments are limiting patient from household responsibilities, ADLs/IADLs, and effectively  communicating at home and in community.Factors affecting potential to achieve goals and functional outcome are  none .Marland Kitchen Patient will benefit from skilled SLP services  to address above impairments and improve overall function.  REHAB POTENTIAL: Good  PLAN:  SLP FREQUENCY: 2x/week  SLP DURATION: 8 weeks  PLANNED INTERVENTIONS: Environmental controls, Cueing hierachy, Cognitive reorganization, Internal/external aids, Oral motor exercises, Functional tasks, Multimodal communication approach, SLP instruction and feedback, Compensatory strategies, and Patient/family education    [facility], [place] 05/30/2022, 2:46 PM

## 2022-06-01 DIAGNOSIS — R351 Nocturia: Secondary | ICD-10-CM | POA: Diagnosis not present

## 2022-06-01 DIAGNOSIS — C61 Malignant neoplasm of prostate: Secondary | ICD-10-CM | POA: Diagnosis not present

## 2022-06-07 ENCOUNTER — Ambulatory Visit: Payer: PPO

## 2022-06-07 DIAGNOSIS — M6281 Muscle weakness (generalized): Secondary | ICD-10-CM

## 2022-06-07 DIAGNOSIS — R2681 Unsteadiness on feet: Secondary | ICD-10-CM

## 2022-06-07 DIAGNOSIS — R2689 Other abnormalities of gait and mobility: Secondary | ICD-10-CM

## 2022-06-07 DIAGNOSIS — R471 Dysarthria and anarthria: Secondary | ICD-10-CM | POA: Diagnosis not present

## 2022-06-07 DIAGNOSIS — R4701 Aphasia: Secondary | ICD-10-CM

## 2022-06-07 DIAGNOSIS — G20A2 Parkinson's disease without dyskinesia, with fluctuations: Secondary | ICD-10-CM

## 2022-06-07 DIAGNOSIS — R293 Abnormal posture: Secondary | ICD-10-CM

## 2022-06-07 DIAGNOSIS — R41841 Cognitive communication deficit: Secondary | ICD-10-CM

## 2022-06-07 NOTE — Therapy (Signed)
OUTPATIENT SPEECH LANGUAGE PATHOLOGY PARKINSON'S TREATMENT   Patient Name: Matthew [name] MRN: [id number] DOB:06-27-1950, 72 y.o., male Today's Date: 06/07/2022  PCP: Lona Kettle, MD REFERRING PROVIDER: Alonza Bogus, DO  END OF SESSION:  End of Session - 06/07/22 1244     Visit Number 8    Number of Visits 17    Date for SLP Re-Evaluation 07/19/22    SLP Start Time 1236    SLP Stop Time  8811    SLP Time Calculation (min) 39 min    Activity Tolerance Patient tolerated treatment well                  Past Medical History:  Diagnosis Date   Anxiety    Arthritis    GERD (gastroesophageal reflux disease)    occ remote history   Hypertension    Parkinson disease    Prostate cancer ([place])    Right knee pain    questionable meniscal tear   Seasonal allergies    Past Surgical History:  Procedure Laterality Date   COLONOSCOPY     Leg Laceration Repair  Left    Age 82   PELVIC LYMPH NODE DISSECTION Bilateral 03/22/2018   Procedure: PELVIC LYMPH NODE DISSECTION;  Surgeon: Ceasar Mons, MD;  Location: [facility];  Service: Urology;  Laterality: Bilateral;   PROSTATE BIOPSY     ROBOT ASSISTED LAPAROSCOPIC RADICAL PROSTATECTOMY N/A 03/22/2018   Procedure: XI ROBOTIC ASSISTED LAPAROSCOPIC PROSTATECTOMY;  Surgeon: Ceasar Mons, MD;  Location: [facility];  Service: Urology;  Laterality: N/A;   TOTAL KNEE ARTHROPLASTY Right 05/05/2019   Procedure: RIGHT TOTAL KNEE ARTHROPLASTY;  Surgeon: Frederik Pear, MD;  Location: [facility];  Service: Orthopedics;  Laterality: Right;   Patient Active Problem List   Diagnosis Date Noted   S/P TKR (total knee replacement), right 05/05/2019   Osteoarthritis of right knee 05/02/2019   Prostate cancer ([place]) 03/22/2018   Malignant neoplasm of prostate ([place]) 02/27/2018    ONSET DATE: script dated 03-02-22  REFERRING DIAG: Parkinson's Disease  THERAPY DIAG:  Dysarthria and anarthria  Aphasia  Cognitive communication  deficit  Rationale for Evaluation and Treatment: Rehabilitation  SUBJECTIVE:   SUBJECTIVE STATEMENT: "I have done them probably 6 days - once a day mostly but sometimes I do 10." Pt accompanied by: self  PERTINENT HISTORY: Pt well-known to this SLP from previous courses of ST.  PAIN:  Are you having pain? No  FALLS: Has patient fallen in last 6 months?  See PT evaluation for details  LIVING ENVIRONMENT: Lives with: lives with their family Lives in: House/apartment  PLOF:  Level of assistance: Needed assistance with IADLS Employment: Retired  PATIENT GOALS: Improve loudness and fluency  OBJECTIVE:   DIAGNOSTIC FINDINGS:  MBS 12-22-21 Patient presents with functional oropharyngeal swallowing without aspiration of any consistency tested. He does demonstrate minimal asymptomatic laryngeal penetration of thin liquid due to decreased timing of laryngeal closure. Chin tuck posture with use of straw effective to prevent penetration. Pharyngeal swallow is strong without retention. Oral transiting discoordination noted with pt swallowing barium tablet as he required 2nd bolus of thin to transit through oropharynx. Upon esophageal sweep, pt appeared with barium tablet retained in esophagus with pt awareness. Tablet appeared to transit distally after several boluses of liquids. After tablet had transited, pt continued with "phantom" sensation to esophagus. Recommend pt continue diet as tolerated, using chin tuck with liquids if improves comfort, decreases cough. Also encouraged pt to assure maintains strength of voice,  cough and expectoration for airway protection.   Marland Kitchen  PATIENT REPORTED OUTCOME MEASURES (PROM): Communication Effectiveness Survey: Completed today with score 20/40 (higher scores indicate better or more effective communication. "2" was scored with conversaing over the phone, speaking to a friend when upset/angry, and having a conversation with someone at a distance.  TODAY'S  TREATMENT:                                                                                                                                         DATE:  06/06/22: Pt arrived with speech volume average mid-upper 60s dB. Loud /a/ averaged mid-90s dB, and everyday sentences averaged mis-upper 80s dB with initial cues for AB. In simple-mod complex semi-structured sentence responses pt maintained avergae loudness with AB upper 60s dB. In conversational segments pt averaged mid-upper 60s dB with rare min cues for AB.  05/30/22: Pt arrived with suboptimal speech volume. Loud /a/ averaged mid-90s dB, and everyday sentences averaged mis-upper 80s dB with initial cues for AB. In short conversational segments of 6 minutes x4, pt generated WNL volume for the first segment, and then WNL volume with decr'ing times as the segments progressed, likely due to fatigue.  05/25/22: Loud /a/ at average mid 90s dB, and everyday sentences averaged upper 80s dB with initial cues for AB. Pt independent with AB and with louder speech in sentences - no cues necessary from SLP.  Short 3-minute conversational segments with SLP proved mostly successful with pt average loudness in upper 60s- lower 70s with average one cue during each segment.  SLP heard dysfluency x2 today - x4 rhythmic, and x3 rhythmic initial phoneme reps.  05/23/22: Pt relates to ST that he feels tired from PT. Conversation for 10 minutes was mid 60s dB and decr'd to low-mid 60s after 5 minutes, with min A from SLP to incr loudness and little success. SLP also noted incr 'd frequency of anomia/rephrasing. 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  Loud /a/ at average low-mid 90s dB, and everyday sentences averaged upper 70s dB with  independence. In sentence responses pt's volume incr'd to upper 60s dB average for first 3 responses then began to fade until SLP cue for incr'd loudness and "more effort - be loud." 25 minutes into session pt began drooling. SLP cued pt to close mouth and swallow and pt did so x3 spontaneously: "I'm trying to get all of the juice out, Glendell Docker," and pt smiled.  Pt saw his meds as he put his drink in his walker bag and told SLP he forgot his meds between 2:30 and 3:00 pm. SLP told [name]  this as SLP walked pt out.   05/18/22: Pt arrived today with a mask. De-masked for loud /a/ at average mid 90s dB, and everyday sentences averaged upper 70s dB with independence. In sentence responses [name] averaged WNL volume. However in short conversational segments of 4 minutes x4 pt was able to maintain WNL volume  for no more than 90 seconds, despite SLP providing mod cues occasionally. Noted that pt lost WNL volume more readily as reps progressed, indicating fatigue.  05/16/22: Pt arrives today speaking at mid 60s dB initially for first 4 minutes and then low-mid 60s dB for next 17 minutes in simple conversation with SLP. Loud /a/ average lower 90s dB with rare min A, everyday sentences averaged upper 70s dB with occasional min A. In multi-sentence responses SLP cues for loudness req'd increasing frequency of cues from occasional min to usual min-mod cues due to pt fatigue (ST last today). Pt filled out his CES today with score above.  05/09/22: Pt's loud /a/ average upper 80s dB with rare min A, everyday sentences averaged upper 70s dB with occasional min A. With occasional min-mod A, pt spoke for 15 minutes and measured at low-mid 60s dB; after 10 minutes pt's voice decr'd to low 60s dB. SLP helped pt modify his everyday sentences to make them more current. SLP strongly encouraged pt to use loud /a/ and everyday sentences BID at home.   05/04/22: SLP explained eval results to pt and wife. SLP reiterated pt must practice  loud /a/ and everyday sentences as well as homework with speech hierarchy as directed.    PATIENT EDUCATION: Education details: see "today's treatment" Person educated: Patient and Spouse Education method: Explanation and Demonstration Education comprehension: verbalized understanding  HOME EXERCISE PROGRAM: Loud /a/ and everyday sentences.    GOALS: Goals reviewed with patient? No  SHORT TERM GOALS: Target date: 06/09/22  Produce /a/ with average upper 80s dB over three sessions Baseline:05-16-22, 05-18-22 Goal status: Met  2.  pt will demo abdominal breathing with sentence responses 85% accuracy x 3 sessions  Baseline: 05/25/22, 06/07/22 Goal status: Met  3.  pt will generate 5 minutes simple conversation with low 70s dB average in 3 sessions  Baseline: 06/07/22 Goal status: Ongoing  4.  Pt will use strategies for dysfluency in 1-2 minute short conversational segments  Baseline:  Goal status: Deferred - no difficulty with dysfluency in ST   LONG TERM GOALS: Target date: 07/19/21  pt will produce average upper 60s dB in 10 minutes simple to mod complex conversation in 5 sessions  Baseline:  Goal status: Ongoing  2.  pt will maintain upper 60s dB average in 10 minute simple conversation x 3 sessions  Baseline:  Goal status: Ongoing  3.  pt will use abdominal breathing >80% of the time during 10 minute simple conversation in 3 sessions  Baseline:  Goal status: Ongoing  4.  pt will report fewer requests to repeat himself than prior to ST in 3 sessions  Baseline:  Goal status: Ongoing  5.  Pt will use strategies for dysfluency in 5 minute conversational segments Baseline:  Goal status: Ongoing  6.  Pt will score higher/better on PROM in the last 2 weeks of therapy Baseline:  Goal status: Ongoing  ASSESSMENT:  CLINICAL IMPRESSION: Patient is a 72 y.o. male who was seen today for treatment of dysarthria and communcation deficits in light of Parkinsons Disease. Pt  tells SLP that family members cont to ask him to repeat frequently, Social  situations are still difficult to communicate in due to difficulty being louder when necessary (dart throwing group).    OBJECTIVE IMPAIRMENTS: Objective impairments include executive functioning, aphasia, and dysarthria. These impairments are limiting patient from household responsibilities, ADLs/IADLs, and effectively communicating at home and in community.Factors affecting potential to achieve goals and functional outcome are  none .Marland Kitchen Patient will benefit from skilled SLP services to address above impairments and improve overall function.  REHAB POTENTIAL: Good  PLAN:  SLP FREQUENCY: 2x/week  SLP DURATION: 8 weeks  PLANNED INTERVENTIONS: Environmental controls, Cueing hierachy, Cognitive reorganization, Internal/external aids, Oral motor exercises, Functional tasks, Multimodal communication approach, SLP instruction and feedback, Compensatory strategies, and Patient/family education    [facility], [place] 06/07/2022, 12:44 PM

## 2022-06-07 NOTE — Therapy (Signed)
OUTPATIENT PHYSICAL THERAPY NEURO TREATMENT   Patient Name: Matthew [name] MRN: [id number] DOB:January 09, 1951, 72 y.o., male Today's Date: 03/09/2022   PCP: Lawerance Cruel, MD REFERRING PROVIDER: Carles Collet Eustace Quail, DO     PT End of Session - 06/07/22 1310     Visit Number 20    Number of Visits 22    Date for PT Re-Evaluation 06/20/22    Authorization Type [employer]    Progress Note Due on Visit 26    PT Start Time 1315    PT Stop Time 1400    PT Time Calculation (min) 45 min    Equipment Utilized During Treatment Gait belt    Activity Tolerance Patient tolerated treatment well    Behavior During Therapy WFL for tasks assessed/performed                   Past Medical History:  Diagnosis Date   Anxiety    Arthritis    GERD (gastroesophageal reflux disease)    occ remote history   Hypertension    Parkinson disease    Prostate cancer ([place])    Right knee pain    questionable meniscal tear   Seasonal allergies    Past Surgical History:  Procedure Laterality Date   COLONOSCOPY     Leg Laceration Repair  Left    Age 15   PELVIC LYMPH NODE DISSECTION Bilateral 03/22/2018   Procedure: PELVIC LYMPH NODE DISSECTION;  Surgeon: Ceasar Mons, MD;  Location: [facility];  Service: Urology;  Laterality: Bilateral;   PROSTATE BIOPSY     ROBOT ASSISTED LAPAROSCOPIC RADICAL PROSTATECTOMY N/A 03/22/2018   Procedure: XI ROBOTIC ASSISTED LAPAROSCOPIC PROSTATECTOMY;  Surgeon: Ceasar Mons, MD;  [facility];  Service: Urology;  Laterality: N/A;   TOTAL KNEE ARTHROPLASTY Right 05/05/2019   Procedure: RIGHT TOTAL KNEE ARTHROPLASTY;  Surgeon: Frederik Pear, MD;  Location: [facility];  Service: Orthopedics;  Laterality: Right;   Patient Active Problem List   Diagnosis Date Noted   S/P TKR (total knee replacement), right 05/05/2019   Osteoarthritis of right knee 05/02/2019   Prostate cancer ([place]) 03/22/2018   Malignant neoplasm of prostate ([place])  02/27/2018    ONSET DATE: 2-3 years  REFERRING DIAG: G20.A2 (ICD-10-CM) - Parkinson's disease without dyskinesia, with fluctuating manifestations   THERAPY DIAG:  No diagnosis found.  Rationale for Evaluation and Treatment Rehabilitation  SUBJECTIVE:  SUBJECTIVE STATEMENT: Feeling pretty good, no new issues  Pt accompanied by: self  PERTINENT HISTORY: Parkinson's, right TKR,   PAIN:  Are you having pain? No  PRECAUTIONS: None  WEIGHT BEARING RESTRICTIONS No  FALLS: Has patient fallen in last 6 months? Yes. Number of falls 2-3 , unable to perform fall recovery  LIVING ENVIRONMENT: Lives with: lives with their spouse Lives in: House/apartment Stairs: Yes: Internal: 16 steps; can reach both and External: 2-3 steps; on left going up Has following equipment at home: Walker - 4 wheeled  PLOF: Independent with household mobility with device  PATIENT GOALS "get close to normal as possible"  -Be able to walk without fear of falling -Be able to throw horseshoes/darts -Be able to maintain walking program with trekking poles   OBJECTIVE:   TODAY'S TREATMENT: 06/07/22 Activity Comments  Pt education PD-specific resources in the community  Seated large amplitude movements 2x10 -up, rock, twist, step  Balance -cone taps 2x10 -single leg on Bosu 1x15 sec: cues for upright posture- BUE, single UE, no UE, eyes closed -lateral step over cone 10x -retrowalk 4x25 ft  Motor dual task STS with bean bag toss 2x10          TODAY'S TREATMENT: 05/30/22 Activity Comments  NU-step x 6 min for dynamic warm-up   Push-release all directions for reactive balance Most difficulty w/ retro-LOB  Single arm cable rows 3x10 15#, emphasis on wide BOS, split stance  2MWT 360 ft w/ 5GL, cue of "walk with speed"   Gait training Use of measured distance for number of steps 4x85 ft -380 ft x 2 MWT at end of session           PATIENT EDUCATION: Education details: in pt's HEP notebook-emphasized forward/back stepping as regular exercise.  Discussed/reviewed that for optimal power of lower extremities (with exercises, with exercise classes), he needs UE support  Person educated: Patient Education method: Explanation and Demonstration Education comprehension: verbalized understanding and returned demonstration    HOME EXERCISE PROGRAM Last updated: 04/04/22 Access Code: [email or web address] URL: [email or web address] Date: 04/04/2022 Prepared by: [facility]  Exercises - Goblet Squat with Kettlebell  - 1 x daily - 7 x weekly - 3 sets - 10 reps - Sumo Squat with Dumbbell  - 1 x daily - 7 x weekly - 3 sets - 10 reps - Step Taps on High Step  - 1 x daily - 5 x weekly - 3 sets - 10 reps - Forward and Backward Step Over with Counter Support  - 1 x daily - 5 x weekly - 3 sets - 10 reps    Below measures were taken at time of initial evaluation unless otherwise specified:   DIAGNOSTIC FINDINGS: none recently  COGNITION: Overall cognitive status: Within functional limits for tasks assessed   SENSATION: WFL  COORDINATION: Grossly intact with some deficits in rapid, alternating and divided movement tasks  EDEMA:    MUSCLE TONE: some rigidity noted in BLE   MUSCLE LENGTH: Knee flexion contractures  DTRs:    POSTURE: forward head  LOWER EXTREMITY ROM:     Active  Right Eval Left Eval  Hip flexion    Hip extension    Hip abduction    Hip adduction    Hip internal rotation    Hip external rotation    Knee flexion    Knee extension -10 -10  Ankle dorsiflexion 5 5  Ankle plantarflexion    Ankle inversion  Ankle eversion     (Blank rows = not tested)  LOWER EXTREMITY MMT:    Grossly 4/5 BLE  BED MOBILITY:   NT  TRANSFERS: Assistive device utilized: Quad cane large base and None  Sit to stand: Complete Independence Stand to sit: Complete Independence Chair to chair: Modified independence Floor:  NT , pt report need for total assist at this time  RAMP:  NT  CURB:  Level of Assistance: SBA Assistive device utilized: Environmental consultant - 4 wheeled Curb Comments: increased time for processing and negotiation  STAIRS:  Level of Assistance: Modified independence  Stair Negotiation Technique: Alternating Pattern  with Bilateral Rails  Number of Stairs: 5   Height of Stairs: 4-6"  Comments:   GAIT: Gait pattern: shuffling, decreased stride Distance walked:  Assistive device utilized: Environmental consultant - 4 wheeled Level of assistance: SBA Comments:   FUNCTIONAL TESTs:  5 times sit to stand: 11 sec Timed up and go (TUG): 15 sec Mini-BEST test: 14/28 Gait Speed: 16.28 sec = 2.01 ft/sec  PATIENT SURVEYS:     PATIENT EDUCATION: Education details: assessment findings Person educated: Patient Education method: Explanation Education comprehension: verbalized understanding and needs further education   HOME EXERCISE PROGRAM: TBD    GOALS: Goals reviewed with patient? Yes  SHORT TERM GOALS: Target date: 03/31/2022  Patient will be able to perform home program independently for self-management. Baseline: Added to HEP 03/29/2022 Goal status: IN PROGRESS  2.  Improve safety with mobility per time 12 sec TUG to reduce risk for falls Baseline: 15 sec w/ 4WW Goal status:GOAL NOT MET-15.78 sec at best   LONG TERM GOALS: Target date: 06/20/2022  Improve safety and reduce risk for falls with mobility per sore 22/28 Mini-BEST test Baseline: 14/28; 18/28 04/10/22; (05/09/22) 20/28 Goal status: IN PROGRESS   2.  Demonstrate increased gait speed to 3 ft/sec to improve efficiency with community ambulation  Baseline: 2 ft/sec; 2.19 ft/sec 04/10/22; (05/09/22) 2.10 ft/sec Goal status: IN PROGRESS  04/10/22   3.  Demo supervision level for floor to stand transfers to improve functional mobility and facilitate participation in PD-specific exercise class Baseline: NT, pt reports total assist at this time Goal status: MET 04/10/22  4.  Demonstrate coordination and ability to perform throwing tasks to facilitate participation in leisure activities (horseshoes and darts) Baseline: reports unable to perform Goal status: DEFERRED for OT    ASSESSMENT:  CLINICAL IMPRESSION: Discussed community-resource options for sustained activities. Performance of large amplitude activities with visual mirroring for guide. Dynamic and static balance activities to promote upright posture and facilitate maintained t-spine extension w/ tactlie cues. Good performance with motor multi-task activity albeit with poor precision but good accuracy for throwing task.  Discussed continued sessions vs D/C to home/community program but pt seems ambivalent and it was difficult to ascertain his plan   OBJECTIVE IMPAIRMENTS Abnormal gait, decreased activity tolerance, decreased balance, decreased coordination, decreased mobility, difficulty walking, decreased ROM, decreased strength, impaired flexibility, improper body mechanics, and postural dysfunction.   ACTIVITY LIMITATIONS carrying, lifting, bending, squatting, transfers, self feeding, and locomotion level  PARTICIPATION LIMITATIONS: cleaning, interpersonal relationship, shopping, community activity, yard work, and leisure activities  Oden Age, Time since onset of injury/illness/exacerbation, and 1 comorbidity: PD, TKR  are also affecting patient's functional outcome.   REHAB POTENTIAL: Good  CLINICAL DECISION MAKING: Evolving/moderate complexity  EVALUATION COMPLEXITY: Moderate  PLAN: PT FREQUENCY: 1x/week  PT DURATION: 6 weeks  PLANNED INTERVENTIONS: Therapeutic exercises, Therapeutic activity, Neuromuscular re-education, Balance training,  Gait  training, Patient/Family education, Self Care, Joint mobilization, Stair training, Vestibular training, Canalith repositioning, Orthotic/Fit training, DME instructions, Aquatic Therapy, Dry Needling, Electrical stimulation, Cryotherapy, Moist heat, Taping, and Manual therapy  PLAN FOR NEXT SESSION: Push-release balance, higher level balance activities, strength training  1:10 PM, 06/07/22 M. Sherlyn Lees, PT, DPT Physical Therapist- [place] Office Number: [phone]     [place] [facility] [address] [place], [place] [address] Phone # [phone] Fax # [phone]

## 2022-06-12 ENCOUNTER — Ambulatory Visit: Payer: PPO

## 2022-06-12 DIAGNOSIS — R471 Dysarthria and anarthria: Secondary | ICD-10-CM | POA: Diagnosis not present

## 2022-06-12 DIAGNOSIS — G20A2 Parkinson's disease without dyskinesia, with fluctuations: Secondary | ICD-10-CM

## 2022-06-12 DIAGNOSIS — R2681 Unsteadiness on feet: Secondary | ICD-10-CM

## 2022-06-12 DIAGNOSIS — R2689 Other abnormalities of gait and mobility: Secondary | ICD-10-CM

## 2022-06-12 DIAGNOSIS — R293 Abnormal posture: Secondary | ICD-10-CM

## 2022-06-12 DIAGNOSIS — M6281 Muscle weakness (generalized): Secondary | ICD-10-CM

## 2022-06-12 NOTE — Therapy (Signed)
OUTPATIENT PHYSICAL THERAPY NEURO TREATMENT   Patient Name: Matthew [name] MRN: [id number] DOB:[birth year], 72 y.o., male Today's Date: 03/09/2022   PCP: Lawerance Cruel, MD REFERRING PROVIDER: Carles Collet Eustace Quail, DO     PT End of Session - 06/12/22 1623     Visit Number 21    Number of Visits 22    Date for PT Re-Evaluation 06/20/22    Authorization Type [employer]    Progress Note Due on Visit 26    PT Start Time 1615    PT Stop Time 1700    PT Time Calculation (min) 45 min    Equipment Utilized During Treatment Gait belt    Activity Tolerance Patient tolerated treatment well    Behavior During Therapy WFL for tasks assessed/performed                   Past Medical History:  Diagnosis Date   Anxiety    Arthritis    GERD (gastroesophageal reflux disease)    occ remote history   Hypertension    Parkinson disease    Prostate cancer ([place])    Right knee pain    questionable meniscal tear   Seasonal allergies    Past Surgical History:  Procedure Laterality Date   COLONOSCOPY     Leg Laceration Repair  Left    Age 81   PELVIC LYMPH NODE DISSECTION Bilateral 03/22/2018   Procedure: PELVIC LYMPH NODE DISSECTION;  Surgeon: Ceasar Mons, MD;  Location: [facility];  Service: Urology;  Laterality: Bilateral;   PROSTATE BIOPSY     ROBOT ASSISTED LAPAROSCOPIC RADICAL PROSTATECTOMY N/A 03/22/2018   Procedure: XI ROBOTIC ASSISTED LAPAROSCOPIC PROSTATECTOMY;  Surgeon: Ceasar Mons, MD;  Location: [facility];  Service: Urology;  Laterality: N/A;   TOTAL KNEE ARTHROPLASTY Right 05/05/2019   Procedure: RIGHT TOTAL KNEE ARTHROPLASTY;  Surgeon: Frederik Pear, MD;  Location: [facility];  Service: Orthopedics;  Laterality: Right;   Patient Active Problem List   Diagnosis Date Noted   S/P TKR (total knee replacement), right 05/05/2019   Osteoarthritis of right knee 05/02/2019   Prostate cancer ([place]) 03/22/2018   Malignant neoplasm of prostate ([place])  02/27/2018    ONSET DATE: 2-3 years  REFERRING DIAG: G20.A2 (ICD-10-CM) - Parkinson's disease without dyskinesia, with fluctuating manifestations   THERAPY DIAG:  Muscle weakness (generalized)  Abnormal posture  Unsteadiness on feet  Other abnormalities of gait and mobility  Parkinson's disease without dyskinesia, with fluctuating manifestations  Rationale for Evaluation and Treatment Rehabilitation  SUBJECTIVE:  SUBJECTIVE STATEMENT: Having some mid back pain/discomfort. Went walking for 30 min with friend in neighborhood and it was VERY tiring  Pt accompanied by: self  PERTINENT HISTORY: Parkinson's, right TKR,   PAIN:  Are you having pain? No and Yes: NPRS scale: 5/10 Pain location: mid-thoracic Pain description: aching Aggravating factors: ADL like drying off w/ towel Relieving factors: sitting/laying with back support  PRECAUTIONS: None  WEIGHT BEARING RESTRICTIONS No  FALLS: Has patient fallen in last 6 months? Yes. Number of falls 2-3 , unable to perform fall recovery  LIVING ENVIRONMENT: Lives with: lives with their spouse Lives in: House/apartment Stairs: Yes: Internal: 16 steps; can reach both and External: 2-3 steps; on left going up Has following equipment at home: Walker - 4 wheeled  PLOF: Independent with household mobility with device  PATIENT GOALS "get close to normal as possible"  -Be able to walk without fear of falling -Be able to throw horseshoes/darts -Be able to maintain walking program with trekking poles   OBJECTIVE:   TODAY'S TREATMENT: 06/12/22 Activity Comments  UBE x 6 min For alternating movement coordination for arm swing in gait  Manual therapy -T-spine PA grade 2-3 for improved thoracic extension and reduced pain. -soft-tissue  mobilization: t-spine to address trigger points along rhomboids, middle trap and erectors.    Postural training -bilat UE external rotation in neutral with green t-band -cues in posture verbal/visual/tactile              TODAY'S TREATMENT: 06/07/22 Activity Comments  Pt education PD-specific resources in the community  Seated large amplitude movements 2x10 -up, rock, twist, step  Balance -cone taps 2x10 -single leg on Bosu 1x15 sec: cues for upright posture- BUE, single UE, no UE, eyes closed -lateral step over cone 10x -retrowalk 4x25 ft  Motor dual task STS with bean bag toss 2x10          TODAY'S TREATMENT: 05/30/22 Activity Comments  NU-step x 6 min for dynamic warm-up   Push-release all directions for reactive balance Most difficulty w/ retro-LOB  Single arm cable rows 3x10 15#, emphasis on wide BOS, split stance  2MWT 360 ft w/ 2IN, cue of "walk with speed"  Gait training Use of measured distance for number of steps 4x85 ft -380 ft x 2 MWT at end of session           PATIENT EDUCATION: Education details: in pt's HEP notebook-emphasized forward/back stepping as regular exercise.  Discussed/reviewed that for optimal power of lower extremities (with exercises, with exercise classes), he needs UE support  Person educated: Patient Education method: Explanation and Demonstration Education comprehension: verbalized understanding and returned demonstration    HOME EXERCISE PROGRAM Last updated: 04/04/22 Access Code: [email or web address] URL: [email or web address] Date: 04/04/2022 Prepared by: [facility]  Exercises - Goblet Squat with Kettlebell  - 1 x daily - 7 x weekly - 3 sets - 10 reps - Sumo Squat with Dumbbell  - 1 x daily - 7 x weekly - 3 sets - 10 reps - Step Taps on High Step  - 1 x daily - 5 x weekly - 3 sets - 10 reps - Forward and Backward Step Over with Counter Support  - 1 x daily - 5 x weekly - 3 sets - 10  reps    Below measures were taken at time of initial evaluation unless otherwise specified:   DIAGNOSTIC FINDINGS: none recently  COGNITION: Overall cognitive status: Within functional limits for tasks  assessed   SENSATION: WFL  COORDINATION: Grossly intact with some deficits in rapid, alternating and divided movement tasks  EDEMA:    MUSCLE TONE: some rigidity noted in BLE   MUSCLE LENGTH: Knee flexion contractures  DTRs:    POSTURE: forward head  LOWER EXTREMITY ROM:     Active  Right Eval Left Eval  Hip flexion    Hip extension    Hip abduction    Hip adduction    Hip internal rotation    Hip external rotation    Knee flexion    Knee extension -10 -10  Ankle dorsiflexion 5 5  Ankle plantarflexion    Ankle inversion    Ankle eversion     (Blank rows = not tested)  LOWER EXTREMITY MMT:    Grossly 4/5 BLE  BED MOBILITY:  NT  TRANSFERS: Assistive device utilized: Quad cane large base and None  Sit to stand: Complete Independence Stand to sit: Complete Independence Chair to chair: Modified independence Floor:  NT , pt report need for total assist at this time  RAMP:  NT  CURB:  Level of Assistance: SBA Assistive device utilized: Environmental consultant - 4 wheeled Curb Comments: increased time for processing and negotiation  STAIRS:  Level of Assistance: Modified Artist Technique: Alternating Pattern  with Bilateral Rails  Number of Stairs: 5   Height of Stairs: 4-6"  Comments:   GAIT: Gait pattern: shuffling, decreased stride Distance walked:  Assistive device utilized: Environmental consultant - 4 wheeled Level of assistance: SBA Comments:   FUNCTIONAL TESTs:  5 times sit to stand: 11 sec Timed up and go (TUG): 15 sec Mini-BEST test: 14/28 Gait Speed: 16.28 sec = 2.01 ft/sec  PATIENT SURVEYS:     PATIENT EDUCATION: Education details: assessment findings Person educated: Patient Education method: Explanation Education  comprehension: verbalized understanding and needs further education   HOME EXERCISE PROGRAM: TBD    GOALS: Goals reviewed with patient? Yes  SHORT TERM GOALS: Target date: 03/31/2022  Patient will be able to perform home program independently for self-management. Baseline: Added to HEP 03/29/2022 Goal status: IN PROGRESS  2.  Improve safety with mobility per time 12 sec TUG to reduce risk for falls Baseline: 15 sec w/ 4WW Goal status:GOAL NOT MET-15.78 sec at best   LONG TERM GOALS: Target date: 06/20/2022  Improve safety and reduce risk for falls with mobility per sore 22/28 Mini-BEST test Baseline: 14/28; 18/28 04/10/22; (05/09/22) 20/28 Goal status: IN PROGRESS   2.  Demonstrate increased gait speed to 3 ft/sec to improve efficiency with community ambulation  Baseline: 2 ft/sec; 2.19 ft/sec 04/10/22; (05/09/22) 2.10 ft/sec Goal status: IN PROGRESS 04/10/22   3.  Demo supervision level for floor to stand transfers to improve functional mobility and facilitate participation in PD-specific exercise class Baseline: NT, pt reports total assist at this time Goal status: MET 04/10/22  4.  Demonstrate coordination and ability to perform throwing tasks to facilitate participation in leisure activities (horseshoes and darts) Baseline: reports unable to perform Goal status: DEFERRED for OT    ASSESSMENT:  CLINICAL IMPRESSION: Reduced back pain after manual therapy. Followed this with postural exercises and reinforcement to promote t-spine extension. Will re-assess at next session for D/C vs continuation  OBJECTIVE IMPAIRMENTS Abnormal gait, decreased activity tolerance, decreased balance, decreased coordination, decreased mobility, difficulty walking, decreased ROM, decreased strength, impaired flexibility, improper body mechanics, and postural dysfunction.   ACTIVITY LIMITATIONS carrying, lifting, bending, squatting, transfers, self feeding, and locomotion  level  PARTICIPATION LIMITATIONS: cleaning, interpersonal relationship, shopping, community activity, yard work, and leisure activities  [place] Age, Time since onset of injury/illness/exacerbation, and 1 comorbidity: PD, TKR  are also affecting patient's functional outcome.   REHAB POTENTIAL: Good  CLINICAL DECISION MAKING: Evolving/moderate complexity  EVALUATION COMPLEXITY: Moderate  PLAN: PT FREQUENCY: 1x/week  PT DURATION: 6 weeks  PLANNED INTERVENTIONS: Therapeutic exercises, Therapeutic activity, Neuromuscular re-education, Balance training, Gait training, Patient/Family education, Self Care, Joint mobilization, Stair training, Vestibular training, Canalith repositioning, Orthotic/Fit training, DME instructions, Aquatic Therapy, Dry Needling, Electrical stimulation, Cryotherapy, Moist heat, Taping, and Manual therapy  PLAN FOR NEXT SESSION: D/C vs re-cert?  9:73 PM, 53/29/92 M. Sherlyn Lees, PT, DPT Physical Therapist- [place] Office Number: [phone]     [place] [facility] [address] [place], [place] [address] Phone # [phone] Fax # [phone]

## 2022-06-12 NOTE — Progress Notes (Signed)
Assessment/Plan:   1.  Parkinsons Disease  -Continue carbidopa/levodopa 25/100, 2 tablets at 9 AM/noon/3 PM/6 PM.  He can take two extra 1/2 tablets in the day if needed.  -Continue carbidopa/levodopa 50/200 CR at bedtime.  He goes to bed around 10:30-11pm.    -Samples of Inbrija were given to use in place of his restore gold.  Explained how to use that and explained that he could use that up to 5 times per day in addition to his levodopa.  -Patient knows that I do not care for the restore gold, but he has apparently had trouble getting off of it.   2.  Sialorrhea  -Using Myobloc.  Has upcoming appointment in February.  3.  Dysphagia  -Modified barium swallow done December 22, 2021 was unremarkable.  -discussed meds in applesauce or ice cream   Subjective:   Matthew [name] was seen today in follow up for Parkinsons disease.  My previous records were reviewed prior to todays visit as well as outside records available to me.  Wife with patient and supplements hx. I saw the patient at the end of November at which time the biggest complaint was drowsiness with falls.  I stopped his entacapone.  I asked him to stop the restore gold.  I gave him samples of Inbrija to use in place of the restore gold.  He isn't taking this curently.  He states that it may have helped for 30 min and then "not."  He trialed it for about a month but states that he didn't really have "off" so wasn't sure when to take it.  They do think that getting off of entacapone helped him (meaning he is not teary).  They stated that they tried to get off of restore gold but he could not move well so he went back on and he is taking 6 per day (was up to 16/day).   He has been in physical, occupational and speech therapies since our last visit and those notes are reviewed.  He is functioniong "reasonably well."  He hasn't had a fall in about a month.  No hallucinations.  Sometimes has trouble getting levodopa swallowed.  He asks about  his upcoming [place] appointment.  He definitely thinks the Myobloc helps, but it does wear off early.   Current prescribed movement disorder medications: carbidopa/levodopa 25/100, 2 tablets at 9am/12noon/3pm/6pm. Carbidopa/levodopa 50/200 CR at bed   PREVIOUS MEDICATIONS: Requip (headache, drowsiness, incontinence at only 0.25 mg dose); rotigotine (feet/leg swelling); pramipexole (reported feet/ankles swelling at low-dose); selegeline (they thought it caused dizziness); entacapone (I discontinued when patient complained about sleepiness); inbrija (tried but didn't think helped);  ALLERGIES:   Allergies  Allergen Reactions   Other     Senior dose of flu shot, cause hear palpitations, anxious   Peanut-Containing Drug Products Other (See Comments)    Nasal congestion   Ropinirole Other (See Comments)    Headache drowsiness trouble urinating     CURRENT MEDICATIONS:  Outpatient Encounter Medications as of 06/13/2022  Medication Sig   Ascorbic Acid (VITAMIN C) 1000 MG tablet Take 1,000 mg by mouth daily.   Botulinum Toxin Type B (MYOBLOC) 5000 UNIT/ML SOLN Inject 5000 units into the face and neck every 120 days by the doctor   calcium carbonate (OS-CAL) 1250 (500 Ca) MG chewable tablet Chew 1 tablet by mouth daily.   carbidopa-levodopa (SINEMET CR) 50-200 MG tablet TAKE ONE TABLET BY MOUTH EVERYDAY AT BEDTIME   carbidopa-levodopa (SINEMET IR) 25-100 MG  tablet TAKE TWO TABLETS BY MOUTH FOUR TIMES DAILY   carbidopa-levodopa (SINEMET IR) 25-100 MG tablet Take 1 tablet by mouth 4 (four) times daily.   docusate sodium (COLACE) 100 MG capsule Take 100 mg by mouth 2 (two) times daily.   fluticasone (FLONASE) 50 MCG/ACT nasal spray Place 1-2 sprays into both nostrils daily as needed for allergies or rhinitis.   loratadine (CLARITIN) 10 MG tablet Take 10 mg by mouth daily.   mirabegron ER (MYRBETRIQ) 50 MG TB24 tablet Take 50 mg by mouth daily.   Multiple Vitamin (MULTIVITAMIN) tablet Take 1  tablet by mouth daily.   OVER THE COUNTER MEDICATION Take 4-6 tablets by mouth in the morning, at noon, and at bedtime. RESTORE GOLD 6 tablet in am, 6 tab at lunch and 4 tablet in evening   vitamin B-12 (CYANOCOBALAMIN) 500 MCG tablet Take 500 mcg by mouth daily.   zinc gluconate 50 MG tablet Take 50 mg by mouth daily.   Levodopa (INBRIJA) 42 MG CAPS Samples of this drug were given to the patient, quantity 1, Lot Number [id number] EXP 04/2023 (Patient not taking: Reported on 06/13/2022)   [DISCONTINUED] selegiline (ELDEPRYL) 5 MG tablet Take 1 tablet (5 mg total) by mouth 2 (two) times daily with a meal. 9am and noon (Patient not taking: Reported on 06/13/2022)   No facility-administered encounter medications on file as of 06/13/2022.    Objective:   PHYSICAL EXAMINATION:    VITALS:   Vitals:   06/13/22 1439  BP: 122/72  Pulse: 63  SpO2: 98%  Weight: 155 lb 9.6 oz (70.6 kg)  Height: '5\' 6"'$  (1.676 m)     GEN:  The patient appears stated age and is in NAD.  Has some emotional lability. HEENT:  Normocephalic, atraumatic.  The mucous membranes are moist. The superficial temporal arteries are without ropiness or tenderness.  No drooling at all.  Neurological examination:  Orientation: The patient is alert and oriented x3. Cranial nerves: There is good facial symmetry with facial hypomimia. The speech is fluent and clear. Soft palate rises symmetrically and there is no tongue deviation. Hearing is intact to conversational tone. Sensation: Sensation is intact to light touch throughout Motor: Strength is at least antigravity x4.  Movement examination: Tone: There is mild increased tone in the RUE>LUE (he is due for medication) Abnormal movements: none, even with distraction procedures Coordination:  There is decremation with finger taps bilaterally and hand opening and closing bilaterally.   Gait and Station: The patient arises slowly without the use of the hands.  He ambulates well with  the walker but he is festinating.  I have reviewed and interpreted the following labs independently    Chemistry      Component Value Date/Time   NA 135 05/06/2019 0230   K 4.0 05/06/2019 0230   CL 104 05/06/2019 0230   CO2 24 05/06/2019 0230   BUN 22 05/06/2019 0230   CREATININE 0.98 05/06/2019 0230      Component Value Date/Time   CALCIUM 8.7 (L) 05/06/2019 0230       Lab Results  Component Value Date   WBC 9.6 05/06/2019   HGB 9.9 (L) 05/06/2019   HCT 28.9 (L) 05/06/2019   MCV 94.4 05/06/2019   PLT 141 (L) 05/06/2019    No results found for: "TSH"   Total time spent on today's visit was 32 minutes, including both face-to-face time and nonface-to-face time.  Time included that spent on review of records (prior notes available  to me/labs/imaging if pertinent), discussing treatment and goals, answering patient's questions and coordinating care.  Cc:  Lawerance Cruel, MD

## 2022-06-13 ENCOUNTER — Other Ambulatory Visit: Payer: Self-pay | Admitting: Neurology

## 2022-06-13 ENCOUNTER — Ambulatory Visit: Payer: PPO | Admitting: Neurology

## 2022-06-13 ENCOUNTER — Other Ambulatory Visit (HOSPITAL_COMMUNITY): Payer: Self-pay

## 2022-06-13 ENCOUNTER — Encounter: Payer: Self-pay | Admitting: Neurology

## 2022-06-13 VITALS — BP 122/72 | HR 63 | Ht 66.0 in | Wt 155.6 lb

## 2022-06-13 DIAGNOSIS — G20A2 Parkinson's disease without dyskinesia, with fluctuations: Secondary | ICD-10-CM | POA: Diagnosis not present

## 2022-06-13 DIAGNOSIS — R1319 Other dysphagia: Secondary | ICD-10-CM | POA: Diagnosis not present

## 2022-06-13 DIAGNOSIS — K117 Disturbances of salivary secretion: Secondary | ICD-10-CM

## 2022-06-13 MED ORDER — CARBIDOPA-LEVODOPA 25-100 MG PO TABS: ORAL_TABLET | ORAL | 1 refills | Status: DC

## 2022-06-13 MED ORDER — CARBIDOPA-LEVODOPA ER 50-200 MG PO TBCR: EXTENDED_RELEASE_TABLET | ORAL | 1 refills | Status: DC

## 2022-06-13 MED ORDER — MYOBLOC 5000 UNIT/ML IM SOLN
INTRAMUSCULAR | 0 refills | Status: DC
  Filled 2022-06-13: qty 1, 100d supply, fill #0

## 2022-06-13 NOTE — Patient Instructions (Signed)
You can take two extra 1/2 tablets in the day if needed.

## 2022-06-14 ENCOUNTER — Ambulatory Visit: Payer: PPO

## 2022-06-14 DIAGNOSIS — G20A2 Parkinson's disease without dyskinesia, with fluctuations: Secondary | ICD-10-CM

## 2022-06-14 DIAGNOSIS — R2681 Unsteadiness on feet: Secondary | ICD-10-CM

## 2022-06-14 DIAGNOSIS — M6281 Muscle weakness (generalized): Secondary | ICD-10-CM

## 2022-06-14 DIAGNOSIS — R2689 Other abnormalities of gait and mobility: Secondary | ICD-10-CM

## 2022-06-14 DIAGNOSIS — R293 Abnormal posture: Secondary | ICD-10-CM

## 2022-06-14 DIAGNOSIS — R471 Dysarthria and anarthria: Secondary | ICD-10-CM | POA: Diagnosis not present

## 2022-06-14 NOTE — Therapy (Signed)
OUTPATIENT PHYSICAL THERAPY NEURO TREATMENT and D/C Summary   Patient Name: Matthew [name] MRN: [id number] DOB:1950/06/09, 72 y.o., male Today's Date: 03/09/2022   PCP: Lawerance Cruel, MD REFERRING PROVIDER: Tat, Eustace Quail, DO   PHYSICAL THERAPY DISCHARGE SUMMARY  Visits from Start of Care: 22  Current functional level related to goals / functional outcomes: See STG/LTG   Remaining deficits: Motor planning/control, balance, high risk for falls   Education / Equipment: HEP, community classes/resources   Patient agrees to discharge. Patient goals were partially met. Patient is being discharged due to being pleased with the current functional level.    PT End of Session - 06/14/22 1622     Visit Number 22    Number of Visits 22    Date for PT Re-Evaluation 06/20/22    Authorization Type [employer]    Progress Note Due on Visit 26    PT Start Time 1615    PT Stop Time 1700    PT Time Calculation (min) 45 min    Equipment Utilized During Treatment Gait belt    Activity Tolerance Patient tolerated treatment well    Behavior During Therapy WFL for tasks assessed/performed                   Past Medical History:  Diagnosis Date   Anxiety    Arthritis    GERD (gastroesophageal reflux disease)    occ remote history   Hypertension    Parkinson disease    Prostate cancer ([place])    Right knee pain    questionable meniscal tear   Seasonal allergies    Past Surgical History:  Procedure Laterality Date   COLONOSCOPY     Leg Laceration Repair  Left    Age 74   PELVIC LYMPH NODE DISSECTION Bilateral 03/22/2018   Procedure: PELVIC LYMPH NODE DISSECTION;  Surgeon: Ceasar Mons, MD;  Location: [facility];  Service: Urology;  Laterality: Bilateral;   PROSTATE BIOPSY     ROBOT ASSISTED LAPAROSCOPIC RADICAL PROSTATECTOMY N/A 03/22/2018   Procedure: XI ROBOTIC ASSISTED LAPAROSCOPIC PROSTATECTOMY;  Surgeon: Ceasar Mons, MD;  Location:  [facility];  Service: Urology;  Laterality: N/A;   TOTAL KNEE ARTHROPLASTY Right 05/05/2019   Procedure: RIGHT TOTAL KNEE ARTHROPLASTY;  Surgeon: Frederik Pear, MD;  Location: [facility];  Service: Orthopedics;  Laterality: Right;   Patient Active Problem List   Diagnosis Date Noted   S/P TKR (total knee replacement), right 05/05/2019   Osteoarthritis of right knee 05/02/2019   Prostate cancer ([place]) 03/22/2018   Malignant neoplasm of prostate ([place]) 02/27/2018    ONSET DATE: 2-3 years  REFERRING DIAG: G20.A2 (ICD-10-CM) - Parkinson's disease without dyskinesia, with fluctuating manifestations   THERAPY DIAG:  Muscle weakness (generalized)  Abnormal posture  Unsteadiness on feet  Other abnormalities of gait and mobility  Parkinson's disease without dyskinesia, with fluctuating manifestations  Rationale for Evaluation and Treatment Rehabilitation  SUBJECTIVE:  SUBJECTIVE STATEMENT: No new issues, investigating cycling at [facility]  Pt accompanied by: self  PERTINENT HISTORY: Parkinson's, right TKR,   PAIN:  Are you having pain? No and Yes: NPRS scale: 5/10 Pain location: mid-thoracic Pain description: aching Aggravating factors: ADL like drying off w/ towel Relieving factors: sitting/laying with back support  PRECAUTIONS: None  WEIGHT BEARING RESTRICTIONS No  FALLS: Has patient fallen in last 6 months? Yes. Number of falls 2-3 , unable to perform fall recovery  LIVING ENVIRONMENT: Lives with: lives with their spouse Lives in: House/apartment Stairs: Yes: Internal: 16 steps; can reach both and External: 2-3 steps; on left going up Has following equipment at home: Walker - 4 wheeled  PLOF: Independent with household mobility with device  PATIENT GOALS "get close to normal as possible"  -Be  able to walk without fear of falling -Be able to throw horseshoes/darts -Be able to maintain walking program with trekking poles   OBJECTIVE:   TODAY'S TREATMENT: 06/14/22 Activity Comments  Pt education Bed mobility and related devices  Mini-BEST test 20/28                 TODAY'S TREATMENT: 06/12/22 Activity Comments  UBE x 6 min For alternating movement coordination for arm swing in gait  Manual therapy -T-spine PA grade 2-3 for improved thoracic extension and reduced pain. -soft-tissue mobilization: t-spine to address trigger points along rhomboids, middle trap and erectors.    Postural training -bilat UE external rotation in neutral with green t-band -cues in posture verbal/visual/tactile              TODAY'S TREATMENT: 06/07/22 Activity Comments  Pt education PD-specific resources in the community  Seated large amplitude movements 2x10 -up, rock, twist, step  Balance -cone taps 2x10 -single leg on Bosu 1x15 sec: cues for upright posture- BUE, single UE, no UE, eyes closed -lateral step over cone 10x -retrowalk 4x25 ft  Motor dual task STS with bean bag toss 2x10             PATIENT EDUCATION: Education details: in pt's HEP notebook-emphasized forward/back stepping as regular exercise.  Discussed/reviewed that for optimal power of lower extremities (with exercises, with exercise classes), he needs UE support  Person educated: Patient Education method: Explanation and Demonstration Education comprehension: verbalized understanding and returned demonstration    HOME EXERCISE PROGRAM Last updated: 04/04/22 Access Code: [email or web address] URL: [email or web address] Date: 04/04/2022 Prepared by: Hood with Kettlebell  - 1 x daily - 7 x weekly - 3 sets - 10 reps - Sumo Squat with Dumbbell  - 1 x daily - 7 x weekly - 3 sets - 10 reps - Step Taps on High Step  - 1 x daily - 5 x weekly - 3  sets - 10 reps - Forward and Backward Step Over with Counter Support  - 1 x daily - 5 x weekly - 3 sets - 10 reps    Below measures were taken at time of initial evaluation unless otherwise specified:   DIAGNOSTIC FINDINGS: none recently  COGNITION: Overall cognitive status: Within functional limits for tasks assessed   SENSATION: WFL  COORDINATION: Grossly intact with some deficits in rapid, alternating and divided movement tasks  EDEMA:    MUSCLE TONE: some rigidity noted in BLE   MUSCLE LENGTH: Knee flexion contractures  DTRs:    POSTURE: forward head  LOWER EXTREMITY ROM:     Active  Right  Eval Left Eval  Hip flexion    Hip extension    Hip abduction    Hip adduction    Hip internal rotation    Hip external rotation    Knee flexion    Knee extension -10 -10  Ankle dorsiflexion 5 5  Ankle plantarflexion    Ankle inversion    Ankle eversion     (Blank rows = not tested)  LOWER EXTREMITY MMT:    Grossly 4/5 BLE  BED MOBILITY:  NT  TRANSFERS: Assistive device utilized: Quad cane large base and None  Sit to stand: Complete Independence Stand to sit: Complete Independence Chair to chair: Modified independence Floor:  NT , pt report need for total assist at this time  RAMP:  NT  CURB:  Level of Assistance: SBA Assistive device utilized: Environmental consultant - 4 wheeled Curb Comments: increased time for processing and negotiation  STAIRS:  Level of Assistance: Modified Artist Technique: Alternating Pattern  with Bilateral Rails  Number of Stairs: 5   Height of Stairs: 4-6"  Comments:   GAIT: Gait pattern: shuffling, decreased stride Distance walked:  Assistive device utilized: Environmental consultant - 4 wheeled Level of assistance: SBA Comments:   FUNCTIONAL TESTs:  5 times sit to stand: 11 sec Timed up and go (TUG): 15 sec Mini-BEST test: 14/28 Gait Speed: 16.28 sec = 2.01 ft/sec  PATIENT SURVEYS:     PATIENT  EDUCATION: Education details: assessment findings Person educated: Patient Education method: Explanation Education comprehension: verbalized understanding and needs further education   HOME EXERCISE PROGRAM: TBD    GOALS: Goals reviewed with patient? Yes  SHORT TERM GOALS: Target date: 03/31/2022  Patient will be able to perform home program independently for self-management. Baseline: Added to HEP 03/29/2022 Goal status: MET  2.  Improve safety with mobility per time 12 sec TUG to reduce risk for falls Baseline: 15 sec w/ 4WW Goal status:GOAL NOT MET-15.78 sec at best   LONG TERM GOALS: Target date: 06/20/2022  Improve safety and reduce risk for falls with mobility per sore 22/28 Mini-BEST test Baseline: 14/28; 18/28 04/10/22; (05/09/22) 20/28 Goal status: NOT MET   2.  Demonstrate increased gait speed to 3 ft/sec to improve efficiency with community ambulation  Baseline: 2 ft/sec; 2.19 ft/sec 04/10/22; (06/14/22) 2.10 ft/sec Goal status: NOT MET 04/10/22   3.  Demo supervision level for floor to stand transfers to improve functional mobility and facilitate participation in PD-specific exercise class Baseline: NT, pt reports total assist at this time Goal status: NOT MET 04/10/22  4.  Demonstrate coordination and ability to perform throwing tasks to facilitate participation in leisure activities (horseshoes and darts) Baseline: reports unable to perform Goal status: DEFERRED for OT    ASSESSMENT:  CLINICAL IMPRESSION: Discussion with pt/spouse regarding bed mobility techqniues and implemntation of PWR moves and relevant DME use to promote independence and initiation. Review of STG/LTG with scores reminiscent of last performance at recertification and high risk for falls per mini-BEST test of 20/28.  Encouraged continued activity via community classes and specifically endorsed cycling for the benefit of cardiovascular intensity that this mode would afford. Discussed POC  details with pt and spouse and recommend screen in 6 months or refer to MD if issues/functional decline occurs.  OBJECTIVE IMPAIRMENTS Abnormal gait, decreased activity tolerance, decreased balance, decreased coordination, decreased mobility, difficulty walking, decreased ROM, decreased strength, impaired flexibility, improper body mechanics, and postural dysfunction.   ACTIVITY LIMITATIONS carrying, lifting, bending, squatting, transfers, self feeding, and  locomotion level  PARTICIPATION LIMITATIONS: cleaning, interpersonal relationship, shopping, community activity, yard work, and leisure activities  [place] Age, Time since onset of injury/illness/exacerbation, and 1 comorbidity: PD, TKR  are also affecting patient's functional outcome.   REHAB POTENTIAL: Good  CLINICAL DECISION MAKING: Evolving/moderate complexity  EVALUATION COMPLEXITY: Moderate  PLAN: PT FREQUENCY: 1x/week  PT DURATION: 6 weeks  PLANNED INTERVENTIONS: Therapeutic exercises, Therapeutic activity, Neuromuscular re-education, Balance training, Gait training, Patient/Family education, Self Care, Joint mobilization, Stair training, Vestibular training, Canalith repositioning, Orthotic/Fit training, DME instructions, Aquatic Therapy, Dry Needling, Electrical stimulation, Cryotherapy, Moist heat, Taping, and Manual therapy  PLAN FOR NEXT SESSION: D/C  4:22 PM, 06/14/22 M. Sherlyn Lees, PT, DPT Physical Therapist- [place] Office Number: [phone]     [place] [facility] [address] [place], [place] [address] Phone # [phone] Fax # [phone]

## 2022-06-19 ENCOUNTER — Ambulatory Visit: Payer: PPO

## 2022-06-19 DIAGNOSIS — R471 Dysarthria and anarthria: Secondary | ICD-10-CM

## 2022-06-19 DIAGNOSIS — R4701 Aphasia: Secondary | ICD-10-CM

## 2022-06-19 DIAGNOSIS — R41841 Cognitive communication deficit: Secondary | ICD-10-CM

## 2022-06-19 NOTE — Therapy (Signed)
OUTPATIENT SPEECH LANGUAGE PATHOLOGY PARKINSON'S TREATMENT   Patient Name: Matthew [name] MRN: [id number] DOB:1951-02-21, 72 y.o., male Today's Date: 06/19/2022  PCP: Lona Kettle, MD REFERRING PROVIDER: Alonza Bogus, DO  END OF SESSION:  End of Session - 06/19/22 1728     Visit Number 9    Number of Visits 17    Date for SLP Re-Evaluation 07/19/22    SLP Start Time 1535    SLP Stop Time  5188    SLP Time Calculation (min) 40 min    Activity Tolerance Patient tolerated treatment well                   Past Medical History:  Diagnosis Date   Anxiety    Arthritis    GERD (gastroesophageal reflux disease)    occ remote history   Hypertension    Parkinson disease    Prostate cancer ([place])    Right knee pain    questionable meniscal tear   Seasonal allergies    Past Surgical History:  Procedure Laterality Date   COLONOSCOPY     Leg Laceration Repair  Left    Age 52   PELVIC LYMPH NODE DISSECTION Bilateral 03/22/2018   Procedure: PELVIC LYMPH NODE DISSECTION;  Surgeon: Ceasar Mons, MD;  Location: [facility];  Service: Urology;  Laterality: Bilateral;   PROSTATE BIOPSY     ROBOT ASSISTED LAPAROSCOPIC RADICAL PROSTATECTOMY N/A 03/22/2018   Procedure: XI ROBOTIC ASSISTED LAPAROSCOPIC PROSTATECTOMY;  Surgeon: Ceasar Mons, MD;  Location: [facility];  Service: Urology;  Laterality: N/A;   TOTAL KNEE ARTHROPLASTY Right 05/05/2019   Procedure: RIGHT TOTAL KNEE ARTHROPLASTY;  Surgeon: Frederik Pear, MD;  Location: [facility];  Service: Orthopedics;  Laterality: Right;   Patient Active Problem List   Diagnosis Date Noted   S/P TKR (total knee replacement), right 05/05/2019   Osteoarthritis of right knee 05/02/2019   Prostate cancer ([place]) 03/22/2018   Malignant neoplasm of prostate ([place]) 02/27/2018    ONSET DATE: script dated 03-02-22  REFERRING DIAG: Parkinson's Disease  THERAPY DIAG:  Dysarthria and anarthria  Aphasia  Cognitive communication  deficit  Rationale for Evaluation and Treatment: Rehabilitation  SUBJECTIVE:   SUBJECTIVE STATEMENT: "My sentences didn't go to well this morning." Pt accompanied by: self  PERTINENT HISTORY: Pt well-known to this SLP from previous courses of ST.  PAIN:  Are you having pain? No  FALLS: Has patient fallen in last 6 months?  See PT evaluation for details  PATIENT GOALS: Improve loudness and fluency  OBJECTIVE:   DIAGNOSTIC FINDINGS:  MBS 12-22-21 Patient presents with functional oropharyngeal swallowing without aspiration of any consistency tested. He does demonstrate minimal asymptomatic laryngeal penetration of thin liquid due to decreased timing of laryngeal closure. Chin tuck posture with use of straw effective to prevent penetration. Pharyngeal swallow is strong without retention. Oral transiting discoordination noted with pt swallowing barium tablet as he required 2nd bolus of thin to transit through oropharynx. Upon esophageal sweep, pt appeared with barium tablet retained in esophagus with pt awareness. Tablet appeared to transit distally after several boluses of liquids. After tablet had transited, pt continued with "phantom" sensation to esophagus. Recommend pt continue diet as tolerated, using chin tuck with liquids if improves comfort, decreases cough. Also encouraged pt to assure maintains strength of voice, cough and expectoration for airway protection.   Marland Kitchen  PATIENT REPORTED OUTCOME MEASURES (PROM): Communication Effectiveness Survey: Completed today with score 20/40 (higher scores indicate better or more effective communication. "2"  was scored with conversaing over the phone, speaking to a friend when upset/angry, and having a conversation with someone at a distance.  TODAY'S TREATMENT:                                                                                                                                         DATE:  06/19/22: Pt with speech volume average low-mid  60s dB. Loud /a/ averaged mid-90s dB with cues to mitigate loudness decay, and everyday sentences averaged mid 80s dB with initial cues for AB. Pt provided sentence responses with mid 60s dB to upper 60s dB after SLP cued pt for breath support and occasional mod cues for loudness.  SLP encouraged pt to cont to perform HEP BID.   06/06/22: Pt arrived with speech volume average mid-upper 60s dB. Loud /a/ averaged mid-90s dB, and everyday sentences averaged mis-upper 80s dB with initial cues for AB. In simple-mod complex semi-structured sentence responses pt maintained avergae loudness with AB upper 60s dB. In conversational segments pt averaged mid-upper 60s dB with rare min cues for AB.  05/30/22: Pt arrived with suboptimal speech volume. Loud /a/ averaged mid-90s dB, and everyday sentences averaged mis-upper 80s dB with initial cues for AB. In short conversational segments of 6 minutes x4, pt generated WNL volume for the first segment, and then WNL volume with decr'ing times as the segments progressed, likely due to fatigue.  05/25/22: Loud /a/ at average mid 90s dB, and everyday sentences averaged upper 80s dB with initial cues for AB. Pt independent with AB and with louder speech in sentences - no cues necessary from SLP.  Short 3-minute conversational segments with SLP proved mostly successful with pt average loudness in upper 60s- lower 70s with average one cue during each segment.  SLP heard dysfluency x2 today - x4 rhythmic, and x3 rhythmic initial phoneme reps.  05/23/22: Pt relates to ST that he feels tired from PT. Conversation for 10 minutes was mid 60s dB and decr'd to low-mid 60s after 5 minutes, with min A from SLP to incr loudness and little success. SLP also noted incr 'd frequency of anomia/rephrasing. 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  Loud /a/ at average low-mid 90s dB, and everyday sentences averaged upper 70s dB with independence. In sentence responses pt's volume incr'd to upper 60s dB average for first 3 responses then began to fade until SLP cue for incr'd loudness and "more effort - be loud." 25 minutes into session pt began drooling. SLP cued pt to close mouth and swallow and pt did so x3 spontaneously: "I'm trying to get all  of the juice out, Glendell Docker," and pt smiled.  Pt saw his meds as he put his drink in his walker bag and told SLP he forgot his meds between 2:30 and 3:00 pm. SLP told [name] this as SLP walked pt out.   05/18/22: Pt arrived today with a mask. De-masked for loud /a/ at average mid 90s dB, and everyday sentences averaged upper 70s dB with independence. In sentence responses [name] averaged WNL volume. However in short conversational segments of 4 minutes x4 pt was able to maintain WNL volume  for no more than 90 seconds, despite SLP providing mod cues occasionally. Noted that pt lost WNL volume more readily as reps progressed, indicating fatigue.  05/16/22: Pt arrives today speaking at mid 60s dB initially for first 4 minutes and then low-mid 60s dB for next 17 minutes in simple conversation with SLP. Loud /a/ average lower 90s dB with rare min A, everyday sentences averaged upper 70s dB with occasional min A. In multi-sentence responses SLP cues for loudness req'd increasing frequency of cues from occasional min to usual min-mod cues due to pt fatigue (ST last today). Pt filled out his CES today with score above.  05/09/22: Pt's loud /a/ average upper 80s dB with rare min A, everyday sentences averaged upper 70s dB with occasional min A. With occasional min-mod A, pt spoke for 15 minutes and measured at low-mid 60s dB; after 10 minutes pt's voice decr'd to low 60s dB. SLP helped pt modify his everyday sentences to make them more  current. SLP strongly encouraged pt to use loud /a/ and everyday sentences BID at home.   05/04/22: SLP explained eval results to pt and wife. SLP reiterated pt must practice loud /a/ and everyday sentences as well as homework with speech hierarchy as directed.    PATIENT EDUCATION: Education details: see "today's treatment" Person educated: Patient and Spouse Education method: Explanation and Demonstration Education comprehension: verbalized understanding  HOME EXERCISE PROGRAM: Loud /a/ and everyday sentences.    GOALS: Goals reviewed with patient? No  SHORT TERM GOALS: Target date: 06/09/22  Produce /a/ with average upper 80s dB over three sessions Baseline:05-16-22, 05-18-22 Goal status: Met  2.  pt will demo abdominal breathing with sentence responses 85% accuracy x 3 sessions  Baseline: 05/25/22, 06/07/22 Goal status: Met  3.  pt will generate 5 minutes simple conversation with low 70s dB average in 3 sessions  Baseline: 06/07/22 Goal status: Ongoing  4.  Pt will use strategies for dysfluency in 1-2 minute short conversational segments  Baseline:  Goal status: Deferred - no difficulty with dysfluency in ST   LONG TERM GOALS: Target date: 07/19/21  pt will produce average upper 60s dB in 10 minutes simple to mod complex conversation in 5 sessions  Baseline:  Goal status: Ongoing  2.  pt will maintain upper 60s dB average in 10 minute simple conversation x 3 sessions  Baseline:  Goal status: Ongoing  3.  pt will use abdominal breathing >80% of the time during 10 minute simple conversation in 3 sessions  Baseline:  Goal status: Ongoing  4.  pt will report fewer requests to repeat himself than prior to ST in 3 sessions  Baseline:  Goal status: Ongoing  5.  Pt will use strategies for dysfluency in 5 minute conversational segments Baseline:  Goal status: Ongoing  6.  Pt will score higher/better on PROM in the last 2 weeks of therapy Baseline:  Goal status:  Ongoing  ASSESSMENT:  CLINICAL  IMPRESSION: Patient is a 72 y.o. male who was seen today for treatment of dysarthria and communcation deficits in light of Parkinsons Disease. Pt tells SLP that family members cont to ask him to repeat frequently, Social situations are still difficult to communicate in due to difficulty being louder when necessary (dart throwing group).    OBJECTIVE IMPAIRMENTS: Objective impairments include executive functioning, aphasia, and dysarthria. These impairments are limiting patient from household responsibilities, ADLs/IADLs, and effectively communicating at home and in community.Factors affecting potential to achieve goals and functional outcome are  none .Marland Kitchen Patient will benefit from skilled SLP services to address above impairments and improve overall function.  REHAB POTENTIAL: Good  PLAN:  SLP FREQUENCY: 2x/week  SLP DURATION: 8 weeks  PLANNED INTERVENTIONS: Environmental controls, Cueing hierachy, Cognitive reorganization, Internal/external aids, Oral motor exercises, Functional tasks, Multimodal communication approach, SLP instruction and feedback, Compensatory strategies, and Patient/family education    [facility], [place] 06/19/2022, 5:30 PM

## 2022-06-21 ENCOUNTER — Ambulatory Visit: Payer: PPO

## 2022-06-21 DIAGNOSIS — R471 Dysarthria and anarthria: Secondary | ICD-10-CM | POA: Diagnosis not present

## 2022-06-21 DIAGNOSIS — R41841 Cognitive communication deficit: Secondary | ICD-10-CM

## 2022-06-21 DIAGNOSIS — R4701 Aphasia: Secondary | ICD-10-CM

## 2022-06-21 NOTE — Therapy (Signed)
OUTPATIENT SPEECH LANGUAGE PATHOLOGY PARKINSON'S TREATMENT/progress note   Patient Name: Matthew [name] MRN: [id number] DOB:Nov 05, 1950, 72 y.o., male Today's Date: 06/21/2022  PCP: Matthew Kettle, MD REFERRING PROVIDER: Alonza Bogus, DO  END OF SESSION:  End of Session - 06/21/22 2311     Visit Number 10    Number of Visits 17    Date for SLP Re-Evaluation 07/19/22    SLP Start Time 1105    SLP Stop Time  1150    SLP Time Calculation (min) 45 min    Activity Tolerance Patient tolerated treatment well                    Past Medical History:  Diagnosis Date   Anxiety    Arthritis    GERD (gastroesophageal reflux disease)    occ remote history   Hypertension    Parkinson disease    Prostate cancer ([place])    Right knee pain    questionable meniscal tear   Seasonal allergies    Past Surgical History:  Procedure Laterality Date   COLONOSCOPY     Leg Laceration Repair  Left    Age 80   PELVIC LYMPH NODE DISSECTION Bilateral 03/22/2018   Procedure: PELVIC LYMPH NODE DISSECTION;  Surgeon: Matthew Mons, MD;  Location: [facility];  Service: Urology;  Laterality: Bilateral;   PROSTATE BIOPSY     ROBOT ASSISTED LAPAROSCOPIC RADICAL PROSTATECTOMY N/A 03/22/2018   Procedure: XI ROBOTIC ASSISTED LAPAROSCOPIC PROSTATECTOMY;  Surgeon: Matthew Mons, MD;  Location: [facility];  Service: Urology;  Laterality: N/A;   TOTAL KNEE ARTHROPLASTY Right 05/05/2019   Procedure: RIGHT TOTAL KNEE ARTHROPLASTY;  Surgeon: Matthew Pear, MD;  Location: [facility];  Service: Orthopedics;  Laterality: Right;   Patient Active Problem List   Diagnosis Date Noted   S/P TKR (total knee replacement), right 05/05/2019   Osteoarthritis of right knee 05/02/2019   Prostate cancer ([place]) 03/22/2018   Malignant neoplasm of prostate ([place]) 02/27/2018   Speech Therapy Progress Note  Dates of Reporting Period: 05/04/22 to present  Subjective Statement: Pt seen for 10 sessions for speech  loudness and linguistic skills.  Objective: See below.  Goal Update: See below.  Plan: Cont to see pt for 4-6 more weeks.  Reason Skilled Services are Required: Pt has made gains with loudness and language but has not maximized improvement.   ONSET DATE: script dated 03-02-22  REFERRING DIAG: Parkinson's Disease  THERAPY DIAG:  No diagnosis found.  Rationale for Evaluation and Treatment: Rehabilitation  SUBJECTIVE:   SUBJECTIVE STATEMENT: "It's early Matthew [name]." Pt accompanied by: self  PERTINENT HISTORY: Pt well-known to this SLP from previous courses of ST.  PAIN:  Are you having pain? No  FALLS: Has patient fallen in last 6 months?  See PT evaluation for details  PATIENT GOALS: Improve loudness and fluency  OBJECTIVE:   DIAGNOSTIC FINDINGS:  MBS 12-22-21 Patient presents with functional oropharyngeal swallowing without aspiration of any consistency tested. He does demonstrate minimal asymptomatic laryngeal penetration of thin liquid due to decreased timing of laryngeal closure. Chin tuck posture with use of straw effective to prevent penetration. Pharyngeal swallow is strong without retention. Oral transiting discoordination noted with pt swallowing barium tablet as he required 2nd bolus of thin to transit through oropharynx. Upon esophageal sweep, pt appeared with barium tablet retained in esophagus with pt awareness. Tablet appeared to transit distally after several boluses of liquids. After tablet had transited, pt continued with "phantom" sensation to esophagus. Recommend  pt continue diet as tolerated, using chin tuck with liquids if improves comfort, decreases cough. Also encouraged pt to assure maintains strength of voice, cough and expectoration for airway protection.   Marland Kitchen  PATIENT REPORTED OUTCOME MEASURES (PROM): Communication Effectiveness Survey: Completed today with score 20/40 (higher scores indicate better or more effective communication. "2" was scored with  conversaing over the phone, speaking to a friend when upset/angry, and having a conversation with someone at a distance.  TODAY'S TREATMENT:                                                                                                                                         DATE:  06/21/22: [name] used louder speech in lobby greeting SLP. Speech volume average mid-upper  60s dB initially upon entering ST room. Loud /a/ averaged mid-90s dB without loudness decay, and everyday sentences averaged mid 80s dB with initial cues for AB. SLP talked with pt in 5-7 minute conversational segments in which pt req'd initial min cues for AB and louder speech. In the last 2 segments pt req'd min-mod cues for loudness and AB, rarely - likely due to fatigue.  SLP reminded pt to swallow first, and not wipe, if he feels himself with anterior labial saliva leakage.  06/19/22: Pt with speech volume average low-mid 60s dB. Loud /a/ averaged mid-90s dB with cues to mitigate loudness decay, and everyday sentences averaged mid 80s dB with initial cues for AB. Pt provided sentence responses with mid 60s dB to upper 60s dB after SLP cued pt for breath support and occasional mod cues for loudness.  SLP encouraged pt to cont to perform HEP BID.   06/06/22: Pt arrived with speech volume average mid-upper 60s dB. Loud /a/ averaged mid-90s dB, and everyday sentences averaged mis-upper 80s dB with initial cues for AB. In simple-mod complex semi-structured sentence responses pt maintained avergae loudness with AB upper 60s dB. In conversational segments pt averaged mid-upper 60s dB with rare min cues for AB.  05/30/22: Pt arrived with suboptimal speech volume. Loud /a/ averaged mid-90s dB, and everyday sentences averaged mis-upper 80s dB with initial cues for AB. In short conversational segments of 6 minutes x4, pt generated WNL volume for the first segment, and then WNL volume with decr'ing times as the segments progressed, likely due to  fatigue.  05/25/22: Loud /a/ at average mid 90s dB, and everyday sentences averaged upper 80s dB with initial cues for AB. Pt independent with AB and with louder speech in sentences - no cues necessary from SLP.  Short 3-minute conversational segments with SLP proved mostly successful with pt average loudness in upper 60s- lower 70s with average one cue during each segment.  SLP heard dysfluency x2 today - x4 rhythmic, and x3 rhythmic initial phoneme reps.  05/23/22: Pt relates to ST that he feels tired from PT. Conversation for 10 minutes was mid 81s  dB and decr'd to low-mid 60s after 5 minutes, with min A from SLP to incr loudness and little success. SLP also noted incr 'd frequency of anomia/rephrasing. 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  Loud /a/ at average low-mid 90s dB, and everyday sentences averaged upper 70s dB with independence. In sentence responses pt's volume incr'd to upper 60s dB average for first 3 responses then began to fade until SLP cue for incr'd loudness and "more effort - be loud." 25 minutes into session pt began drooling. SLP cued pt to close mouth and swallow and pt did so x3 spontaneously: "I'm trying to get all of the juice out, Glendell Docker," and pt smiled.  Pt saw his meds as he put his drink in his walker bag and told SLP he forgot his meds between 2:30 and 3:00 pm. SLP told [name] this as SLP walked pt out.   05/18/22: Pt arrived today with a mask. De-masked for loud /a/ at average mid 90s dB, and everyday sentences averaged upper 70s dB with independence. In sentence responses [name] averaged WNL volume. However in short conversational segments of 4 minutes x4 pt was able to maintain WNL volume  for no more than 90 seconds, despite SLP  providing mod cues occasionally. Noted that pt lost WNL volume more readily as reps progressed, indicating fatigue.  05/16/22: Pt arrives today speaking at mid 60s dB initially for first 4 minutes and then low-mid 60s dB for next 17 minutes in simple conversation with SLP. Loud /a/ average lower 90s dB with rare min A, everyday sentences averaged upper 70s dB with occasional min A. In multi-sentence responses SLP cues for loudness req'd increasing frequency of cues from occasional min to usual min-mod cues due to pt fatigue (ST last today). Pt filled out his CES today with score above.  05/09/22: Pt's loud /a/ average upper 80s dB with rare min A, everyday sentences averaged upper 70s dB with occasional min A. With occasional min-mod A, pt spoke for 15 minutes and measured at low-mid 60s dB; after 10 minutes pt's voice decr'd to low 60s dB. SLP helped pt modify his everyday sentences to make them more current. SLP strongly encouraged pt to use loud /a/ and everyday sentences BID at home.   05/04/22: SLP explained eval results to pt and wife. SLP reiterated pt must practice loud /a/ and everyday sentences as well as homework with speech hierarchy as directed.    PATIENT EDUCATION: Education details: see "today's treatment" Person educated: Patient and Spouse Education method: Explanation and Demonstration Education comprehension: verbalized understanding  HOME EXERCISE PROGRAM: Loud /a/ and everyday sentences.    GOALS: Goals reviewed with patient? No  SHORT TERM GOALS: Target date: 06/09/22  Produce /a/ with average upper 80s dB over three sessions Baseline:05-16-22, 05-18-22 Goal status: Met  2.  pt will demo abdominal breathing with sentence responses 85% accuracy x 3 sessions  Baseline: 05/25/22, 06/07/22 Goal status: Met  3.  pt will generate 5 minutes simple conversation with low 70s dB average in 3 sessions  Baseline: 06/07/22,  Goal status: Partially met 4.  Pt will use  strategies for dysfluency in 1-2 minute short conversational segments  Baseline:  Goal status: Deferred -  no difficulty with dysfluency in ST   LONG TERM GOALS: Target date: 07/19/21  pt will produce average upper 60s dB in 10 minutes simple to mod complex conversation in 5 sessions  Baseline:  Goal status: Ongoing  2.  pt will maintain upper 60s dB average in 10 minute simple conversation x 3 sessions  Baseline:  Goal status: Ongoing  3.  pt will use abdominal breathing >80% of the time during 10 minute simple conversation in 3 sessions  Baseline:  Goal status: Ongoing  4.  pt will report fewer requests to repeat himself than prior to ST in 3 sessions  Baseline:  Goal status: Ongoing  5.  Pt will use strategies for dysfluency in 5 minute conversational segments Baseline:  Goal status: Ongoing  6.  Pt will score higher/better on PROM in the last 2 weeks of therapy Baseline:  Goal status: Ongoing  ASSESSMENT:  CLINICAL IMPRESSION: Patient is a 72 y.o. male who was seen today for treatment of dysarthria and communcation deficits in light of Parkinsons Disease. Pt tells SLP that family members cont to ask him to repeat but less than prior to this ST course. 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    OBJECTIVE IMPAIRMENTS: Objective impairments include executive functioning, aphasia, and dysarthria. These impairments are limiting patient from household responsibilities, ADLs/IADLs, and effectively communicating at home and in community.Factors affecting potential to achieve goals and functional outcome are  none .Marland Kitchen Patient will benefit from skilled SLP services to address above impairments and improve overall function.  REHAB POTENTIAL: Good  PLAN:  SLP FREQUENCY: 2x/week  SLP DURATION: 8 weeks  PLANNED INTERVENTIONS: Environmental controls, Cueing hierachy, Cognitive reorganization,  Internal/external aids, Oral motor exercises, Functional tasks, Multimodal communication approach, SLP instruction and feedback, Compensatory strategies, and Patient/family education    [facility], [place] 06/21/2022, 11:12 PM

## 2022-06-22 ENCOUNTER — Other Ambulatory Visit (HOSPITAL_COMMUNITY): Payer: Self-pay

## 2022-06-23 ENCOUNTER — Telehealth: Payer: Self-pay | Admitting: Pharmacy Technician

## 2022-06-23 ENCOUNTER — Other Ambulatory Visit (HOSPITAL_COMMUNITY): Payer: Self-pay

## 2022-06-23 ENCOUNTER — Other Ambulatory Visit: Payer: Self-pay

## 2022-06-23 NOTE — Telephone Encounter (Signed)
Patient Advocate Encounter  Prior Authorization for [facility] 5000U has been approved.    Request ID# [id number] Effective dates: 7.25.23 through 12.31.24

## 2022-06-26 ENCOUNTER — Ambulatory Visit: Payer: PPO | Attending: Neurology

## 2022-06-26 DIAGNOSIS — R471 Dysarthria and anarthria: Secondary | ICD-10-CM | POA: Diagnosis not present

## 2022-06-26 DIAGNOSIS — R41841 Cognitive communication deficit: Secondary | ICD-10-CM | POA: Insufficient documentation

## 2022-06-26 DIAGNOSIS — R4701 Aphasia: Secondary | ICD-10-CM | POA: Diagnosis not present

## 2022-06-26 NOTE — Therapy (Signed)
OUTPATIENT SPEECH LANGUAGE PATHOLOGY PARKINSON'S TREATMENT  Patient Name: Matthew [name] MRN: [id number] DOB:1950/06/05, 72 y.o., male Today's Date: 06/26/2022  PCP: Lona Kettle, MD REFERRING PROVIDER: Alonza Bogus, DO  END OF SESSION:  End of Session - 06/26/22 1328     Visit Number 11    Number of Visits 17    Date for SLP Re-Evaluation 07/19/22    SLP Start Time 69    SLP Stop Time  1400    SLP Time Calculation (min) 40 min    Activity Tolerance Patient tolerated treatment well                    Past Medical History:  Diagnosis Date   Anxiety    Arthritis    GERD (gastroesophageal reflux disease)    occ remote history   Hypertension    Parkinson disease    Prostate cancer ([place])    Right knee pain    questionable meniscal tear   Seasonal allergies    Past Surgical History:  Procedure Laterality Date   COLONOSCOPY     Leg Laceration Repair  Left    Age 39   PELVIC LYMPH NODE DISSECTION Bilateral 03/22/2018   Procedure: PELVIC LYMPH NODE DISSECTION;  Surgeon: Ceasar Mons, MD;  Location: [facility];  Service: Urology;  Laterality: Bilateral;   PROSTATE BIOPSY     ROBOT ASSISTED LAPAROSCOPIC RADICAL PROSTATECTOMY N/A 03/22/2018   Procedure: XI ROBOTIC ASSISTED LAPAROSCOPIC PROSTATECTOMY;  Surgeon: Ceasar Mons, MD;  Location: [facility];  Service: Urology;  Laterality: N/A;   TOTAL KNEE ARTHROPLASTY Right 05/05/2019   Procedure: RIGHT TOTAL KNEE ARTHROPLASTY;  Surgeon: Frederik Pear, MD;  Location: [facility];  Service: Orthopedics;  Laterality: Right;   Patient Active Problem List   Diagnosis Date Noted   S/P TKR (total knee replacement), right 05/05/2019   Osteoarthritis of right knee 05/02/2019   Prostate cancer ([place]) 03/22/2018   Malignant neoplasm of prostate ([place]) 02/27/2018    ONSET DATE: script dated 03-02-22  REFERRING DIAG: Parkinson's Disease  THERAPY DIAG:  Dysarthria and anarthria  Aphasia  Cognitive communication  deficit  Rationale for Evaluation and Treatment: Rehabilitation  SUBJECTIVE:   SUBJECTIVE STATEMENT: "We went to [place] had to come around the table to hear me. I thought I was speaking clearly enough." Pt accompanied by: self  PERTINENT HISTORY: Pt well-known to this SLP from previous courses of ST.  PAIN:  Are you having pain? No  FALLS: Has patient fallen in last 6 months?  See PT evaluation for details  PATIENT GOALS: Improve loudness and fluency  OBJECTIVE:   DIAGNOSTIC FINDINGS:  MBS 12-22-21 Patient presents with functional oropharyngeal swallowing without aspiration of any consistency tested. He does demonstrate minimal asymptomatic laryngeal penetration of thin liquid due to decreased timing of laryngeal closure. Chin tuck posture with use of straw effective to prevent penetration. Pharyngeal swallow is strong without retention. Oral transiting discoordination noted with pt swallowing barium tablet as he required 2nd bolus of thin to transit through oropharynx. Upon esophageal sweep, pt appeared with barium tablet retained in esophagus with pt awareness. Tablet appeared to transit distally after several boluses of liquids. After tablet had transited, pt continued with "phantom" sensation to esophagus. Recommend pt continue diet as tolerated, using chin tuck with liquids if improves comfort, decreases cough. Also encouraged pt to assure maintains strength of voice, cough and expectoration for airway protection.   Marland Kitchen  PATIENT REPORTED OUTCOME MEASURES (PROM): Communication  Effectiveness Survey: Completed today with score 20/40 (higher scores indicate better or more effective communication. "2" was scored with conversaing over the phone, speaking to a friend when upset/angry, and having a conversation with someone at a distance.  TODAY'S TREATMENT:                                                                                                                                          DATE:  06/26/22: Loud /a/ averaged low to mid-90s dB with minimal loudness decay, and everyday sentences averaged mid 80s dB with initial cues for AB. Picture description today to target detailed verbal expression with picture support. Pt average loudness mid-upper 60s dB with rare min A for loudness. In conversation following this pt average in simple-mod complex conversation was mid 60s dB and incr'd to mid to upper 60s dB with rare min A for loudness. SLP did not hear any dysfluency today, or in the previous session.  06/21/22: Matthew [name] used louder speech in lobby greeting SLP. Speech volume average mid-upper  60s dB initially upon entering ST room. Loud /a/ averaged mid-90s dB without loudness decay, and everyday sentences averaged mid 80s dB with initial cues for AB. SLP talked with pt in 5-7 minute conversational segments in which pt req'd initial min cues for AB and louder speech. In the last 2 segments pt req'd min-mod cues for loudness and AB, rarely - likely due to fatigue.  SLP reminded pt to swallow first, and not wipe, if he feels himself with anterior labial saliva leakage.  06/19/22: Pt with speech volume average low-mid 60s dB. Loud /a/ averaged mid-90s dB with cues to mitigate loudness decay, and everyday sentences averaged mid 80s dB with initial cues for AB. Pt provided sentence responses with mid 60s dB to upper 60s dB after SLP cued pt for breath support and occasional mod cues for loudness.  SLP encouraged pt to cont to perform HEP BID.   06/06/22: Pt arrived with speech volume average mid-upper 60s dB. Loud /a/ averaged mid-90s dB, and everyday sentences averaged mis-upper 80s dB with initial cues for AB. In simple-mod complex semi-structured sentence responses pt maintained avergae loudness with AB upper 60s dB. In conversational segments pt averaged mid-upper 60s dB with rare min cues for AB.  05/30/22: Pt arrived with suboptimal speech volume. Loud /a/ averaged  mid-90s dB, and everyday sentences averaged mis-upper 80s dB with initial cues for AB. In short conversational segments of 6 minutes x4, pt generated WNL volume for the first segment, and then WNL volume with decr'ing times as the segments progressed, likely due to fatigue.  05/25/22: Loud /a/ at average mid 90s dB, and everyday sentences averaged upper 80s dB with initial cues for AB. Pt independent with AB and with louder speech in sentences - no cues necessary from SLP.  Short 3-minute conversational segments with SLP proved mostly successful with pt  average loudness in upper 60s- lower 70s with average one cue during each segment.  SLP heard dysfluency x2 today - x4 rhythmic, and x3 rhythmic initial phoneme reps.  05/23/22: Pt relates to ST that he feels tired from PT. Conversation for 10 minutes was mid 60s dB and decr'd to low-mid 60s after 5 minutes, with min A from SLP to incr loudness and little success. SLP also noted incr 'd frequency of anomia/rephrasing. 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  Loud /a/ at average low-mid 90s dB, and everyday sentences averaged upper 70s dB with independence. In sentence responses pt's volume incr'd to upper 60s dB average for first 3 responses then began to fade until SLP cue for incr'd loudness and "more effort - be loud." 25 minutes into session pt began drooling. SLP cued pt to close mouth and swallow and pt did so x3 spontaneously: "I'm trying to get all of the juice out, Matthew [name]," and pt smiled.  Pt saw his meds as he put his drink in his walker bag and told SLP he forgot his meds between 2:30 and 3:00 pm. SLP told [name] this as SLP walked pt out.   05/18/22: Pt arrived today with a mask. De-masked for loud /a/ at average  mid 90s dB, and everyday sentences averaged upper 70s dB with independence. In sentence responses Matthew [name] averaged WNL volume. However in short conversational segments of 4 minutes x4 pt was able to maintain WNL volume  for no more than 90 seconds, despite SLP providing mod cues occasionally. Noted that pt lost WNL volume more readily as reps progressed, indicating fatigue.  05/16/22: Pt arrives today speaking at mid 60s dB initially for first 4 minutes and then low-mid 60s dB for next 17 minutes in simple conversation with SLP. Loud /a/ average lower 90s dB with rare min A, everyday sentences averaged upper 70s dB with occasional min A. In multi-sentence responses SLP cues for loudness req'd increasing frequency of cues from occasional min to usual min-mod cues due to pt fatigue (ST last today). Pt filled out his CES today with score above.  05/09/22: Pt's loud /a/ average upper 80s dB with rare min A, everyday sentences averaged upper 70s dB with occasional min A. With occasional min-mod A, pt spoke for 15 minutes and measured at low-mid 60s dB; after 10 minutes pt's voice decr'd to low 60s dB. SLP helped pt modify his everyday sentences to make them more current. SLP strongly encouraged pt to use loud /a/ and everyday sentences BID at home.   05/04/22: SLP explained eval results to pt and wife. SLP reiterated pt must practice loud /a/ and everyday sentences as well as homework with speech hierarchy as directed.    PATIENT EDUCATION: Education details: see "today's treatment" Person educated: Patient and Spouse Education method: Explanation and Demonstration Education comprehension: verbalized understanding  HOME EXERCISE PROGRAM: Loud /a/ and everyday sentences.    GOALS: Goals reviewed with patient? No  SHORT TERM GOALS: Target date: 06/09/22  Produce /a/ with average upper 80s dB over three sessions Baseline:05-16-22, 05-18-22 Goal status: Met  2.  pt will demo abdominal breathing with  sentence responses 85% accuracy x 3 sessions  Baseline: 05/25/22, 06/07/22 Goal  status: Met  3.  pt will generate 5 minutes simple conversation with low 70s dB average in 3 sessions  Baseline: 06/07/22,  Goal status: Partially met 4.  Pt will use strategies for dysfluency in 1-2 minute short conversational segments  Baseline:  Goal status: Deferred - no difficulty with dysfluency in ST   LONG TERM GOALS: Target date: 07/19/21  pt will produce average upper 60s dB in 10 minutes simple to mod complex conversation in 5 sessions  Baseline:  Goal status: Ongoing  2.  pt will maintain upper 60s dB average in 10 minute simple conversation x 3 sessions  Baseline:  Goal status: Ongoing  3.  pt will use abdominal breathing >80% of the time during 10 minute simple conversation in 3 sessions  Baseline:  Goal status: Ongoing  4.  pt will report fewer requests to repeat himself than prior to ST in 3 sessions  Baseline:  Goal status: Ongoing  5.  Pt will use strategies for dysfluency in 5 minute conversational segments Baseline:  Goal status: Ongoing  6.  Pt will score higher/better on PROM in the last 2 weeks of therapy Baseline:  Goal status: Ongoing  ASSESSMENT:  CLINICAL IMPRESSION: Patient is a 72 y.o. male who was seen today for treatment of dysarthria and communcation deficits in light of Parkinsons Disease. Pt will have dart throwing group Thursday. 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    OBJECTIVE IMPAIRMENTS: Objective impairments include executive functioning, aphasia, and dysarthria. These impairments are limiting patient from household responsibilities, ADLs/IADLs, and effectively communicating at home and in community.Factors affecting potential to achieve goals and functional outcome are  none .Marland Kitchen Patient will benefit from skilled SLP services to address above impairments and improve overall  function.  REHAB POTENTIAL: Good  PLAN:  SLP FREQUENCY: 2x/week  SLP DURATION: 8 weeks  PLANNED INTERVENTIONS: Environmental controls, Cueing hierachy, Cognitive reorganization, Internal/external aids, Oral motor exercises, Functional tasks, Multimodal communication approach, SLP instruction and feedback, Compensatory strategies, and Patient/family education    [facility], [place] 06/26/2022, 1:28 PM

## 2022-06-28 ENCOUNTER — Ambulatory Visit: Payer: PPO

## 2022-06-28 DIAGNOSIS — R471 Dysarthria and anarthria: Secondary | ICD-10-CM | POA: Diagnosis not present

## 2022-06-28 DIAGNOSIS — R4701 Aphasia: Secondary | ICD-10-CM

## 2022-06-28 DIAGNOSIS — R41841 Cognitive communication deficit: Secondary | ICD-10-CM

## 2022-06-28 NOTE — Therapy (Signed)
OUTPATIENT SPEECH LANGUAGE PATHOLOGY [facility]REAT[facility]  Patient Name: Matthew [name] MRN: [id number] DOB:1950/10/25, 72 y.o., male Today's Date: 06/28/2022  PCP: Lona Kettle, MD REFERRING PROVIDER: Alonza Bogus, DO  END OF SESSION:  End of Session - 06/28/22 1511     Visit Number 12    Number of Visits 17    Date for SLP Re-Evaluation 07/19/22    SLP Start Time 1452    SLP Stop Time  8144    SLP Time Calculation (min) 38 min    Activity Tolerance Patient tolerated treatment well                    Past Medical History:  Diagnosis Date   Anxiety    Arthritis    GERD (gastroesophageal reflux disease)    occ remote history   Hypertension    Parkinson disease    Prostate cancer ([place])    Right knee pain    questionable meniscal tear   Seasonal allergies    Past Surgical History:  Procedure Laterality Date   COLONOSCOPY     Leg Laceration Repair  Left    Age 43   PELVIC LYMPH NODE DISSECTION Bilateral 03/22/2018   Procedure: PELVIC LYMPH NODE DISSECTION;  Surgeon: Ceasar Mons, MD;  Location: [facility];  Service: Urology;  Laterality: Bilateral;   PROSTATE BIOPSY     ROBOT ASSISTED LAPAROSCOPIC RADICAL PROSTATECTOMY N/A 03/22/2018   Procedure: XI ROBOTIC ASSISTED LAPAROSCOPIC PROSTATECTOMY;  Surgeon: Ceasar Mons, MD;  Location: [facility];  Service: Urology;  Laterality: N/A;   TOTAL KNEE ARTHROPLASTY Right 05/05/2019   Procedure: RIGHT TOTAL KNEE ARTHROPLASTY;  Surgeon: Frederik Pear, MD;  Location: [facility];  Service: Orthopedics;  Laterality: Right;   Patient Active Problem List   Diagnosis Date Noted   S/P TKR (total knee replacement), right 05/05/2019   Osteoarthritis of right knee 05/02/2019   Prostate cancer ([place]) 03/22/2018   Malignant neoplasm of prostate ([place]) 02/27/2018    ONSET DATE: script dated 03-02-22  REFERRING DIAG: Parkinson's Disease  THERAPY DIAG:  Dysarthria and anarthria  Aphasia  Cognitive communication  deficit  Rationale for Evaluation and Treatment: Rehabilitation  SUBJECTIVE:   SUBJECTIVE STATEMENT: "We went to [place] had to come around the table to hear me. I thought I was speaking clearly enough." Pt accompanied by: self  PERTINENT HISTORY: Pt well-known to this SLP from previous courses of ST.  PAIN:  Are you having pain? No  FALLS: Has patient fallen in last 6 months?  See PT evaluation for details  PATIENT GOALS: Improve loudness and fluency  OBJECTIVE:   DIAGNOSTIC FINDINGS:  MBS 12-22-21 Patient presents with functional oropharyngeal swallowing without aspiration of any consistency tested. He does demonstrate minimal asymptomatic laryngeal penetration of thin liquid due to decreased timing of laryngeal closure. Chin tuck posture with use of straw effective to prevent penetration. Pharyngeal swallow is strong without retention. Oral transiting discoordination noted with pt swallowing barium tablet as he required 2nd bolus of thin to transit through oropharynx. Upon esophageal sweep, pt appeared with barium tablet retained in esophagus with pt awareness. Tablet appeared to transit distally after several boluses of liquids. After tablet had transited, pt continued with "phantom" sensation to esophagus. Recommend pt continue diet as tolerated, using chin tuck with liquids if improves comfort, decreases cough. Also encouraged pt to assure maintains strength of voice, cough and expectoration for airway protection.   Marland Kitchen  PATIENT REPORTED OUTCOME MEASURES (PROM): Communication  Effectiveness Survey: Completed today with score 20/40 (higher scores indicate better or more effective communication. "2" was scored with conversaing over the phone, speaking to a friend when upset/angry, and having a conversation with someone at a distance.  TODAY'S TREATMENT:                                                                                                                                          DATE:  06/28/22: Loud /a/ averaged low to mid-90s dB with minimal loudness decay - cues for /a/ instead of "uh", and everyday sentences averaged mid 80s dB. Conversation following this pt average in simple-mod complex conversation was mid-upper 60s dB with rare min A for loudness.   06/26/22: Loud /a/ averaged low to mid-90s dB with minimal loudness decay, and everyday sentences averaged mid 80s dB with initial cues for AB. Picture description today to target detailed verbal expression with picture support. Pt average loudness mid-upper 60s dB with rare min A for loudness. In conversation following this pt average in simple-mod complex conversation was mid 60s dB and incr'd to mid to upper 60s dB with rare min A for loudness. SLP did not hear any dysfluency today, or in the previous session.  06/21/22: [name] used louder speech in lobby greeting SLP. Speech volume average mid-upper  60s dB initially upon entering ST room. Loud /a/ averaged mid-90s dB without loudness decay, and everyday sentences averaged mid 80s dB with initial cues for AB. SLP talked with pt in 5-7 minute conversational segments in which pt req'd initial min cues for AB and louder speech. In the last 2 segments pt req'd min-mod cues for loudness and AB, rarely - likely due to fatigue.  SLP reminded pt to swallow first, and not wipe, if he feels himself with anterior labial saliva leakage.  06/19/22: Pt with speech volume average low-mid 60s dB. Loud /a/ averaged mid-90s dB with cues to mitigate loudness decay, and everyday sentences averaged mid 80s dB with initial cues for AB. Pt provided sentence responses with mid 60s dB to upper 60s dB after SLP cued pt for breath support and occasional mod cues for loudness.  SLP encouraged pt to cont to perform HEP BID.   06/06/22: Pt arrived with speech volume average mid-upper 60s dB. Loud /a/ averaged mid-90s dB, and everyday sentences averaged mis-upper 80s dB with initial cues  for AB. In simple-mod complex semi-structured sentence responses pt maintained avergae loudness with AB upper 60s dB. In conversational segments pt averaged mid-upper 60s dB with rare min cues for AB.  05/30/22: Pt arrived with suboptimal speech volume. Loud /a/ averaged mid-90s dB, and everyday sentences averaged mis-upper 80s dB with initial cues for AB. In short conversational segments of 6 minutes x4, pt generated WNL volume for the first segment, and then WNL volume with decr'ing times as the segments progressed, likely due to fatigue.  05/25/22:  Loud /a/ at average mid 90s dB, and everyday sentences averaged upper 80s dB with initial cues for AB. Pt independent with AB and with louder speech in sentences - no cues necessary from SLP.  Short 3-minute conversational segments with SLP proved mostly successful with pt average loudness in upper 60s- lower 70s with average one cue during each segment.  SLP heard dysfluency x2 today - x4 rhythmic, and x3 rhythmic initial phoneme reps.  05/23/22: Pt relates to ST that he feels tired from PT. Conversation for 10 minutes was mid 60s dB and decr'd to low-mid 60s after 5 minutes, with min A from SLP to incr loudness and little success. SLP also noted incr 'd frequency of anomia/rephrasing. 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  Loud /a/ at average low-mid 90s dB, and everyday sentences averaged upper 70s dB with independence. In sentence responses pt's volume incr'd to upper 60s dB average for first 3 responses then began to fade until SLP cue for incr'd loudness and "more effort - be loud." 25 minutes into session pt began drooling. SLP cued pt to close mouth and swallow and pt did so x3 spontaneously: "I'm trying to get all  of the juice out, Glendell Docker," and pt smiled.  Pt saw his meds as he put his drink in his walker bag and told SLP he forgot his meds between 2:30 and 3:00 pm. SLP told [name] this as SLP walked pt out.   05/18/22: Pt arrived today with a mask. De-masked for loud /a/ at average mid 90s dB, and everyday sentences averaged upper 70s dB with independence. In sentence responses [name] averaged WNL volume. However in short conversational segments of 4 minutes x4 pt was able to maintain WNL volume  for no more than 90 seconds, despite SLP providing mod cues occasionally. Noted that pt lost WNL volume more readily as reps progressed, indicating fatigue.   PATIENT EDUCATION: Education details: see "today's treatment" Person educated: Patient and Spouse Education method: Explanation and Demonstration Education comprehension: verbalized understanding  HOME EXERCISE PROGRAM: Loud /a/ and everyday sentences.    GOALS: Goals reviewed with patient? No  SHORT TERM GOALS: Target date: 06/09/22  Produce /a/ with average upper 80s dB over three sessions Baseline:05-16-22, 05-18-22 Goal status: Met  2.  pt will demo abdominal breathing with sentence responses 85% accuracy x 3 sessions  Baseline: 05/25/22, 06/07/22 Goal status: Met  3.  pt will generate 5 minutes simple conversation with low 70s dB average in 3 sessions  Baseline: 06/07/22,  Goal status: Partially met 4.  Pt will use strategies for dysfluency in 1-2 minute short conversational segments  Baseline:  Goal status: Deferred - no difficulty with dysfluency in ST   LONG TERM GOALS: Target date: 07/19/21  pt will produce average upper 60s dB in 10 minutes simple to mod complex conversation in 5 sessions, with occasional min A Baseline:  Goal status: Modfied  2.  pt will maintain upper 60s dB average in 10 minute simple conversation x 3 sessions, with rare min A Baseline:  Goal status: Modified  3.  pt will use abdominal breathing >80% of the time  during 10 minute simple conversation in 3 sessions  Baseline: 06/28/22 Goal  status: Ongoing  4.  pt will report fewer requests to repeat himself than prior to ST in 3 sessions  Baseline:  Goal status: Ongoing  5.  Pt will use strategies for dysfluency in 5 minute conversational segments Baseline:  Goal status: Deferred - SLP has not heard dysfluency in any sessions (06/28/22)  6.  Pt will score higher/better on PROM in the last 2 weeks of therapy Baseline:  Goal status: Ongoing  ASSESSMENT:  CLINICAL IMPRESSION: Patient is a 72 y.o. male who was seen today for treatment of dysarthria and communcation deficits in light of Parkinsons Disease. See today's treatment note for details. Goals were modified/downgraded today to add cueing levels. Pt will have dart throwing group Thursday 06/29/22. 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    OBJECTIVE IMPAIRMENTS: Objective impairments include executive functioning, aphasia, and dysarthria. These impairments are limiting patient from household responsibilities, ADLs/IADLs, and effectively communicating at home and in community.Factors affecting potential to achieve goals and functional outcome are  none .Marland Kitchen Patient will benefit from skilled SLP services to address above impairments and improve overall function.  REHAB POTENTIAL: Good  PLAN:  SLP FREQUENCY: 2x/week  SLP DURATION: 8 weeks  PLANNED INTERVENTIONS: Environmental controls, Cueing hierachy, Cognitive reorganization, Internal/external aids, Oral motor exercises, Functional tasks, Multimodal communication approach, SLP instruction and feedback, Compensatory strategies, and Patient/family education    [facility], [place] 06/28/2022, 3:14 PM

## 2022-06-30 ENCOUNTER — Ambulatory Visit: Payer: PPO | Admitting: Neurology

## 2022-07-03 ENCOUNTER — Ambulatory Visit: Payer: PPO

## 2022-07-03 DIAGNOSIS — R4701 Aphasia: Secondary | ICD-10-CM

## 2022-07-03 DIAGNOSIS — R471 Dysarthria and anarthria: Secondary | ICD-10-CM | POA: Diagnosis not present

## 2022-07-03 DIAGNOSIS — R41841 Cognitive communication deficit: Secondary | ICD-10-CM

## 2022-07-03 NOTE — Therapy (Signed)
OUTPATIENT SPEECH LANGUAGE PATHOLOGY PARKINSON'S TREATMENT  Patient Name: Matthew [name] MRN: [id number] DOB:01/10/1951, 72 y.o., male Today's Date: 07/03/2022  PCP: Lona Kettle, MD REFERRING PROVIDER: Alonza Bogus, DO  END OF SESSION:  End of Session - 07/03/22 1331     Visit Number 13    Number of Visits 17    Date for SLP Re-Evaluation 07/19/22    SLP Start Time 6    SLP Stop Time  1400    SLP Time Calculation (min) 40 min    Activity Tolerance Patient tolerated treatment well                    Past Medical History:  Diagnosis Date   Anxiety    Arthritis    GERD (gastroesophageal reflux disease)    occ remote history   Hypertension    Parkinson disease    Prostate cancer ([place])    Right knee pain    questionable meniscal tear   Seasonal allergies    Past Surgical History:  Procedure Laterality Date   COLONOSCOPY     Leg Laceration Repair  Left    Age 42   PELVIC LYMPH NODE DISSECTION Bilateral 03/22/2018   Procedure: PELVIC LYMPH NODE DISSECTION;  Surgeon: Ceasar Mons, MD;  Location: [facility];  Service: Urology;  Laterality: Bilateral;   PROSTATE BIOPSY     ROBOT ASSISTED LAPAROSCOPIC RADICAL PROSTATECTOMY N/A 03/22/2018   Procedure: XI ROBOTIC ASSISTED LAPAROSCOPIC PROSTATECTOMY;  Surgeon: Ceasar Mons, MD;  Location: [facility];  Service: Urology;  Laterality: N/A;   TOTAL KNEE ARTHROPLASTY Right 05/05/2019   Procedure: RIGHT TOTAL KNEE ARTHROPLASTY;  Surgeon: Frederik Pear, MD;  Location: [facility];  Service: Orthopedics;  Laterality: Right;   Patient Active Problem List   Diagnosis Date Noted   S/P TKR (total knee replacement), right 05/05/2019   Osteoarthritis of right knee 05/02/2019   Prostate cancer ([place]) 03/22/2018   Malignant neoplasm of prostate ([place]) 02/27/2018    ONSET DATE: script dated 03-02-22  REFERRING DIAG: Parkinson's Disease  THERAPY DIAG:  Dysarthria and anarthria  Aphasia  Cognitive communication  deficit  Rationale for Evaluation and Treatment: Rehabilitation  SUBJECTIVE:   SUBJECTIVE STATEMENT: "I went to dart throwing night and it was tough. But the guys couldn't even hear themselves (with the fans/heaters)." Pt accompanied by: self   PAIN:  Are you having pain? No  FALLS: Has patient fallen in last 6 months?  See PT evaluation for details  PATIENT GOALS: Improve loudness and fluency  OBJECTIVE:   DIAGNOSTIC FINDINGS:  MBS 12-22-21 Patient presents with functional oropharyngeal swallowing without aspiration of any consistency tested. He does demonstrate minimal asymptomatic laryngeal penetration of thin liquid due to decreased timing of laryngeal closure. Chin tuck posture with use of straw effective to prevent penetration. Pharyngeal swallow is strong without retention. Oral transiting discoordination noted with pt swallowing barium tablet as he required 2nd bolus of thin to transit through oropharynx. Upon esophageal sweep, pt appeared with barium tablet retained in esophagus with pt awareness. Tablet appeared to transit distally after several boluses of liquids. After tablet had transited, pt continued with "phantom" sensation to esophagus. Recommend pt continue diet as tolerated, using chin tuck with liquids if improves comfort, decreases cough. Also encouraged pt to assure maintains strength of voice, cough and expectoration for airway protection.   Marland Kitchen  PATIENT REPORTED OUTCOME MEASURES (PROM): Communication Effectiveness Survey: Completed today with score 20/40 (higher scores indicate better or more effective communication. "2"  was scored with conversaing over the phone, speaking to a friend when upset/angry, and having a conversation with someone at a distance.  TODAY'S TREATMENT:                                                                                                                                         DATE:  07/03/22: Conversation prior to loud /a/ was mid 60s  dB average. Loud /a/ averaged low to mid-90s dB with minimal loudness decay, and everyday sentences averaged mid 80s dB. Conversation today mid-upper 60s dB in conversational segments of 5-7 minutes x5, then mid 60s for the last 2 segments with occasional min-mod cues for loudness.   06/28/22: Loud /a/ averaged low to mid-90s dB with minimal loudness decay - cues for /a/ instead of "uh", and everyday sentences averaged mid 80s dB. Conversation following this pt average in simple-mod complex conversation was mid-upper 60s dB with rare min A for loudness.   06/26/22: Loud /a/ averaged low to mid-90s dB with minimal loudness decay, and everyday sentences averaged mid 80s dB with initial cues for AB. Picture description today to target detailed verbal expression with picture support. Pt average loudness mid-upper 60s dB with rare min A for loudness. In conversation following this pt average in simple-mod complex conversation was mid 60s dB and incr'd to mid to upper 60s dB with rare min A for loudness. SLP did not hear any dysfluency today, or in the previous session.  06/21/22: [name] used louder speech in lobby greeting SLP. Speech volume average mid-upper  60s dB initially upon entering ST room. Loud /a/ averaged mid-90s dB without loudness decay, and everyday sentences averaged mid 80s dB with initial cues for AB. SLP talked with pt in 5-7 minute conversational segments in which pt req'd initial min cues for AB and louder speech. In the last 2 segments pt req'd min-mod cues for loudness and AB, rarely - likely due to fatigue.  SLP reminded pt to swallow first, and not wipe, if he feels himself with anterior labial saliva leakage.  06/19/22: Pt with speech volume average low-mid 60s dB. Loud /a/ averaged mid-90s dB with cues to mitigate loudness decay, and everyday sentences averaged mid 80s dB with initial cues for AB. Pt provided sentence responses with mid 60s dB to upper 60s dB after SLP cued pt for breath  support and occasional mod cues for loudness.  SLP encouraged pt to cont to perform HEP BID.   06/06/22: Pt arrived with speech volume average mid-upper 60s dB. Loud /a/ averaged mid-90s dB, and everyday sentences averaged mis-upper 80s dB with initial cues for AB. In simple-mod complex semi-structured sentence responses pt maintained avergae loudness with AB upper 60s dB. In conversational segments pt averaged mid-upper 60s dB with rare min cues for AB.  05/30/22: Pt arrived with suboptimal speech volume. Loud /a/ averaged mid-90s dB, and everyday sentences averaged  mis-upper 80s dB with initial cues for AB. In short conversational segments of 6 minutes x4, pt generated WNL volume for the first segment, and then WNL volume with decr'ing times as the segments progressed, likely due to fatigue.  05/25/22: Loud /a/ at average mid 90s dB, and everyday sentences averaged upper 80s dB with initial cues for AB. Pt independent with AB and with louder speech in sentences - no cues necessary from SLP.  Short 3-minute conversational segments with SLP proved mostly successful with pt average loudness in upper 60s- lower 70s with average one cue during each segment.  SLP heard dysfluency x2 today - x4 rhythmic, and x3 rhythmic initial phoneme reps.  05/23/22: Pt relates to ST that he feels tired from PT. Conversation for 10 minutes was mid 60s dB and decr'd to low-mid 60s after 5 minutes, with min A from SLP to incr loudness and little success. SLP also noted incr 'd frequency of anomia/rephrasing. 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  Loud /a/ at average low-mid 90s dB, and everyday sentences averaged upper 70s dB with independence. In sentence responses pt's volume incr'd to  upper 60s dB average for first 3 responses then began to fade until SLP cue for incr'd loudness and "more effort - be loud." 25 minutes into session pt began drooling. SLP cued pt to close mouth and swallow and pt did so x3 spontaneously: "I'm trying to get all of the juice out, Glendell Docker," and pt smiled.  Pt saw his meds as he put his drink in his walker bag and told SLP he forgot his meds between 2:30 and 3:00 pm. SLP told [name] this as SLP walked pt out.   05/18/22: Pt arrived today with a mask. De-masked for loud /a/ at average mid 90s dB, and everyday sentences averaged upper 70s dB with independence. In sentence responses [name] averaged WNL volume. However in short conversational segments of 4 minutes x4 pt was able to maintain WNL volume  for no more than 90 seconds, despite SLP providing mod cues occasionally. Noted that pt lost WNL volume more readily as reps progressed, indicating fatigue.   PATIENT EDUCATION: Education details: see "today's treatment" Person educated: Patient and Spouse Education method: Explanation and Demonstration Education comprehension: verbalized understanding  HOME EXERCISE PROGRAM: Loud /a/ and everyday sentences.    GOALS: Goals reviewed with patient? No  SHORT TERM GOALS: Target date: 06/09/22  Produce /a/ with average upper 80s dB over three sessions Baseline:05-16-22, 05-18-22 Goal status: Met  2.  pt will demo abdominal breathing with sentence responses 85% accuracy x 3 sessions  Baseline: 05/25/22, 06/07/22 Goal status: Met  3.  pt will generate 5 minutes simple conversation with low 70s dB average in 3 sessions  Baseline: 06/07/22,  Goal status: Partially met 4.  Pt will use strategies for dysfluency in 1-2 minute short conversational segments  Baseline:  Goal status: Deferred - no difficulty with dysfluency in ST   LONG TERM GOALS: Target date: 07/19/21  pt will produce average upper 60s dB in 10 minutes simple to mod complex conversation in 5  sessions, with occasional min A Baseline:  Goal status: Modfied  2.  pt will maintain upper 60s dB average  in 10 minute simple conversation x 3 sessions, with rare min A Baseline:  Goal status: Modified  3.  pt will use abdominal breathing >80% of the time during 10 minute simple conversation in 3 sessions  Baseline: 06/28/22 Goal status: Ongoing  4.  pt will report fewer requests to repeat himself than prior to ST in 3 sessions  Baseline:  Goal status: Ongoing  5.  Pt will use strategies for dysfluency in 5 minute conversational segments Baseline:  Goal status: Deferred - SLP has not heard dysfluency in any sessions (06/28/22)  6.  Pt will score higher/better on PROM in the last 2 weeks of therapy Baseline:  Goal status: Ongoing  ASSESSMENT:  CLINICAL IMPRESSION: Patient is a 72 y.o. male who was seen today for treatment of dysarthria and communcation deficits in light of Parkinsons Disease. See today's treatment note for details. Social situations remain difficult to communicate in due to difficulty being louder when necessary (dart throwing group).    OBJECTIVE IMPAIRMENTS: Objective impairments include executive functioning, aphasia, and dysarthria. These impairments are limiting patient from household responsibilities, ADLs/IADLs, and effectively communicating at home and in community.Factors affecting potential to achieve goals and functional outcome are  none .Marland Kitchen Patient will benefit from skilled SLP services to address above impairments and improve overall function.  REHAB POTENTIAL: Good  PLAN:  SLP FREQUENCY: 2x/week  SLP DURATION: 8 weeks  PLANNED INTERVENTIONS: Environmental controls, Cueing hierachy, Cognitive reorganization, Internal/external aids, Oral motor exercises, Functional tasks, Multimodal communication approach, SLP instruction and feedback, Compensatory strategies, and Patient/family education    [facility], [place] 07/03/2022, 1:34 PM

## 2022-07-05 ENCOUNTER — Ambulatory Visit: Payer: PPO

## 2022-07-07 ENCOUNTER — Ambulatory Visit: Payer: PPO

## 2022-07-07 DIAGNOSIS — R471 Dysarthria and anarthria: Secondary | ICD-10-CM | POA: Diagnosis not present

## 2022-07-07 DIAGNOSIS — R41841 Cognitive communication deficit: Secondary | ICD-10-CM

## 2022-07-07 DIAGNOSIS — R4701 Aphasia: Secondary | ICD-10-CM

## 2022-07-07 NOTE — Therapy (Signed)
OUTPATIENT SPEECH LANGUAGE PATHOLOGY PARKINSON'S TREATMENT  Patient Name: Matthew [name] MRN: [id number] DOB:1950-10-28, 72 y.o., male Today's Date: 07/07/2022  PCP: Matthew Kettle, MD REFERRING PROVIDER: Alonza Bogus, DO  END OF SESSION:  End of Session - 07/07/22 2322     Visit Number 14    Number of Visits 17    Date for SLP Re-Evaluation 07/19/22    SLP Start Time 1105    SLP Stop Time  1146    SLP Time Calculation (min) 41 min    Activity Tolerance Patient tolerated treatment well                    Past Medical History:  Diagnosis Date   Anxiety    Arthritis    GERD (gastroesophageal reflux disease)    occ remote history   Hypertension    Parkinson disease    Prostate cancer ([place])    Right knee pain    questionable meniscal tear   Seasonal allergies    Past Surgical History:  Procedure Laterality Date   COLONOSCOPY     Leg Laceration Repair  Left    Age 24   PELVIC LYMPH NODE DISSECTION Bilateral 03/22/2018   Procedure: PELVIC LYMPH NODE DISSECTION;  Surgeon: Matthew Mons, MD;  [facility];  Service: Urology;  Laterality: Bilateral;   PROSTATE BIOPSY     ROBOT ASSISTED LAPAROSCOPIC RADICAL PROSTATECTOMY N/A 03/22/2018   Procedure: XI ROBOTIC ASSISTED LAPAROSCOPIC PROSTATECTOMY;  Surgeon: Matthew Mons, MD;  [facility]: [facility];  Service: Urology;  Laterality: N/A;   TOTAL KNEE ARTHROPLASTY Right 05/05/2019   Procedure: RIGHT TOTAL KNEE ARTHROPLASTY;  Surgeon: Matthew Pear, MD;  Location: [facility];  Service: Orthopedics;  Laterality: Right;   Patient Active Problem List   Diagnosis Date Noted   S/P TKR (total knee replacement), right 05/05/2019   Osteoarthritis of right knee 05/02/2019   Prostate cancer ([place]) 03/22/2018   Malignant neoplasm of prostate ([place]) 02/27/2018    ONSET DATE: script dated 03-02-22  REFERRING DIAG: Parkinson's Disease  THERAPY DIAG:  Dysarthria and anarthria  Cognitive communication  deficit  Aphasia  Rationale for Evaluation and Treatment: Rehabilitation  SUBJECTIVE:   SUBJECTIVE STATEMENT: "Doing pretty well today Matthew [name]." Pt accompanied by: self   PAIN:  Are you having pain? No  FALLS: Has patient fallen in last 6 months?  See PT evaluation for details  PATIENT GOALS: Improve loudness and fluency  OBJECTIVE:   DIAGNOSTIC FINDINGS:  MBS 12-22-21 Patient presents with functional oropharyngeal swallowing without aspiration of any consistency tested. He does demonstrate minimal asymptomatic laryngeal penetration of thin liquid due to decreased timing of laryngeal closure. Chin tuck posture with use of straw effective to prevent penetration. Pharyngeal swallow is strong without retention. Oral transiting discoordination noted with pt swallowing barium tablet as he required 2nd bolus of thin to transit through oropharynx. Upon esophageal sweep, pt appeared with barium tablet retained in esophagus with pt awareness. Tablet appeared to transit distally after several boluses of liquids. After tablet had transited, pt continued with "phantom" sensation to esophagus. Recommend pt continue diet as tolerated, using chin tuck with liquids if improves comfort, decreases cough. Also encouraged pt to assure maintains strength of voice, cough and expectoration for airway protection.   Marland Kitchen  PATIENT REPORTED OUTCOME MEASURES (PROM): Communication Effectiveness Survey: Completed today with score 20/40 (higher scores indicate better or more effective communication. "2" was scored with conversaing over the phone, speaking to a friend when upset/angry, and having  a conversation with someone at a distance.  TODAY'S TREATMENT:                                                                                                                                         DATE:  07/07/22: Loud /a/ averaged low 90s dB until SLP said something about being a little softer then pt incr'd loudness with last  3/5 to low-mid 90s dB.  No loudness decay heard today with loud /a/, and everyday sentences averaged mid 80s dB. Conversation today mid-upper 60s dB in conversational segments of 7--10 minutes x3  07/03/22: Conversation prior to loud /a/ was mid 60s dB average. Loud /a/ averaged low to mid-90s dB with minimal loudness decay, and everyday sentences averaged mid 80s dB. Conversation today mid-upper 60s dB in conversational segments of 5-7 minutes x5, then mid 60s for the last 2 segments with occasional min-mod cues for loudness.   06/28/22: Loud /a/ averaged low to mid-90s dB with minimal loudness decay - cues for /a/ instead of "uh", and everyday sentences averaged mid 80s dB. Conversation following this pt average in simple-mod complex conversation was mid-upper 60s dB with rare min A for loudness.   06/26/22: Loud /a/ averaged low to mid-90s dB with minimal loudness decay, and everyday sentences averaged mid 80s dB with initial cues for AB. Picture description today to target detailed verbal expression with picture support. Pt average loudness mid-upper 60s dB with rare min A for loudness. In conversation following this pt average in simple-mod complex conversation was mid 60s dB and incr'd to mid to upper 60s dB with rare min A for loudness. SLP did not hear any dysfluency today, or in the previous session.  06/21/22: [name] used louder speech in lobby greeting SLP. Speech volume average mid-upper  60s dB initially upon entering ST room. Loud /a/ averaged mid-90s dB without loudness decay, and everyday sentences averaged mid 80s dB with initial cues for AB. SLP talked with pt in 5-7 minute conversational segments in which pt req'd initial min cues for AB and louder speech. In the last 2 segments pt req'd min-mod cues for loudness and AB, rarely - likely due to fatigue.  SLP reminded pt to swallow first, and not wipe, if he feels himself with anterior labial saliva leakage.  06/19/22: Pt with speech volume  average low-mid 60s dB. Loud /a/ averaged mid-90s dB with cues to mitigate loudness decay, and everyday sentences averaged mid 80s dB with initial cues for AB. Pt provided sentence responses with mid 60s dB to upper 60s dB after SLP cued pt for breath support and occasional mod cues for loudness.  SLP encouraged pt to cont to perform HEP BID.   06/06/22: Pt arrived with speech volume average mid-upper 60s dB. Loud /a/ averaged mid-90s dB, and everyday sentences averaged mis-upper 80s dB with initial cues for AB. In simple-mod complex semi-structured sentence  responses pt maintained avergae loudness with AB upper 60s dB. In conversational segments pt averaged mid-upper 60s dB with rare min cues for AB.  05/30/22: Pt arrived with suboptimal speech volume. Loud /a/ averaged mid-90s dB, and everyday sentences averaged mis-upper 80s dB with initial cues for AB. In short conversational segments of 6 minutes x4, pt generated WNL volume for the first segment, and then WNL volume with decr'ing times as the segments progressed, likely due to fatigue.  05/25/22: Loud /a/ at average mid 90s dB, and everyday sentences averaged upper 80s dB with initial cues for AB. Pt independent with AB and with louder speech in sentences - no cues necessary from SLP.  Short 3-minute conversational segments with SLP proved mostly successful with pt average loudness in upper 60s- lower 70s with average one cue during each segment.  SLP heard dysfluency x2 today - x4 rhythmic, and x3 rhythmic initial phoneme reps.  05/23/22: Pt relates to ST that he feels tired from PT. Conversation for 10 minutes was mid 60s dB and decr'd to low-mid 60s after 5 minutes, with min A from SLP to incr loudness and little success. SLP also noted incr 'd frequency of anomia/rephrasing. 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  Loud /a/ at average low-mid 90s dB, and everyday sentences averaged upper 70s dB with independence. In sentence responses pt's volume incr'd to upper 60s dB average for first 3 responses then began to fade until SLP cue for incr'd loudness and "more effort - be loud." 25 minutes into session pt began drooling. SLP cued pt to close mouth and swallow and pt did so x3 spontaneously: "I'm trying to get all of the juice out, Glendell Docker," and pt smiled.  Pt saw his meds as he put his drink in his walker bag and told SLP he forgot his meds between 2:30 and 3:00 pm. SLP told [name] this as SLP walked pt out.   05/18/22: Pt arrived today with a mask. De-masked for loud /a/ at average mid 90s dB, and everyday sentences averaged upper 70s dB with independence. In sentence responses [name] averaged WNL volume. However in short conversational segments of 4 minutes x4 pt was able to maintain WNL volume  for no more than 90 seconds, despite SLP providing mod cues occasionally. Noted that pt lost WNL volume more readily as reps progressed, indicating fatigue.   PATIENT EDUCATION: Education details: see "today's treatment" Person educated: Patient and Spouse Education method: Explanation and Demonstration Education comprehension: verbalized understanding  HOME EXERCISE PROGRAM: Loud /a/ and everyday sentences.    GOALS: Goals reviewed with patient? No  SHORT TERM GOALS: Target date: 06/09/22  Produce /a/ with average upper 80s dB over three sessions Baseline:05-16-22, 05-18-22 Goal status: Met  2.  pt will demo abdominal breathing with sentence responses 85% accuracy x 3 sessions  Baseline: 05/25/22, 06/07/22 Goal status: Met  3.  pt will generate 5 minutes simple conversation with low 70s dB average in 3 sessions  Baseline: 06/07/22,  Goal status: Partially met 4.  Pt will use strategies for dysfluency in 1-2  minute short conversational segments  Baseline:  Goal status: Deferred - no difficulty with dysfluency in ST   LONG TERM GOALS:  Target date: 07/19/21  pt will produce average upper 60s dB in 10 minutes simple to mod complex conversation in 5 sessions, with occasional min A Baseline: 07/07/22 Goal status: Modfied  2.  pt will maintain upper 60s dB average in 10 minute simple conversation x 3 sessions, with rare min A Baseline:  Goal status: Modified  3.  pt will use abdominal breathing >80% of the time during 10 minute simple conversation in 3 sessions  Baseline: 06/28/22, 07/07/22 Goal status: Ongoing  4.  pt will report fewer requests to repeat himself than prior to ST in 3 sessions  Baseline:  Goal status: Ongoing  5.  Pt will use strategies for dysfluency in 5 minute conversational segments Baseline:  Goal status: Deferred - SLP has not heard dysfluency in any sessions (06/28/22)  6.  Pt will score higher/better on PROM in the last 2 weeks of therapy Baseline:  Goal status: Ongoing  ASSESSMENT:  CLINICAL IMPRESSION: Patient is a 73 y.o. male who was seen today for treatment of dysarthria and communcation deficits in light of Parkinsons Disease. See today's treatment note for details. Social situations remain difficult to communicate in due to difficulty being louder when necessary (dart throwing group).    OBJECTIVE IMPAIRMENTS: Objective impairments include executive functioning, aphasia, and dysarthria. These impairments are limiting patient from household responsibilities, ADLs/IADLs, and effectively communicating at home and in community.Factors affecting potential to achieve goals and functional outcome are  none .Marland Kitchen Patient will benefit from skilled SLP services to address above impairments and improve overall function.  REHAB POTENTIAL: Good  PLAN:  SLP FREQUENCY: 2x/week  SLP DURATION: 8 weeks  PLANNED INTERVENTIONS: Environmental controls, Cueing hierachy, Cognitive  reorganization, Internal/external aids, Oral motor exercises, Functional tasks, Multimodal communication approach, SLP instruction and feedback, Compensatory strategies, and Patient/family education    [facility], [place] 07/07/2022, 11:23 PM

## 2022-07-10 ENCOUNTER — Ambulatory Visit: Payer: PPO

## 2022-07-10 DIAGNOSIS — R4701 Aphasia: Secondary | ICD-10-CM

## 2022-07-10 DIAGNOSIS — R471 Dysarthria and anarthria: Secondary | ICD-10-CM | POA: Diagnosis not present

## 2022-07-10 DIAGNOSIS — R41841 Cognitive communication deficit: Secondary | ICD-10-CM

## 2022-07-10 NOTE — Therapy (Signed)
OUTPATIENT SPEECH LANGUAGE PATHOLOGY PARKINSON'S TREATMENT  Patient Name: Matthew [name] MRN: [id number] DOB:August 23, 1950, 72 y.o., male Today's Date: 07/10/2022  PCP: Matthew Kettle, MD REFERRING PROVIDER: Alonza Bogus, DO  END OF SESSION:  End of Session - 07/10/22 1404     Visit Number 15    Number of Visits 17    Date for SLP Re-Evaluation 07/19/22    SLP Start Time 57    SLP Stop Time  1400    SLP Time Calculation (min) 40 min    Activity Tolerance Patient tolerated treatment well                     Past Medical History:  Diagnosis Date   Anxiety    Arthritis    GERD (gastroesophageal reflux disease)    occ remote history   Hypertension    Parkinson disease    Prostate cancer ([place])    Right knee pain    questionable meniscal tear   Seasonal allergies    Past Surgical History:  Procedure Laterality Date   COLONOSCOPY     Leg Laceration Repair  Left    Age 31   PELVIC LYMPH NODE DISSECTION Bilateral 03/22/2018   Procedure: PELVIC LYMPH NODE DISSECTION;  Surgeon: Matthew Mons, MD;  Location: [facility];  Service: Urology;  Laterality: Bilateral;   PROSTATE BIOPSY     ROBOT ASSISTED LAPAROSCOPIC RADICAL PROSTATECTOMY N/A 03/22/2018   Procedure: XI ROBOTIC ASSISTED LAPAROSCOPIC PROSTATECTOMY;  Surgeon: Matthew Mons, MD;  Location: [facility];  Service: Urology;  Laterality: N/A;   TOTAL KNEE ARTHROPLASTY Right 05/05/2019   Procedure: RIGHT TOTAL KNEE ARTHROPLASTY;  Surgeon: Matthew Pear, MD;  Location: [facility];  Service: Orthopedics;  Laterality: Right;   Patient Active Problem List   Diagnosis Date Noted   S/P TKR (total knee replacement), right 05/05/2019   Osteoarthritis of right knee 05/02/2019   Prostate cancer ([place]) 03/22/2018   Malignant neoplasm of prostate ([place]) 02/27/2018    ONSET DATE: script dated 03-02-22  REFERRING DIAG: Parkinson's Disease  THERAPY DIAG:  Dysarthria and anarthria  Cognitive communication  deficit  Aphasia  Rationale for Evaluation and Treatment: Rehabilitation  SUBJECTIVE:   SUBJECTIVE STATEMENT: "Doing pretty well today Matthew [name]." Pt accompanied by: self   PAIN:  Are you having pain? No  FALLS: Has patient fallen in last 6 months?  See PT evaluation for details  PATIENT GOALS: Improve loudness and fluency  OBJECTIVE:   DIAGNOSTIC FINDINGS:  MBS 12-22-21 Patient presents with functional oropharyngeal swallowing without aspiration of any consistency tested. He does demonstrate minimal asymptomatic laryngeal penetration of thin liquid due to decreased timing of laryngeal closure. Chin tuck posture with use of straw effective to prevent penetration. Pharyngeal swallow is strong without retention. Oral transiting discoordination noted with pt swallowing barium tablet as he required 2nd bolus of thin to transit through oropharynx. Upon esophageal sweep, pt appeared with barium tablet retained in esophagus with pt awareness. Tablet appeared to transit distally after several boluses of liquids. After tablet had transited, pt continued with "phantom" sensation to esophagus. Recommend pt continue diet as tolerated, using chin tuck with liquids if improves comfort, decreases cough. Also encouraged pt to assure maintains strength of voice, cough and expectoration for airway protection.   Marland Kitchen  PATIENT REPORTED OUTCOME MEASURES (PROM): Communication Effectiveness Survey: Completed today with score 20/40 (higher scores indicate better or more effective communication. "2" was scored with conversaing over the phone, speaking to a friend when upset/angry, and  having a conversation with someone at a distance.  TODAY'S TREATMENT:                                                                                                                                         DATE:  07/10/22: oLud /a/ averaged low-mid 90s dB. No loudness decay heard today with loud /a/. Everyday sentences averaged mid 80s  dB. Conversational segments of 7 minutes x4 with fading loudness each segment with SLP rare min A ultimately to occasional min-mod A.  07/07/22: Loud /a/ averaged low 90s dB until SLP said something about being a little softer then pt incr'd loudness with last 3/5 to low-mid 90s dB.  No loudness decay heard today with loud /a/, and everyday sentences averaged mid 80s dB. Conversation today mid-upper 60s dB in conversational segments of 7--10 minutes x3  07/03/22: Conversation prior to loud /a/ was mid 60s dB average. Loud /a/ averaged low to mid-90s dB with minimal loudness decay, and everyday sentences averaged mid 80s dB. Conversation today mid-upper 60s dB in conversational segments of 5-7 minutes x5, then mid 60s for the last 2 segments with occasional min-mod cues for loudness.   06/28/22: Loud /a/ averaged low to mid-90s dB with minimal loudness decay - cues for /a/ instead of "uh", and everyday sentences averaged mid 80s dB. Conversation following this pt average in simple-mod complex conversation was mid-upper 60s dB with rare min A for loudness.   06/26/22: Loud /a/ averaged low to mid-90s dB with minimal loudness decay, and everyday sentences averaged mid 80s dB with initial cues for AB. Picture description today to target detailed verbal expression with picture support. Pt average loudness mid-upper 60s dB with rare min A for loudness. In conversation following this pt average in simple-mod complex conversation was mid 60s dB and incr'd to mid to upper 60s dB with rare min A for loudness. SLP did not hear any dysfluency today, or in the previous session.  06/21/22: [name] used louder speech in lobby greeting SLP. Speech volume average mid-upper  60s dB initially upon entering ST room. Loud /a/ averaged mid-90s dB without loudness decay, and everyday sentences averaged mid 80s dB with initial cues for AB. SLP talked with pt in 5-7 minute conversational segments in which pt req'd initial min cues for AB  and louder speech. In the last 2 segments pt req'd min-mod cues for loudness and AB, rarely - likely due to fatigue.  SLP reminded pt to swallow first, and not wipe, if he feels himself with anterior labial saliva leakage.  06/19/22: Pt with speech volume average low-mid 60s dB. Loud /a/ averaged mid-90s dB with cues to mitigate loudness decay, and everyday sentences averaged mid 80s dB with initial cues for AB. Pt provided sentence responses with mid 60s dB to upper 60s dB after SLP cued pt for breath support and occasional mod cues for loudness.  SLP encouraged pt to cont to perform HEP BID.   06/06/22: Pt arrived with speech volume average mid-upper 60s dB. Loud /a/ averaged mid-90s dB, and everyday sentences averaged mis-upper 80s dB with initial cues for AB. In simple-mod complex semi-structured sentence responses pt maintained avergae loudness with AB upper 60s dB. In conversational segments pt averaged mid-upper 60s dB with rare min cues for AB.  05/30/22: Pt arrived with suboptimal speech volume. Loud /a/ averaged mid-90s dB, and everyday sentences averaged mis-upper 80s dB with initial cues for AB. In short conversational segments of 6 minutes x4, pt generated WNL volume for the first segment, and then WNL volume with decr'ing times as the segments progressed, likely due to fatigue.  05/25/22: Loud /a/ at average mid 90s dB, and everyday sentences averaged upper 80s dB with initial cues for AB. Pt independent with AB and with louder speech in sentences - no cues necessary from SLP.  Short 3-minute conversational segments with SLP proved mostly successful with pt average loudness in upper 60s- lower 70s with average one cue during each segment.  SLP heard dysfluency x2 today - x4 rhythmic, and x3 rhythmic initial phoneme reps.  05/23/22: Pt relates to ST that he feels tired from PT. Conversation for 10 minutes was mid 60s dB and decr'd to low-mid 60s after 5 minutes, with min A from SLP to incr loudness  and little success. SLP also noted incr 'd frequency of anomia/rephrasing. 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  Loud /a/ at average low-mid 90s dB, and everyday sentences averaged upper 70s dB with independence. In sentence responses pt's volume incr'd to upper 60s dB average for first 3 responses then began to fade until SLP cue for incr'd loudness and "more effort - be loud." 25 minutes into session pt began drooling. SLP cued pt to close mouth and swallow and pt did so x3 spontaneously: "I'm trying to get all of the juice out, Glendell Docker," and pt smiled.  Pt saw his meds as he put his drink in his walker bag and told SLP he forgot his meds between 2:30 and 3:00 pm. SLP told [name] this as SLP walked pt out.   05/18/22: Pt arrived today with a mask. De-masked for loud /a/ at average mid 90s dB, and everyday sentences averaged upper 70s dB with independence. In sentence responses [name] averaged WNL volume. However in short conversational segments of 4 minutes x4 pt was able to maintain WNL volume  for no more than 90 seconds, despite SLP providing mod cues occasionally. Noted that pt lost WNL volume more readily as reps progressed, indicating fatigue.   PATIENT EDUCATION: Education details: see "today's treatment" Person educated: Patient and Spouse Education method: Explanation and Demonstration Education comprehension: verbalized understanding  HOME EXERCISE PROGRAM: Loud /a/ and everyday sentences.    GOALS: Goals reviewed with patient? No  SHORT TERM GOALS: Target date: 06/09/22  Produce /a/ with average upper 80s dB over three sessions Baseline:05-16-22, 05-18-22 Goal status: Met  2.  pt will demo abdominal breathing with sentence responses 85%  accuracy x 3 sessions  Baseline: 05/25/22, 06/07/22 Goal status: Met  3.  pt will generate 5 minutes simple conversation with low 70s dB average  in 3 sessions  Baseline: 06/07/22,  Goal status: Partially met 4.  Pt will use strategies for dysfluency in 1-2 minute short conversational segments  Baseline:  Goal status: Deferred - no difficulty with dysfluency in ST   LONG TERM GOALS: Target date: 07/19/21  pt will produce average upper 60s dB in 10 minutes simple to mod complex conversation in 5 sessions, with occasional min A Baseline: 07/07/22 Goal status: Modfied  2.  pt will maintain upper 60s dB average in 10 minute simple conversation x 3 sessions, with rare min A Baseline:  Goal status: Modified  3.  pt will use abdominal breathing >80% of the time during 10 minute simple conversation in 3 sessions  Baseline: 06/28/22, 07/07/22 Goal status: Ongoing  4.  pt will report fewer requests to repeat himself than prior to ST in 3 sessions  Baseline:  Goal status: Ongoing  5.  Pt will use strategies for dysfluency in 5 minute conversational segments Baseline:  Goal status: Deferred - SLP has not heard dysfluency in any sessions (06/28/22)  6.  Pt will score higher/better on PROM in the last 2 weeks of therapy Baseline:  Goal status: Ongoing  ASSESSMENT:  CLINICAL IMPRESSION: Patient is a 72 y.o. male who was seen today for treatment of dysarthria and communcation deficits in light of Parkinsons Disease. See today's treatment note for details. Social situations remain difficult to communicate in due to difficulty being louder when necessary (dart throwing group).    OBJECTIVE IMPAIRMENTS: Objective impairments include executive functioning, aphasia, and dysarthria. These impairments are limiting patient from household responsibilities, ADLs/IADLs, and effectively communicating at home and in community.Factors affecting potential to achieve goals and functional outcome are  none .Marland Kitchen Patient  will benefit from skilled SLP services to address above impairments and improve overall function.  REHAB POTENTIAL: Good  PLAN:  SLP FREQUENCY: 2x/week  SLP DURATION: 8 weeks  PLANNED INTERVENTIONS: Environmental controls, Cueing hierachy, Cognitive reorganization, Internal/external aids, Oral motor exercises, Functional tasks, Multimodal communication approach, SLP instruction and feedback, Compensatory strategies, and Patient/family education    [facility], [place] 07/10/2022, 2:05 PM

## 2022-07-12 ENCOUNTER — Ambulatory Visit: Payer: PPO

## 2022-07-12 DIAGNOSIS — R471 Dysarthria and anarthria: Secondary | ICD-10-CM

## 2022-07-12 DIAGNOSIS — R41841 Cognitive communication deficit: Secondary | ICD-10-CM

## 2022-07-12 DIAGNOSIS — R4701 Aphasia: Secondary | ICD-10-CM

## 2022-07-12 NOTE — Therapy (Signed)
OUTPATIENT SPEECH LANGUAGE PATHOLOGY PARKINSON'S TREATMENT/Renewal-recert  Patient Name: Matthew [name] MRN: [id number] DOB:11/09/1950, 72 y.o., male Today's Date: 07/12/2022  PCP: Lona Kettle, MD REFERRING PROVIDER: Alonza Bogus, DO  END OF SESSION:  End of Session - 07/12/22 1445     Visit Number 16    Number of Visits 22    Date for SLP Re-Evaluation 08/11/22    SLP Start Time 38    SLP Stop Time  1400    SLP Time Calculation (min) 40 min    Activity Tolerance Patient tolerated treatment well                      Past Medical History:  Diagnosis Date   Anxiety    Arthritis    GERD (gastroesophageal reflux disease)    occ remote history   Hypertension    Parkinson disease    Prostate cancer ([place])    Right knee pain    questionable meniscal tear   Seasonal allergies    Past Surgical History:  Procedure Laterality Date   COLONOSCOPY     Leg Laceration Repair  Left    Age 9   PELVIC LYMPH NODE DISSECTION Bilateral 03/22/2018   Procedure: PELVIC LYMPH NODE DISSECTION;  Surgeon: Ceasar Mons, MD;  [facility]: [facility];  Service: Urology;  Laterality: Bilateral;   PROSTATE BIOPSY     ROBOT ASSISTED LAPAROSCOPIC RADICAL PROSTATECTOMY N/A 03/22/2018   Procedure: XI ROBOTIC ASSISTED LAPAROSCOPIC PROSTATECTOMY;  Surgeon: Ceasar Mons, MD;  Location: [facility];  Service: Urology;  Laterality: N/A;   TOTAL KNEE ARTHROPLASTY Right 05/05/2019   Procedure: RIGHT TOTAL KNEE ARTHROPLASTY;  Surgeon: Frederik Pear, MD;  Location: [facility];  Service: Orthopedics;  Laterality: Right;   Patient Active Problem List   Diagnosis Date Noted   S/P TKR (total knee replacement), right 05/05/2019   Osteoarthritis of right knee 05/02/2019   Prostate cancer ([place]) 03/22/2018   Malignant neoplasm of prostate ([place]) 02/27/2018    ONSET DATE: script dated 03-02-22  REFERRING DIAG: Parkinson's Disease  THERAPY DIAG:  Dysarthria and  anarthria  Aphasia  Cognitive communication deficit  Rationale for Evaluation and Treatment: Rehabilitation  SUBJECTIVE:   SUBJECTIVE STATEMENT: "I feel like I have more moments of a clear voice now, Matthew [name]. Sometimes I just can't get loud enough." Pt accompanied by: self   PAIN:  Are you having pain? No  FALLS: Has patient fallen in last 6 months?  See PT evaluation for details  PATIENT GOALS: Improve loudness and fluency  OBJECTIVE:   DIAGNOSTIC FINDINGS:  MBS 12-22-21 Patient presents with functional oropharyngeal swallowing without aspiration of any consistency tested. He does demonstrate minimal asymptomatic laryngeal penetration of thin liquid due to decreased timing of laryngeal closure. Chin tuck posture with use of straw effective to prevent penetration. Pharyngeal swallow is strong without retention. Oral transiting discoordination noted with pt swallowing barium tablet as he required 2nd bolus of thin to transit through oropharynx. Upon esophageal sweep, pt appeared with barium tablet retained in esophagus with pt awareness. Tablet appeared to transit distally after several boluses of liquids. After tablet had transited, pt continued with "phantom" sensation to esophagus. Recommend pt continue diet as tolerated, using chin tuck with liquids if improves comfort, decreases cough. Also encouraged pt to assure maintains strength of voice, cough and expectoration for airway protection.   Marland Kitchen  PATIENT REPORTED OUTCOME MEASURES (PROM): Communication Effectiveness Survey: Completed today with score 20/40 (higher scores indicate better or more effective  communication. "2" was scored with conversaing over the phone, speaking to a friend when upset/angry, and having a conversation with someone at a distance.  TODAY'S TREATMENT:                                                                                                                                         DATE:  07/12/22: Loud /a/  averaged low-mid 90s dB. Everyday sentences averaged mid 80s dB. Conversation for 5-7 minute segments x4 today resulted in SLP mod cues for speech volume in upper 60s dB, occasionally.   07/10/22: oLud /a/ averaged low-mid 90s dB. No loudness decay heard today with loud /a/. Everyday sentences averaged mid 80s dB. Conversational segments of 7 minutes x4 with fading loudness each segment with SLP rare min A ultimately to occasional min-mod A.  07/07/22: Loud /a/ averaged low 90s dB until SLP said something about being a little softer then pt incr'd loudness with last 3/5 to low-mid 90s dB.  No loudness decay heard today with loud /a/, and everyday sentences averaged mid 80s dB. Conversation today mid-upper 60s dB in conversational segments of 7--10 minutes x3  07/03/22: Conversation prior to loud /a/ was mid 60s dB average. Loud /a/ averaged low to mid-90s dB with minimal loudness decay, and everyday sentences averaged mid 80s dB. Conversation today mid-upper 60s dB in conversational segments of 5-7 minutes x5, then mid 60s for the last 2 segments with occasional min-mod cues for loudness.   06/28/22: Loud /a/ averaged low to mid-90s dB with minimal loudness decay - cues for /a/ instead of "uh", and everyday sentences averaged mid 80s dB. Conversation following this pt average in simple-mod complex conversation was mid-upper 60s dB with rare min A for loudness.   06/26/22: Loud /a/ averaged low to mid-90s dB with minimal loudness decay, and everyday sentences averaged mid 80s dB with initial cues for AB. Picture description today to target detailed verbal expression with picture support. Pt average loudness mid-upper 60s dB with rare min A for loudness. In conversation following this pt average in simple-mod complex conversation was mid 60s dB and incr'd to mid to upper 60s dB with rare min A for loudness. SLP did not hear any dysfluency today, or in the previous session.  06/21/22: [name] used louder speech in  lobby greeting SLP. Speech volume average mid-upper  60s dB initially upon entering ST room. Loud /a/ averaged mid-90s dB without loudness decay, and everyday sentences averaged mid 80s dB with initial cues for AB. SLP talked with pt in 5-7 minute conversational segments in which pt req'd initial min cues for AB and louder speech. In the last 2 segments pt req'd min-mod cues for loudness and AB, rarely - likely due to fatigue.  SLP reminded pt to swallow first, and not wipe, if he feels himself with anterior labial saliva leakage.  06/19/22: Pt with speech volume average  low-mid 60s dB. Loud /a/ averaged mid-90s dB with cues to mitigate loudness decay, and everyday sentences averaged mid 80s dB with initial cues for AB. Pt provided sentence responses with mid 60s dB to upper 60s dB after SLP cued pt for breath support and occasional mod cues for loudness.  SLP encouraged pt to cont to perform HEP BID.   06/06/22: Pt arrived with speech volume average mid-upper 60s dB. Loud /a/ averaged mid-90s dB, and everyday sentences averaged mis-upper 80s dB with initial cues for AB. In simple-mod complex semi-structured sentence responses pt maintained avergae loudness with AB upper 60s dB. In conversational segments pt averaged mid-upper 60s dB with rare min cues for AB.  05/30/22: Pt arrived with suboptimal speech volume. Loud /a/ averaged mid-90s dB, and everyday sentences averaged mis-upper 80s dB with initial cues for AB. In short conversational segments of 6 minutes x4, pt generated WNL volume for the first segment, and then WNL volume with decr'ing times as the segments progressed, likely due to fatigue.  05/25/22: Loud /a/ at average mid 90s dB, and everyday sentences averaged upper 80s dB with initial cues for AB. Pt independent with AB and with louder speech in sentences - no cues necessary from SLP.  Short 3-minute conversational segments with SLP proved mostly successful with pt average loudness in upper 60s-  lower 70s with average one cue during each segment.  SLP heard dysfluency x2 today - x4 rhythmic, and x3 rhythmic initial phoneme reps.   PATIENT EDUCATION: Education details: see "today's treatment" Person educated: Patient and Spouse Education method: Explanation and Demonstration Education comprehension: verbalized understanding  HOME EXERCISE PROGRAM: Loud /a/ and everyday sentences.    GOALS: Goals reviewed with patient? No  SHORT TERM GOALS: Target date: 06/09/22  Produce /a/ with average upper 80s dB over three sessions Baseline:05-16-22, 05-18-22 Goal status: Met  2.  pt will demo abdominal breathing with sentence responses 85% accuracy x 3 sessions  Baseline: 05/25/22, 06/07/22 Goal status: Met  3.  pt will generate 5 minutes simple conversation with low 70s dB average in 3 sessions  Baseline: 06/07/22,  Goal status: Partially met 4.  Pt will use strategies for dysfluency in 1-2 minute short conversational segments  Baseline:  Goal status: Deferred - no difficulty with dysfluency in ST   LONG TERM GOALS: Target date: 07/19/21, 08/11/22  pt will produce average upper 60s dB in 10 minutes simple to mod complex conversation in 5 sessions, with occasional min A Baseline: 07/07/22 Goal status: Modfied  2.  pt will maintain upper 60s dB average in 10 minute simple conversation x 3 sessions, with rare min A Baseline:  Goal status: Modified  3.  pt will use abdominal breathing >80% of the time during 10 minute simple conversation in 3 sessions  Baseline: 06/28/22, 07/07/22 Goal status: Ongoing  4.  pt will report fewer requests to repeat himself than prior to ST in 3 sessions  Baseline:  Goal status: Ongoing  5.  Pt will use strategies for dysfluency in 5 minute conversational segments Baseline:  Goal status: Deferred - SLP has not heard dysfluency in any sessions (06/28/22)  6.  Pt will score higher/better on PROM in the last 2 weeks of therapy Baseline:  Goal status:  Ongoing  ASSESSMENT:  CLINICAL IMPRESSION: Recertification today. Patient is a 72 y.o. male who was seen today for treatment of dysarthria and communcation deficits in light of Parkinsons Disease. See today's treatment note for details. Social situations remain difficult to  communicate in due to difficulty being louder when necessary (dart throwing group). Pt is making slow progress and is likely to be d/c'd in 2-3 weeks.   OBJECTIVE IMPAIRMENTS: Objective impairments include executive functioning, aphasia, and dysarthria. These impairments are limiting patient from household responsibilities, ADLs/IADLs, and effectively communicating at home and in community.Factors affecting potential to achieve goals and functional outcome are  none .Marland Kitchen Patient will benefit from skilled SLP services to address above impairments and improve overall function.  REHAB POTENTIAL: Good  PLAN:  SLP FREQUENCY: 2x/week  SLP DURATION: 8 weeks  PLANNED INTERVENTIONS: Environmental controls, Cueing hierachy, Cognitive reorganization, Internal/external aids, Oral motor exercises, Functional tasks, Multimodal communication approach, SLP instruction and feedback, Compensatory strategies, and Patient/family education    [facility], [place] 07/12/2022, 2:49 PM

## 2022-07-14 ENCOUNTER — Ambulatory Visit: Payer: PPO | Admitting: Neurology

## 2022-07-14 VITALS — BP 148/80 | HR 74 | Wt 157.8 lb

## 2022-07-14 DIAGNOSIS — K117 Disturbances of salivary secretion: Secondary | ICD-10-CM

## 2022-07-14 MED ORDER — RIMABOTULINUMTOXINB 5000 UNIT/ML IM SOLN
5000.0000 [IU] | Freq: Once | INTRAMUSCULAR | Status: AC
  Administered 2022-07-14: 5000 [IU] via INTRAMUSCULAR

## 2022-07-14 NOTE — Procedures (Signed)
[facility]    History:  Diagnosis: Sialorrhea    Result History  Helps but wears off early  Consent obtained from: The patient The patient was educated on the botulinum toxin the black blox warning and given a copy of the botox patient medication guide.  The patient understands that this warning states that there have been reported cases of the Botox extending beyond the injection site and creating adverse effects, similar to those of botulism. This included loss of strength, trouble walking, hoarseness, trouble saying words clearly, loss of bladder control, trouble breathing, trouble swallowing, diplopia, blurry vision and ptosis. Most of the distant spread of Botox was happening in patients, primarily children, who received medication for spasticity or for cervical dystonia. The patient expressed understanding and desire to proceed.     Injections  Location Left  Right Units Number of sites  Submandibular gland 250 250 500 1 per side  Parotid 2250 2250 2500 1 per side  TOTAL UNITS:     5000      Type of Toxin: Myobloc type B As ordered and injected IM at today's visit Total Units: 5000  Discarded Units: 0  Needle drawback with each injection was free of blood. Pt tolerated procedure well without complications.   Reinjection is anticipated in 3 months.

## 2022-07-17 ENCOUNTER — Ambulatory Visit: Payer: PPO

## 2022-07-17 DIAGNOSIS — R471 Dysarthria and anarthria: Secondary | ICD-10-CM

## 2022-07-17 DIAGNOSIS — R41841 Cognitive communication deficit: Secondary | ICD-10-CM

## 2022-07-17 DIAGNOSIS — R4701 Aphasia: Secondary | ICD-10-CM

## 2022-07-17 NOTE — Therapy (Signed)
OUTPATIENT SPEECH LANGUAGE PATHOLOGY PARKINSON'S TREATMENT  Patient Name: Matthew [name] MRN: [id number] DOB:1950/06/01, 72 y.o., male Today's Date: 07/17/2022  PCP: Lona Kettle, MD REFERRING PROVIDER: Alonza Bogus, DO  END OF SESSION:  End of Session - 07/17/22 1453     Visit Number 17    Number of Visits 22    Date for SLP Re-Evaluation 08/11/22    SLP Start Time 1319    SLP Stop Time  1400    SLP Time Calculation (min) 41 min    Activity Tolerance Patient tolerated treatment well                       Past Medical History:  Diagnosis Date   Anxiety    Arthritis    GERD (gastroesophageal reflux disease)    occ remote history   Hypertension    Parkinson disease    Prostate cancer ([place])    Right knee pain    questionable meniscal tear   Seasonal allergies    Past Surgical History:  Procedure Laterality Date   COLONOSCOPY     Leg Laceration Repair  Left    Age 63   PELVIC LYMPH NODE DISSECTION Bilateral 03/22/2018   Procedure: PELVIC LYMPH NODE DISSECTION;  Surgeon: Ceasar Mons, MD;  Location: [facility];  Service: Urology;  Laterality: Bilateral;   PROSTATE BIOPSY     ROBOT ASSISTED LAPAROSCOPIC RADICAL PROSTATECTOMY N/A 03/22/2018   Procedure: XI ROBOTIC ASSISTED LAPAROSCOPIC PROSTATECTOMY;  Surgeon: Ceasar Mons, MD;  Location: [facility];  Service: Urology;  Laterality: N/A;   TOTAL KNEE ARTHROPLASTY Right 05/05/2019   Procedure: RIGHT TOTAL KNEE ARTHROPLASTY;  Surgeon: Frederik Pear, MD;  Location: [facility];  Service: Orthopedics;  Laterality: Right;   Patient Active Problem List   Diagnosis Date Noted   S/P TKR (total knee replacement), right 05/05/2019   Osteoarthritis of right knee 05/02/2019   Prostate cancer ([place]) 03/22/2018   Malignant neoplasm of prostate ([place]) 02/27/2018    ONSET DATE: script dated 03-02-22  REFERRING DIAG: Parkinson's Disease  THERAPY DIAG:  Dysarthria and anarthria  Cognitive communication  deficit  Aphasia  Rationale for Evaluation and Treatment: Rehabilitation  SUBJECTIVE:   SUBJECTIVE STATEMENT: "Today it feels like I just can't get loud." Pt accompanied by: self   PAIN:  Are you having pain? No  FALLS: Has patient fallen in last 6 months?  See PT evaluation for details  PATIENT GOALS: Improve loudness and fluency  OBJECTIVE:   DIAGNOSTIC FINDINGS:  MBS 12-22-21 Patient presents with functional oropharyngeal swallowing without aspiration of any consistency tested. He does demonstrate minimal asymptomatic laryngeal penetration of thin liquid due to decreased timing of laryngeal closure. Chin tuck posture with use of straw effective to prevent penetration. Pharyngeal swallow is strong without retention. Oral transiting discoordination noted with pt swallowing barium tablet as he required 2nd bolus of thin to transit through oropharynx. Upon esophageal sweep, pt appeared with barium tablet retained in esophagus with pt awareness. Tablet appeared to transit distally after several boluses of liquids. After tablet had transited, pt continued with "phantom" sensation to esophagus. Recommend pt continue diet as tolerated, using chin tuck with liquids if improves comfort, decreases cough. Also encouraged pt to assure maintains strength of voice, cough and expectoration for airway protection.   Marland Kitchen  PATIENT REPORTED OUTCOME MEASURES (PROM): Communication Effectiveness Survey: Completed today with score 20/40 (higher scores indicate better or more effective communication. "2" was scored with conversaing over the phone, speaking  to a friend when upset/angry, and having a conversation with someone at a distance.  TODAY'S TREATMENT:                                                                                                                                         DATE:  07/17/22: Pt had Botox injection Friday 07/14/22. Loud /a/ averaged low 90s dB. Everyday sentences averaged mid 80s  dB. Conversation following loud /a/ was in upper 60s - low 70s dB for 7 minutes and then decr'd to mid 60s. SLP reiterated pt must practice loud /a/ and sentences at LEAST 6/7 days/week and explained rationale.   07/12/22: Loud /a/ averaged low-mid 90s dB. Everyday sentences averaged mid 80s dB. Conversation for 5-7 minute segments x4 today resulted in SLP mod cues for speech volume in upper 60s dB, occasionally.   07/10/22: oLud /a/ averaged low-mid 90s dB. No loudness decay heard today with loud /a/. Everyday sentences averaged mid 80s dB. Conversational segments of 7 minutes x4 with fading loudness each segment with SLP rare min A ultimately to occasional min-mod A.  07/07/22: Loud /a/ averaged low 90s dB until SLP said something about being a little softer then pt incr'd loudness with last 3/5 to low-mid 90s dB.  No loudness decay heard today with loud /a/, and everyday sentences averaged mid 80s dB. Conversation today mid-upper 60s dB in conversational segments of 7--10 minutes x3  07/03/22: Conversation prior to loud /a/ was mid 60s dB average. Loud /a/ averaged low to mid-90s dB with minimal loudness decay, and everyday sentences averaged mid 80s dB. Conversation today mid-upper 60s dB in conversational segments of 5-7 minutes x5, then mid 60s for the last 2 segments with occasional min-mod cues for loudness.   06/28/22: Loud /a/ averaged low to mid-90s dB with minimal loudness decay - cues for /a/ instead of "uh", and everyday sentences averaged mid 80s dB. Conversation following this pt average in simple-mod complex conversation was mid-upper 60s dB with rare min A for loudness.   06/26/22: Loud /a/ averaged low to mid-90s dB with minimal loudness decay, and everyday sentences averaged mid 80s dB with initial cues for AB. Picture description today to target detailed verbal expression with picture support. Pt average loudness mid-upper 60s dB with rare min A for loudness. In conversation following this  pt average in simple-mod complex conversation was mid 60s dB and incr'd to mid to upper 60s dB with rare min A for loudness. SLP did not hear any dysfluency today, or in the previous session.  06/21/22: [name] used louder speech in lobby greeting SLP. Speech volume average mid-upper  60s dB initially upon entering ST room. Loud /a/ averaged mid-90s dB without loudness decay, and everyday sentences averaged mid 80s dB with initial cues for AB. SLP talked with pt in 5-7 minute conversational segments in which pt req'd initial min cues for AB  and louder speech. In the last 2 segments pt req'd min-mod cues for loudness and AB, rarely - likely due to fatigue.  SLP reminded pt to swallow first, and not wipe, if he feels himself with anterior labial saliva leakage.  06/19/22: Pt with speech volume average low-mid 60s dB. Loud /a/ averaged mid-90s dB with cues to mitigate loudness decay, and everyday sentences averaged mid 80s dB with initial cues for AB. Pt provided sentence responses with mid 60s dB to upper 60s dB after SLP cued pt for breath support and occasional mod cues for loudness.  SLP encouraged pt to cont to perform HEP BID.   06/06/22: Pt arrived with speech volume average mid-upper 60s dB. Loud /a/ averaged mid-90s dB, and everyday sentences averaged mis-upper 80s dB with initial cues for AB. In simple-mod complex semi-structured sentence responses pt maintained avergae loudness with AB upper 60s dB. In conversational segments pt averaged mid-upper 60s dB with rare min cues for AB.  05/30/22: Pt arrived with suboptimal speech volume. Loud /a/ averaged mid-90s dB, and everyday sentences averaged mis-upper 80s dB with initial cues for AB. In short conversational segments of 6 minutes x4, pt generated WNL volume for the first segment, and then WNL volume with decr'ing times as the segments progressed, likely due to fatigue.  05/25/22: Loud /a/ at average mid 90s dB, and everyday sentences averaged upper 80s dB  with initial cues for AB. Pt independent with AB and with louder speech in sentences - no cues necessary from SLP.  Short 3-minute conversational segments with SLP proved mostly successful with pt average loudness in upper 60s- lower 70s with average one cue during each segment.  SLP heard dysfluency x2 today - x4 rhythmic, and x3 rhythmic initial phoneme reps.   PATIENT EDUCATION: Education details: see "today's treatment" Person educated: Patient and Spouse Education method: Explanation and Demonstration Education comprehension: verbalized understanding  HOME EXERCISE PROGRAM: Loud /a/ and everyday sentences.    GOALS: Goals reviewed with patient? No  SHORT TERM GOALS: Target date: 06/09/22  Produce /a/ with average upper 80s dB over three sessions Baseline:05-16-22, 05-18-22 Goal status: Met  2.  pt will demo abdominal breathing with sentence responses 85% accuracy x 3 sessions  Baseline: 05/25/22, 06/07/22 Goal status: Met  3.  pt will generate 5 minutes simple conversation with low 70s dB average in 3 sessions  Baseline: 06/07/22,  Goal status: Partially met 4.  Pt will use strategies for dysfluency in 1-2 minute short conversational segments  Baseline:  Goal status: Deferred - no difficulty with dysfluency in ST   LONG TERM GOALS: Target date: 07/19/21, 08/11/22  pt will produce average upper 60s dB in 10 minutes simple to mod complex conversation in 5 sessions, with occasional min A Baseline: 07/07/22 Goal status: Modfied  2.  pt will maintain upper 60s dB average in 10 minute simple conversation x 3 sessions, with rare min A Baseline:  Goal status: Modified  3.  pt will use abdominal breathing >80% of the time during 10 minute simple conversation in 3 sessions  Baseline: 06/28/22, 07/07/22 Goal status: Ongoing  4.  pt will report fewer requests to repeat himself than prior to ST in 3 sessions  Baseline:  Goal status: Ongoing  5.  Pt will use strategies for dysfluency  in 5 minute conversational segments Baseline:  Goal status: Deferred - SLP has not heard dysfluency in any sessions (06/28/22)  6.  Pt will score higher/better on PROM in the last 2  weeks of therapy Baseline:  Goal status: Ongoing  ASSESSMENT:  CLINICAL IMPRESSION: Patient is a 72 y.o. male who was seen today for treatment of dysarthria and communcation deficits in light of Parkinsons Disease. See today's treatment note for details. Social situations remain difficult to communicate in due to difficulty being louder when necessary (dart throwing group). Pt is making slow progress and is likely to be d/c'd in 2-3 weeks.   OBJECTIVE IMPAIRMENTS: Objective impairments include executive functioning, aphasia, and dysarthria. These impairments are limiting patient from household responsibilities, ADLs/IADLs, and effectively communicating at home and in community.Factors affecting potential to achieve goals and functional outcome are  none .Marland Kitchen Patient will benefit from skilled SLP services to address above impairments and improve overall function.  REHAB POTENTIAL: Good  PLAN:  SLP FREQUENCY: 2x/week  SLP DURATION: 8 weeks  PLANNED INTERVENTIONS: Environmental controls, Cueing hierachy, Cognitive reorganization, Internal/external aids, Oral motor exercises, Functional tasks, Multimodal communication approach, SLP instruction and feedback, Compensatory strategies, and Patient/family education    [facility] [place] 07/17/2022, 2:54 PM

## 2022-07-19 ENCOUNTER — Ambulatory Visit: Payer: PPO

## 2022-07-19 DIAGNOSIS — R471 Dysarthria and anarthria: Secondary | ICD-10-CM

## 2022-07-19 DIAGNOSIS — R41841 Cognitive communication deficit: Secondary | ICD-10-CM

## 2022-07-19 DIAGNOSIS — R4701 Aphasia: Secondary | ICD-10-CM

## 2022-07-19 NOTE — Therapy (Signed)
OUTPATIENT SPEECH LANGUAGE PATHOLOGY PARKINSON'S TREATMENT/Discharge summary  Patient Name: Matthew [name] MRN: [id number] DOB:November 18, 1950, 72 y.o., male Today's Date: 07/19/2022  PCP: Matthew Kettle, MD REFERRING PROVIDER: Alonza Bogus, DO  END OF SESSION:  End of Session - 07/19/22 1340     Visit Number 18    Number of Visits 22    Date for SLP Re-Evaluation 08/11/22    SLP Start Time 1321    SLP Stop Time  1400    SLP Time Calculation (min) 39 min    Activity Tolerance Patient tolerated treatment well                       Past Medical History:  Diagnosis Date   Anxiety    Arthritis    GERD (gastroesophageal reflux disease)    occ remote history   Hypertension    Parkinson disease    Prostate cancer ([place])    Right knee pain    questionable meniscal tear   Seasonal allergies    Past Surgical History:  Procedure Laterality Date   COLONOSCOPY     Leg Laceration Repair  Left    Age 57   PELVIC LYMPH NODE DISSECTION Bilateral 03/22/2018   Procedure: PELVIC LYMPH NODE DISSECTION;  Surgeon: Matthew Mons, MD;  Location: [facility];  Service: Urology;  Laterality: Bilateral;   PROSTATE BIOPSY     ROBOT ASSISTED LAPAROSCOPIC RADICAL PROSTATECTOMY N/A 03/22/2018   Procedure: XI ROBOTIC ASSISTED LAPAROSCOPIC PROSTATECTOMY;  Surgeon: Matthew Mons, MD;  Location: [facility];  Service: Urology;  Laterality: N/A;   TOTAL KNEE ARTHROPLASTY Right 05/05/2019   Procedure: RIGHT TOTAL KNEE ARTHROPLASTY;  Surgeon: Matthew Pear, MD;  Location: [facility];  Service: Orthopedics;  Laterality: Right;   Patient Active Problem List   Diagnosis Date Noted   S/P TKR (total knee replacement), right 05/05/2019   Osteoarthritis of right knee 05/02/2019   Prostate cancer ([place]) 03/22/2018   Malignant neoplasm of prostate ([place]) 02/27/2018    SPEECH THERAPY DISCHARGE SUMMARY  Visits from Start of Care: 18  Current functional level related to goals / functional  outcomes: See below.   Remaining deficits: Mild dysarthria, worse with fatigue and incr'd cognitive load on verbal message.   Education / Equipment: None   Patient agrees to discharge. Patient goals were partially met. Patient is being discharged due to being pleased with the current functional level..     ONSET DATE: script dated 03-02-22  REFERRING DIAG: Parkinson's Disease  THERAPY DIAG:  Dysarthria and anarthria  Cognitive communication deficit  Aphasia  Rationale for Evaluation and Treatment: Rehabilitation  SUBJECTIVE:   SUBJECTIVE STATEMENT: "Today it feels like I just can't get loud." Pt accompanied by: self   PAIN:  Are you having pain? No  FALLS: Has patient fallen in last 6 months?  See PT evaluation for details  PATIENT GOALS: Improve loudness and fluency  OBJECTIVE:   DIAGNOSTIC FINDINGS:  MBS 12-22-21 Patient presents with functional oropharyngeal swallowing without aspiration of any consistency tested. He does demonstrate minimal asymptomatic laryngeal penetration of thin liquid due to decreased timing of laryngeal closure. Chin tuck posture with use of straw effective to prevent penetration. Pharyngeal swallow is strong without retention. Oral transiting discoordination noted with pt swallowing barium tablet as he required 2nd bolus of thin to transit through oropharynx. Upon esophageal sweep, pt appeared with barium tablet retained in esophagus with pt awareness. Tablet appeared to transit distally after several boluses of liquids. After tablet  had transited, pt continued with "phantom" sensation to esophagus. Recommend pt continue diet as tolerated, using chin tuck with liquids if improves comfort, decreases cough. Also encouraged pt to assure maintains strength of voice, cough and expectoration for airway protection.   Marland Kitchen  PATIENT REPORTED OUTCOME MEASURES (PROM): Communication Effectiveness Survey: Completed today with score 20/40 (higher scores  indicate better or more effective communication. "2" was scored with conversaing over the phone, speaking to a friend when upset/angry, and having a conversation with someone at a distance.  TODAY'S TREATMENT:                                                                                                                                         DATE:  07/17/22: Matthew [name] stated Matthew [name] is asking him less to repeat himself. "Not  much but I think it is less." Matthew [name] also shares it is easier for him to communicate at poker and dart nights. Pt had Botox injection Friday 07/14/22. Loud /a/ averaged low 90s dB. Everyday sentences averaged mid 80s dB. Conversation following loud /a/ was in upper 60s - low 70s dB for 7 minutes, and 6 minutes and then decr'd to mid 60s dB in two conversation segments. SLP reiterated pt must practice loud /a/ and sentences at LEAST 6/7 days/week and explained rationale.  Matthew [name] responded with answers for CES 22/40 an increase from his initial response, specifically talking to someone on the phone was better as well as speaking to familiar person at home.  07/12/22: Loud /a/ averaged low-mid 90s dB. Everyday sentences averaged mid 80s dB. Conversation for 5-7 minute segments x4 today resulted in SLP mod cues for speech volume in upper 60s dB, occasionally.   07/10/22: oLud /a/ averaged low-mid 90s dB. No loudness decay heard today with loud /a/. Everyday sentences averaged mid 80s dB. Conversational segments of 7 minutes x4 with fading loudness each segment with SLP rare min A ultimately to occasional min-mod A.  07/07/22: Loud /a/ averaged low 90s dB until SLP said something about being a little softer then pt incr'd loudness with last 3/5 to low-mid 90s dB.  No loudness decay heard today with loud /a/, and everyday sentences averaged mid 80s dB. Conversation today mid-upper 60s dB in conversational segments of 7--10 minutes x3  07/03/22: Conversation prior to loud /a/ was mid 60s dB average.  Loud /a/ averaged low to mid-90s dB with minimal loudness decay, and everyday sentences averaged mid 80s dB. Conversation today mid-upper 60s dB in conversational segments of 5-7 minutes x5, then mid 60s for the last 2 segments with occasional min-mod cues for loudness.   06/28/22: Loud /a/ averaged low to mid-90s dB with minimal loudness decay - cues for /a/ instead of "uh", and everyday sentences averaged mid 80s dB. Conversation following this pt average in simple-mod complex conversation was mid-upper 60s dB with rare min A for loudness.  06/26/22: Loud /a/ averaged low to mid-90s dB with minimal loudness decay, and everyday sentences averaged mid 80s dB with initial cues for AB. Picture description today to target detailed verbal expression with picture support. Pt average loudness mid-upper 60s dB with rare min A for loudness. In conversation following this pt average in simple-mod complex conversation was mid 60s dB and incr'd to mid to upper 60s dB with rare min A for loudness. SLP did not hear any dysfluency today, or in the previous session.  06/21/22: Matthew [name] used louder speech in lobby greeting SLP. Speech volume average mid-upper  60s dB initially upon entering ST room. Loud /a/ averaged mid-90s dB without loudness decay, and everyday sentences averaged mid 80s dB with initial cues for AB. SLP talked with pt in 5-7 minute conversational segments in which pt req'd initial min cues for AB and louder speech. In the last 2 segments pt req'd min-mod cues for loudness and AB, rarely - likely due to fatigue.  SLP reminded pt to swallow first, and not wipe, if he feels himself with anterior labial saliva leakage.  06/19/22: Pt with speech volume average low-mid 60s dB. Loud /a/ averaged mid-90s dB with cues to mitigate loudness decay, and everyday sentences averaged mid 80s dB with initial cues for AB. Pt provided sentence responses with mid 60s dB to upper 60s dB after SLP cued pt for breath support and  occasional mod cues for loudness.  SLP encouraged pt to cont to perform HEP BID.   06/06/22: Pt arrived with speech volume average mid-upper 60s dB. Loud /a/ averaged mid-90s dB, and everyday sentences averaged mis-upper 80s dB with initial cues for AB. In simple-mod complex semi-structured sentence responses pt maintained avergae loudness with AB upper 60s dB. In conversational segments pt averaged mid-upper 60s dB with rare min cues for AB.  05/30/22: Pt arrived with suboptimal speech volume. Loud /a/ averaged mid-90s dB, and everyday sentences averaged mis-upper 80s dB with initial cues for AB. In short conversational segments of 6 minutes x4, pt generated WNL volume for the first segment, and then WNL volume with decr'ing times as the segments progressed, likely due to fatigue.  05/25/22: Loud /a/ at average mid 90s dB, and everyday sentences averaged upper 80s dB with initial cues for AB. Pt independent with AB and with louder speech in sentences - no cues necessary from SLP.  Short 3-minute conversational segments with SLP proved mostly successful with pt average loudness in upper 60s- lower 70s with average one cue during each segment.  SLP heard dysfluency x2 today - x4 rhythmic, and x3 rhythmic initial phoneme reps.   PATIENT EDUCATION: Education details: see "today's treatment" Person educated: Patient Education method: Explanation and Demonstration Education comprehension: verbalized understanding  HOME EXERCISE PROGRAM: Loud /a/ and everyday sentences.    GOALS: Goals reviewed with patient? No  SHORT TERM GOALS: Target date: 06/09/22  Produce /a/ with average upper 80s dB over three sessions Baseline:05-16-22, 05-18-22 Goal status: Met  2.  pt will demo abdominal breathing with sentence responses 85% accuracy x 3 sessions  Baseline: 05/25/22, 06/07/22 Goal status: Met  3.  pt will generate 5 minutes simple conversation with low 70s dB average in 3 sessions  Baseline: 06/07/22,   Goal status: Partially met 4.  Pt will use strategies for dysfluency in 1-2 minute short conversational segments  Baseline:  Goal status: Deferred - no difficulty with dysfluency in ST   LONG TERM GOALS: Target date: 07/19/21, 08/11/22  pt will  produce average upper 60s dB in 10 minutes simple to mod complex conversation in 5 sessions, with occasional min A Baseline: 07/07/22 Goal status: Partially met  2.  pt will maintain upper 60s dB average in 10 minute simple conversation x 3 sessions, with rare min A Baseline:  Goal status: not met  3.  pt will use abdominal breathing >80% of the time during 10 minute simple conversation in 3 sessions  Baseline: 06/28/22, 07/07/22 Goal status: Partially met  4.  pt will report fewer requests to repeat himself than prior to ST in 3 sessions  Baseline:  Goal status: Partially met  5.  Pt will use strategies for dysfluency in 5 minute conversational segments Baseline:  Goal status: Deferred - SLP has not heard dysfluency in any sessions (06/28/22)  6.  Pt will score higher/better on PROM in the last 2 weeks of therapy Baseline:  Goal status: Met  ASSESSMENT:  CLINICAL IMPRESSION: Patient is a 72 y.o. male who was seen today for treatment of dysarthria and communcation deficits in light of Parkinsons Disease. See today's treatment note for details. Social situations remain difficult to communicate in due to difficulty being louder when necessary (dart throwing group). Pt would like d/c today as scheduled visits have run out.   OBJECTIVE IMPAIRMENTS: Objective impairments include executive functioning, aphasia, and dysarthria. These impairments are limiting patient from household responsibilities, ADLs/IADLs, and effectively communicating at home and in community.Factors affecting potential to achieve goals and functional outcome are  none .Marland Kitchen Patient will benefit from skilled SLP services to address above impairments and improve overall  function.  REHAB POTENTIAL: Good  PLAN:   d/c today.  PLANNED INTERVENTIONS: Environmental controls, Cueing hierachy, Cognitive reorganization, Internal/external aids, Oral motor exercises, Functional tasks, Multimodal communication approach, SLP instruction and feedback, Compensatory strategies, and Patient/family education    [facility], [place] 07/19/2022, 1:49 PM

## 2022-08-04 ENCOUNTER — Telehealth: Payer: Self-pay | Admitting: Anesthesiology

## 2022-08-04 NOTE — Telephone Encounter (Signed)
Pt's wife called stating pt has fallen 4 times. States pt legs are cramping, he is jerking and losing balance. States when he's fallen he is not able to get up on his own. States she has questions for Dr Tat.

## 2022-08-04 NOTE — Telephone Encounter (Signed)
Patients wife told me she still has the Entacapone and Seglagine she said she would try anything would you recommend restating these medications ?

## 2022-08-04 NOTE — Telephone Encounter (Signed)
Patient wife not sure what to do.  Patient falling constantly with Walker  Patient having stiffness and cramps Walking on his toes On walker all the time  Just finished 10 Weeks of PT  Patient is jerking now Patient falling asleep often With the prostate cancer patient needs hospital bed Patient not having off times just all around bad times  Patients wife still has Entacapone and Selegine Patients wife wanting to know if she should contact PCP or what to do she is at at a loss of how to help him

## 2022-08-04 NOTE — Telephone Encounter (Signed)
Called and let patients wife know about CX list

## 2022-08-08 DIAGNOSIS — Z03818 Encounter for observation for suspected exposure to other biological agents ruled out: Secondary | ICD-10-CM | POA: Diagnosis not present

## 2022-08-08 DIAGNOSIS — R531 Weakness: Secondary | ICD-10-CM | POA: Diagnosis not present

## 2022-08-08 DIAGNOSIS — R296 Repeated falls: Secondary | ICD-10-CM | POA: Diagnosis not present

## 2022-08-08 DIAGNOSIS — Z2089 Contact with and (suspected) exposure to other communicable diseases: Secondary | ICD-10-CM | POA: Diagnosis not present

## 2022-08-08 DIAGNOSIS — Z6826 Body mass index (BMI) 26.0-26.9, adult: Secondary | ICD-10-CM | POA: Diagnosis not present

## 2022-08-08 DIAGNOSIS — R030 Elevated blood-pressure reading, without diagnosis of hypertension: Secondary | ICD-10-CM | POA: Diagnosis not present

## 2022-08-08 DIAGNOSIS — J069 Acute upper respiratory infection, unspecified: Secondary | ICD-10-CM | POA: Diagnosis not present

## 2022-08-08 DIAGNOSIS — G20C Parkinsonism, unspecified: Secondary | ICD-10-CM | POA: Diagnosis not present

## 2022-08-09 NOTE — Progress Notes (Deleted)
Assessment/Plan:   1.  Parkinsons Disease  -Increase carbidopa/levodopa 25/100, 2 tablets at 9 AM, 11:30 AM, 2 PM, 4:30 PM, 7 PM.  This is 2 extra levodopa compared to previous.  -Continue carbidopa/levodopa 50/200 CR at bedtime.  He goes to bed around 10:30-11pm.    -We may need to consider retrying ropinirole in the future  -We can consider opicapone in the future.  -Patient knows that I do not care for the restore gold, but he has apparently had trouble getting off of it.  -Discussed with him that we could consider surgical intervention in the future, but those would not help balance.   2.  Sialorrhea  -Using Myobloc.  Last injections, July 14, 2022.  3.  Dysphagia  -Modified barium swallow done December 22, 2021 was unremarkable.  -discussed meds in applesauce or ice cream   Subjective:   Matthew [name] was seen today in follow up for Parkinsons disease.  My previous records were reviewed prior to todays visit as well as outside records available to me.  Wife with patient and supplements hx. patient seen in January.  They requested a work in today because he was not doing well.  saw his primary care physician 2 days ago.   He had a urinalysis that was reported to be negative.  Patient reports that he has had multiple falls recently.  Legs are cramping up.  He has more trouble getting up on his own.  We told him last visit that he could take up to 2 extra half tablets of levodopa if needed and he reports today that ***.   Current prescribed movement disorder medications: carbidopa/levodopa 25/100, 2 tablets at 9am/12noon/3pm/6pm. Carbidopa/levodopa 50/200 CR at bed    PREVIOUS MEDICATIONS: Requip (headache, drowsiness, incontinence at only 0.25 mg dose); rotigotine (feet/leg swelling); pramipexole (reported feet/ankles swelling at low-dose); selegeline (they thought it caused dizziness); entacapone (I discontinued when patient complained about sleepiness); inbrija (tried but  didn't think helped);  ALLERGIES:   Allergies  Allergen Reactions   Other     Senior dose of flu shot, cause hear palpitations, anxious   Peanut-Containing Drug Products Other (See Comments)    Nasal congestion   Ropinirole Other (See Comments)    Headache drowsiness trouble urinating     CURRENT MEDICATIONS:  Outpatient Encounter Medications as of 08/11/2022  Medication Sig   Ascorbic Acid (VITAMIN C) 1000 MG tablet Take 1,000 mg by mouth daily.   Botulinum Toxin Type B (MYOBLOC) 5000 UNIT/ML SOLN Inject 5000 units into the face and neck every 120 days by the doctor   calcium carbonate (OS-CAL) 1250 (500 Ca) MG chewable tablet Chew 1 tablet by mouth daily.   carbidopa-levodopa (SINEMET CR) 50-200 MG tablet TAKE ONE TABLET BY MOUTH EVERYDAY AT BEDTIME   carbidopa-levodopa (SINEMET IR) 25-100 MG tablet 2 a 9am/noon/3pm/6pm.  May take two extra 1/2 tablets in day   docusate sodium (COLACE) 100 MG capsule Take 100 mg by mouth 2 (two) times daily.   fluticasone (FLONASE) 50 MCG/ACT nasal spray Place 1-2 sprays into both nostrils daily as needed for allergies or rhinitis.   Levodopa (INBRIJA) 42 MG CAPS Samples of this drug were given to the patient, quantity 1, Lot Number [id number] EXP 04/2023 (Patient not taking: Reported on 06/13/2022)   loratadine (CLARITIN) 10 MG tablet Take 10 mg by mouth daily.   mirabegron ER (MYRBETRIQ) 50 MG TB24 tablet Take 50 mg by mouth daily.   Multiple Vitamin (MULTIVITAMIN) tablet Take 1  tablet by mouth daily.   OVER THE COUNTER MEDICATION Take 4-6 tablets by mouth in the morning, at noon, and at bedtime. RESTORE GOLD 6 tablet in am, 6 tab at lunch and 4 tablet in evening   vitamin B-12 (CYANOCOBALAMIN) 500 MCG tablet Take 500 mcg by mouth daily.   zinc gluconate 50 MG tablet Take 50 mg by mouth daily.   No facility-administered encounter medications on file as of 08/11/2022.    Objective:   PHYSICAL EXAMINATION:    VITALS:   There were no vitals filed  for this visit.    GEN:  The patient appears stated age and is in NAD.  Has some emotional lability. HEENT:  Normocephalic, atraumatic.  The mucous membranes are moist. The superficial temporal arteries are without ropiness or tenderness.  No drooling at all.  Neurological examination:  Orientation: The patient is alert and oriented x3. Cranial nerves: There is good facial symmetry with facial hypomimia. The speech is fluent and clear. Soft palate rises symmetrically and there is no tongue deviation. Hearing is intact to conversational tone. Sensation: Sensation is intact to light touch throughout Motor: Strength is at least antigravity x4.  Movement examination: Tone: There is mild increased tone in the RUE>LUE (he is due for medication) Abnormal movements: none, even with distraction procedures Coordination:  There is decremation with finger taps bilaterally and hand opening and closing bilaterally.   Gait and Station: The patient arises slowly without the use of the hands.  He ambulates well with the walker but he is festinating.  I have reviewed and interpreted the following labs independently    Chemistry      Component Value Date/Time   NA 135 05/06/2019 0230   K 4.0 05/06/2019 0230   CL 104 05/06/2019 0230   CO2 24 05/06/2019 0230   BUN 22 05/06/2019 0230   CREATININE 0.98 05/06/2019 0230      Component Value Date/Time   CALCIUM 8.7 (L) 05/06/2019 0230       Lab Results  Component Value Date   WBC 9.6 05/06/2019   HGB 9.9 (L) 05/06/2019   HCT 28.9 (L) 05/06/2019   MCV 94.4 05/06/2019   PLT 141 (L) 05/06/2019    No results found for: "TSH"   Total time spent on today's visit was *** minutes, including both face-to-face time and nonface-to-face time.  Time included that spent on review of records (prior notes available to me/labs/imaging if pertinent), discussing treatment and goals, answering patient's questions and coordinating care.  Cc:  Lawerance Cruel, MD

## 2022-08-11 ENCOUNTER — Ambulatory Visit: Payer: PPO | Admitting: Neurology

## 2022-08-11 NOTE — Progress Notes (Signed)
Assessment/Plan:   1.  Parkinsons Disease  -Increase carbidopa/levodopa 25/100, 2 tablets at 9 AM, 11:30 AM, 2 PM, 4:30 PM, 7 PM.  This is 2 extra levodopa compared to previous.  -Continue carbidopa/levodopa 50/200 CR at bedtime.  He goes to bed around 10:30-11pm.    -We may need to consider retrying ropinirole in the future discussed discussed with patient and wife today.  They were agreeable to considering that.  -We can consider opicapone in the future.  -I again really would like to get rid of the restore gold.  Its very hard to balance his levodopa while taking this.  I think it contributes to the EDS but its difficult to know how much DA he gets.  he has had trouble getting off of it.  He was agreeable today to try to get off it again.   2.  Sialorrhea  -Using Myobloc.  Last injections, July 14, 2022.  3.  Dysphagia  -Modified barium swallow done December 22, 2021 was unremarkable.  4.  Urinary incontinence  -They have an appointment with urology to discuss.  This is not all Parkinson's related.  He has had prostate cancer with radiation Subjective:   Matthew [name] was seen today in follow up for Parkinsons disease.  My previous records were reviewed prior to todays visit as well as outside records available to me.  Wife with patient and supplements hx. patient seen in January.  They requested a work in today because he was not doing well.  saw his primary care physician 2 days ago.   He had a urinalysis that was reported to be negative.  Patient reports that he has had multiple falls recently (6 in the last 2 weeks).  With one, he was in the kitchen and had let go of the walker to hold onto the counter and lost balance and fell.  He says he didn't hit his head and wife says he did - "I landed on my butt real hard."  Called son to get him up.  One fall, he had gotten up from the table in kitchen and grabbed walker and fell.  Wife states that two times he fell asleep and fell.  I  asked if he passed out and wife said "no."  He said he felt tired so he closed his eyes while standing and then just fell.  He has not fallen this week.    Legs are cramping up.  He has more trouble getting up on his own.  We told him last visit that he could take up to 2 extra half tablets of levodopa if needed and he reports today that he does that "on occasion" and they think that it awakens him in the evening.  He reports "botox worked better this time" but wife states that it only worked for 2 weeks.     Current prescribed movement disorder medications: carbidopa/levodopa 25/100, 2 tablets at 9am/12noon/3pm/6pm. Carbidopa/levodopa 50/200 CR at bed    PREVIOUS MEDICATIONS: Requip (headache, drowsiness, incontinence at only 0.25 mg dose); rotigotine (feet/leg swelling); pramipexole (reported feet/ankles swelling at low-dose); selegeline (they thought it caused dizziness); entacapone (I discontinued when patient complained about sleepiness); inbrija (tried but didn't think helped);  ALLERGIES:   Allergies  Allergen Reactions   Other     Senior dose of flu shot, cause hear palpitations, anxious   Peanut-Containing Drug Products Other (See Comments)    Nasal congestion   Ropinirole Other (See Comments)    Headache drowsiness  trouble urinating     CURRENT MEDICATIONS:  Outpatient Encounter Medications as of 08/14/2022  Medication Sig   Botulinum Toxin Type B (MYOBLOC) 5000 UNIT/ML SOLN Inject 5000 units into the face and neck every 120 days by the doctor   calcium carbonate (OS-CAL) 1250 (500 Ca) MG chewable tablet Chew 1 tablet by mouth daily.   carbidopa-levodopa (SINEMET CR) 50-200 MG tablet TAKE ONE TABLET BY MOUTH EVERYDAY AT BEDTIME   carbidopa-levodopa (SINEMET IR) 25-100 MG tablet 2 a 9am/noon/3pm/6pm.  May take two extra 1/2 tablets in day   docusate sodium (COLACE) 100 MG capsule Take 100 mg by mouth 2 (two) times daily.   fluticasone (FLONASE) 50 MCG/ACT nasal spray Place 1-2  sprays into both nostrils daily as needed for allergies or rhinitis.   loratadine (CLARITIN) 10 MG tablet Take 10 mg by mouth daily.   mirabegron ER (MYRBETRIQ) 50 MG TB24 tablet Take 50 mg by mouth daily.   Multiple Vitamin (MULTIVITAMIN) tablet Take 1 tablet by mouth daily.   OVER THE COUNTER MEDICATION Take 4-6 tablets by mouth in the morning, at noon, and at bedtime. RESTORE GOLD 6 tablet in am, 6 tab at lunch and 4 tablet in evening   vitamin B-12 (CYANOCOBALAMIN) 500 MCG tablet Take 500 mcg by mouth daily.   zinc gluconate 50 MG tablet Take 50 mg by mouth daily.   [DISCONTINUED] Ascorbic Acid (VITAMIN C) 1000 MG tablet Take 1,000 mg by mouth daily.   [DISCONTINUED] Levodopa (INBRIJA) 42 MG CAPS Samples of this drug were given to the patient, quantity 1, Lot Number [id number] [id number] 04/2023 (Patient not taking: Reported on 06/13/2022)   No facility-administered encounter medications on file as of 08/14/2022.    Objective:   PHYSICAL EXAMINATION:    VITALS:   Vitals:   08/14/22 1053  BP: 118/80  Pulse: 68  SpO2: 96%  Weight: 157 lb 6.4 oz (71.4 kg)  Height: 5\' 6"  (1.676 m)      GEN:  The patient appears stated age and is in NAD.   HEENT:  Normocephalic, atraumatic.  The mucous membranes are moist. The superficial temporal arteries are without ropiness or tenderness.  No drooling at all.  Neurological examination:  Orientation: The patient is alert and oriented x3. Cranial nerves: There is good facial symmetry with facial hypomimia. The speech is fluent and clear. Soft palate rises symmetrically and there is no tongue deviation. Hearing is intact to conversational tone. Sensation: Sensation is intact to light touch throughout Motor: Strength is at least antigravity x4.  Movement examination: Tone: There is moderate increased tone in the left upper extremity/left lower extremity.  He is due for medication. Abnormal movements: Rapid alternating movements are slow. Coordination:   There is decremation with finger taps bilaterally and hand opening and closing bilaterally.   Gait and Station: The patient arises slowly without the use of the hands.  He ambulates fairly well with a walker.  He is stooped.  I have reviewed and interpreted the following labs independently    Chemistry      Component Value Date/Time   NA 135 05/06/2019 0230   K 4.0 05/06/2019 0230   CL 104 05/06/2019 0230   CO2 24 05/06/2019 0230   BUN 22 05/06/2019 0230   CREATININE 0.98 05/06/2019 0230      Component Value Date/Time   CALCIUM 8.7 (L) 05/06/2019 0230       Lab Results  Component Value Date   WBC 9.6 05/06/2019   HGB  9.9 (L) 05/06/2019   HCT 28.9 (L) 05/06/2019   MCV 94.4 05/06/2019   PLT 141 (L) 05/06/2019    No results found for: "TSH"   Total time spent on today's visit was 42 minutes, including both face-to-face time and nonface-to-face time.  Time included that spent on review of records (prior notes available to me/labs/imaging if pertinent), discussing treatment and goals, answering patient's questions and coordinating care.  Cc:  Daisy Floro, MD

## 2022-08-14 ENCOUNTER — Ambulatory Visit (INDEPENDENT_AMBULATORY_CARE_PROVIDER_SITE_OTHER): Payer: PPO | Admitting: Neurology

## 2022-08-14 ENCOUNTER — Encounter: Payer: Self-pay | Admitting: Neurology

## 2022-08-14 VITALS — BP 118/80 | HR 68 | Ht 66.0 in | Wt 157.4 lb

## 2022-08-14 DIAGNOSIS — G20A2 Parkinson's disease without dyskinesia, with fluctuations: Secondary | ICD-10-CM | POA: Diagnosis not present

## 2022-08-14 MED ORDER — CARBIDOPA-LEVODOPA 25-100 MG PO TABS: ORAL_TABLET | ORAL | 1 refills | Status: DC

## 2022-08-14 NOTE — Patient Instructions (Addendum)
Increase carbidopa/levodopa 25/100, 2 tablets at 9 AM, 11:30 AM, 2 PM, 4:30 PM, 7 PM.  This is 2 extra levodopa compared to previous.  We will consider the addition of ropinirole

## 2022-08-16 ENCOUNTER — Ambulatory Visit: Payer: PPO | Admitting: Neurology

## 2022-08-30 ENCOUNTER — Other Ambulatory Visit (HOSPITAL_COMMUNITY): Payer: Self-pay

## 2022-08-31 ENCOUNTER — Other Ambulatory Visit (HOSPITAL_COMMUNITY): Payer: Self-pay

## 2022-09-08 DIAGNOSIS — B351 Tinea unguium: Secondary | ICD-10-CM | POA: Diagnosis not present

## 2022-09-08 DIAGNOSIS — M792 Neuralgia and neuritis, unspecified: Secondary | ICD-10-CM | POA: Diagnosis not present

## 2022-09-08 DIAGNOSIS — I739 Peripheral vascular disease, unspecified: Secondary | ICD-10-CM | POA: Diagnosis not present

## 2022-09-08 DIAGNOSIS — R600 Localized edema: Secondary | ICD-10-CM | POA: Diagnosis not present

## 2022-09-15 DIAGNOSIS — H0011 Chalazion right upper eyelid: Secondary | ICD-10-CM | POA: Diagnosis not present

## 2022-09-19 ENCOUNTER — Other Ambulatory Visit (HOSPITAL_COMMUNITY): Payer: Self-pay

## 2022-09-27 ENCOUNTER — Other Ambulatory Visit: Payer: Self-pay | Admitting: Neurology

## 2022-09-27 ENCOUNTER — Other Ambulatory Visit: Payer: Self-pay

## 2022-09-27 ENCOUNTER — Other Ambulatory Visit (HOSPITAL_COMMUNITY): Payer: Self-pay

## 2022-09-27 DIAGNOSIS — K117 Disturbances of salivary secretion: Secondary | ICD-10-CM

## 2022-09-27 MED ORDER — MYOBLOC 5000 UNIT/ML IM SOLN
INTRAMUSCULAR | 0 refills | Status: DC
Start: 2022-09-27 — End: 2022-12-22
  Filled 2022-09-27: qty 1, 100d supply, fill #0

## 2022-09-29 ENCOUNTER — Ambulatory Visit: Payer: PPO | Admitting: Neurology

## 2022-10-03 ENCOUNTER — Other Ambulatory Visit (HOSPITAL_COMMUNITY): Payer: Self-pay

## 2022-10-03 ENCOUNTER — Other Ambulatory Visit: Payer: Self-pay

## 2022-10-04 ENCOUNTER — Other Ambulatory Visit: Payer: Self-pay

## 2022-10-13 ENCOUNTER — Ambulatory Visit (INDEPENDENT_AMBULATORY_CARE_PROVIDER_SITE_OTHER): Payer: PPO | Admitting: Neurology

## 2022-10-13 ENCOUNTER — Ambulatory Visit: Payer: PPO | Admitting: Neurology

## 2022-10-13 DIAGNOSIS — K117 Disturbances of salivary secretion: Secondary | ICD-10-CM | POA: Diagnosis not present

## 2022-10-13 MED ORDER — RIMABOTULINUMTOXINB 5000 UNIT/ML IM SOLN
5000.0000 [IU] | Freq: Once | INTRAMUSCULAR | Status: AC
Start: 2022-10-13 — End: 2022-10-13
  Administered 2022-10-13: 5000 [IU] via INTRAMUSCULAR

## 2022-10-13 NOTE — Procedures (Signed)
Botulinum Clinic    History:  Diagnosis: Sialorrhea    Result History  Has worn off.  Carrying drool cloth  Consent obtained from: The patient The patient was educated on the botulinum toxin the black blox warning and given a copy of the botox patient medication guide.  The patient understands that this warning states that there have been reported cases of the Botox extending beyond the injection site and creating adverse effects, similar to those of botulism. This included loss of strength, trouble walking, hoarseness, trouble saying words clearly, loss of bladder control, trouble breathing, trouble swallowing, diplopia, blurry vision and ptosis. Most of the distant spread of Botox was happening in patients, primarily children, who received medication for spasticity or for cervical dystonia. The patient expressed understanding and desire to proceed.     Injections  Location Left  Right Units Number of sites  Submandibular gland 250 250 500 1 per side  Parotid 2250 2250 2500 1 per side  TOTAL UNITS:     5000      Type of Toxin: Myobloc type B As ordered and injected IM at today's visit Total Units: 5000  Discarded Units: 0  Needle drawback with each injection was free of blood. Pt tolerated procedure well without complications.   Reinjection is anticipated in 3 months.

## 2022-10-17 DIAGNOSIS — R7309 Other abnormal glucose: Secondary | ICD-10-CM | POA: Diagnosis not present

## 2022-10-17 DIAGNOSIS — E7800 Pure hypercholesterolemia, unspecified: Secondary | ICD-10-CM | POA: Diagnosis not present

## 2022-10-17 DIAGNOSIS — I10 Essential (primary) hypertension: Secondary | ICD-10-CM | POA: Diagnosis not present

## 2022-10-20 DIAGNOSIS — Z0000 Encounter for general adult medical examination without abnormal findings: Secondary | ICD-10-CM | POA: Diagnosis not present

## 2022-10-20 DIAGNOSIS — Z79899 Other long term (current) drug therapy: Secondary | ICD-10-CM | POA: Diagnosis not present

## 2022-10-20 DIAGNOSIS — G20A2 Parkinson's disease without dyskinesia, with fluctuations: Secondary | ICD-10-CM | POA: Diagnosis not present

## 2022-10-20 DIAGNOSIS — Z6826 Body mass index (BMI) 26.0-26.9, adult: Secondary | ICD-10-CM | POA: Diagnosis not present

## 2022-10-20 DIAGNOSIS — M549 Dorsalgia, unspecified: Secondary | ICD-10-CM | POA: Diagnosis not present

## 2022-10-20 DIAGNOSIS — R7309 Other abnormal glucose: Secondary | ICD-10-CM | POA: Diagnosis not present

## 2022-10-20 DIAGNOSIS — E7800 Pure hypercholesterolemia, unspecified: Secondary | ICD-10-CM | POA: Diagnosis not present

## 2022-11-14 ENCOUNTER — Ambulatory Visit: Payer: PPO | Admitting: Neurology

## 2022-11-22 DIAGNOSIS — C61 Malignant neoplasm of prostate: Secondary | ICD-10-CM | POA: Diagnosis not present

## 2022-11-30 DIAGNOSIS — C61 Malignant neoplasm of prostate: Secondary | ICD-10-CM | POA: Diagnosis not present

## 2022-11-30 DIAGNOSIS — N3946 Mixed incontinence: Secondary | ICD-10-CM | POA: Diagnosis not present

## 2022-12-13 DIAGNOSIS — M792 Neuralgia and neuritis, unspecified: Secondary | ICD-10-CM | POA: Diagnosis not present

## 2022-12-13 DIAGNOSIS — B351 Tinea unguium: Secondary | ICD-10-CM | POA: Diagnosis not present

## 2022-12-13 DIAGNOSIS — I739 Peripheral vascular disease, unspecified: Secondary | ICD-10-CM | POA: Diagnosis not present

## 2022-12-13 DIAGNOSIS — R600 Localized edema: Secondary | ICD-10-CM | POA: Diagnosis not present

## 2022-12-15 NOTE — Progress Notes (Unsigned)
Assessment/Plan:   1.  Parkinsons Disease  -Continue carbidopa/levodopa 25/100, 2 tablets at 9 AM, 11:30 AM, 2 PM, 4:30 PM, 7 PM.    -Continue carbidopa/levodopa 50/200 CR at bedtime.  He goes to bed around 10:30-11pm.    -Long discussion about various options.  He is nervous about retrying ropinirole, but I do not think that the side effects that he had at 0.25 mg were really side effects of the medication (incontinence, headache).  He was not sure he wanted to retry it, but his wife wanted him to and after a lengthy discussion he ultimately decided he would go ahead and retry it.  He was given a 1 month titration schedule to workup the ropinirole, 1 mg 3 times per day.  R/b/se discussed.  -We can consider opicapone in the future.  -glad to see he is off of restore gold  -he is on lithium orotate OTC, which can produce tremor, but we haven't seen that.  Just started that  -he asked about DBS but I didn't recommend today.  Discussed that DBS therapy does not do better than medication when it is working its best.  He really does not have any motor fluctuations or medication side effects/dyskinesia.   2.  Sialorrhea  -Using Myobloc.  Last injections, Oct 13, 2022.  He thinks only for few weeks.  Told him we can hold off on the Botox and see how he does, but they opted to continue this for now.  3.  Dysphagia  -Modified barium swallow done December 22, 2021 was unremarkable.  -may have to consider repeat in future  4.  Urinary incontinence  -Following with urology this is not all Parkinson's related.  He has had prostate cancer with radiation Subjective:   Matthew [name] was seen today in follow up for Parkinsons disease.  My previous records were reviewed prior to todays visit as well as outside records available to me.  Wife with patient and supplements hx. last visit, I increased the patient's levodopa by 2 tablets/day.  We also discussed the restore gold, which he has had difficulty  getting off of, but has been difficult to regulate.  He reports today that he is off of the restore gold.  He has started lithium orotate.  They state that it helped the dreams and they think it helped the myoclonic jerks.  They have only been on this dose 2 weeks.  He is also on CDP-Choline.  He cannot do all his ADL's - getting dressed, shower, putting on depends and they are hoping that the addition of the supplements will help.  He has had falls, 2 this month, 2 last month and 7 the 2 months prior (so doing better recently).  He is less sleepy than he was.  He uses transport chair out of the house.  Hasn't had serious injury with falls.  No confusion/hallucinations.  "His brain is working 100%."  He c/o neck and LBP   Current prescribed movement disorder medications: carbidopa/levodopa 25/100, 2 tablets at 9 AM, 11:30 AM, 2 PM, 4:30 PM, 7 PM Carbidopa/levodopa 50/200 CR at bed    PREVIOUS MEDICATIONS: Requip (headache, drowsiness, incontinence at only 0.25 mg dose); rotigotine (feet/leg swelling); pramipexole (reported feet/ankles swelling at low-dose); selegeline (they thought it caused dizziness); entacapone (I discontinued when patient complained about sleepiness); inbrija (tried but didn't think helped);  ALLERGIES:   Allergies  Allergen Reactions   Other     Senior dose of flu shot, cause hear  palpitations, anxious   Peanut-Containing Drug Products Other (See Comments)    Nasal congestion   Ropinirole Other (See Comments)    Headache drowsiness trouble urinating     CURRENT MEDICATIONS:  Outpatient Encounter Medications as of 12/19/2022  Medication Sig   Botulinum Toxin Type B (MYOBLOC) 5000 UNIT/ML SOLN Inject 5000 units into the face and neck every 120 days by the doctor   calcium carbonate (OS-CAL) 1250 (500 Ca) MG chewable tablet Chew 1 tablet by mouth daily.   carbidopa-levodopa (SINEMET CR) 50-200 MG tablet TAKE ONE TABLET BY MOUTH EVERYDAY AT BEDTIME   carbidopa-levodopa  (SINEMET IR) 25-100 MG tablet 2 tablets at 9 AM, 11:30 AM, 2 PM, 4:30 PM, 7 PM   Coenzyme Q10 (CO Q-10) 100 MG CAPS Take by mouth.   docusate sodium (COLACE) 100 MG capsule Take 100 mg by mouth 2 (two) times daily.   fluticasone (FLONASE) 50 MCG/ACT nasal spray Place 1-2 sprays into both nostrils daily as needed for allergies or rhinitis.   Lithium Orotate 5 MG CAPS Take 1 tablet by mouth 3 (three) times daily as needed.   loratadine (CLARITIN) 10 MG tablet Take 10 mg by mouth daily.   NON FORMULARY Take 1 tablet by mouth daily. CDP Collate 500 mg   Omega-3 Fatty Acids (OMEGA 3 500 PO) Take by mouth.   vitamin B-12 (CYANOCOBALAMIN) 500 MCG tablet Take 1,000 mcg by mouth daily.   zinc gluconate 50 MG tablet Take 50 mg by mouth daily.   [DISCONTINUED] mirabegron ER (MYRBETRIQ) 50 MG TB24 tablet Take 50 mg by mouth daily. (Patient not taking: Reported on 12/19/2022)   [DISCONTINUED] Multiple Vitamin (MULTIVITAMIN) tablet Take 1 tablet by mouth daily. (Patient not taking: Reported on 12/19/2022)   [DISCONTINUED] OVER THE COUNTER MEDICATION Take 4-6 tablets by mouth in the morning, at noon, and at bedtime. RESTORE GOLD 6 tablet in am, 6 tab at lunch and 4 tablet in evening (Patient not taking: Reported on 12/19/2022)   No facility-administered encounter medications on file as of 12/19/2022.    Objective:   PHYSICAL EXAMINATION:    VITALS:   Vitals:   12/19/22 1257  BP: 112/72  Pulse: (!) 52  SpO2: 95%  Weight: 157 lb (71.2 kg)  Height: 5\' 6"  (1.676 m)    GEN:  The patient appears stated age and is in NAD.   HEENT:  Normocephalic, atraumatic.  The mucous membranes are moist. The superficial temporal arteries are without ropiness or tenderness.  No drooling at all. CV:  RRR Lungs:  CTAB  Neurological examination:  Orientation: The patient is alert and oriented x3. Cranial nerves: There is good facial symmetry with facial hypomimia. The speech is fluent and dysarthric and hypophonic.  This  is stable from prior visit.  Soft palate rises symmetrically and there is no tongue deviation. Hearing is intact to conversational tone. Sensation: Sensation is intact to light touch throughout Motor: Strength is at least antigravity x4.  Movement examination: Tone: There is nl tone in the bilateral UE and RLE and mild increased tone in the LLE (improved) Abnormal movements: none Coordination:  there is decremation with toe taps on the L  Gait and Station: The patient by pushing off.  He ambulates fairly well with a walker.  He is stooped.  I have reviewed and interpreted the following labs independently    Chemistry      Component Value Date/Time   NA 135 05/06/2019 0230   K 4.0 05/06/2019 0230  CL 104 05/06/2019 0230   CO2 24 05/06/2019 0230   BUN 22 05/06/2019 0230   CREATININE 0.98 05/06/2019 0230      Component Value Date/Time   CALCIUM 8.7 (L) 05/06/2019 0230       Lab Results  Component Value Date   WBC 9.6 05/06/2019   HGB 9.9 (L) 05/06/2019   HCT 28.9 (L) 05/06/2019   MCV 94.4 05/06/2019   PLT 141 (L) 05/06/2019    No results found for: "TSH"   Total time spent on today's visit was 54 minutes, including both face-to-face time and nonface-to-face time.  Time included that spent on review of records (prior notes available to me/labs/imaging if pertinent), discussing treatment and goals, answering patient's questions and coordinating care.  Cc:  Daisy Floro, MD

## 2022-12-19 ENCOUNTER — Encounter: Payer: Self-pay | Admitting: Neurology

## 2022-12-19 ENCOUNTER — Ambulatory Visit: Payer: PPO | Admitting: Neurology

## 2022-12-19 VITALS — BP 112/72 | HR 52 | Ht 66.0 in | Wt 157.0 lb

## 2022-12-19 DIAGNOSIS — K117 Disturbances of salivary secretion: Secondary | ICD-10-CM

## 2022-12-19 DIAGNOSIS — G20A2 Parkinson's disease without dyskinesia, with fluctuations: Secondary | ICD-10-CM

## 2022-12-19 MED ORDER — ROPINIROLE HCL 1 MG PO TABS
ORAL_TABLET | ORAL | 0 refills | Status: DC
Start: 2022-12-19 — End: 2023-04-10

## 2022-12-19 MED ORDER — ROPINIROLE HCL 0.25 MG PO TABS
ORAL_TABLET | ORAL | 0 refills | Status: DC
Start: 2022-12-19 — End: 2023-06-26

## 2022-12-19 NOTE — Patient Instructions (Addendum)
start requip (ropinirole) as follows:  requip 0.25 mg three times a day x 1 week and then 0.5 mg tid x three times a day for a week and then 0.75 mg three times a day x 1 week and then 1.0 mg tid thereafter    SAVE THE DATE!  We are planning a Parkinsons Disease educational symposium at [facility] in [place] on October 11.  More details to come!  If you would like to be added to our email list to get further information, email [email or web address].  We hope to see you there!

## 2022-12-20 ENCOUNTER — Other Ambulatory Visit (HOSPITAL_COMMUNITY): Payer: Self-pay

## 2022-12-22 ENCOUNTER — Other Ambulatory Visit: Payer: Self-pay

## 2022-12-22 ENCOUNTER — Other Ambulatory Visit: Payer: Self-pay | Admitting: Neurology

## 2022-12-22 DIAGNOSIS — K117 Disturbances of salivary secretion: Secondary | ICD-10-CM

## 2022-12-25 ENCOUNTER — Other Ambulatory Visit: Payer: Self-pay

## 2022-12-25 MED ORDER — MYOBLOC 5000 UNIT/ML IM SOLN
INTRAMUSCULAR | 2 refills | Status: DC
  Filled 2022-12-25: qty 1, 100d supply, fill #0
  Filled 2023-03-29: qty 1, 100d supply, fill #1
  Filled 2023-06-27: qty 1, 100d supply, fill #2

## 2022-12-26 ENCOUNTER — Other Ambulatory Visit (HOSPITAL_COMMUNITY): Payer: Self-pay

## 2023-01-04 ENCOUNTER — Other Ambulatory Visit: Payer: Self-pay

## 2023-01-04 ENCOUNTER — Other Ambulatory Visit (HOSPITAL_COMMUNITY): Payer: Self-pay

## 2023-01-08 ENCOUNTER — Other Ambulatory Visit: Payer: Self-pay

## 2023-01-12 ENCOUNTER — Ambulatory Visit: Payer: PPO | Admitting: Neurology

## 2023-01-12 ENCOUNTER — Other Ambulatory Visit: Payer: Self-pay | Admitting: Neurology

## 2023-01-12 DIAGNOSIS — K117 Disturbances of salivary secretion: Secondary | ICD-10-CM | POA: Diagnosis not present

## 2023-01-12 DIAGNOSIS — G20A2 Parkinson's disease without dyskinesia, with fluctuations: Secondary | ICD-10-CM

## 2023-01-12 MED ORDER — RIMABOTULINUMTOXINB 5000 UNIT/ML IM SOLN
5000.0000 [IU] | Freq: Once | INTRAMUSCULAR | Status: AC
Start: 2023-01-12 — End: 2023-01-12
  Administered 2023-01-12: 5000 [IU] via INTRAMUSCULAR

## 2023-01-12 NOTE — Addendum Note (Signed)
Addended by: Ashok Norris on: 01/12/2023 02:14 PM   Modules accepted: Orders

## 2023-01-12 NOTE — Procedures (Signed)
Botulinum Clinic    History:  Diagnosis: Sialorrhea    Result History  Has worn off.  Carrying drool cloth.  Wasn't sure working for long but wanted to continue botox/myobloc  Consent obtained from: The patient The patient was educated on the botulinum toxin the black blox warning and given a copy of the botox patient medication guide.  The patient understands that this warning states that there have been reported cases of the Botox extending beyond the injection site and creating adverse effects, similar to those of botulism. This included loss of strength, trouble walking, hoarseness, trouble saying words clearly, loss of bladder control, trouble breathing, trouble swallowing, diplopia, blurry vision and ptosis. Most of the distant spread of Botox was happening in patients, primarily children, who received medication for spasticity or for cervical dystonia. The patient expressed understanding and desire to proceed.     Injections  Location Left  Right Units Number of sites  Submandibular gland 250 250 500 1 per side  Parotid 2250 2250 2500 1 per side  TOTAL UNITS:     5000      Type of Toxin: Myobloc type B As ordered and injected IM at today's visit Total Units: 5000  Discarded Units: 0  Needle drawback with each injection was free of blood. Pt tolerated procedure well without complications.   Reinjection is anticipated in 3 months.

## 2023-01-17 DIAGNOSIS — L578 Other skin changes due to chronic exposure to nonionizing radiation: Secondary | ICD-10-CM | POA: Diagnosis not present

## 2023-01-17 DIAGNOSIS — L814 Other melanin hyperpigmentation: Secondary | ICD-10-CM | POA: Diagnosis not present

## 2023-01-17 DIAGNOSIS — D485 Neoplasm of uncertain behavior of skin: Secondary | ICD-10-CM | POA: Diagnosis not present

## 2023-01-17 DIAGNOSIS — D229 Melanocytic nevi, unspecified: Secondary | ICD-10-CM | POA: Diagnosis not present

## 2023-01-17 DIAGNOSIS — D1801 Hemangioma of skin and subcutaneous tissue: Secondary | ICD-10-CM | POA: Diagnosis not present

## 2023-01-17 DIAGNOSIS — D23112 Other benign neoplasm of skin of right lower eyelid, including canthus: Secondary | ICD-10-CM | POA: Diagnosis not present

## 2023-01-17 DIAGNOSIS — L821 Other seborrheic keratosis: Secondary | ICD-10-CM | POA: Diagnosis not present

## 2023-02-01 ENCOUNTER — Encounter: Payer: Self-pay | Admitting: Neurology

## 2023-02-02 ENCOUNTER — Other Ambulatory Visit (HOSPITAL_COMMUNITY): Payer: Self-pay

## 2023-03-01 ENCOUNTER — Telehealth: Payer: Self-pay | Admitting: Neurology

## 2023-03-01 NOTE — Telephone Encounter (Signed)
Left message with the after hour service on 03-01-23   Needs to update the pharmacy  no longer uses  [facility]   He now uses [facility]

## 2023-03-01 NOTE — Telephone Encounter (Signed)
Changed.

## 2023-03-06 ENCOUNTER — Ambulatory Visit: Payer: PPO | Attending: Neurology | Admitting: Occupational Therapy

## 2023-03-06 ENCOUNTER — Ambulatory Visit: Payer: PPO

## 2023-03-06 ENCOUNTER — Telehealth: Payer: Self-pay | Admitting: Physical Therapy

## 2023-03-06 ENCOUNTER — Ambulatory Visit: Payer: PPO | Admitting: Physical Therapy

## 2023-03-06 DIAGNOSIS — R293 Abnormal posture: Secondary | ICD-10-CM | POA: Insufficient documentation

## 2023-03-06 DIAGNOSIS — R41841 Cognitive communication deficit: Secondary | ICD-10-CM

## 2023-03-06 DIAGNOSIS — R2681 Unsteadiness on feet: Secondary | ICD-10-CM | POA: Insufficient documentation

## 2023-03-06 DIAGNOSIS — R2689 Other abnormalities of gait and mobility: Secondary | ICD-10-CM | POA: Insufficient documentation

## 2023-03-06 DIAGNOSIS — R4701 Aphasia: Secondary | ICD-10-CM | POA: Insufficient documentation

## 2023-03-06 DIAGNOSIS — G20A2 Parkinson's disease without dyskinesia, with fluctuations: Secondary | ICD-10-CM

## 2023-03-06 DIAGNOSIS — R278 Other lack of coordination: Secondary | ICD-10-CM | POA: Insufficient documentation

## 2023-03-06 DIAGNOSIS — M6281 Muscle weakness (generalized): Secondary | ICD-10-CM | POA: Insufficient documentation

## 2023-03-06 DIAGNOSIS — R1310 Dysphagia, unspecified: Secondary | ICD-10-CM

## 2023-03-06 DIAGNOSIS — R471 Dysarthria and anarthria: Secondary | ICD-10-CM

## 2023-03-06 DIAGNOSIS — R29818 Other symptoms and signs involving the nervous system: Secondary | ICD-10-CM | POA: Insufficient documentation

## 2023-03-06 NOTE — Telephone Encounter (Signed)
Matthew [name] was seen for PT, OT, speech therapy screens in our multi-disciplinary clinic.  Physical therapy is recommended for follow up due to slowed mobility deficits.  Occupational therapy is recommended for follow up due to increased difficulty with ADLs, slowing with coordination and self-feeding.  Speech therapy is recommended for hypophonia and dysphagia with liquids and pills. If you agree, please send referral via Epic for PT, OT, speech therapy eval and treat.  Thank you.   Lonia Blood, PT 03/06/23 12:39 PM Phone: [phone] Fax: [phone]   [facility] [address] [place], [address] [place], [place] [address] Phone # [phone] Fax # [phone]

## 2023-03-06 NOTE — Therapy (Signed)
[place] [facility] [address] [place], [place], [address] Phone: [phone]   Fax:  [phone]  Patient Details  Name: Matthew [name] MRN: [id number] Date of Birth: 20-Jul-1950 Referring Provider:  Vladimir Faster, DO  Encounter Date: 03/06/2023  Occupational Therapy Parkinson's Disease Screen  Hand dominance:  right   Physical Performance Test item #2 (simulated eating):  31.84 sec  Physical Performance Test item #4 (donning/doffing jacket):  Unable (requiring > 5 mins during last bout of therapy  9-hole peg test:    RUE  2:38 (complete 6 in 2 min time limit and then began sliding pegs vs picking up to complete)      LUE  1:19  Change in ability to perform ADLs/IADLs:  Pt reports still having difficulty with getting OOB, despite use of hospital bed.  Requiring increased assistance and time with dressing and eating.  Pt spilling more with eating.    Pt would benefit from occupational therapy evaluation due to  increased difficulty with ADLs, slowing with coordination and self-feeding   Rosalio Loud, OT 03/06/2023, 12:17 PM  [place] [facility] [address] [place], [place], [address] Phone: [phone]   Fax:  [phone]

## 2023-03-06 NOTE — Therapy (Signed)
[facility] [address] [place], [place], [address] Phone: [phone]   Fax:  [phone]  Patient Details  Name: Matthew [name] MRN: [id number] Date of Birth: 1951-04-01 Referring Provider:  Vladimir Faster, DO  Encounter Date: 03/06/2023  Physical Therapy Parkinson's Disease Screen  Timed Up and Go test:26 sec with 4WW (compared to 15 sec)  10 meter walk test:15.31 sec = 2.14 ft/sec  (compared to 2.01 ft/sec)  5 time sit to stand test:18.16 sec-(compared to 11 sec)  Patient would benefit from Physical Therapy evaluation due to slowed mobility measures, some reports of falls.   No falls recently; did have about 5 falls in one week (several months ago).  "Balance is terrible".  Doing HEP from previous therapy, dance exercises online.   Chenita Ruda W., PT 03/06/2023, 11:24 AM  [place] [facility] [address] [place], [place], [address] Phone: [phone]   Fax:  [phone]

## 2023-03-06 NOTE — Therapy (Signed)
[place] [facility] [address], [address] [place], [place], [address] Phone: [phone]   Fax:  [phone]  Patient Details  Name: Matthew [name] MRN: [id number] Date of Birth: [birth year] Referring Provider:  Kerin Salen, DO  Encounter Date: 03/06/2023  Speech Therapy Parkinson's Disease Screen   Decibel Level today: 66dB  (WNL=70-72 dB) with sound level meter 30cm away from pt's mouth. Pt's conversational volume has decreased since last treatment course. "Everybody asks me to speak up" pt stated.  Pt does report difficulty with swallowing, which does warrant further evaluation; occasional coughing with water, and intermittent difficulty with pills.   Pt would benefit from speech-language eval for dysarthria, and a bedside swallow eval  - please order via EPIC if agreed.    Jule Whitsel, CCC-SLP 03/06/2023, 11:58 AM  [place] [facility] [address] [place], [place], [address] Phone: [phone]   Fax:  [phone]

## 2023-03-14 DIAGNOSIS — I739 Peripheral vascular disease, unspecified: Secondary | ICD-10-CM | POA: Diagnosis not present

## 2023-03-14 DIAGNOSIS — M792 Neuralgia and neuritis, unspecified: Secondary | ICD-10-CM | POA: Diagnosis not present

## 2023-03-14 DIAGNOSIS — R600 Localized edema: Secondary | ICD-10-CM | POA: Diagnosis not present

## 2023-03-14 DIAGNOSIS — B351 Tinea unguium: Secondary | ICD-10-CM | POA: Diagnosis not present

## 2023-03-21 ENCOUNTER — Ambulatory Visit: Payer: PPO

## 2023-03-21 ENCOUNTER — Other Ambulatory Visit: Payer: Self-pay

## 2023-03-21 ENCOUNTER — Ambulatory Visit: Payer: PPO | Admitting: Occupational Therapy

## 2023-03-21 DIAGNOSIS — R293 Abnormal posture: Secondary | ICD-10-CM

## 2023-03-21 DIAGNOSIS — R41841 Cognitive communication deficit: Secondary | ICD-10-CM

## 2023-03-21 DIAGNOSIS — R1310 Dysphagia, unspecified: Secondary | ICD-10-CM | POA: Diagnosis not present

## 2023-03-21 DIAGNOSIS — R2689 Other abnormalities of gait and mobility: Secondary | ICD-10-CM | POA: Diagnosis not present

## 2023-03-21 DIAGNOSIS — R2681 Unsteadiness on feet: Secondary | ICD-10-CM

## 2023-03-21 DIAGNOSIS — G20A2 Parkinson's disease without dyskinesia, with fluctuations: Secondary | ICD-10-CM | POA: Diagnosis not present

## 2023-03-21 DIAGNOSIS — M6281 Muscle weakness (generalized): Secondary | ICD-10-CM

## 2023-03-21 DIAGNOSIS — R278 Other lack of coordination: Secondary | ICD-10-CM | POA: Diagnosis not present

## 2023-03-21 DIAGNOSIS — R29818 Other symptoms and signs involving the nervous system: Secondary | ICD-10-CM | POA: Diagnosis not present

## 2023-03-21 DIAGNOSIS — R471 Dysarthria and anarthria: Secondary | ICD-10-CM | POA: Diagnosis not present

## 2023-03-21 DIAGNOSIS — R4701 Aphasia: Secondary | ICD-10-CM

## 2023-03-21 NOTE — Patient Instructions (Signed)
    SEVEN LOUD "AHHHHH" TWICE A DAY! SENTENCES TWICE A DAY!!  WHEN YOU DRINK, TUCK YOUR CHIN!!

## 2023-03-21 NOTE — Therapy (Signed)
OUTPATIENT SPEECH LANGUAGE PATHOLOGY PARKINSON'S EVALUATION   Patient Name: Matthew [name] MRN: [id number] DOB:04/14/1951, 72 y.o., male Today's Date: 03/21/2023  PCP: Gildardo Cranker, MD REFERRING PROVIDER: Kerin Salen, DO  END OF SESSION:  End of Session - 03/21/23 2142     Visit Number 1    Number of Visits 17    Date for SLP Re-Evaluation 05/25/23    SLP Start Time 1404    SLP Stop Time  1445    SLP Time Calculation (min) 41 min    Activity Tolerance Patient tolerated treatment well             Past Medical History:  Diagnosis Date   Anxiety    Arthritis    GERD (gastroesophageal reflux disease)    occ remote history   Hypertension    Parkinson disease (HCC)    Prostate cancer (HCC)    Right knee pain    questionable meniscal tear   Seasonal allergies    Past Surgical History:  Procedure Laterality Date   COLONOSCOPY     Leg Laceration Repair  Left    Age 89   PELVIC LYMPH NODE DISSECTION Bilateral 03/22/2018   Procedure: PELVIC LYMPH NODE DISSECTION;  Surgeon: Rene Paci, MD;  Location: [facility];  Service: Urology;  Laterality: Bilateral;   PROSTATE BIOPSY     ROBOT ASSISTED LAPAROSCOPIC RADICAL PROSTATECTOMY N/A 03/22/2018   Procedure: XI ROBOTIC ASSISTED LAPAROSCOPIC PROSTATECTOMY;  Surgeon: Rene Paci, MD;  Location: [facility];  Service: Urology;  Laterality: N/A;   TOTAL KNEE ARTHROPLASTY Right 05/05/2019   Procedure: RIGHT TOTAL KNEE ARTHROPLASTY;  Surgeon: Gean Birchwood, MD;  Location: [facility];  Service: Orthopedics;  Laterality: Right;   Patient Active Problem List   Diagnosis Date Noted   S/P TKR (total knee replacement), right 05/05/2019   Osteoarthritis of right knee 05/02/2019   Prostate cancer (HCC) 03/22/2018   Malignant neoplasm of prostate (HCC) 02/27/2018    ONSET DATE: script dated 03-06-23  REFERRING DIAG: Parkinson's Disease  THERAPY DIAG:  Dysarthria and anarthria  Dysphagia, unspecified  type  Cognitive communication deficit  Aphasia  Rationale for Evaluation and Treatment: Rehabilitation  SUBJECTIVE:   SUBJECTIVE STATEMENT: "My talking is bad, [name]. [name] can't hear me." Pt accompanied by: self  PERTINENT HISTORY: Pt well-known to this SLP from previous courses of ST. MBS in August 2023- results below. Pt's last ST course resulted in pt partially meeting goal for producing speech in upper 60s dB in simple to mod complex with occasional min A.  PAIN:  Are you having pain? No  FALLS: Has patient fallen in last 6 months?  See PT evaluation for details  LIVING ENVIRONMENT: Lives with: lives with their family Lives in: House/apartment  PLOF:  Level of assistance: Needed assistance with ADLs, Needed assistance with IADLS "[name] cuts my food for me these days" Employment: Retired  PATIENT GOALS: Improve loudness and fluency  OBJECTIVE:   DIAGNOSTIC FINDINGS:  MBS 12-22-21 Patient presents with functional oropharyngeal swallowing without aspiration of any consistency tested. He does demonstrate minimal asymptomatic laryngeal penetration of thin liquid due to decreased timing of laryngeal closure. Chin tuck posture with use of straw effective to prevent penetration. Pharyngeal swallow is strong without retention. Oral transiting discoordination noted with pt swallowing barium tablet as he required 2nd bolus of thin to transit through oropharynx. Upon esophageal sweep, pt appeared with barium tablet retained in esophagus with pt awareness. Tablet appeared to transit distally after several boluses of liquids.  After tablet had transited, pt continued with "phantom" sensation to esophagus. Recommend pt continue diet as tolerated, using chin tuck with liquids if improves comfort, decreases cough. Also encouraged pt to assure maintains strength of voice, cough and expectoration for airway protection.   COGNITION: Overall cognitive status: Impaired Areas of impairment:  Following commands and Problem solving, and word finding (slower processing)   MOTOR SPEECH: Overall motor speech: impaired Level of impairment: Word Respiration: thoracic breathing and clavicular breathing Phonation: breathy and low vocal intensity (see below for measurements) Resonance: WFL Articulation: Appears intact Intelligibility: Intelligible Effective technique: increased vocal intensity (See below)  ORAL MOTOR EXAMINATION: Overall status: Impaired:   Labial: Bilateral (ROM and Coordination) Lingual: Bilateral (ROM and Coordination) Facial: Bilateral (ROM) Comments: better ROM with visual cues; pt smiled once during evaluation  OBJECTIVE VOICE ASSESSMENT: Sustained loud "ah" maximum phonation time: 13.5 seconds Sustained loud "ah" loudness average: 84 dB Oral reading (passage) loudness average: 68 dB Oral reading loudness range: 61-72 dB Conversational loudness average: 65 dB Conversational loudness range: 58-71 dB Voice quality: breathy and low vocal intensity Stimulability trials: Given SLP modeling and rare min cues, loudness average increased to average 69dB (range of 62 to 72); However after 3 minutes of simple conversation. loudness decreased to average 65 dB. Cognitive load moderately negatively impacted pt's ability to achieve WNL loudness in conversation.  Pt does report difficulty with swallowing which SLP assessed today SUBJECTIVE DYSPHAGIA REPORTS:  Date of onset: prior to 2023 Reported symptoms: coughing with liquids  Current diet: regular, thin liquids, and current diet modifications: avoids foods which are difficult to keep on fork/spoon  Co-morbid voice changes: No  FACTORS WHICH MAY INCREASE RISK OF ADVERSE EVENT IN PRESENCE OF ASPIRATION:  General health: average  Risk factors: reduced cognitive function    ORAL MOTOR EXAMINATION: as above  CLINICAL SWALLOW ASSESSMENT:   Dentition: adequate natural dentition Vocal quality at baseline: low vocal  intensity Patient directly observed with POs: Yes: thin liquids  Feeding: needs set up Liquids provided by: straw Yale Swallow Protocol: not assessed Oral phase signs and symptoms: pt reports anterior leakage rarely with liquids; Anterior leakage during the day  Pharyngeal phase signs and symptoms: immediate throat clear; When pt used chin tuck posture 2/2, he said, "That felt good" and "It went down well."  PATIENT REPORTED OUTCOME MEASURES (PROM): Communication Effectiveness Survey: to be completed in first 2 sessions  TODAY'S TREATMENT:                                                                                                                                         DATE:  1030/24 (eval): SPEECH: SLP explained eval results to pt. SLP ensured pt knew the importance of daily loud /a/ and everyday sentences practice BID.  SWALLOWING: Additionally, he and SLP practiced chin down posture with a straw and with a cup.  PATIENT EDUCATION: Education details:  see "today's treatment" Person educated: Patient Education method: Explanation and Demonstration Education comprehension: verbalized understanding  HOME EXERCISE PROGRAM: Loud /a/ and everyday sentences.    GOALS: Goals reviewed with patient? No  SHORT TERM GOALS: Target date: 04/20/23  Produce /a/ with average upper 80s dB over three sessions Baseline: Goal status: INITIAL  2.  pt will demo abdominal breathing with sentence responses 85% accuracy x 3 sessions  Baseline:  Goal status: INITIAL  3.  pt will generate 5 minutes simple conversation with low 70s dB average in 3 sessions  Baseline:  Goal status: INITIAL  4.  Pt will use strategies for dysfluency in 1-2 minute short conversational segments  Baseline:  Goal status: INITIAL   LONG TERM GOALS: Target date: 05/20/23  pt will produce average upper 60s dB in 10 minutes simple to mod complex conversation in 5 sessions  Baseline:  Goal status: INITIAL  2.  pt  will maintain upper 60s dB average in 10 minute simple conversation x 3 sessions  Baseline:  Goal status: INITIAL  3.  pt will use abdominal breathing >80% of the time during 10 minute simple conversation in 3 sessions  Baseline:  Goal status: INITIAL  4.  pt will report fewer requests to repeat himself than prior to ST in 3 sessions  Baseline:  Goal status: INITIAL  5.  Pt will use strategies for safer swallowing in 3 sessions with modified independence  Baseline:  Goal status: INITIAL  6.  Pt will score higher/better on PROM in the last 2 weeks of therapy Baseline:  Goal status: INITIAL  ASSESSMENT:  CLINICAL IMPRESSION: Patient is a 72 y.o. male who was seen today for assessment of dysarthria and swallowing in light of Parkinsons Disease. Pt tells SLP that family members ask him to repeat frequently, and he frequency of communicating in social situations has decr'd. Pt was able to improve loudness in simple conversation for 3 minutes and thus is an excellent candidate for ST.  Additionally when pt used chin tuck posture with liquids as told on MBS in August 2023, he felt safer.  OBJECTIVE IMPAIRMENTS: Objective impairments include executive functioning, aphasia, dysarthria, and dysphagia. These impairments are limiting patient from household responsibilities, ADLs/IADLs, effectively communicating at home and in community, and safety when swallowing.Factors affecting potential to achieve goals and functional outcome are  none .Marland Kitchen Patient will benefit from skilled SLP services to address above impairments and improve overall function.  REHAB POTENTIAL: Good  PLAN:  SLP FREQUENCY: 2x/week  SLP DURATION: 8 weeks  PLANNED INTERVENTIONS: Aspiration precaution training, Diet toleration management , Environmental controls, Cueing hierachy, Cognitive reorganization, Internal/external aids, Oral motor exercises, Functional tasks, Multimodal communication approach, SLP instruction and  feedback, Compensatory strategies, and Patient/family education    [facility], CCC-SLP 03/21/2023, 9:43 PM

## 2023-03-21 NOTE — Therapy (Signed)
OUTPATIENT PHYSICAL THERAPY NEURO EVALUATION   Patient Name: Matthew [name] MRN: [id number] DOB:[birth year], 72 y.o., male Today's Date: 03/21/2023   PCP: Daisy Floro, MD REFERRING PROVIDER: Tat, Octaviano Batty, DO  END OF SESSION:  PT End of Session - 03/21/23 1451     Visit Number 1    Number of Visits 17    Date for PT Re-Evaluation 05/16/23    Authorization Type [employer]    Progress Note Due on Visit 10    PT Start Time 1445    PT Stop Time 1530    PT Time Calculation (min) 45 min             Past Medical History:  Diagnosis Date   Anxiety    Arthritis    GERD (gastroesophageal reflux disease)    occ remote history   Hypertension    Parkinson disease (HCC)    Prostate cancer (HCC)    Right knee pain    questionable meniscal tear   Seasonal allergies    Past Surgical History:  Procedure Laterality Date   COLONOSCOPY     Leg Laceration Repair  Left    Age 89   PELVIC LYMPH NODE DISSECTION Bilateral 03/22/2018   Procedure: PELVIC LYMPH NODE DISSECTION;  Surgeon: Rene Paci, MD;  Location: [facility];  Service: Urology;  Laterality: Bilateral;   PROSTATE BIOPSY     ROBOT ASSISTED LAPAROSCOPIC RADICAL PROSTATECTOMY N/A 03/22/2018   Procedure: XI ROBOTIC ASSISTED LAPAROSCOPIC PROSTATECTOMY;  Surgeon: Rene Paci, MD;  Location: [facility];  Service: Urology;  Laterality: N/A;   TOTAL KNEE ARTHROPLASTY Right 05/05/2019   Procedure: RIGHT TOTAL KNEE ARTHROPLASTY;  Surgeon: Gean Birchwood, MD;  Location: [facility];  Service: Orthopedics;  Laterality: Right;   Patient Active Problem List   Diagnosis Date Noted   S/P TKR (total knee replacement), right 05/05/2019   Osteoarthritis of right knee 05/02/2019   Prostate cancer (HCC) 03/22/2018   Malignant neoplasm of prostate (HCC) 02/27/2018    ONSET DATE: PD 2021  REFERRING DIAG: G20.A2 (ICD-10-CM) - Parkinson's disease without dyskinesia, with fluctuating manifestations (HCC)    THERAPY DIAG:  Parkinson's disease without dyskinesia, with fluctuating manifestations (HCC)  Other abnormalities of gait and mobility  Other symptoms and signs involving the nervous system  Muscle weakness (generalized)  Abnormal posture  Unsteadiness on feet  Rationale for Evaluation and Treatment: Rehabilitation  SUBJECTIVE:  SUBJECTIVE STATEMENT: Doing ok. Need help getting dressed sometimes.  Noting increased unsteadiness and frequency of falls. Using 4WW inside the home. Increased difficulty with walking backwards. Reports continued practice of PWR moves in standing at home for HEP.  Increasing back pain and postural stress reported Pt accompanied by: self  PERTINENT HISTORY: PD, R TKR, prostate CA, HTN  PAIN:  Are you having pain? Yes: NPRS scale: 0/10 Pain location: upper back/lower back "it moves" Bilateral shoulders intermittently painful Pain description: hard to describe Aggravating factors: certain movements/times Relieving factors: rest  PRECAUTIONS: Fall  RED FLAGS: None   WEIGHT BEARING RESTRICTIONS: No  FALLS: Has patient fallen in last 6 months? Yes. Number of falls "several"  LIVING ENVIRONMENT: Lives with: lives with their family Lives in: House/apartment Stairs: Yes, 2-3 steps to enter Has following equipment at home: Dan Humphreys - 4 wheeled and hospital bed  PLOF: Needs assistance with ADLs, Needs assistance with transfers, and bed mobility  PATIENT GOALS: improve balance, mobility, strength. "Get as normal as I can". Be able to step up into son's Jeep  OBJECTIVE:  Note: Objective measures were completed at Evaluation unless otherwise noted.  DIAGNOSTIC FINDINGS: n/a for this episode  COGNITION: Overall cognitive status:  notes increased issues with  memory   SENSATION: Not tested, denies parasthesias  COORDINATION: Deficits with rapid alternating movements Finger to nose intact    MUSCLE TONE: BLE rigidity  MUSCLE LENGTH: Hamstrings: Right -30 deg; Left -30 deg   DTRs:  NT  POSTURE: rounded shoulders, forward head, increased thoracic kyphosis, and flexed trunk   LOWER EXTREMITY ROM:     Active  Right Eval Left Eval  Hip flexion    Hip extension    Hip abduction    Hip adduction    Hip internal rotation    Hip external rotation    Knee flexion    Knee extension -30 -30  Ankle dorsiflexion 10 10  Ankle plantarflexion    Ankle inversion    Ankle eversion     (Blank rows = not tested)  LOWER EXTREMITY MMT:    BLE 4/5 gross strength  BED MOBILITY:  Moderate assist  TRANSFERS: Assistive device utilized: Environmental consultant - 4 wheeled  Sit to stand: Complete Independence and Modified independence Stand to sit: Complete Independence Chair to chair: Complete Independence and Modified independence Floor:  NT    CURB:  Level of Assistance: CGA Assistive device utilized: Environmental consultant - 4 wheeled Curb Comments:   STAIRS: Reports stairs to 2nd floor but does not use 2-3 stairs to enter home  GAIT: Gait pattern: knee flexed in stance- Right and knee flexed in stance- Left Distance walked:  Assistive device utilized: Environmental consultant - 4 wheeled Level of assistance: Modified independence and SBA Comments:   FUNCTIONAL TESTS:  Mini-BESTest  11/28  Timed Up and Go test:26 sec with 4WW (compared to 15 sec)   10 meter walk test:15.31 sec = 2.14 ft/sec  (compared to 2.01 ft/sec)   5 time sit to stand test: 23 sec-(compared to 11 sec)    PATIENT EDUCATION: Education details: assessment details, rationale of intervention Person educated: Patient Education method: Explanation Education comprehension: verbalized understanding  HOME EXERCISE PROGRAM: TBD-reports daily performance 20 reps standing PWR! moves  GOALS: Goals  reviewed with patient? Yes  SHORT TERM GOALS: Target date: 04/18/2023    Patient will be independent in HEP to improve functional outcomes Baseline: Goal status: INITIAL  2.  Demo improved BLE strength and balance per time 15 sec  5xSTS test Baseline: 23 Goal status: INITIAL  3.  Demo improved safety with ambulation per time 15 sec TUG test Baseline:  Goal status: INITIAL    LONG TERM GOALS: Target date: 05/16/2023    Improve hamstring flexibility to -15 degrees to reduce rigidity and enable greater knee extension ROM for mobility Baseline: -30 Goal status: INITIAL  2.  Reduce risk for falls per score 20/28 Mini-BESTest Baseline: 11/20 Goal status: INITIAL  3.  Demo modified independent bed mobility to reduce level of assist from caregivers  Baseline: moderate assist  Goal status: INITIAL  ASSESSMENT:  CLINICAL IMPRESSION: Patient is a 72 y.o. male who was seen today for physical therapy evaluation and treatment for mobility deficits and impairments related to Parkinson's Disease.  Exhibits increased rigidity, freezing of gait, decreased strength, balance and postural control deficits with limited flexibility.  Demonstrates decline in functional mobility requiring increased level of assistance from spouse for mobility and ADL.  High risk for falls evident by outcome measures.  Pt would benefit from PT Services to increase functional independence and reduce risk for falls  OBJECTIVE IMPAIRMENTS: Abnormal gait, decreased activity tolerance, decreased balance, decreased coordination, decreased mobility, difficulty walking, decreased ROM, decreased strength, impaired flexibility, impaired tone, improper body mechanics, postural dysfunction, and pain.   ACTIVITY LIMITATIONS: carrying, lifting, bending, standing, squatting, stairs, transfers, bed mobility, reach over head, and locomotion level  PARTICIPATION LIMITATIONS: meal prep, cleaning, laundry, interpersonal relationship,  shopping, community activity, yard work, and hobbies of music  PERSONAL FACTORS: Age, Past/current experiences, Time since onset of injury/illness/exacerbation, and 1-2 comorbidities: PMH  are also affecting patient's functional outcome.   REHAB POTENTIAL: Good  CLINICAL DECISION MAKING: Evolving/moderate complexity  EVALUATION COMPLEXITY: Moderate  PLAN:  PT FREQUENCY: 2x/week  PT DURATION: 8 weeks  PLANNED INTERVENTIONS: 97110-Therapeutic exercises, 97530- Therapeutic activity, [id number]- Neuromuscular re-education, 97535- Self Care, [id number]- Manual therapy, [phone]- Gait training, [phone]- Orthotic Fit/training, [phone]- Canalith repositioning, [phone]- Aquatic Therapy, Patient/Family education, Balance training, Stair training, Taping, Dry Needling, Joint mobilization, Spinal mobilization, Vestibular training, DME instructions, Cryotherapy, and Moist heat  PLAN FOR NEXT SESSION: review standing PWR moves HEP, supine power moves and stretching   5:19 PM, 03/21/23 M. Shary Decamp, PT, DPT Physical Therapist- [place] Office Number: [phone]

## 2023-03-21 NOTE — Therapy (Signed)
OUTPATIENT OCCUPATIONAL THERAPY PARKINSON'S EVALUATION  Patient Name: Matthew [name] MRN: [id number] DOB:05/16/1951, 72 y.o., male Today's Date: 03/21/2023  PCP: Daisy Floro, MD REFERRING PROVIDER: Tat, Octaviano Batty, DO  END OF SESSION:  OT End of Session - 03/21/23 1540     Visit Number 1    Number of Visits 17    Date for OT Re-Evaluation 05/16/23    Authorization Type [employer]    OT Start Time 1535    OT Stop Time 1615    OT Time Calculation (min) 40 min             Past Medical History:  Diagnosis Date   Anxiety    Arthritis    GERD (gastroesophageal reflux disease)    occ remote history   Hypertension    Parkinson disease (HCC)    Prostate cancer (HCC)    Right knee pain    questionable meniscal tear   Seasonal allergies    Past Surgical History:  Procedure Laterality Date   COLONOSCOPY     Leg Laceration Repair  Left    Age 32   PELVIC LYMPH NODE DISSECTION Bilateral 03/22/2018   Procedure: PELVIC LYMPH NODE DISSECTION;  Surgeon: Rene Paci, MD;  Location: [facility];  Service: Urology;  Laterality: Bilateral;   PROSTATE BIOPSY     ROBOT ASSISTED LAPAROSCOPIC RADICAL PROSTATECTOMY N/A 03/22/2018   Procedure: XI ROBOTIC ASSISTED LAPAROSCOPIC PROSTATECTOMY;  Surgeon: Rene Paci, MD;  Location: [facility];  Service: Urology;  Laterality: N/A;   TOTAL KNEE ARTHROPLASTY Right 05/05/2019   Procedure: RIGHT TOTAL KNEE ARTHROPLASTY;  Surgeon: Gean Birchwood, MD;  Location: [facility];  Service: Orthopedics;  Laterality: Right;   Patient Active Problem List   Diagnosis Date Noted   S/P TKR (total knee replacement), right 05/05/2019   Osteoarthritis of right knee 05/02/2019   Prostate cancer (HCC) 03/22/2018   Malignant neoplasm of prostate (HCC) 02/27/2018    ONSET DATE: referral date 03/06/23  REFERRING DIAG: G20.A2 (ICD-10-CM) - Parkinson's disease without dyskinesia, with fluctuating manifestations  THERAPY DIAG:  Other  symptoms and signs involving the nervous system  Muscle weakness (generalized)  Abnormal posture  Unsteadiness on feet  Other lack of coordination  Rationale for Evaluation and Treatment: Rehabilitation  SUBJECTIVE:   SUBJECTIVE STATEMENT: Pt reports "things are not going well".  "My wife is my second pair of hands".  Pt's wife cuts up food for him.  He reports having difficulty keeping food on his fork.  Wife will assist him with dressing if he is to get dressed in a "reasonable amount of time."  "It may take 30 mins to put on my underwear."  Pt reports increased difficulty with getting in/out of bed, is now sleeping in hospital bed (for last 4-5 months). Pt accompanied by: self and significant other  PERTINENT HISTORY: PD, anxiety, arthritis, GERD, HTN, R TKR   PRECAUTIONS: Fall  WEIGHT BEARING RESTRICTIONS: No  PAIN:  Are you having pain? No  FALLS: Has patient fallen in last 6 months? Yes. Number of falls Pt reports "having a time where he fell frequently" but has fallen ~3 times in the last "few months" with most recent being Saturday.  LIVING ENVIRONMENT: Lives with: lives with their spouse Lives in: House/apartment Stairs:  Yes: Internal: full flight of step, but able to stay on main floor without need to navigate steps; and External: 2 steps; on right going up Has following equipment at home: Single point cane, Quad cane small  base, Walker - 2 wheeled, Environmental consultant - 4 wheeled, Wheelchair (manual), shower chair, Grab bars, and transport chair - for dr appts  PLOF: Requires assistive device for independence and Needs assistance with ADLs  PATIENT GOALS: to be as close to normal as possible  OBJECTIVE:  Note: Objective measures were completed at Evaluation unless otherwise noted.  HAND DOMINANCE: Right  ADLs: Overall ADLs: reports overall slowing, wife is "second pair of hands" Transfers/ambulation related to ADLs: utilizing small 4 wheeled walker for all mobility   Eating: wife assisting with cutting foods, pt spilling foods off utensil, constantly spilling things off the plate, having to use finger to stabilize food when scooping with R hand Grooming: utilizing electric toothbrush and water pick for oral care, difficulty with brushing hair  UB Dressing: primarily wearing t-shirts/pull over shirts, occasional assist from wife if in a hurry or get stuck, no longer wearing button up shirts anymore.  Wife will assist with donning shirt if completing after shower LB Dressing: difficulty with reaching forward towards feet when donning pants, socks, shoes; increased difficulty when crossing leg , no longer wearing lace up shoes Toileting: requires momentum to stand up from toilet or will reach towards door for leverage/momentum, reports "struggle" with hygiene  Bathing: occasional assist for thoroughness when drying off  Tub Shower transfers: wife provides supervision, handle is just inside door and pt able to hold on to it while stepping over small ledge Equipment: Grab bars, Walk in shower, and Long handled sponge  IADLs: Spouse completes all shopping, meal prep, and housekeeping tasks. Community mobility: Relies on family or friends for transportation Medication management: pt fills pill box and is able to take correct dosages at correct time Financial management: wife completes and did prior Handwriting:  TBA  MOBILITY STATUS: Hx of falls and difficulty carrying objections with ambulation  POSTURE COMMENTS:  forward head  ACTIVITY TOLERANCE: Activity tolerance: diminished  FUNCTIONAL OUTCOME MEASURES: Physical performance test: PPT#2 (simulated eating) 25.91 & PPT#4 (donning/doffing jacket): TBA  COORDINATION: 9 Hole Peg test: Right: 1:36.03 sec; Left: 58.28 sec  UE ROM:   B shoulder flexion ~120 *, reports mild discomfort in R upper arm with internal rotation but able to achieve full ROM  UE MMT:    grossly 4/5 bilaterally  MUSCLE TONE: RUE:  Mild and LUE: Mild  COGNITION: Overall cognitive status:  slower processing and speech  OBSERVATIONS: Bradykinesia   TODAY'S TREATMENT:                                                                                                                              DATE:  03/21/23 NA, eval only    PATIENT EDUCATION: Education details: Educated on role and purpose of OT as well as potential interventions and goals for therapy based on initial evaluation findings. Person educated: Patient Education method: Explanation Education comprehension: needs further education  HOME EXERCISE PROGRAM: TBD  GOALS: Goals reviewed with patient? No  SHORT  TERM GOALS: Target date: 04/18/23  Pt will be Independent with PD specific HEP. Baseline: Goal status: INITIAL  2.  Pt will demonstrate improved shoulder flexion bilaterally to retreive a lightweight object from overhead shelf with BUE at >/= 130 shoulder flexion.  Baseline:  Goal status: INITIAL  3.  Pt will verbalize understanding of adapted strategies and DME/AE PRN (reacher, BSC, shower chair, rocker knife, scoop plate/plate guard, etc) to maximize safety and independence with ADLs/IADLs.  Baseline:  Goal status: INITIAL  4.  Pt will report understanding of modifications and adaptive strategies to improve handwriting legibility and ease. Baseline:  Goal status: INITIAL   LONG TERM GOALS: Target date: 05/16/23  Pt will demonstrate increased functional ROM and use of BUE to aid in clothing management and hygiene with toileting tasks.  Baseline:  Goal status: INITIAL  2.  Pt will demonstrate improved ability to get up from lower seat/toilet without assistance with DME PRN. Baseline:  Goal status: INITIAL  3.  Pt will demonstrate improved fine motor coordination for ADLs as evidenced by decreasing 9 hole peg test score for RUE by 10 secs on R and 1 or fewer drops. Baseline: R: 1:36 and L: 58 sec.  Pt reports dropping small items  frequently Goal status: INITIAL  4.  Pt will demonstrate improved ease with feeding as evidenced by decreasing PPT#2 (self feeding) by 5 secs  Baseline: 25.91 Goal status: INITIAL  5.  Pt will demonstrate and/or report improved independence with LB dressing with use of AE/DME as needed.  Baseline:  Goal status: INITIAL  ASSESSMENT:  CLINICAL IMPRESSION: Patient is a 72 y.o. male who was seen today for occupational therapy evaluation for progression of PD and slowing with ADLs and coordination tasks. Pt reports that his wife is helping him more frequently with aspects of getting dressed as well as cutting up his food.  Pt reports dropping small items more frequently.  Pt will benefit from skilled occupational therapy services to address strength and coordination, ROM, pain management, balance, GM/FM control, safety awareness, introduction of compensatory strategies/AE prn, and implementation of an HEP to improve participation and safety during ADLs, IADLs, and improved quality of life.    PERFORMANCE DEFICITS: in functional skills including ADLs, IADLs, coordination, ROM, strength, flexibility, Fine motor control, Gross motor control, balance, body mechanics, endurance, decreased knowledge of precautions, decreased knowledge of use of DME, and UE functional use and psychosocial skills including coping strategies, environmental adaptation, and routines and behaviors.   IMPAIRMENTS: are limiting patient from ADLs, IADLs, and rest and sleep.   COMORBIDITIES:  may have co-morbidities  that affects occupational performance. Patient will benefit from skilled OT to address above impairments and improve overall function.  MODIFICATION OR ASSISTANCE TO COMPLETE EVALUATION: Min-Moderate modification of tasks or assist with assess necessary to complete an evaluation.  OT OCCUPATIONAL PROFILE AND HISTORY: Detailed assessment: Review of records and additional review of physical, cognitive, psychosocial  history related to current functional performance.  CLINICAL DECISION MAKING: Moderate - several treatment options, min-mod task modification necessary  REHAB POTENTIAL: Good  EVALUATION COMPLEXITY: Moderate    PLAN:  OT FREQUENCY: 2x/week  OT DURATION: 8 weeks  PLANNED INTERVENTIONS: 97168 OT Re-evaluation, 97535 self care/ADL training, 16109 therapeutic exercise, 97530 therapeutic activity, 97112 neuromuscular re-education, 97035 ultrasound, 97010 moist heat, 97010 cryotherapy, functional mobility training, psychosocial skills training, energy conservation, coping strategies training, patient/family education, and DME and/or AE instructions  RECOMMENDED OTHER SERVICES: NA  CONSULTED AND AGREED WITH PLAN OF CARE:  Patient  PLAN FOR NEXT SESSION: Initiate large amplitude exercises, initiate bag exercises with focus on improved shoulder ROM   Holland Nickson, OTR/L 03/21/2023, 4:37 PM   [facility] at [facility] [address], [address] [place], [place] [address] Phone # [phone] Fax # [phone]

## 2023-03-26 NOTE — Therapy (Signed)
OUTPATIENT PHYSICAL THERAPY NEURO TREATMENT   Patient Name: Matthew [name] MRN: [id number] DOB:07-Dec-1950, 72 y.o., male Today's Date: 03/27/2023   PCP: Daisy Floro, MD REFERRING PROVIDER: Tat, Octaviano Batty, DO  END OF SESSION:  PT End of Session - 03/27/23 1615     Visit Number 2    Number of Visits 17    Date for PT Re-Evaluation 05/16/23    Authorization Type [employer]    Progress Note Due on Visit 10    PT Start Time 1537   pt late   PT Stop Time 1616    PT Time Calculation (min) 39 min    Equipment Utilized During Treatment Gait belt    Activity Tolerance Patient tolerated treatment well    Behavior During Therapy WFL for tasks assessed/performed              Past Medical History:  Diagnosis Date   Anxiety    Arthritis    GERD (gastroesophageal reflux disease)    occ remote history   Hypertension    Parkinson disease (HCC)    Prostate cancer (HCC)    Right knee pain    questionable meniscal tear   Seasonal allergies    Past Surgical History:  Procedure Laterality Date   COLONOSCOPY     Leg Laceration Repair  Left    Age 40   PELVIC LYMPH NODE DISSECTION Bilateral 03/22/2018   Procedure: PELVIC LYMPH NODE DISSECTION;  Surgeon: Rene Paci, MD;  Location: [facility];  Service: Urology;  Laterality: Bilateral;   PROSTATE BIOPSY     ROBOT ASSISTED LAPAROSCOPIC RADICAL PROSTATECTOMY N/A 03/22/2018   Procedure: XI ROBOTIC ASSISTED LAPAROSCOPIC PROSTATECTOMY;  Surgeon: Rene Paci, MD;  Location: [facility];  Service: Urology;  Laterality: N/A;   TOTAL KNEE ARTHROPLASTY Right 05/05/2019   Procedure: RIGHT TOTAL KNEE ARTHROPLASTY;  Surgeon: Gean Birchwood, MD;  Location: [facility];  Service: Orthopedics;  Laterality: Right;   Patient Active Problem List   Diagnosis Date Noted   S/P TKR (total knee replacement), right 05/05/2019   Osteoarthritis of right knee 05/02/2019   Prostate cancer (HCC) 03/22/2018   Malignant neoplasm of  prostate (HCC) 02/27/2018    ONSET DATE: PD 2021  REFERRING DIAG: G20.A2 (ICD-10-CM) - Parkinson's disease without dyskinesia, with fluctuating manifestations (HCC)   THERAPY DIAG:  Parkinson's disease without dyskinesia, with fluctuating manifestations (HCC)  Other abnormalities of gait and mobility  Other symptoms and signs involving the nervous system  Muscle weakness (generalized)  Abnormal posture  Unsteadiness on feet  Other lack of coordination  Other symptoms and signs involving the musculoskeletal system  Rationale for Evaluation and Treatment: Rehabilitation  SUBJECTIVE:  SUBJECTIVE STATEMENT: Larey Seat last night and landing on my butt when trying to sit in a rolling chair, when he missed the seat. Using this rolling chair instead of standard dining room chair d/t increasing trouble when getting in/out of dining room chair. Denies injury. Pt accompanied by: self  PERTINENT HISTORY: PD, R TKR, prostate CA, HTN  PAIN:  Are you having pain? Yes: NPRS scale: 0/10 Pain location: upper back/lower back "it moves" Bilateral shoulders intermittently painful Pain description: hard to describe Aggravating factors: certain movements/times Relieving factors: rest  PRECAUTIONS: Fall  RED FLAGS: None   WEIGHT BEARING RESTRICTIONS: No  FALLS: Has patient fallen in last 6 months? Yes. Number of falls "several"  LIVING ENVIRONMENT: Lives with: lives with their family Lives in: House/apartment Stairs: Yes, 2-3 steps to enter Has following equipment at home: Dan Humphreys - 4 wheeled and hospital bed  PLOF: Needs assistance with ADLs, Needs assistance with transfers, and bed mobility  PATIENT GOALS: improve balance, mobility, strength. "Get as normal as I can". Be able to step up into son's  Jeep  OBJECTIVE:       TODAY'S TREATMENT: 03/27/23 Activity Comments  Sit<>sidelying>supine 3x  Sit<>supine 4x Requires manual and verbal cues for positioning, reaching across body for log roll, pushing off elbow to sit up, stepping feet and hips to reposition in supine, min A for LE management  Sitting lateral scooting Cues for larger LE movements for more efficient scoots   supine PWR moves Up 10x [name] 10x twist  10x Step 10x 2 pillows under head. Cues for large palms, encouraging scapular retraction with verbal cues, increasing power and amplitude behind movement; cueing to increase pace   Supine>sit At end of session pt appeared much more comfortable laying supine d/t stretching and activity. Good ability to perform bed mobility with less assist at end of session.                 HOME EXERCISE PROGRAM Last updated: 03/27/23 Supine PWR! Moves 10x each daily   PATIENT EDUCATION: Education details: edu on laying supine without elevating HOB/LEs in hospital bed as much as possible to maintain max flexibility; HEP handout on supine PWR moves  Person educated: Patient Education method: Explanation, Demonstration, Tactile cues, Verbal cues, and Handouts Education comprehension: verbalized understanding and returned demonstration    Note: Objective measures were completed at Evaluation unless otherwise noted.  DIAGNOSTIC FINDINGS: n/a for this episode  COGNITION: Overall cognitive status:  notes increased issues with memory   SENSATION: Not tested, denies parasthesias  COORDINATION: Deficits with rapid alternating movements Finger to nose intact    MUSCLE TONE: BLE rigidity  MUSCLE LENGTH: Hamstrings: Right -30 deg; Left -30 deg   DTRs:  NT  POSTURE: rounded shoulders, forward head, increased thoracic kyphosis, and flexed trunk   LOWER EXTREMITY ROM:     Active  Right Eval Left Eval  Hip flexion    Hip extension    Hip abduction    Hip adduction     Hip internal rotation    Hip external rotation    Knee flexion    Knee extension -30 -30  Ankle dorsiflexion 10 10  Ankle plantarflexion    Ankle inversion    Ankle eversion     (Blank rows = not tested)  LOWER EXTREMITY MMT:    BLE 4/5 gross strength  BED MOBILITY:  Moderate assist  TRANSFERS: Assistive device utilized: Environmental consultant - 4 wheeled  Sit to stand: Complete Independence and Modified independence Stand  to sit: Complete Independence Chair to chair: Complete Independence and Modified independence Floor:  NT    CURB:  Level of Assistance: CGA Assistive device utilized: Environmental consultant - 4 wheeled Curb Comments:   STAIRS: Reports stairs to 2nd floor but does not use 2-3 stairs to enter home  GAIT: Gait pattern: knee flexed in stance- Right and knee flexed in stance- Left Distance walked:  Assistive device utilized: Environmental consultant - 4 wheeled Level of assistance: Modified independence and SBA Comments:   FUNCTIONAL TESTS:  Mini-BESTest  11/28  Timed Up and Go test:26 sec with 4WW (compared to 15 sec)   10 meter walk test:15.31 sec = 2.14 ft/sec  (compared to 2.01 ft/sec)   5 time sit to stand test: 23 sec-(compared to 11 sec)    PATIENT EDUCATION: Education details: assessment details, rationale of intervention Person educated: Patient Education method: Explanation Education comprehension: verbalized understanding  HOME EXERCISE PROGRAM: TBD-reports daily performance 20 reps standing PWR! moves  GOALS: Goals reviewed with patient? Yes  SHORT TERM GOALS: Target date: 04/18/2023    Patient will be independent in HEP to improve functional outcomes Baseline: Goal status: IN PROGRESS  2.  Demo improved BLE strength and balance per time 15 sec 5xSTS test Baseline: 23 Goal status: IN PROGRESS  3.  Demo improved safety with ambulation per time 15 sec TUG test Baseline:  Goal status: IN PROGRESS    LONG TERM GOALS: Target date: 05/16/2023    Improve  hamstring flexibility to -15 degrees to reduce rigidity and enable greater knee extension ROM for mobility Baseline: -30 Goal status: IN PROGRESS  2.  Reduce risk for falls per score 20/28 Mini-BESTest Baseline: 11/20 Goal status: IN PROGRESS  3.  Demo modified independent bed mobility to reduce level of assist from caregivers  Baseline: moderate assist  Goal status: IN PROGRESS  ASSESSMENT:  CLINICAL IMPRESSION: Patient arrived to session with report of a fall yesterday when getting into a rolling chair. Denies injury. Session focused on bed mobility; pt very flexed and rigid, making fluid movements and LE management difficult. Worked on different techniques and after prolonged period of supine with pillows, pt appeared visibly more comfortable and extended/flexible through trunk and able to perform supine>sidelying>sit transfer with less assistance after completing supine PWR moves. Reported understanding of HEP update and reported "this was really helpful."   OBJECTIVE IMPAIRMENTS: Abnormal gait, decreased activity tolerance, decreased balance, decreased coordination, decreased mobility, difficulty walking, decreased ROM, decreased strength, impaired flexibility, impaired tone, improper body mechanics, postural dysfunction, and pain.   ACTIVITY LIMITATIONS: carrying, lifting, bending, standing, squatting, stairs, transfers, bed mobility, reach over head, and locomotion level  PARTICIPATION LIMITATIONS: meal prep, cleaning, laundry, interpersonal relationship, shopping, community activity, yard work, and hobbies of music  PERSONAL FACTORS: Age, Past/current experiences, Time since onset of injury/illness/exacerbation, and 1-2 comorbidities: PMH  are also affecting patient's functional outcome.   REHAB POTENTIAL: Good  CLINICAL DECISION MAKING: Evolving/moderate complexity  EVALUATION COMPLEXITY: Moderate  PLAN:  PT FREQUENCY: 2x/week  PT DURATION: 8 weeks  PLANNED  INTERVENTIONS: 97110-Therapeutic exercises, 97530- Therapeutic activity, [id number]- Neuromuscular re-education, 97535- Self Care, 09811- Manual therapy, [phone]- Gait training, [phone]- Orthotic Fit/training, [phone]- Canalith repositioning, [phone]- Aquatic Therapy, Patient/Family education, Balance training, Stair training, Taping, Dry Needling, Joint mobilization, Spinal mobilization, Vestibular training, DME instructions, Cryotherapy, and Moist heat  PLAN FOR NEXT SESSION: review standing PWR moves HEP, supine power moves and stretching   Anette Guarneri, PT, DPT 03/27/23 4:21 PM  [facility] at  [facility] [address], [address] [place], [place] [address] Phone # [phone] Fax # [phone]

## 2023-03-27 ENCOUNTER — Ambulatory Visit: Payer: PPO | Attending: Neurology | Admitting: Physical Therapy

## 2023-03-27 ENCOUNTER — Ambulatory Visit: Payer: PPO

## 2023-03-27 ENCOUNTER — Encounter: Payer: Self-pay | Admitting: Physical Therapy

## 2023-03-27 DIAGNOSIS — R29818 Other symptoms and signs involving the nervous system: Secondary | ICD-10-CM | POA: Diagnosis not present

## 2023-03-27 DIAGNOSIS — R278 Other lack of coordination: Secondary | ICD-10-CM | POA: Insufficient documentation

## 2023-03-27 DIAGNOSIS — R293 Abnormal posture: Secondary | ICD-10-CM | POA: Insufficient documentation

## 2023-03-27 DIAGNOSIS — R1310 Dysphagia, unspecified: Secondary | ICD-10-CM | POA: Insufficient documentation

## 2023-03-27 DIAGNOSIS — R2681 Unsteadiness on feet: Secondary | ICD-10-CM | POA: Diagnosis not present

## 2023-03-27 DIAGNOSIS — R471 Dysarthria and anarthria: Secondary | ICD-10-CM

## 2023-03-27 DIAGNOSIS — R4701 Aphasia: Secondary | ICD-10-CM

## 2023-03-27 DIAGNOSIS — M6281 Muscle weakness (generalized): Secondary | ICD-10-CM | POA: Diagnosis not present

## 2023-03-27 DIAGNOSIS — G20A2 Parkinson's disease without dyskinesia, with fluctuations: Secondary | ICD-10-CM | POA: Diagnosis not present

## 2023-03-27 DIAGNOSIS — R29898 Other symptoms and signs involving the musculoskeletal system: Secondary | ICD-10-CM | POA: Diagnosis not present

## 2023-03-27 DIAGNOSIS — R41841 Cognitive communication deficit: Secondary | ICD-10-CM | POA: Diagnosis not present

## 2023-03-27 DIAGNOSIS — R2689 Other abnormalities of gait and mobility: Secondary | ICD-10-CM | POA: Insufficient documentation

## 2023-03-28 NOTE — Therapy (Signed)
OUTPATIENT SPEECH LANGUAGE PATHOLOGY PARKINSON'S EVALUATION   Patient Name: Matthew [name] MRN: [id number] DOB:1951-03-01, 72 y.o., male Today's Date: 03/28/2023  PCP: Matthew Cranker, MD REFERRING PROVIDER: Kerin Salen, DO  END OF SESSION:  End of Session - 03/28/23 1225     Visit Number 2    Number of Visits 17    Date for SLP Re-Evaluation 05/25/23    SLP Start Time 1621    SLP Stop Time  1704    SLP Time Calculation (min) 43 min    Activity Tolerance Patient tolerated treatment well             Past Medical History:  Diagnosis Date   Anxiety    Arthritis    GERD (gastroesophageal reflux disease)    occ remote history   Hypertension    Parkinson disease (HCC)    Prostate cancer (HCC)    Right knee pain    questionable meniscal tear   Seasonal allergies    Past Surgical History:  Procedure Laterality Date   COLONOSCOPY     Leg Laceration Repair  Left    Age 73   PELVIC LYMPH NODE DISSECTION Bilateral 03/22/2018   Procedure: PELVIC LYMPH NODE DISSECTION;  Surgeon: Matthew Paci, MD;  Location: [facility];  Service: Urology;  Laterality: Bilateral;   PROSTATE BIOPSY     ROBOT ASSISTED LAPAROSCOPIC RADICAL PROSTATECTOMY N/A 03/22/2018   Procedure: XI ROBOTIC ASSISTED LAPAROSCOPIC PROSTATECTOMY;  Surgeon: Matthew Paci, MD;  Location: [facility];  Service: Urology;  Laterality: N/A;   TOTAL KNEE ARTHROPLASTY Right 05/05/2019   Procedure: RIGHT TOTAL KNEE ARTHROPLASTY;  Surgeon: Matthew Birchwood, MD;  Location: [facility];  Service: Orthopedics;  Laterality: Right;   Patient Active Problem List   Diagnosis Date Noted   S/P TKR (total knee replacement), right 05/05/2019   Osteoarthritis of right knee 05/02/2019   Prostate cancer (HCC) 03/22/2018   Malignant neoplasm of prostate (HCC) 02/27/2018    ONSET DATE: script dated 03-06-23  REFERRING DIAG: Parkinson's Disease  THERAPY DIAG:  Dysarthria and anarthria  Dysphagia, unspecified type  Cognitive  communication deficit  Aphasia  Rationale for Evaluation and Treatment: Rehabilitation  SUBJECTIVE:   SUBJECTIVE STATEMENT: "My talking is bad, Matthew [name]. Matthew [name] can't hear me." Pt accompanied by: self  PERTINENT HISTORY: Pt well-known to this SLP from previous courses of ST. MBS in August 2023- results below. Pt's last ST course resulted in pt partially meeting goal for producing speech in upper 60s dB in simple to mod complex with occasional min A.  PAIN:  Are you having pain? No  FALLS: Has patient fallen in last 6 months?  See PT evaluation for details  LIVING ENVIRONMENT: Lives with: lives with their family Lives in: House/apartment  PLOF:  Level of assistance: Needed assistance with ADLs, Needed assistance with IADLS "Matthew [name] cuts my food for me these days" Employment: Retired  PATIENT GOALS: Improve loudness and fluency  OBJECTIVE:   DIAGNOSTIC FINDINGS:  MBS 12-22-21 Patient presents with functional oropharyngeal swallowing without aspiration of any consistency tested. He does demonstrate minimal asymptomatic laryngeal penetration of thin liquid due to decreased timing of laryngeal closure. Chin tuck posture with use of straw effective to prevent penetration. Pharyngeal swallow is strong without retention. Oral transiting discoordination noted with pt swallowing barium tablet as he required 2nd bolus of thin to transit through oropharynx. Upon esophageal sweep, pt appeared with barium tablet retained in esophagus with pt awareness. Tablet appeared to transit distally after several boluses of liquids.  After tablet had transited, pt continued with "phantom" sensation to esophagus. Recommend pt continue diet as tolerated, using chin tuck with liquids if improves comfort, decreases cough. Also encouraged pt to assure maintains strength of voice, cough and expectoration for airway protection.   COGNITION: Overall cognitive status: Impaired Areas of impairment: Following commands and  Problem solving, and word finding (slower processing)   MOTOR SPEECH: Overall motor speech: impaired Level of impairment: Word Respiration: thoracic breathing and clavicular breathing Phonation: breathy and low vocal intensity (see below for measurements) Resonance: WFL Articulation: Appears intact Intelligibility: Intelligible Effective technique: increased vocal intensity (See below)  ORAL MOTOR EXAMINATION: Overall status: Impaired:   Labial: Bilateral (ROM and Coordination) Lingual: Bilateral (ROM and Coordination) Facial: Bilateral (ROM) Comments: better ROM with visual cues; pt smiled once during evaluation  OBJECTIVE VOICE ASSESSMENT: Sustained loud "ah" maximum phonation time: 13.5 seconds Sustained loud "ah" loudness average: 84 dB Oral reading (passage) loudness average: 68 dB Oral reading loudness range: 61-72 dB Conversational loudness average: 65 dB Conversational loudness range: 58-71 dB Voice quality: breathy and low vocal intensity Stimulability trials: Given SLP modeling and rare min cues, loudness average increased to average 69dB (range of 62 to 72); However after 3 minutes of simple conversation. loudness decreased to average 65 dB. Cognitive load moderately negatively impacted pt's ability to achieve WNL loudness in conversation.  Pt does report difficulty with swallowing which SLP assessed today SUBJECTIVE DYSPHAGIA REPORTS:  Date of onset: prior to 2023 Reported symptoms: coughing with liquids  Current diet: regular, thin liquids, and current diet modifications: avoids foods which are difficult to keep on fork/spoon  Co-morbid voice changes: No  FACTORS WHICH MAY INCREASE RISK OF ADVERSE EVENT IN PRESENCE OF ASPIRATION:  General health: average  Risk factors: reduced cognitive function    ORAL MOTOR EXAMINATION: as above  CLINICAL SWALLOW ASSESSMENT:   Dentition: adequate natural dentition Vocal quality at baseline: low vocal intensity Patient  directly observed with POs: Yes: thin liquids  Feeding: needs set up Liquids provided by: straw Yale Swallow Protocol: not assessed Oral phase signs and symptoms: pt reports anterior leakage rarely with liquids; Anterior leakage during the day  Pharyngeal phase signs and symptoms: immediate throat clear; When pt used chin tuck posture 2/2, he said, "That felt good" and "It went down well."  PATIENT REPORTED OUTCOME MEASURES (PROM): Communication Effectiveness Survey: to be completed in first 2 sessions  TODAY'S TREATMENT:                                                                                                                                         DATE:  03/27/23: Pt arrived and greeted SLP with 70dB average, and then decr'd to a soft volume once he sat down in ST room (average 64dB). Initial cues for louder speech and tactile cue for feeling strength in his voice from abdomen, thinking about effort for /a/.  SLP engaged pt in conversation for 38 minutes, with conversational segments of 8-13 minutes, x4. SLP req'd occasional mod cues for pt to improve loudness, faded to occasional min-mod cues. Pt demonstrated decr'd stamina approx 32 minutes into conversational segments. SLP reiterated the necessity to perform loud /a/ at home as prescribed when pt went to lobby to meet wife.   03/21/23 (eval): SPEECH: SLP explained eval results to pt. SLP ensured pt knew the importance of daily loud /a/ and everyday sentences practice BID.  SWALLOWING: Additionally, he and SLP practiced chin down posture with a straw and with a cup.  PATIENT EDUCATION: Education details: see "today's treatment" Person educated: Patient Education method: Explanation and Demonstration Education comprehension: verbalized understanding  HOME EXERCISE PROGRAM: Loud /a/ and everyday sentences.    GOALS: Goals reviewed with patient? No  SHORT TERM GOALS: Target date: 04/20/23  Produce /a/ with average upper 80s dB  over three sessions Baseline: Goal status: INITIAL  2.  pt will demo abdominal breathing with sentence responses 85% accuracy x 3 sessions  Baseline:  Goal status: INITIAL  3.  pt will generate 5 minutes simple conversation with low 70s dB average in 3 sessions  Baseline:  Goal status: INITIAL  4.  Pt will use strategies for dysfluency in 1-2 minute short conversational segments  Baseline:  Goal status: INITIAL   LONG TERM GOALS: Target date: 05/20/23  pt will produce average upper 60s dB in 10 minutes simple to mod complex conversation in 5 sessions  Baseline:  Goal status: INITIAL  2.  pt will maintain upper 60s dB average in 10 minute simple conversation x 3 sessions  Baseline:  Goal status: INITIAL  3.  pt will use abdominal breathing >80% of the time during 10 minute simple conversation in 3 sessions  Baseline:  Goal status: INITIAL  4.  pt will report fewer requests to repeat himself than prior to ST in 3 sessions  Baseline:  Goal status: INITIAL  5.  Pt will use strategies for safer swallowing in 3 sessions with modified independence  Baseline:  Goal status: INITIAL  6.  Pt will score higher/better on PROM in the last 2 weeks of therapy Baseline:  Goal status: INITIAL  ASSESSMENT:  CLINICAL IMPRESSION: Patient is a 72 y.o. male who was seen today for treatment of dysarthria and swallowing in light of Parkinsons Disease. Pt told SLP on eval date that family members ask him to repeat frequently, and he frequency of communicating in social situations has decr'd since last session of previous therapy course. Pt was able to improve loudness in simple conversation for 3 minutes and thus is an excellent candidate for ST.  Additionally when pt used chin tuck posture with liquids as told on MBS in August 2023, he felt safer.  OBJECTIVE IMPAIRMENTS: Objective impairments include executive functioning, aphasia, dysarthria, and dysphagia. These impairments are limiting  patient from household responsibilities, ADLs/IADLs, effectively communicating at home and in community, and safety when swallowing.Factors affecting potential to achieve goals and functional outcome are  none .Marland Kitchen Patient will benefit from skilled SLP services to address above impairments and improve overall function.  REHAB POTENTIAL: Good  PLAN:  SLP FREQUENCY: 2x/week  SLP DURATION: 8 weeks  PLANNED INTERVENTIONS: Aspiration precaution training, Diet toleration management , Environmental controls, Cueing hierachy, Cognitive reorganization, Internal/external aids, Oral motor exercises, Functional tasks, Multimodal communication approach, SLP instruction and feedback, Compensatory strategies, and Patient/family education    [facility], CCC-SLP 03/28/2023, 12:25 PM

## 2023-03-29 ENCOUNTER — Other Ambulatory Visit: Payer: Self-pay

## 2023-03-29 ENCOUNTER — Other Ambulatory Visit (HOSPITAL_COMMUNITY): Payer: Self-pay

## 2023-03-29 DIAGNOSIS — Z6825 Body mass index (BMI) 25.0-25.9, adult: Secondary | ICD-10-CM | POA: Diagnosis not present

## 2023-03-29 DIAGNOSIS — L02212 Cutaneous abscess of back [any part, except buttock]: Secondary | ICD-10-CM | POA: Diagnosis not present

## 2023-03-29 NOTE — Progress Notes (Signed)
Duplicate activity.

## 2023-03-29 NOTE — Progress Notes (Signed)
Specialty Pharmacy Refill Coordination Note  Fredrik Mogel is a 72 y.o. male contacted today regarding refills of specialty medication(s) Rimabotulinumtoxinb   Patient requested Courier to Provider Office   Delivery date: 04/09/23   Verified address: [address], [place], [address]   Medication will be filled on 04/06/23.

## 2023-04-02 NOTE — Therapy (Signed)
OUTPATIENT PHYSICAL THERAPY NEURO TREATMENT   Patient Name: Matthew [name] MRN: [id number] DOB:11-09-1950, 72 y.o., male Today's Date: 04/03/2023   PCP: Daisy Floro, MD REFERRING PROVIDER: Tat, Octaviano Batty, DO  END OF SESSION:  PT End of Session - 04/03/23 1232     Visit Number 3    Number of Visits 17    Date for PT Re-Evaluation 05/16/23    Authorization Type [employer]    Progress Note Due on Visit 10    PT Start Time 1150    PT Stop Time 1231    PT Time Calculation (min) 41 min    Equipment Utilized During Treatment Gait belt    Activity Tolerance Patient tolerated treatment well    Behavior During Therapy WFL for tasks assessed/performed               Past Medical History:  Diagnosis Date   Anxiety    Arthritis    GERD (gastroesophageal reflux disease)    occ remote history   Hypertension    Parkinson disease (HCC)    Prostate cancer (HCC)    Right knee pain    questionable meniscal tear   Seasonal allergies    Past Surgical History:  Procedure Laterality Date   COLONOSCOPY     Leg Laceration Repair  Left    Age 38   PELVIC LYMPH NODE DISSECTION Bilateral 03/22/2018   Procedure: PELVIC LYMPH NODE DISSECTION;  Surgeon: Rene Paci, MD;  Location: [facility];  Service: Urology;  Laterality: Bilateral;   PROSTATE BIOPSY     ROBOT ASSISTED LAPAROSCOPIC RADICAL PROSTATECTOMY N/A 03/22/2018   Procedure: XI ROBOTIC ASSISTED LAPAROSCOPIC PROSTATECTOMY;  Surgeon: Rene Paci, MD;  Location: [facility];  Service: Urology;  Laterality: N/A;   TOTAL KNEE ARTHROPLASTY Right 05/05/2019   Procedure: RIGHT TOTAL KNEE ARTHROPLASTY;  Surgeon: Gean Birchwood, MD;  Location: [facility];  Service: Orthopedics;  Laterality: Right;   Patient Active Problem List   Diagnosis Date Noted   S/P TKR (total knee replacement), right 05/05/2019   Osteoarthritis of right knee 05/02/2019   Prostate cancer (HCC) 03/22/2018   Malignant neoplasm of  prostate (HCC) 02/27/2018    ONSET DATE: PD 2021  REFERRING DIAG: G20.A2 (ICD-10-CM) - Parkinson's disease without dyskinesia, with fluctuating manifestations (HCC)   THERAPY DIAG:  Other symptoms and signs involving the nervous system  Muscle weakness (generalized)  Abnormal posture  Unsteadiness on feet  Other symptoms and signs involving the musculoskeletal system  Other abnormalities of gait and mobility  Parkinson's disease without dyskinesia, with fluctuating manifestations (HCC)  Rationale for Evaluation and Treatment: Rehabilitation  SUBJECTIVE:  SUBJECTIVE STATEMENT: Denies new falls. Reports that he tried bed mobility technique performed last session but still has trouble.   Pt accompanied by: self  PERTINENT HISTORY: PD, R TKR, prostate CA, HTN  PAIN:  Are you having pain? Yes: NPRS scale: 0/10 Pain location: upper back/lower back "it moves" Bilateral shoulders intermittently painful Pain description: hard to describe Aggravating factors: certain movements/times Relieving factors: rest  PRECAUTIONS: Fall  RED FLAGS: None   WEIGHT BEARING RESTRICTIONS: No  FALLS: Has patient fallen in last 6 months? Yes. Number of falls "several"  LIVING ENVIRONMENT: Lives with: lives with their family Lives in: House/apartment Stairs: Yes, 2-3 steps to enter Has following equipment at home: Dan Humphreys - 4 wheeled and hospital bed  PLOF: Needs assistance with ADLs, Needs assistance with transfers, and bed mobility  PATIENT GOALS: improve balance, mobility, strength. "Get as normal as I can". Be able to step up into son's Jeep  OBJECTIVE:     TODAY'S TREATMENT: 04/03/23 Activity Comments  Nustep L6 x 6 min UEs/LEs  Maintaining ~55-65 SPM  review of sit<>supine Pt with poor  recall; required min A for LE management for sit>supine and cues for scooting. Then min A to assist in sidelying>sit  review of supine PWR moves Up 10x  [name] 10x  twist  10x  Step 10x  Cueing for proper positioning and full activation of extremities. Required min A for Twist d/t difficulty rolling. Movements appeared effortful but pt with good recall of these   supine HS stretch with strap 30" each Very tight and required PT assist to get into proper positioning   hooklying fig 4, KTOS 30" each To tolerance; some assist for comfortable positioning especially on tighter R LE     HOME EXERCISE PROGRAM Last updated: 03/27/23 Supine PWR! Moves 10x each daily   Access Code: [email or web address] URL: [email or web address] Date: 04/03/2023 Prepared by: [facility] - [facility]  Exercises - Supine Hamstring Stretch with Strap  - 1 x daily - 5 x weekly - 2 sets - 30 sec hold - Supine Piriformis Stretch with Foot on Ground  - 1 x daily - 5 x weekly - 2 sets - 30 sec hold - Supine Figure 4 Piriformis Stretch  - 1 x daily - 5 x weekly - 2 sets - 30 sec hold     PATIENT EDUCATION: Education details: HEP update; re-iterated recommendation to lay flat in bed (as long as pt does not have cardiac or breathing issues limiting) for max stretch/posture, requested that wife come in to next PT session to review/troubleshoot bed mobility Person educated: Patient Education method: Explanation, Demonstration, Tactile cues, Verbal cues, and Handouts Education comprehension: verbalized understanding and returned demonstration    Note: Objective measures were completed at Evaluation unless otherwise noted.  DIAGNOSTIC FINDINGS: n/a for this episode  COGNITION: Overall cognitive status:  notes increased issues with memory   SENSATION: Not tested, denies parasthesias  COORDINATION: Deficits with rapid alternating movements Finger to nose intact    MUSCLE TONE: BLE  rigidity  MUSCLE LENGTH: Hamstrings: Right -30 deg; Left -30 deg   DTRs:  NT  POSTURE: rounded shoulders, forward head, increased thoracic kyphosis, and flexed trunk   LOWER EXTREMITY ROM:     Active  Right Eval Left Eval  Hip flexion    Hip extension    Hip abduction    Hip adduction    Hip internal rotation    Hip external rotation    Knee flexion  Knee extension -30 -30  Ankle dorsiflexion 10 10  Ankle plantarflexion    Ankle inversion    Ankle eversion     (Blank rows = not tested)  LOWER EXTREMITY MMT:    BLE 4/5 gross strength  BED MOBILITY:  Moderate assist  TRANSFERS: Assistive device utilized: Environmental consultant - 4 wheeled  Sit to stand: Complete Independence and Modified independence Stand to sit: Complete Independence Chair to chair: Complete Independence and Modified independence Floor:  NT    CURB:  Level of Assistance: CGA Assistive device utilized: Environmental consultant - 4 wheeled Curb Comments:   STAIRS: Reports stairs to 2nd floor but does not use 2-3 stairs to enter home  GAIT: Gait pattern: knee flexed in stance- Right and knee flexed in stance- Left Distance walked:  Assistive device utilized: Environmental consultant - 4 wheeled Level of assistance: Modified independence and SBA Comments:   FUNCTIONAL TESTS:  Mini-BESTest  11/28  Timed Up and Go test:26 sec with 4WW (compared to 15 sec)   10 meter walk test:15.31 sec = 2.14 ft/sec  (compared to 2.01 ft/sec)   5 time sit to stand test: 23 sec-(compared to 11 sec)    PATIENT EDUCATION: Education details: assessment details, rationale of intervention Person educated: Patient Education method: Explanation Education comprehension: verbalized understanding  HOME EXERCISE PROGRAM: TBD-reports daily performance 20 reps standing PWR! moves  GOALS: Goals reviewed with patient? Yes  SHORT TERM GOALS: Target date: 04/18/2023    Patient will be independent in HEP to improve functional outcomes Baseline: Goal  status: IN PROGRESS  2.  Demo improved BLE strength and balance per time 15 sec 5xSTS test Baseline: 23 Goal status: IN PROGRESS  3.  Demo improved safety with ambulation per time 15 sec TUG test Baseline:  Goal status: IN PROGRESS    LONG TERM GOALS: Target date: 05/16/2023    Improve hamstring flexibility to -15 degrees to reduce rigidity and enable greater knee extension ROM for mobility Baseline: -30 Goal status: IN PROGRESS  2.  Reduce risk for falls per score 20/28 Mini-BESTest Baseline: 11/20 Goal status: IN PROGRESS  3.  Demo modified independent bed mobility to reduce level of assist from caregivers  Baseline: moderate assist  Goal status: IN PROGRESS  ASSESSMENT:  CLINICAL IMPRESSION: Patient arrived to session with report of continued difficulty with bed mobility. Reviewed sit<>supine transfers with cueing for positioning and repositioning. Good carryover of supine PWR moves demonstrates, however movements are quite effortful, especially with wt shifting and rolling. LE stretching was performed as patient has limited knee and hip ROM, likely contributing to difficulty with bed mobility and posture. Pt reported understanding of HEP update and without complaints at end of session.   OBJECTIVE IMPAIRMENTS: Abnormal gait, decreased activity tolerance, decreased balance, decreased coordination, decreased mobility, difficulty walking, decreased ROM, decreased strength, impaired flexibility, impaired tone, improper body mechanics, postural dysfunction, and pain.   ACTIVITY LIMITATIONS: carrying, lifting, bending, standing, squatting, stairs, transfers, bed mobility, reach over head, and locomotion level  PARTICIPATION LIMITATIONS: meal prep, cleaning, laundry, interpersonal relationship, shopping, community activity, yard work, and hobbies of music  PERSONAL FACTORS: Age, Past/current experiences, Time since onset of injury/illness/exacerbation, and 1-2 comorbidities: PMH   are also affecting patient's functional outcome.   REHAB POTENTIAL: Good  CLINICAL DECISION MAKING: Evolving/moderate complexity  EVALUATION COMPLEXITY: Moderate  PLAN:  PT FREQUENCY: 2x/week  PT DURATION: 8 weeks  PLANNED INTERVENTIONS: 97110-Therapeutic exercises, 97530- Therapeutic activity, [id number]- Neuromuscular re-education, 97535- Self Care, 16109- Manual therapy, [id number]- Gait training, [phone]-  Orthotic Fit/training, [id number]- Canalith repositioning, [id number]- Aquatic Therapy, Patient/Family education, Balance training, Stair training, Taping, Dry Needling, Joint mobilization, Spinal mobilization, Vestibular training, DME instructions, Cryotherapy, and Moist heat  PLAN FOR NEXT SESSION: review/troubleshoot bed mobility with wife present; review standing PWR moves HEP, supine power moves and stretching   Anette Guarneri, PT, DPT 04/03/23 12:36 PM  [facility] [address], [address] [place], [place] [address] Phone # [phone] Fax # [phone]

## 2023-04-03 ENCOUNTER — Encounter: Payer: Self-pay | Admitting: Physical Therapy

## 2023-04-03 ENCOUNTER — Ambulatory Visit: Payer: PPO | Admitting: Occupational Therapy

## 2023-04-03 ENCOUNTER — Ambulatory Visit: Payer: PPO | Admitting: Physical Therapy

## 2023-04-03 ENCOUNTER — Ambulatory Visit: Payer: PPO

## 2023-04-03 DIAGNOSIS — R471 Dysarthria and anarthria: Secondary | ICD-10-CM

## 2023-04-03 DIAGNOSIS — M6281 Muscle weakness (generalized): Secondary | ICD-10-CM

## 2023-04-03 DIAGNOSIS — R29818 Other symptoms and signs involving the nervous system: Secondary | ICD-10-CM

## 2023-04-03 DIAGNOSIS — G20A2 Parkinson's disease without dyskinesia, with fluctuations: Secondary | ICD-10-CM

## 2023-04-03 DIAGNOSIS — R278 Other lack of coordination: Secondary | ICD-10-CM

## 2023-04-03 DIAGNOSIS — R29898 Other symptoms and signs involving the musculoskeletal system: Secondary | ICD-10-CM

## 2023-04-03 DIAGNOSIS — R2681 Unsteadiness on feet: Secondary | ICD-10-CM

## 2023-04-03 DIAGNOSIS — R2689 Other abnormalities of gait and mobility: Secondary | ICD-10-CM

## 2023-04-03 DIAGNOSIS — R293 Abnormal posture: Secondary | ICD-10-CM

## 2023-04-03 DIAGNOSIS — R41841 Cognitive communication deficit: Secondary | ICD-10-CM

## 2023-04-03 NOTE — Therapy (Signed)
OUTPATIENT SPEECH LANGUAGE PATHOLOGY PARKINSON'S TREATMENT   Patient Name: Matthew [name] MRN: [id number] DOB:01-31-1951, 72 y.o., male Today's Date: 04/03/2023  PCP: Gildardo Cranker, MD REFERRING PROVIDER: Kerin Salen, DO  END OF SESSION:  End of Session - 04/03/23 1106     Visit Number 3    Number of Visits 17    Date for SLP Re-Evaluation 05/25/23    SLP Start Time 1105    SLP Stop Time  1145    SLP Time Calculation (min) 40 min    Activity Tolerance Patient tolerated treatment well             Past Medical History:  Diagnosis Date   Anxiety    Arthritis    GERD (gastroesophageal reflux disease)    occ remote history   Hypertension    Parkinson disease (HCC)    Prostate cancer (HCC)    Right knee pain    questionable meniscal tear   Seasonal allergies    Past Surgical History:  Procedure Laterality Date   COLONOSCOPY     Leg Laceration Repair  Left    Age 50   PELVIC LYMPH NODE DISSECTION Bilateral 03/22/2018   Procedure: PELVIC LYMPH NODE DISSECTION;  Surgeon: Rene Paci, MD;  Location: [facility];  Service: Urology;  Laterality: Bilateral;   PROSTATE BIOPSY     ROBOT ASSISTED LAPAROSCOPIC RADICAL PROSTATECTOMY N/A 03/22/2018   Procedure: XI ROBOTIC ASSISTED LAPAROSCOPIC PROSTATECTOMY;  Surgeon: Rene Paci, MD;  Location: [facility];  Service: Urology;  Laterality: N/A;   TOTAL KNEE ARTHROPLASTY Right 05/05/2019   Procedure: RIGHT TOTAL KNEE ARTHROPLASTY;  Surgeon: Gean Birchwood, MD;  Location: [facility];  Service: Orthopedics;  Laterality: Right;   Patient Active Problem List   Diagnosis Date Noted   S/P TKR (total knee replacement), right 05/05/2019   Osteoarthritis of right knee 05/02/2019   Prostate cancer (HCC) 03/22/2018   Malignant neoplasm of prostate (HCC) 02/27/2018    ONSET DATE: script dated 03-06-23  REFERRING DIAG: Parkinson's Disease  THERAPY DIAG:  Dysarthria and anarthria  Cognitive communication  deficit  Rationale for Evaluation and Treatment: Rehabilitation  SUBJECTIVE:   SUBJECTIVE STATEMENT: "Pretty good, [name] how are you." (WNL volume) Pt accompanied by: self  PERTINENT HISTORY: Pt well-known to this SLP from previous courses of ST. MBS in August 2023- results below. Pt's last ST course resulted in pt partially meeting goal for producing speech in upper 60s dB in simple to mod complex with occasional min A.  PAIN:  Are you having pain? No  FALLS: Has patient fallen in last 6 months?  See PT evaluation for details  LIVING ENVIRONMENT: Lives with: lives with their family Lives in: House/apartment  PLOF:  Level of assistance: Needed assistance with ADLs, Needed assistance with IADLS "[name] cuts my food for me these days" Employment: Retired  PATIENT GOALS: Improve loudness and fluency  OBJECTIVE:   DIAGNOSTIC FINDINGS:  MBS 12-22-21 Patient presents with functional oropharyngeal swallowing without aspiration of any consistency tested. He does demonstrate minimal asymptomatic laryngeal penetration of thin liquid due to decreased timing of laryngeal closure. Chin tuck posture with use of straw effective to prevent penetration. Pharyngeal swallow is strong without retention. Oral transiting discoordination noted with pt swallowing barium tablet as he required 2nd bolus of thin to transit through oropharynx. Upon esophageal sweep, pt appeared with barium tablet retained in esophagus with pt awareness. Tablet appeared to transit distally after several boluses of liquids. After tablet had transited, pt continued with "  phantom" sensation to esophagus. Recommend pt continue diet as tolerated, using chin tuck with liquids if improves comfort, decreases cough. Also encouraged pt to assure maintains strength of voice, cough and expectoration for airway protection.    PATIENT REPORTED OUTCOME MEASURES (PROM): Communication Effectiveness Survey: provided session #3  TODAY'S TREATMENT:                                                                                                                                          DATE:  04/03/23: SLP engaged pt with loud /a/ to encourage muscle memory to make louder speech more habitual. Average loudness 85dB. Initial cues for loudness but pt independent after that. Everyday sentences were read with average 81dB.  Word level responses with higher cognitive load completed with average 70dB.  03/27/23: Pt arrived and greeted SLP with 70dB average, and then decr'd to a soft volume once he sat down in ST room (average 64dB). Initial cues for louder speech and tactile cue for feeling strength in his voice from abdomen, thinking about effort for /a/.  SLP engaged pt in conversation for 38 minutes, with conversational segments of 8-13 minutes, x4. SLP req'd occasional mod cues for pt to improve loudness, faded to occasional min-mod cues. Pt demonstrated decr'd stamina approx 32 minutes into conversational segments. SLP reiterated the necessity to perform loud /a/ at home as prescribed when pt went to lobby to meet wife.   03/21/23 (eval): SPEECH: SLP explained eval results to pt. SLP ensured pt knew the importance of daily loud /a/ and everyday sentences practice BID.  SWALLOWING: Additionally, he and SLP practiced chin down posture with a straw and with a cup.  PATIENT EDUCATION: Education details: see "today's treatment" Person educated: Patient Education method: Explanation and Demonstration Education comprehension: verbalized understanding  HOME EXERCISE PROGRAM: Loud /a/ and everyday sentences.    GOALS: Goals reviewed with patient? No  SHORT TERM GOALS: Target date: 04/20/23  Produce /a/ with average upper 80s dB over three sessions Baseline: Goal status: INITIAL  2.  pt will demo abdominal breathing with sentence responses 85% accuracy x 3 sessions  Baseline:  Goal status: INITIAL  3.  pt will generate 5 minutes simple  conversation with low 70s dB average in 3 sessions  Baseline:  Goal status: INITIAL  4.  Pt will use strategies for dysfluency in 1-2 minute short conversational segments  Baseline:  Goal status: INITIAL   LONG TERM GOALS: Target date: 05/20/23  pt will produce average upper 60s dB in 10 minutes simple to mod complex conversation in 5 sessions  Baseline:  Goal status: INITIAL  2.  pt will maintain upper 60s dB average in 10 minute simple conversation x 3 sessions  Baseline:  Goal status: INITIAL  3.  pt will use abdominal breathing >80% of the time during 10 minute simple conversation in 3 sessions  Baseline:  Goal status:  INITIAL  4.  pt will report fewer requests to repeat himself than prior to ST in 3 sessions  Baseline:  Goal status: INITIAL  5.  Pt will use strategies for safer swallowing in 3 sessions with modified independence  Baseline:  Goal status: INITIAL  6.  Pt will score higher/better on PROM in the last 2 weeks of therapy Baseline:  Goal status: INITIAL  ASSESSMENT:  CLINICAL IMPRESSION: Patient is a 72 y.o. male who was seen today for treatment of dysarthria and swallowing in light of Parkinsons Disease. Pt told SLP on eval date that family members ask him to repeat frequently, and he frequency of communicating in social situations has decr'd since last session of previous therapy course. Pt was able to improve loudness in simple conversation for 3 minutes and thus is an excellent candidate for ST.  Additionally when pt used chin tuck posture with liquids as told on MBS in August 2023, he felt safer.  OBJECTIVE IMPAIRMENTS: Objective impairments include executive functioning, aphasia, dysarthria, and dysphagia. These impairments are limiting patient from household responsibilities, ADLs/IADLs, effectively communicating at home and in community, and safety when swallowing.Factors affecting potential to achieve goals and functional outcome are  none .Marland Kitchen Patient  will benefit from skilled SLP services to address above impairments and improve overall function.  REHAB POTENTIAL: Good  PLAN:  SLP FREQUENCY: 2x/week  SLP DURATION: 8 weeks  PLANNED INTERVENTIONS: Aspiration precaution training, Diet toleration management , Environmental controls, Cueing hierachy, Cognitive reorganization, Internal/external aids, Oral motor exercises, Functional tasks, Multimodal communication approach, SLP instruction and feedback, Compensatory strategies, and Patient/family education    [facility], CCC-SLP 04/03/2023, 11:21 AM

## 2023-04-03 NOTE — Therapy (Signed)
OUTPATIENT OCCUPATIONAL THERAPY PARKINSON'S Treatment Note  Patient Name: Matthew [name] MRN: [id number] DOB:27-Feb-1951, 72 y.o., male Today's Date: 04/03/2023  PCP: Daisy Floro, MD REFERRING PROVIDER: Tat, Octaviano Batty, DO  END OF SESSION:  OT End of Session - 04/03/23 1347     Visit Number 2    Number of Visits 17    Date for OT Re-Evaluation 05/16/23    Authorization Type [employer]    OT Start Time 1231    OT Stop Time 1315    OT Time Calculation (min) 44 min              Past Medical History:  Diagnosis Date   Anxiety    Arthritis    GERD (gastroesophageal reflux disease)    occ remote history   Hypertension    Parkinson disease (HCC)    Prostate cancer (HCC)    Right knee pain    questionable meniscal tear   Seasonal allergies    Past Surgical History:  Procedure Laterality Date   COLONOSCOPY     Leg Laceration Repair  Left    Age 59   PELVIC LYMPH NODE DISSECTION Bilateral 03/22/2018   Procedure: PELVIC LYMPH NODE DISSECTION;  Surgeon: Rene Paci, MD;  Location: [facility];  Service: Urology;  Laterality: Bilateral;   PROSTATE BIOPSY     ROBOT ASSISTED LAPAROSCOPIC RADICAL PROSTATECTOMY N/A 03/22/2018   Procedure: XI ROBOTIC ASSISTED LAPAROSCOPIC PROSTATECTOMY;  Surgeon: Rene Paci, MD;  Location: [facility];  Service: Urology;  Laterality: N/A;   TOTAL KNEE ARTHROPLASTY Right 05/05/2019   Procedure: RIGHT TOTAL KNEE ARTHROPLASTY;  Surgeon: Gean Birchwood, MD;  Location: [facility];  Service: Orthopedics;  Laterality: Right;   Patient Active Problem List   Diagnosis Date Noted   S/P TKR (total knee replacement), right 05/05/2019   Osteoarthritis of right knee 05/02/2019   Prostate cancer (HCC) 03/22/2018   Malignant neoplasm of prostate (HCC) 02/27/2018    ONSET DATE: referral date 03/06/23  REFERRING DIAG: G20.A2 (ICD-10-CM) - Parkinson's disease without dyskinesia, with fluctuating manifestations  THERAPY DIAG:   Other symptoms and signs involving the nervous system  Muscle weakness (generalized)  Abnormal posture  Unsteadiness on feet  Other lack of coordination  Rationale for Evaluation and Treatment: Rehabilitation  SUBJECTIVE:   SUBJECTIVE STATEMENT: Pt reports "I'm a mess today" and reports that "everything is working against him today". Pt accompanied by: self and significant other  PERTINENT HISTORY: PD, anxiety, arthritis, GERD, HTN, R TKR   PRECAUTIONS: Fall  WEIGHT BEARING RESTRICTIONS: No  PAIN:  Are you having pain? No  FALLS: Has patient fallen in last 6 months? Yes. Number of falls Pt reports "having a time where he fell frequently" but has fallen ~3 times in the last "few months" with most recent being Saturday.  LIVING ENVIRONMENT: Lives with: lives with their spouse Lives in: House/apartment Stairs:  Yes: Internal: full flight of step, but able to stay on main floor without need to navigate steps; and External: 2 steps; on right going up Has following equipment at home: Single point cane, Quad cane small base, Walker - 2 wheeled, Environmental consultant - 4 wheeled, Wheelchair (manual), shower chair, Grab bars, and transport chair - for dr appts  PLOF: Requires assistive device for independence and Needs assistance with ADLs  PATIENT GOALS: to be as close to normal as possible  OBJECTIVE:  Note: Objective measures were completed at Evaluation unless otherwise noted.  HAND DOMINANCE: Right  ADLs: Overall ADLs: reports  overall slowing, wife is "second pair of hands" Transfers/ambulation related to ADLs: utilizing small 4 wheeled walker for all mobility  Eating: wife assisting with cutting foods, pt spilling foods off utensil, constantly spilling things off the plate, having to use finger to stabilize food when scooping with R hand Grooming: utilizing electric toothbrush and water pick for oral care, difficulty with brushing hair  UB Dressing: primarily wearing t-shirts/pull  over shirts, occasional assist from wife if in a hurry or get stuck, no longer wearing button up shirts anymore.  Wife will assist with donning shirt if completing after shower LB Dressing: difficulty with reaching forward towards feet when donning pants, socks, shoes; increased difficulty when crossing leg , no longer wearing lace up shoes Toileting: requires momentum to stand up from toilet or will reach towards door for leverage/momentum, reports "struggle" with hygiene  Bathing: occasional assist for thoroughness when drying off  Tub Shower transfers: wife provides supervision, handle is just inside door and pt able to hold on to it while stepping over small ledge Equipment: Grab bars, Walk in shower, and Long handled sponge  IADLs: Spouse completes all shopping, meal prep, and housekeeping tasks. Community mobility: Relies on family or friends for transportation Medication management: pt fills pill box and is able to take correct dosages at correct time Financial management: wife completes and did prior Handwriting:  TBA  MOBILITY STATUS: Hx of falls and difficulty carrying objections with ambulation  POSTURE COMMENTS:  forward head  ACTIVITY TOLERANCE: Activity tolerance: diminished  FUNCTIONAL OUTCOME MEASURES: Physical performance test: PPT#2 (simulated eating) 25.91 & PPT#4 (donning/doffing jacket): TBA  COORDINATION: 9 Hole Peg test: Right: 1:36.03 sec; Left: 58.28 sec  UE ROM:   B shoulder flexion ~120 *, reports mild discomfort in R upper arm with internal rotation but able to achieve full ROM  UE MMT:    grossly 4/5 bilaterally  MUSCLE TONE: RUE: Mild and LUE: Mild  COGNITION: Overall cognitive status:  slower processing and speech  OBSERVATIONS: Bradykinesia   TODAY'S TREATMENT:                                                                                 DATE:  04/03/23 Large amplitude: engaged in reaching with boom whackers to tap out to R and L to  facilitate increased shoulder ROM and open chest.  Pt reports mild discomfort with RUE.  Increased challenge with providing pattern with reaching up and down to scarves/targets on poles.  Incorporated crossing midline to facilitate increased reach and weight shifting.  Transitioned to tapping boom whackers over head  to facilitate increased shoulder flexion and abduction.   Pt maintains arms in front of body vs overhead due to decreased shoulder ROM/tightness.  Completed additional set of 5 with crossing midline and set of 5 when reaching out to side.  OT providing cues to open hand large when reaching without boom whackers.  Shoulder ROM: engaged in shoulder shrugs and scapular retraction with OT positioned behind pt and providing tactile cues for increased ROM.  Completed x5 each.    03/21/23 NA, eval only    PATIENT EDUCATION: Education details: Educated on role and purpose of OT as well  as potential interventions and goals for therapy based on initial evaluation findings. Person educated: Patient Education method: Explanation Education comprehension: needs further education  HOME EXERCISE PROGRAM: TBD  GOALS: Goals reviewed with patient? No  SHORT TERM GOALS: Target date: 04/18/23  Pt will be Independent with PD specific HEP. Baseline: Goal status: IN PROGRESS  2.  Pt will demonstrate improved shoulder flexion bilaterally to retreive a lightweight object from overhead shelf with BUE at >/= 130 shoulder flexion.  Baseline:  Goal status: IN PROGRESS  3.  Pt will verbalize understanding of adapted strategies and DME/AE PRN (reacher, BSC, shower chair, rocker knife, scoop plate/plate guard, etc) to maximize safety and independence with ADLs/IADLs.  Baseline:  Goal status: IN PROGRESS  4.  Pt will report understanding of modifications and adaptive strategies to improve handwriting legibility and ease. Baseline:  Goal status: IN PROGRESS   LONG TERM GOALS: Target date:  05/16/23  Pt will demonstrate increased functional ROM and use of BUE to aid in clothing management and hygiene with toileting tasks.  Baseline:  Goal status: IN PROGRESS  2.  Pt will demonstrate improved ability to get up from lower seat/toilet without assistance with DME PRN. Baseline:  Goal status: IN PROGRESS  3.  Pt will demonstrate improved fine motor coordination for ADLs as evidenced by decreasing 9 hole peg test score for RUE by 10 secs on R and 1 or fewer drops. Baseline: R: 1:36 and L: 58 sec.  Pt reports dropping small items frequently Goal status: IN PROGRESS  4.  Pt will demonstrate improved ease with feeding as evidenced by decreasing PPT#2 (self feeding) by 5 secs  Baseline: 25.91 Goal status: IN PROGRESS  5.  Pt will demonstrate and/or report improved independence with LB dressing with use of AE/DME as needed.  Baseline:  Goal status: IN PROGRESS  ASSESSMENT:  CLINICAL IMPRESSION: Pt demonstrating decreased shoulder ROM, especially with abduction and shoulder flexion.  Pt with some cracking in B shoulders, especially R shoulder with particular movements.  OT encouraged pt to focus on quality of movement and educated on completing in supine and in sitting to allow for increased/decreased effects of gravity on movement.  Pt benefits from targets for increased amplitude.    PERFORMANCE DEFICITS: in functional skills including ADLs, IADLs, coordination, ROM, strength, flexibility, Fine motor control, Gross motor control, balance, body mechanics, endurance, decreased knowledge of precautions, decreased knowledge of use of DME, and UE functional use and psychosocial skills including coping strategies, environmental adaptation, and routines and behaviors.   IMPAIRMENTS: are limiting patient from ADLs, IADLs, and rest and sleep.     PLAN:  OT FREQUENCY: 2x/week  OT DURATION: 8 weeks  PLANNED INTERVENTIONS: 97168 OT Re-evaluation, 97535 self care/ADL training, 16109  therapeutic exercise, 97530 therapeutic activity, 97112 neuromuscular re-education, 97035 ultrasound, 97010 moist heat, 97010 cryotherapy, functional mobility training, psychosocial skills training, energy conservation, coping strategies training, patient/family education, and DME and/or AE instructions  RECOMMENDED OTHER SERVICES: NA  CONSULTED AND AGREED WITH PLAN OF CARE: Patient  PLAN FOR NEXT SESSION: Review and add to large amplitude exercises, initiate bag exercises with focus on improved shoulder ROM   Dmonte Maher, OTR/L 04/03/2023, 1:47 PM   [facility] [address] [place], [place] [address] Phone # [phone] Fax # [phone]

## 2023-04-06 ENCOUNTER — Other Ambulatory Visit: Payer: Self-pay

## 2023-04-09 ENCOUNTER — Other Ambulatory Visit: Payer: Self-pay | Admitting: Neurology

## 2023-04-09 DIAGNOSIS — G20A2 Parkinson's disease without dyskinesia, with fluctuations: Secondary | ICD-10-CM

## 2023-04-10 ENCOUNTER — Ambulatory Visit: Payer: PPO | Admitting: Physical Therapy

## 2023-04-10 ENCOUNTER — Ambulatory Visit: Payer: PPO

## 2023-04-10 ENCOUNTER — Encounter: Payer: Self-pay | Admitting: Physical Therapy

## 2023-04-10 ENCOUNTER — Ambulatory Visit: Payer: PPO | Admitting: Occupational Therapy

## 2023-04-10 DIAGNOSIS — R29818 Other symptoms and signs involving the nervous system: Secondary | ICD-10-CM

## 2023-04-10 DIAGNOSIS — M6281 Muscle weakness (generalized): Secondary | ICD-10-CM

## 2023-04-10 DIAGNOSIS — R41841 Cognitive communication deficit: Secondary | ICD-10-CM

## 2023-04-10 DIAGNOSIS — R471 Dysarthria and anarthria: Secondary | ICD-10-CM

## 2023-04-10 DIAGNOSIS — R1310 Dysphagia, unspecified: Secondary | ICD-10-CM

## 2023-04-10 DIAGNOSIS — R278 Other lack of coordination: Secondary | ICD-10-CM

## 2023-04-10 DIAGNOSIS — R293 Abnormal posture: Secondary | ICD-10-CM

## 2023-04-10 DIAGNOSIS — G20A2 Parkinson's disease without dyskinesia, with fluctuations: Secondary | ICD-10-CM | POA: Diagnosis not present

## 2023-04-10 DIAGNOSIS — R2681 Unsteadiness on feet: Secondary | ICD-10-CM

## 2023-04-10 NOTE — Therapy (Signed)
OUTPATIENT SPEECH LANGUAGE PATHOLOGY PARKINSON'S TREATMENT   Patient Name: Matthew [name] MRN: [id number] DOB:13-May-1951, 72 y.o., male Today's Date: 04/10/2023  PCP: Gildardo Cranker, MD REFERRING PROVIDER: Kerin Salen, DO  END OF SESSION:  End of Session - 04/10/23 1518     Visit Number 4    Number of Visits 17    Date for SLP Re-Evaluation 05/25/23    SLP Start Time 1450    SLP Stop Time  1530    SLP Time Calculation (min) 40 min    Activity Tolerance Patient tolerated treatment well             Past Medical History:  Diagnosis Date   Anxiety    Arthritis    GERD (gastroesophageal reflux disease)    occ remote history   Hypertension    Parkinson disease (HCC)    Prostate cancer (HCC)    Right knee pain    questionable meniscal tear   Seasonal allergies    Past Surgical History:  Procedure Laterality Date   COLONOSCOPY     Leg Laceration Repair  Left    Age 76   PELVIC LYMPH NODE DISSECTION Bilateral 03/22/2018   Procedure: PELVIC LYMPH NODE DISSECTION;  Surgeon: Rene Paci, MD;  Location: [facility];  Service: Urology;  Laterality: Bilateral;   PROSTATE BIOPSY     ROBOT ASSISTED LAPAROSCOPIC RADICAL PROSTATECTOMY N/A 03/22/2018   Procedure: XI ROBOTIC ASSISTED LAPAROSCOPIC PROSTATECTOMY;  Surgeon: Rene Paci, MD;  Location: [facility];  Service: Urology;  Laterality: N/A;   TOTAL KNEE ARTHROPLASTY Right 05/05/2019   Procedure: RIGHT TOTAL KNEE ARTHROPLASTY;  Surgeon: Gean Birchwood, MD;  Location: [facility];  Service: Orthopedics;  Laterality: Right;   Patient Active Problem List   Diagnosis Date Noted   S/P TKR (total knee replacement), right 05/05/2019   Osteoarthritis of right knee 05/02/2019   Prostate cancer (HCC) 03/22/2018   Malignant neoplasm of prostate (HCC) 02/27/2018    ONSET DATE: script dated 03-06-23  REFERRING DIAG: Parkinson's Disease  THERAPY DIAG:  Dysarthria and anarthria  Cognitive communication  deficit  Dysphagia, unspecified type  Rationale for Evaluation and Treatment: Rehabilitation  SUBJECTIVE:   SUBJECTIVE STATEMENT: "Doing good, Abigail Marsiglia." (WNL volume) Pt accompanied by: self  PERTINENT HISTORY: Pt well-known to this SLP from previous courses of ST. MBS in August 2023- results below. Pt's last ST course resulted in pt partially meeting goal for producing speech in upper 60s dB in simple to mod complex with occasional min A.  PAIN:  Are you having pain? No  FALLS: Has patient fallen in last 6 months?  See PT evaluation for details  PATIENT GOALS: Improve loudness and fluency  OBJECTIVE:   DIAGNOSTIC FINDINGS:  MBS 12-22-21 Patient presents with functional oropharyngeal swallowing without aspiration of any consistency tested. He does demonstrate minimal asymptomatic laryngeal penetration of thin liquid due to decreased timing of laryngeal closure. Chin tuck posture with use of straw effective to prevent penetration. Pharyngeal swallow is strong without retention. Oral transiting discoordination noted with pt swallowing barium tablet as he required 2nd bolus of thin to transit through oropharynx. Upon esophageal sweep, pt appeared with barium tablet retained in esophagus with pt awareness. Tablet appeared to transit distally after several boluses of liquids. After tablet had transited, pt continued with "phantom" sensation to esophagus. Recommend pt continue diet as tolerated, using chin tuck with liquids if improves comfort, decreases cough. Also encouraged pt to assure maintains strength of voice, cough and expectoration for airway protection.  PATIENT REPORTED OUTCOME MEASURES (PROM): Communication Effectiveness Survey: provided session #3  TODAY'S TREATMENT:                                                                                                                                         DATE:  04/10/23: Loud /a/ targeted today to make louder speech more habitual.  Average loudness 85dB. Everyday sentences read with average loudness 83dB. SLP engaged in conversational topics (3-4 minutes) x5; SLP req'd to provide min-mod cues, rarely - which increased to mod cues occasionally by last topic. Pt acknowledged he was fatigued from PT where they worked on bed mobility. Pt provided 2 sentence responses with average 65dB with rare min-mod A for loudness. Other multiple sentence responses were challenging today due to decr'd divided attention negatively impacting pt performance today more than previous sessions, likely due to fatigue factor. SLP encouraged pt to cont to practice at home. "I always practice with my grandkids because I don't want them to have to ask me to repeat like [name] does."  04/03/23: SLP engaged pt with loud /a/ to encourage muscle memory to make louder speech more habitual. Average loudness 85dB. Initial cues for loudness but pt independent after that. Everyday sentences were read with average 81dB.  Word level responses with higher cognitive load completed with average 70dB.  03/27/23: Pt arrived and greeted SLP with 70dB average, and then decr'd to a soft volume once he sat down in ST room (average 64dB). Initial cues for louder speech and tactile cue for feeling strength in his voice from abdomen, thinking about effort for /a/.  SLP engaged pt in conversation for 38 minutes, with conversational segments of 8-13 minutes, x4. SLP req'd occasional mod cues for pt to improve loudness, faded to occasional min-mod cues. Pt demonstrated decr'd stamina approx 32 minutes into conversational segments. SLP reiterated the necessity to perform loud /a/ at home as prescribed when pt went to lobby to meet wife.   03/21/23 (eval): SPEECH: SLP explained eval results to pt. SLP ensured pt knew the importance of daily loud /a/ and everyday sentences practice BID.  SWALLOWING: Additionally, he and SLP practiced chin down posture with a straw and with a cup.  PATIENT  EDUCATION: Education details: see "today's treatment" Person educated: Patient Education method: Explanation and Demonstration Education comprehension: verbalized understanding  HOME EXERCISE PROGRAM: Loud /a/ and everyday sentences.    GOALS: Goals reviewed with patient? No  SHORT TERM GOALS: Target date: 04/20/23  Produce /a/ with average mid-upper 80s dB over three sessions Baseline: Goal status: Modified  2.  pt will demo abdominal breathing with sentence responses 85% accuracy x 3 sessions  Baseline:  Goal status: INITIAL  3.  pt will generate 2 minutes simple conversation with upper 60s dB average in 2 sessions  Baseline:  Goal status: Modified  4.  Pt will use strategies for dysfluency in 1-2  minute short conversational segments  Baseline:  Goal status: INITIAL   LONG TERM GOALS: Target date: 05/20/23  pt will produce average upper 60s dB in 10 minutes simple to mod complex conversation in 5 sessions  Baseline:  Goal status: INITIAL  2.  pt will maintain upper 60s dB average in 10 minute simple conversation x 3 sessions  Baseline:  Goal status: INITIAL  3.  pt will use abdominal breathing >80% of the time during 10 minute simple conversation in 3 sessions  Baseline:  Goal status: INITIAL  4.  pt will report fewer requests to repeat himself than prior to ST in 3 sessions  Baseline:  Goal status: INITIAL  5.  Pt will use strategies for safer swallowing in 3 sessions with modified independence  Baseline:  Goal status: INITIAL  6.  Pt will score higher/better on PROM in the last 2 weeks of therapy Baseline:  Goal status: INITIAL  ASSESSMENT:  CLINICAL IMPRESSION: STGs 1 and 3 downgraded today due to pt response to ST thus far. Patient is a 72 y.o. male who was seen today for treatment of dysarthria in light of Parkinsons Disease. Pt told SLP on eval date that family members ask him to repeat frequently, and he frequency of communicating in social  situations has decr'd since last session of previous therapy course. Pt was able to improve loudness in simple conversation for 3 minutes and thus is an excellent candidate for ST.  Additionally when pt used chin tuck posture with liquids as told on MBS in August 2023, he felt safer.  OBJECTIVE IMPAIRMENTS: Objective impairments include executive functioning, aphasia, dysarthria, and dysphagia. These impairments are limiting patient from household responsibilities, ADLs/IADLs, effectively communicating at home and in community, and safety when swallowing.Factors affecting potential to achieve goals and functional outcome are  none .Marland Kitchen Patient will benefit from skilled SLP services to address above impairments and improve overall function.  REHAB POTENTIAL: Good  PLAN:  SLP FREQUENCY: 2x/week  SLP DURATION: 8 weeks  PLANNED INTERVENTIONS: Aspiration precaution training, Diet toleration management , Environmental controls, Cueing hierachy, Cognitive reorganization, Internal/external aids, Oral motor exercises, Functional tasks, Multimodal communication approach, SLP instruction and feedback, Compensatory strategies, and Patient/family education    [facility], CCC-SLP 04/10/2023, 3:19 PM

## 2023-04-10 NOTE — Patient Instructions (Signed)
BED MOBILITY:  To get into the bed:  -Sit at the bed-SCOOT  BACK as FAR as you can  -[name] Side to side to get momentum-then [name] 1 (BIG), 2 (BIGGER), 3 (BIGGEST) to lie down and swing your feet up onto the bed -STEP and SCOOT to get in position in the bed   To get out of the bed:  [facility] your knees, then [name] your knees side to side to loosen up your legs -[name] knees to the side with REACHING your left arm up and across toward the bed rail, [name] 1 (BIG), 2 (BIGGER), 3 (BIGGEST) to swing feet off the bed, PUSH up through your right elbow to sit up on the side of the bed

## 2023-04-10 NOTE — Therapy (Signed)
OUTPATIENT OCCUPATIONAL THERAPY PARKINSON'S Treatment Note  Patient Name: Jalynn Hutzell MRN: [id number] DOB:July 31, 1950, 72 y.o., male Today's Date: 04/10/2023  PCP: Daisy Floro, MD REFERRING PROVIDER: Tat, Octaviano Batty, DO  END OF SESSION:  OT End of Session - 04/10/23 1327     Visit Number 3    Number of Visits 17    Date for OT Re-Evaluation 05/16/23    Authorization Type [employer]    OT Start Time 1322   pt arrival time   OT Stop Time 1400    OT Time Calculation (min) 38 min               Past Medical History:  Diagnosis Date   Anxiety    Arthritis    GERD (gastroesophageal reflux disease)    occ remote history   Hypertension    Parkinson disease (HCC)    Prostate cancer (HCC)    Right knee pain    questionable meniscal tear   Seasonal allergies    Past Surgical History:  Procedure Laterality Date   COLONOSCOPY     Leg Laceration Repair  Left    Age 34   PELVIC LYMPH NODE DISSECTION Bilateral 03/22/2018   Procedure: PELVIC LYMPH NODE DISSECTION;  Surgeon: Rene Paci, MD;  Location: [facility];  Service: Urology;  Laterality: Bilateral;   PROSTATE BIOPSY     ROBOT ASSISTED LAPAROSCOPIC RADICAL PROSTATECTOMY N/A 03/22/2018   Procedure: XI ROBOTIC ASSISTED LAPAROSCOPIC PROSTATECTOMY;  Surgeon: Rene Paci, MD;  Location: [facility];  Service: Urology;  Laterality: N/A;   TOTAL KNEE ARTHROPLASTY Right 05/05/2019   Procedure: RIGHT TOTAL KNEE ARTHROPLASTY;  Surgeon: Gean Birchwood, MD;  Location: [facility];  Service: Orthopedics;  Laterality: Right;   Patient Active Problem List   Diagnosis Date Noted   S/P TKR (total knee replacement), right 05/05/2019   Osteoarthritis of right knee 05/02/2019   Prostate cancer (HCC) 03/22/2018   Malignant neoplasm of prostate (HCC) 02/27/2018    ONSET DATE: referral date 03/06/23  REFERRING DIAG: G20.A2 (ICD-10-CM) - Parkinson's disease without dyskinesia, with fluctuating  manifestations  THERAPY DIAG:  Other symptoms and signs involving the nervous system  Muscle weakness (generalized)  Abnormal posture  Other lack of coordination  Rationale for Evaluation and Treatment: Rehabilitation  SUBJECTIVE:   SUBJECTIVE STATEMENT: Pt reports no falls recently.  "It seems to be harder to do things, especially smaller movements." Pt accompanied by: self and significant other  PERTINENT HISTORY: PD, anxiety, arthritis, GERD, HTN, R TKR   PRECAUTIONS: Fall  WEIGHT BEARING RESTRICTIONS: No  PAIN:  Are you having pain? No  FALLS: Has patient fallen in last 6 months? Yes. Number of falls Pt reports "having a time where he fell frequently" but has fallen ~3 times in the last "few months" with most recent being Saturday.  LIVING ENVIRONMENT: Lives with: lives with their spouse Lives in: House/apartment Stairs:  Yes: Internal: full flight of step, but able to stay on main floor without need to navigate steps; and External: 2 steps; on right going up Has following equipment at home: Single point cane, Quad cane small base, Walker - 2 wheeled, Environmental consultant - 4 wheeled, Wheelchair (manual), shower chair, Grab bars, and transport chair - for dr appts  PLOF: Requires assistive device for independence and Needs assistance with ADLs  PATIENT GOALS: to be as close to normal as possible  OBJECTIVE:  Note: Objective measures were completed at Evaluation unless otherwise noted.  HAND DOMINANCE: Right  ADLs:  Overall ADLs: reports overall slowing, wife is "second pair of hands" Transfers/ambulation related to ADLs: utilizing small 4 wheeled walker for all mobility  Eating: wife assisting with cutting foods, pt spilling foods off utensil, constantly spilling things off the plate, having to use finger to stabilize food when scooping with R hand Grooming: utilizing electric toothbrush and water pick for oral care, difficulty with brushing hair  UB Dressing: primarily wearing  t-shirts/pull over shirts, occasional assist from wife if in a hurry or get stuck, no longer wearing button up shirts anymore.  Wife will assist with donning shirt if completing after shower LB Dressing: difficulty with reaching forward towards feet when donning pants, socks, shoes; increased difficulty when crossing leg , no longer wearing lace up shoes Toileting: requires momentum to stand up from toilet or will reach towards door for leverage/momentum, reports "struggle" with hygiene  Bathing: occasional assist for thoroughness when drying off  Tub Shower transfers: wife provides supervision, handle is just inside door and pt able to hold on to it while stepping over small ledge Equipment: Grab bars, Walk in shower, and Long handled sponge  IADLs: Spouse completes all shopping, meal prep, and housekeeping tasks. Community mobility: Relies on family or friends for transportation Medication management: pt fills pill box and is able to take correct dosages at correct time Financial management: wife completes and did prior Handwriting:  TBA  MOBILITY STATUS: Hx of falls and difficulty carrying objections with ambulation  POSTURE COMMENTS:  forward head  ACTIVITY TOLERANCE: Activity tolerance: diminished  FUNCTIONAL OUTCOME MEASURES: Physical performance test: PPT#2 (simulated eating) 25.91 & PPT#4 (donning/doffing jacket): TBA  COORDINATION: 9 Hole Peg test: Right: 1:36.03 sec; Left: 58.28 sec  UE ROM:   B shoulder flexion ~120 *, reports mild discomfort in R upper arm with internal rotation but able to achieve full ROM  UE MMT:    grossly 4/5 bilaterally  MUSCLE TONE: RUE: Mild and LUE: Mild  COGNITION: Overall cognitive status:  slower processing and speech  OBSERVATIONS: Bradykinesia   TODAY'S TREATMENT:                                                                                 DATE:  04/10/23 Large amplitude: pt with difficulty removing tissue from small tissue  pack.  OT encouraged pt to complete finger flicks, therefore engaged in finger flicks with focus on large opening of hands and then returned to returning extra tissue to pack with improved motor control. Bag exercises: attempted bag exercises with focus on large amplitude movements as needed for increased ease with UB dressing and drying back.  Pt demonstrating decreased shoulder flexion and abduction as well as difficulty tossing scarf over ipsilateral shoulder, especially on L side.   Large amplitude: engaged in reaching across midline and outside BOS with placing clothespins onto target and then placing cups onto elevated target to facilitate increased amplitude and reach. Cards: engaged in forearm supination and large hand opening when picking up cards and then placing with large amplitude supination to place onto stacks by color.  OT providing demonstration and verbal cues for large opening of hand and even stopping task to complete finger flicks as needed  to further open hand.     04/03/23 Large amplitude: engaged in reaching with boom whackers to tap out to R and L to facilitate increased shoulder ROM and open chest.  Pt reports mild discomfort with RUE.  Increased challenge with providing pattern with reaching up and down to scarves/targets on poles.  Incorporated crossing midline to facilitate increased reach and weight shifting.  Transitioned to tapping boom whackers over head  to facilitate increased shoulder flexion and abduction.   Pt maintains arms in front of body vs overhead due to decreased shoulder ROM/tightness.  Completed additional set of 5 with crossing midline and set of 5 when reaching out to side.  OT providing cues to open hand large when reaching without boom whackers.  Shoulder ROM: engaged in shoulder shrugs and scapular retraction with OT positioned behind pt and providing tactile cues for increased ROM.  Completed x5 each.    03/21/23 NA, eval only    PATIENT  EDUCATION: Education details: condition specific education, large amplitude exercises Person educated: Patient Education method: Explanation Education comprehension: needs further education  HOME EXERCISE PROGRAM: Access Code: [email or web address] URL: [email or web address] Date: 04/10/2023 Prepared by: [facility] - Outpatient  Rehab - [facility]  Exercises - Seated Shoulder Shrugs  - 2 x daily - 5 reps - Seated Scapular Retraction  - 2 x daily - 5 reps - Seated Finger Flicks with Elbow Extension  - 1 x daily - 2 sets - 5-10 reps - Seated Reaching to Side and Across Body  - 1 x daily - 2 sets - 5-10 reps - Seated Reaching to Side  - 1 x daily - 2 sets - 5-10 reps  GOALS: Goals reviewed with patient? No  SHORT TERM GOALS: Target date: 04/18/23  Pt will be Independent with PD specific HEP. Baseline: Goal status: IN PROGRESS  2.  Pt will demonstrate improved shoulder flexion bilaterally to retreive a lightweight object from overhead shelf with BUE at >/= 130 shoulder flexion.  Baseline:  Goal status: IN PROGRESS  3.  Pt will verbalize understanding of adapted strategies and DME/AE PRN (reacher, BSC, shower chair, rocker knife, scoop plate/plate guard, etc) to maximize safety and independence with ADLs/IADLs.  Baseline:  Goal status: IN PROGRESS  4.  Pt will report understanding of modifications and adaptive strategies to improve handwriting legibility and ease. Baseline:  Goal status: IN PROGRESS   LONG TERM GOALS: Target date: 05/16/23  Pt will demonstrate increased functional ROM and use of BUE to aid in clothing management and hygiene with toileting tasks.  Baseline:  Goal status: IN PROGRESS  2.  Pt will demonstrate improved ability to get up from lower seat/toilet without assistance with DME PRN. Baseline:  Goal status: IN PROGRESS  3.  Pt will demonstrate improved fine motor coordination for ADLs as evidenced by decreasing 9 hole peg test score for RUE by  10 secs on R and 1 or fewer drops. Baseline: R: 1:36 and L: 58 sec.  Pt reports dropping small items frequently Goal status: IN PROGRESS  4.  Pt will demonstrate improved ease with feeding as evidenced by decreasing PPT#2 (self feeding) by 5 secs  Baseline: 25.91 Goal status: IN PROGRESS  5.  Pt will demonstrate and/or report improved independence with LB dressing with use of AE/DME as needed.  Baseline:  Goal status: IN PROGRESS  ASSESSMENT:  CLINICAL IMPRESSION: Pt demonstrating decreased shoulder ROM, especially with abduction and shoulder flexion - even impacting ease of tossing scarf over shoulder to  simulate aspects of UB dressing and drying back.  Pt benefits from targets for increased amplitude with reaching outside BOS and across midline.  Pt also demonstrating improvements with [facility] tasks when utilizing large amplitude movements and finger flicks prior to completing small/FMC tasks.   PERFORMANCE DEFICITS: in functional skills including ADLs, IADLs, coordination, ROM, strength, flexibility, Fine motor control, Gross motor control, balance, body mechanics, endurance, decreased knowledge of precautions, decreased knowledge of use of DME, and UE functional use and psychosocial skills including coping strategies, environmental adaptation, and routines and behaviors.   IMPAIRMENTS: are limiting patient from ADLs, IADLs, and rest and sleep.     PLAN:  OT FREQUENCY: 2x/week  OT DURATION: 8 weeks  PLANNED INTERVENTIONS: 97168 OT Re-evaluation, 97535 self care/ADL training, 70350 therapeutic exercise, 97530 therapeutic activity, 97112 neuromuscular re-education, 97035 ultrasound, 97010 moist heat, 97010 cryotherapy, functional mobility training, psychosocial skills training, energy conservation, coping strategies training, patient/family education, and DME and/or AE instructions  RECOMMENDED OTHER SERVICES: NA  CONSULTED AND AGREED WITH PLAN OF CARE: Patient  PLAN FOR NEXT  SESSION: Review and add to large amplitude exercises, initiate bag exercises with focus on improved shoulder ROM, FMC tasks/HEP   Oliviarose Punch, OTR/L 04/10/2023, 1:27 PM   [facility] at [facility] [address], [address] [place], [place] [address] Phone # [phone] Fax # [phone]

## 2023-04-10 NOTE — Therapy (Signed)
OUTPATIENT PHYSICAL THERAPY NEURO TREATMENT   Patient Name: Matthew [name] MRN: [id number] DOB:[birth year], 72 y.o., male Today's Date: 04/10/2023   PCP: Daisy Floro, MD REFERRING PROVIDER: Tat, Octaviano Batty, DO  END OF SESSION:  PT End of Session - 04/10/23 1404     Visit Number 4    Number of Visits 17    Date for PT Re-Evaluation 05/16/23    Authorization Type [employer]    Progress Note Due on Visit 10    PT Start Time 1403    PT Stop Time 1445    PT Time Calculation (min) 42 min    Equipment Utilized During Treatment Gait belt    Activity Tolerance Patient tolerated treatment well    Behavior During Therapy WFL for tasks assessed/performed                Past Medical History:  Diagnosis Date   Anxiety    Arthritis    GERD (gastroesophageal reflux disease)    occ remote history   Hypertension    Parkinson disease (HCC)    Prostate cancer (HCC)    Right knee pain    questionable meniscal tear   Seasonal allergies    Past Surgical History:  Procedure Laterality Date   COLONOSCOPY     Leg Laceration Repair  Left    Age 52   PELVIC LYMPH NODE DISSECTION Bilateral 03/22/2018   Procedure: PELVIC LYMPH NODE DISSECTION;  Surgeon: Rene Paci, MD;  Location: [facility];  Service: Urology;  Laterality: Bilateral;   PROSTATE BIOPSY     ROBOT ASSISTED LAPAROSCOPIC RADICAL PROSTATECTOMY N/A 03/22/2018   Procedure: XI ROBOTIC ASSISTED LAPAROSCOPIC PROSTATECTOMY;  Surgeon: Rene Paci, MD;  Location: [facility];  Service: Urology;  Laterality: N/A;   TOTAL KNEE ARTHROPLASTY Right 05/05/2019   Procedure: RIGHT TOTAL KNEE ARTHROPLASTY;  Surgeon: Gean Birchwood, MD;  Location: [facility];  Service: Orthopedics;  Laterality: Right;   Patient Active Problem List   Diagnosis Date Noted   S/P TKR (total knee replacement), right 05/05/2019   Osteoarthritis of right knee 05/02/2019   Prostate cancer (HCC) 03/22/2018   Malignant neoplasm of  prostate (HCC) 02/27/2018    ONSET DATE: PD 2021  REFERRING DIAG: G20.A2 (ICD-10-CM) - Parkinson's disease without dyskinesia, with fluctuating manifestations (HCC)   THERAPY DIAG:  Other symptoms and signs involving the nervous system  Muscle weakness (generalized)  Abnormal posture  Unsteadiness on feet  Rationale for Evaluation and Treatment: Rehabilitation  SUBJECTIVE:  SUBJECTIVE STATEMENT: Nothing new.  Want to work on bed mobility.     Pt accompanied by: self, wife  PERTINENT HISTORY: PD, R TKR, prostate CA, HTN  PAIN:  Are you having pain? Yes: NPRS scale: 3-5/10 Pain location: upper back/lower back "it moves" Bilateral shoulders intermittently painful Pain description: hard to describe Aggravating factors: certain movements/times Relieving factors: rest  PRECAUTIONS: Fall  RED FLAGS: None   WEIGHT BEARING RESTRICTIONS: No  FALLS: Has patient fallen in last 6 months? Yes. Number of falls "several"  LIVING ENVIRONMENT: Lives with: lives with their family Lives in: House/apartment Stairs: Yes, 2-3 steps to enter Has following equipment at home: Dan Humphreys - 4 wheeled and hospital bed  PLOF: Needs assistance with ADLs, Needs assistance with transfers, and bed mobility  PATIENT GOALS: improve balance, mobility, strength. "Get as normal as I can". Be able to step up into son's Jeep  OBJECTIVE:   04/10/2023: THERAPEUTIC ACTIVITY: Practiced bed mobility:   -Pt and wife show they way they get into bed and pt demo increased rigidity through trunk and lower legs, as wife has to help with BLE management into bed.  To get out of bed, pt needs mod assist of wife, with HOB elevated to bring trunk up to sit EOB.  -Simulated bed set up as best as possible for home, using 22" mat with 2  pillows and wedge at pt's R side upon sitting EOB.  Also used RW at lowest setting to simulate bed rail.  -Practiced x 3 reps the following for sit<>supine and supine>sit through R sidelying.  PT provides cues and initial mod>min assist.  Wife able to return demo assist on 3rd rep.   To get into the bed:  -Sit at the bed as close to the rail as you can-SCOOT  BACK as FAR as you can  -[name] Side to side to get momentum-then [name] 1 (BIG), 2 (BIGGER), 3 (BIGGEST) to lie down and swing your feet up onto the bed (*needs wife's assist) -STEP and SCOOT to get in position in the bed   To get out of the bed:  -Bend your knees, then [name] your knees side to side to loosen up your legs (performing lower trunk rotation) -[name] knees to the side with REACHING your left arm up and across toward the bed rail, [name] 1 (BIG), 2 (BIGGER), 3 (BIGGEST) towards R side, then swing feet off the bed, PUSH up through your right elbow to sit up on the side of the bed (needs min assist)     HOME EXERCISE PROGRAM Last updated: 03/27/23 Supine PWR! Moves 10x each daily   Access Code: [email or web address] URL: [email or web address] Date: 04/03/2023 Prepared by: [facility] - Outpatient  Rehab - [facility]  Exercises - Supine Hamstring Stretch with Strap  - 1 x daily - 5 x weekly - 2 sets - 30 sec hold - Supine Piriformis Stretch with Foot on Ground  - 1 x daily - 5 x weekly - 2 sets - 30 sec hold - Supine Figure 4 Piriformis Stretch  - 1 x daily - 5 x weekly - 2 sets - 30 sec hold     PATIENT EDUCATION: Education details: Bed mobility techniques (using some of the *modified* PWR! Moves in supine to rock, use momentum for less caregiver assistance) Person educated: Patient, wife Education method: Explanation, Demonstration, Actor cues, Verbal cues, and Handouts Education comprehension: verbalized understanding, returned demonstration, verbal cues required, and needs further education    Note:  Objective  measures were completed at Evaluation unless otherwise noted.  DIAGNOSTIC FINDINGS: n/a for this episode  COGNITION: Overall cognitive status:  notes increased issues with memory   SENSATION: Not tested, denies parasthesias  COORDINATION: Deficits with rapid alternating movements Finger to nose intact    MUSCLE TONE: BLE rigidity  MUSCLE LENGTH: Hamstrings: Right -30 deg; Left -30 deg   DTRs:  NT  POSTURE: rounded shoulders, forward head, increased thoracic kyphosis, and flexed trunk   LOWER EXTREMITY ROM:     Active  Right Eval Left Eval  Hip flexion    Hip extension    Hip abduction    Hip adduction    Hip internal rotation    Hip external rotation    Knee flexion    Knee extension -30 -30  Ankle dorsiflexion 10 10  Ankle plantarflexion    Ankle inversion    Ankle eversion     (Blank rows = not tested)  LOWER EXTREMITY MMT:    BLE 4/5 gross strength  BED MOBILITY:  Moderate assist  TRANSFERS: Assistive device utilized: Environmental consultant - 4 wheeled  Sit to stand: Complete Independence and Modified independence Stand to sit: Complete Independence Chair to chair: Complete Independence and Modified independence Floor:  NT    CURB:  Level of Assistance: CGA Assistive device utilized: Environmental consultant - 4 wheeled Curb Comments:   STAIRS: Reports stairs to 2nd floor but does not use 2-3 stairs to enter home  GAIT: Gait pattern: knee flexed in stance- Right and knee flexed in stance- Left Distance walked:  Assistive device utilized: Environmental consultant - 4 wheeled Level of assistance: Modified independence and SBA Comments:   FUNCTIONAL TESTS:  Mini-BESTest  11/28  Timed Up and Go test:26 sec with 4WW (compared to 15 sec)   10 meter walk test:15.31 sec = 2.14 ft/sec  (compared to 2.01 ft/sec)   5 time sit to stand test: 23 sec-(compared to 11 sec)    PATIENT EDUCATION: Education details: assessment details, rationale of intervention Person educated:  Patient Education method: Explanation Education comprehension: verbalized understanding  HOME EXERCISE PROGRAM: TBD-reports daily performance 20 reps standing PWR! moves  GOALS: Goals reviewed with patient? Yes  SHORT TERM GOALS: Target date: 04/18/2023    Patient will be independent in HEP to improve functional outcomes Baseline: Goal status: IN PROGRESS  2.  Demo improved BLE strength and balance per time 15 sec 5xSTS test Baseline: 23 Goal status: IN PROGRESS  3.  Demo improved safety with ambulation per time 15 sec TUG test Baseline:  Goal status: IN PROGRESS    LONG TERM GOALS: Target date: 05/16/2023    Improve hamstring flexibility to -15 degrees to reduce rigidity and enable greater knee extension ROM for mobility Baseline: -30 Goal status: IN PROGRESS  2.  Reduce risk for falls per score 20/28 Mini-BESTest Baseline: 11/20 Goal status: IN PROGRESS  3.  Demo modified independent bed mobility to reduce level of assist from caregivers  Baseline: moderate assist  Goal status: IN PROGRESS  ASSESSMENT:  CLINICAL IMPRESSION: Wife present for session today, and focus was on bed mobility, to help with ease of caregiver burden and amount of assistance.  Pt and wife report that pt becomes very rigid at end of the day, and has difficulty with bed mobility due to rigidity (both getting in and out of the bed).  Simulated pt's bed set up at home and utilized principles of PWR! Moves, including scooting, rocking, rock and reach, and use of large amplitude, whole body  movements to gain increased momentum to complete sit<>supine.  With cues and repetition, pt tolerates well and both patient/wife pleased to see that these strategies may help ease amount of assistance caregiver has to provide.  Pt will benefit from skilled PT towards goals as well as review of bed mobility to ensure optimal carryover for home.  OBJECTIVE IMPAIRMENTS: Abnormal gait, decreased activity tolerance,  decreased balance, decreased coordination, decreased mobility, difficulty walking, decreased ROM, decreased strength, impaired flexibility, impaired tone, improper body mechanics, postural dysfunction, and pain.   ACTIVITY LIMITATIONS: carrying, lifting, bending, standing, squatting, stairs, transfers, bed mobility, reach over head, and locomotion level  PARTICIPATION LIMITATIONS: meal prep, cleaning, laundry, interpersonal relationship, shopping, community activity, yard work, and hobbies of music  PERSONAL FACTORS: Age, Past/current experiences, Time since onset of injury/illness/exacerbation, and 1-2 comorbidities: PMH  are also affecting patient's functional outcome.   REHAB POTENTIAL: Good  CLINICAL DECISION MAKING: Evolving/moderate complexity  EVALUATION COMPLEXITY: Moderate  PLAN:  PT FREQUENCY: 2x/week  PT DURATION: 8 weeks  PLANNED INTERVENTIONS: 97110-Therapeutic exercises, 97530- Therapeutic activity, 97112- Neuromuscular re-education, [phone]- Self Care, 60454- Manual therapy, [phone]- Gait training, [phone]- Orthotic Fit/training, [phone]- Canalith repositioning, [phone]- Aquatic Therapy, Patient/Family education, Balance training, Stair training, Taping, Dry Needling, Joint mobilization, Spinal mobilization, Vestibular training, DME instructions, Cryotherapy, and Moist heat  PLAN FOR NEXT SESSION: Review bed mobility techniques with wife present; review standing PWR moves HEP, supine power moves and stretching   Lonia Blood, PT 04/10/23 3:02 PM Phone: [phone] Fax: [phone]  [facility] at [facility] [address] [place], [place] [address] Phone # [phone] Fax # [phone]

## 2023-04-11 NOTE — Therapy (Signed)
OUTPATIENT PHYSICAL THERAPY NEURO TREATMENT   Patient Name: Matthew [name] MRN: [id number] DOB:Nov 09, 1950, 72 y.o., male Today's Date: 04/12/2023   PCP: Daisy Floro, MD REFERRING PROVIDER: Tat, Octaviano Batty, DO  END OF SESSION:  PT End of Session - 04/12/23 1610     Visit Number 5    Number of Visits 17    Date for PT Re-Evaluation 05/16/23    Authorization Type [employer]    Progress Note Due on Visit 10    PT Start Time 1534    PT Stop Time 1613    PT Time Calculation (min) 39 min    Equipment Utilized During Treatment Gait belt    Activity Tolerance Patient tolerated treatment well    Behavior During Therapy WFL for tasks assessed/performed                 Past Medical History:  Diagnosis Date   Anxiety    Arthritis    GERD (gastroesophageal reflux disease)    occ remote history   Hypertension    Parkinson disease (HCC)    Prostate cancer (HCC)    Right knee pain    questionable meniscal tear   Seasonal allergies    Past Surgical History:  Procedure Laterality Date   COLONOSCOPY     Leg Laceration Repair  Left    Age 63   PELVIC LYMPH NODE DISSECTION Bilateral 03/22/2018   Procedure: PELVIC LYMPH NODE DISSECTION;  Surgeon: Rene Paci, MD;  Location: [facility];  Service: Urology;  Laterality: Bilateral;   PROSTATE BIOPSY     ROBOT ASSISTED LAPAROSCOPIC RADICAL PROSTATECTOMY N/A 03/22/2018   Procedure: XI ROBOTIC ASSISTED LAPAROSCOPIC PROSTATECTOMY;  Surgeon: Rene Paci, MD;  Location: [facility];  Service: Urology;  Laterality: N/A;   TOTAL KNEE ARTHROPLASTY Right 05/05/2019   Procedure: RIGHT TOTAL KNEE ARTHROPLASTY;  Surgeon: Gean Birchwood, MD;  Location: [facility];  Service: Orthopedics;  Laterality: Right;   Patient Active Problem List   Diagnosis Date Noted   S/P TKR (total knee replacement), right 05/05/2019   Osteoarthritis of right knee 05/02/2019   Prostate cancer (HCC) 03/22/2018   Malignant neoplasm of  prostate (HCC) 02/27/2018    ONSET DATE: PD 2021  REFERRING DIAG: G20.A2 (ICD-10-CM) - Parkinson's disease without dyskinesia, with fluctuating manifestations (HCC)   THERAPY DIAG:  Other symptoms and signs involving the nervous system  Muscle weakness (generalized)  Abnormal posture  Unsteadiness on feet  Other symptoms and signs involving the musculoskeletal system  Other abnormalities of gait and mobility  Rationale for Evaluation and Treatment: Rehabilitation  SUBJECTIVE:  SUBJECTIVE STATEMENT: "Practiced (bed mobility) at home which was a little less successful (than in clinic) however I can see how it could help."  Pt accompanied by: self, wife  PERTINENT HISTORY: PD, R TKR, prostate CA, HTN  PAIN:  Are you having pain? Yes: NPRS scale: 0/10 Pain location: upper back/lower back "it moves" Bilateral shoulders intermittently painful Pain description: hard to describe Aggravating factors: certain movements/times Relieving factors: rest  PRECAUTIONS: Fall  RED FLAGS: None   WEIGHT BEARING RESTRICTIONS: No  FALLS: Has patient fallen in last 6 months? Yes. Number of falls "several"  LIVING ENVIRONMENT: Lives with: lives with their family Lives in: House/apartment Stairs: Yes, 2-3 steps to enter Has following equipment at home: Dan Humphreys - 4 wheeled and hospital bed  PLOF: Needs assistance with ADLs, Needs assistance with transfers, and bed mobility  PATIENT GOALS: improve balance, mobility, strength. "Get as normal as I can". Be able to step up into son's Jeep  OBJECTIVE:     TODAY'S TREATMENT: 04/12/23  Used the following sequencing to review bed mobility technique from previous session: "To get into the bed:  -Sit at the bed as close to the rail as you can-SCOOT  BACK  as FAR as you can  -[name] Side to side to get momentum-then [name] 1 (BIG), 2 (BIGGER), 3 (BIGGEST) to lie down and swing your feet up onto the bed (required min A for LE management) -STEP and SCOOT to get in position in the bed    To get out of the bed:  -Bend your knees, then [name] your knees side to side to loosen up your legs (performing lower trunk rotation) -[name] knees to the side with REACHING your left arm up and across toward the bed rail, [name] 1 (BIG), 2 (BIGGER), 3 (BIGGEST) towards R side, then swing feet off the bed, PUSH up through your right elbow to sit up on the side of the bed (Pt required supervision and cueing only)    Activity Comments  Nustep L5 x 6 min UEs/LEs (dropped to L4 d/t pt unable to reach goal pace) Cues to maintain at least 80 SPM (pt had difficulty reaching this speed)  standing PWR moves Up 2x10 [name] 2x10 twist 10x Step 10x  In II bars. Small movements which start to run together. Limited weight shift. *performed a 2nd set of PWR up and rock holding red TB in hands for increased effort.        HOME EXERCISE PROGRAM Last updated: 03/27/23 Supine PWR! Moves 10x each daily   Access Code: [email or web address] URL: [email or web address] Date: 04/03/2023 Prepared by: [facility] - Outpatient  Rehab - [facility]  Exercises - Supine Hamstring Stretch with Strap  - 1 x daily - 5 x weekly - 2 sets - 30 sec hold - Supine Piriformis Stretch with Foot on Ground  - 1 x daily - 5 x weekly - 2 sets - 30 sec hold - Supine Figure 4 Piriformis Stretch  - 1 x daily - 5 x weekly - 2 sets - 30 sec hold    Note: Objective measures were completed at Evaluation unless otherwise noted.  DIAGNOSTIC FINDINGS: n/a for this episode  COGNITION: Overall cognitive status:  notes increased issues with memory   SENSATION: Not tested, denies parasthesias  COORDINATION: Deficits with rapid alternating movements Finger to nose intact    MUSCLE TONE: BLE  rigidity  MUSCLE LENGTH: Hamstrings: Right -30 deg; Left -30 deg   DTRs:  NT  POSTURE: rounded shoulders,  forward head, increased thoracic kyphosis, and flexed trunk   LOWER EXTREMITY ROM:     Active  Right Eval Left Eval  Hip flexion    Hip extension    Hip abduction    Hip adduction    Hip internal rotation    Hip external rotation    Knee flexion    Knee extension -30 -30  Ankle dorsiflexion 10 10  Ankle plantarflexion    Ankle inversion    Ankle eversion     (Blank rows = not tested)  LOWER EXTREMITY MMT:    BLE 4/5 gross strength  BED MOBILITY:  Moderate assist  TRANSFERS: Assistive device utilized: Environmental consultant - 4 wheeled  Sit to stand: Complete Independence and Modified independence Stand to sit: Complete Independence Chair to chair: Complete Independence and Modified independence Floor:  NT    CURB:  Level of Assistance: CGA Assistive device utilized: Environmental consultant - 4 wheeled Curb Comments:   STAIRS: Reports stairs to 2nd floor but does not use 2-3 stairs to enter home  GAIT: Gait pattern: knee flexed in stance- Right and knee flexed in stance- Left Distance walked:  Assistive device utilized: Environmental consultant - 4 wheeled Level of assistance: Modified independence and SBA Comments:   FUNCTIONAL TESTS:  Mini-BESTest  11/28  Timed Up and Go test:26 sec with 4WW (compared to 15 sec)   10 meter walk test:15.31 sec = 2.14 ft/sec  (compared to 2.01 ft/sec)   5 time sit to stand test: 23 sec-(compared to 11 sec)    PATIENT EDUCATION: Education details: assessment details, rationale of intervention Person educated: Patient Education method: Explanation Education comprehension: verbalized understanding  HOME EXERCISE PROGRAM: TBD-reports daily performance 20 reps standing PWR! moves  GOALS: Goals reviewed with patient? Yes  SHORT TERM GOALS: Target date: 04/18/2023    Patient will be independent in HEP to improve functional outcomes Baseline: Goal  status: IN PROGRESS  2.  Demo improved BLE strength and balance per time 15 sec 5xSTS test Baseline: 23 Goal status: IN PROGRESS  3.  Demo improved safety with ambulation per time 15 sec TUG test Baseline:  Goal status: IN PROGRESS    LONG TERM GOALS: Target date: 05/16/2023    Improve hamstring flexibility to -15 degrees to reduce rigidity and enable greater knee extension ROM for mobility Baseline: -30 Goal status: IN PROGRESS  2.  Reduce risk for falls per score 20/28 Mini-BESTest Baseline: 11/20 Goal status: IN PROGRESS  3.  Demo modified independent bed mobility to reduce level of assist from caregivers  Baseline: moderate assist  Goal status: IN PROGRESS  ASSESSMENT:  CLINICAL IMPRESSION: Patient arrived to session without complaints.  Reviewed bed mobility technique from last session with cueing and min A required to get into bed, only supervision and cueing required for supine>sit. Reviewed standing PWR moves with cueing to improve sharpness and amplitude of movement and progressed with addition of TB resistance for increased effort. Patient did report of back pain after prolonged standing, but able to continue after short sit break. No complaints at end of session.   OBJECTIVE IMPAIRMENTS: Abnormal gait, decreased activity tolerance, decreased balance, decreased coordination, decreased mobility, difficulty walking, decreased ROM, decreased strength, impaired flexibility, impaired tone, improper body mechanics, postural dysfunction, and pain.   ACTIVITY LIMITATIONS: carrying, lifting, bending, standing, squatting, stairs, transfers, bed mobility, reach over head, and locomotion level  PARTICIPATION LIMITATIONS: meal prep, cleaning, laundry, interpersonal relationship, shopping, community activity, yard work, and hobbies of music  PERSONAL FACTORS: Age, Past/current  experiences, Time since onset of injury/illness/exacerbation, and 1-2 comorbidities: PMH  are also  affecting patient's functional outcome.   REHAB POTENTIAL: Good  CLINICAL DECISION MAKING: Evolving/moderate complexity  EVALUATION COMPLEXITY: Moderate  PLAN:  PT FREQUENCY: 2x/week  PT DURATION: 8 weeks  PLANNED INTERVENTIONS: 97110-Therapeutic exercises, 97530- Therapeutic activity, [id number]- Neuromuscular re-education, 97535- Self Care, [id number]- Manual therapy, [phone]- Gait training, [phone]- Orthotic Fit/training, [phone]- Canalith repositioning, [phone]- Aquatic Therapy, Patient/Family education, Balance training, Stair training, Taping, Dry Needling, Joint mobilization, Spinal mobilization, Vestibular training, DME instructions, Cryotherapy, and Moist heat  PLAN FOR NEXT SESSION: check STGs; Review bed mobility techniques with wife present; review standing PWR moves HEP, supine power moves and stretching    Anette Guarneri, PT, DPT 04/12/23 4:22 PM  [facility] at [name] [address] [place], [place] [address] Phone # [phone] Fax # [phone]

## 2023-04-12 ENCOUNTER — Ambulatory Visit: Payer: PPO

## 2023-04-12 ENCOUNTER — Ambulatory Visit: Payer: PPO | Admitting: Physical Therapy

## 2023-04-12 ENCOUNTER — Encounter: Payer: Self-pay | Admitting: Physical Therapy

## 2023-04-12 ENCOUNTER — Ambulatory Visit: Payer: PPO | Admitting: Occupational Therapy

## 2023-04-12 DIAGNOSIS — M6281 Muscle weakness (generalized): Secondary | ICD-10-CM

## 2023-04-12 DIAGNOSIS — R2681 Unsteadiness on feet: Secondary | ICD-10-CM

## 2023-04-12 DIAGNOSIS — R293 Abnormal posture: Secondary | ICD-10-CM

## 2023-04-12 DIAGNOSIS — R41841 Cognitive communication deficit: Secondary | ICD-10-CM

## 2023-04-12 DIAGNOSIS — R29898 Other symptoms and signs involving the musculoskeletal system: Secondary | ICD-10-CM

## 2023-04-12 DIAGNOSIS — R2689 Other abnormalities of gait and mobility: Secondary | ICD-10-CM

## 2023-04-12 DIAGNOSIS — R29818 Other symptoms and signs involving the nervous system: Secondary | ICD-10-CM

## 2023-04-12 DIAGNOSIS — G20A2 Parkinson's disease without dyskinesia, with fluctuations: Secondary | ICD-10-CM | POA: Diagnosis not present

## 2023-04-12 DIAGNOSIS — R471 Dysarthria and anarthria: Secondary | ICD-10-CM

## 2023-04-12 DIAGNOSIS — R1310 Dysphagia, unspecified: Secondary | ICD-10-CM

## 2023-04-12 DIAGNOSIS — R278 Other lack of coordination: Secondary | ICD-10-CM

## 2023-04-12 NOTE — Therapy (Signed)
OUTPATIENT OCCUPATIONAL THERAPY PARKINSON'S Treatment Note  Patient Name: Matthew [name] MRN: [id number] DOB:03-28-1951, 72 y.o., male Today's Date: 04/12/2023  PCP: Daisy Floro, MD REFERRING PROVIDER: Tat, Octaviano Batty, DO  END OF SESSION:  OT End of Session - 04/12/23 1457     Visit Number 4    Number of Visits 17    Date for OT Re-Evaluation 05/16/23    Authorization Type [employer]    OT Start Time 1450    OT Stop Time 1530    OT Time Calculation (min) 40 min                Past Medical History:  Diagnosis Date   Anxiety    Arthritis    GERD (gastroesophageal reflux disease)    occ remote history   Hypertension    Parkinson disease (HCC)    Prostate cancer (HCC)    Right knee pain    questionable meniscal tear   Seasonal allergies    Past Surgical History:  Procedure Laterality Date   COLONOSCOPY     Leg Laceration Repair  Left    Age 55   PELVIC LYMPH NODE DISSECTION Bilateral 03/22/2018   Procedure: PELVIC LYMPH NODE DISSECTION;  Surgeon: Rene Paci, MD;  Location: [facility];  Service: Urology;  Laterality: Bilateral;   PROSTATE BIOPSY     ROBOT ASSISTED LAPAROSCOPIC RADICAL PROSTATECTOMY N/A 03/22/2018   Procedure: XI ROBOTIC ASSISTED LAPAROSCOPIC PROSTATECTOMY;  Surgeon: Rene Paci, MD;  Location: [facility];  Service: Urology;  Laterality: N/A;   TOTAL KNEE ARTHROPLASTY Right 05/05/2019   Procedure: RIGHT TOTAL KNEE ARTHROPLASTY;  Surgeon: Gean Birchwood, MD;  Location: [facility];  Service: Orthopedics;  Laterality: Right;   Patient Active Problem List   Diagnosis Date Noted   S/P TKR (total knee replacement), right 05/05/2019   Osteoarthritis of right knee 05/02/2019   Prostate cancer (HCC) 03/22/2018   Malignant neoplasm of prostate (HCC) 02/27/2018    ONSET DATE: referral date 03/06/23  REFERRING DIAG: G20.A2 (ICD-10-CM) - Parkinson's disease without dyskinesia, with fluctuating manifestations  THERAPY  DIAG:  Other symptoms and signs involving the nervous system  Muscle weakness (generalized)  Abnormal posture  Other lack of coordination  Rationale for Evaluation and Treatment: Rehabilitation  SUBJECTIVE:   SUBJECTIVE STATEMENT: Pt reports feeling out of sorts this afternoon.  Reports eating lunch was difficult and took increased time as he was eating noodles and "always has a hard time" with noodles.   Pt accompanied by: self and significant other  PERTINENT HISTORY: PD, anxiety, arthritis, GERD, HTN, R TKR   PRECAUTIONS: Fall  WEIGHT BEARING RESTRICTIONS: No  PAIN:  Are you having pain? No  FALLS: Has patient fallen in last 6 months? Yes. Number of falls Pt reports "having a time where he fell frequently" but has fallen ~3 times in the last "few months" with most recent being Saturday.  LIVING ENVIRONMENT: Lives with: lives with their spouse Lives in: House/apartment Stairs:  Yes: Internal: full flight of step, but able to stay on main floor without need to navigate steps; and External: 2 steps; on right going up Has following equipment at home: Single point cane, Quad cane small base, Walker - 2 wheeled, Environmental consultant - 4 wheeled, Wheelchair (manual), shower chair, Grab bars, and transport chair - for dr appts  PLOF: Requires assistive device for independence and Needs assistance with ADLs  PATIENT GOALS: to be as close to normal as possible  OBJECTIVE:  Note: Objective measures  were completed at Evaluation unless otherwise noted.  HAND DOMINANCE: Right  ADLs: Overall ADLs: reports overall slowing, wife is "second pair of hands" Transfers/ambulation related to ADLs: utilizing small 4 wheeled walker for all mobility  Eating: wife assisting with cutting foods, pt spilling foods off utensil, constantly spilling things off the plate, having to use finger to stabilize food when scooping with R hand Grooming: utilizing electric toothbrush and water pick for oral care,  difficulty with brushing hair  UB Dressing: primarily wearing t-shirts/pull over shirts, occasional assist from wife if in a hurry or get stuck, no longer wearing button up shirts anymore.  Wife will assist with donning shirt if completing after shower LB Dressing: difficulty with reaching forward towards feet when donning pants, socks, shoes; increased difficulty when crossing leg , no longer wearing lace up shoes Toileting: requires momentum to stand up from toilet or will reach towards door for leverage/momentum, reports "struggle" with hygiene  Bathing: occasional assist for thoroughness when drying off  Tub Shower transfers: wife provides supervision, handle is just inside door and pt able to hold on to it while stepping over small ledge Equipment: Grab bars, Walk in shower, and Long handled sponge  IADLs: Spouse completes all shopping, meal prep, and housekeeping tasks. Community mobility: Relies on family or friends for transportation Medication management: pt fills pill box and is able to take correct dosages at correct time Financial management: wife completes and did prior Handwriting:  TBA  MOBILITY STATUS: Hx of falls and difficulty carrying objections with ambulation  POSTURE COMMENTS:  forward head  ACTIVITY TOLERANCE: Activity tolerance: diminished  FUNCTIONAL OUTCOME MEASURES: Physical performance test: PPT#2 (simulated eating) 25.91 & PPT#4 (donning/doffing jacket): TBA  COORDINATION: 9 Hole Peg test: Right: 1:36.03 sec; Left: 58.28 sec  UE ROM:   B shoulder flexion ~120 *, reports mild discomfort in R upper arm with internal rotation but able to achieve full ROM  UE MMT:    grossly 4/5 bilaterally  MUSCLE TONE: RUE: Mild and LUE: Mild  COGNITION: Overall cognitive status:  slower processing and speech  OBSERVATIONS: Bradykinesia   TODAY'S TREATMENT:                                                                                 DATE:  04/12/23 Large  amplitude: engaged in finger flicks with focus on full elbow and wrist extension and fingers open big.  Completed x10 with min cues for technique and amplitude.  Self-feeding: engaged in scooping simulated foods (rubber bands, dry beans, cut straws) with fork and spoon.  Trialed various techniques with use of built up handle on fork, use of spoon in L hand to provide buffer to aid in scooping, use of plate guard to provide ledge to aid in scooping, and tipping plate small angle to aid in scooping.  OT encouraged pt to trial various combinations of these techniques and recommended additional dishes/devices that may aid as well.  Provided with handout.   04/10/23 Large amplitude: pt with difficulty removing tissue from small tissue pack.  OT encouraged pt to complete finger flicks, therefore engaged in finger flicks with focus on large opening of hands and then returned to  returning extra tissue to pack with improved motor control. Bag exercises: attempted bag exercises with focus on large amplitude movements as needed for increased ease with UB dressing and drying back.  Pt demonstrating decreased shoulder flexion and abduction as well as difficulty tossing scarf over ipsilateral shoulder, especially on L side.   Large amplitude: engaged in reaching across midline and outside BOS with placing clothespins onto target and then placing cups onto elevated target to facilitate increased amplitude and reach. Cards: engaged in forearm supination and large hand opening when picking up cards and then placing with large amplitude supination to place onto stacks by color.  OT providing demonstration and verbal cues for large opening of hand and even stopping task to complete finger flicks as needed to further open hand.     04/03/23 Large amplitude: engaged in reaching with boom whackers to tap out to R and L to facilitate increased shoulder ROM and open chest.  Pt reports mild discomfort with RUE.  Increased  challenge with providing pattern with reaching up and down to scarves/targets on poles.  Incorporated crossing midline to facilitate increased reach and weight shifting.  Transitioned to tapping boom whackers over head  to facilitate increased shoulder flexion and abduction.   Pt maintains arms in front of body vs overhead due to decreased shoulder ROM/tightness.  Completed additional set of 5 with crossing midline and set of 5 when reaching out to side.  OT providing cues to open hand large when reaching without boom whackers.  Shoulder ROM: engaged in shoulder shrugs and scapular retraction with OT positioned behind pt and providing tactile cues for increased ROM.  Completed x5 each.   PATIENT EDUCATION: Education details: condition specific education, large amplitude exercises Person educated: Patient Education method: Explanation Education comprehension: needs further education  HOME EXERCISE PROGRAM: Access Code: [email or web address] URL: [email or web address] Date: 04/10/2023 Prepared by: [facility] - Outpatient  Rehab - [facility]  Exercises - Seated Shoulder Shrugs  - 2 x daily - 5 reps - Seated Scapular Retraction  - 2 x daily - 5 reps - Seated Finger Flicks with Elbow Extension  - 1 x daily - 2 sets - 5-10 reps - Seated Reaching to Side and Across Body  - 1 x daily - 2 sets - 5-10 reps - Seated Reaching to Side  - 1 x daily - 2 sets - 5-10 reps  GOALS: Goals reviewed with patient? No  SHORT TERM GOALS: Target date: 04/18/23  Pt will be Independent with PD specific HEP. Baseline: Goal status: IN PROGRESS  2.  Pt will demonstrate improved shoulder flexion bilaterally to retreive a lightweight object from overhead shelf with BUE at >/= 130 shoulder flexion.  Baseline:  Goal status: IN PROGRESS  3.  Pt will verbalize understanding of adapted strategies and DME/AE PRN (reacher, BSC, shower chair, rocker knife, scoop plate/plate guard, etc) to maximize safety and  independence with ADLs/IADLs.  Baseline:  Goal status: IN PROGRESS  4.  Pt will report understanding of modifications and adaptive strategies to improve handwriting legibility and ease. Baseline:  Goal status: IN PROGRESS   LONG TERM GOALS: Target date: 05/16/23  Pt will demonstrate increased functional ROM and use of BUE to aid in clothing management and hygiene with toileting tasks.  Baseline:  Goal status: IN PROGRESS  2.  Pt will demonstrate improved ability to get up from lower seat/toilet without assistance with DME PRN. Baseline:  Goal status: IN PROGRESS  3.  Pt will demonstrate improved fine  motor coordination for ADLs as evidenced by decreasing 9 hole peg test score for RUE by 10 secs on R and 1 or fewer drops. Baseline: R: 1:36 and L: 58 sec.  Pt reports dropping small items frequently Goal status: IN PROGRESS  4.  Pt will demonstrate improved ease with feeding as evidenced by decreasing PPT#2 (self feeding) by 5 secs  Baseline: 25.91 Goal status: IN PROGRESS  5.  Pt will demonstrate and/or report improved independence with LB dressing with use of AE/DME as needed.  Baseline:  Goal status: IN PROGRESS  ASSESSMENT:  CLINICAL IMPRESSION: Pt reports difficulty with self-feeding, reporting that it takes significantly increased time.  Therefore engaged in practice with various strategies and AE to aid as needed for improved ease with self-feeding.  Pt demonstrating improved control with use of built up handle as well as plate guard and/or spoon to aid in scooping.  PERFORMANCE DEFICITS: in functional skills including ADLs, IADLs, coordination, ROM, strength, flexibility, Fine motor control, Gross motor control, balance, body mechanics, endurance, decreased knowledge of precautions, decreased knowledge of use of DME, and UE functional use and psychosocial skills including coping strategies, environmental adaptation, and routines and behaviors.   IMPAIRMENTS: are limiting  patient from ADLs, IADLs, and rest and sleep.     PLAN:  OT FREQUENCY: 2x/week  OT DURATION: 8 weeks  PLANNED INTERVENTIONS: 97168 OT Re-evaluation, 97535 self care/ADL training, 82956 therapeutic exercise, 97530 therapeutic activity, 97112 neuromuscular re-education, 97035 ultrasound, 97010 moist heat, 97010 cryotherapy, functional mobility training, psychosocial skills training, energy conservation, coping strategies training, patient/family education, and DME and/or AE instructions  RECOMMENDED OTHER SERVICES: NA  CONSULTED AND AGREED WITH PLAN OF CARE: Patient  PLAN FOR NEXT SESSION: Review and add to large amplitude exercises, initiate bag exercises with focus on improved shoulder ROM, FMC tasks/HEP   Layaan Mott, OTR/L 04/12/2023, 3:00 PM   [facility] at [facility] [address] [place], [place] [address] Phone # [phone] Fax # [phone]

## 2023-04-12 NOTE — Therapy (Signed)
OUTPATIENT SPEECH LANGUAGE PATHOLOGY PARKINSON'S TREATMENT   Patient Name: Matthew [name] MRN: [id number] DOB:08-17-1950, 72 y.o., male Today's Date: 04/12/2023  PCP: Gildardo Cranker, MD REFERRING PROVIDER: Kerin Salen, DO  END OF SESSION:  End of Session - 04/12/23 1621     Visit Number 5    Number of Visits 17    Date for SLP Re-Evaluation 05/25/23    SLP Start Time 1617    SLP Stop Time  1700    SLP Time Calculation (min) 43 min    Activity Tolerance Patient tolerated treatment well             Past Medical History:  Diagnosis Date   Anxiety    Arthritis    GERD (gastroesophageal reflux disease)    occ remote history   Hypertension    Parkinson disease (HCC)    Prostate cancer (HCC)    Right knee pain    questionable meniscal tear   Seasonal allergies    Past Surgical History:  Procedure Laterality Date   COLONOSCOPY     Leg Laceration Repair  Left    Age 66   PELVIC LYMPH NODE DISSECTION Bilateral 03/22/2018   Procedure: PELVIC LYMPH NODE DISSECTION;  Surgeon: Rene Paci, MD;  Location: [facility];  Service: Urology;  Laterality: Bilateral;   PROSTATE BIOPSY     ROBOT ASSISTED LAPAROSCOPIC RADICAL PROSTATECTOMY N/A 03/22/2018   Procedure: XI ROBOTIC ASSISTED LAPAROSCOPIC PROSTATECTOMY;  Surgeon: Rene Paci, MD;  Location: [facility];  Service: Urology;  Laterality: N/A;   TOTAL KNEE ARTHROPLASTY Right 05/05/2019   Procedure: RIGHT TOTAL KNEE ARTHROPLASTY;  Surgeon: Gean Birchwood, MD;  Location: [facility];  Service: Orthopedics;  Laterality: Right;   Patient Active Problem List   Diagnosis Date Noted   S/P TKR (total knee replacement), right 05/05/2019   Osteoarthritis of right knee 05/02/2019   Prostate cancer (HCC) 03/22/2018   Malignant neoplasm of prostate (HCC) 02/27/2018    ONSET DATE: script dated 03-06-23  REFERRING DIAG: Parkinson's Disease  THERAPY DIAG:  Dysarthria and anarthria  Cognitive communication  deficit  Dysphagia, unspecified type  Rationale for Evaluation and Treatment: Rehabilitation  SUBJECTIVE:   SUBJECTIVE STATEMENT: "Doing good, Javae Braaten." (WNL volume) Pt accompanied by: self  PERTINENT HISTORY: Pt well-known to this SLP from previous courses of ST. MBS in August 2023- results below. Pt's last ST course resulted in pt partially meeting goal for producing speech in upper 60s dB in simple to mod complex with occasional min A.  PAIN:  Are you having pain? No  FALLS: Has patient fallen in last 6 months?  See PT evaluation for details  PATIENT GOALS: Improve loudness and fluency  OBJECTIVE:   DIAGNOSTIC FINDINGS:  MBS 12-22-21 Patient presents with functional oropharyngeal swallowing without aspiration of any consistency tested. He does demonstrate minimal asymptomatic laryngeal penetration of thin liquid due to decreased timing of laryngeal closure. Chin tuck posture with use of straw effective to prevent penetration. Pharyngeal swallow is strong without retention. Oral transiting discoordination noted with pt swallowing barium tablet as he required 2nd bolus of thin to transit through oropharynx. Upon esophageal sweep, pt appeared with barium tablet retained in esophagus with pt awareness. Tablet appeared to transit distally after several boluses of liquids. After tablet had transited, pt continued with "phantom" sensation to esophagus. Recommend pt continue diet as tolerated, using chin tuck with liquids if improves comfort, decreases cough. Also encouraged pt to assure maintains strength of voice, cough and expectoration for airway protection.  PATIENT REPORTED OUTCOME MEASURES (PROM): Communication Effectiveness Survey: pt returned CES on 04/12/23 with score of 18/40, with lower scores indicating more impact of pt's deficits on daily communication situations.   TODAY'S TREATMENT:                                                                                                                                          DATE:  04/12/23: Loud /a/ targeted today to make louder speech more habitual. Average loudness 88dB, with usual min A for loudness. Everyday sentences read with average loudness 82dB with usual mod cues for loudness and tactile cues of abdominal recruitment. In sentence responses pt req'd min-mod A for loudness, occasionally. Pt appeared fatigued from previous therapies today and told SLP affirmatively he was fatigued when SLP asked him. SLP provided homework for sentence responses. CES returned today - results above.   04/10/23: Loud /a/ targeted today to make louder speech more habitual. Average loudness 85dB. Everyday sentences read with average loudness 83dB. SLP engaged in conversational topics (3-4 minutes) x5; SLP req'd to provide min-mod cues, rarely - which increased to mod cues occasionally by last topic. Pt acknowledged he was fatigued from PT where they worked on bed mobility. Pt provided 2 sentence responses with average 65dB with rare min-mod A for loudness. Other multiple sentence responses were challenging today due to decr'd divided attention negatively impacting pt performance today more than previous sessions, likely due to fatigue factor. SLP encouraged pt to cont to practice at home. "I always practice with my grandkids because I don't want them to have to ask me to repeat like [name] does."  04/03/23: SLP engaged pt with loud /a/ to encourage muscle memory to make louder speech more habitual. Average loudness 85dB. Initial cues for loudness but pt independent after that. Everyday sentences were read with average 81dB.  Word level responses with higher cognitive load completed with average 70dB.  03/27/23: Pt arrived and greeted SLP with 70dB average, and then decr'd to a soft volume once he sat down in ST room (average 64dB). Initial cues for louder speech and tactile cue for feeling strength in his voice from abdomen, thinking about effort for  /a/.  SLP engaged pt in conversation for 38 minutes, with conversational segments of 8-13 minutes, x4. SLP req'd occasional mod cues for pt to improve loudness, faded to occasional min-mod cues. Pt demonstrated decr'd stamina approx 32 minutes into conversational segments. SLP reiterated the necessity to perform loud /a/ at home as prescribed when pt went to lobby to meet wife.   03/21/23 (eval): SPEECH: SLP explained eval results to pt. SLP ensured pt knew the importance of daily loud /a/ and everyday sentences practice BID.  SWALLOWING: Additionally, he and SLP practiced chin down posture with a straw and with a cup.  PATIENT EDUCATION: Education details: see "today's treatment" Person educated: Patient Education method: Explanation and Demonstration  Education comprehension: verbalized understanding  HOME EXERCISE PROGRAM: Loud /a/ and everyday sentences.    GOALS: Goals reviewed with patient? No  SHORT TERM GOALS: Target date: 04/20/23  Produce /a/ with average mid-upper 80s dB over three sessions Baseline: Goal status: Modified  2.  pt will demo abdominal breathing with sentence responses 85% accuracy x 3 sessions  Baseline:  Goal status: INITIAL  3.  pt will generate 2 minutes simple conversation with upper 60s dB average in 2 sessions  Baseline:  Goal status: Modified  4.  Pt will use strategies for dysfluency in 1-2 minute short conversational segments  Baseline:  Goal status: INITIAL   LONG TERM GOALS: Target date: 05/20/23  pt will produce average upper 60s dB in 10 minutes simple to mod complex conversation in 5 sessions  Baseline:  Goal status: INITIAL  2.  pt will maintain upper 60s dB average in 10 minute simple conversation x 3 sessions  Baseline:  Goal status: INITIAL  3.  pt will use abdominal breathing >80% of the time during 10 minute simple conversation in 3 sessions  Baseline:  Goal status: INITIAL  4.  pt will report fewer requests to repeat  himself than prior to ST in 3 sessions  Baseline:  Goal status: INITIAL  5.  Pt will use strategies for safer swallowing in 3 sessions with modified independence  Baseline:  Goal status: INITIAL  6.  Pt will score higher/better on PROM in the last 2 weeks of therapy Baseline:  Goal status: INITIAL  ASSESSMENT:  CLINICAL IMPRESSION: Patient is a 72 y.o. male who was seen today for treatment of dysarthria in light of Parkinsons Disease. See "today's treatment" for more details. Pt told SLP on eval date that family members ask him to repeat frequently, and he frequency of communicating in social situations has decr'd since last session of previous therapy course. Pt was able to improve loudness in simple conversation for 3 minutes and thus is an excellent candidate for ST.  Additionally when pt used chin tuck posture with liquids as told on MBS in August 2023, he felt safer.  OBJECTIVE IMPAIRMENTS: Objective impairments include executive functioning, aphasia, dysarthria, and dysphagia. These impairments are limiting patient from household responsibilities, ADLs/IADLs, effectively communicating at home and in community, and safety when swallowing.Factors affecting potential to achieve goals and functional outcome are  none .Marland Kitchen Patient will benefit from skilled SLP services to address above impairments and improve overall function.  REHAB POTENTIAL: Good  PLAN:  SLP FREQUENCY: 2x/week  SLP DURATION: 8 weeks  PLANNED INTERVENTIONS: Aspiration precaution training, Diet toleration management , Environmental controls, Cueing hierachy, Cognitive reorganization, Internal/external aids, Oral motor exercises, Functional tasks, Multimodal communication approach, SLP instruction and feedback, Compensatory strategies, and Patient/family education    [facility], CCC-SLP 04/12/2023, 4:38 PM

## 2023-04-12 NOTE — Patient Instructions (Signed)
       Try utilizing shallow bowl or plate with ledges to allow for increased ability to scoop food Try using back side of spoon to aid in scooping Tip bowl/plate to aid in scooping food Use built up handle to decrease need to grip too hard Pay attention to hand placement on utensil for increased grasp/control

## 2023-04-13 ENCOUNTER — Ambulatory Visit: Payer: PPO | Admitting: Neurology

## 2023-04-13 DIAGNOSIS — K117 Disturbances of salivary secretion: Secondary | ICD-10-CM

## 2023-04-13 MED ORDER — RIMABOTULINUMTOXINB 5000 UNIT/ML IM SOLN
5000.0000 [IU] | Freq: Once | INTRAMUSCULAR | Status: AC
Start: 2023-04-13 — End: 2023-04-13
  Administered 2023-04-13: 5000 [IU] via INTRAMUSCULAR

## 2023-04-13 NOTE — Procedures (Signed)
[facility]    History:  Diagnosis: Sialorrhea    Result History  Wears off early but helps  Consent obtained from: The patient The patient was educated on the botulinum toxin the black blox warning and given a copy of the botox patient medication guide.  The patient understands that this warning states that there have been reported cases of the Botox extending beyond the injection site and creating adverse effects, similar to those of botulism. This included loss of strength, trouble walking, hoarseness, trouble saying words clearly, loss of bladder control, trouble breathing, trouble swallowing, diplopia, blurry vision and ptosis. Most of the distant spread of Botox was happening in patients, primarily children, who received medication for spasticity or for cervical dystonia. The patient expressed understanding and desire to proceed.     Injections  Location Left  Right Units Number of sites  Submandibular gland 250 250 500 1 per side  Parotid 2250 2250 2500 1 per side  TOTAL UNITS:     5000      Type of Toxin: Myobloc type B As ordered and injected IM at today's visit Total Units: 5000  Discarded Units: 0  Needle drawback with each injection was free of blood. Pt tolerated procedure well without complications.   Reinjection is anticipated in 3 months.

## 2023-04-17 ENCOUNTER — Ambulatory Visit: Payer: PPO | Admitting: Occupational Therapy

## 2023-04-17 ENCOUNTER — Encounter: Payer: Self-pay | Admitting: Physical Therapy

## 2023-04-17 ENCOUNTER — Ambulatory Visit: Payer: PPO

## 2023-04-17 ENCOUNTER — Ambulatory Visit: Payer: PPO | Admitting: Physical Therapy

## 2023-04-17 DIAGNOSIS — R29818 Other symptoms and signs involving the nervous system: Secondary | ICD-10-CM

## 2023-04-17 DIAGNOSIS — R1310 Dysphagia, unspecified: Secondary | ICD-10-CM

## 2023-04-17 DIAGNOSIS — M6281 Muscle weakness (generalized): Secondary | ICD-10-CM

## 2023-04-17 DIAGNOSIS — G20A2 Parkinson's disease without dyskinesia, with fluctuations: Secondary | ICD-10-CM | POA: Diagnosis not present

## 2023-04-17 DIAGNOSIS — R278 Other lack of coordination: Secondary | ICD-10-CM

## 2023-04-17 DIAGNOSIS — R293 Abnormal posture: Secondary | ICD-10-CM

## 2023-04-17 DIAGNOSIS — R41841 Cognitive communication deficit: Secondary | ICD-10-CM

## 2023-04-17 DIAGNOSIS — R2681 Unsteadiness on feet: Secondary | ICD-10-CM

## 2023-04-17 DIAGNOSIS — R471 Dysarthria and anarthria: Secondary | ICD-10-CM

## 2023-04-17 NOTE — Therapy (Signed)
OUTPATIENT OCCUPATIONAL THERAPY PARKINSON'S Treatment Note  Patient Name: Matthew [name] MRN: [id number] DOB:03-22-1951, 72 y.o., male Today's Date: 04/17/2023  PCP: Daisy Floro, MD REFERRING PROVIDER: Tat, Octaviano Batty, DO  END OF SESSION:  OT End of Session - 04/17/23 1455     Visit Number 5    Number of Visits 17    Date for OT Re-Evaluation 05/16/23    Authorization Type [employer]    OT Start Time 1450    OT Stop Time 1533    OT Time Calculation (min) 43 min                 Past Medical History:  Diagnosis Date   Anxiety    Arthritis    GERD (gastroesophageal reflux disease)    occ remote history   Hypertension    Parkinson disease (HCC)    Prostate cancer (HCC)    Right knee pain    questionable meniscal tear   Seasonal allergies    Past Surgical History:  Procedure Laterality Date   COLONOSCOPY     Leg Laceration Repair  Left    Age 60   PELVIC LYMPH NODE DISSECTION Bilateral 03/22/2018   Procedure: PELVIC LYMPH NODE DISSECTION;  Surgeon: Rene Paci, MD;  Location: [facility];  Service: Urology;  Laterality: Bilateral;   PROSTATE BIOPSY     ROBOT ASSISTED LAPAROSCOPIC RADICAL PROSTATECTOMY N/A 03/22/2018   Procedure: XI ROBOTIC ASSISTED LAPAROSCOPIC PROSTATECTOMY;  Surgeon: Rene Paci, MD;  Location: [facility];  Service: Urology;  Laterality: N/A;   TOTAL KNEE ARTHROPLASTY Right 05/05/2019   Procedure: RIGHT TOTAL KNEE ARTHROPLASTY;  Surgeon: Gean Birchwood, MD;  Location: [facility];  Service: Orthopedics;  Laterality: Right;   Patient Active Problem List   Diagnosis Date Noted   S/P TKR (total knee replacement), right 05/05/2019   Osteoarthritis of right knee 05/02/2019   Prostate cancer (HCC) 03/22/2018   Malignant neoplasm of prostate (HCC) 02/27/2018    ONSET DATE: referral date 03/06/23  REFERRING DIAG: G20.A2 (ICD-10-CM) - Parkinson's disease without dyskinesia, with fluctuating manifestations  THERAPY  DIAG:  Other symptoms and signs involving the nervous system  Muscle weakness (generalized)  Other lack of coordination  Unsteadiness on feet  Rationale for Evaluation and Treatment: Rehabilitation  SUBJECTIVE:   SUBJECTIVE STATEMENT: Pt reports strategy with rocking a little bit in preparation to get up from edge of bed or out of recliner.   Pt accompanied by: self and significant other  PERTINENT HISTORY: PD, anxiety, arthritis, GERD, HTN, R TKR   PRECAUTIONS: Fall  WEIGHT BEARING RESTRICTIONS: No  PAIN:  Are you having pain? No  FALLS: Has patient fallen in last 6 months? Yes. Number of falls Pt reports "having a time where he fell frequently" but has fallen ~3 times in the last "few months" with most recent being Saturday.  LIVING ENVIRONMENT: Lives with: lives with their spouse Lives in: House/apartment Stairs:  Yes: Internal: full flight of step, but able to stay on main floor without need to navigate steps; and External: 2 steps; on right going up Has following equipment at home: Single point cane, Quad cane small base, Walker - 2 wheeled, Environmental consultant - 4 wheeled, Wheelchair (manual), shower chair, Grab bars, and transport chair - for dr appts  PLOF: Requires assistive device for independence and Needs assistance with ADLs  PATIENT GOALS: to be as close to normal as possible  OBJECTIVE:  Note: Objective measures were completed at Evaluation unless otherwise noted.  HAND DOMINANCE: Right  ADLs: Overall ADLs: reports overall slowing, wife is "second pair of hands" Transfers/ambulation related to ADLs: utilizing small 4 wheeled walker for all mobility  Eating: wife assisting with cutting foods, pt spilling foods off utensil, constantly spilling things off the plate, having to use finger to stabilize food when scooping with R hand Grooming: utilizing electric toothbrush and water pick for oral care, difficulty with brushing hair  UB Dressing: primarily wearing  t-shirts/pull over shirts, occasional assist from wife if in a hurry or get stuck, no longer wearing button up shirts anymore.  Wife will assist with donning shirt if completing after shower LB Dressing: difficulty with reaching forward towards feet when donning pants, socks, shoes; increased difficulty when crossing leg , no longer wearing lace up shoes Toileting: requires momentum to stand up from toilet or will reach towards door for leverage/momentum, reports "struggle" with hygiene  Bathing: occasional assist for thoroughness when drying off  Tub Shower transfers: wife provides supervision, handle is just inside door and pt able to hold on to it while stepping over small ledge Equipment: Grab bars, Walk in shower, and Long handled sponge  IADLs: Spouse completes all shopping, meal prep, and housekeeping tasks. Community mobility: Relies on family or friends for transportation Medication management: pt fills pill box and is able to take correct dosages at correct time Financial management: wife completes and did prior Handwriting:  TBA  MOBILITY STATUS: Hx of falls and difficulty carrying objections with ambulation  POSTURE COMMENTS:  forward head  ACTIVITY TOLERANCE: Activity tolerance: diminished  FUNCTIONAL OUTCOME MEASURES: Physical performance test: PPT#2 (simulated eating) 25.91 & PPT#4 (donning/doffing jacket): TBA  COORDINATION: 9 Hole Peg test: Right: 1:36.03 sec; Left: 58.28 sec  UE ROM:   B shoulder flexion ~120 *, reports mild discomfort in R upper arm with internal rotation but able to achieve full ROM  UE MMT:    grossly 4/5 bilaterally  MUSCLE TONE: RUE: Mild and LUE: Mild  COGNITION: Overall cognitive status:  slower processing and speech  OBSERVATIONS: Bradykinesia   TODAY'S TREATMENT:                                                                                 DATE:  04/17/23 Large amplitude: engaged in finger flicks with focus on full elbow and  wrist extension and fingers open big.  Completed x10 with min cues for technique and amplitude. Forearm supination/pronation x10 with cues for full supination, followed by thumb opposition with focus on large amplitude. Coordination: w/ [employer] UE including: flipping cards one at a time off a deck, picking up coins and placing into container, and picking up 4 coins 1 at a time and translating palm to fingertips to place into coin slot.  Pt completed coins with RUE and then LUE with improved ease with picking up coins one at a time during 2nd attempt with L.  Pt with difficulty with translation of coins from palm to finger tips bilaterally, L > R, therefore terminated task on L side after just one.  OT providing demonstration and cues for large amplitude and forearm supination when flipping and releasing cards with RUE.  Completed card flipping in  standing with CGA for standing balance.    04/12/23 Large amplitude: engaged in finger flicks with focus on full elbow and wrist extension and fingers open big.  Completed x10 with min cues for technique and amplitude.  Self-feeding: engaged in scooping simulated foods (rubber bands, dry beans, cut straws) with fork and spoon.  Trialed various techniques with use of built up handle on fork, use of spoon in L hand to provide buffer to aid in scooping, use of plate guard to provide ledge to aid in scooping, and tipping plate small angle to aid in scooping.  OT encouraged pt to trial various combinations of these techniques and recommended additional dishes/devices that may aid as well.  Provided with handout.   04/10/23 Large amplitude: pt with difficulty removing tissue from small tissue pack.  OT encouraged pt to complete finger flicks, therefore engaged in finger flicks with focus on large opening of hands and then returned to returning extra tissue to pack with improved motor control. Bag exercises: attempted bag exercises with focus on large amplitude movements  as needed for increased ease with UB dressing and drying back.  Pt demonstrating decreased shoulder flexion and abduction as well as difficulty tossing scarf over ipsilateral shoulder, especially on L side.   Large amplitude: engaged in reaching across midline and outside BOS with placing clothespins onto target and then placing cups onto elevated target to facilitate increased amplitude and reach. Cards: engaged in forearm supination and large hand opening when picking up cards and then placing with large amplitude supination to place onto stacks by color.  OT providing demonstration and verbal cues for large opening of hand and even stopping task to complete finger flicks as needed to further open hand.      PATIENT EDUCATION: Education details: condition specific education, large amplitude exercises Person educated: Patient Education method: Explanation Education comprehension: needs further education  HOME EXERCISE PROGRAM: Access Code: [email or web address] URL: [email or web address] Date: 04/10/2023 Prepared by: [facility] - Outpatient  Rehab - [facility]  Exercises - Seated Shoulder Shrugs  - 2 x daily - 5 reps - Seated Scapular Retraction  - 2 x daily - 5 reps - Seated Finger Flicks with Elbow Extension  - 1 x daily - 2 sets - 5-10 reps - Seated Reaching to Side and Across Body  - 1 x daily - 2 sets - 5-10 reps - Seated Reaching to Side  - 1 x daily - 2 sets - 5-10 reps  GOALS: Goals reviewed with patient? No  SHORT TERM GOALS: Target date: 04/18/23  Pt will be Independent with PD specific HEP. Baseline: Goal status: IN PROGRESS  2.  Pt will demonstrate improved shoulder flexion bilaterally to retreive a lightweight object from overhead shelf with BUE at >/= 130 shoulder flexion.  Baseline:  Goal status: IN PROGRESS  3.  Pt will verbalize understanding of adapted strategies and DME/AE PRN (reacher, BSC, shower chair, rocker knife, scoop plate/plate guard, etc) to  maximize safety and independence with ADLs/IADLs.  Baseline:  Goal status: IN PROGRESS  4.  Pt will report understanding of modifications and adaptive strategies to improve handwriting legibility and ease. Baseline:  Goal status: IN PROGRESS   LONG TERM GOALS: Target date: 05/16/23  Pt will demonstrate increased functional ROM and use of BUE to aid in clothing management and hygiene with toileting tasks.  Baseline:  Goal status: IN PROGRESS  2.  Pt will demonstrate improved ability to get up from lower seat/toilet without assistance with DME  PRN. Baseline:  Goal status: IN PROGRESS  3.  Pt will demonstrate improved fine motor coordination for ADLs as evidenced by decreasing 9 hole peg test score for RUE by 10 secs on R and 1 or fewer drops. Baseline: R: 1:36 and L: 58 sec.  Pt reports dropping small items frequently Goal status: IN PROGRESS  4.  Pt will demonstrate improved ease with feeding as evidenced by decreasing PPT#2 (self feeding) by 5 secs  Baseline: 25.91 Goal status: IN PROGRESS  5.  Pt will demonstrate and/or report improved independence with LB dressing with use of AE/DME as needed.  Baseline:  Goal status: IN PROGRESS  ASSESSMENT:  CLINICAL IMPRESSION: Pt requiring increased time and effort with coordination tasks, particularly with LUE.  Pt benefiting from cues for large amplitude and completion of coordination tasks and large amplitude finger flicks.  PERFORMANCE DEFICITS: in functional skills including ADLs, IADLs, coordination, ROM, strength, flexibility, Fine motor control, Gross motor control, balance, body mechanics, endurance, decreased knowledge of precautions, decreased knowledge of use of DME, and UE functional use and psychosocial skills including coping strategies, environmental adaptation, and routines and behaviors.   IMPAIRMENTS: are limiting patient from ADLs, IADLs, and rest and sleep.     PLAN:  OT FREQUENCY: 2x/week  OT DURATION: 8  weeks  PLANNED INTERVENTIONS: 97168 OT Re-evaluation, 97535 self care/ADL training, 60454 therapeutic exercise, 97530 therapeutic activity, 97112 neuromuscular re-education, 97035 ultrasound, 97010 moist heat, 97010 cryotherapy, functional mobility training, psychosocial skills training, energy conservation, coping strategies training, patient/family education, and DME and/or AE instructions  RECOMMENDED OTHER SERVICES: NA  CONSULTED AND AGREED WITH PLAN OF CARE: Patient  PLAN FOR NEXT SESSION: Review and add to large amplitude exercises, initiate bag exercises with focus on improved shoulder ROM, FMC tasks/HEP   Nimai Burbach, OTR/L 04/17/2023, 4:20 PM   [facility] at [facility] [address] [place], [place] [address] Phone # [phone] Fax # [phone]

## 2023-04-17 NOTE — Therapy (Signed)
OUTPATIENT PHYSICAL THERAPY NEURO TREATMENT   Patient Name: Matthew [name] MRN: [id number] DOB:July 29, 1950, 72 y.o., male Today's Date: 04/17/2023   PCP: Daisy Floro, MD REFERRING PROVIDER: Tat, Octaviano Batty, DO  END OF SESSION:  PT End of Session - 04/17/23 1413     Visit Number 6    Number of Visits 17    Date for PT Re-Evaluation 05/16/23    Authorization Type [employer]    Progress Note Due on Visit 10    PT Start Time 1606    PT Stop Time 1645    PT Time Calculation (min) 39 min    Equipment Utilized During Treatment --    Activity Tolerance Patient tolerated treatment well    Behavior During Therapy WFL for tasks assessed/performed                 Past Medical History:  Diagnosis Date   Anxiety    Arthritis    GERD (gastroesophageal reflux disease)    occ remote history   Hypertension    Parkinson disease (HCC)    Prostate cancer (HCC)    Right knee pain    questionable meniscal tear   Seasonal allergies    Past Surgical History:  Procedure Laterality Date   COLONOSCOPY     Leg Laceration Repair  Left    Age 27   PELVIC LYMPH NODE DISSECTION Bilateral 03/22/2018   Procedure: PELVIC LYMPH NODE DISSECTION;  Surgeon: Rene Paci, MD;  Location: [facility];  Service: Urology;  Laterality: Bilateral;   PROSTATE BIOPSY     ROBOT ASSISTED LAPAROSCOPIC RADICAL PROSTATECTOMY N/A 03/22/2018   Procedure: XI ROBOTIC ASSISTED LAPAROSCOPIC PROSTATECTOMY;  Surgeon: Rene Paci, MD;  Location: [facility];  Service: Urology;  Laterality: N/A;   TOTAL KNEE ARTHROPLASTY Right 05/05/2019   Procedure: RIGHT TOTAL KNEE ARTHROPLASTY;  Surgeon: Gean Birchwood, MD;  Location: [facility];  Service: Orthopedics;  Laterality: Right;   Patient Active Problem List   Diagnosis Date Noted   S/P TKR (total knee replacement), right 05/05/2019   Osteoarthritis of right knee 05/02/2019   Prostate cancer (HCC) 03/22/2018   Malignant neoplasm of prostate  (HCC) 02/27/2018    ONSET DATE: PD 2021  REFERRING DIAG: G20.A2 (ICD-10-CM) - Parkinson's disease without dyskinesia, with fluctuating manifestations (HCC)   THERAPY DIAG:  Other symptoms and signs involving the nervous system  Muscle weakness (generalized)  Abnormal posture  Rationale for Evaluation and Treatment: Rehabilitation  SUBJECTIVE:  SUBJECTIVE STATEMENT: Had a hard time getting out of family's corvette.  Almost fell out of it.  Also have a hard time getting out of my recliner. (Bottom is lower than my feet in it and it swivels)  Pt accompanied by: self, wife  PERTINENT HISTORY: PD, R TKR, prostate CA, HTN  PAIN:  Are you having pain? Yes: NPRS scale: 0/10 Pain location: upper back/lower back "it moves" Bilateral shoulders intermittently painful Pain description: hard to describe Aggravating factors: certain movements/times Relieving factors: rest  PRECAUTIONS: Fall  RED FLAGS: None   WEIGHT BEARING RESTRICTIONS: No  FALLS: Has patient fallen in last 6 months? Yes. Number of falls "several"  LIVING ENVIRONMENT: Lives with: lives with their family Lives in: House/apartment Stairs: Yes, 2-3 steps to enter Has following equipment at home: Dan Humphreys - 4 wheeled and hospital bed  PLOF: Needs assistance with ADLs, Needs assistance with transfers, and bed mobility  PATIENT GOALS: improve balance, mobility, strength. "Get as normal as I can". Be able to step up into son's Jeep  OBJECTIVE:    TODAY'S TREATMENT: 04/17/2023 Activity Comments  NuStep, Level 4>5, 4 extremities x 8 minutes 30 sec bouts >90   TUG:  29.53 sec   FTSTS:  15.41 sec Improved from 23 seconds  Sit to stand 10 reps, arms crossed at chest Cues for quad/glut activation upon standing  Reviewed pt's HEP Pt  demo standing PWR! Moves-good demo, discussed ways to increase intensity  LAQ 2 x 10 March 2 x 10 Step out and in, 2 x 10 3#  Practiced from simulated higher/squishier seat, sitting on balance disk, 2 x 5 reps, cues to rock back BIG, then forward stomp and push through legs to stand up Simulating recliner     TODAY'S TREATMENT: 04/12/23  Used the following sequencing to review bed mobility technique from previous session: "To get into the bed:  -Sit at the bed as close to the rail as you can-SCOOT  BACK as FAR as you can  -[name] Side to side to get momentum-then [name] 1 (BIG), 2 (BIGGER), 3 (BIGGEST) to lie down and swing your feet up onto the bed (required min A for LE management) -STEP and SCOOT to get in position in the bed    To get out of the bed:  -Bend your knees, then [name] your knees side to side to loosen up your legs (performing lower trunk rotation) -[name] knees to the side with REACHING your left arm up and across toward the bed rail, [name] 1 (BIG), 2 (BIGGER), 3 (BIGGEST) towards R side, then swing feet off the bed, PUSH up through your right elbow to sit up on the side of the bed (Pt required supervision and cueing only)    Activity Comments  Nustep L5 x 6 min UEs/LEs (dropped to L4 d/t pt unable to reach goal pace) Cues to maintain at least 80 SPM (pt had difficulty reaching this speed)  standing PWR moves Up 2x10 [name] 2x10 twist 10x Step 10x  In II bars. Small movements which start to run together. Limited weight shift. *performed a 2nd set of PWR up and rock holding red TB in hands for increased effort.        HOME EXERCISE PROGRAM Last updated: 03/27/23 Supine PWR! Moves 10x each daily   Access Code: [email or web address] URL: [email or web address] Date: 04/03/2023 Prepared by: [facility] - Outpatient  Rehab - [facility]  Exercises - Supine Hamstring Stretch with Strap  - 1 x daily - 5  x weekly - 2 sets - 30 sec hold - Supine Piriformis Stretch  with Foot on Ground  - 1 x daily - 5 x weekly - 2 sets - 30 sec hold - Supine Figure 4 Piriformis Stretch  - 1 x daily - 5 x weekly - 2 sets - 30 sec hold    Note: Objective measures were completed at Evaluation unless otherwise noted.  DIAGNOSTIC FINDINGS: n/a for this episode  COGNITION: Overall cognitive status:  notes increased issues with memory   SENSATION: Not tested, denies parasthesias  COORDINATION: Deficits with rapid alternating movements Finger to nose intact    MUSCLE TONE: BLE rigidity  MUSCLE LENGTH: Hamstrings: Right -30 deg; Left -30 deg   DTRs:  NT  POSTURE: rounded shoulders, forward head, increased thoracic kyphosis, and flexed trunk   LOWER EXTREMITY ROM:     Active  Right Eval Left Eval  Hip flexion    Hip extension    Hip abduction    Hip adduction    Hip internal rotation    Hip external rotation    Knee flexion    Knee extension -30 -30  Ankle dorsiflexion 10 10  Ankle plantarflexion    Ankle inversion    Ankle eversion     (Blank rows = not tested)  LOWER EXTREMITY MMT:    BLE 4/5 gross strength  BED MOBILITY:  Moderate assist  TRANSFERS: Assistive device utilized: Environmental consultant - 4 wheeled  Sit to stand: Complete Independence and Modified independence Stand to sit: Complete Independence Chair to chair: Complete Independence and Modified independence Floor:  NT    CURB:  Level of Assistance: CGA Assistive device utilized: Environmental consultant - 4 wheeled Curb Comments:   STAIRS: Reports stairs to 2nd floor but does not use 2-3 stairs to enter home  GAIT: Gait pattern: knee flexed in stance- Right and knee flexed in stance- Left Distance walked:  Assistive device utilized: Environmental consultant - 4 wheeled Level of assistance: Modified independence and SBA Comments:   FUNCTIONAL TESTS:  Mini-BESTest  11/28  Timed Up and Go test:26 sec with 4WW (compared to 15 sec)   10 meter walk test:15.31 sec = 2.14 ft/sec  (compared to 2.01 ft/sec)    5 time sit to stand test: 23 sec-(compared to 11 sec)    PATIENT EDUCATION: Education details: assessment details, rationale of intervention Person educated: Patient Education method: Explanation Education comprehension: verbalized understanding  HOME EXERCISE PROGRAM: TBD-reports daily performance 20 reps standing PWR! moves  GOALS: Goals reviewed with patient? Yes  SHORT TERM GOALS: Target date: 04/18/2023    Patient will be independent in HEP to improve functional outcomes Baseline: pt demo standing PWR! Moves + sit to stand Goal status: MET 04/17/2023  2.  Demo improved BLE strength and balance per time 15 sec 5xSTS test Baseline: 23>15.41 sec 04/17/2023 Goal status: MET 04/17/2023  3.  Demo improved safety with ambulation per time 15 sec TUG test Baseline: 34.53 sec>29.53 sec Goal status: IN PROGRESS    LONG TERM GOALS: Target date: 05/16/2023    Improve hamstring flexibility to -15 degrees to reduce rigidity and enable greater knee extension ROM for mobility Baseline: -30 Goal status: IN PROGRESS  2.  Reduce risk for falls per score 20/28 Mini-BESTest Baseline: 11/20 Goal status: IN PROGRESS  3.  Demo modified independent bed mobility to reduce level of assist from caregivers  Baseline: moderate assist  Goal status: IN PROGRESS  ASSESSMENT:  CLINICAL IMPRESSION: Assessed STGs with pt meeting STG 1  and 2.  STG 3 not met, with pt taking longer with TUG today, compared to eval.  Pt does have some difficulty with turns, which likely slowed pt with TUG score.  Worked on functional strengthening for remainder of session for improved BLE strength and amplitude to achieve improved sit to stand from varied heights.  With cues for increased backward and forward lean, as well as cues to push through BLEs to stand, he is able to perform sit to stand successfully from simulated recliner seat.  He will continue to benefit from skilled PT towards goals for improved  functional mobility and strength as well as decreased fall risk.  OBJECTIVE IMPAIRMENTS: Abnormal gait, decreased activity tolerance, decreased balance, decreased coordination, decreased mobility, difficulty walking, decreased ROM, decreased strength, impaired flexibility, impaired tone, improper body mechanics, postural dysfunction, and pain.   ACTIVITY LIMITATIONS: carrying, lifting, bending, standing, squatting, stairs, transfers, bed mobility, reach over head, and locomotion level  PARTICIPATION LIMITATIONS: meal prep, cleaning, laundry, interpersonal relationship, shopping, community activity, yard work, and hobbies of music  PERSONAL FACTORS: Age, Past/current experiences, Time since onset of injury/illness/exacerbation, and 1-2 comorbidities: PMH  are also affecting patient's functional outcome.   REHAB POTENTIAL: Good  CLINICAL DECISION MAKING: Evolving/moderate complexity  EVALUATION COMPLEXITY: Moderate  PLAN:  PT FREQUENCY: 2x/week  PT DURATION: 8 weeks  PLANNED INTERVENTIONS: 97110-Therapeutic exercises, 97530- Therapeutic activity, [id number]- Neuromuscular re-education, 97535- Self Care, 21308- Manual therapy, [phone]- Gait training, [phone]- Orthotic Fit/training, [phone]- Canalith repositioning, [phone]- Aquatic Therapy, Patient/Family education, Balance training, Stair training, Taping, Dry Needling, Joint mobilization, Spinal mobilization, Vestibular training, DME instructions, Cryotherapy, and Moist heat  PLAN FOR NEXT SESSION: Seated leg strengthening, sit to stand from varied surfaces.  Review bed mobility techniques with wife present; review standing PWR moves HEP, supine power moves and stretching    Lonia Blood, PT 04/17/23 3:05 PM Phone: [phone] Fax: [phone]  [facility] at [facility] [address] [place], [place] [address] Phone # [phone] Fax # [phone]

## 2023-04-17 NOTE — Therapy (Signed)
OUTPATIENT SPEECH LANGUAGE PATHOLOGY [facility]   Patient Name: Matthew [name] MRN: [id number] DOB:01-Aug-1950, 72 y.o., male Today's Date: 04/17/2023  PCP: Gildardo Cranker, MD REFERRING PROVIDER: Kerin Salen, DO  END OF SESSION:  End of Session - 04/17/23 1640     Visit Number 6    Number of Visits 17    Date for SLP Re-Evaluation 05/25/23    SLP Start Time 1533    SLP Stop Time  1615    SLP Time Calculation (min) 42 min    Activity Tolerance Patient tolerated treatment well             Past Medical History:  Diagnosis Date   Anxiety    Arthritis    GERD (gastroesophageal reflux disease)    occ remote history   Hypertension    Parkinson disease (HCC)    Prostate cancer (HCC)    Right knee pain    questionable meniscal tear   Seasonal allergies    Past Surgical History:  Procedure Laterality Date   COLONOSCOPY     Leg Laceration Repair  Left    Age 69   PELVIC LYMPH NODE DISSECTION Bilateral 03/22/2018   Procedure: PELVIC LYMPH NODE DISSECTION;  Surgeon: Rene Paci, MD;  Location: [facility];  Service: Urology;  Laterality: Bilateral;   PROSTATE BIOPSY     ROBOT ASSISTED LAPAROSCOPIC RADICAL PROSTATECTOMY N/A 03/22/2018   Procedure: XI ROBOTIC ASSISTED LAPAROSCOPIC PROSTATECTOMY;  Surgeon: Rene Paci, MD;  Location: [facility];  Service: Urology;  Laterality: N/A;   TOTAL KNEE ARTHROPLASTY Right 05/05/2019   Procedure: RIGHT TOTAL KNEE ARTHROPLASTY;  Surgeon: Gean Birchwood, MD;  Location: [facility];  Service: Orthopedics;  Laterality: Right;   Patient Active Problem List   Diagnosis Date Noted   S/P TKR (total knee replacement), right 05/05/2019   Osteoarthritis of right knee 05/02/2019   Prostate cancer (HCC) 03/22/2018   Malignant neoplasm of prostate (HCC) 02/27/2018    ONSET DATE: script dated 03-06-23  REFERRING DIAG: Parkinson's Disease  THERAPY DIAG:  Dysarthria and anarthria  Cognitive communication  deficit  Dysphagia, unspecified type  Rationale for Evaluation and Treatment: Rehabilitation  SUBJECTIVE:   SUBJECTIVE STATEMENT: "It's not working quite yet." (Pt, re: botox for excess saliva) Pt accompanied by: self  PERTINENT HISTORY: Pt well-known to this SLP from previous courses of ST. MBS in August 2023- results below. Pt's last ST course resulted in pt partially meeting goal for producing speech in upper 60s dB in simple to mod complex with occasional min A.  PAIN:  Are you having pain? No  FALLS: Has patient fallen in last 6 months?  See PT evaluation for details  PATIENT GOALS: Improve loudness and fluency  OBJECTIVE:   DIAGNOSTIC FINDINGS:  MBS 12-22-21 Patient presents with functional oropharyngeal swallowing without aspiration of any consistency tested. He does demonstrate minimal asymptomatic laryngeal penetration of thin liquid due to decreased timing of laryngeal closure. Chin tuck posture with use of straw effective to prevent penetration. Pharyngeal swallow is strong without retention. Oral transiting discoordination noted with pt swallowing barium tablet as he required 2nd bolus of thin to transit through oropharynx. Upon esophageal sweep, pt appeared with barium tablet retained in esophagus with pt awareness. Tablet appeared to transit distally after several boluses of liquids. After tablet had transited, pt continued with "phantom" sensation to esophagus. Recommend pt continue diet as tolerated, using chin tuck with liquids if improves comfort, decreases cough. Also encouraged pt to assure maintains strength of voice,  cough and expectoration for airway protection.    PATIENT REPORTED OUTCOME MEASURES (PROM): Communication Effectiveness Survey: pt returned CES on 04/12/23 with score of 18/40, with lower scores indicating more impact of pt's deficits on daily communication situations.   TODAY'S TREATMENT:                                                                                                                                          DATE:  04/17/23: Pt states family is asking him less frequently for repeats. Today in short conversation segments of 3 minutes, pt maintained upper 60s dB average for 4 segments and then req'd incr'ing level of cueing to occasional min-mod A by 7th segment. SLP encouraged pt to cont to practice at home.   04/12/23: Loud /a/ targeted today to make louder speech more habitual. Average loudness 88dB, with usual min A for loudness. Everyday sentences read with average loudness 82dB with usual mod cues for loudness and tactile cues of abdominal recruitment. In sentence responses pt req'd min-mod A for loudness, occasionally. Pt appeared fatigued from previous therapies today and told SLP affirmatively he was fatigued when SLP asked him. SLP provided homework for sentence responses. CES returned today - results above.   04/10/23: Loud /a/ targeted today to make louder speech more habitual. Average loudness 85dB. Everyday sentences read with average loudness 83dB. SLP engaged in conversational topics (3-4 minutes) x5; SLP req'd to provide min-mod cues, rarely - which increased to mod cues occasionally by last topic. Pt acknowledged he was fatigued from PT where they worked on bed mobility. Pt provided 2 sentence responses with average 65dB with rare min-mod A for loudness. Other multiple sentence responses were challenging today due to decr'd divided attention negatively impacting pt performance today more than previous sessions, likely due to fatigue factor. SLP encouraged pt to cont to practice at home. "I always practice with my grandkids because I don't want them to have to ask me to repeat like [name] does."  04/03/23: SLP engaged pt with loud /a/ to encourage muscle memory to make louder speech more habitual. Average loudness 85dB. Initial cues for loudness but pt independent after that. Everyday sentences were read with average 81dB.   Word level responses with higher cognitive load completed with average 70dB.  03/27/23: Pt arrived and greeted SLP with 70dB average, and then decr'd to a soft volume once he sat down in ST room (average 64dB). Initial cues for louder speech and tactile cue for feeling strength in his voice from abdomen, thinking about effort for /a/.  SLP engaged pt in conversation for 38 minutes, with conversational segments of 8-13 minutes, x4. SLP req'd occasional mod cues for pt to improve loudness, faded to occasional min-mod cues. Pt demonstrated decr'd stamina approx 32 minutes into conversational segments. SLP reiterated the necessity to perform loud /a/ at home as prescribed when pt went to lobby  to meet wife.   03/21/23 (eval): SPEECH: SLP explained eval results to pt. SLP ensured pt knew the importance of daily loud /a/ and everyday sentences practice BID.  SWALLOWING: Additionally, he and SLP practiced chin down posture with a straw and with a cup.  PATIENT EDUCATION: Education details: see "today's treatment" Person educated: Patient Education method: Explanation and Demonstration Education comprehension: verbalized understanding  HOME EXERCISE PROGRAM: Loud /a/ and everyday sentences.    GOALS: Goals reviewed with patient? No  SHORT TERM GOALS: Target date: 04/20/23  Produce /a/ with average mid-upper 80s dB over three sessions Baseline: 04/12/23 Goal status: Partially met  2.  pt will demo abdominal breathing with sentence responses 85% accuracy x 3 sessions  Baseline:  Goal status: not addressed yet  3.  pt will generate 2 minutes simple conversation with upper 60s dB average in 2 sessions  Baseline: 04/17/23 Goal status: Partially met  4.  Pt will use strategies for dysfluency in 1-2 minute short conversational segments  Baseline:  Goal status: deferred - dysfluency not heard in sessions thus far   LONG TERM GOALS: Target date: 05/20/23  pt will produce average upper 60s  dB in 10 minutes simple to mod complex conversation in 5 sessions  Baseline:  Goal status: INITIAL  2.  pt will maintain upper 60s dB average in 10 minute simple conversation x 3 sessions  Baseline:  Goal status: INITIAL  3.  pt will use abdominal breathing >80% of the time during 10 minute simple conversation in 3 sessions  Baseline:  Goal status: INITIAL  4.  pt will report fewer requests to repeat himself than prior to ST in 3 sessions  Baseline:  Goal status: INITIAL  5.  Pt will use strategies for safer swallowing in 3 sessions with modified independence  Baseline:  Goal status: INITIAL  6.  Pt will score higher/better on PROM in the last 2 weeks of therapy Baseline:  Goal status: INITIAL  ASSESSMENT:  CLINICAL IMPRESSION: One STG partially met. One STG deferred due to pt not exhibiting targeted deficit. Patient is a 72 y.o. male who was seen today for treatment of dysarthria in light of Parkinsons Disease. See "today's treatment" for more details. Pt told SLP on eval date that family members ask him to repeat frequently, and he frequency of communicating in social situations has decr'd since last session of previous therapy course. Pt was able to improve loudness in simple conversation for 3 minutes and thus is an excellent candidate for ST.  Additionally when pt used chin tuck posture with liquids as told on MBS in August 2023, he felt safer.  OBJECTIVE IMPAIRMENTS: Objective impairments include executive functioning, aphasia, dysarthria, and dysphagia. These impairments are limiting patient from household responsibilities, ADLs/IADLs, effectively communicating at home and in community, and safety when swallowing.Factors affecting potential to achieve goals and functional outcome are  none .Marland Kitchen Patient will benefit from skilled SLP services to address above impairments and improve overall function.  REHAB POTENTIAL: Good  PLAN:  SLP FREQUENCY: 2x/week  SLP DURATION: 8  weeks  PLANNED INTERVENTIONS: Aspiration precaution training, Diet toleration management , Environmental controls, Cueing hierachy, Cognitive reorganization, Internal/external aids, Oral motor exercises, Functional tasks, Multimodal communication approach, SLP instruction and feedback, Compensatory strategies, and Patient/family education    [facility] CCC-SLP 04/17/2023, 4:40 PM

## 2023-04-24 ENCOUNTER — Ambulatory Visit: Payer: PPO | Attending: Neurology

## 2023-04-24 ENCOUNTER — Ambulatory Visit: Payer: PPO

## 2023-04-24 ENCOUNTER — Ambulatory Visit: Payer: PPO | Admitting: Occupational Therapy

## 2023-04-24 DIAGNOSIS — R293 Abnormal posture: Secondary | ICD-10-CM

## 2023-04-24 DIAGNOSIS — G20A2 Parkinson's disease without dyskinesia, with fluctuations: Secondary | ICD-10-CM

## 2023-04-24 DIAGNOSIS — R1310 Dysphagia, unspecified: Secondary | ICD-10-CM | POA: Insufficient documentation

## 2023-04-24 DIAGNOSIS — R41841 Cognitive communication deficit: Secondary | ICD-10-CM | POA: Insufficient documentation

## 2023-04-24 DIAGNOSIS — R4701 Aphasia: Secondary | ICD-10-CM | POA: Insufficient documentation

## 2023-04-24 DIAGNOSIS — M6281 Muscle weakness (generalized): Secondary | ICD-10-CM | POA: Insufficient documentation

## 2023-04-24 DIAGNOSIS — R29898 Other symptoms and signs involving the musculoskeletal system: Secondary | ICD-10-CM | POA: Insufficient documentation

## 2023-04-24 DIAGNOSIS — R2689 Other abnormalities of gait and mobility: Secondary | ICD-10-CM | POA: Insufficient documentation

## 2023-04-24 DIAGNOSIS — R29818 Other symptoms and signs involving the nervous system: Secondary | ICD-10-CM | POA: Diagnosis not present

## 2023-04-24 DIAGNOSIS — R471 Dysarthria and anarthria: Secondary | ICD-10-CM | POA: Insufficient documentation

## 2023-04-24 DIAGNOSIS — R278 Other lack of coordination: Secondary | ICD-10-CM | POA: Diagnosis not present

## 2023-04-24 DIAGNOSIS — R2681 Unsteadiness on feet: Secondary | ICD-10-CM | POA: Insufficient documentation

## 2023-04-24 NOTE — Therapy (Signed)
OUTPATIENT SPEECH LANGUAGE PATHOLOGY [facility]   Patient Name: Matthew [name] MRN: [id number] DOB:1950-06-07, 72 y.o., male Today's Date: 04/24/2023  PCP: Gildardo Cranker, MD REFERRING PROVIDER: Kerin Salen, DO  END OF SESSION:  End of Session - 04/24/23 1432     Visit Number 7    Number of Visits 17    Date for SLP Re-Evaluation 05/25/23    SLP Start Time 1405    SLP Stop Time  1445    SLP Time Calculation (min) 40 min    Activity Tolerance Patient tolerated treatment well             Past Medical History:  Diagnosis Date   Anxiety    Arthritis    GERD (gastroesophageal reflux disease)    occ remote history   Hypertension    Parkinson disease (HCC)    Prostate cancer (HCC)    Right knee pain    questionable meniscal tear   Seasonal allergies    Past Surgical History:  Procedure Laterality Date   COLONOSCOPY     Leg Laceration Repair  Left    Age 50   PELVIC LYMPH NODE DISSECTION Bilateral 03/22/2018   Procedure: PELVIC LYMPH NODE DISSECTION;  Surgeon: Rene Paci, MD;  Location: [facility];  Service: Urology;  Laterality: Bilateral;   PROSTATE BIOPSY     ROBOT ASSISTED LAPAROSCOPIC RADICAL PROSTATECTOMY N/A 03/22/2018   Procedure: XI ROBOTIC ASSISTED LAPAROSCOPIC PROSTATECTOMY;  Surgeon: Rene Paci, MD;  Location: [facility];  Service: Urology;  Laterality: N/A;   TOTAL KNEE ARTHROPLASTY Right 05/05/2019   Procedure: RIGHT TOTAL KNEE ARTHROPLASTY;  Surgeon: Gean Birchwood, MD;  Location: [facility];  Service: Orthopedics;  Laterality: Right;   Patient Active Problem List   Diagnosis Date Noted   S/P TKR (total knee replacement), right 05/05/2019   Osteoarthritis of right knee 05/02/2019   Prostate cancer (HCC) 03/22/2018   Malignant neoplasm of prostate (HCC) 02/27/2018    ONSET DATE: script dated 03-06-23  REFERRING DIAG: Parkinson's Disease  THERAPY DIAG:  Dysarthria and anarthria  Cognitive communication  deficit  Dysphagia, unspecified type  Rationale for Evaluation and Treatment: Rehabilitation  SUBJECTIVE:   SUBJECTIVE STATEMENT: "It's not working quite yet." (Pt, re: botox for excess saliva) Pt accompanied by: self  PERTINENT HISTORY: Pt well-known to this SLP from previous courses of ST. MBS in August 2023- results below. Pt's last ST course resulted in pt partially meeting goal for producing speech in upper 60s dB in simple to mod complex with occasional min A.  PAIN:  Are you having pain? No  FALLS: Has patient fallen in last 6 months?  See PT evaluation for details  PATIENT GOALS: Improve loudness and fluency  OBJECTIVE:   DIAGNOSTIC FINDINGS:  MBS 12-22-21 Patient presents with functional oropharyngeal swallowing without aspiration of any consistency tested. He does demonstrate minimal asymptomatic laryngeal penetration of thin liquid due to decreased timing of laryngeal closure. Chin tuck posture with use of straw effective to prevent penetration. Pharyngeal swallow is strong without retention. Oral transiting discoordination noted with pt swallowing barium tablet as he required 2nd bolus of thin to transit through oropharynx. Upon esophageal sweep, pt appeared with barium tablet retained in esophagus with pt awareness. Tablet appeared to transit distally after several boluses of liquids. After tablet had transited, pt continued with "phantom" sensation to esophagus. Recommend pt continue diet as tolerated, using chin tuck with liquids if improves comfort, decreases cough. Also encouraged pt to assure maintains strength of voice,  cough and expectoration for airway protection.    PATIENT REPORTED OUTCOME MEASURES (PROM): Communication Effectiveness Survey: pt returned CES on 04/12/23 with score of 18/40, with lower scores indicating more impact of pt's deficits on daily communication situations.   TODAY'S TREATMENT:                                                                                                                                          DATE:  04/24/23: Pt taking his PD meds just prior to ST. Pt states he performed loud /a/ and sentences 5/7 days. Loud /a/ targeted today to make louder speech more habitual. Average loudness 85dB, with rare incr'd to occasional min A for loudness. Everyday sentences read with average loudness 80dB with occasional min-mod cues for loudness. Pt req'd usual min-mod cues for loudness in 3-4 minute conversational segments. SLP encouraged pt to cont to practice consistently.   04/17/23: Pt states family is asking him less frequently for repeats. Today in short conversation segments of 3 minutes, pt maintained upper 60s dB average for 4 segments and then req'd incr'ing level of cueing to occasional min-mod A by 7th segment. SLP encouraged pt to cont to practice at home.   04/12/23: Loud /a/ targeted today to make louder speech more habitual. Average loudness 88dB, with usual min A for loudness. Everyday sentences read with average loudness 82dB with usual mod cues for loudness and tactile cues of abdominal recruitment. In sentence responses pt req'd min-mod A for loudness, occasionally. Pt appeared fatigued from previous therapies today and told SLP affirmatively he was fatigued when SLP asked him. SLP provided homework for sentence responses. CES returned today - results above.   04/10/23: Loud /a/ targeted today to make louder speech more habitual. Average loudness 85dB. Everyday sentences read with average loudness 83dB. SLP engaged in conversational topics (3-4 minutes) x5; SLP req'd to provide min-mod cues, rarely - which increased to mod cues occasionally by last topic. Pt acknowledged he was fatigued from PT where they worked on bed mobility. Pt provided 2 sentence responses with average 65dB with rare min-mod A for loudness. Other multiple sentence responses were challenging today due to decr'd divided attention negatively impacting pt  performance today more than previous sessions, likely due to fatigue factor. SLP encouraged pt to cont to practice at home. "I always practice with my grandkids because I don't want them to have to ask me to repeat like [name] does."  04/03/23: SLP engaged pt with loud /a/ to encourage muscle memory to make louder speech more habitual. Average loudness 85dB. Initial cues for loudness but pt independent after that. Everyday sentences were read with average 81dB.  Word level responses with higher cognitive load completed with average 70dB.  03/27/23: Pt arrived and greeted SLP with 70dB average, and then decr'd to a soft volume once he sat down in ST room (average 64dB). Initial cues  for louder speech and tactile cue for feeling strength in his voice from abdomen, thinking about effort for /a/.  SLP engaged pt in conversation for 38 minutes, with conversational segments of 8-13 minutes, x4. SLP req'd occasional mod cues for pt to improve loudness, faded to occasional min-mod cues. Pt demonstrated decr'd stamina approx 32 minutes into conversational segments. SLP reiterated the necessity to perform loud /a/ at home as prescribed when pt went to lobby to meet wife.   03/21/23 (eval): SPEECH: SLP explained eval results to pt. SLP ensured pt knew the importance of daily loud /a/ and everyday sentences practice BID.  SWALLOWING: Additionally, he and SLP practiced chin down posture with a straw and with a cup.  PATIENT EDUCATION: Education details: see "today's treatment" Person educated: Patient Education method: Explanation and Demonstration Education comprehension: verbalized understanding  HOME EXERCISE PROGRAM: Loud /a/ and everyday sentences.    GOALS: Goals reviewed with patient? No  SHORT TERM GOALS: Target date: 04/20/23  Produce /a/ with average mid-upper 80s dB over three sessions Baseline: 04/12/23 Goal status: Partially met  2.  pt will demo abdominal breathing with sentence  responses 85% accuracy x 3 sessions  Baseline:  Goal status: not addressed yet  3.  pt will generate 2 minutes simple conversation with upper 60s dB average in 2 sessions  Baseline: 04/17/23 Goal status: Partially met  4.  Pt will use strategies for dysfluency in 1-2 minute short conversational segments  Baseline:  Goal status: deferred - dysfluency not heard in sessions thus far   LONG TERM GOALS: Target date: 05/20/23  pt will produce average upper 60s dB in 10 minutes simple to mod complex conversation in 5 sessions  Baseline:  Goal status: INITIAL  2.  pt will maintain upper 60s dB average in 10 minute simple conversation x 3 sessions  Baseline:  Goal status: INITIAL  3.  pt will use abdominal breathing >80% of the time during 10 minute simple conversation in 3 sessions  Baseline:  Goal status: INITIAL  4.  pt will report fewer requests to repeat himself than prior to ST in 3 sessions  Baseline:  Goal status: INITIAL  5.  Pt will use strategies for safer swallowing in 3 sessions with modified independence  Baseline:  Goal status: INITIAL  6.  Pt will score higher/better on PROM in the last 2 weeks of therapy Baseline:  Goal status: INITIAL  ASSESSMENT:  CLINICAL IMPRESSION: Patient is a 72 y.o. male who was seen today for treatment of dysarthria in light of Parkinsons Disease. See "today's treatment" for more details. Pt told SLP on eval date that family members ask him to repeat frequently, and he frequency of communicating in social situations has decr'd since last session of previous therapy course. Pt was able to improve loudness in simple conversation for 3 minutes and thus is an excellent candidate for ST.  Additionally when pt used chin tuck posture with liquids as told on MBS in August 2023, he felt safer.  OBJECTIVE IMPAIRMENTS: Objective impairments include executive functioning, aphasia, dysarthria, and dysphagia. These impairments are limiting patient from  household responsibilities, ADLs/IADLs, effectively communicating at home and in community, and safety when swallowing.Factors affecting potential to achieve goals and functional outcome are  none .Marland Kitchen Patient will benefit from skilled SLP services to address above impairments and improve overall function.  REHAB POTENTIAL: Good  PLAN:  SLP FREQUENCY: 2x/week  SLP DURATION: 8 weeks  PLANNED INTERVENTIONS: Aspiration precaution training, Diet toleration management ,  Environmental controls, Cueing hierachy, Cognitive reorganization, Internal/external aids, Oral motor exercises, Functional tasks, Multimodal communication approach, SLP instruction and feedback, Compensatory strategies, and Patient/family education    [facility], CCC-SLP 04/24/2023, 2:32 PM

## 2023-04-24 NOTE — Therapy (Signed)
OUTPATIENT PHYSICAL THERAPY NEURO TREATMENT   Patient Name: Matthew [name] MRN: [id number] DOB:Dec 04, 1950, 72 y.o., male Today's Date: 04/24/2023   PCP: Daisy Floro, MD REFERRING PROVIDER: Tat, Octaviano Batty, DO  END OF SESSION:  PT End of Session - 04/24/23 1324     Visit Number 7    Number of Visits 17    Date for PT Re-Evaluation 05/16/23    Authorization Type [employer]    Progress Note Due on Visit 10    PT Start Time 1320    PT Stop Time 1400    PT Time Calculation (min) 40 min    Activity Tolerance Patient tolerated treatment well    Behavior During Therapy WFL for tasks assessed/performed                 Past Medical History:  Diagnosis Date   Anxiety    Arthritis    GERD (gastroesophageal reflux disease)    occ remote history   Hypertension    Parkinson disease (HCC)    Prostate cancer (HCC)    Right knee pain    questionable meniscal tear   Seasonal allergies    Past Surgical History:  Procedure Laterality Date   COLONOSCOPY     Leg Laceration Repair  Left    Age 34   PELVIC LYMPH NODE DISSECTION Bilateral 03/22/2018   Procedure: PELVIC LYMPH NODE DISSECTION;  Surgeon: Rene Paci, MD;  Location: [facility];  Service: Urology;  Laterality: Bilateral;   PROSTATE BIOPSY     ROBOT ASSISTED LAPAROSCOPIC RADICAL PROSTATECTOMY N/A 03/22/2018   Procedure: XI ROBOTIC ASSISTED LAPAROSCOPIC PROSTATECTOMY;  Surgeon: Rene Paci, MD;  Location: [facility];  Service: Urology;  Laterality: N/A;   TOTAL KNEE ARTHROPLASTY Right 05/05/2019   Procedure: RIGHT TOTAL KNEE ARTHROPLASTY;  Surgeon: Gean Birchwood, MD;  Location: [facility];  Service: Orthopedics;  Laterality: Right;   Patient Active Problem List   Diagnosis Date Noted   S/P TKR (total knee replacement), right 05/05/2019   Osteoarthritis of right knee 05/02/2019   Prostate cancer (HCC) 03/22/2018   Malignant neoplasm of prostate (HCC) 02/27/2018    ONSET DATE: PD  2021  REFERRING DIAG: G20.A2 (ICD-10-CM) - Parkinson's disease without dyskinesia, with fluctuating manifestations (HCC)   THERAPY DIAG:  Muscle weakness (generalized)  Unsteadiness on feet  Abnormal posture  Other symptoms and signs involving the musculoskeletal system  Other abnormalities of gait and mobility  Parkinson's disease without dyskinesia, with fluctuating manifestations (HCC)  Rationale for Evaluation and Treatment: Rehabilitation  SUBJECTIVE:  SUBJECTIVE STATEMENT: Doing ok, takes a long time to get ready to leave the house.   Pt accompanied by: self, wife  PERTINENT HISTORY: PD, R TKR, prostate CA, HTN  PAIN:  Are you having pain? Yes: NPRS scale: 0/10 Pain location: upper back/lower back "it moves" Bilateral shoulders intermittently painful Pain description: hard to describe Aggravating factors: certain movements/times Relieving factors: rest  PRECAUTIONS: Fall  RED FLAGS: None   WEIGHT BEARING RESTRICTIONS: No  FALLS: Has patient fallen in last 6 months? Yes. Number of falls "several"  LIVING ENVIRONMENT: Lives with: lives with their family Lives in: House/apartment Stairs: Yes, 2-3 steps to enter Has following equipment at home: Dan Humphreys - 4 wheeled and hospital bed  PLOF: Needs assistance with ADLs, Needs assistance with transfers, and bed mobility  PATIENT GOALS: improve balance, mobility, strength. "Get as normal as I can". Be able to step up into son's Jeep  OBJECTIVE:   TODAY'S TREATMENT: 04/24/23 Activity Comments  Recumbent cycling x 8 min Assisted speed intervals. 30 sec speed 60-80 RPM with assistance and 30 sec 30-50 RPM rest intervals  Gait training Strategies to employer greater stride length, measured distance in counted steps. Gait for speed with  count-down timer to make measured 34ft distance Retrowalking 2x25 with cues for hip extension and initial contact with big toe for push-off(back)  Seated quad set/terminal knee extension 2x60 sec Foot on physioball, active quad engagement for hamstring stretch for terminal knee extension ROM  Wall squats 1x10 wide BOS 2x10 with PWR UP for large amplitude  Dynamic standing balance -kicking physioball left/right 2x10 reps -standing bounce pass physioball 2x10        TODAY'S TREATMENT: 04/17/2023 Activity Comments  NuStep, Level 4>5, 4 extremities x 8 minutes 30 sec bouts >90   TUG:  29.53 sec   FTSTS:  15.41 sec Improved from 23 seconds  Sit to stand 10 reps, arms crossed at chest Cues for quad/glut activation upon standing  Reviewed pt's HEP Pt demo standing PWR! Moves-good demo, discussed ways to increase intensity  LAQ 2 x 10 March 2 x 10 Step out and in, 2 x 10 3#  Practiced from simulated higher/squishier seat, sitting on balance disk, 2 x 5 reps, cues to rock back BIG, then forward stomp and push through legs to stand up Simulating recliner     TODAY'S TREATMENT: 04/12/23  Used the following sequencing to review bed mobility technique from previous session: "To get into the bed:  -Sit at the bed as close to the rail as you can-SCOOT  BACK as FAR as you can  -[name] Side to side to get momentum-then [name] 1 (BIG), 2 (BIGGER), 3 (BIGGEST) to lie down and swing your feet up onto the bed (required min A for LE management) -STEP and SCOOT to get in position in the bed    To get out of the bed:  -Bend your knees, then [name] your knees side to side to loosen up your legs (performing lower trunk rotation) -[name] knees to the side with REACHING your left arm up and across toward the bed rail, [name] 1 (BIG), 2 (BIGGER), 3 (BIGGEST) towards R side, then swing feet off the bed, PUSH up through your right elbow to sit up on the side of the bed (Pt required supervision and cueing  only)    Activity Comments  Nustep L5 x 6 min UEs/LEs (dropped to L4 d/t pt unable to reach goal pace) Cues to maintain at least 80 SPM (pt had  difficulty reaching this speed)  standing PWR moves Up 2x10 [name] 2x10 twist 10x Step 10x  In II bars. Small movements which start to run together. Limited weight shift. *performed a 2nd set of PWR up and rock holding red TB in hands for increased effort.        HOME EXERCISE PROGRAM Last updated: 03/27/23 Supine PWR! Moves 10x each daily   Access Code: [email or web address] URL: [email or web address] Date: 04/03/2023 Prepared by: [facility] - Outpatient  Rehab - [facility]  Exercises - Supine Hamstring Stretch with Strap  - 1 x daily - 5 x weekly - 2 sets - 30 sec hold - Supine Piriformis Stretch with Foot on Ground  - 1 x daily - 5 x weekly - 2 sets - 30 sec hold - Supine Figure 4 Piriformis Stretch  - 1 x daily - 5 x weekly - 2 sets - 30 sec hold    Note: Objective measures were completed at Evaluation unless otherwise noted.  DIAGNOSTIC FINDINGS: n/a for this episode  COGNITION: Overall cognitive status:  notes increased issues with memory   SENSATION: Not tested, denies parasthesias  COORDINATION: Deficits with rapid alternating movements Finger to nose intact    MUSCLE TONE: BLE rigidity  MUSCLE LENGTH: Hamstrings: Right -30 deg; Left -30 deg   DTRs:  NT  POSTURE: rounded shoulders, forward head, increased thoracic kyphosis, and flexed trunk   LOWER EXTREMITY ROM:     Active  Right Eval Left Eval  Hip flexion    Hip extension    Hip abduction    Hip adduction    Hip internal rotation    Hip external rotation    Knee flexion    Knee extension -30 -30  Ankle dorsiflexion 10 10  Ankle plantarflexion    Ankle inversion    Ankle eversion     (Blank rows = not tested)  LOWER EXTREMITY MMT:    BLE 4/5 gross strength  BED MOBILITY:  Moderate assist  TRANSFERS: Assistive device utilized:  Environmental consultant - 4 wheeled  Sit to stand: Complete Independence and Modified independence Stand to sit: Complete Independence Chair to chair: Complete Independence and Modified independence Floor:  NT    CURB:  Level of Assistance: CGA Assistive device utilized: Environmental consultant - 4 wheeled Curb Comments:   STAIRS: Reports stairs to 2nd floor but does not use 2-3 stairs to enter home  GAIT: Gait pattern: knee flexed in stance- Right and knee flexed in stance- Left Distance walked:  Assistive device utilized: Environmental consultant - 4 wheeled Level of assistance: Modified independence and SBA Comments:   FUNCTIONAL TESTS:  Mini-BESTest  11/28  Timed Up and Go test:26 sec with 4WW (compared to 15 sec)   10 meter walk test:15.31 sec = 2.14 ft/sec  (compared to 2.01 ft/sec)   5 time sit to stand test: 23 sec-(compared to 11 sec)    PATIENT EDUCATION: Education details: assessment details, rationale of intervention Person educated: Patient Education method: Explanation Education comprehension: verbalized understanding  HOME EXERCISE PROGRAM: TBD-reports daily performance 20 reps standing PWR! moves  GOALS: Goals reviewed with patient? Yes  SHORT TERM GOALS: Target date: 04/18/2023    Patient will be independent in HEP to improve functional outcomes Baseline: pt demo standing PWR! Moves + sit to stand Goal status: MET 04/17/2023  2.  Demo improved BLE strength and balance per time 15 sec 5xSTS test Baseline: 23>15.41 sec 04/17/2023 Goal status: MET 04/17/2023  3.  Demo improved safety with ambulation per time  15 sec TUG test Baseline: 34.53 sec>29.53 sec Goal status: IN PROGRESS    LONG TERM GOALS: Target date: 05/16/2023    Improve hamstring flexibility to -15 degrees to reduce rigidity and enable greater knee extension ROM for mobility Baseline: -30 Goal status: IN PROGRESS  2.  Reduce risk for falls per score 20/28 Mini-BESTest Baseline: 11/20 Goal status: IN PROGRESS  3.  Demo  modified independent bed mobility to reduce level of assist from caregivers  Baseline: moderate assist  Goal status: IN PROGRESS  ASSESSMENT:  CLINICAL IMPRESSION: Session initiated with speed intervals on recumbent bike with assist from therapist to increase RPM to 70-80+ for interval period and guiding in rest periods for smooth, fluid pedal stroke to improve rapid alternating movement .  Gait training following cycling trials with use of visual and time targets to improve stride length as well as forced speed with good results demonstrating ability to negotiate 25 ft increments in 10+ steps of large amplitude.  Techniques to reduce LE rigidity/tone to improve knee extension ROM to reduce contracture for improved quad engagement/mechanical advantage for mobility and heel strike initial contact.  Session ending with dynamic standing balance to facilitate weight shifting and single limb support to improve stability in gait and when negotiating turns.  Tolerated session well reporting end of session fatigue to 6/10 scale. Continued sessions to progress POC details and improve mobility/reduce risk for falls  OBJECTIVE IMPAIRMENTS: Abnormal gait, decreased activity tolerance, decreased balance, decreased coordination, decreased mobility, difficulty walking, decreased ROM, decreased strength, impaired flexibility, impaired tone, improper body mechanics, postural dysfunction, and pain.   ACTIVITY LIMITATIONS: carrying, lifting, bending, standing, squatting, stairs, transfers, bed mobility, reach over head, and locomotion level  PARTICIPATION LIMITATIONS: meal prep, cleaning, laundry, interpersonal relationship, shopping, community activity, yard work, and hobbies of music  PERSONAL FACTORS: Age, Past/current experiences, Time since onset of injury/illness/exacerbation, and 1-2 comorbidities: PMH  are also affecting patient's functional outcome.   REHAB POTENTIAL: Good  CLINICAL DECISION MAKING:  Evolving/moderate complexity  EVALUATION COMPLEXITY: Moderate  PLAN:  PT FREQUENCY: 2x/week  PT DURATION: 8 weeks  PLANNED INTERVENTIONS: 97110-Therapeutic exercises, 97530- Therapeutic activity, [id number]- Neuromuscular re-education, 97535- Self Care, [id number]- Manual therapy, [phone]- Gait training, [phone]- Orthotic Fit/training, [phone]- Canalith repositioning, [phone]- Aquatic Therapy, Patient/Family education, Balance training, Stair training, Taping, Dry Needling, Joint mobilization, Spinal mobilization, Vestibular training, DME instructions, Cryotherapy, and Moist heat  PLAN FOR NEXT SESSION: Seated leg strengthening, sit to stand from varied surfaces.  Review bed mobility techniques with wife present; review standing PWR moves HEP, supine power moves and stretching    2:10 PM, 04/24/23 M. Shary Decamp, PT, DPT Physical Therapist- [place] Office Number: [phone]

## 2023-04-26 ENCOUNTER — Ambulatory Visit: Payer: PPO | Admitting: Occupational Therapy

## 2023-04-26 ENCOUNTER — Ambulatory Visit: Payer: PPO

## 2023-04-26 ENCOUNTER — Encounter: Payer: Self-pay | Admitting: Physical Therapy

## 2023-04-26 ENCOUNTER — Ambulatory Visit: Payer: PPO | Admitting: Physical Therapy

## 2023-04-26 DIAGNOSIS — R2681 Unsteadiness on feet: Secondary | ICD-10-CM

## 2023-04-26 DIAGNOSIS — R278 Other lack of coordination: Secondary | ICD-10-CM

## 2023-04-26 DIAGNOSIS — M6281 Muscle weakness (generalized): Secondary | ICD-10-CM

## 2023-04-26 DIAGNOSIS — R2689 Other abnormalities of gait and mobility: Secondary | ICD-10-CM

## 2023-04-26 DIAGNOSIS — R29818 Other symptoms and signs involving the nervous system: Secondary | ICD-10-CM

## 2023-04-26 DIAGNOSIS — R41841 Cognitive communication deficit: Secondary | ICD-10-CM

## 2023-04-26 DIAGNOSIS — R471 Dysarthria and anarthria: Secondary | ICD-10-CM | POA: Diagnosis not present

## 2023-04-26 NOTE — Therapy (Signed)
OUTPATIENT SPEECH LANGUAGE PATHOLOGY PARKINSON'S TREATMENT   Patient Name: Matthew [name] MRN: [id number] DOB:[birth year], 72 y.o., male Today's Date: 04/26/2023  PCP: Gildardo Cranker, MD REFERRING PROVIDER: Kerin Salen, DO  END OF SESSION:  End of Session - 04/26/23 1610     Visit Number 8    Number of Visits 17    Date for SLP Re-Evaluation 05/25/23    SLP Start Time 1533    SLP Stop Time  1615    SLP Time Calculation (min) 42 min    Activity Tolerance Patient tolerated treatment well             Past Medical History:  Diagnosis Date   Anxiety    Arthritis    GERD (gastroesophageal reflux disease)    occ remote history   Hypertension    Parkinson disease (HCC)    Prostate cancer (HCC)    Right knee pain    questionable meniscal tear   Seasonal allergies    Past Surgical History:  Procedure Laterality Date   COLONOSCOPY     Leg Laceration Repair  Left    Age 3   PELVIC LYMPH NODE DISSECTION Bilateral 03/22/2018   Procedure: PELVIC LYMPH NODE DISSECTION;  Surgeon: Rene Paci, MD;  Location: [facility];  Service: Urology;  Laterality: Bilateral;   PROSTATE BIOPSY     ROBOT ASSISTED LAPAROSCOPIC RADICAL PROSTATECTOMY N/A 03/22/2018   Procedure: XI ROBOTIC ASSISTED LAPAROSCOPIC PROSTATECTOMY;  Surgeon: Rene Paci, MD;  Location: [facility];  Service: Urology;  Laterality: N/A;   TOTAL KNEE ARTHROPLASTY Right 05/05/2019   Procedure: RIGHT TOTAL KNEE ARTHROPLASTY;  Surgeon: Gean Birchwood, MD;  Location: [facility];  Service: Orthopedics;  Laterality: Right;   Patient Active Problem List   Diagnosis Date Noted   S/P TKR (total knee replacement), right 05/05/2019   Osteoarthritis of right knee 05/02/2019   Prostate cancer (HCC) 03/22/2018   Malignant neoplasm of prostate (HCC) 02/27/2018    ONSET DATE: script dated 03-06-23  REFERRING DIAG: Parkinson's Disease  THERAPY DIAG:  Cognitive communication deficit  Dysarthria and  anarthria  Rationale for Evaluation and Treatment: Rehabilitation  SUBJECTIVE:   SUBJECTIVE STATEMENT: "I've had a pretty good day." Pt accompanied by: self  PERTINENT HISTORY: Pt well-known to this SLP from previous courses of ST. MBS in August 2023- results below. Pt's last ST course resulted in pt partially meeting goal for producing speech in upper 60s dB in simple to mod complex with occasional min A.  PAIN:  Are you having pain? No  FALLS: Has patient fallen in last 6 months?  See PT evaluation for details  PATIENT GOALS: Improve loudness and fluency  OBJECTIVE:   DIAGNOSTIC FINDINGS:  MBS 12-22-21 Patient presents with functional oropharyngeal swallowing without aspiration of any consistency tested. He does demonstrate minimal asymptomatic laryngeal penetration of thin liquid due to decreased timing of laryngeal closure. Chin tuck posture with use of straw effective to prevent penetration. Pharyngeal swallow is strong without retention. Oral transiting discoordination noted with pt swallowing barium tablet as he required 2nd bolus of thin to transit through oropharynx. Upon esophageal sweep, pt appeared with barium tablet retained in esophagus with pt awareness. Tablet appeared to transit distally after several boluses of liquids. After tablet had transited, pt continued with "phantom" sensation to esophagus. Recommend pt continue diet as tolerated, using chin tuck with liquids if improves comfort, decreases cough. Also encouraged pt to assure maintains strength of voice, cough and expectoration for airway protection.  PATIENT REPORTED OUTCOME MEASURES (PROM): Communication Effectiveness Survey: pt returned CES on 04/12/23 with score of 18/40, with lower scores indicating more impact of pt's deficits on daily communication situations.   TODAY'S TREATMENT:                                                                                                                                          DATE:  04/26/23: Pt talked with SLP in simple conversation for 15 minutes with rare min A for loudness. Loud /a/ targeted today to make louder speech more habitual with average loudness 83dB, with rare min A for loudness. Everyday sentences read with average loudness 80dB with rare min-mod cues for loudness. Conversation afterwards (simple to mod complex) of 15 minutes averaged 67dB with occasional min A for loudness/effort.  04/24/23: Pt taking his PD meds just prior to ST. Pt states he performed loud /a/ and sentences 5/7 days. Loud /a/ targeted today to make louder speech more habitual. Average loudness 85dB, with rare incr'd to occasional min A for loudness. Everyday sentences read with average loudness 80dB with occasional min-mod cues for loudness. Pt req'd usual min-mod cues for loudness in 3-4 minute conversational segments. SLP encouraged pt to cont to practice consistently.   04/17/23: Pt states family is asking him less frequently for repeats. Today in short conversation segments of 3 minutes, pt maintained upper 60s dB average for 4 segments and then req'd incr'ing level of cueing to occasional min-mod A by 7th segment. SLP encouraged pt to cont to practice at home.   04/12/23: Loud /a/ targeted today to make louder speech more habitual. Average loudness 88dB, with usual min A for loudness. Everyday sentences read with average loudness 82dB with usual mod cues for loudness and tactile cues of abdominal recruitment. In sentence responses pt req'd min-mod A for loudness, occasionally. Pt appeared fatigued from previous therapies today and told SLP affirmatively he was fatigued when SLP asked him. SLP provided homework for sentence responses. CES returned today - results above.   04/10/23: Loud /a/ targeted today to make louder speech more habitual. Average loudness 85dB. Everyday sentences read with average loudness 83dB. SLP engaged in conversational topics (3-4 minutes) x5; SLP req'd to  provide min-mod cues, rarely - which increased to mod cues occasionally by last topic. Pt acknowledged he was fatigued from PT where they worked on bed mobility. Pt provided 2 sentence responses with average 65dB with rare min-mod A for loudness. Other multiple sentence responses were challenging today due to decr'd divided attention negatively impacting pt performance today more than previous sessions, likely due to fatigue factor. SLP encouraged pt to cont to practice at home. "I always practice with my grandkids because I don't want them to have to ask me to repeat like [name] does."  04/03/23: SLP engaged pt with loud /a/ to encourage muscle memory to make louder speech more habitual.  Average loudness 85dB. Initial cues for loudness but pt independent after that. Everyday sentences were read with average 81dB.  Word level responses with higher cognitive load completed with average 70dB.  03/27/23: Pt arrived and greeted SLP with 70dB average, and then decr'd to a soft volume once he sat down in ST room (average 64dB). Initial cues for louder speech and tactile cue for feeling strength in his voice from abdomen, thinking about effort for /a/.  SLP engaged pt in conversation for 38 minutes, with conversational segments of 8-13 minutes, x4. SLP req'd occasional mod cues for pt to improve loudness, faded to occasional min-mod cues. Pt demonstrated decr'd stamina approx 32 minutes into conversational segments. SLP reiterated the necessity to perform loud /a/ at home as prescribed when pt went to lobby to meet wife.   03/21/23 (eval): SPEECH: SLP explained eval results to pt. SLP ensured pt knew the importance of daily loud /a/ and everyday sentences practice BID.  SWALLOWING: Additionally, he and SLP practiced chin down posture with a straw and with a cup.  PATIENT EDUCATION: Education details: see "today's treatment" Person educated: Patient Education method: Explanation and Demonstration Education  comprehension: verbalized understanding  HOME EXERCISE PROGRAM: Loud /a/ and everyday sentences.    GOALS: Goals reviewed with patient? No  SHORT TERM GOALS: Target date: 04/20/23  Produce /a/ with average mid-upper 80s dB over three sessions Baseline: 04/12/23 Goal status: Partially met  2.  pt will demo abdominal breathing with sentence responses 85% accuracy x 3 sessions  Baseline:  Goal status: not addressed yet  3.  pt will generate 2 minutes simple conversation with upper 60s dB average in 2 sessions  Baseline: 04/17/23 Goal status: Partially met  4.  Pt will use strategies for dysfluency in 1-2 minute short conversational segments  Baseline:  Goal status: deferred - dysfluency not heard in sessions thus far   LONG TERM GOALS: Target date: 05/20/23  pt will produce average upper 60s dB in 10 minutes simple to mod complex conversation in 5 sessions  Baseline:  Goal status: INITIAL  2.  pt will maintain upper 60s dB average in 10 minute simple conversation x 3 sessions  Baseline: 04/26/23 Goal status: INITIAL  3.  pt will use abdominal breathing >80% of the time during 10 minute simple conversation in 3 sessions  Baseline: 04/26/23 Goal status: INITIAL  4.  pt will report fewer requests to repeat himself than prior to ST in 3 sessions  Baseline:  Goal status: INITIAL  5.  Pt will use strategies for safer swallowing in 3 sessions with modified independence  Baseline:  Goal status: INITIAL  6.  Pt will score higher/better on PROM in the last 2 weeks of therapy Baseline:  Goal status: INITIAL  ASSESSMENT:  CLINICAL IMPRESSION: Patient is a 72 y.o. male who was seen today for treatment of dysarthria in light of Parkinsons Disease. See "today's treatment" for more details. Pt told SLP on eval date that family members ask him to repeat frequently, and he frequency of communicating in social situations has decr'd since last session of previous therapy course. Pt  was able to improve loudness in simple conversation for 3 minutes and thus is an excellent candidate for ST.  Additionally when pt used chin tuck posture with liquids as told on MBS in August 2023, he felt safer.  OBJECTIVE IMPAIRMENTS: Objective impairments include executive functioning, aphasia, dysarthria, and dysphagia. These impairments are limiting patient from household responsibilities, ADLs/IADLs, effectively communicating at home  and in community, and safety when swallowing.Factors affecting potential to achieve goals and functional outcome are  none .Marland Kitchen Patient will benefit from skilled SLP services to address above impairments and improve overall function.  REHAB POTENTIAL: Good  PLAN:  SLP FREQUENCY: 2x/week  SLP DURATION: 8 weeks  PLANNED INTERVENTIONS: Aspiration precaution training, Diet toleration management , Environmental controls, Cueing hierachy, Cognitive reorganization, Internal/external aids, Oral motor exercises, Functional tasks, Multimodal communication approach, SLP instruction and feedback, Compensatory strategies, and Patient/family education    [facility], CCC-SLP 04/26/2023, 4:24 PM

## 2023-04-26 NOTE — Therapy (Signed)
OUTPATIENT OCCUPATIONAL THERAPY [facility]ARKINSON[facility]S Treatment Note  Patient Name: Matthew [name] MRN: [id number] DOB:09-Oct-1950, 72 y.o., male Today's Date: 04/26/2023  PCP: Daisy Floro, MD REFERRING PROVIDER: Tat, Octaviano Batty, DO  END OF SESSION:  OT End of Session - 04/26/23 1456     Visit Number 6    Number of Visits 17    Date for OT Re-Evaluation 05/16/23    Authorization Type [employer]    OT Start Time 1450    OT Stop Time 1530    OT Time Calculation (min) 40 min                  Past Medical History:  Diagnosis Date   Anxiety    Arthritis    GERD (gastroesophageal reflux disease)    occ remote history   Hypertension    Parkinson disease (HCC)    Prostate cancer (HCC)    Right knee pain    questionable meniscal tear   Seasonal allergies    Past Surgical History:  Procedure Laterality Date   COLONOSCOPY     Leg Laceration Repair  Left    Age 59   PELVIC LYMPH NODE DISSECTION Bilateral 03/22/2018   Procedure: PELVIC LYMPH NODE DISSECTION;  Surgeon: Rene Paci, MD;  Location: [facility];  Service: Urology;  Laterality: Bilateral;   PROSTATE BIOPSY     ROBOT ASSISTED LAPAROSCOPIC RADICAL PROSTATECTOMY N/A 03/22/2018   Procedure: XI ROBOTIC ASSISTED LAPAROSCOPIC PROSTATECTOMY;  Surgeon: Rene Paci, MD;  Location: [facility];  Service: Urology;  Laterality: N/A;   TOTAL KNEE ARTHROPLASTY Right 05/05/2019   Procedure: RIGHT TOTAL KNEE ARTHROPLASTY;  Surgeon: Gean Birchwood, MD;  Location: [facility];  Service: Orthopedics;  Laterality: Right;   Patient Active Problem List   Diagnosis Date Noted   S/P TKR (total knee replacement), right 05/05/2019   Osteoarthritis of right knee 05/02/2019   Prostate cancer (HCC) 03/22/2018   Malignant neoplasm of prostate (HCC) 02/27/2018    ONSET DATE: referral date 03/06/23  REFERRING DIAG: G20.A2 (ICD-10-CM) - Parkinson's disease without dyskinesia, with fluctuating manifestations  THERAPY  DIAG:  Muscle weakness (generalized)  Other symptoms and signs involving the nervous system  Unsteadiness on feet  Other lack of coordination  Rationale for Evaluation and Treatment: Rehabilitation  SUBJECTIVE:   SUBJECTIVE STATEMENT: Pt reports that his back has been bothering him.   Pt accompanied by: self and significant other  PERTINENT HISTORY: PD, anxiety, arthritis, GERD, HTN, R TKR   PRECAUTIONS: Fall  WEIGHT BEARING RESTRICTIONS: No  PAIN:  Are you having pain? No  FALLS: Has patient fallen in last 6 months? Yes. Number of falls Pt reports "having a time where he fell frequently" but has fallen ~3 times in the last "few months" with most recent being Saturday.  LIVING ENVIRONMENT: Lives with: lives with their spouse Lives in: House/apartment Stairs:  Yes: Internal: full flight of step, but able to stay on main floor without need to navigate steps; and External: 2 steps; on right going up Has following equipment at home: Single point cane, Quad cane small base, Walker - 2 wheeled, Environmental consultant - 4 wheeled, Wheelchair (manual), shower chair, Grab bars, and transport chair - for dr appts  PLOF: Requires assistive device for independence and Needs assistance with ADLs  PATIENT GOALS: to be as close to normal as possible  OBJECTIVE:  Note: Objective measures were completed at Evaluation unless otherwise noted.  HAND DOMINANCE: Right  ADLs: Overall ADLs: reports overall slowing, wife  is "second pair of hands" Transfers/ambulation related to ADLs: utilizing small 4 wheeled walker for all mobility  Eating: wife assisting with cutting foods, pt spilling foods off utensil, constantly spilling things off the plate, having to use finger to stabilize food when scooping with R hand Grooming: utilizing electric toothbrush and water pick for oral care, difficulty with brushing hair  UB Dressing: primarily wearing t-shirts/pull over shirts, occasional assist from wife if in a hurry  or get stuck, no longer wearing button up shirts anymore.  Wife will assist with donning shirt if completing after shower LB Dressing: difficulty with reaching forward towards feet when donning pants, socks, shoes; increased difficulty when crossing leg , no longer wearing lace up shoes Toileting: requires momentum to stand up from toilet or will reach towards door for leverage/momentum, reports "struggle" with hygiene  Bathing: occasional assist for thoroughness when drying off  Tub Shower transfers: wife provides supervision, handle is just inside door and pt able to hold on to it while stepping over small ledge Equipment: Grab bars, Walk in shower, and Long handled sponge  IADLs: Spouse completes all shopping, meal prep, and housekeeping tasks. Community mobility: Relies on family or friends for transportation Medication management: pt fills pill box and is able to take correct dosages at correct time Financial management: wife completes and did prior Handwriting:  TBA  MOBILITY STATUS: Hx of falls and difficulty carrying objections with ambulation  POSTURE COMMENTS:  forward head  ACTIVITY TOLERANCE: Activity tolerance: diminished  FUNCTIONAL OUTCOME MEASURES: Physical performance test: PPT#2 (simulated eating) 25.91 & PPT#4 (donning/doffing jacket): TBA  COORDINATION: 9 Hole Peg test: Right: 1:36.03 sec; Left: 58.28 sec  UE ROM:   B shoulder flexion ~120 *, reports mild discomfort in R upper arm with internal rotation but able to achieve full ROM  UE MMT:    grossly 4/5 bilaterally  MUSCLE TONE: RUE: Mild and LUE: Mild  COGNITION: Overall cognitive status:  slower processing and speech  OBSERVATIONS: Bradykinesia   TODAY'S TREATMENT:                                                                                 DATE:  04/26/23 Large amplitude: engaged in open chest exercises in sitting and then in standing with focus on upright posture and large amplitude when reaching  out to tap upright poles out to side and then tap together in front of pt.  Transitioned to reaching over head and tapping boom whackers overhead and then to ball at lower medline in front of pt.  OT providing pt with targets to facilitate increased amplitude and use of boom whackers for feedback.  Pt with decreased upright posture in standing due to reports of soreness in lower back. Large amplitude: pt completed finger flicks with elbow, wrist, and finger flexion prior to engaging in coordination task.  Completing finger flicks intermittently during coordination task with intermittent improvements observed. Coordination: with RUE, picking up various sized pegs and placing in to peg board.  Pt with increased difficulty with smaller pegs, therefore placing wash cloth under pegs to allow for increased friction to improve ease with picking up pegs.  Pt still with min difficulty, however  much improved with friction.  During removal of pegs, engaged in rotation of pegs 180*.  Pt with increased difficulty with shorter pegs.    04/17/23 Large amplitude: engaged in finger flicks with focus on full elbow and wrist extension and fingers open big.  Completed x10 with min cues for technique and amplitude. Forearm supination/pronation x10 with cues for full supination, followed by thumb opposition with focus on large amplitude. Coordination: w/ [employer] UE including: flipping cards one at a time off a deck, picking up coins and placing into container, and picking up 4 coins 1 at a time and translating palm to fingertips to place into coin slot.  Pt completed coins with RUE and then LUE with improved ease with picking up coins one at a time during 2nd attempt with L.  Pt with difficulty with translation of coins from palm to finger tips bilaterally, L > R, therefore terminated task on L side after just one.  OT providing demonstration and cues for large amplitude and forearm supination when flipping and releasing cards with  RUE.  Completed card flipping in standing with CGA for standing balance.    04/12/23 Large amplitude: engaged in finger flicks with focus on full elbow and wrist extension and fingers open big.  Completed x10 with min cues for technique and amplitude.  Self-feeding: engaged in scooping simulated foods (rubber bands, dry beans, cut straws) with fork and spoon.  Trialed various techniques with use of built up handle on fork, use of spoon in L hand to provide buffer to aid in scooping, use of plate guard to provide ledge to aid in scooping, and tipping plate small angle to aid in scooping.  OT encouraged pt to trial various combinations of these techniques and recommended additional dishes/devices that may aid as well.  Provided with handout.   PATIENT EDUCATION: Education details: condition specific education, large amplitude exercises Person educated: Patient Education method: Explanation Education comprehension: needs further education  HOME EXERCISE PROGRAM: Access Code: [email or web address] URL: [email or web address] Date: 04/10/2023 Prepared by: [facility] - Outpatient  Rehab - [facility]  Exercises - Seated Shoulder Shrugs  - 2 x daily - 5 reps - Seated Scapular Retraction  - 2 x daily - 5 reps - Seated Finger Flicks with Elbow Extension  - 1 x daily - 2 sets - 5-10 reps - Seated Reaching to Side and Across Body  - 1 x daily - 2 sets - 5-10 reps - Seated Reaching to Side  - 1 x daily - 2 sets - 5-10 reps  GOALS: Goals reviewed with patient? No  SHORT TERM GOALS: Target date: 04/18/23  Pt will be Independent with PD specific HEP. Baseline: Goal status: IN PROGRESS  2.  Pt will demonstrate improved shoulder flexion bilaterally to retreive a lightweight object from overhead shelf with BUE at >/= 130 shoulder flexion.  Baseline:  Goal status: IN PROGRESS  3.  Pt will verbalize understanding of adapted strategies and DME/AE PRN (reacher, BSC, shower chair, rocker knife,  scoop plate/plate guard, etc) to maximize safety and independence with ADLs/IADLs.  Baseline:  Goal status: IN PROGRESS  4.  Pt will report understanding of modifications and adaptive strategies to improve handwriting legibility and ease. Baseline:  Goal status: IN PROGRESS   LONG TERM GOALS: Target date: 05/16/23  Pt will demonstrate increased functional ROM and use of BUE to aid in clothing management and hygiene with toileting tasks.  Baseline:  Goal status: IN PROGRESS  2.  Pt will demonstrate  improved ability to get up from lower seat/toilet without assistance with DME PRN. Baseline:  Goal status: IN PROGRESS  3.  Pt will demonstrate improved fine motor coordination for ADLs as evidenced by decreasing 9 hole peg test score for RUE by 10 secs on R and 1 or fewer drops. Baseline: R: 1:36 and L: 58 sec.  Pt reports dropping small items frequently Goal status: IN PROGRESS  4.  Pt will demonstrate improved ease with feeding as evidenced by decreasing PPT#2 (self feeding) by 5 secs  Baseline: 25.91 Goal status: IN PROGRESS  5.  Pt will demonstrate and/or report improved independence with LB dressing with use of AE/DME as needed.  Baseline:  Goal status: IN PROGRESS  ASSESSMENT:  CLINICAL IMPRESSION: Pt demonstrating improved amplitude with chest opening and with use of targets to facilitate increased amplitude.  Pt continues to benefit from cues for large amplitude during functional reach and coordination tasks.  Pt with intermittent spontaneous use of finger flicks during coordination tasks.    PERFORMANCE DEFICITS: in functional skills including ADLs, IADLs, coordination, ROM, strength, flexibility, Fine motor control, Gross motor control, balance, body mechanics, endurance, decreased knowledge of precautions, decreased knowledge of use of DME, and UE functional use and psychosocial skills including coping strategies, environmental adaptation, and routines and behaviors.    IMPAIRMENTS: are limiting patient from ADLs, IADLs, and rest and sleep.     PLAN:  OT FREQUENCY: 2x/week  OT DURATION: 8 weeks  PLANNED INTERVENTIONS: 97168 OT Re-evaluation, 97535 self care/ADL training, 14782 therapeutic exercise, 97530 therapeutic activity, 97112 neuromuscular re-education, 97035 ultrasound, 97010 moist heat, 97010 cryotherapy, functional mobility training, psychosocial skills training, energy conservation, coping strategies training, patient/family education, and DME and/or AE instructions  RECOMMENDED OTHER SERVICES: NA  CONSULTED AND AGREED WITH PLAN OF CARE: Patient  PLAN FOR NEXT SESSION: Review and add to large amplitude exercises, initiate bag exercises with focus on improved shoulder ROM, FMC tasks/HEP   Redonna Wilbert, OTR/L 04/26/2023, 2:56 PM   [facility] at [facility] [address] [place], [place] [address] Phone # [phone] Fax # [phone]

## 2023-04-26 NOTE — Therapy (Signed)
OUTPATIENT PHYSICAL THERAPY NEURO TREATMENT   Patient Name: Matthew [name] MRN: [id number] DOB:07/03/1950, 72 y.o., male Today's Date: 04/26/2023   PCP: Daisy Floro, MD REFERRING PROVIDER: Tat, Octaviano Batty, DO  END OF SESSION:  PT End of Session - 04/26/23 1405     Visit Number 8    Number of Visits 17    Date for PT Re-Evaluation 05/16/23    Authorization Type [employer]    Progress Note Due on Visit 10    PT Start Time 1407    PT Stop Time 1445    PT Time Calculation (min) 38 min    Activity Tolerance Patient tolerated treatment well    Behavior During Therapy WFL for tasks assessed/performed                  Past Medical History:  Diagnosis Date   Anxiety    Arthritis    GERD (gastroesophageal reflux disease)    occ remote history   Hypertension    Parkinson disease (HCC)    Prostate cancer (HCC)    Right knee pain    questionable meniscal tear   Seasonal allergies    Past Surgical History:  Procedure Laterality Date   COLONOSCOPY     Leg Laceration Repair  Left    Age 33   PELVIC LYMPH NODE DISSECTION Bilateral 03/22/2018   Procedure: PELVIC LYMPH NODE DISSECTION;  Surgeon: Rene Paci, MD;  Location: [facility];  Service: Urology;  Laterality: Bilateral;   PROSTATE BIOPSY     ROBOT ASSISTED LAPAROSCOPIC RADICAL PROSTATECTOMY N/A 03/22/2018   Procedure: XI ROBOTIC ASSISTED LAPAROSCOPIC PROSTATECTOMY;  Surgeon: Rene Paci, MD;  Location: [facility];  Service: Urology;  Laterality: N/A;   TOTAL KNEE ARTHROPLASTY Right 05/05/2019   Procedure: RIGHT TOTAL KNEE ARTHROPLASTY;  Surgeon: Gean Birchwood, MD;  Location: [facility];  Service: Orthopedics;  Laterality: Right;   Patient Active Problem List   Diagnosis Date Noted   S/P TKR (total knee replacement), right 05/05/2019   Osteoarthritis of right knee 05/02/2019   Prostate cancer (HCC) 03/22/2018   Malignant neoplasm of prostate (HCC) 02/27/2018    ONSET DATE: PD  2021  REFERRING DIAG: G20.A2 (ICD-10-CM) - Parkinson's disease without dyskinesia, with fluctuating manifestations (HCC)   THERAPY DIAG:  Muscle weakness (generalized)  Unsteadiness on feet  Other abnormalities of gait and mobility  Rationale for Evaluation and Treatment: Rehabilitation  SUBJECTIVE:  SUBJECTIVE STATEMENT: Have been rushing around today.  Want to work on balance.  Moving backwards or moving fast or squatting to floor makes me feel that I may fall.  Pt accompanied by: self, wife  PERTINENT HISTORY: PD, R TKR, prostate CA, HTN  PAIN:  Are you having pain? Yes: NPRS scale: 0/10 Pain location: upper back/lower back "it moves" Bilateral shoulders intermittently painful Pain description: hard to describe Aggravating factors: certain movements/times Relieving factors: rest  PRECAUTIONS: Fall  RED FLAGS: None   WEIGHT BEARING RESTRICTIONS: No  FALLS: Has patient fallen in last 6 months? Yes. Number of falls "several"  LIVING ENVIRONMENT: Lives with: lives with their family Lives in: House/apartment Stairs: Yes, 2-3 steps to enter Has following equipment at home: Dan Humphreys - 4 wheeled and hospital bed  PLOF: Needs assistance with ADLs, Needs assistance with transfers, and bed mobility  PATIENT GOALS: improve balance, mobility, strength. "Get as normal as I can". Be able to step up into son's Jeep  OBJECTIVE:    TODAY'S TREATMENT: 04/26/2023 Activity Comments  NuStep, Level 5, 4 extremities x 10 minutes Cues to >80-90 for 30 seconds intervals, 1 minute bouts 70-80  Minisquats at counter 1x10 wide BOS, then up on toes 2x10 with PWR UP for large amplitude  Forward/back walking  Using RW-cues for wider BOS  Forward/back walking varied distances Wider BOS and stagger stance with  stops  Wide BOS squats to pick up object from floor, 8 reps Min guard, cues to return to full stand           HOME EXERCISE PROGRAM Last updated: 03/27/23 Supine PWR! Moves 10x each daily   Access Code: [email or web address] URL: [email or web address] Date: 04/03/2023 Prepared by: [facility] - Outpatient  Rehab - [facility]  Exercises - Supine Hamstring Stretch with Strap  - 1 x daily - 5 x weekly - 2 sets - 30 sec hold - Supine Piriformis Stretch with Foot on Ground  - 1 x daily - 5 x weekly - 2 sets - 30 sec hold - Supine Figure 4 Piriformis Stretch  - 1 x daily - 5 x weekly - 2 sets - 30 sec hold    Note: Objective measures were completed at Evaluation unless otherwise noted.  DIAGNOSTIC FINDINGS: n/a for this episode  COGNITION: Overall cognitive status:  notes increased issues with memory   SENSATION: Not tested, denies parasthesias  COORDINATION: Deficits with rapid alternating movements Finger to nose intact    MUSCLE TONE: BLE rigidity  MUSCLE LENGTH: Hamstrings: Right -30 deg; Left -30 deg   DTRs:  NT  POSTURE: rounded shoulders, forward head, increased thoracic kyphosis, and flexed trunk   LOWER EXTREMITY ROM:     Active  Right Eval Left Eval  Hip flexion    Hip extension    Hip abduction    Hip adduction    Hip internal rotation    Hip external rotation    Knee flexion    Knee extension -30 -30  Ankle dorsiflexion 10 10  Ankle plantarflexion    Ankle inversion    Ankle eversion     (Blank rows = not tested)  LOWER EXTREMITY MMT:    BLE 4/5 gross strength  BED MOBILITY:  Moderate assist  TRANSFERS: Assistive device utilized: Environmental consultant - 4 wheeled  Sit to stand: Complete Independence and Modified independence Stand to sit: Complete Independence Chair to chair: Complete Independence and Modified independence Floor:  NT    CURB:  Level of Assistance: CGA Assistive  device utilized: Environmental consultant - 4 wheeled Curb Comments:    STAIRS: Reports stairs to 2nd floor but does not use 2-3 stairs to enter home  GAIT: Gait pattern: knee flexed in stance- Right and knee flexed in stance- Left Distance walked:  Assistive device utilized: Environmental consultant - 4 wheeled Level of assistance: Modified independence and SBA Comments:   FUNCTIONAL TESTS:  Mini-BESTest  11/28  Timed Up and Go test:26 sec with 4WW (compared to 15 sec)   10 meter walk test:15.31 sec = 2.14 ft/sec  (compared to 2.01 ft/sec)   5 time sit to stand test: 23 sec-(compared to 11 sec)    PATIENT EDUCATION: Education details: assessment details, rationale of intervention Person educated: Patient Education method: Explanation Education comprehension: verbalized understanding  HOME EXERCISE PROGRAM: TBD-reports daily performance 20 reps standing PWR! moves  GOALS: Goals reviewed with patient? Yes  SHORT TERM GOALS: Target date: 04/18/2023    Patient will be independent in HEP to improve functional outcomes Baseline: pt demo standing PWR! Moves + sit to stand Goal status: MET 04/17/2023  2.  Demo improved BLE strength and balance per time 15 sec 5xSTS test Baseline: 23>15.41 sec 04/17/2023 Goal status: MET 04/17/2023  3.  Demo improved safety with ambulation per time 15 sec TUG test Baseline: 34.53 sec>29.53 sec Goal status: IN PROGRESS    LONG TERM GOALS: Target date: 05/16/2023    Improve hamstring flexibility to -15 degrees to reduce rigidity and enable greater knee extension ROM for mobility Baseline: -30 Goal status: IN PROGRESS  2.  Reduce risk for falls per score 20/28 Mini-BESTest Baseline: 11/20 Goal status: IN PROGRESS  3.  Demo modified independent bed mobility to reduce level of assist from caregivers  Baseline: moderate assist  Goal status: IN PROGRESS  ASSESSMENT:  CLINICAL IMPRESSION: Skilled PT session today focused on aerobic strength and functional strength as well as balance.  Worked on increased intensity  of SPM >80 for short, 30 sec intervals, followed by 1 minute intervals of 70-80 SPM.  Worked on squats/squats and return to upright posture for strengthening, followed by squats  to pick up objects safety.  Worked again on backwards walking with walker, and he verbalizes/demo toe>heel placement with increased step length and foot clearance.  He will continue to benefit from skilled PT towards goals for improved functional mobility and decreased fall risk.  OBJECTIVE IMPAIRMENTS: Abnormal gait, decreased activity tolerance, decreased balance, decreased coordination, decreased mobility, difficulty walking, decreased ROM, decreased strength, impaired flexibility, impaired tone, improper body mechanics, postural dysfunction, and pain.   ACTIVITY LIMITATIONS: carrying, lifting, bending, standing, squatting, stairs, transfers, bed mobility, reach over head, and locomotion level  PARTICIPATION LIMITATIONS: meal prep, cleaning, laundry, interpersonal relationship, shopping, community activity, yard work, and hobbies of music  PERSONAL FACTORS: Age, Past/current experiences, Time since onset of injury/illness/exacerbation, and 1-2 comorbidities: PMH  are also affecting patient's functional outcome.   REHAB POTENTIAL: Good  CLINICAL DECISION MAKING: Evolving/moderate complexity  EVALUATION COMPLEXITY: Moderate  PLAN:  PT FREQUENCY: 2x/week  PT DURATION: 8 weeks  PLANNED INTERVENTIONS: 97110-Therapeutic exercises, 97530- Therapeutic activity, [id number]- Neuromuscular re-education, 97535- Self Care, 62130- Manual therapy, [phone]- Gait training, [phone]- Orthotic Fit/training, [phone]- Canalith repositioning, [phone]- Aquatic Therapy, Patient/Family education, Balance training, Stair training, Taping, Dry Needling, Joint mobilization, Spinal mobilization, Vestibular training, DME instructions, Cryotherapy, and Moist heat  PLAN FOR NEXT SESSION: incorporate postural strengthening.  Seated leg strengthening, sit to  stand from varied surfaces.  Review bed mobility techniques with wife present; review standing  PWR moves HEP, supine power moves and stretching    Lonia Blood, PT 04/26/23 4:01 PM Phone: [phone] Fax: [phone]  [facility] at [facility] [address], [address] [place], [place] [address] Phone # [phone] Fax # [phone]

## 2023-05-01 ENCOUNTER — Ambulatory Visit: Payer: PPO | Admitting: Occupational Therapy

## 2023-05-01 ENCOUNTER — Ambulatory Visit: Payer: PPO

## 2023-05-01 DIAGNOSIS — R29818 Other symptoms and signs involving the nervous system: Secondary | ICD-10-CM

## 2023-05-01 DIAGNOSIS — R2681 Unsteadiness on feet: Secondary | ICD-10-CM

## 2023-05-01 DIAGNOSIS — R278 Other lack of coordination: Secondary | ICD-10-CM

## 2023-05-01 DIAGNOSIS — R293 Abnormal posture: Secondary | ICD-10-CM

## 2023-05-01 DIAGNOSIS — R471 Dysarthria and anarthria: Secondary | ICD-10-CM | POA: Diagnosis not present

## 2023-05-01 DIAGNOSIS — G20A2 Parkinson's disease without dyskinesia, with fluctuations: Secondary | ICD-10-CM

## 2023-05-01 DIAGNOSIS — M6281 Muscle weakness (generalized): Secondary | ICD-10-CM

## 2023-05-01 DIAGNOSIS — R2689 Other abnormalities of gait and mobility: Secondary | ICD-10-CM

## 2023-05-01 NOTE — Therapy (Signed)
OUTPATIENT PHYSICAL THERAPY NEURO TREATMENT   Patient Name: Matthew [name] MRN: [id number] DOB:1951-01-21, 72 y.o., male Today's Date: 05/01/2023   PCP: Daisy Floro, MD REFERRING PROVIDER: Tat, Octaviano Batty, DO  END OF SESSION:  PT End of Session - 05/01/23 1409     Visit Number 9    Number of Visits 17    Date for PT Re-Evaluation 05/16/23    Authorization Type [employer]    Progress Note Due on Visit 10    PT Start Time 1400   session began before check in   PT Stop Time 1445    PT Time Calculation (min) 45 min    Activity Tolerance Patient tolerated treatment well    Behavior During Therapy [facility] for tasks assessed/performed                  Past Medical History:  Diagnosis Date   Anxiety    Arthritis    GERD (gastroesophageal reflux disease)    occ remote history   Hypertension    Parkinson disease (HCC)    Prostate cancer (HCC)    Right knee pain    questionable meniscal tear   Seasonal allergies    Past Surgical History:  Procedure Laterality Date   COLONOSCOPY     Leg Laceration Repair  Left    Age 25   PELVIC LYMPH NODE DISSECTION Bilateral 03/22/2018   Procedure: PELVIC LYMPH NODE DISSECTION;  Surgeon: Rene Paci, MD;  Location: [facility];  Service: Urology;  Laterality: Bilateral;   PROSTATE BIOPSY     ROBOT ASSISTED LAPAROSCOPIC RADICAL PROSTATECTOMY N/A 03/22/2018   Procedure: XI ROBOTIC ASSISTED LAPAROSCOPIC PROSTATECTOMY;  Surgeon: Rene Paci, MD;  Location: [facility];  Service: Urology;  Laterality: N/A;   TOTAL KNEE ARTHROPLASTY Right 05/05/2019   Procedure: RIGHT TOTAL KNEE ARTHROPLASTY;  Surgeon: Gean Birchwood, MD;  Location: [facility];  Service: Orthopedics;  Laterality: Right;   Patient Active Problem List   Diagnosis Date Noted   S/P TKR (total knee replacement), right 05/05/2019   Osteoarthritis of right knee 05/02/2019   Prostate cancer (HCC) 03/22/2018   Malignant neoplasm of prostate (HCC)  02/27/2018    ONSET DATE: PD 2021  REFERRING DIAG: G20.A2 (ICD-10-CM) - Parkinson's disease without dyskinesia, with fluctuating manifestations (HCC)   THERAPY DIAG:  Muscle weakness (generalized)  Other symptoms and signs involving the nervous system  Unsteadiness on feet  Other abnormalities of gait and mobility  Abnormal posture  Parkinson's disease without dyskinesia, with fluctuating manifestations (HCC)  Rationale for Evaluation and Treatment: Rehabilitation  SUBJECTIVE:  SUBJECTIVE STATEMENT: Doing ok. Haven't done much today  Pt accompanied by: self, wife  PERTINENT HISTORY: PD, R TKR, prostate CA, HTN  PAIN:  Are you having pain? Yes: NPRS scale: 0/10 Pain location: upper back/lower back "it moves" Bilateral shoulders intermittently painful Pain description: hard to describe Aggravating factors: certain movements/times Relieving factors: rest  PRECAUTIONS: Fall  RED FLAGS: None   WEIGHT BEARING RESTRICTIONS: No  FALLS: Has patient fallen in last 6 months? Yes. Number of falls "several"  LIVING ENVIRONMENT: Lives with: lives with their family Lives in: House/apartment Stairs: Yes, 2-3 steps to enter Has following equipment at home: Dan Humphreys - 4 wheeled and hospital bed  PLOF: Needs assistance with ADLs, Needs assistance with transfers, and bed mobility  PATIENT GOALS: improve balance, mobility, strength. "Get as normal as I can". Be able to step up into son's Jeep  OBJECTIVE:   TODAY'S TREATMENT: 05/01/23 Activity Comments  NU-step speed intervals x 8 min. Resistance level 3 30 sec 100+ SPM; 30 sec 70-80 SPM. Cues for change of pace  Seated rows 1x10 15# 25# 30#  Seated physioball rollout 1x10   Resisted sit to stand 3x10 20" seat, red t-band, cues for  speed/force  LAQ 3x10 5#         TODAY'S TREATMENT: 04/26/2023 Activity Comments  NuStep, Level 5, 4 extremities x 10 minutes Cues to >80-90 for 30 seconds intervals, 1 minute bouts 70-80  Minisquats at counter 1x10 wide BOS, then up on toes 2x10 with PWR UP for large amplitude  Forward/back walking  Using RW-cues for wider BOS  Forward/back walking varied distances Wider BOS and stagger stance with stops  Wide BOS squats to pick up object from floor, 8 reps Min guard, cues to return to full stand           HOME EXERCISE PROGRAM Last updated: 03/27/23 Supine PWR! Moves 10x each daily   Access Code: [email or web address] URL: [email or web address] Date: 04/03/2023 Prepared by: [facility] - Outpatient  Rehab - [facility]  Exercises - Supine Hamstring Stretch with Strap  - 1 x daily - 5 x weekly - 2 sets - 30 sec hold - Supine Piriformis Stretch with Foot on Ground  - 1 x daily - 5 x weekly - 2 sets - 30 sec hold - Supine Figure 4 Piriformis Stretch  - 1 x daily - 5 x weekly - 2 sets - 30 sec hold    Note: Objective measures were completed at Evaluation unless otherwise noted.  DIAGNOSTIC FINDINGS: n/a for this episode  COGNITION: Overall cognitive status:  notes increased issues with memory   SENSATION: Not tested, denies parasthesias  COORDINATION: Deficits with rapid alternating movements Finger to nose intact    MUSCLE TONE: BLE rigidity  MUSCLE LENGTH: Hamstrings: Right -30 deg; Left -30 deg   DTRs:  NT  POSTURE: rounded shoulders, forward head, increased thoracic kyphosis, and flexed trunk   LOWER EXTREMITY ROM:     Active  Right Eval Left Eval  Hip flexion    Hip extension    Hip abduction    Hip adduction    Hip internal rotation    Hip external rotation    Knee flexion    Knee extension -30 -30  Ankle dorsiflexion 10 10  Ankle plantarflexion    Ankle inversion    Ankle eversion     (Blank rows = not tested)  LOWER EXTREMITY  MMT:    BLE 4/5 gross strength  BED MOBILITY:  Moderate assist  TRANSFERS: Assistive device utilized: Environmental consultant - 4 wheeled  Sit to stand: Complete Independence and Modified independence Stand to sit: Complete Independence Chair to chair: Complete Independence and Modified independence Floor:  NT    CURB:  Level of Assistance: CGA Assistive device utilized: Environmental consultant - 4 wheeled Curb Comments:   STAIRS: Reports stairs to 2nd floor but does not use 2-3 stairs to enter home  GAIT: Gait pattern: knee flexed in stance- Right and knee flexed in stance- Left Distance walked:  Assistive device utilized: Environmental consultant - 4 wheeled Level of assistance: Modified independence and SBA Comments:   FUNCTIONAL TESTS:  Mini-BESTest  11/28  Timed Up and Go test:26 sec with 4WW (compared to 15 sec)   10 meter walk test:15.31 sec = 2.14 ft/sec  (compared to 2.01 ft/sec)   5 time sit to stand test: 23 sec-(compared to 11 sec)    PATIENT EDUCATION: Education details: assessment details, rationale of intervention Person educated: Patient Education method: Explanation Education comprehension: verbalized understanding  HOME EXERCISE PROGRAM: TBD-reports daily performance 20 reps standing PWR! moves  GOALS: Goals reviewed with patient? Yes  SHORT TERM GOALS: Target date: 04/18/2023    Patient will be independent in HEP to improve functional outcomes Baseline: pt demo standing PWR! Moves + sit to stand Goal status: MET 04/17/2023  2.  Demo improved BLE strength and balance per time 15 sec 5xSTS test Baseline: 23>15.41 sec 04/17/2023 Goal status: MET 04/17/2023  3.  Demo improved safety with ambulation per time 15 sec TUG test Baseline: 34.53 sec>29.53 sec Goal status: IN PROGRESS    LONG TERM GOALS: Target date: 05/16/2023    Improve hamstring flexibility to -15 degrees to reduce rigidity and enable greater knee extension ROM for mobility Baseline: -30 Goal status: IN  PROGRESS  2.  Reduce risk for falls per score 20/28 Mini-BESTest Baseline: 11/20 Goal status: IN PROGRESS  3.  Demo modified independent bed mobility to reduce level of assist from caregivers  Baseline: moderate assist  Goal status: IN PROGRESS  ASSESSMENT:  CLINICAL IMPRESSION: Initiated with HIIT on NU-step for rapid alternating movement intervals able to sustain 100+ SPM during 30 sec bouts. Continued with functional strengthening exercise to improve pulling power and for postural re-ed to mitigate rounded shoulders. Trunk flexion over BOS to improve sit to stand followed by resisted sit-stand transfers to improve power and ability to arise from lower seat height. Open chain PRE to improve LE isolation and quad/hip flexion strength for foot clearance in gait.  Tolerated session well and would do well to continue to progress general conditioning, strength, balance and motor control  OBJECTIVE IMPAIRMENTS: Abnormal gait, decreased activity tolerance, decreased balance, decreased coordination, decreased mobility, difficulty walking, decreased ROM, decreased strength, impaired flexibility, impaired tone, improper body mechanics, postural dysfunction, and pain.   ACTIVITY LIMITATIONS: carrying, lifting, bending, standing, squatting, stairs, transfers, bed mobility, reach over head, and locomotion level  PARTICIPATION LIMITATIONS: meal prep, cleaning, laundry, interpersonal relationship, shopping, community activity, yard work, and hobbies of music  PERSONAL FACTORS: Age, Past/current experiences, Time since onset of injury/illness/exacerbation, and 1-2 comorbidities: PMH  are also affecting patient's functional outcome.   REHAB POTENTIAL: Good  CLINICAL DECISION MAKING: Evolving/moderate complexity  EVALUATION COMPLEXITY: Moderate  PLAN:  PT FREQUENCY: 2x/week  PT DURATION: 8 weeks  PLANNED INTERVENTIONS: 97110-Therapeutic exercises, 97530- Therapeutic activity, [id number]- Neuromuscular  re-education, 97535- Self Care, 78295- Manual therapy, [phone]- Gait training, [phone]- Orthotic Fit/training, [phone]- Canalith repositioning, [phone]- Aquatic Therapy, Patient/Family education, Balance training, Stair training,  Taping, Dry Needling, Joint mobilization, Spinal mobilization, Vestibular training, DME instructions, Cryotherapy, and Moist heat  PLAN FOR NEXT SESSION: incorporate postural strengthening.  Seated leg strengthening, sit to stand from varied surfaces.  Review bed mobility techniques with wife present; review standing PWR moves HEP, supine power moves and stretching    2:59 PM, 05/01/23 M. Shary Decamp, PT, DPT Physical Therapist- [place] Office Number: [phone]

## 2023-05-01 NOTE — Therapy (Signed)
OUTPATIENT OCCUPATIONAL THERAPY PARKINSON'S Treatment Note  Patient Name: Daxx Martineau MRN: [id number] DOB:[birth year], 72 y.o., male Today's Date: 05/01/2023  PCP: Daisy Floro, MD REFERRING PROVIDER: Tat, Octaviano Batty, DO  END OF SESSION:  OT End of Session - 05/01/23 1453     Visit Number 7    Number of Visits 17    Date for OT Re-Evaluation 05/16/23    Authorization Type [employer]    OT Start Time 1450    OT Stop Time 1530    OT Time Calculation (min) 40 min                   Past Medical History:  Diagnosis Date   Anxiety    Arthritis    GERD (gastroesophageal reflux disease)    occ remote history   Hypertension    Parkinson disease (HCC)    Prostate cancer (HCC)    Right knee pain    questionable meniscal tear   Seasonal allergies    Past Surgical History:  Procedure Laterality Date   COLONOSCOPY     Leg Laceration Repair  Left    Age 84   PELVIC LYMPH NODE DISSECTION Bilateral 03/22/2018   Procedure: PELVIC LYMPH NODE DISSECTION;  Surgeon: Rene Paci, MD;  Location: [facility];  Service: Urology;  Laterality: Bilateral;   PROSTATE BIOPSY     ROBOT ASSISTED LAPAROSCOPIC RADICAL PROSTATECTOMY N/A 03/22/2018   Procedure: XI ROBOTIC ASSISTED LAPAROSCOPIC PROSTATECTOMY;  Surgeon: Rene Paci, MD;  Location: [facility];  Service: Urology;  Laterality: N/A;   TOTAL KNEE ARTHROPLASTY Right 05/05/2019   Procedure: RIGHT TOTAL KNEE ARTHROPLASTY;  Surgeon: Gean Birchwood, MD;  Location: [facility];  Service: Orthopedics;  Laterality: Right;   Patient Active Problem List   Diagnosis Date Noted   S/P TKR (total knee replacement), right 05/05/2019   Osteoarthritis of right knee 05/02/2019   Prostate cancer (HCC) 03/22/2018   Malignant neoplasm of prostate (HCC) 02/27/2018    ONSET DATE: referral date 03/06/23  REFERRING DIAG: G20.A2 (ICD-10-CM) - Parkinson's disease without dyskinesia, with fluctuating  manifestations  THERAPY DIAG:  Other symptoms and signs involving the nervous system  Unsteadiness on feet  Other lack of coordination  Muscle weakness (generalized)  Rationale for Evaluation and Treatment: Rehabilitation  SUBJECTIVE:   SUBJECTIVE STATEMENT: Pt reports working on strengthening of BLE during PT session. Pt accompanied by: self and significant other  PERTINENT HISTORY: PD, anxiety, arthritis, GERD, HTN, R TKR   PRECAUTIONS: Fall  WEIGHT BEARING RESTRICTIONS: No  PAIN:  Are you having pain? No  FALLS: Has patient fallen in last 6 months? Yes. Number of falls Pt reports "having a time where he fell frequently" but has fallen ~3 times in the last "few months" with most recent being Saturday.  LIVING ENVIRONMENT: Lives with: lives with their spouse Lives in: House/apartment Stairs:  Yes: Internal: full flight of step, but able to stay on main floor without need to navigate steps; and External: 2 steps; on right going up Has following equipment at home: Single point cane, Quad cane small base, Walker - 2 wheeled, Environmental consultant - 4 wheeled, Wheelchair (manual), shower chair, Grab bars, and transport chair - for dr appts  PLOF: Requires assistive device for independence and Needs assistance with ADLs  PATIENT GOALS: to be as close to normal as possible  OBJECTIVE:  Note: Objective measures were completed at Evaluation unless otherwise noted.  HAND DOMINANCE: Right  ADLs: Overall ADLs: reports overall slowing, wife  is "second pair of hands" Transfers/ambulation related to ADLs: utilizing small 4 wheeled walker for all mobility  Eating: wife assisting with cutting foods, pt spilling foods off utensil, constantly spilling things off the plate, having to use finger to stabilize food when scooping with R hand Grooming: utilizing electric toothbrush and water pick for oral care, difficulty with brushing hair  UB Dressing: primarily wearing t-shirts/pull over shirts,  occasional assist from wife if in a hurry or get stuck, no longer wearing button up shirts anymore.  Wife will assist with donning shirt if completing after shower LB Dressing: difficulty with reaching forward towards feet when donning pants, socks, shoes; increased difficulty when crossing leg , no longer wearing lace up shoes Toileting: requires momentum to stand up from toilet or will reach towards door for leverage/momentum, reports "struggle" with hygiene  Bathing: occasional assist for thoroughness when drying off  Tub Shower transfers: wife provides supervision, handle is just inside door and pt able to hold on to it while stepping over small ledge Equipment: Grab bars, Walk in shower, and Long handled sponge  IADLs: Spouse completes all shopping, meal prep, and housekeeping tasks. Community mobility: Relies on family or friends for transportation Medication management: pt fills pill box and is able to take correct dosages at correct time Financial management: wife completes and did prior Handwriting:  TBA  MOBILITY STATUS: Hx of falls and difficulty carrying objections with ambulation  POSTURE COMMENTS:  forward head  ACTIVITY TOLERANCE: Activity tolerance: diminished  FUNCTIONAL OUTCOME MEASURES: Physical performance test: PPT#2 (simulated eating) 25.91 & PPT#4 (donning/doffing jacket): TBA  COORDINATION: 9 Hole Peg test: Right: 1:36.03 sec; Left: 58.28 sec  UE ROM:   B shoulder flexion ~120 *, reports mild discomfort in R upper arm with internal rotation but able to achieve full ROM  UE MMT:    grossly 4/5 bilaterally  MUSCLE TONE: RUE: Mild and LUE: Mild  COGNITION: Overall cognitive status:  slower processing and speech  OBSERVATIONS: Bradykinesia   TODAY'S TREATMENT:                                                                                 DATE:  05/01/23 Cup stacking: with RUE and LUE, engaged in large amplitude reaching and stacking with picking up cups  from various planes with focus on large amplitude reaching and hand opening.  Pt benefiting from cues and demonstration for hand opening, especially with R hand.  Transitioned to flipping cups and stacking to incorporate internal/external rotation with large amplitude.  Pt with increased difficulty with hand placement on cups with LUE > RUE.  Coordination: engaged in picking up coins with RUE and LUE with focus on large amplitude picking up vs sliding off table.  Pt requiring sliding of coins intermittently, therefore encouraged to open fingers large even during sliding.  Pt placing coins into cup slot with cues for amplitude when pushing through slot. Sit > stand: pt requiring increased time and effort for sit > stand from standard chair without arm rests.  However when given cue for anterior weight shift and "power up" pt with improved ease and ability to stand without physical assistance.   04/26/23 Large amplitude:  engaged in open chest exercises in sitting and then in standing with focus on upright posture and large amplitude when reaching out to tap upright poles out to side and then tap together in front of pt.  Transitioned to reaching over head and tapping boom whackers overhead and then to ball at lower medline in front of pt.  OT providing pt with targets to facilitate increased amplitude and use of boom whackers for feedback.  Pt with decreased upright posture in standing due to reports of soreness in lower back. Large amplitude: pt completed finger flicks with elbow, wrist, and finger flexion prior to engaging in coordination task.  Completing finger flicks intermittently during coordination task with intermittent improvements observed. Coordination: with RUE, picking up various sized pegs and placing in to peg board.  Pt with increased difficulty with smaller pegs, therefore placing wash cloth under pegs to allow for increased friction to improve ease with picking up pegs.  Pt still with min  difficulty, however much improved with friction.  During removal of pegs, engaged in rotation of pegs 180*.  Pt with increased difficulty with shorter pegs.    04/17/23 Large amplitude: engaged in finger flicks with focus on full elbow and wrist extension and fingers open big.  Completed x10 with min cues for technique and amplitude. Forearm supination/pronation x10 with cues for full supination, followed by thumb opposition with focus on large amplitude. Coordination: w/ [employer] UE including: flipping cards one at a time off a deck, picking up coins and placing into container, and picking up 4 coins 1 at a time and translating palm to fingertips to place into coin slot.  Pt completed coins with RUE and then LUE with improved ease with picking up coins one at a time during 2nd attempt with L.  Pt with difficulty with translation of coins from palm to finger tips bilaterally, L > R, therefore terminated task on L side after just one.  OT providing demonstration and cues for large amplitude and forearm supination when flipping and releasing cards with RUE.  Completed card flipping in standing with CGA for standing balance.    PATIENT EDUCATION: Education details: condition specific education, large amplitude exercises Person educated: Patient Education method: Explanation Education comprehension: needs further education  HOME EXERCISE PROGRAM: Access Code: [email or web address] URL: [email or web address] Date: 04/10/2023 Prepared by: [facility] - Outpatient  Rehab - [facility]  Exercises - Seated Shoulder Shrugs  - 2 x daily - 5 reps - Seated Scapular Retraction  - 2 x daily - 5 reps - Seated Finger Flicks with Elbow Extension  - 1 x daily - 2 sets - 5-10 reps - Seated Reaching to Side and Across Body  - 1 x daily - 2 sets - 5-10 reps - Seated Reaching to Side  - 1 x daily - 2 sets - 5-10 reps  GOALS: Goals reviewed with patient? No  SHORT TERM GOALS: Target date: 04/18/23  Pt  will be Independent with PD specific HEP. Baseline: Goal status: IN PROGRESS  2.  Pt will demonstrate improved shoulder flexion bilaterally to retreive a lightweight object from overhead shelf with BUE at >/= 130 shoulder flexion.  Baseline:  Goal status: IN PROGRESS  3.  Pt will verbalize understanding of adapted strategies and DME/AE PRN (reacher, BSC, shower chair, rocker knife, scoop plate/plate guard, etc) to maximize safety and independence with ADLs/IADLs.  Baseline:  Goal status: IN PROGRESS  4.  Pt will report understanding of modifications and adaptive strategies to improve  handwriting legibility and ease. Baseline:  Goal status: IN PROGRESS   LONG TERM GOALS: Target date: 05/16/23  Pt will demonstrate increased functional ROM and use of BUE to aid in clothing management and hygiene with toileting tasks.  Baseline:  Goal status: IN PROGRESS  2.  Pt will demonstrate improved ability to get up from lower seat/toilet without assistance with DME PRN. Baseline:  Goal status: IN PROGRESS  3.  Pt will demonstrate improved fine motor coordination for ADLs as evidenced by decreasing 9 hole peg test score for RUE by 10 secs on R and 1 or fewer drops. Baseline: R: 1:36 and L: 58 sec.  Pt reports dropping small items frequently Goal status: IN PROGRESS  4.  Pt will demonstrate improved ease with feeding as evidenced by decreasing PPT#2 (self feeding) by 5 secs  Baseline: 25.91 Goal status: IN PROGRESS  5.  Pt will demonstrate and/or report improved independence with LB dressing with use of AE/DME as needed.  Baseline:  Goal status: IN PROGRESS  ASSESSMENT:  CLINICAL IMPRESSION: Pt continues to benefit from cues for large amplitude during functional reach and coordination tasks.  Pt with intermittent spontaneous use of finger flicks during coordination tasks, however with minimal impact on picking up coins - especially pennies.  PERFORMANCE DEFICITS: in functional skills  including ADLs, IADLs, coordination, ROM, strength, flexibility, Fine motor control, Gross motor control, balance, body mechanics, endurance, decreased knowledge of precautions, decreased knowledge of use of DME, and UE functional use and psychosocial skills including coping strategies, environmental adaptation, and routines and behaviors.   IMPAIRMENTS: are limiting patient from ADLs, IADLs, and rest and sleep.     PLAN:  OT FREQUENCY: 2x/week  OT DURATION: 8 weeks  PLANNED INTERVENTIONS: 97168 OT Re-evaluation, 97535 self care/ADL training, 96295 therapeutic exercise, 97530 therapeutic activity, 97112 neuromuscular re-education, 97035 ultrasound, 97010 moist heat, 97010 cryotherapy, functional mobility training, psychosocial skills training, energy conservation, coping strategies training, patient/family education, and DME and/or AE instructions  RECOMMENDED OTHER SERVICES: NA  CONSULTED AND AGREED WITH PLAN OF CARE: Patient  PLAN FOR NEXT SESSION: Review and add to large amplitude exercises, initiate bag exercises with focus on improved shoulder ROM, FMC tasks/HEP, Focus on sit > stand and standing balance   Toia Micale, OTR/L 05/01/2023, 2:54 PM   [facility] at [facility] [address], [address] [place], [place] [address] Phone # [phone] Fax # [phone]

## 2023-05-03 ENCOUNTER — Ambulatory Visit: Payer: PPO | Admitting: Occupational Therapy

## 2023-05-03 ENCOUNTER — Ambulatory Visit: Payer: PPO | Admitting: Physical Therapy

## 2023-05-03 ENCOUNTER — Encounter: Payer: Self-pay | Admitting: Physical Therapy

## 2023-05-03 ENCOUNTER — Ambulatory Visit: Payer: PPO

## 2023-05-03 DIAGNOSIS — R278 Other lack of coordination: Secondary | ICD-10-CM

## 2023-05-03 DIAGNOSIS — R41841 Cognitive communication deficit: Secondary | ICD-10-CM

## 2023-05-03 DIAGNOSIS — R2681 Unsteadiness on feet: Secondary | ICD-10-CM

## 2023-05-03 DIAGNOSIS — R471 Dysarthria and anarthria: Secondary | ICD-10-CM

## 2023-05-03 DIAGNOSIS — R2689 Other abnormalities of gait and mobility: Secondary | ICD-10-CM

## 2023-05-03 DIAGNOSIS — R29818 Other symptoms and signs involving the nervous system: Secondary | ICD-10-CM

## 2023-05-03 DIAGNOSIS — M6281 Muscle weakness (generalized): Secondary | ICD-10-CM

## 2023-05-03 NOTE — Therapy (Signed)
OUTPATIENT PHYSICAL THERAPY NEURO TREATMENT/10th VISIT PROGRESS NOTE   Patient Name: Matthew [name] MRN: [id number] DOB:1950/05/29, 72 y.o., male Today's Date: 05/03/2023   PCP: Daisy Floro, MD REFERRING PROVIDER: Vladimir Faster, DO   Progress Note Reporting Period 03/21/2023 to 05/03/2023  See note below for Objective Data and Assessment of Progress/Goals.     END OF SESSION:  PT End of Session - 05/03/23 1441     Visit Number 10    Number of Visits 17    Date for PT Re-Evaluation 05/16/23    Authorization Type [employer]    Progress Note Due on Visit 10    PT Start Time 1448    Activity Tolerance Patient tolerated treatment well    Behavior During Therapy [facility] for tasks assessed/performed                  Past Medical History:  Diagnosis Date   Anxiety    Arthritis    GERD (gastroesophageal reflux disease)    occ remote history   Hypertension    Parkinson disease (HCC)    Prostate cancer (HCC)    Right knee pain    questionable meniscal tear   Seasonal allergies    Past Surgical History:  Procedure Laterality Date   COLONOSCOPY     Leg Laceration Repair  Left    Age 66   PELVIC LYMPH NODE DISSECTION Bilateral 03/22/2018   Procedure: PELVIC LYMPH NODE DISSECTION;  Surgeon: Rene Paci, MD;  Location: [facility];  Service: Urology;  Laterality: Bilateral;   PROSTATE BIOPSY     ROBOT ASSISTED LAPAROSCOPIC RADICAL PROSTATECTOMY N/A 03/22/2018   Procedure: XI ROBOTIC ASSISTED LAPAROSCOPIC PROSTATECTOMY;  Surgeon: Rene Paci, MD;  Location: [facility];  Service: Urology;  Laterality: N/A;   TOTAL KNEE ARTHROPLASTY Right 05/05/2019   Procedure: RIGHT TOTAL KNEE ARTHROPLASTY;  Surgeon: Gean Birchwood, MD;  Location: [facility];  Service: Orthopedics;  Laterality: Right;   Patient Active Problem List   Diagnosis Date Noted   S/P TKR (total knee replacement), right 05/05/2019   Osteoarthritis of right knee 05/02/2019    Prostate cancer (HCC) 03/22/2018   Malignant neoplasm of prostate (HCC) 02/27/2018    ONSET DATE: PD 2021  REFERRING DIAG: G20.A2 (ICD-10-CM) - Parkinson's disease without dyskinesia, with fluctuating manifestations (HCC)   THERAPY DIAG:  Muscle weakness (generalized)  Unsteadiness on feet  Other abnormalities of gait and mobility  Rationale for Evaluation and Treatment: Rehabilitation  SUBJECTIVE:  SUBJECTIVE STATEMENT: Got up by myself out of the bed this morning.  Surprised my wife.  Maybe balance is a slight bit better.   Pt accompanied by: self, wife  PERTINENT HISTORY: PD, R TKR, prostate CA, HTN  PAIN:  Are you having pain? Yes: NPRS scale: 0/10 Pain location: upper back/lower back "it moves" Bilateral shoulders intermittently painful Pain description: hard to describe Aggravating factors: certain movements/times Relieving factors: rest  PRECAUTIONS: Fall  RED FLAGS: None   WEIGHT BEARING RESTRICTIONS: No  FALLS: Has patient fallen in last 6 months? Yes. Number of falls "several"  LIVING ENVIRONMENT: Lives with: lives with their family Lives in: House/apartment Stairs: Yes, 2-3 steps to enter Has following equipment at home: Dan Humphreys - 4 wheeled and hospital bed  PLOF: Needs assistance with ADLs, Needs assistance with transfers, and bed mobility  PATIENT GOALS: improve balance, mobility, strength. "Get as normal as I can". Be able to step up into son's Jeep  OBJECTIVE:    TODAY'S TREATMENT: 05/03/2023 Activity Comments  NuStep, Level 4, 4 extremities, x 8 minutes 30 sec 90+ SPM; 30 sec 70-80 SPM. Cues for change of pace  FTSTS:  10.97 sec   TUG  26.53 sec, 27.88 sec      Posterior push and release Posterior step and return to midline, 10 reps x 2 UE support at  parallel bars  Standing feet together EO 30 sec On Airex feet together EC 30 sec On incline feet apart EC 30 sec Min guard, mild>mod sway  Seated posture ex: -yellow band resisted seated PWR! Up x 5 -seated scapular retraction yellow band, 3 x 5 reps    PATIENT EDUCATION: Education details: Discussed ongoing community fitness options-including trying to get back into [employer] Tuesday seated PWR! Moves class Person educated: Patient Education method: Explanation Education comprehension: verbalized understanding   HOME EXERCISE PROGRAM Last updated: 03/27/23 Supine PWR! Moves 10x each daily   Access Code: [email or web address] URL: [email or web address] Date: 04/03/2023 Prepared by: [facility] - Outpatient  Rehab - [facility]  Exercises - Supine Hamstring Stretch with Strap  - 1 x daily - 5 x weekly - 2 sets - 30 sec hold - Supine Piriformis Stretch with Foot on Ground  - 1 x daily - 5 x weekly - 2 sets - 30 sec hold - Supine Figure 4 Piriformis Stretch  - 1 x daily - 5 x weekly - 2 sets - 30 sec hold    Note: Objective measures were completed at Evaluation unless otherwise noted.  DIAGNOSTIC FINDINGS: n/a for this episode  COGNITION: Overall cognitive status:  notes increased issues with memory   SENSATION: Not tested, denies parasthesias  COORDINATION: Deficits with rapid alternating movements Finger to nose intact    MUSCLE TONE: BLE rigidity  MUSCLE LENGTH: Hamstrings: Right -30 deg; Left -30 deg   DTRs:  NT  POSTURE: rounded shoulders, forward head, increased thoracic kyphosis, and flexed trunk   LOWER EXTREMITY ROM:     Active  Right Eval Left Eval  Hip flexion    Hip extension    Hip abduction    Hip adduction    Hip internal rotation    Hip external rotation    Knee flexion    Knee extension -30 -30  Ankle dorsiflexion 10 10  Ankle plantarflexion    Ankle inversion    Ankle eversion     (Blank rows = not tested)  LOWER EXTREMITY  MMT:    BLE 4/5 gross strength  BED MOBILITY:  Moderate assist  TRANSFERS: Assistive device utilized: Environmental consultant - 4 wheeled  Sit to stand: Complete Independence and Modified independence Stand to sit: Complete Independence Chair to chair: Complete Independence and Modified independence Floor:  NT    CURB:  Level of Assistance: CGA Assistive device utilized: Environmental consultant - 4 wheeled Curb Comments:   STAIRS: Reports stairs to 2nd floor but does not use 2-3 stairs to enter home  GAIT: Gait pattern: knee flexed in stance- Right and knee flexed in stance- Left Distance walked:  Assistive device utilized: Environmental consultant - 4 wheeled Level of assistance: Modified independence and SBA Comments:   FUNCTIONAL TESTS:  Mini-BESTest  11/28  Timed Up and Go test:26 sec with 4WW (compared to 15 sec)   10 meter walk test:15.31 sec = 2.14 ft/sec  (compared to 2.01 ft/sec)   5 time sit to stand test: 23 sec-(compared to 11 sec)    PATIENT EDUCATION: Education details: assessment details, rationale of intervention Person educated: Patient Education method: Explanation Education comprehension: verbalized understanding  HOME EXERCISE PROGRAM: TBD-reports daily performance 20 reps standing PWR! moves  GOALS: Goals reviewed with patient? Yes  SHORT TERM GOALS: Target date: 04/18/2023    Patient will be independent in HEP to improve functional outcomes Baseline: pt demo standing PWR! Moves + sit to stand Goal status: MET 04/17/2023  2.  Demo improved BLE strength and balance per time 15 sec 5xSTS test Baseline: 23>15.41 sec 04/17/2023 Goal status: MET 04/17/2023  3.  Demo improved safety with ambulation per time 15 sec TUG test Baseline: 34.53 sec>29.53 sec Goal status: IN PROGRESS    LONG TERM GOALS: Target date: 05/16/2023    Improve hamstring flexibility to -15 degrees to reduce rigidity and enable greater knee extension ROM for mobility Baseline: -30 Goal status: IN  PROGRESS  2.  Reduce risk for falls per score 20/28 Mini-BESTest Baseline: 11/20 Goal status: IN PROGRESS  3.  Demo modified independent bed mobility to reduce level of assist from caregivers  Baseline: moderate assist  Goal status: IN PROGRESS  ASSESSMENT:  CLINICAL IMPRESSION: 10th Visit PN:  Subjectively, pt reports he feels some slight improvement with balance; he does note he was able to get out of bed on his own today.  Objective measures:  FTSTS 10.97 and TUG score 26.5 sec, both of which have improved since STG check and from eval.  Focus of PT sessions have been on functional strengthening, functional mobility strategies for improved bed mobility, large amplitude movement patterns to improve posture and balance.  He is still very hesitant with working in posterior direction for balance recovery.  He will continue to improve from skilled PT to further progress balance, strength, and gait for overall improved functional mobility.  OBJECTIVE IMPAIRMENTS: Abnormal gait, decreased activity tolerance, decreased balance, decreased coordination, decreased mobility, difficulty walking, decreased ROM, decreased strength, impaired flexibility, impaired tone, improper body mechanics, postural dysfunction, and pain.   ACTIVITY LIMITATIONS: carrying, lifting, bending, standing, squatting, stairs, transfers, bed mobility, reach over head, and locomotion level  PARTICIPATION LIMITATIONS: meal prep, cleaning, laundry, interpersonal relationship, shopping, community activity, yard work, and hobbies of music  PERSONAL FACTORS: Age, Past/current experiences, Time since onset of injury/illness/exacerbation, and 1-2 comorbidities: PMH  are also affecting patient's functional outcome.   REHAB POTENTIAL: Good  CLINICAL DECISION MAKING: Evolving/moderate complexity  EVALUATION COMPLEXITY: Moderate  PLAN:  PT FREQUENCY: 2x/week  PT DURATION: 8 weeks  PLANNED INTERVENTIONS: 97110-Therapeutic  exercises, 97530- Therapeutic activity, [id number]- Neuromuscular re-education, 97535- Self Care, [id number]- Manual  therapy, [phone]- Gait training, 60454- Orthotic Fit/training, 09811- Canalith repositioning, 91478- Aquatic Therapy, Patient/Family education, Balance training, Stair training, Taping, Dry Needling, Joint mobilization, Spinal mobilization, Vestibular training, DME instructions, Cryotherapy, and Moist heat  PLAN FOR NEXT SESSION: Continue to incorporate postural strengthening.  Seated leg strengthening, sit to stand from varied surfaces.  Review bed mobility techniques with wife present; review standing PWR moves HEP, supine power moves and stretching.  Work towards LTGS and make sure pt has an exercise plan for when PT discharges (emphasize [employer] Tuesday pm PWR! Moves as an option)    Lonia Blood, PT 05/03/23 2:48 PM Phone: [phone] Fax: [phone]  [facility] at [facility] [address] [place], [place] [address] Phone # [phone] Fax # [phone]

## 2023-05-03 NOTE — Therapy (Signed)
OUTPATIENT SPEECH LANGUAGE PATHOLOGY PARKINSON'S TREATMENT   Patient Name: Matthew [name] MRN: [id number] DOB:10/16/1950, 72 y.o., male Today's Date: 05/03/2023  PCP: Gildardo Cranker, MD REFERRING PROVIDER: Kerin Salen, DO  END OF SESSION:    Past Medical History:  Diagnosis Date   Anxiety    Arthritis    GERD (gastroesophageal reflux disease)    occ remote history   Hypertension    Parkinson disease (HCC)    Prostate cancer (HCC)    Right knee pain    questionable meniscal tear   Seasonal allergies    Past Surgical History:  Procedure Laterality Date   COLONOSCOPY     Leg Laceration Repair  Left    Age 52   PELVIC LYMPH NODE DISSECTION Bilateral 03/22/2018   Procedure: PELVIC LYMPH NODE DISSECTION;  Surgeon: Rene Paci, MD;  Location: [facility];  Service: Urology;  Laterality: Bilateral;   PROSTATE BIOPSY     ROBOT ASSISTED LAPAROSCOPIC RADICAL PROSTATECTOMY N/A 03/22/2018   Procedure: XI ROBOTIC ASSISTED LAPAROSCOPIC PROSTATECTOMY;  Surgeon: Rene Paci, MD;  Location: [facility];  Service: Urology;  Laterality: N/A;   TOTAL KNEE ARTHROPLASTY Right 05/05/2019   Procedure: RIGHT TOTAL KNEE ARTHROPLASTY;  Surgeon: Gean Birchwood, MD;  Location: [facility];  Service: Orthopedics;  Laterality: Right;   Patient Active Problem List   Diagnosis Date Noted   S/P TKR (total knee replacement), right 05/05/2019   Osteoarthritis of right knee 05/02/2019   Prostate cancer (HCC) 03/22/2018   Malignant neoplasm of prostate (HCC) 02/27/2018    ONSET DATE: script dated 03-06-23  REFERRING DIAG: Parkinson's Disease  THERAPY DIAG:  No diagnosis found.  Rationale for Evaluation and Treatment: Rehabilitation  SUBJECTIVE:   SUBJECTIVE STATEMENT: "I've had a pretty good day." Pt accompanied by: self  PERTINENT HISTORY: Pt well-known to this SLP from previous courses of ST. MBS in August 2023- results below. Pt's last ST course resulted in pt partially meeting  goal for producing speech in upper 60s dB in simple to mod complex with occasional min A.  PAIN:  Are you having pain? No  FALLS: Has patient fallen in last 6 months?  See PT evaluation for details  PATIENT GOALS: Improve loudness and fluency  OBJECTIVE:   DIAGNOSTIC FINDINGS:  MBS 12-22-21 Patient presents with functional oropharyngeal swallowing without aspiration of any consistency tested. He does demonstrate minimal asymptomatic laryngeal penetration of thin liquid due to decreased timing of laryngeal closure. Chin tuck posture with use of straw effective to prevent penetration. Pharyngeal swallow is strong without retention. Oral transiting discoordination noted with pt swallowing barium tablet as he required 2nd bolus of thin to transit through oropharynx. Upon esophageal sweep, pt appeared with barium tablet retained in esophagus with pt awareness. Tablet appeared to transit distally after several boluses of liquids. After tablet had transited, pt continued with "phantom" sensation to esophagus. Recommend pt continue diet as tolerated, using chin tuck with liquids if improves comfort, decreases cough. Also encouraged pt to assure maintains strength of voice, cough and expectoration for airway protection.    PATIENT REPORTED OUTCOME MEASURES (PROM): Communication Effectiveness Survey: pt returned CES on 04/12/23 with score of 18/40, with lower scores indicating more impact of pt's deficits on daily communication situations.   TODAY'S TREATMENT:  DATE:  05/03/23: SLP and pt engaged in conversation for ~5 minute conversational segments x5 today, pt maintained average of 68dB with occasional min A for loudness. Pt endorses his loudness is better at home in the last week.   04/26/23: Pt talked with SLP in simple conversation for 15 minutes with rare min A for  loudness. Loud /a/ targeted today to make louder speech more habitual with average loudness 83dB, with rare min A for loudness. Everyday sentences read with average loudness 80dB with rare min-mod cues for loudness. Conversation afterwards (simple to mod complex) of 15 minutes averaged 67dB with occasional min A for loudness/effort.  04/24/23: Pt taking his PD meds just prior to ST. Pt states he performed loud /a/ and sentences 5/7 days. Loud /a/ targeted today to make louder speech more habitual. Average loudness 85dB, with rare incr'd to occasional min A for loudness. Everyday sentences read with average loudness 80dB with occasional min-mod cues for loudness. Pt req'd usual min-mod cues for loudness in 3-4 minute conversational segments. SLP encouraged pt to cont to practice consistently.   04/17/23: Pt states family is asking him less frequently for repeats. Today in short conversation segments of 3 minutes, pt maintained upper 60s dB average for 4 segments and then req'd incr'ing level of cueing to occasional min-mod A by 7th segment. SLP encouraged pt to cont to practice at home.   04/12/23: Loud /a/ targeted today to make louder speech more habitual. Average loudness 88dB, with usual min A for loudness. Everyday sentences read with average loudness 82dB with usual mod cues for loudness and tactile cues of abdominal recruitment. In sentence responses pt req'd min-mod A for loudness, occasionally. Pt appeared fatigued from previous therapies today and told SLP affirmatively he was fatigued when SLP asked him. SLP provided homework for sentence responses. CES returned today - results above.   04/10/23: Loud /a/ targeted today to make louder speech more habitual. Average loudness 85dB. Everyday sentences read with average loudness 83dB. SLP engaged in conversational topics (3-4 minutes) x5; SLP req'd to provide min-mod cues, rarely - which increased to mod cues occasionally by last topic. Pt acknowledged  he was fatigued from PT where they worked on bed mobility. Pt provided 2 sentence responses with average 65dB with rare min-mod A for loudness. Other multiple sentence responses were challenging today due to decr'd divided attention negatively impacting pt performance today more than previous sessions, likely due to fatigue factor. SLP encouraged pt to cont to practice at home. "I always practice with my grandkids because I don't want them to have to ask me to repeat like [name] does."  04/03/23: SLP engaged pt with loud /a/ to encourage muscle memory to make louder speech more habitual. Average loudness 85dB. Initial cues for loudness but pt independent after that. Everyday sentences were read with average 81dB.  Word level responses with higher cognitive load completed with average 70dB.  03/27/23: Pt arrived and greeted SLP with 70dB average, and then decr'd to a soft volume once he sat down in ST room (average 64dB). Initial cues for louder speech and tactile cue for feeling strength in his voice from abdomen, thinking about effort for /a/.  SLP engaged pt in conversation for 38 minutes, with conversational segments of 8-13 minutes, x4. SLP req'd occasional mod cues for pt to improve loudness, faded to occasional min-mod cues. Pt demonstrated decr'd stamina approx 32 minutes into conversational segments. SLP reiterated the necessity to perform loud /a/ at home as prescribed  when pt went to lobby to meet wife.   03/21/23 (eval): SPEECH: SLP explained eval results to pt. SLP ensured pt knew the importance of daily loud /a/ and everyday sentences practice BID.  SWALLOWING: Additionally, he and SLP practiced chin down posture with a straw and with a cup.  PATIENT EDUCATION: Education details: see "today's treatment" Person educated: Patient Education method: Explanation and Demonstration Education comprehension: verbalized understanding  HOME EXERCISE PROGRAM: Loud /a/ and everyday sentences.     GOALS: Goals reviewed with patient? No  SHORT TERM GOALS: Target date: 04/20/23  Produce /a/ with average mid-upper 80s dB over three sessions Baseline: 04/12/23 Goal status: Partially met  2.  pt will demo abdominal breathing with sentence responses 85% accuracy x 3 sessions  Baseline:  Goal status: not addressed yet  3.  pt will generate 2 minutes simple conversation with upper 60s dB average in 2 sessions  Baseline: 04/17/23 Goal status: Partially met  4.  Pt will use strategies for dysfluency in 1-2 minute short conversational segments  Baseline:  Goal status: deferred - dysfluency not heard in sessions thus far   LONG TERM GOALS: Target date: 05/20/23  pt will produce average upper 60s dB in 10 minutes simple to mod complex conversation in 5 sessions  Baseline:  Goal status: INITIAL  2.  pt will maintain upper 60s dB average in 10 minute simple conversation x 3 sessions  Baseline: 04/26/23 Goal status: INITIAL  3.  pt will use abdominal breathing >80% of the time during 10 minute simple conversation in 3 sessions  Baseline: 04/26/23 Goal status: INITIAL  4.  pt will report fewer requests to repeat himself than prior to ST in 3 sessions  Baseline:  Goal status: INITIAL  5.  Pt will use strategies for safer swallowing in 3 sessions with modified independence  Baseline:  Goal status: INITIAL  6.  Pt will score higher/better on PROM in the last 2 weeks of therapy Baseline:  Goal status: INITIAL  ASSESSMENT:  CLINICAL IMPRESSION: Patient is a 72 y.o. male who was seen today for treatment of dysarthria in light of Parkinsons Disease. See "today's treatment" for more details. Pt told SLP on eval date that family members ask him to repeat frequently, and he frequency of communicating in social situations has decr'd since last session of previous therapy course. Pt was able to improve loudness in simple conversation for 3 minutes and thus is an excellent candidate  for ST.  Additionally when pt used chin tuck posture with liquids as told on MBS in August 2023, he felt safer.  OBJECTIVE IMPAIRMENTS: Objective impairments include executive functioning, aphasia, dysarthria, and dysphagia. These impairments are limiting patient from household responsibilities, ADLs/IADLs, effectively communicating at home and in community, and safety when swallowing.Factors affecting potential to achieve goals and functional outcome are  none .Marland Kitchen Patient will benefit from skilled SLP services to address above impairments and improve overall function.  REHAB POTENTIAL: Good  PLAN:  SLP FREQUENCY: 2x/week  SLP DURATION: 8 weeks  PLANNED INTERVENTIONS: Aspiration precaution training, Diet toleration management , Environmental controls, Cueing hierachy, Cognitive reorganization, Internal/external aids, Oral motor exercises, Functional tasks, Multimodal communication approach, SLP instruction and feedback, Compensatory strategies, and Patient/family education    [facility], CCC-SLP 05/03/2023, 5:47 PM

## 2023-05-03 NOTE — Therapy (Signed)
OUTPATIENT OCCUPATIONAL THERAPY PARKINSON'S Treatment Note  Patient Name: Carlee Gallenstein MRN: [id number] DOB:08-25-1950, 72 y.o., male Today's Date: 05/03/2023  PCP: Daisy Floro, MD REFERRING PROVIDER: Tat, Octaviano Batty, DO  END OF SESSION:  OT End of Session - 05/03/23 1413     Visit Number 8    Number of Visits 17    Date for OT Re-Evaluation 05/16/23    Authorization Type [employer]    OT Start Time 1407    OT Stop Time 1447    OT Time Calculation (min) 40 min                    Past Medical History:  Diagnosis Date   Anxiety    Arthritis    GERD (gastroesophageal reflux disease)    occ remote history   Hypertension    Parkinson disease (HCC)    Prostate cancer (HCC)    Right knee pain    questionable meniscal tear   Seasonal allergies    Past Surgical History:  Procedure Laterality Date   COLONOSCOPY     Leg Laceration Repair  Left    Age 39   PELVIC LYMPH NODE DISSECTION Bilateral 03/22/2018   Procedure: PELVIC LYMPH NODE DISSECTION;  Surgeon: Rene Paci, MD;  Location: [facility];  Service: Urology;  Laterality: Bilateral;   PROSTATE BIOPSY     ROBOT ASSISTED LAPAROSCOPIC RADICAL PROSTATECTOMY N/A 03/22/2018   Procedure: XI ROBOTIC ASSISTED LAPAROSCOPIC PROSTATECTOMY;  Surgeon: Rene Paci, MD;  Location: [facility];  Service: Urology;  Laterality: N/A;   TOTAL KNEE ARTHROPLASTY Right 05/05/2019   Procedure: RIGHT TOTAL KNEE ARTHROPLASTY;  Surgeon: Gean Birchwood, MD;  Location: [facility];  Service: Orthopedics;  Laterality: Right;   Patient Active Problem List   Diagnosis Date Noted   S/P TKR (total knee replacement), right 05/05/2019   Osteoarthritis of right knee 05/02/2019   Prostate cancer (HCC) 03/22/2018   Malignant neoplasm of prostate (HCC) 02/27/2018    ONSET DATE: referral date 03/06/23  REFERRING DIAG: G20.A2 (ICD-10-CM) - Parkinson's disease without dyskinesia, with fluctuating  manifestations  THERAPY DIAG:  Other symptoms and signs involving the nervous system  Muscle weakness (generalized)  Unsteadiness on feet  Other lack of coordination  Rationale for Evaluation and Treatment: Rehabilitation  SUBJECTIVE:   SUBJECTIVE STATEMENT: Pt reports plans to go to a friends house tonight to listen to a musical performance. Pt accompanied by: self and significant other  PERTINENT HISTORY: PD, anxiety, arthritis, GERD, HTN, R TKR   PRECAUTIONS: Fall  WEIGHT BEARING RESTRICTIONS: No  PAIN:  Are you having pain? No  FALLS: Has patient fallen in last 6 months? Yes. Number of falls Pt reports "having a time where he fell frequently" but has fallen ~3 times in the last "few months" with most recent being Saturday.  LIVING ENVIRONMENT: Lives with: lives with their spouse Lives in: House/apartment Stairs:  Yes: Internal: full flight of step, but able to stay on main floor without need to navigate steps; and External: 2 steps; on right going up Has following equipment at home: Single point cane, Quad cane small base, Walker - 2 wheeled, Environmental consultant - 4 wheeled, Wheelchair (manual), shower chair, Grab bars, and transport chair - for dr appts  PLOF: Requires assistive device for independence and Needs assistance with ADLs  PATIENT GOALS: to be as close to normal as possible  OBJECTIVE:  Note: Objective measures were completed at Evaluation unless otherwise noted.  HAND DOMINANCE: Right  ADLs: Overall ADLs: reports overall slowing, wife is "second pair of hands" Transfers/ambulation related to ADLs: utilizing small 4 wheeled walker for all mobility  Eating: wife assisting with cutting foods, pt spilling foods off utensil, constantly spilling things off the plate, having to use finger to stabilize food when scooping with R hand Grooming: utilizing electric toothbrush and water pick for oral care, difficulty with brushing hair  UB Dressing: primarily wearing  t-shirts/pull over shirts, occasional assist from wife if in a hurry or get stuck, no longer wearing button up shirts anymore.  Wife will assist with donning shirt if completing after shower LB Dressing: difficulty with reaching forward towards feet when donning pants, socks, shoes; increased difficulty when crossing leg , no longer wearing lace up shoes Toileting: requires momentum to stand up from toilet or will reach towards door for leverage/momentum, reports "struggle" with hygiene  Bathing: occasional assist for thoroughness when drying off  Tub Shower transfers: wife provides supervision, handle is just inside door and pt able to hold on to it while stepping over small ledge Equipment: Grab bars, Walk in shower, and Long handled sponge  IADLs: Spouse completes all shopping, meal prep, and housekeeping tasks. Community mobility: Relies on family or friends for transportation Medication management: pt fills pill box and is able to take correct dosages at correct time Financial management: wife completes and did prior Handwriting:  TBA  MOBILITY STATUS: Hx of falls and difficulty carrying objections with ambulation  POSTURE COMMENTS:  forward head  ACTIVITY TOLERANCE: Activity tolerance: diminished  FUNCTIONAL OUTCOME MEASURES: Physical performance test: PPT#2 (simulated eating) 25.91 & PPT#4 (donning/doffing jacket): TBA  COORDINATION: 9 Hole Peg test: Right: 1:36.03 sec; Left: 58.28 sec  UE ROM:   B shoulder flexion ~120 *, reports mild discomfort in R upper arm with internal rotation but able to achieve full ROM  UE MMT:    grossly 4/5 bilaterally  MUSCLE TONE: RUE: Mild and LUE: Mild  COGNITION: Overall cognitive status:  slower processing and speech  OBSERVATIONS: Bradykinesia   TODAY'S TREATMENT:                                                                                 DATE:  05/03/23 Resistance Clothespins 1,2,4,6# with RUE and LUE for mid and high  functional reaching and sustained pinch while in standing. OT providing cues for upright standing posture and large amplitude reaching.  Pt demonstrating difficulty with grasping and manipulating clothespins R>L.  Pt with decreased reach due to rounded posture and limited shoulder ROM, however with cues pt with improved upright standing posture.  Transitioned to completing in sitting with increased focus on upright sitting posture and grasp.  Pt demonstrating improved ease and coordination of grasp and manipulation of clothespins when sitting.  Pt still demonstrating decreased shoulder flexion when placing clothespins on vertical dowel. Sit > stand: pt completing sit > stand from mat table with good amplitude and ease throughout session.   05/01/23 Cup stacking: with RUE and LUE, engaged in large amplitude reaching and stacking with picking up cups from various planes with focus on large amplitude reaching and hand opening.  Pt benefiting from cues and demonstration for hand  opening, especially with R hand.  Transitioned to flipping cups and stacking to incorporate internal/external rotation with large amplitude.  Pt with increased difficulty with hand placement on cups with LUE > RUE.  Coordination: engaged in picking up coins with RUE and LUE with focus on large amplitude picking up vs sliding off table.  Pt requiring sliding of coins intermittently, therefore encouraged to open fingers large even during sliding.  Pt placing coins into cup slot with cues for amplitude when pushing through slot. Sit > stand: pt requiring increased time and effort for sit > stand from standard chair without arm rests.  However when given cue for anterior weight shift and "power up" pt with improved ease and ability to stand without physical assistance.   04/26/23 Large amplitude: engaged in open chest exercises in sitting and then in standing with focus on upright posture and large amplitude when reaching out to tap  upright poles out to side and then tap together in front of pt.  Transitioned to reaching over head and tapping boom whackers overhead and then to ball at lower medline in front of pt.  OT providing pt with targets to facilitate increased amplitude and use of boom whackers for feedback.  Pt with decreased upright posture in standing due to reports of soreness in lower back. Large amplitude: pt completed finger flicks with elbow, wrist, and finger flexion prior to engaging in coordination task.  Completing finger flicks intermittently during coordination task with intermittent improvements observed. Coordination: with RUE, picking up various sized pegs and placing in to peg board.  Pt with increased difficulty with smaller pegs, therefore placing wash cloth under pegs to allow for increased friction to improve ease with picking up pegs.  Pt still with min difficulty, however much improved with friction.  During removal of pegs, engaged in rotation of pegs 180*.  Pt with increased difficulty with shorter pegs.   PATIENT EDUCATION: Education details: condition specific education, large amplitude exercises Person educated: Patient Education method: Explanation Education comprehension: needs further education  HOME EXERCISE PROGRAM: Access Code: [email or web address] URL: [email or web address] Date: 04/10/2023 Prepared by: [facility] - Outpatient  Rehab - [facility]  Exercises - Seated Shoulder Shrugs  - 2 x daily - 5 reps - Seated Scapular Retraction  - 2 x daily - 5 reps - Seated Finger Flicks with Elbow Extension  - 1 x daily - 2 sets - 5-10 reps - Seated Reaching to Side and Across Body  - 1 x daily - 2 sets - 5-10 reps - Seated Reaching to Side  - 1 x daily - 2 sets - 5-10 reps  GOALS: Goals reviewed with patient? No  SHORT TERM GOALS: Target date: 04/18/23  Pt will be Independent with PD specific HEP. Baseline: Goal status: IN PROGRESS  2.  Pt will demonstrate improved  shoulder flexion bilaterally to retreive a lightweight object from overhead shelf with BUE at >/= 130 shoulder flexion.  Baseline:  Goal status: IN PROGRESS  3.  Pt will verbalize understanding of adapted strategies and DME/AE PRN (reacher, BSC, shower chair, rocker knife, scoop plate/plate guard, etc) to maximize safety and independence with ADLs/IADLs.  Baseline:  Goal status: IN PROGRESS  4.  Pt will report understanding of modifications and adaptive strategies to improve handwriting legibility and ease. Baseline:  Goal status: IN PROGRESS   LONG TERM GOALS: Target date: 05/16/23  Pt will demonstrate increased functional ROM and use of BUE to aid in clothing management and hygiene with toileting  tasks.  Baseline:  Goal status: IN PROGRESS  2.  Pt will demonstrate improved ability to get up from lower seat/toilet without assistance with DME PRN. Baseline:  Goal status: IN PROGRESS  3.  Pt will demonstrate improved fine motor coordination for ADLs as evidenced by decreasing 9 hole peg test score for RUE by 10 secs on R and 1 or fewer drops. Baseline: R: 1:36 and L: 58 sec.  Pt reports dropping small items frequently Goal status: IN PROGRESS  4.  Pt will demonstrate improved ease with feeding as evidenced by decreasing PPT#2 (self feeding) by 5 secs  Baseline: 25.91 Goal status: IN PROGRESS  5.  Pt will demonstrate and/or report improved independence with LB dressing with use of AE/DME as needed.  Baseline:  Goal status: IN PROGRESS  ASSESSMENT:  CLINICAL IMPRESSION: Pt continues to benefit from cues for large amplitude during functional reach and coordination tasks. Pt tolerating increased upright standing this session, requiring intermittent cues for upright posture as pt frequently returning to more stooped posture.  Pt demonstrating improved manipulation and grasp of clothespins when in sitting.  Pt continues to demonstrate decreased shoulder ROM with functional  reach.  PERFORMANCE DEFICITS: in functional skills including ADLs, IADLs, coordination, ROM, strength, flexibility, Fine motor control, Gross motor control, balance, body mechanics, endurance, decreased knowledge of precautions, decreased knowledge of use of DME, and UE functional use and psychosocial skills including coping strategies, environmental adaptation, and routines and behaviors.   IMPAIRMENTS: are limiting patient from ADLs, IADLs, and rest and sleep.     PLAN:  OT FREQUENCY: 2x/week  OT DURATION: 8 weeks  PLANNED INTERVENTIONS: 97168 OT Re-evaluation, 97535 self care/ADL training, 32440 therapeutic exercise, 97530 therapeutic activity, 97112 neuromuscular re-education, 97035 ultrasound, 97010 moist heat, 97010 cryotherapy, functional mobility training, psychosocial skills training, energy conservation, coping strategies training, patient/family education, and DME and/or AE instructions  RECOMMENDED OTHER SERVICES: NA  CONSULTED AND AGREED WITH PLAN OF CARE: Patient  PLAN FOR NEXT SESSION: Review and add to large amplitude exercises, initiate bag exercises with focus on improved shoulder ROM, FMC tasks/HEP, Focus on sit > stand and standing balance   Chany Woolworth, OTR/L 05/03/2023, 2:14 PM   [facility] at [facility] [address] [place], [place] [address] Phone # [phone] Fax # [phone]

## 2023-05-08 ENCOUNTER — Ambulatory Visit: Payer: PPO

## 2023-05-08 ENCOUNTER — Ambulatory Visit: Payer: PPO | Admitting: Occupational Therapy

## 2023-05-08 DIAGNOSIS — R278 Other lack of coordination: Secondary | ICD-10-CM

## 2023-05-08 DIAGNOSIS — M6281 Muscle weakness (generalized): Secondary | ICD-10-CM

## 2023-05-08 DIAGNOSIS — R471 Dysarthria and anarthria: Secondary | ICD-10-CM | POA: Diagnosis not present

## 2023-05-08 DIAGNOSIS — R29818 Other symptoms and signs involving the nervous system: Secondary | ICD-10-CM

## 2023-05-08 DIAGNOSIS — R293 Abnormal posture: Secondary | ICD-10-CM

## 2023-05-08 DIAGNOSIS — R2689 Other abnormalities of gait and mobility: Secondary | ICD-10-CM

## 2023-05-08 DIAGNOSIS — G20A2 Parkinson's disease without dyskinesia, with fluctuations: Secondary | ICD-10-CM

## 2023-05-08 DIAGNOSIS — R2681 Unsteadiness on feet: Secondary | ICD-10-CM

## 2023-05-08 NOTE — Therapy (Signed)
OUTPATIENT PHYSICAL THERAPY NEURO TREATMENT Patient Name: Matthew [name] MRN: [id number] DOB:06-Jun-1950, 72 y.o., male Today's Date: 05/08/2023   PCP: Daisy Floro, MD REFERRING PROVIDER: Tat, Octaviano Batty, DO    END OF SESSION:  PT End of Session - 05/08/23 1415     Visit Number 11    Number of Visits 17    Date for PT Re-Evaluation 05/16/23    Authorization Type [employer]    Progress Note Due on Visit 10    PT Start Time 1400    PT Stop Time 1445    PT Time Calculation (min) 45 min    Activity Tolerance Patient tolerated treatment well    Behavior During Therapy WFL for tasks assessed/performed                  Past Medical History:  Diagnosis Date   Anxiety    Arthritis    GERD (gastroesophageal reflux disease)    occ remote history   Hypertension    Parkinson disease (HCC)    Prostate cancer (HCC)    Right knee pain    questionable meniscal tear   Seasonal allergies    Past Surgical History:  Procedure Laterality Date   COLONOSCOPY     Leg Laceration Repair  Left    Age 82   PELVIC LYMPH NODE DISSECTION Bilateral 03/22/2018   Procedure: PELVIC LYMPH NODE DISSECTION;  Surgeon: Rene Paci, MD;  Location: [facility];  Service: Urology;  Laterality: Bilateral;   PROSTATE BIOPSY     ROBOT ASSISTED LAPAROSCOPIC RADICAL PROSTATECTOMY N/A 03/22/2018   Procedure: XI ROBOTIC ASSISTED LAPAROSCOPIC PROSTATECTOMY;  Surgeon: Rene Paci, MD;  Location: [facility];  Service: Urology;  Laterality: N/A;   TOTAL KNEE ARTHROPLASTY Right 05/05/2019   Procedure: RIGHT TOTAL KNEE ARTHROPLASTY;  Surgeon: Gean Birchwood, MD;  Location: [facility];  Service: Orthopedics;  Laterality: Right;   Patient Active Problem List   Diagnosis Date Noted   S/P TKR (total knee replacement), right 05/05/2019   Osteoarthritis of right knee 05/02/2019   Prostate cancer (HCC) 03/22/2018   Malignant neoplasm of prostate (HCC) 02/27/2018    ONSET DATE: PD  2021  REFERRING DIAG: G20.A2 (ICD-10-CM) - Parkinson's disease without dyskinesia, with fluctuating manifestations (HCC)   THERAPY DIAG:  Other symptoms and signs involving the nervous system  Muscle weakness (generalized)  Unsteadiness on feet  Other abnormalities of gait and mobility  Abnormal posture  Parkinson's disease without dyskinesia, with fluctuating manifestations (HCC)  Rationale for Evaluation and Treatment: Rehabilitation  SUBJECTIVE:  SUBJECTIVE STATEMENT: Feeling ok today  Pt accompanied by: self, wife  PERTINENT HISTORY: PD, R TKR, prostate CA, HTN  PAIN:  Are you having pain? Yes: NPRS scale: 0/10 Pain location: upper back/lower back "it moves" Bilateral shoulders intermittently painful Pain description: hard to describe Aggravating factors: certain movements/times Relieving factors: rest  PRECAUTIONS: Fall  RED FLAGS: None   WEIGHT BEARING RESTRICTIONS: No  FALLS: Has patient fallen in last 6 months? Yes. Number of falls "several"  LIVING ENVIRONMENT: Lives with: lives with their family Lives in: House/apartment Stairs: Yes, 2-3 steps to enter Has following equipment at home: Dan Humphreys - 4 wheeled and hospital bed  PLOF: Needs assistance with ADLs, Needs assistance with transfers, and bed mobility  PATIENT GOALS: improve balance, mobility, strength. "Get as normal as I can". Be able to step up into son's Jeep  OBJECTIVE:   TODAY'S TREATMENT: 05/08/23 Activity Comments  NU-step speed intervals x 8 min 2 in warmup 30 sec intervals  Resting HR 55 bpm   Circuit training x 6 min, able to complete 5 rounds, rates 4/10 exertion. 65 bpm after -sit to stand 8# goblet 5 reps -bent over row 8# 5 reps -alt toe taps 5 reps, 6" box  Circuit training x 6 min -seated  PWR up w/ yellow band 5x -foot on step x 5 sec -heel raise 5 reps  coordination -underhand toss red medball left/right 10x, chest pass 10x         TODAY'S TREATMENT: 05/03/2023 Activity Comments  NuStep, Level 4, 4 extremities, x 8 minutes 30 sec 90+ SPM; 30 sec 70-80 SPM. Cues for change of pace  FTSTS:  10.97 sec   TUG  26.53 sec, 27.88 sec      Posterior push and release Posterior step and return to midline, 10 reps x 2 UE support at parallel bars  Standing feet together EO 30 sec On Airex feet together EC 30 sec On incline feet apart EC 30 sec Min guard, mild>mod sway  Seated posture ex: -yellow band resisted seated PWR! Up x 5 -seated scapular retraction yellow band, 3 x 5 reps    PATIENT EDUCATION: Education details: Discussed ongoing community fitness options-including trying to get back into [employer] Tuesday seated PWR! Moves class Person educated: Patient Education method: Explanation Education comprehension: verbalized understanding   HOME EXERCISE PROGRAM Last updated: 03/27/23 Supine PWR! Moves 10x each daily   Access Code: [email or web address] URL: [email or web address] Date: 04/03/2023 Prepared by: [facility] - Outpatient  Rehab - [facility]  Exercises - Supine Hamstring Stretch with Strap  - 1 x daily - 5 x weekly - 2 sets - 30 sec hold - Supine Piriformis Stretch with Foot on Ground  - 1 x daily - 5 x weekly - 2 sets - 30 sec hold - Supine Figure 4 Piriformis Stretch  - 1 x daily - 5 x weekly - 2 sets - 30 sec hold    Note: Objective measures were completed at Evaluation unless otherwise noted.  DIAGNOSTIC FINDINGS: n/a for this episode  COGNITION: Overall cognitive status:  notes increased issues with memory   SENSATION: Not tested, denies parasthesias  COORDINATION: Deficits with rapid alternating movements Finger to nose intact    MUSCLE TONE: BLE rigidity  MUSCLE LENGTH: Hamstrings: Right -30 deg; Left -30 deg   DTRs:   NT  POSTURE: rounded shoulders, forward head, increased thoracic kyphosis, and flexed trunk   LOWER EXTREMITY ROM:     Active  Right Eval Left Eval  Hip flexion    Hip extension    Hip abduction    Hip adduction    Hip internal rotation    Hip external rotation    Knee flexion    Knee extension -30 -30  Ankle dorsiflexion 10 10  Ankle plantarflexion    Ankle inversion    Ankle eversion     (Blank rows = not tested)  LOWER EXTREMITY MMT:    BLE 4/5 gross strength  BED MOBILITY:  Moderate assist  TRANSFERS: Assistive device utilized: Environmental consultant - 4 wheeled  Sit to stand: Complete Independence and Modified independence Stand to sit: Complete Independence Chair to chair: Complete Independence and Modified independence Floor:  NT    CURB:  Level of Assistance: CGA Assistive device utilized: Environmental consultant - 4 wheeled Curb Comments:   STAIRS: Reports stairs to 2nd floor but does not use 2-3 stairs to enter home  GAIT: Gait pattern: knee flexed in stance- Right and knee flexed in stance- Left Distance walked:  Assistive device utilized: Environmental consultant - 4 wheeled Level of assistance: Modified independence and SBA Comments:   FUNCTIONAL TESTS:  Mini-BESTest  11/28  Timed Up and Go test:26 sec with 4WW (compared to 15 sec)   10 meter walk test:15.31 sec = 2.14 ft/sec  (compared to 2.01 ft/sec)   5 time sit to stand test: 23 sec-(compared to 11 sec)    PATIENT EDUCATION: Education details: assessment details, rationale of intervention Person educated: Patient Education method: Explanation Education comprehension: verbalized understanding  HOME EXERCISE PROGRAM: TBD-reports daily performance 20 reps standing PWR! moves  GOALS: Goals reviewed with patient? Yes  SHORT TERM GOALS: Target date: 04/18/2023    Patient will be independent in HEP to improve functional outcomes Baseline: pt demo standing PWR! Moves + sit to stand Goal status: MET 04/17/2023  2.  Demo  improved BLE strength and balance per time 15 sec 5xSTS test Baseline: 23>15.41 sec 04/17/2023 Goal status: MET 04/17/2023  3.  Demo improved safety with ambulation per time 15 sec TUG test Baseline: 34.53 sec>29.53 sec Goal status: IN PROGRESS    LONG TERM GOALS: Target date: 05/16/2023    Improve hamstring flexibility to -15 degrees to reduce rigidity and enable greater knee extension ROM for mobility Baseline: -30 Goal status: IN PROGRESS  2.  Reduce risk for falls per score 20/28 Mini-BESTest Baseline: 11/20 Goal status: IN PROGRESS  3.  Demo modified independent bed mobility to reduce level of assist from caregivers  Baseline: moderate assist  Goal status: IN PROGRESS  ASSESSMENT:  CLINICAL IMPRESSION: Emphasis on HIIT training in today's session starting with speed intervals on NU-step for rapid alternating movements followed by two, 6 minute circuits with focus on strength, balance, and coordination amongst activities performed constantly during running clock period and performed in rapid fashion without rest period between movements. Tolerated very well and notable for lower HR throughout from resting 55 bpm to 71 bpm with exertion.  Difficulty with coordination/throwing tasks with one instances of LOB.  Continued sessions indicated to progress POC details and delay symptoms of PD to improve large amplitude movement, coordination, balance, and general activity tolerance  OBJECTIVE IMPAIRMENTS: Abnormal gait, decreased activity tolerance, decreased balance, decreased coordination, decreased mobility, difficulty walking, decreased ROM, decreased strength, impaired flexibility, impaired tone, improper body mechanics, postural dysfunction, and pain.   ACTIVITY LIMITATIONS: carrying, lifting, bending, standing, squatting, stairs, transfers, bed mobility, reach over head, and locomotion level  PARTICIPATION LIMITATIONS: meal prep, cleaning, laundry, interpersonal relationship,  shopping, community  activity, yard work, and hobbies of music  PERSONAL FACTORS: Age, Past/current experiences, Time since onset of injury/illness/exacerbation, and 1-2 comorbidities: PMH  are also affecting patient's functional outcome.   REHAB POTENTIAL: Good  CLINICAL DECISION MAKING: Evolving/moderate complexity  EVALUATION COMPLEXITY: Moderate  PLAN:  PT FREQUENCY: 2x/week  PT DURATION: 8 weeks  PLANNED INTERVENTIONS: 97110-Therapeutic exercises, 97530- Therapeutic activity, [id number]- Neuromuscular re-education, 97535- Self Care, 62130- Manual therapy, [phone]- Gait training, [phone]- Orthotic Fit/training, [phone]- Canalith repositioning, [phone]- Aquatic Therapy, Patient/Family education, Balance training, Stair training, Taping, Dry Needling, Joint mobilization, Spinal mobilization, Vestibular training, DME instructions, Cryotherapy, and Moist heat  PLAN FOR NEXT SESSION: Continue to incorporate postural strengthening.  Seated leg strengthening, sit to stand from varied surfaces.  Review bed mobility techniques with wife present; review standing PWR moves HEP, supine power moves and stretching.  Work towards LTGS and make sure pt has an exercise plan for when PT discharges (emphasize [employer] Tuesday pm PWR! Moves as an option)    2:46 PM, 05/08/23 M. Shary Decamp, PT, DPT Physical Therapist- [place] Office Number: [phone]

## 2023-05-08 NOTE — Therapy (Signed)
OUTPATIENT OCCUPATIONAL THERAPY PARKINSON'S Treatment Note  Patient Name: Matthew [name] MRN: [id number] DOB:[birth year], 72 y.o., male Today's Date: 05/08/2023  PCP: Daisy Floro, MD REFERRING PROVIDER: Tat, Octaviano Batty, DO  END OF SESSION:  OT End of Session - 05/08/23 1603     Visit Number 9    Number of Visits 17    Date for OT Re-Evaluation 05/16/23    Authorization Type [employer]    OT Start Time 1449    OT Stop Time 1531    OT Time Calculation (min) 42 min                     Past Medical History:  Diagnosis Date   Anxiety    Arthritis    GERD (gastroesophageal reflux disease)    occ remote history   Hypertension    Parkinson disease (HCC)    Prostate cancer (HCC)    Right knee pain    questionable meniscal tear   Seasonal allergies    Past Surgical History:  Procedure Laterality Date   COLONOSCOPY     Leg Laceration Repair  Left    Age 68   PELVIC LYMPH NODE DISSECTION Bilateral 03/22/2018   Procedure: PELVIC LYMPH NODE DISSECTION;  Surgeon: Rene Paci, MD;  Location: [facility];  Service: Urology;  Laterality: Bilateral;   PROSTATE BIOPSY     ROBOT ASSISTED LAPAROSCOPIC RADICAL PROSTATECTOMY N/A 03/22/2018   Procedure: XI ROBOTIC ASSISTED LAPAROSCOPIC PROSTATECTOMY;  Surgeon: Rene Paci, MD;  Location: [facility];  Service: Urology;  Laterality: N/A;   TOTAL KNEE ARTHROPLASTY Right 05/05/2019   Procedure: RIGHT TOTAL KNEE ARTHROPLASTY;  Surgeon: Gean Birchwood, MD;  Location: [facility];  Service: Orthopedics;  Laterality: Right;   Patient Active Problem List   Diagnosis Date Noted   S/P TKR (total knee replacement), right 05/05/2019   Osteoarthritis of right knee 05/02/2019   Prostate cancer (HCC) 03/22/2018   Malignant neoplasm of prostate (HCC) 02/27/2018    ONSET DATE: referral date 03/06/23  REFERRING DIAG: G20.A2 (ICD-10-CM) - Parkinson's disease without dyskinesia, with fluctuating  manifestations  THERAPY DIAG:  Other symptoms and signs involving the nervous system  Muscle weakness (generalized)  Other lack of coordination  Rationale for Evaluation and Treatment: Rehabilitation  SUBJECTIVE:   SUBJECTIVE STATEMENT: Pt reports some pain this morning when doing PWR up moves. Pt accompanied by: self and significant other  PERTINENT HISTORY: PD, anxiety, arthritis, GERD, HTN, R TKR   PRECAUTIONS: Fall  WEIGHT BEARING RESTRICTIONS: No  PAIN:  Are you having pain? No  FALLS: Has patient fallen in last 6 months? Yes. Number of falls Pt reports "having a time where he fell frequently" but has fallen ~3 times in the last "few months" with most recent being Saturday.  LIVING ENVIRONMENT: Lives with: lives with their spouse Lives in: House/apartment Stairs:  Yes: Internal: full flight of step, but able to stay on main floor without need to navigate steps; and External: 2 steps; on right going up Has following equipment at home: Single point cane, Quad cane small base, Walker - 2 wheeled, Environmental consultant - 4 wheeled, Wheelchair (manual), shower chair, Grab bars, and transport chair - for dr appts  PLOF: Requires assistive device for independence and Needs assistance with ADLs  PATIENT GOALS: to be as close to normal as possible  OBJECTIVE:  Note: Objective measures were completed at Evaluation unless otherwise noted.  HAND DOMINANCE: Right  ADLs: Overall ADLs: reports overall slowing, wife is "  second pair of hands" Transfers/ambulation related to ADLs: utilizing small 4 wheeled walker for all mobility  Eating: wife assisting with cutting foods, pt spilling foods off utensil, constantly spilling things off the plate, having to use finger to stabilize food when scooping with R hand Grooming: utilizing electric toothbrush and water pick for oral care, difficulty with brushing hair  UB Dressing: primarily wearing t-shirts/pull over shirts, occasional assist from wife if  in a hurry or get stuck, no longer wearing button up shirts anymore.  Wife will assist with donning shirt if completing after shower LB Dressing: difficulty with reaching forward towards feet when donning pants, socks, shoes; increased difficulty when crossing leg , no longer wearing lace up shoes Toileting: requires momentum to stand up from toilet or will reach towards door for leverage/momentum, reports "struggle" with hygiene  Bathing: occasional assist for thoroughness when drying off  Tub Shower transfers: wife provides supervision, handle is just inside door and pt able to hold on to it while stepping over small ledge Equipment: Grab bars, Walk in shower, and Long handled sponge  IADLs: Spouse completes all shopping, meal prep, and housekeeping tasks. Community mobility: Relies on family or friends for transportation Medication management: pt fills pill box and is able to take correct dosages at correct time Financial management: wife completes and did prior Handwriting:  TBA  MOBILITY STATUS: Hx of falls and difficulty carrying objections with ambulation  POSTURE COMMENTS:  forward head  ACTIVITY TOLERANCE: Activity tolerance: diminished  FUNCTIONAL OUTCOME MEASURES: Physical performance test: PPT#2 (simulated eating) 25.91 & PPT#4 (donning/doffing jacket): TBA  COORDINATION: 9 Hole Peg test: Right: 1:36.03 sec; Left: 58.28 sec  UE ROM:   B shoulder flexion ~120 *, reports mild discomfort in R upper arm with internal rotation but able to achieve full ROM  UE MMT:    grossly 4/5 bilaterally  MUSCLE TONE: RUE: Mild and LUE: Mild  COGNITION: Overall cognitive status:  slower processing and speech  OBSERVATIONS: Bradykinesia   TODAY'S TREATMENT:                                                                                 DATE:  05/08/23 AE: recommend use of stylus for phone use.  Discussed cell phone holder with stylus attachment to increase use and be able to keep  up with it.   Large amplitude: finger flicks and thumb opposition.  OT providing demonstration and cues for upright postural control and large amplitude movements during each exercise. Handwriting: to improve large amplitude writing strokes, to improve RUE FM coordination and dexterity for handwriting tasks.  Pt writing names of favorite Christmas carols requiring increased time.  Pt initially with large, legible words, however overtime pt handwriting decreasing in size to becoming more illegible.  OT encouraged pt to utilize spacing on lined paper to increase letter size. OT providing cues for "big letters."  Engaged in writing in both cursive as well as print, benefiting from cues for "big letters". Pt verbalized understanding.     05/03/23 Resistance Clothespins 1,2,4,6# with RUE and LUE for mid and high functional reaching and sustained pinch while in standing. OT providing cues for upright standing posture and large  amplitude reaching.  Pt demonstrating difficulty with grasping and manipulating clothespins R>L.  Pt with decreased reach due to rounded posture and limited shoulder ROM, however with cues pt with improved upright standing posture.  Transitioned to completing in sitting with increased focus on upright sitting posture and grasp.  Pt demonstrating improved ease and coordination of grasp and manipulation of clothespins when sitting.  Pt still demonstrating decreased shoulder flexion when placing clothespins on vertical dowel. Sit > stand: pt completing sit > stand from mat table with good amplitude and ease throughout session.   05/01/23 Cup stacking: with RUE and LUE, engaged in large amplitude reaching and stacking with picking up cups from various planes with focus on large amplitude reaching and hand opening.  Pt benefiting from cues and demonstration for hand opening, especially with R hand.  Transitioned to flipping cups and stacking to incorporate internal/external rotation with  large amplitude.  Pt with increased difficulty with hand placement on cups with LUE > RUE.  Coordination: engaged in picking up coins with RUE and LUE with focus on large amplitude picking up vs sliding off table.  Pt requiring sliding of coins intermittently, therefore encouraged to open fingers large even during sliding.  Pt placing coins into cup slot with cues for amplitude when pushing through slot. Sit > stand: pt requiring increased time and effort for sit > stand from standard chair without arm rests.  However when given cue for anterior weight shift and "power up" pt with improved ease and ability to stand without physical assistance.   PATIENT EDUCATION: Education details: condition specific education, large amplitude exercises Person educated: Patient Education method: Explanation Education comprehension: needs further education  HOME EXERCISE PROGRAM: Access Code: [email or web address] URL: [email or web address] Date: 04/10/2023 Prepared by: [facility] - Outpatient  Rehab - [facility]  Exercises - Seated Shoulder Shrugs  - 2 x daily - 5 reps - Seated Scapular Retraction  - 2 x daily - 5 reps - Seated Finger Flicks with Elbow Extension  - 1 x daily - 2 sets - 5-10 reps - Seated Reaching to Side and Across Body  - 1 x daily - 2 sets - 5-10 reps - Seated Reaching to Side  - 1 x daily - 2 sets - 5-10 reps  GOALS: Goals reviewed with patient? No  SHORT TERM GOALS: Target date: 04/18/23  Pt will be Independent with PD specific HEP. Baseline: Goal status: IN PROGRESS  2.  Pt will demonstrate improved shoulder flexion bilaterally to retreive a lightweight object from overhead shelf with BUE at >/= 130 shoulder flexion.  Baseline:  Goal status: IN PROGRESS  3.  Pt will verbalize understanding of adapted strategies and DME/AE PRN (reacher, BSC, shower chair, rocker knife, scoop plate/plate guard, etc) to maximize safety and independence with ADLs/IADLs.  Baseline:  Goal  status: IN PROGRESS  4.  Pt will report understanding of modifications and adaptive strategies to improve handwriting legibility and ease. Baseline:  Goal status: IN PROGRESS   LONG TERM GOALS: Target date: 05/16/23  Pt will demonstrate increased functional ROM and use of BUE to aid in clothing management and hygiene with toileting tasks.  Baseline:  Goal status: IN PROGRESS  2.  Pt will demonstrate improved ability to get up from lower seat/toilet without assistance with DME PRN. Baseline:  Goal status: IN PROGRESS  3.  Pt will demonstrate improved fine motor coordination for ADLs as evidenced by decreasing 9 hole peg test score for RUE by 10 secs on R  and 1 or fewer drops. Baseline: R: 1:36 and L: 58 sec.  Pt reports dropping small items frequently Goal status: IN PROGRESS  4.  Pt will demonstrate improved ease with feeding as evidenced by decreasing PPT#2 (self feeding) by 5 secs  Baseline: 25.91 Goal status: IN PROGRESS  5.  Pt will demonstrate and/or report improved independence with LB dressing with use of AE/DME as needed.  Baseline:  Goal status: IN PROGRESS  ASSESSMENT:  CLINICAL IMPRESSION: Pt continues to benefit from cues for upright sitting posture and large amplitude during functional reach and coordination tasks. Pt continues to demonstrate more stooped posture in sitting and standing, however with improvements noted with large amplitude movements and even writing when cued for more upright sitting posture.    PERFORMANCE DEFICITS: in functional skills including ADLs, IADLs, coordination, ROM, strength, flexibility, Fine motor control, Gross motor control, balance, body mechanics, endurance, decreased knowledge of precautions, decreased knowledge of use of DME, and UE functional use and psychosocial skills including coping strategies, environmental adaptation, and routines and behaviors.   IMPAIRMENTS: are limiting patient from ADLs, IADLs, and rest and sleep.      PLAN:  OT FREQUENCY: 2x/week  OT DURATION: 8 weeks  PLANNED INTERVENTIONS: 97168 OT Re-evaluation, 97535 self care/ADL training, 02542 therapeutic exercise, 97530 therapeutic activity, 97112 neuromuscular re-education, 97035 ultrasound, 97010 moist heat, 97010 cryotherapy, functional mobility training, psychosocial skills training, energy conservation, coping strategies training, patient/family education, and DME and/or AE instructions  RECOMMENDED OTHER SERVICES: NA  CONSULTED AND AGREED WITH PLAN OF CARE: Patient  PLAN FOR NEXT SESSION: Review and add to large amplitude exercises, initiate bag exercises with focus on improved shoulder ROM, FMC tasks/HEP, Focus on sit > stand and standing balance   Oley Lahaie, OTR/L 05/08/2023, 4:03 PM   [facility] at [facility] [address], [address] [place], [place] [address] Phone # [phone] Fax # [phone]

## 2023-05-10 ENCOUNTER — Ambulatory Visit: Payer: PPO | Admitting: Occupational Therapy

## 2023-05-10 ENCOUNTER — Ambulatory Visit: Payer: PPO

## 2023-05-10 ENCOUNTER — Ambulatory Visit: Payer: PPO | Admitting: Physical Therapy

## 2023-05-14 ENCOUNTER — Ambulatory Visit: Payer: PPO

## 2023-05-14 ENCOUNTER — Ambulatory Visit: Payer: PPO | Admitting: Occupational Therapy

## 2023-05-14 DIAGNOSIS — R293 Abnormal posture: Secondary | ICD-10-CM

## 2023-05-14 DIAGNOSIS — R4701 Aphasia: Secondary | ICD-10-CM

## 2023-05-14 DIAGNOSIS — M6281 Muscle weakness (generalized): Secondary | ICD-10-CM

## 2023-05-14 DIAGNOSIS — G20A2 Parkinson's disease without dyskinesia, with fluctuations: Secondary | ICD-10-CM

## 2023-05-14 DIAGNOSIS — R29818 Other symptoms and signs involving the nervous system: Secondary | ICD-10-CM

## 2023-05-14 DIAGNOSIS — R471 Dysarthria and anarthria: Secondary | ICD-10-CM

## 2023-05-14 DIAGNOSIS — R2689 Other abnormalities of gait and mobility: Secondary | ICD-10-CM

## 2023-05-14 DIAGNOSIS — R41841 Cognitive communication deficit: Secondary | ICD-10-CM

## 2023-05-14 DIAGNOSIS — R2681 Unsteadiness on feet: Secondary | ICD-10-CM

## 2023-05-14 DIAGNOSIS — R278 Other lack of coordination: Secondary | ICD-10-CM

## 2023-05-14 NOTE — Therapy (Signed)
OUTPATIENT OCCUPATIONAL THERAPY [facility] Treatment Note  Patient Name: Matthew [name] MRN: [id number] DOB:1950-08-15, 72 y.o., male Today's Date: 05/14/2023  PCP: Daisy Floro, MD REFERRING PROVIDER: Tat, Octaviano Batty, DO  Occupational Therapy Progress Note  Dates of Reporting Period: *** to ***  Objective Reports of Subjective Statement: ***  Objective Measurements: ***  Goal Update: ***  Plan: ***  Reason Skilled Services are Required: ***   END OF SESSION:  OT End of Session - 05/14/23 1630     Visit Number 10    Number of Visits 17    Date for OT Re-Evaluation 05/16/23    Authorization Type [employer]    OT Start Time 1536    OT Stop Time 1620    OT Time Calculation (min) 44 min                      Past Medical History:  Diagnosis Date   Anxiety    Arthritis    GERD (gastroesophageal reflux disease)    occ remote history   Hypertension    Parkinson disease (HCC)    Prostate cancer (HCC)    Right knee pain    questionable meniscal tear   Seasonal allergies    Past Surgical History:  Procedure Laterality Date   COLONOSCOPY     Leg Laceration Repair  Left    Age 73   PELVIC LYMPH NODE DISSECTION Bilateral 03/22/2018   Procedure: PELVIC LYMPH NODE DISSECTION;  Surgeon: Rene Paci, MD;  Location: [facility];  Service: Urology;  Laterality: Bilateral;   PROSTATE BIOPSY     ROBOT ASSISTED LAPAROSCOPIC RADICAL PROSTATECTOMY N/A 03/22/2018   Procedure: XI ROBOTIC ASSISTED LAPAROSCOPIC PROSTATECTOMY;  Surgeon: Rene Paci, MD;  Location: [facility];  Service: Urology;  Laterality: N/A;   TOTAL KNEE ARTHROPLASTY Right 05/05/2019   Procedure: RIGHT TOTAL KNEE ARTHROPLASTY;  Surgeon: Gean Birchwood, MD;  Location: [facility];  Service: Orthopedics;  Laterality: Right;   Patient Active Problem List   Diagnosis Date Noted   S/P TKR (total knee replacement), right 05/05/2019   Osteoarthritis of right knee 05/02/2019    Prostate cancer (HCC) 03/22/2018   Malignant neoplasm of prostate (HCC) 02/27/2018    ONSET DATE: referral date 03/06/23  REFERRING DIAG: G20.A2 (ICD-10-CM) - Parkinson's disease without dyskinesia, with fluctuating manifestations  THERAPY DIAG:  Other symptoms and signs involving the nervous system  Muscle weakness (generalized)  Unsteadiness on feet  Other lack of coordination  Rationale for Evaluation and Treatment: Rehabilitation  SUBJECTIVE:   SUBJECTIVE STATEMENT: Pt reports "that illness I had last week really took it out of me."    Pt also states he would like to keep working with therapy, however will need to consult with spouse due to copay.  Pt accompanied by: self and significant other  PERTINENT HISTORY: PD, anxiety, arthritis, GERD, HTN, R TKR   PRECAUTIONS: Fall  WEIGHT BEARING RESTRICTIONS: No  PAIN:  Are you having pain? No  FALLS: Has patient fallen in last 6 months? Yes. Number of falls Pt reports "having a time where he fell frequently" but has fallen ~3 times in the last "few months" with most recent being Saturday.  LIVING ENVIRONMENT: Lives with: lives with their spouse Lives in: House/apartment Stairs:  Yes: Internal: full flight of step, but able to stay on main floor without need to navigate steps; and External: 2 steps; on right going up Has following equipment at home: Single point cane, Quad cane  small base, Walker - 2 wheeled, Environmental consultant - 4 wheeled, Wheelchair (manual), shower chair, Grab bars, and transport chair - for dr appts  PLOF: Requires assistive device for independence and Needs assistance with ADLs  PATIENT GOALS: to be as close to normal as possible  OBJECTIVE:  Note: Objective measures were completed at Evaluation unless otherwise noted.  HAND DOMINANCE: Right  ADLs: Overall ADLs: reports overall slowing, wife is "second pair of hands" Transfers/ambulation related to ADLs: utilizing small 4 wheeled walker for all mobility   Eating: wife assisting with cutting foods, pt spilling foods off utensil, constantly spilling things off the plate, having to use finger to stabilize food when scooping with R hand Grooming: utilizing electric toothbrush and water pick for oral care, difficulty with brushing hair  UB Dressing: primarily wearing t-shirts/pull over shirts, occasional assist from wife if in a hurry or get stuck, no longer wearing button up shirts anymore.  Wife will assist with donning shirt if completing after shower LB Dressing: difficulty with reaching forward towards feet when donning pants, socks, shoes; increased difficulty when crossing leg , no longer wearing lace up shoes Toileting: requires momentum to stand up from toilet or will reach towards door for leverage/momentum, reports "struggle" with hygiene  Bathing: occasional assist for thoroughness when drying off  Tub Shower transfers: wife provides supervision, handle is just inside door and pt able to hold on to it while stepping over small ledge Equipment: Grab bars, Walk in shower, and Long handled sponge  IADLs: Spouse completes all shopping, meal prep, and housekeeping tasks. Community mobility: Relies on family or friends for transportation Medication management: pt fills pill box and is able to take correct dosages at correct time Financial management: wife completes and did prior Handwriting:  TBA  MOBILITY STATUS: Hx of falls and difficulty carrying objections with ambulation  POSTURE COMMENTS:  forward head  ACTIVITY TOLERANCE: Activity tolerance: diminished  FUNCTIONAL OUTCOME MEASURES: Physical performance test: PPT#2 (simulated eating) 25.91 & PPT#4 (donning/doffing jacket): TBA  COORDINATION: 9 Hole Peg test: Right: 1:36.03 sec; Left: 58.28 sec  UE ROM:   B shoulder flexion ~120 *, reports mild discomfort in R upper arm with internal rotation but able to achieve full ROM  UE MMT:    grossly 4/5 bilaterally  MUSCLE TONE: RUE:  Mild and LUE: Mild  COGNITION: Overall cognitive status:  slower processing and speech  OBSERVATIONS: Bradykinesia   TODAY'S TREATMENT:                                                                                 DATE:  05/14/23 Large amplitude hands: engaged in full arm extension,  Self-feeding: 28.06 sec 9 hole peg test: R: 1:39.5 and L: 1:16.31 sec Equipment in bathroom (use of shower chair for dressing/drying after shower - may benefit from grab bars in toilet room/water closet Assess goals (self-feeding, 9 hole peg test, LB dressing, functional reach for hygiene, sit > stand) Re-cert?     40/10/27 AE: recommend use of stylus for phone use.  Discussed cell phone holder with stylus attachment to increase use and be able to keep up with it.   Large amplitude: finger flicks and thumb  opposition.  OT providing demonstration and cues for upright postural control and large amplitude movements during each exercise. Handwriting: to improve large amplitude writing strokes, to improve RUE FM coordination and dexterity for handwriting tasks.  Pt writing names of favorite Christmas carols requiring increased time.  Pt initially with large, legible words, however overtime pt handwriting decreasing in size to becoming more illegible.  OT encouraged pt to utilize spacing on lined paper to increase letter size. OT providing cues for "big letters."  Engaged in writing in both cursive as well as print, benefiting from cues for "big letters". Pt verbalized understanding.     05/03/23 Resistance Clothespins 1,2,4,6# with RUE and LUE for mid and high functional reaching and sustained pinch while in standing. OT providing cues for upright standing posture and large amplitude reaching.  Pt demonstrating difficulty with grasping and manipulating clothespins R>L.  Pt with decreased reach due to rounded posture and limited shoulder ROM, however with cues pt with improved upright standing posture.   Transitioned to completing in sitting with increased focus on upright sitting posture and grasp.  Pt demonstrating improved ease and coordination of grasp and manipulation of clothespins when sitting.  Pt still demonstrating decreased shoulder flexion when placing clothespins on vertical dowel. Sit > stand: pt completing sit > stand from mat table with good amplitude and ease throughout session.   05/01/23 Cup stacking: with RUE and LUE, engaged in large amplitude reaching and stacking with picking up cups from various planes with focus on large amplitude reaching and hand opening.  Pt benefiting from cues and demonstration for hand opening, especially with R hand.  Transitioned to flipping cups and stacking to incorporate internal/external rotation with large amplitude.  Pt with increased difficulty with hand placement on cups with LUE > RUE.  Coordination: engaged in picking up coins with RUE and LUE with focus on large amplitude picking up vs sliding off table.  Pt requiring sliding of coins intermittently, therefore encouraged to open fingers large even during sliding.  Pt placing coins into cup slot with cues for amplitude when pushing through slot. Sit > stand: pt requiring increased time and effort for sit > stand from standard chair without arm rests.  However when given cue for anterior weight shift and "power up" pt with improved ease and ability to stand without physical assistance.   PATIENT EDUCATION: Education details: condition specific education, large amplitude exercises Person educated: Patient Education method: Explanation Education comprehension: needs further education  HOME EXERCISE PROGRAM: Access Code: [email or web address] URL: [email or web address] Date: 04/10/2023 Prepared by: [facility] - Outpatient  Rehab - [facility]  Exercises - Seated Shoulder Shrugs  - 2 x daily - 5 reps - Seated Scapular Retraction  - 2 x daily - 5 reps - Seated Finger Flicks with  Elbow Extension  - 1 x daily - 2 sets - 5-10 reps - Seated Reaching to Side and Across Body  - 1 x daily - 2 sets - 5-10 reps - Seated Reaching to Side  - 1 x daily - 2 sets - 5-10 reps  GOALS: Goals reviewed with patient? No  SHORT TERM GOALS: Target date: 04/18/23  Pt will be Independent with PD specific HEP. Baseline: Goal status: IN PROGRESS  2.  Pt will demonstrate improved shoulder flexion bilaterally to retreive a lightweight object from overhead shelf with BUE at >/= 130 shoulder flexion.  Baseline:  Goal status: IN PROGRESS  3.  Pt will verbalize understanding of adapted strategies and DME/AE PRN (  reacher, BSC, shower chair, rocker knife, scoop plate/plate guard, etc) to maximize safety and independence with ADLs/IADLs.  Baseline:  Goal status: IN PROGRESS  4.  Pt will report understanding of modifications and adaptive strategies to improve handwriting legibility and ease. Baseline:  Goal status: IN PROGRESS   LONG TERM GOALS: Target date: 05/16/23  Pt will demonstrate increased functional ROM and use of BUE to aid in clothing management and hygiene with toileting tasks.  Baseline:  Goal status: IN PROGRESS  2.  Pt will demonstrate improved ability to get up from lower seat/toilet without assistance with DME PRN. Baseline:  Goal status: IN PROGRESS  3.  Pt will demonstrate improved fine motor coordination for ADLs as evidenced by decreasing 9 hole peg test score for RUE by 10 secs on R and 1 or fewer drops. Baseline: R: 1:36 and L: 58 sec.  Pt reports dropping small items frequently Goal status: IN PROGRESS   4.  Pt will demonstrate improved ease with feeding as evidenced by decreasing PPT#2 (self feeding) by 5 secs  Baseline: 25.91 Goal status: IN PROGRESS  5.  Pt will demonstrate and/or report improved independence with LB dressing with use of AE/DME as needed.  Baseline:  Goal status: IN PROGRESS  ASSESSMENT:  CLINICAL IMPRESSION: Pt continues to benefit  from cues for upright sitting posture and large amplitude during functional reach and coordination tasks. Pt continues to demonstrate more stooped posture in sitting and standing, however with improvements noted with large amplitude movements and even writing when cued for more upright sitting posture.    PERFORMANCE DEFICITS: in functional skills including ADLs, IADLs, coordination, ROM, strength, flexibility, Fine motor control, Gross motor control, balance, body mechanics, endurance, decreased knowledge of precautions, decreased knowledge of use of DME, and UE functional use and psychosocial skills including coping strategies, environmental adaptation, and routines and behaviors.   IMPAIRMENTS: are limiting patient from ADLs, IADLs, and rest and sleep.     PLAN:  OT FREQUENCY: 2x/week  OT DURATION: 8 weeks  PLANNED INTERVENTIONS: 97168 OT Re-evaluation, 97535 self care/ADL training, 16109 therapeutic exercise, 97530 therapeutic activity, 97112 neuromuscular re-education, 97035 ultrasound, 97010 moist heat, 97010 cryotherapy, functional mobility training, psychosocial skills training, energy conservation, coping strategies training, patient/family education, and DME and/or AE instructions  RECOMMENDED OTHER SERVICES: NA  CONSULTED AND AGREED WITH PLAN OF CARE: Patient  PLAN FOR NEXT SESSION: Review and add to large amplitude exercises, initiate bag exercises with focus on improved shoulder ROM, FMC tasks/HEP, Focus on sit > stand and standing balance   Daquon Greenleaf, OTR/L 05/14/2023, 4:30 PM   [facility] at [facility] [address], [address] [place], [place] [address] Phone # [phone] Fax # [phone]

## 2023-05-14 NOTE — Therapy (Signed)
OUTPATIENT PHYSICAL THERAPY NEURO TREATMENT Patient Name: Matthew [name] MRN: [id number] DOB:Aug 26, 1950, 72 y.o., male Today's Date: 05/17/2023   PCP: Daisy Floro, MD REFERRING PROVIDER: Tat, Octaviano Batty, DO    END OF SESSION:  PT End of Session - 05/17/23 1359     Visit Number 13    Number of Visits 18    Date for PT Re-Evaluation 06/25/23    Authorization Type [employer]    Progress Note Due on Visit 10    PT Start Time 1317    PT Stop Time 1359    PT Time Calculation (min) 42 min    Activity Tolerance Patient tolerated treatment well    Behavior During Therapy WFL for tasks assessed/performed                   Past Medical History:  Diagnosis Date   Anxiety    Arthritis    GERD (gastroesophageal reflux disease)    occ remote history   Hypertension    Parkinson disease (HCC)    Prostate cancer (HCC)    Right knee pain    questionable meniscal tear   Seasonal allergies    Past Surgical History:  Procedure Laterality Date   COLONOSCOPY     Leg Laceration Repair  Left    Age 67   PELVIC LYMPH NODE DISSECTION Bilateral 03/22/2018   Procedure: PELVIC LYMPH NODE DISSECTION;  Surgeon: Rene Paci, MD;  Location: [facility];  Service: Urology;  Laterality: Bilateral;   PROSTATE BIOPSY     ROBOT ASSISTED LAPAROSCOPIC RADICAL PROSTATECTOMY N/A 03/22/2018   Procedure: XI ROBOTIC ASSISTED LAPAROSCOPIC PROSTATECTOMY;  Surgeon: Rene Paci, MD;  Location: [facility];  Service: Urology;  Laterality: N/A;   TOTAL KNEE ARTHROPLASTY Right 05/05/2019   Procedure: RIGHT TOTAL KNEE ARTHROPLASTY;  Surgeon: Gean Birchwood, MD;  Location: [facility];  Service: Orthopedics;  Laterality: Right;   Patient Active Problem List   Diagnosis Date Noted   S/P TKR (total knee replacement), right 05/05/2019   Osteoarthritis of right knee 05/02/2019   Prostate cancer (HCC) 03/22/2018   Malignant neoplasm of prostate (HCC) 02/27/2018    ONSET DATE: PD  2021  REFERRING DIAG: G20.A2 (ICD-10-CM) - Parkinson's disease without dyskinesia, with fluctuating manifestations (HCC)   THERAPY DIAG:  Other symptoms and signs involving the nervous system  Muscle weakness (generalized)  Unsteadiness on feet  Other lack of coordination  Other abnormalities of gait and mobility  Abnormal posture  Rationale for Evaluation and Treatment: Rehabilitation  SUBJECTIVE:  SUBJECTIVE STATEMENT: Had a good holiday.   Pt accompanied by: self  PERTINENT HISTORY: PD, R TKR, prostate CA, HTN  PAIN:  Are you having pain? Yes: NPRS scale: 0/10 Pain location: upper back/lower back "it moves" Bilateral shoulders intermittently painful Pain description: hard to describe Aggravating factors: certain movements/times Relieving factors: rest  PRECAUTIONS: Fall  RED FLAGS: None   WEIGHT BEARING RESTRICTIONS: No  FALLS: Has patient fallen in last 6 months? Yes. Number of falls "several"  LIVING ENVIRONMENT: Lives with: lives with their family Lives in: House/apartment Stairs: Yes, 2-3 steps to enter Has following equipment at home: Dan Humphreys - 4 wheeled and hospital bed  PLOF: Needs assistance with ADLs, Needs assistance with transfers, and bed mobility  PATIENT GOALS: improve balance, mobility, strength. "Get as normal as I can". Be able to step up into son's Jeep  OBJECTIVE:     TODAY'S TREATMENT: 05/17/23 Activity Comments  Nustep L4 x 6 min UEs/LEs Cues to maintain 80s SPM  open book stretch against wall Increased ROM with each rep; positioning of feet required walker assist d/t instability   Crawling into quadruped on mat to get into prone, then sidelying>sit on mat Min A and verbal cues for positioning   Prone PWR moves: Up 2x5 [name] 10x  Twist 10x each   Step 10x Placed 2 pillows under belly for comfort. Report of UEs fatiguing, requiring to get into position of relaxation. Pt reported good relief of back stiffness. UE weakness limited PWR step, requiring AAROM  Gait training with 4WW 159ft Cues for heel toe pattern, trying to maintain tall posture              HOME EXERCISE PROGRAM Last updated: 03/27/23 Supine PWR! Moves 10x each daily   Access Code: [email or web address] URL: [email or web address] Date: 04/03/2023 Prepared by: [facility] - Outpatient  Rehab - [facility]  Exercises - Supine Hamstring Stretch with Strap  - 1 x daily - 5 x weekly - 2 sets - 30 sec hold - Supine Piriformis Stretch with Foot on Ground  - 1 x daily - 5 x weekly - 2 sets - 30 sec hold - Supine Figure 4 Piriformis Stretch  - 1 x daily - 5 x weekly - 2 sets - 30 sec hold    Note: Objective measures were completed at Evaluation unless otherwise noted.  DIAGNOSTIC FINDINGS: n/a for this episode  COGNITION: Overall cognitive status:  notes increased issues with memory   SENSATION: Not tested, denies parasthesias  COORDINATION: Deficits with rapid alternating movements Finger to nose intact    MUSCLE TONE: BLE rigidity  MUSCLE LENGTH: Hamstrings: Right -30 deg; Left -30 deg   DTRs:  NT  POSTURE: rounded shoulders, forward head, increased thoracic kyphosis, and flexed trunk   LOWER EXTREMITY ROM:     Active  Right Eval Left Eval  Hip flexion    Hip extension    Hip abduction    Hip adduction    Hip internal rotation    Hip external rotation    Knee flexion    Knee extension -30 -30  Ankle dorsiflexion 10 10  Ankle plantarflexion    Ankle inversion    Ankle eversion     (Blank rows = not tested)  LOWER EXTREMITY MMT:    BLE 4/5 gross strength  BED MOBILITY:  Moderate assist  TRANSFERS: Assistive device utilized: Walker - 4 wheeled  Sit to stand: Complete Independence and Modified independence Stand to sit:  Complete Independence Chair to  chair: Complete Independence and Modified independence Floor:  NT    CURB:  Level of Assistance: CGA Assistive device utilized: Environmental consultant - 4 wheeled Curb Comments:   STAIRS: Reports stairs to 2nd floor but does not use 2-3 stairs to enter home  GAIT: Gait pattern: knee flexed in stance- Right and knee flexed in stance- Left Distance walked:  Assistive device utilized: Environmental consultant - 4 wheeled Level of assistance: Modified independence and SBA Comments:   FUNCTIONAL TESTS:  Mini-BESTest  11/28  Timed Up and Go test:26 sec with 4WW (compared to 15 sec)   10 meter walk test:15.31 sec = 2.14 ft/sec  (compared to 2.01 ft/sec)   5 time sit to stand test: 23 sec-(compared to 11 sec)    PATIENT EDUCATION: Education details: assessment details, rationale of intervention Person educated: Patient Education method: Explanation Education comprehension: verbalized understanding  HOME EXERCISE PROGRAM: TBD-reports daily performance 20 reps standing PWR! moves  GOALS: Goals reviewed with patient? Yes  SHORT TERM GOALS: Target date: 04/18/2023    Patient will be independent in HEP to improve functional outcomes Baseline: pt demo standing PWR! Moves + sit to stand Goal status: MET 04/17/2023  2.  Demo improved BLE strength and balance per time 15 sec 5xSTS test Baseline: 23>15.41 sec 04/17/2023 Goal status: MET 04/17/2023  3.  Demo improved safety with ambulation per time 15 sec TUG test Baseline: 34.53 sec>29.53 sec Goal status: IN PROGRESS    LONG TERM GOALS: Target date: 05/16/2023    Improve hamstring flexibility to -15 degrees to reduce rigidity and enable greater knee extension ROM for mobility Baseline: -30 Goal status: IN PROGRESS  2.  Reduce risk for falls per score 20/28 Mini-BESTest Baseline: 11/20 Goal status: IN PROGRESS  3.  Demo modified independent bed mobility to reduce level of assist from caregivers  Baseline: moderate  assist  Goal status: IN PROGRESS  ASSESSMENT:  CLINICAL IMPRESSION: Patient arrived to session without complaints. Worked on mobility and postural exercises today. Patient tolerated standing thoracolumbar rotation stretch well. Limited stability/balance evident in standing. Proceeded with PWR moves in prone for maximal stretch. Patient demonstrate difficulty getting into position of significant thoracic and lumbar extension but reported relief of back stiffness in this position. Also reported improved posture upon standing from mat. Worked on trying to maintain tall posture with gait at end of session.   OBJECTIVE IMPAIRMENTS: Abnormal gait, decreased activity tolerance, decreased balance, decreased coordination, decreased mobility, difficulty walking, decreased ROM, decreased strength, impaired flexibility, impaired tone, improper body mechanics, postural dysfunction, and pain.   ACTIVITY LIMITATIONS: carrying, lifting, bending, standing, squatting, stairs, transfers, bed mobility, reach over head, and locomotion level  PARTICIPATION LIMITATIONS: meal prep, cleaning, laundry, interpersonal relationship, shopping, community activity, yard work, and hobbies of music  PERSONAL FACTORS: Age, Past/current experiences, Time since onset of injury/illness/exacerbation, and 1-2 comorbidities: PMH  are also affecting patient's functional outcome.   REHAB POTENTIAL: Good  CLINICAL DECISION MAKING: Evolving/moderate complexity  EVALUATION COMPLEXITY: Moderate  PLAN:  PT FREQUENCY: 2x/week  PT DURATION: 8 weeks  PLANNED INTERVENTIONS: 97110-Therapeutic exercises, 97530- Therapeutic activity, [id number]- Neuromuscular re-education, 97535- Self Care, 16109- Manual therapy, [phone]- Gait training, [phone]- Orthotic Fit/training, [phone]- Canalith repositioning, [phone]- Aquatic Therapy, Patient/Family education, Balance training, Stair training, Taping, Dry Needling, Joint mobilization, Spinal mobilization,  Vestibular training, DME instructions, Cryotherapy, and Moist heat  PLAN FOR NEXT SESSION: Continue to incorporate postural strengthening.  Seated leg strengthening, sit to stand from varied surfaces.  Review bed mobility techniques with wife present;  review standing PWR moves HEP, supine power moves and stretching.  Work towards LTGS and make sure pt has an exercise plan for when PT discharges (emphasize [facility] Tuesday pm PWR! Moves as an option)    Baldemar Friday, PT, DPT 05/17/23 2:02 PM  [facility] Outpatient Rehab at [facility] [address] [place], [address] [place], [place] [address] Phone # [phone] Fax # [phone]

## 2023-05-14 NOTE — Therapy (Signed)
OUTPATIENT PHYSICAL THERAPY NEURO TREATMENT and Recertification Patient Name: Matthew [name] MRN: [id number] DOB:1950-11-18, 72 y.o., male Today's Date: 05/14/2023   PCP: Daisy Floro, MD REFERRING PROVIDER: Tat, Octaviano Batty, DO    END OF SESSION:  PT End of Session - 05/14/23 1457     Visit Number 12    Number of Visits 18    Date for PT Re-Evaluation 06/25/23    Authorization Type [employer]    Progress Note Due on Visit 10    PT Start Time 1445    PT Stop Time 1530    PT Time Calculation (min) 45 min    Activity Tolerance Patient tolerated treatment well    Behavior During Therapy WFL for tasks assessed/performed                  Past Medical History:  Diagnosis Date   Anxiety    Arthritis    GERD (gastroesophageal reflux disease)    occ remote history   Hypertension    Parkinson disease (HCC)    Prostate cancer (HCC)    Right knee pain    questionable meniscal tear   Seasonal allergies    Past Surgical History:  Procedure Laterality Date   COLONOSCOPY     Leg Laceration Repair  Left    Age 5   PELVIC LYMPH NODE DISSECTION Bilateral 03/22/2018   Procedure: PELVIC LYMPH NODE DISSECTION;  Surgeon: Rene Paci, MD;  Location: [facility];  Service: Urology;  Laterality: Bilateral;   PROSTATE BIOPSY     ROBOT ASSISTED LAPAROSCOPIC RADICAL PROSTATECTOMY N/A 03/22/2018   Procedure: XI ROBOTIC ASSISTED LAPAROSCOPIC PROSTATECTOMY;  Surgeon: Rene Paci, MD;  Location: [facility];  Service: Urology;  Laterality: N/A;   TOTAL KNEE ARTHROPLASTY Right 05/05/2019   Procedure: RIGHT TOTAL KNEE ARTHROPLASTY;  Surgeon: Gean Birchwood, MD;  Location: [facility];  Service: Orthopedics;  Laterality: Right;   Patient Active Problem List   Diagnosis Date Noted   S/P TKR (total knee replacement), right 05/05/2019   Osteoarthritis of right knee 05/02/2019   Prostate cancer (HCC) 03/22/2018   Malignant neoplasm of prostate (HCC) 02/27/2018     ONSET DATE: PD 2021  REFERRING DIAG: G20.A2 (ICD-10-CM) - Parkinson's disease without dyskinesia, with fluctuating manifestations (HCC)   THERAPY DIAG:  Other symptoms and signs involving the nervous system  Muscle weakness (generalized)  Unsteadiness on feet  Other abnormalities of gait and mobility  Abnormal posture  Parkinson's disease without dyskinesia, with fluctuating manifestations (HCC)  Rationale for Evaluation and Treatment: Rehabilitation  SUBJECTIVE:  SUBJECTIVE STATEMENT: Feeling a little fatigued today.  Pt accompanied by: self, wife  PERTINENT HISTORY: PD, R TKR, prostate CA, HTN  PAIN:  Are you having pain? Yes: NPRS scale: 0/10 Pain location: upper back/lower back "it moves" Bilateral shoulders intermittently painful Pain description: hard to describe Aggravating factors: certain movements/times Relieving factors: rest  PRECAUTIONS: Fall  RED FLAGS: None   WEIGHT BEARING RESTRICTIONS: No  FALLS: Has patient fallen in last 6 months? Yes. Number of falls "several"  LIVING ENVIRONMENT: Lives with: lives with their family Lives in: House/apartment Stairs: Yes, 2-3 steps to enter Has following equipment at home: Dan Humphreys - 4 wheeled and hospital bed  PLOF: Needs assistance with ADLs, Needs assistance with transfers, and bed mobility  PATIENT GOALS: improve balance, mobility, strength. "Get as normal as I can". Be able to step up into son's Jeep  OBJECTIVE:   TODAY'S TREATMENT: 05/14/23 Activity Comments  NU-step speed intervals x 9 min   Standing PWR moves 1x10 In parallel bars for UE support  Mini-BESTest 15/28  Bed mobility  Reports requiring LE assist from caregiver             PATIENT EDUCATION: Education details: Discussed ongoing community  fitness options-including trying to get back into [employer] Tuesday seated PWR! Moves class Person educated: Patient Education method: Explanation Education comprehension: verbalized understanding   HOME EXERCISE PROGRAM Last updated: 03/27/23 Supine PWR! Moves 10x each daily   Access Code: [email or web address] URL: [email or web address] Date: 04/03/2023 Prepared by: [facility] - Outpatient  Rehab - [facility]  Exercises - Supine Hamstring Stretch with Strap  - 1 x daily - 5 x weekly - 2 sets - 30 sec hold - Supine Piriformis Stretch with Foot on Ground  - 1 x daily - 5 x weekly - 2 sets - 30 sec hold - Supine Figure 4 Piriformis Stretch  - 1 x daily - 5 x weekly - 2 sets - 30 sec hold    Note: Objective measures were completed at Evaluation unless otherwise noted.  DIAGNOSTIC FINDINGS: n/a for this episode  COGNITION: Overall cognitive status:  notes increased issues with memory   SENSATION: Not tested, denies parasthesias  COORDINATION: Deficits with rapid alternating movements Finger to nose intact    MUSCLE TONE: BLE rigidity  MUSCLE LENGTH: Hamstrings: Right -30 deg; Left -30 deg   DTRs:  NT  POSTURE: rounded shoulders, forward head, increased thoracic kyphosis, and flexed trunk   LOWER EXTREMITY ROM:     Active  Right Eval Left Eval  Hip flexion    Hip extension    Hip abduction    Hip adduction    Hip internal rotation    Hip external rotation    Knee flexion    Knee extension -30 -30  Ankle dorsiflexion 10 10  Ankle plantarflexion    Ankle inversion    Ankle eversion     (Blank rows = not tested)  LOWER EXTREMITY MMT:    BLE 4/5 gross strength  BED MOBILITY:  Moderate assist  TRANSFERS: Assistive device utilized: Environmental consultant - 4 wheeled  Sit to stand: Complete Independence and Modified independence Stand to sit: Complete Independence Chair to chair: Complete Independence and Modified independence Floor:  NT    CURB:  Level  of Assistance: CGA Assistive device utilized: Environmental consultant - 4 wheeled Curb Comments:   STAIRS: Reports stairs to 2nd floor but does not use 2-3 stairs to enter home  GAIT: Gait pattern: knee flexed in stance- Right and knee  flexed in stance- Left Distance walked:  Assistive device utilized: Environmental consultant - 4 wheeled Level of assistance: Modified independence and SBA Comments:   FUNCTIONAL TESTS:  Mini-BESTest  11/28  Timed Up and Go test:26 sec with 4WW (compared to 15 sec)   10 meter walk test:15.31 sec = 2.14 ft/sec  (compared to 2.01 ft/sec)   5 time sit to stand test: 23 sec-(compared to 11 sec)    PATIENT EDUCATION: Education details: assessment details, rationale of intervention Person educated: Patient Education method: Explanation Education comprehension: verbalized understanding  HOME EXERCISE PROGRAM: TBD-reports daily performance 20 reps standing PWR! moves  GOALS: Goals reviewed with patient? Yes  SHORT TERM GOALS: Target date: 04/18/2023    Patient will be independent in HEP to improve functional outcomes Baseline: pt demo standing PWR! Moves + sit to stand Goal status: MET 04/17/2023  2.  Demo improved BLE strength and balance per time 15 sec 5xSTS test Baseline: 23>15.41 sec 04/17/2023 Goal status: MET 04/17/2023  3.  Demo improved safety with ambulation per time 15 sec TUG test Baseline: 34.53 sec>29.53 sec; (05/14/23) 21 sec w/ 5WU Goal status: IN PROGRESS    LONG TERM GOALS: Target date: 05/16/2023    Improve hamstring flexibility to -15 degrees to reduce rigidity and enable greater knee extension ROM for mobility Baseline: -30; (05/14/23) -20 degrees Goal status: IN PROGRESS  2.  Reduce risk for falls per score 20/28 Mini-BESTest Baseline: 11/20; (05/14/23) 15/28 Goal status: IN PROGRESS  3.  Demo modified independent bed mobility to reduce level of assist from caregivers  Baseline: moderate assist; reports assist for BLE  Goal status: IN  PROGRESS  ASSESSMENT:  CLINICAL IMPRESSION: Initiated with NU-step for rapid alternating movement followed by large amplitude movements to improve coordination, fluidity, and flexibility.  Performance of Mini-BESTest with score of 15/28 improved from initial 11/28.  Notes ongoing balance defiicits being main concern. Continues to exhibit rigidity and stopped posture with limited knee extension ROM from lack of flexibility/tone.  Pt notes continued difficulty with bed mobility as well still reuqiring caregiver assist.  Discussed continued plan of care and pt notes financial burden of multiple co-pays due to seeing multidisciplinary team. Discussed with tapering visits to 1x/wk for continued program details to improve mobility and reduce onset of symptoms and mobility deifcits.   OBJECTIVE IMPAIRMENTS: Abnormal gait, decreased activity tolerance, decreased balance, decreased coordination, decreased mobility, difficulty walking, decreased ROM, decreased strength, impaired flexibility, impaired tone, improper body mechanics, postural dysfunction, and pain.   ACTIVITY LIMITATIONS: carrying, lifting, bending, standing, squatting, stairs, transfers, bed mobility, reach over head, and locomotion level  PARTICIPATION LIMITATIONS: meal prep, cleaning, laundry, interpersonal relationship, shopping, community activity, yard work, and hobbies of music  PERSONAL FACTORS: Age, Past/current experiences, Time since onset of injury/illness/exacerbation, and 1-2 comorbidities: PMH  are also affecting patient's functional outcome.   REHAB POTENTIAL: Good  CLINICAL DECISION MAKING: Evolving/moderate complexity  EVALUATION COMPLEXITY: Moderate  PLAN:  PT FREQUENCY: 1x/week  PT DURATION: 6 weeks  PLANNED INTERVENTIONS: 97110-Therapeutic exercises, 97530- Therapeutic activity, [id number]- Neuromuscular re-education, 97535- Self Care, 98119- Manual therapy, [phone]- Gait training, [phone]- Orthotic Fit/training, [phone]-  Canalith repositioning, [phone]- Aquatic Therapy, Patient/Family education, Balance training, Stair training, Taping, Dry Needling, Joint mobilization, Spinal mobilization, Vestibular training, DME instructions, Cryotherapy, and Moist heat  PLAN FOR NEXT SESSION: Continue to incorporate postural strengthening.  Seated leg strengthening, sit to stand from varied surfaces.  Review bed mobility techniques with wife present; review standing PWR moves HEP, supine power moves and  stretching.  Work towards LTGS and make sure pt has an exercise plan for when PT discharges (emphasize [employer] Tuesday pm PWR! Moves as an option)    3:43 PM, 05/14/23 M. Shary Decamp, PT, DPT Physical Therapist-  Office Number: [phone]

## 2023-05-14 NOTE — Therapy (Signed)
OUTPATIENT SPEECH LANGUAGE PATHOLOGY PARKINSON'S TREATMENT   Patient Name: Matthew [name] MRN: [id number] DOB:Jul 12, 1950, 71 y.o., male Today's Date: 05/14/2023  PCP: Gildardo Cranker, MD REFERRING PROVIDER: Kerin Salen, DO  END OF SESSION:  End of Session - 05/14/23 1338     Visit Number 10    Number of Visits 17    Date for SLP Re-Evaluation 05/25/23    SLP Start Time 1322   checked in 1321   SLP Stop Time  1400    SLP Time Calculation (min) 38 min    Activity Tolerance Patient tolerated treatment well              Past Medical History:  Diagnosis Date   Anxiety    Arthritis    GERD (gastroesophageal reflux disease)    occ remote history   Hypertension    Parkinson disease (HCC)    Prostate cancer (HCC)    Right knee pain    questionable meniscal tear   Seasonal allergies    Past Surgical History:  Procedure Laterality Date   COLONOSCOPY     Leg Laceration Repair  Left    Age 46   PELVIC LYMPH NODE DISSECTION Bilateral 03/22/2018   Procedure: PELVIC LYMPH NODE DISSECTION;  Surgeon: Rene Paci, MD;  Location: [facility];  Service: Urology;  Laterality: Bilateral;   PROSTATE BIOPSY     ROBOT ASSISTED LAPAROSCOPIC RADICAL PROSTATECTOMY N/A 03/22/2018   Procedure: XI ROBOTIC ASSISTED LAPAROSCOPIC PROSTATECTOMY;  Surgeon: Rene Paci, MD;  Location: [facility];  Service: Urology;  Laterality: N/A;   TOTAL KNEE ARTHROPLASTY Right 05/05/2019   Procedure: RIGHT TOTAL KNEE ARTHROPLASTY;  Surgeon: Gean Birchwood, MD;  Location: [facility];  Service: Orthopedics;  Laterality: Right;   Patient Active Problem List   Diagnosis Date Noted   S/P TKR (total knee replacement), right 05/05/2019   Osteoarthritis of right knee 05/02/2019   Prostate cancer (HCC) 03/22/2018   Malignant neoplasm of prostate (HCC) 02/27/2018   Speech Therapy Progress Note  Dates of Reporting Period: 03/21/23 to present  Subjective Statement: Pt has been seen for 10 ST sessions  focusing on speech loudness,  Objective: See below.  Goal Update: See below.  Plan: See pt for at least 6 more sessions.  Reason Skilled Services are Required: Pt has not maximized rehab potential to date.   ONSET DATE: script dated 03-06-23  REFERRING DIAG: Parkinson's Disease  THERAPY DIAG:  Dysarthria and anarthria  Cognitive communication deficit  Aphasia  Rationale for Evaluation and Treatment: Rehabilitation  SUBJECTIVE:   SUBJECTIVE STATEMENT: "I felt horrible, Matthew [name]." Pt accompanied by: self  PERTINENT HISTORY: Pt well-known to this SLP from previous courses of ST. MBS in August 2023- results below. Pt's last ST course resulted in pt partially meeting goal for producing speech in upper 60s dB in simple to mod complex with occasional min A.  PAIN:  Are you having pain? No  FALLS: Has patient fallen in last 6 months?  See PT evaluation for details  PATIENT GOALS: Improve loudness and fluency  OBJECTIVE:   DIAGNOSTIC FINDINGS:  MBS 12-22-21 Patient presents with functional oropharyngeal swallowing without aspiration of any consistency tested. He does demonstrate minimal asymptomatic laryngeal penetration of thin liquid due to decreased timing of laryngeal closure. Chin tuck posture with use of straw effective to prevent penetration. Pharyngeal swallow is strong without retention. Oral transiting discoordination noted with pt swallowing barium tablet as he required 2nd bolus of thin to transit through oropharynx. Upon esophageal sweep,  pt appeared with barium tablet retained in esophagus with pt awareness. Tablet appeared to transit distally after several boluses of liquids. After tablet had transited, pt continued with "phantom" sensation to esophagus. Recommend pt continue diet as tolerated, using chin tuck with liquids if improves comfort, decreases cough. Also encouraged pt to assure maintains strength of voice, cough and expectoration for airway protection.     PATIENT REPORTED OUTCOME MEASURES (PROM): Communication Effectiveness Survey: pt returned CES on 04/12/23 with score of 18/40, with lower scores indicating more impact of pt's deficits on daily communication situations.   TODAY'S TREATMENT:                                                                                                                                         DATE:  05/14/23: Pt conversation mid 60s in 5 minutes conversation. "I just can't get loud - this thing really did me in," pt stated. SLP educated pt on how illness can affect people re: deconditioning and this may be worse for people with PD than people who do not have PD. Pt produced loud /a/ with mid 70s dB initially and with initial min cues from SLP improved this to average 82 dB. In short (1 minute) conversational segments/monologues, pt req'd occasional min A. This is not as successful as last 1-2 sessions.SLP encouraged pt to get back to practicing /a/ ASAP and he should improve average dB and duration within a few days. Pt agreed.   05/03/23: SLP and pt engaged in conversation for ~5 minute conversational segments x5 today, pt maintained average of 68dB with occasional min A for loudness. Pt endorses his loudness is better at home in the last week.   04/26/23: Pt talked with SLP in simple conversation for 15 minutes with rare min A for loudness. Loud /a/ targeted today to make louder speech more habitual with average loudness 83dB, with rare min A for loudness. Everyday sentences read with average loudness 80dB with rare min-mod cues for loudness. Conversation afterwards (simple to mod complex) of 15 minutes averaged 67dB with occasional min A for loudness/effort.  04/24/23: Pt taking his PD meds just prior to ST. Pt states he performed loud /a/ and sentences 5/7 days. Loud /a/ targeted today to make louder speech more habitual. Average loudness 85dB, with rare incr'd to occasional min A for loudness. Everyday sentences  read with average loudness 80dB with occasional min-mod cues for loudness. Pt req'd usual min-mod cues for loudness in 3-4 minute conversational segments. SLP encouraged pt to cont to practice consistently.   04/17/23: Pt states family is asking him less frequently for repeats. Today in short conversation segments of 3 minutes, pt maintained upper 60s dB average for 4 segments and then req'd incr'ing level of cueing to occasional min-mod A by 7th segment. SLP encouraged pt to cont to practice at home.   04/12/23: Loud /a/ targeted today to  make louder speech more habitual. Average loudness 88dB, with usual min A for loudness. Everyday sentences read with average loudness 82dB with usual mod cues for loudness and tactile cues of abdominal recruitment. In sentence responses pt req'd min-mod A for loudness, occasionally. Pt appeared fatigued from previous therapies today and told SLP affirmatively he was fatigued when SLP asked him. SLP provided homework for sentence responses. CES returned today - results above.   04/10/23: Loud /a/ targeted today to make louder speech more habitual. Average loudness 85dB. Everyday sentences read with average loudness 83dB. SLP engaged in conversational topics (3-4 minutes) x5; SLP req'd to provide min-mod cues, rarely - which increased to mod cues occasionally by last topic. Pt acknowledged he was fatigued from PT where they worked on bed mobility. Pt provided 2 sentence responses with average 65dB with rare min-mod A for loudness. Other multiple sentence responses were challenging today due to decr'd divided attention negatively impacting pt performance today more than previous sessions, likely due to fatigue factor. SLP encouraged pt to cont to practice at home. "I always practice with my grandkids because I don't want them to have to ask me to repeat like [name] does."  04/03/23: SLP engaged pt with loud /a/ to encourage muscle memory to make louder speech more habitual.  Average loudness 85dB. Initial cues for loudness but pt independent after that. Everyday sentences were read with average 81dB.  Word level responses with higher cognitive load completed with average 70dB.  03/27/23: Pt arrived and greeted SLP with 70dB average, and then decr'd to a soft volume once he sat down in ST room (average 64dB). Initial cues for louder speech and tactile cue for feeling strength in his voice from abdomen, thinking about effort for /a/.  SLP engaged pt in conversation for 38 minutes, with conversational segments of 8-13 minutes, x4. SLP req'd occasional mod cues for pt to improve loudness, faded to occasional min-mod cues. Pt demonstrated decr'd stamina approx 32 minutes into conversational segments. SLP reiterated the necessity to perform loud /a/ at home as prescribed when pt went to lobby to meet wife.   03/21/23 (eval): SPEECH: SLP explained eval results to pt. SLP ensured pt knew the importance of daily loud /a/ and everyday sentences practice BID.  SWALLOWING: Additionally, he and SLP practiced chin down posture with a straw and with a cup.  PATIENT EDUCATION: Education details: see "today's treatment" Person educated: Patient Education method: Explanation and Demonstration Education comprehension: verbalized understanding  HOME EXERCISE PROGRAM: Loud /a/ and everyday sentences.    GOALS: Goals reviewed with patient? No  SHORT TERM GOALS: Target date: 04/20/23  Produce /a/ with average mid-upper 80s dB over three sessions Baseline: 04/12/23 Goal status: Partially met  2.  pt will demo abdominal breathing with sentence responses 85% accuracy x 3 sessions  Baseline:  Goal status: not addressed yet  3.  pt will generate 2 minutes simple conversation with upper 60s dB average in 2 sessions  Baseline: 04/17/23 Goal status: Partially met  4.  Pt will use strategies for dysfluency in 1-2 minute short conversational segments  Baseline:  Goal status:  deferred - dysfluency not heard in sessions thus far   LONG TERM GOALS: Target date: 05/20/23  pt will produce average upper 60s dB in 10 minutes simple to mod complex conversation in 5 sessions  Baseline:  Goal status: INITIAL  2.  pt will maintain upper 60s dB average in 10 minute simple conversation x 3 sessions  Baseline: 04/26/23  Goal status: INITIAL  3.  pt will use abdominal breathing >80% of the time during 10 minute simple conversation in 3 sessions  Baseline: 04/26/23 Goal status: INITIAL  4.  pt will report fewer requests to repeat himself than prior to ST in 3 sessions  Baseline:  Goal status: INITIAL  5.  Pt will use strategies for safer swallowing in 3 sessions with modified independence  Baseline:  Goal status: INITIAL  6.  Pt will score higher/better on PROM in the last 2 weeks of therapy Baseline:  Goal status: INITIAL  ASSESSMENT:  CLINICAL IMPRESSION: Patient is a 72 y.o. male who was seen today for treatment of dysarthria in light of Parkinsons Disease. See "today's treatment" for more details. Pt told SLP on eval date that family members ask him to repeat frequently, and he frequency of communicating in social situations has decr'd since last session of previous therapy course. Pt was able to improve loudness in simple conversation for 3 minutes and thus is an excellent candidate for ST.  Additionally when pt used chin tuck posture with liquids as told on MBS in August 2023, he felt safer.  OBJECTIVE IMPAIRMENTS: Objective impairments include executive functioning, aphasia, dysarthria, and dysphagia. These impairments are limiting patient from household responsibilities, ADLs/IADLs, effectively communicating at home and in community, and safety when swallowing.Factors affecting potential to achieve goals and functional outcome are  none .Marland Kitchen Patient will benefit from skilled SLP services to address above impairments and improve overall function.  REHAB POTENTIAL:  Good  PLAN:  SLP FREQUENCY: 2x/week  SLP DURATION: 8 weeks  PLANNED INTERVENTIONS: Aspiration precaution training, Diet toleration management , Environmental controls, Cueing hierachy, Cognitive reorganization, Internal/external aids, Oral motor exercises, Functional tasks, Multimodal communication approach, SLP instruction and feedback, Compensatory strategies, and Patient/family education    [facility], CCC-SLP 05/14/2023, 1:42 PM

## 2023-05-17 ENCOUNTER — Encounter: Payer: Self-pay | Admitting: Physical Therapy

## 2023-05-17 ENCOUNTER — Ambulatory Visit: Payer: PPO | Admitting: Physical Therapy

## 2023-05-17 DIAGNOSIS — R471 Dysarthria and anarthria: Secondary | ICD-10-CM | POA: Diagnosis not present

## 2023-05-17 DIAGNOSIS — M6281 Muscle weakness (generalized): Secondary | ICD-10-CM

## 2023-05-17 DIAGNOSIS — R2681 Unsteadiness on feet: Secondary | ICD-10-CM

## 2023-05-17 DIAGNOSIS — R278 Other lack of coordination: Secondary | ICD-10-CM

## 2023-05-17 DIAGNOSIS — R29818 Other symptoms and signs involving the nervous system: Secondary | ICD-10-CM

## 2023-05-17 DIAGNOSIS — R2689 Other abnormalities of gait and mobility: Secondary | ICD-10-CM

## 2023-05-17 DIAGNOSIS — R293 Abnormal posture: Secondary | ICD-10-CM

## 2023-05-31 ENCOUNTER — Ambulatory Visit: Payer: PPO | Attending: Neurology | Admitting: Occupational Therapy

## 2023-05-31 ENCOUNTER — Ambulatory Visit: Payer: PPO

## 2023-05-31 DIAGNOSIS — R471 Dysarthria and anarthria: Secondary | ICD-10-CM | POA: Insufficient documentation

## 2023-05-31 DIAGNOSIS — G20A2 Parkinson's disease without dyskinesia, with fluctuations: Secondary | ICD-10-CM

## 2023-05-31 DIAGNOSIS — R2681 Unsteadiness on feet: Secondary | ICD-10-CM | POA: Insufficient documentation

## 2023-05-31 DIAGNOSIS — R41841 Cognitive communication deficit: Secondary | ICD-10-CM | POA: Diagnosis present

## 2023-05-31 DIAGNOSIS — R278 Other lack of coordination: Secondary | ICD-10-CM | POA: Insufficient documentation

## 2023-05-31 DIAGNOSIS — R293 Abnormal posture: Secondary | ICD-10-CM | POA: Diagnosis present

## 2023-05-31 DIAGNOSIS — R1310 Dysphagia, unspecified: Secondary | ICD-10-CM | POA: Diagnosis present

## 2023-05-31 DIAGNOSIS — R29818 Other symptoms and signs involving the nervous system: Secondary | ICD-10-CM | POA: Diagnosis present

## 2023-05-31 DIAGNOSIS — R2689 Other abnormalities of gait and mobility: Secondary | ICD-10-CM | POA: Diagnosis present

## 2023-05-31 DIAGNOSIS — M6281 Muscle weakness (generalized): Secondary | ICD-10-CM | POA: Diagnosis present

## 2023-05-31 DIAGNOSIS — R4701 Aphasia: Secondary | ICD-10-CM | POA: Diagnosis present

## 2023-05-31 NOTE — Therapy (Signed)
 OUTPATIENT PHYSICAL THERAPY NEURO TREATMENT Patient Name: Matthew [name] MRN: [id number] DOB:1951/02/26, 73 y.o., male Today's Date: 05/31/2023   PCP: Okey Carlin Redbird, MD REFERRING PROVIDER: Tat, Asberry RAMAN, DO    END OF SESSION:  PT End of Session - 05/31/23 1451     Visit Number 14    Number of Visits 18    Date for PT Re-Evaluation 06/25/23    Authorization Type [employer]    Progress Note Due on Visit 20    PT Start Time 1445    PT Stop Time 1530    PT Time Calculation (min) 45 min    Activity Tolerance Patient tolerated treatment well    Behavior During Therapy WFL for tasks assessed/performed                   Past Medical History:  Diagnosis Date   Anxiety    Arthritis    GERD (gastroesophageal reflux disease)    occ remote history   Hypertension    Parkinson disease (HCC)    Prostate cancer (HCC)    Right knee pain    questionable meniscal tear   Seasonal allergies    Past Surgical History:  Procedure Laterality Date   COLONOSCOPY     Leg Laceration Repair  Left    Age 48   PELVIC LYMPH NODE DISSECTION Bilateral 03/22/2018   Procedure: PELVIC LYMPH NODE DISSECTION;  Surgeon: Devere Lonni Righter, MD;  Location: [facility];  Service: Urology;  Laterality: Bilateral;   PROSTATE BIOPSY     ROBOT ASSISTED LAPAROSCOPIC RADICAL PROSTATECTOMY N/A 03/22/2018   Procedure: XI ROBOTIC ASSISTED LAPAROSCOPIC PROSTATECTOMY;  Surgeon: Devere Lonni Righter, MD;  Location: [facility];  Service: Urology;  Laterality: N/A;   TOTAL KNEE ARTHROPLASTY Right 05/05/2019   Procedure: RIGHT TOTAL KNEE ARTHROPLASTY;  Surgeon: Liam Lerner, MD;  Location: [facility];  Service: Orthopedics;  Laterality: Right;   Patient Active Problem List   Diagnosis Date Noted   S/P TKR (total knee replacement), right 05/05/2019   Osteoarthritis of right knee 05/02/2019   Prostate cancer (HCC) 03/22/2018   Malignant neoplasm of prostate (HCC) 02/27/2018    ONSET DATE: PD  2021  REFERRING DIAG: G20.A2 (ICD-10-CM) - Parkinson's disease without dyskinesia, with fluctuating manifestations (HCC)   THERAPY DIAG:  Muscle weakness (generalized)  Unsteadiness on feet  Abnormal posture  Other abnormalities of gait and mobility  Parkinson's disease without dyskinesia, with fluctuating manifestations (HCC)  Rationale for Evaluation and Treatment: Rehabilitation  SUBJECTIVE:  SUBJECTIVE STATEMENT: Doing fine, no new issues, just moving slowly  Pt accompanied by: self  PERTINENT HISTORY: PD, R TKR, prostate CA, HTN  PAIN:  Are you having pain? Yes: NPRS scale: 0/10 Pain location: upper back/lower back it moves Bilateral shoulders intermittently painful Pain description: hard to describe Aggravating factors: certain movements/times Relieving factors: rest  PRECAUTIONS: Fall  RED FLAGS: None   WEIGHT BEARING RESTRICTIONS: No  FALLS: Has patient fallen in last 6 months? Yes. Number of falls several  LIVING ENVIRONMENT: Lives with: lives with their family Lives in: House/apartment Stairs: Yes, 2-3 steps to enter Has following equipment at home: Vannie - 4 wheeled and hospital bed  PLOF: Needs assistance with ADLs, Needs assistance with transfers, and bed mobility  PATIENT GOALS: improve balance, mobility, strength. Get as normal as I can. Be able to step up into son's Jeep  OBJECTIVE:   TODAY'S TREATMENT: 05/31/23 Activity Comments  NU-step speed intervals x 8 min 2 min warm-up 30 sec sprint; 30 sec slow  Circuit training x 10 min, completing 4 rounds -LAQ 10x 3# -5xSTS w/ green med ball -med ball slam 5x  Standing gastroc stretch 2x60 sec slantboard  Coordination  Various throwing forms green med ball, emphasis on weight shift/trunk rotation w/ throw   LE stretching -long sit hamstring (unilat) 2x 60 sec          TODAY'S TREATMENT: 05/17/23 Activity Comments  Nustep L4 x 6 min UEs/LEs Cues to maintain 80s SPM  open book stretch against wall Increased ROM with each rep; positioning of feet required walker assist d/t instability   Crawling into quadruped on mat to get into prone, then sidelying>sit on mat Min A and verbal cues for positioning   Prone PWR moves: Up 2x5 [name] 10x  Twist 10x each  Step 10x Placed 2 pillows under belly for comfort. Report of UEs fatiguing, requiring to get into position of relaxation. Pt reported good relief of back stiffness. UE weakness limited PWR step, requiring AAROM  Gait training with 4WW 147ft Cues for heel toe pattern, trying to maintain tall posture              HOME EXERCISE PROGRAM Last updated: 03/27/23 Supine PWR! Moves 10x each daily   Access Code: [email or web address] URL: [email or web address] Date: 04/03/2023 Prepared by: [facility] - Outpatient  Rehab - [facility]  Exercises - Supine Hamstring Stretch with Strap  - 1 x daily - 5 x weekly - 2 sets - 30 sec hold - Supine Piriformis Stretch with Foot on Ground  - 1 x daily - 5 x weekly - 2 sets - 30 sec hold - Supine Figure 4 Piriformis Stretch  - 1 x daily - 5 x weekly - 2 sets - 30 sec hold    Note: Objective measures were completed at Evaluation unless otherwise noted.  DIAGNOSTIC FINDINGS: n/a for this episode  COGNITION: Overall cognitive status:  notes increased issues with memory   SENSATION: Not tested, denies parasthesias  COORDINATION: Deficits with rapid alternating movements Finger to nose intact    MUSCLE TONE: BLE rigidity  MUSCLE LENGTH: Hamstrings: Right -30 deg; Left -30 deg   DTRs:  NT  POSTURE: rounded shoulders, forward head, increased thoracic kyphosis, and flexed trunk   LOWER EXTREMITY ROM:     Active  Right Eval Left Eval  Hip flexion    Hip extension    Hip abduction     Hip adduction    Hip internal rotation  Hip external rotation    Knee flexion    Knee extension -30 -30  Ankle dorsiflexion 10 10  Ankle plantarflexion    Ankle inversion    Ankle eversion     (Blank rows = not tested)  LOWER EXTREMITY MMT:    BLE 4/5 gross strength  BED MOBILITY:  Moderate assist  TRANSFERS: Assistive device utilized: Environmental Consultant - 4 wheeled  Sit to stand: Complete Independence and Modified independence Stand to sit: Complete Independence Chair to chair: Complete Independence and Modified independence Floor:  NT    CURB:  Level of Assistance: CGA Assistive device utilized: Environmental Consultant - 4 wheeled Curb Comments:   STAIRS: Reports stairs to 2nd floor but does not use 2-3 stairs to enter home  GAIT: Gait pattern: knee flexed in stance- Right and knee flexed in stance- Left Distance walked:  Assistive device utilized: Environmental Consultant - 4 wheeled Level of assistance: Modified independence and SBA Comments:   FUNCTIONAL TESTS:  Mini-BESTest  11/28  Timed Up and Go test:26 sec with 4WW (compared to 15 sec)   10 meter walk test:15.31 sec = 2.14 ft/sec  (compared to 2.01 ft/sec)   5 time sit to stand test: 23 sec-(compared to 11 sec)    PATIENT EDUCATION: Education details: assessment details, rationale of intervention Person educated: Patient Education method: Explanation Education comprehension: verbalized understanding  HOME EXERCISE PROGRAM: TBD-reports daily performance 20 reps standing PWR! moves  GOALS: Goals reviewed with patient? Yes  SHORT TERM GOALS: Target date: 04/18/2023    Patient will be independent in HEP to improve functional outcomes Baseline: pt demo standing PWR! Moves + sit to stand Goal status: MET 04/17/2023  2.  Demo improved BLE strength and balance per time 15 sec 5xSTS test Baseline: 23>15.41 sec 04/17/2023 Goal status: MET 04/17/2023  3.  Demo improved safety with ambulation per time 15 sec TUG test Baseline: 34.53  sec>29.53 sec Goal status: IN PROGRESS    LONG TERM GOALS: Target date: 05/16/2023    Improve hamstring flexibility to -15 degrees to reduce rigidity and enable greater knee extension ROM for mobility Baseline: -30 Goal status: IN PROGRESS  2.  Reduce risk for falls per score 20/28 Mini-BESTest Baseline: 11/20 Goal status: IN PROGRESS  3.  Demo modified independent bed mobility to reduce level of assist from caregivers  Baseline: moderate assist  Goal status: IN PROGRESS  ASSESSMENT:  CLINICAL IMPRESSION: Initiated with Nu-step bike for speed intervals for rapid alternating movement with good attention to transitions.  Circuit training for HIIT with emphasis on strength and power for movements with large amplitude and resistance for general conditioning and increased speed of movement.  Coordination through throwing activities to improve weight shift, trunk rotation, and power generation to throw heavy object and withstand postural perturbations.  Static stretching at end of session to improve flexibility and reduce rigidity/flexion posture and knee flexion contractures to improve posture and mobility. Continued sessions to progress POC details to improve mobility and reduce ill effects of chronic, progressive neurologic disease to prevent/delay further functional decline.   OBJECTIVE IMPAIRMENTS: Abnormal gait, decreased activity tolerance, decreased balance, decreased coordination, decreased mobility, difficulty walking, decreased ROM, decreased strength, impaired flexibility, impaired tone, improper body mechanics, postural dysfunction, and pain.   ACTIVITY LIMITATIONS: carrying, lifting, bending, standing, squatting, stairs, transfers, bed mobility, reach over head, and locomotion level  PARTICIPATION LIMITATIONS: meal prep, cleaning, laundry, interpersonal relationship, shopping, community activity, yard work, and hobbies of music  PERSONAL FACTORS: Age, Past/current experiences,  Time since  onset of injury/illness/exacerbation, and 1-2 comorbidities: PMH  are also affecting patient's functional outcome.   REHAB POTENTIAL: Good  CLINICAL DECISION MAKING: Evolving/moderate complexity  EVALUATION COMPLEXITY: Moderate  PLAN:  PT FREQUENCY: 2x/week  PT DURATION: 8 weeks  PLANNED INTERVENTIONS: 97110-Therapeutic exercises, 97530- Therapeutic activity, [id number]- Neuromuscular re-education, 97535- Self Care, [id number]- Manual therapy, [phone]- Gait training, [phone]- Orthotic Fit/training, [phone]- Canalith repositioning, [phone]- Aquatic Therapy, Patient/Family education, Balance training, Stair training, Taping, Dry Needling, Joint mobilization, Spinal mobilization, Vestibular training, DME instructions, Cryotherapy, and Moist heat  PLAN FOR NEXT SESSION: Continue to incorporate postural strengthening.  Seated leg strengthening, sit to stand from varied surfaces.  Review bed mobility techniques with wife present; review standing PWR moves HEP, supine power moves and stretching.  Work towards LTGS and make sure pt has an exercise plan for when PT discharges (emphasize [facility] Tuesday pm PWR! Moves as an option)    Louana Terrilyn Christians, PT, DPT 05/31/23 2:52 PM  [facility] [address] [place], [address] [place], [place] [address] Phone # [phone] Fax # [phone]

## 2023-05-31 NOTE — Therapy (Signed)
 OUTPATIENT PHYSICAL THERAPY NEURO TREATMENT Patient Name: Matthew [name] MRN: [id number] DOB:25-May-1950, 73 y.o., male Today's Date: 06/04/2023   PCP: Okey Carlin Redbird, MD REFERRING PROVIDER: Tat, Asberry RAMAN, DO    END OF SESSION:  PT End of Session - 06/04/23 1447     Visit Number 15    Number of Visits 18    Date for PT Re-Evaluation 06/25/23    Authorization Type [employer]    Progress Note Due on Visit 20    PT Start Time 1403    PT Stop Time 1446    PT Time Calculation (min) 43 min    Activity Tolerance Patient tolerated treatment well    Behavior During Therapy WFL for tasks assessed/performed                    Past Medical History:  Diagnosis Date   Anxiety    Arthritis    GERD (gastroesophageal reflux disease)    occ remote history   Hypertension    Parkinson disease (HCC)    Prostate cancer (HCC)    Right knee pain    questionable meniscal tear   Seasonal allergies    Past Surgical History:  Procedure Laterality Date   COLONOSCOPY     Leg Laceration Repair  Left    Age 50   PELVIC LYMPH NODE DISSECTION Bilateral 03/22/2018   Procedure: PELVIC LYMPH NODE DISSECTION;  Surgeon: Devere Lonni Righter, MD;  Location: [facility];  Service: Urology;  Laterality: Bilateral;   PROSTATE BIOPSY     ROBOT ASSISTED LAPAROSCOPIC RADICAL PROSTATECTOMY N/A 03/22/2018   Procedure: XI ROBOTIC ASSISTED LAPAROSCOPIC PROSTATECTOMY;  Surgeon: Devere Lonni Righter, MD;  Location: [facility];  Service: Urology;  Laterality: N/A;   TOTAL KNEE ARTHROPLASTY Right 05/05/2019   Procedure: RIGHT TOTAL KNEE ARTHROPLASTY;  Surgeon: Liam Lerner, MD;  Location: [facility];  Service: Orthopedics;  Laterality: Right;   Patient Active Problem List   Diagnosis Date Noted   S/P TKR (total knee replacement), right 05/05/2019   Osteoarthritis of right knee 05/02/2019   Prostate cancer (HCC) 03/22/2018   Malignant neoplasm of prostate (HCC) 02/27/2018    ONSET DATE: PD  2021  REFERRING DIAG: G20.A2 (ICD-10-CM) - Parkinson's 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   Pt accompanied by: self  PERTINENT HISTORY: PD, R TKR, prostate CA, HTN  PAIN:  Are you having pain? Yes: NPRS scale: 3-4/10 Pain location: midback Pain description: dull Aggravating factors: certain movements/times Relieving factors: rest  PRECAUTIONS: Fall  RED FLAGS: None   WEIGHT BEARING RESTRICTIONS: No  FALLS: Has patient fallen in last 6 months? Yes. Number of falls several  LIVING ENVIRONMENT: Lives with: lives with their family Lives in: House/apartment Stairs: Yes, 2-3 steps to enter Has following equipment at home: Vannie - 4 wheeled and hospital bed  PLOF: Needs assistance with ADLs, Needs assistance with transfers, and bed mobility  PATIENT GOALS: improve balance, mobility, strength. Get as normal as I can. Be able to step up into son's Jeep  OBJECTIVE:     TODAY'S TREATMENT: 06/04/23 Activity Comments  Nustep L5 x 6 min UEs/LEs  Cues for larger amplitude   Sitting lateral scooting Verbal and ,manual cues for increased amplitude and use pof LEs for scoot   Sit>supine Min A for LE management; had to remind pt how to safely get into bed; also required cues for scooting in supine   horizontal ABD 10x  B shoulder ER 10x  Initially attempted in sitting but this was too challenging; better success in supine   Using yellow TB; cues for  eccentric control *reports a little pain in the R anterior shoulder after completing   Sidelying open book stretch 10x each PT stabilizing knees down; cues to look at hand; reported R buttock pain at end range R trunk rotation   R sidelying>sit Mod A and verbal cueing for sequencing   TB row 2x10  Red TB, standing ; gentle PT OP/support at shoulder blades          HOME EXERCISE PROGRAM Access Code: [email or web address] URL: [email or web address] Date: 06/04/2023 Prepared by: [facility] - Outpatient  Rehab - [facility]  Exercises - Supine Hamstring Stretch with Strap  - 1 x daily - 5 x weekly - 2 sets - 30 sec hold - Supine Piriformis Stretch with Foot on Ground  - 1 x daily - 5 x weekly - 2 sets - 30 sec hold - Supine Figure 4 Piriformis Stretch  - 1 x daily - 5 x weekly - 2 sets - 30 sec hold - Standing Shoulder Row with Anchored Resistance  - 1 x daily - 5 x weekly - 2 sets - 10 reps    PATIENT EDUCATION: Education details: HEP update with edu for safety  Person educated: Patient Education method: Explanation, Demonstration, Tactile cues, Verbal cues, and Handouts Education comprehension: verbalized understanding and returned demonstration     Note: Objective measures were completed at Evaluation unless otherwise noted.  DIAGNOSTIC FINDINGS: n/a for this episode  COGNITION: Overall cognitive status:  notes increased issues with memory   SENSATION: Not tested, denies parasthesias  COORDINATION: Deficits with rapid alternating movements Finger to nose intact    MUSCLE TONE: BLE rigidity  MUSCLE LENGTH: Hamstrings: Right -30 deg; Left -30 deg   DTRs:  NT  POSTURE: rounded shoulders, forward head, increased thoracic kyphosis, and flexed trunk   LOWER EXTREMITY ROM:     Active  Right Eval Left Eval  Hip flexion    Hip extension    Hip abduction    Hip adduction    Hip internal rotation    Hip external rotation    Knee flexion    Knee  extension -  30 -30  Ankle dorsiflexion 10 10  Ankle plantarflexion    Ankle inversion    Ankle eversion     (Blank rows = not tested)  LOWER EXTREMITY MMT:    BLE 4/5 gross strength  BED MOBILITY:  Moderate assist  TRANSFERS: Assistive device utilized: Environmental Consultant - 4 wheeled  Sit to stand: Complete Independence and Modified independence Stand to sit: Complete Independence Chair to chair: Complete Independence and Modified independence Floor:  NT    CURB:  Level of Assistance: CGA Assistive device utilized: Environmental Consultant - 4 wheeled Curb Comments:   STAIRS: Reports stairs to 2nd floor but does not use 2-3 stairs to enter home  GAIT: Gait pattern: knee flexed in stance- Right and knee flexed in stance- Left Distance walked:  Assistive device utilized: Environmental Consultant - 4 wheeled Level of assistance: Modified independence and SBA Comments:   FUNCTIONAL TESTS:  Mini-BESTest  11/28  Timed Up and Go test:26 sec with 4WW (compared to 15 sec)   10 meter walk test:15.31 sec = 2.14 ft/sec  (compared to 2.01 ft/sec)   5 time sit to stand test: 23 sec-(compared to 11 sec)    PATIENT EDUCATION: Education details: assessment details, rationale of intervention Person educated: Patient Education method: Explanation Education comprehension: verbalized understanding  HOME EXERCISE PROGRAM: TBD-reports daily performance 20 reps standing PWR! moves  GOALS: Goals reviewed with patient? Yes  SHORT TERM GOALS: Target date: 04/18/2023    Patient will be independent in HEP to improve functional outcomes Baseline: pt demo standing PWR! Moves + sit to stand Goal status: MET 04/17/2023  2.  Demo improved BLE strength and balance per time 15 sec 5xSTS test Baseline: 23>15.41 sec 04/17/2023 Goal status: MET 04/17/2023  3.  Demo improved safety with ambulation per time 15 sec TUG test Baseline: 34.53 sec>29.53 sec Goal status: IN PROGRESS    LONG TERM GOALS: Target date: 05/16/2023     Improve hamstring flexibility to -15 degrees to reduce rigidity and enable greater knee extension ROM for mobility Baseline: -30 Goal status: IN PROGRESS  2.  Reduce risk for falls per score 20/28 Mini-BESTest Baseline: 11/20 Goal status: IN PROGRESS  3.  Demo modified independent bed mobility to reduce level of assist from caregivers  Baseline: moderate assist  Goal status: IN PROGRESS  ASSESSMENT:  CLINICAL IMPRESSION:  Patient arrived to session with report of some midback pain. Addressed this with postural strengthening and thoracic mobility exercises Supine positioning was preferred d/t shoulder weakness limiting ROM with sitting exercises. Also provided cues and assist for bed mobility and scooting as this is challenging for patient. Patient reported improved sensation of mobility after today's activities and reported no pain upon leaving.   OBJECTIVE IMPAIRMENTS: Abnormal gait, decreased activity tolerance, decreased balance, decreased coordination, decreased mobility, difficulty walking, decreased ROM, decreased strength, impaired flexibility, impaired tone, improper body mechanics, postural dysfunction, and pain.   ACTIVITY LIMITATIONS: carrying, lifting, bending, standing, squatting, stairs, transfers, bed mobility, reach over head, and locomotion level  PARTICIPATION LIMITATIONS: meal prep, cleaning, laundry, interpersonal relationship, shopping, community activity, yard work, and hobbies of music  PERSONAL FACTORS: Age, Past/current experiences, Time since onset of injury/illness/exacerbation, and 1-2 comorbidities: PMH  are also affecting patient's functional outcome.   REHAB POTENTIAL: Good  CLINICAL DECISION MAKING: Evolving/moderate complexity  EVALUATION COMPLEXITY: Moderate  PLAN:  PT FREQUENCY: 2x/week  PT DURATION: 8 weeks  PLANNED INTERVENTIONS: 97110-Therapeutic exercises, 97530- Therapeutic activity, [id number]- Neuromuscular re-education, 97535- Self Care,  [id number]- Manual therapy, [id number]- Gait training,  02239- Orthotic Fit/training, 04007- Canalith repositioning, 02886- Aquatic Therapy, Patient/Family education, Balance training, Stair training, Taping, Dry Needling, Joint mobilization, Spinal mobilization, Vestibular training, DME instructions, Cryotherapy, and Moist heat  PLAN FOR NEXT SESSION: Continue to incorporate postural strengthening.  Seated leg strengthening, sit to stand from varied surfaces.  Review bed mobility techniques with wife present; review standing PWR moves HEP, supine power moves and stretching.  Work towards LTGS and make sure pt has an exercise plan for when PT discharges (emphasize [facility] Tuesday pm PWR! Moves as an option)    Louana Terrilyn Christians, PT, DPT 06/04/23 2:48 PM  [facility] at [facility] [address] [place], [address] [place], [place] [address] Phone # [phone] Fax # [phone]

## 2023-05-31 NOTE — Therapy (Signed)
 OUTPATIENT SPEECH LANGUAGE PATHOLOGY PARKINSON'S TREATMENT/RECERTIFICATION   Patient Name: Matthew [name] MRN: [id number] DOB:June 12, 1950, 73 y.o., male Today's Date: 05/31/2023  PCP: Okey Dunnings, MD REFERRING PROVIDER: Evonnie Stabs, DO  END OF SESSION:  End of Session - 05/31/23 1601     Visit Number 11    Number of Visits 17    Date for SLP Re-Evaluation 06/22/23    SLP Start Time 1533    SLP Stop Time  1612    SLP Time Calculation (min) 39 min    Activity Tolerance Patient tolerated treatment well              Past Medical History:  Diagnosis Date   Anxiety    Arthritis    GERD (gastroesophageal reflux disease)    occ remote history   Hypertension    Parkinson disease (HCC)    Prostate cancer (HCC)    Right knee pain    questionable meniscal tear   Seasonal allergies    Past Surgical History:  Procedure Laterality Date   COLONOSCOPY     Leg Laceration Repair  Left    Age 54   PELVIC LYMPH NODE DISSECTION Bilateral 03/22/2018   Procedure: PELVIC LYMPH NODE DISSECTION;  Surgeon: Devere Lonni Righter, MD;  Location: [facility];  Service: Urology;  Laterality: Bilateral;   PROSTATE BIOPSY     ROBOT ASSISTED LAPAROSCOPIC RADICAL PROSTATECTOMY N/A 03/22/2018   Procedure: XI ROBOTIC ASSISTED LAPAROSCOPIC PROSTATECTOMY;  Surgeon: Devere Lonni Righter, MD;  Location: [facility];  Service: Urology;  Laterality: N/A;   TOTAL KNEE ARTHROPLASTY Right 05/05/2019   Procedure: RIGHT TOTAL KNEE ARTHROPLASTY;  Surgeon: Liam Lerner, MD;  Location: [facility];  Service: Orthopedics;  Laterality: Right;   Patient Active Problem List   Diagnosis Date Noted   S/P TKR (total knee replacement), right 05/05/2019   Osteoarthritis of right knee 05/02/2019   Prostate cancer (HCC) 03/22/2018   Malignant neoplasm of prostate (HCC) 02/27/2018   Speech Therapy Progress Note  Dates of Reporting Period: 03/21/23 to present  Subjective Statement: Pt has been seen for 11 ST sessions  focusing on speech loudness.  Objective: See below.  Goal Update: See below.  Plan: See pt for no more than 6 more sessions.  Reason Skilled Services are Required: Pt has not maximized rehab potential to date.   ONSET DATE: script dated 03-06-23  REFERRING DIAG: Parkinson's Disease  THERAPY DIAG:  Dysarthria and anarthria  Cognitive communication deficit  Aphasia  Rationale for Evaluation and Treatment: Rehabilitation  SUBJECTIVE:   SUBJECTIVE STATEMENT: I don't  know (pt, when asked what he thought about this being his last scheduled.)  Pt accompanied by: self  PERTINENT HISTORY: Pt well-known to this SLP from previous courses of ST. MBS in August 2023- results below. Pt's last ST course resulted in pt partially meeting goal for producing speech in upper 60s dB in simple to mod complex with occasional min A.  PAIN:  Are you having pain? No  FALLS: Has patient fallen in last 6 months?  See PT evaluation for details  PATIENT GOALS: Improve loudness and fluency  OBJECTIVE:   DIAGNOSTIC FINDINGS:  MBS 12-22-21 Patient presents with functional oropharyngeal swallowing without aspiration of any consistency tested. He does demonstrate minimal asymptomatic laryngeal penetration of thin liquid due to decreased timing of laryngeal closure. Chin tuck posture with use of straw effective to prevent penetration. Pharyngeal swallow is strong without retention. Oral transiting discoordination noted with pt swallowing barium tablet as he required 2nd  bolus of thin to transit through oropharynx. Upon esophageal sweep, pt appeared with barium tablet retained in esophagus with pt awareness. Tablet appeared to transit distally after several boluses of liquids. After tablet had transited, pt continued with "phantom" sensation to esophagus. Recommend pt continue diet as tolerated, using chin tuck with liquids if improves comfort, decreases cough. Also encouraged pt to assure maintains strength  of voice, cough and expectoration for airway protection.    PATIENT REPORTED OUTCOME MEASURES (PROM): Communication Effectiveness Survey: pt returned CES on 04/12/23 with score of 18/40, with lower scores indicating more impact of pt's deficits on daily communication situations.   TODAY'S TREATMENT:                                                                                                                                         DATE:  05/31/23: Pt average with loud /a/ 84 dB. Pt completed everyday sentences with average 81dB. In short conversational segments of 2-5 minutes pt req'd occasional min-mod A for loudness. SLP moved downward in hierarchy to 60-90 second segments and pt req'd occasional min A for WNL loudness. Pt needs more appointments due to missed visits.  05/14/23: Pt conversation mid 60s in 5 minutes conversation. I just can't get loud - this thing really did me in, pt stated. SLP educated pt on how illness can affect people re: deconditioning and this may be worse for people with PD than people who do not have PD. Pt produced loud /a/ with mid 70s dB initially and with initial min cues from SLP improved this to average 82 dB. In short (1 minute) conversational segments/monologues, pt req'd occasional min A. This is not as successful as last 1-2 sessions.SLP encouraged pt to get back to practicing /a/ ASAP and he should improve average dB and duration within a few days. Pt agreed.   05/03/23: SLP and pt engaged in conversation for ~5 minute conversational segments x5 today, pt maintained average of 68dB with occasional min A for loudness. Pt endorses his loudness is better at home in the last week.   04/26/23: Pt talked with SLP in simple conversation for 15 minutes with rare min A for loudness. Loud /a/ targeted today to make louder speech more habitual with average loudness 83dB, with rare min A for loudness. Everyday sentences read with average loudness 80dB with rare min-mod cues  for loudness. Conversation afterwards (simple to mod complex) of 15 minutes averaged 67dB with occasional min A for loudness/effort.  04/24/23: Pt taking his PD meds just prior to ST. Pt states he performed loud /a/ and sentences 5/7 days. Loud /a/ targeted today to make louder speech more habitual. Average loudness 85dB, with rare incr'd to occasional min A for loudness. Everyday sentences read with average loudness 80dB with occasional min-mod cues for loudness. Pt req'd usual min-mod cues for loudness in 3-4 minute conversational segments. SLP encouraged pt  to cont to practice consistently.   04/17/23: Pt states family is asking him less frequently for repeats. Today in short conversation segments of 3 minutes, pt maintained upper 60s dB average for 4 segments and then req'd incr'ing level of cueing to occasional min-mod A by 7th segment. SLP encouraged pt to cont to practice at home.   04/12/23: Loud /a/ targeted today to make louder speech more habitual. Average loudness 88dB, with usual min A for loudness. Everyday sentences read with average loudness 82dB with usual mod cues for loudness and tactile cues of abdominal recruitment. In sentence responses pt req'd min-mod A for loudness, occasionally. Pt appeared fatigued from previous therapies today and told SLP affirmatively he was fatigued when SLP asked him. SLP provided homework for sentence responses. CES returned today - results above.   04/10/23: Loud /a/ targeted today to make louder speech more habitual. Average loudness 85dB. Everyday sentences read with average loudness 83dB. SLP engaged in conversational topics (3-4 minutes) x5; SLP req'd to provide min-mod cues, rarely - which increased to mod cues occasionally by last topic. Pt acknowledged he was fatigued from PT where they worked on bed mobility. Pt provided 2 sentence responses with average 65dB with rare min-mod A for loudness. Other multiple sentence responses were challenging today  due to decr'd divided attention negatively impacting pt performance today more than previous sessions, likely due to fatigue factor. SLP encouraged pt to cont to practice at home. I always practice with my grandkids because I don't want them to have to ask me to repeat like [name] does.  04/03/23: SLP engaged pt with loud /a/ to encourage muscle memory to make louder speech more habitual. Average loudness 85dB. Initial cues for loudness but pt independent after that. Everyday sentences were read with average 81dB.  Word level responses with higher cognitive load completed with average 70dB.  03/27/23: Pt arrived and greeted SLP with 70dB average, and then decr'd to a soft volume once he sat down in ST room (average 64dB). Initial cues for louder speech and tactile cue for feeling strength in his voice from abdomen, thinking about effort for /a/.  SLP engaged pt in conversation for 38 minutes, with conversational segments of 8-13 minutes, x4. SLP req'd occasional mod cues for pt to improve loudness, faded to occasional min-mod cues. Pt demonstrated decr'd stamina approx 32 minutes into conversational segments. SLP reiterated the necessity to perform loud /a/ at home as prescribed when pt went to lobby to meet wife.   03/21/23 (eval): SPEECH: SLP explained eval results to pt. SLP ensured pt knew the importance of daily loud /a/ and everyday sentences practice BID.  SWALLOWING: Additionally, he and SLP practiced chin down posture with a straw and with a cup.  PATIENT EDUCATION: Education details: see today's treatment Person educated: Patient Education method: Explanation and Demonstration Education comprehension: verbalized understanding  HOME EXERCISE PROGRAM: Loud /a/ and everyday sentences.    GOALS: Goals reviewed with patient? No  SHORT TERM GOALS: Target date: 04/20/23  Produce /a/ with average mid-upper 80s dB over three sessions Baseline: 04/12/23 Goal status: Partially met  2.   pt will demo abdominal breathing with sentence responses 85% accuracy x 3 sessions  Baseline:  Goal status: not addressed yet  3.  pt will generate 2 minutes simple conversation with upper 60s dB average in 2 sessions  Baseline: 04/17/23 Goal status: Partially met  4.  Pt will use strategies for dysfluency in 1-2 minute short conversational segments  Baseline:  Goal status: deferred - dysfluency not heard in sessions thus far   LONG TERM GOALS: Target date: 05/20/23, 06/29/23  pt will produce average upper 60s dB in 10 minutes simple to mod complex conversation in 5 sessions  Baseline:  Goal status: Not met, and continue  2.  pt will maintain upper 60s dB average in 10 minute simple conversation x 3 sessions  Baseline: 04/26/23 Goal status: Partially met, and continue  3.  pt will use abdominal breathing >80% of the time during 10 minute simple conversation in 3 sessions  Baseline: 04/26/23 Goal status: Partially met, and continue  4.  pt will report fewer requests to repeat himself than prior to ST in 3 sessions  Baseline:  Goal status: Not met, and continue  5.  Pt will use strategies for safer swallowing in 3 sessions with modified independence  Baseline:  Goal status: Not met, and continue  6.  Pt will score higher/better on PROM in the last 2 weeks of therapy Baseline:  Goal status: deferred - will perform on last visit  ASSESSMENT:  CLINICAL IMPRESSION: RECERT TODAY. Patient is a 73 y.o. male who was seen today for treatment of dysarthria in light of Parkinsons Disease. See today's treatment for more details, and Long Term Goals for more information. He has not been seen for the total number of visits due to scheduling and attendance issues (pt ill, SLP schedule). Pt told SLP on eval date that family members ask him to repeat frequently, and he frequency of communicating in social situations has decr'd since last session of previous therapy course. Additionally when pt  used chin tuck posture with liquids as told on MBS in August 2023, he felt safer.  OBJECTIVE IMPAIRMENTS: Objective impairments include executive functioning, aphasia, dysarthria, and dysphagia. These impairments are limiting patient from household responsibilities, ADLs/IADLs, effectively communicating at home and in community, and safety when swallowing.Factors affecting potential to achieve goals and functional outcome are  none .SABRA Patient will benefit from skilled SLP services to address above impairments and improve overall function.  REHAB POTENTIAL: Good  PLAN:  SLP FREQUENCY: 1-2x/week  SLP DURATION:  6 sessions  PLANNED INTERVENTIONS: Aspiration precaution training, Diet toleration management , Environmental controls, Cueing hierachy, Cognitive reorganization, Internal/external aids, Oral motor exercises, Functional tasks, Multimodal communication approach, SLP instruction and feedback, Compensatory strategies, and Patient/family education    [facility], CCC-SLP 05/31/2023, 4:33 PM

## 2023-05-31 NOTE — Therapy (Signed)
 OUTPATIENT OCCUPATIONAL THERAPY PARKINSON'S Treatment Note  Patient Name: Matthew [name] MRN: [id number] DOB:1950-07-12, 73 y.o., male Today's Date: 05/31/2023  PCP: Okey Carlin Redbird, MD REFERRING PROVIDER: Tat, Asberry RAMAN, DO   END OF SESSION:  OT End of Session - 05/31/23 1415     Visit Number 11    Number of Visits 20    Date for OT Re-Evaluation 06/29/23    Authorization Type [employer]    OT Start Time 1409    OT Stop Time 1445    OT Time Calculation (min) 36 min                       Past Medical History:  Diagnosis Date   Anxiety    Arthritis    GERD (gastroesophageal reflux disease)    occ remote history   Hypertension    Parkinson disease (HCC)    Prostate cancer (HCC)    Right knee pain    questionable meniscal tear   Seasonal allergies    Past Surgical History:  Procedure Laterality Date   COLONOSCOPY     Leg Laceration Repair  Left    Age 69   PELVIC LYMPH NODE DISSECTION Bilateral 03/22/2018   Procedure: PELVIC LYMPH NODE DISSECTION;  Surgeon: Devere Lonni Righter, MD;  Location: [facility];  Service: Urology;  Laterality: Bilateral;   PROSTATE BIOPSY     ROBOT ASSISTED LAPAROSCOPIC RADICAL PROSTATECTOMY N/A 03/22/2018   Procedure: XI ROBOTIC ASSISTED LAPAROSCOPIC PROSTATECTOMY;  Surgeon: Devere Lonni Righter, MD;  Location: [facility];  Service: Urology;  Laterality: N/A;   TOTAL KNEE ARTHROPLASTY Right 05/05/2019   Procedure: RIGHT TOTAL KNEE ARTHROPLASTY;  Surgeon: Liam Lerner, MD;  Location: [facility];  Service: Orthopedics;  Laterality: Right;   Patient Active Problem List   Diagnosis Date Noted   S/P TKR (total knee replacement), right 05/05/2019   Osteoarthritis of right knee 05/02/2019   Prostate cancer (HCC) 03/22/2018   Malignant neoplasm of prostate (HCC) 02/27/2018    ONSET DATE: referral date 03/06/23  REFERRING DIAG: G20.A2 (ICD-10-CM) - Parkinson's disease without dyskinesia, with fluctuating  manifestations  THERAPY DIAG:  Other symptoms and signs involving the nervous system  Muscle weakness (generalized)  Unsteadiness on feet  Other lack of coordination  Abnormal posture  Rationale for Evaluation and Treatment: Rehabilitation  SUBJECTIVE:   SUBJECTIVE STATEMENT: Pt reports still feeling a little weak after being sick in December but feeling overall much better.  Pt reports sometimes feeling like he has a hard time waking up.  Pt accompanied by: self and significant other  PERTINENT HISTORY: PD, anxiety, arthritis, GERD, HTN, R TKR   PRECAUTIONS: Fall  WEIGHT BEARING RESTRICTIONS: No  PAIN:  Are you having pain? No  FALLS: Has patient fallen in last 6 months? Yes. Number of falls Pt reports having a time where he fell frequently but has fallen ~3 times in the last few months with most recent being Saturday.  LIVING ENVIRONMENT: Lives with: lives with their spouse Lives in: House/apartment Stairs:  Yes: Internal: full flight of step, but able to stay on main floor without need to navigate steps; and External: 2 steps; on right going up Has following equipment at home: Single point cane, Quad cane small base, Walker - 2 wheeled, Environmental Consultant - 4 wheeled, Wheelchair (manual), shower chair, Grab bars, and transport chair - for dr appts  PLOF: Requires assistive device for independence and Needs assistance with ADLs  PATIENT GOALS: to be as close  to normal as possible  OBJECTIVE:  Note: Objective measures were completed at Evaluation unless otherwise noted.  HAND DOMINANCE: Right  ADLs: Overall ADLs: reports overall slowing, wife is second pair of hands Transfers/ambulation related to ADLs: utilizing small 4 wheeled walker for all mobility  Eating: wife assisting with cutting foods, pt spilling foods off utensil, constantly spilling things off the plate, having to use finger to stabilize food when scooping with R hand Grooming: utilizing electric  toothbrush and water  pick for oral care, difficulty with brushing hair  UB Dressing: primarily wearing t-shirts/pull over shirts, occasional assist from wife if in a hurry or get stuck, no longer wearing button up shirts anymore.  Wife will assist with donning shirt if completing after shower LB Dressing: difficulty with reaching forward towards feet when donning pants, socks, shoes; increased difficulty when crossing leg , no longer wearing lace up shoes Toileting: requires momentum to stand up from toilet or will reach towards door for leverage/momentum, reports struggle with hygiene  Bathing: occasional assist for thoroughness when drying off  Tub Shower transfers: wife provides supervision, handle is just inside door and pt able to hold on to it while stepping over small ledge Equipment: Grab bars, Walk in shower, and Long handled sponge  IADLs: Spouse completes all shopping, meal prep, and housekeeping tasks. Community mobility: Relies on family or friends for transportation Medication management: pt fills pill box and is able to take correct dosages at correct time Financial management: wife completes and did prior Handwriting:  TBA  MOBILITY STATUS: Hx of falls and difficulty carrying objections with ambulation  POSTURE COMMENTS:  forward head  ACTIVITY TOLERANCE: Activity tolerance: diminished  FUNCTIONAL OUTCOME MEASURES: Physical performance test: PPT#2 (simulated eating) 25.91 & PPT#4 (donning/doffing jacket): TBA  COORDINATION: 9 Hole Peg test: Right: 1:36.03 sec; Left: 58.28 sec  UE ROM:   B shoulder flexion ~120 *, reports mild discomfort in R upper arm with internal rotation but able to achieve full ROM  UE MMT:    grossly 4/5 bilaterally  MUSCLE TONE: RUE: Mild and LUE: Mild  COGNITION: Overall cognitive status:  slower processing and speech  OBSERVATIONS: Bradykinesia   TODAY'S TREATMENT:                                                                                  DATE:  05/31/23 Large amplitude: engaged in full arm extension with elbows and wrists extended and fingers open 10x2 with focus on large amplitude and anterior weight shift.  Engaged in wrist flexion/extension and forearm supination/pronation with cues for amplitude x10 each.  Engaged in large amplitude reaching out to R and L in sitting x10, utilized poles to facilitate increased reach x10 with good amplitude. Engaged in reaching across midline, utilizing poles to facilitate increased reach for 2 sets of 10. Simulated dressing: utilizing Hula hoop in BUE to reach overhead to simulate UB dressing (with donning shirt overhead), completing 3 sets of 5.  Pt demonstrating improvements in sitting posture when reaching BUE over head, however then quickly returning to pelvic tilt when bringing arms back to midline.  Transitioned to simulating LB dressing while holding Hula hoop in BUE and then reaching  towards feet and stepping over hula hoop with alternating LE, completing 2 sets of 5.  Pt required repositioning of UE to increase ease when reaching towards feet.     05/14/23 Large amplitude hands: engaged in full arm extension, with elbows and wrist extended and fingers open x10 followed by finger flicks with focus on large amplitude.  OT providing demonstration for increased sequencing and amplitude.   Self-feeding: 28.06 sec 9 hole peg test: R: 1:39.5 and L: 1:16.31 sec Self-care: engaged in lengthy discussion about bathroom setup and possibility of additional equipment to increase safety and independence with self-care tasks of bathing, dressing, and toileting.  Discussed current equipment in bathroom (use of shower chair for dressing/drying after shower).  OT educated on possibility of benefit from grab bars in toilet room/water  closet.    05/08/23 AE: recommend use of stylus for phone use.  Discussed cell phone holder with stylus attachment to increase use and be able to keep up with it.    Large amplitude: finger flicks and thumb opposition.  OT providing demonstration and cues for upright postural control and large amplitude movements during each exercise. Handwriting: to improve large amplitude writing strokes, to improve RUE FM coordination and dexterity for handwriting tasks.  Pt writing names of favorite Christmas carols requiring increased time.  Pt initially with large, legible words, however overtime pt handwriting decreasing in size to becoming more illegible.  OT encouraged pt to utilize spacing on lined paper to increase letter size. OT providing cues for big letters.  Engaged in writing in both cursive as well as print, benefiting from cues for big letters. Pt verbalized understanding.     PATIENT EDUCATION: Education details: condition specific education, large amplitude exercises Person educated: Patient Education method: Explanation Education comprehension: needs further education  HOME EXERCISE PROGRAM: Access Code: [email or web address] URL: [email or web address] Date: 04/10/2023 Prepared by: [facility] Out[facility]  Re[facility] - [facility]  Exercises - Seated Shoulder Shrugs  - 2 x daily - 5 reps - Seated Scapular Retraction  - 2 x daily - 5 reps - Seated Finger Flicks with Elbow Extension  - 1 x daily - 2 sets - 5-10 reps - Seated Reaching to Side and Across Body  - 1 x daily - 2 sets - 5-10 reps - Seated Reaching to Side  - 1 x daily - 2 sets - 5-10 reps  GOALS: Goals reviewed with patient? No  SHORT TERM GOALS: Target date: 06/08/23  Pt will be Independent with PD specific HEP. Baseline: Goal status: IN PROGRESS   LONG TERM GOALS: Target date: 06/29/23  Pt will demonstrate increased functional ROM and use of BUE to aid in clothing management and hygiene with toileting tasks.  Baseline:  Goal status: IN PROGRESS  2.  Pt will demonstrate improved ability to get up from lower seat/toilet without assistance with DME PRN. Baseline:  Goal  status: IN PROGRESS  3.  Pt will demonstrate improved fine motor coordination for ADLs as evidenced by decreasing 9 hole peg test score for RUE by 10 secs on R and 1 or fewer drops. Baseline: R: 1:36 and L: 58 sec.  Pt reports dropping small items frequently Goal status: IN PROGRESS  4.  Pt will demonstrate improved ease with feeding as evidenced by decreasing PPT#2 (self feeding) by 5 secs  Baseline: 25.91 Goal status: IN PROGRESS  5.  Pt will demonstrate and/or report improved independence with LB dressing with use of AE/DME as needed.  Baseline:  Goal status: IN  PROGRESS  ASSESSMENT:  CLINICAL IMPRESSION: Pt demonstrating good engagement in all large amplitude focused tasks this session.  Pt benefiting from use of poles for target to further facilitate amplitude and reaching.  Pt also with good trunk control and ability to come into upright sitting posture during simulated UB dressing, however with decreased ability to maintain position.  PERFORMANCE DEFICITS: in functional skills including ADLs, IADLs, coordination, ROM, strength, flexibility, Fine motor control, Gross motor control, balance, body mechanics, endurance, decreased knowledge of precautions, decreased knowledge of use of DME, and UE functional use and psychosocial skills including coping strategies, environmental adaptation, and routines and behaviors.   IMPAIRMENTS: are limiting patient from ADLs, IADLs, and rest and sleep.     PLAN:  OT FREQUENCY: 1-2x/week  OT DURATION: 6 weeks  PLANNED INTERVENTIONS: 97168 OT Re-evaluation, 97535 self care/ADL training, 02889 therapeutic exercise, 97530 therapeutic activity, 97112 neuromuscular re-education, 97035 ultrasound, 97010 moist heat, 97010 cryotherapy, functional mobility training, psychosocial skills training, energy conservation, coping strategies training, patient/family education, and DME and/or AE instructions  RECOMMENDED OTHER SERVICES: NA  CONSULTED AND AGREED  WITH PLAN OF CARE: Patient  PLAN FOR NEXT SESSION: Review and add to large amplitude exercises, initiate bag exercises with focus on improved shoulder ROM, FMC tasks/HEP, Focus on sit > stand and standing balance.  Educate on modifications and DME/AE to aid in safety and independence with self-care tasks.   KAYLENE DOMINO, OTR/L 05/31/2023, 2:16 PM   [facility] at [facility] [address] [place], [address] [place], [place] [address] Phone # [phone] Fax # [phone]

## 2023-06-04 ENCOUNTER — Ambulatory Visit: Payer: PPO | Admitting: Physical Therapy

## 2023-06-04 ENCOUNTER — Encounter: Payer: Self-pay | Admitting: Physical Therapy

## 2023-06-04 ENCOUNTER — Ambulatory Visit: Payer: PPO | Admitting: Occupational Therapy

## 2023-06-04 DIAGNOSIS — R2689 Other abnormalities of gait and mobility: Secondary | ICD-10-CM

## 2023-06-04 DIAGNOSIS — R293 Abnormal posture: Secondary | ICD-10-CM

## 2023-06-04 DIAGNOSIS — R29818 Other symptoms and signs involving the nervous system: Secondary | ICD-10-CM

## 2023-06-04 DIAGNOSIS — R278 Other lack of coordination: Secondary | ICD-10-CM

## 2023-06-04 DIAGNOSIS — M6281 Muscle weakness (generalized): Secondary | ICD-10-CM

## 2023-06-04 DIAGNOSIS — R2681 Unsteadiness on feet: Secondary | ICD-10-CM

## 2023-06-04 NOTE — Patient Instructions (Signed)
   Utilize step stool to prop feet on while donning pants/underwear Try rolling waist band of Depends down to decrease amount of fabric when placing foot into leg hole Could also benefit from use of reacher to aid in pulling pants over heel and/or over toes

## 2023-06-04 NOTE — Therapy (Signed)
 OUTPATIENT OCCUPATIONAL THERAPY [facility]IN[facility] Treatment Note  Patient Name: Matthew [name] MRN: [id number] DOB:Mar 20, 1951, 73 y.o., male Today's Date: 06/04/2023  PCP: Okey Carlin Redbird, MD REFERRING PROVIDER: Tat, Asberry RAMAN, DO   END OF SESSION:  OT End of Session - 06/04/23 1426     Visit Number 12    Number of Visits 20    Date for OT Re-Evaluation 06/29/23    Authorization Type [employer]    OT Start Time 1317    OT Stop Time 1400    OT Time Calculation (min) 43 min                        Past Medical History:  Diagnosis Date   Anxiety    Arthritis    GERD (gastroesophageal reflux disease)    occ remote history   Hypertension    Parkinson disease (HCC)    Prostate cancer (HCC)    Right knee pain    questionable meniscal tear   Seasonal allergies    Past Surgical History:  Procedure Laterality Date   COLONOSCOPY     Leg Laceration Repair  Left    Age 36   PELVIC LYMPH NODE DISSECTION Bilateral 03/22/2018   Procedure: PELVIC LYMPH NODE DISSECTION;  Surgeon: Devere Lonni Righter, MD;  Location: [facility];  Service: Urology;  Laterality: Bilateral;   PROSTATE BIOPSY     ROBOT ASSISTED LAPAROSCOPIC RADICAL PROSTATECTOMY N/A 03/22/2018   Procedure: XI ROBOTIC ASSISTED LAPAROSCOPIC PROSTATECTOMY;  Surgeon: Devere Lonni Righter, MD;  Location: [facility];  Service: Urology;  Laterality: N/A;   TOTAL KNEE ARTHROPLASTY Right 05/05/2019   Procedure: RIGHT TOTAL KNEE ARTHROPLASTY;  Surgeon: Liam Lerner, MD;  Location: [facility];  Service: Orthopedics;  Laterality: Right;   Patient Active Problem List   Diagnosis Date Noted   S/P TKR (total knee replacement), right 05/05/2019   Osteoarthritis of right knee 05/02/2019   Prostate cancer (HCC) 03/22/2018   Malignant neoplasm of prostate (HCC) 02/27/2018    ONSET DATE: referral date 03/06/23  REFERRING DIAG: G20.A2 (ICD-10-CM) - Parkinson's disease without dyskinesia, with fluctuating  manifestations  THERAPY DIAG:  Other symptoms and signs involving the nervous system  Other lack of coordination  Muscle weakness (generalized)  Abnormal posture  Rationale for Evaluation and Treatment: Rehabilitation  SUBJECTIVE:   SUBJECTIVE STATEMENT: Pt reports no falls  Pt accompanied by: self and significant other  PERTINENT HISTORY: PD, anxiety, arthritis, GERD, HTN, R TKR   PRECAUTIONS: Fall  WEIGHT BEARING RESTRICTIONS: No  PAIN:  Are you having pain? Yes: NPRS scale: 3-4/10 Pain location: middle of lower back Pain description: dull Aggravating factors: twisting Relieving factors: repositioning  FALLS: Has patient fallen in last 6 months? Yes. Number of falls Pt reports having a time where he fell frequently but has fallen ~3 times in the last few months with most recent being Saturday.  LIVING ENVIRONMENT: Lives with: lives with their spouse Lives in: House/apartment Stairs:  Yes: Internal: full flight of step, but able to stay on main floor without need to navigate steps; and External: 2 steps; on right going up Has following equipment at home: Single point cane, Quad cane small base, Walker - 2 wheeled, Environmental Consultant - 4 wheeled, Wheelchair (manual), shower chair, Grab bars, and transport chair - for dr appts  PLOF: Requires assistive device for independence and Needs assistance with ADLs  PATIENT GOALS: to be as close to normal as possible  OBJECTIVE:  Note: Objective measures were  completed at Evaluation unless otherwise noted.  HAND DOMINANCE: Right  ADLs: Overall ADLs: reports overall slowing, wife is second pair of hands Transfers/ambulation related to ADLs: utilizing small 4 wheeled walker for all mobility  Eating: wife assisting with cutting foods, pt spilling foods off utensil, constantly spilling things off the plate, having to use finger to stabilize food when scooping with R hand Grooming: utilizing electric toothbrush and water  pick for  oral care, difficulty with brushing hair  UB Dressing: primarily wearing t-shirts/pull over shirts, occasional assist from wife if in a hurry or get stuck, no longer wearing button up shirts anymore.  Wife will assist with donning shirt if completing after shower LB Dressing: difficulty with reaching forward towards feet when donning pants, socks, shoes; increased difficulty when crossing leg , no longer wearing lace up shoes Toileting: requires momentum to stand up from toilet or will reach towards door for leverage/momentum, reports struggle with hygiene  Bathing: occasional assist for thoroughness when drying off  Tub Shower transfers: wife provides supervision, handle is just inside door and pt able to hold on to it while stepping over small ledge Equipment: Grab bars, Walk in shower, and Long handled sponge  IADLs: Spouse completes all shopping, meal prep, and housekeeping tasks. Community mobility: Relies on family or friends for transportation Medication management: pt fills pill box and is able to take correct dosages at correct time Financial management: wife completes and did prior Handwriting:  TBA  MOBILITY STATUS: Hx of falls and difficulty carrying objections with ambulation  POSTURE COMMENTS:  forward head  ACTIVITY TOLERANCE: Activity tolerance: diminished  FUNCTIONAL OUTCOME MEASURES: Physical performance test: PPT#2 (simulated eating) 25.91 & PPT#4 (donning/doffing jacket): TBA  COORDINATION: 9 Hole Peg test: Right: 1:36.03 sec; Left: 58.28 sec  UE ROM:   B shoulder flexion ~120 *, reports mild discomfort in R upper arm with internal rotation but able to achieve full ROM  UE MMT:    grossly 4/5 bilaterally  MUSCLE TONE: RUE: Mild and LUE: Mild  COGNITION: Overall cognitive status:  slower processing and speech  OBSERVATIONS: Bradykinesia   TODAY'S TREATMENT:                                                                                 DATE:   06/04/23 Large amplitude: engaged in bag exercises with focus on large amplitude, opening of shoulders and chest in preparation for simulated self-care tasks. LB dressing: utilized bag to simulate LB dressing.  Pt with difficulty reaching towards feet, therefore educated on propping foot on step stool to increase reach towards feet.  Pt demonstrating min-mod improvements with advancing bag over foot, however still with min difficulty advancing bag from heel to toe when doffing.  OT educated on use of reacher to aid in donning pants and/or pulling pants over heel.  OT providing demonstration of rolling waist band towards pant leg to increase ease with donning Depends.  Pt practicing aspects of above with step stool and bag, recommending pt attempt various aspects at home to assess if any increase independence and decrease burden of care.     05/31/23 Large amplitude: engaged in full arm extension with elbows and wrists extended  and fingers open 10x2 with focus on large amplitude and anterior weight shift.  Engaged in wrist flexion/extension and forearm supination/pronation with cues for amplitude x10 each.  Engaged in large amplitude reaching out to R and L in sitting x10, utilized poles to facilitate increased reach x10 with good amplitude. Engaged in reaching across midline, utilizing poles to facilitate increased reach for 2 sets of 10. Simulated dressing: utilizing Hula hoop in BUE to reach overhead to simulate UB dressing (with donning shirt overhead), completing 3 sets of 5.  Pt demonstrating improvements in sitting posture when reaching BUE over head, however then quickly returning to pelvic tilt when bringing arms back to midline.  Transitioned to simulating LB dressing while holding Hula hoop in BUE and then reaching towards feet and stepping over hula hoop with alternating LE, completing 2 sets of 5.  Pt required repositioning of UE to increase ease when reaching towards  feet.     05/14/23 Large amplitude hands: engaged in full arm extension, with elbows and wrist extended and fingers open x10 followed by finger flicks with focus on large amplitude.  OT providing demonstration for increased sequencing and amplitude.   Self-feeding: 28.06 sec 9 hole peg test: R: 1:39.5 and L: 1:16.31 sec Self-care: engaged in lengthy discussion about bathroom setup and possibility of additional equipment to increase safety and independence with self-care tasks of bathing, dressing, and toileting.  Discussed current equipment in bathroom (use of shower chair for dressing/drying after shower).  OT educated on possibility of benefit from grab bars in toilet room/water  closet.   PATIENT EDUCATION: Education details: condition specific education, large amplitude exercises Person educated: Patient Education method: Explanation Education comprehension: needs further education  HOME EXERCISE PROGRAM: Access Code: [email or web address] URL: [email or web address] Date: 04/10/2023 Prepared by: [facility] - Outpatient  Rehab - [facility]  Exercises - Seated Shoulder Shrugs  - 2 x daily - 5 reps - Seated Scapular Retraction  - 2 x daily - 5 reps - Seated Finger Flicks with Elbow Extension  - 1 x daily - 2 sets - 5-10 reps - Seated Reaching to Side and Across Body  - 1 x daily - 2 sets - 5-10 reps - Seated Reaching to Side  - 1 x daily - 2 sets - 5-10 reps  GOALS: Goals reviewed with patient? No  SHORT TERM GOALS: Target date: 06/08/23  Pt will be Independent with PD specific HEP. Baseline: Goal status: IN PROGRESS   LONG TERM GOALS: Target date: 06/29/23  Pt will demonstrate increased functional ROM and use of BUE to aid in clothing management and hygiene with toileting tasks.  Baseline:  Goal status: IN PROGRESS  2.  Pt will demonstrate improved ability to get up from lower seat/toilet without assistance with DME PRN. Baseline:  Goal status: IN PROGRESS  3.   Pt will demonstrate improved fine motor coordination for ADLs as evidenced by decreasing 9 hole peg test score for RUE by 10 secs on R and 1 or fewer drops. Baseline: R: 1:36 and L: 58 sec.  Pt reports dropping small items frequently Goal status: IN PROGRESS  4.  Pt will demonstrate improved ease with feeding as evidenced by decreasing PPT#2 (self feeding) by 5 secs  Baseline: 25.91 Goal status: IN PROGRESS  5.  Pt will demonstrate and/or report improved independence with LB dressing with use of AE/DME as needed.  Baseline:  Goal status: IN PROGRESS  ASSESSMENT:  CLINICAL IMPRESSION: Pt limited in open chest exercise with crunching in  shoulders with repetitive movement.  Pt receptive to recommendation of use of step stool, reporting that he has a couple at home that he may be able to utilize when dressing from toilet.  Pt demonstrating improved ease with reaching towards foot when utilizing step stool during simulated LB dressing.    PERFORMANCE DEFICITS: in functional skills including ADLs, IADLs, coordination, ROM, strength, flexibility, Fine motor control, Gross motor control, balance, body mechanics, endurance, decreased knowledge of precautions, decreased knowledge of use of DME, and UE functional use and psychosocial skills including coping strategies, environmental adaptation, and routines and behaviors.   IMPAIRMENTS: are limiting patient from ADLs, IADLs, and rest and sleep.     PLAN:  OT FREQUENCY: 1-2x/week  OT DURATION: 6 weeks  PLANNED INTERVENTIONS: 97168 OT Re-evaluation, 97535 self care/ADL training, 02889 therapeutic exercise, 97530 therapeutic activity, 97112 neuromuscular re-education, 97035 ultrasound, 97010 moist heat, 97010 cryotherapy, functional mobility training, psychosocial skills training, energy conservation, coping strategies training, patient/family education, and DME and/or AE instructions  RECOMMENDED OTHER SERVICES: NA  CONSULTED AND AGREED WITH  PLAN OF CARE: Patient  PLAN FOR NEXT SESSION: Review and add to large amplitude exercises, initiate bag exercises with focus on aspects of dressing, FMC tasks/HEP, Focus on sit > stand and standing balance.    Educate on modifications and DME/AE to aid in safety and independence with self-care tasks.   KAYLENE DOMINO, OTR/L 06/04/2023, 2:27 PM   [facility] at [facility] [address] [place], [address] [place], [place] [address] Phone # [phone] Fax # [phone]

## 2023-06-08 NOTE — Therapy (Signed)
OUTPATIENT PHYSICAL THERAPY NEURO TREATMENT Patient Name: Matthew [name] MRN: [id number] DOB:1950/11/19, 73 y.o., male Today's Date: 06/11/2023   PCP: Daisy Floro, MD REFERRING PROVIDER: Tat, Octaviano Batty, DO    END OF SESSION:  PT End of Session - 06/11/23 1146     Visit Number 16    Number of Visits 18    Date for PT Re-Evaluation 06/25/23    Authorization Type [employer]    Progress Note Due on Visit 20    PT Start Time 1059    PT Stop Time 1143    PT Time Calculation (min) 44 min    Equipment Utilized During Treatment Gait belt    Activity Tolerance Patient tolerated treatment well    Behavior During Therapy WFL for tasks assessed/performed                     Past Medical History:  Diagnosis Date   Anxiety    Arthritis    GERD (gastroesophageal reflux disease)    occ remote history   Hypertension    Parkinson disease (HCC)    Prostate cancer (HCC)    Right knee pain    questionable meniscal tear   Seasonal allergies    Past Surgical History:  Procedure Laterality Date   COLONOSCOPY     Leg Laceration Repair  Left    Age 52   PELVIC LYMPH NODE DISSECTION Bilateral 03/22/2018   Procedure: PELVIC LYMPH NODE DISSECTION;  Surgeon: Rene Paci, MD;  Location: [facility];  Service: Urology;  Laterality: Bilateral;   PROSTATE BIOPSY     ROBOT ASSISTED LAPAROSCOPIC RADICAL PROSTATECTOMY N/A 03/22/2018   Procedure: XI ROBOTIC ASSISTED LAPAROSCOPIC PROSTATECTOMY;  Surgeon: Rene Paci, MD;  Location: [facility];  Service: Urology;  Laterality: N/A;   TOTAL KNEE ARTHROPLASTY Right 05/05/2019   Procedure: RIGHT TOTAL KNEE ARTHROPLASTY;  Surgeon: Gean Birchwood, MD;  Location: [facility];  Service: Orthopedics;  Laterality: Right;   Patient Active Problem List   Diagnosis Date Noted   S/P TKR (total knee replacement), right 05/05/2019   Osteoarthritis of right knee 05/02/2019   Prostate cancer (HCC) 03/22/2018   Malignant neoplasm  of prostate (HCC) 02/27/2018    ONSET DATE: PD 2021  REFERRING DIAG: G20.A2 (ICD-10-CM) - Parkinson's disease without dyskinesia, with fluctuating manifestations (HCC)   THERAPY DIAG:  Muscle weakness (generalized)  Unsteadiness on feet  Abnormal posture  Other abnormalities of gait and mobility  Other symptoms and signs involving the nervous system  Rationale for Evaluation and Treatment: Rehabilitation  SUBJECTIVE:  SUBJECTIVE STATEMENT: Had some people over over the weekend.   Pt accompanied by: self  PERTINENT HISTORY: PD, R TKR, prostate CA, HTN  PAIN:  Are you having pain? Yes: NPRS scale: 0/10 Pain location: midback Pain description: dull Aggravating factors: certain movements/times Relieving factors: rest  PRECAUTIONS: Fall  RED FLAGS: None   WEIGHT BEARING RESTRICTIONS: No  FALLS: Has patient fallen in last 6 months? Yes. Number of falls "several"  LIVING ENVIRONMENT: Lives with: lives with their family Lives in: House/apartment Stairs: Yes, 2-3 steps to enter Has following equipment at home: Dan Humphreys - 4 wheeled and hospital bed  PLOF: Needs assistance with ADLs, Needs assistance with transfers, and bed mobility  PATIENT GOALS: improve balance, mobility, strength. "Get as normal as I can". Be able to step up into son's Jeep  OBJECTIVE:     TODAY'S TREATMENT: 06/11/23 Activity Comments  STS transfer from OT table CGA and verbal cueing to scoot/rotate LEs to the side of the chair to stand up as chair was scooted up to table   Nustep L5 x 6 min UEs/LEs  Cueing to maintain at least 80 SPM   STS + green medball 10x  Cues for set up  Sitting dips using armrest 2x5  Cueing for form; limited in full extension by UE weakness   STS + PWR step 10x  CGA-min A, cueing for  increased wt shift   Sitting triceps extensions with red TB 10x  Heavy cueing for proper form and maintaining amplitude     HOME EXERCISE PROGRAM Last updated: 06/11/23 Access Code: [email or web address] URL: [email or web address] Date: 06/11/2023 Prepared by: [facility] - Outpatient  Rehab - [facility]  Exercises - Supine Hamstring Stretch with Strap  - 1 x daily - 5 x weekly - 2 sets - 30 sec hold - Supine Piriformis Stretch with Foot on Ground  - 1 x daily - 5 x weekly - 2 sets - 30 sec hold - Supine Figure 4 Piriformis Stretch  - 1 x daily - 5 x weekly - 2 sets - 30 sec hold - Standing Shoulder Row with Anchored Resistance  - 1 x daily - 5 x weekly - 2 sets - 10 reps - Chair Press Ups Forward and Backward  - 1 x daily - 5 x weekly - 2 sets - 5 reps - Standing Tricep Extensions with Resistance  - 1 x daily - 5 x weekly - 2 sets - 10 reps   PATIENT EDUCATION: Education details: edu on how to address triceps strength for improving success with bed mobility and transfers, HEP update with edu for safety Person educated: Patient Education method: Explanation, Demonstration, Tactile cues, Verbal cues, and Handouts Education comprehension: verbalized understanding and returned demonstration      Note: Objective measures were completed at Evaluation unless otherwise noted.  DIAGNOSTIC FINDINGS: n/a for this episode  COGNITION: Overall cognitive status:  notes increased issues with memory   SENSATION: Not tested, denies parasthesias  COORDINATION: Deficits with rapid alternating movements Finger to nose intact    MUSCLE TONE: BLE rigidity  MUSCLE LENGTH: Hamstrings: Right -30 deg; Left -30 deg   DTRs:  NT  POSTURE: rounded shoulders, forward head, increased thoracic kyphosis, and flexed trunk   LOWER EXTREMITY ROM:     Active  Right Eval Left Eval  Hip flexion    Hip extension    Hip abduction    Hip adduction    Hip internal rotation    Hip external  rotation  Knee flexion    Knee extension -30 -30  Ankle dorsiflexion 10 10  Ankle plantarflexion    Ankle inversion    Ankle eversion     (Blank rows = not tested)  LOWER EXTREMITY MMT:    BLE 4/5 gross strength  BED MOBILITY:  Moderate assist  TRANSFERS: Assistive device utilized: Environmental consultant - 4 wheeled  Sit to stand: Complete Independence and Modified independence Stand to sit: Complete Independence Chair to chair: Complete Independence and Modified independence Floor:  NT    CURB:  Level of Assistance: CGA Assistive device utilized: Environmental consultant - 4 wheeled Curb Comments:   STAIRS: Reports stairs to 2nd floor but does not use 2-3 stairs to enter home  GAIT: Gait pattern: knee flexed in stance- Right and knee flexed in stance- Left Distance walked:  Assistive device utilized: Environmental consultant - 4 wheeled Level of assistance: Modified independence and SBA Comments:   FUNCTIONAL TESTS:  Mini-BESTest  11/28  Timed Up and Go test:26 sec with 4WW (compared to 15 sec)   10 meter walk test:15.31 sec = 2.14 ft/sec  (compared to 2.01 ft/sec)   5 time sit to stand test: 23 sec-(compared to 11 sec)    PATIENT EDUCATION: Education details: assessment details, rationale of intervention Person educated: Patient Education method: Explanation Education comprehension: verbalized understanding  HOME EXERCISE PROGRAM: TBD-reports daily performance 20 reps standing PWR! moves  GOALS: Goals reviewed with patient? Yes  SHORT TERM GOALS: Target date: 04/18/2023    Patient will be independent in HEP to improve functional outcomes Baseline: pt demo standing PWR! Moves + sit to stand Goal status: MET 04/17/2023  2.  Demo improved BLE strength and balance per time 15 sec 5xSTS test Baseline: 23>15.41 sec 04/17/2023 Goal status: MET 04/17/2023  3.  Demo improved safety with ambulation per time 15 sec TUG test Baseline: 34.53 sec>29.53 sec Goal status: IN PROGRESS    LONG TERM  GOALS: Target date: 05/16/2023    Improve hamstring flexibility to -15 degrees to reduce rigidity and enable greater knee extension ROM for mobility Baseline: -30 Goal status: IN PROGRESS  2.  Reduce risk for falls per score 20/28 Mini-BESTest Baseline: 11/20 Goal status: IN PROGRESS  3.  Demo modified independent bed mobility to reduce level of assist from caregivers  Baseline: moderate assist  Goal status: IN PROGRESS  ASSESSMENT:  CLINICAL IMPRESSION: Patient arrived to session without complaints. STS transfers with additional balance challenges. Patient requires cues for set up and up to min A when performing STS with PWR step d/t increased need for wt shift. Patient continues to show difficulty with scooting tasks, thus performed isolated triceps strengthening to assist with this and updated into HEP. No complaints at end of session.   OBJECTIVE IMPAIRMENTS: Abnormal gait, decreased activity tolerance, decreased balance, decreased coordination, decreased mobility, difficulty walking, decreased ROM, decreased strength, impaired flexibility, impaired tone, improper body mechanics, postural dysfunction, and pain.   ACTIVITY LIMITATIONS: carrying, lifting, bending, standing, squatting, stairs, transfers, bed mobility, reach over head, and locomotion level  PARTICIPATION LIMITATIONS: meal prep, cleaning, laundry, interpersonal relationship, shopping, community activity, yard work, and hobbies of music  PERSONAL FACTORS: Age, Past/current experiences, Time since onset of injury/illness/exacerbation, and 1-2 comorbidities: PMH  are also affecting patient's functional outcome.   REHAB POTENTIAL: Good  CLINICAL DECISION MAKING: Evolving/moderate complexity  EVALUATION COMPLEXITY: Moderate  PLAN:  PT FREQUENCY: 2x/week  PT DURATION: 8 weeks  PLANNED INTERVENTIONS: 97110-Therapeutic exercises, 97530- Therapeutic activity, [id number]- Neuromuscular re-education, 97535- Self Care, 72536-  Manual therapy, [id number]- Gait training, 60630- Orthotic Fit/training, 16010- Canalith repositioning, 93235- Aquatic Therapy, Patient/Family education, Balance training, Stair training, Taping, Dry Needling, Joint mobilization, Spinal mobilization, Vestibular training, DME instructions, Cryotherapy, and Moist heat  PLAN FOR NEXT SESSION: Continue to incorporate postural strengthening.  Seated leg strengthening, sit to stand from varied surfaces.  Review bed mobility techniques with wife present; review standing PWR moves HEP, supine power moves and stretching.  Work towards LTGS and make sure pt has an exercise plan for when PT discharges (emphasize [facility] Tuesday pm PWR! Moves as an option)    Baldemar Friday, PT, DPT 06/11/23 12:04 PM  [facility] at [facility] [address] [place], [address] [place], [place] [address] Phone # [phone] Fax # [phone]

## 2023-06-11 ENCOUNTER — Ambulatory Visit: Payer: PPO | Admitting: Physical Therapy

## 2023-06-11 ENCOUNTER — Ambulatory Visit: Payer: PPO | Admitting: Occupational Therapy

## 2023-06-11 ENCOUNTER — Encounter: Payer: Self-pay | Admitting: Physical Therapy

## 2023-06-11 DIAGNOSIS — M6281 Muscle weakness (generalized): Secondary | ICD-10-CM

## 2023-06-11 DIAGNOSIS — R29818 Other symptoms and signs involving the nervous system: Secondary | ICD-10-CM | POA: Diagnosis not present

## 2023-06-11 DIAGNOSIS — R293 Abnormal posture: Secondary | ICD-10-CM

## 2023-06-11 DIAGNOSIS — R278 Other lack of coordination: Secondary | ICD-10-CM

## 2023-06-11 DIAGNOSIS — R2681 Unsteadiness on feet: Secondary | ICD-10-CM

## 2023-06-11 DIAGNOSIS — R2689 Other abnormalities of gait and mobility: Secondary | ICD-10-CM

## 2023-06-11 NOTE — Therapy (Signed)
OUTPATIENT OCCUPATIONAL THERAPY [facility] Treatment Note  Patient Name: Matthew [name] MRN: [id number] DOB:Jan 11, 1951, 73 y.o., male Today's Date: 06/11/2023  PCP: Daisy Floro, MD REFERRING PROVIDER: Tat, Octaviano Batty, DO   END OF SESSION:  OT End of Session - 06/11/23 1207     Visit Number 13    Number of Visits 20    Date for OT Re-Evaluation 06/29/23    Authorization Type [employer]    OT Start Time 1018    OT Stop Time 1100    OT Time Calculation (min) 42 min    Behavior During Therapy WFL for tasks assessed/performed                         Past Medical History:  Diagnosis Date   Anxiety    Arthritis    GERD (gastroesophageal reflux disease)    occ remote history   Hypertension    Parkinson disease (HCC)    Prostate cancer (HCC)    Right knee pain    questionable meniscal tear   Seasonal allergies    Past Surgical History:  Procedure Laterality Date   COLONOSCOPY     Leg Laceration Repair  Left    Age 67   PELVIC LYMPH NODE DISSECTION Bilateral 03/22/2018   Procedure: PELVIC LYMPH NODE DISSECTION;  Surgeon: Rene Paci, MD;  Location: [facility];  Service: Urology;  Laterality: Bilateral;   PROSTATE BIOPSY     ROBOT ASSISTED LAPAROSCOPIC RADICAL PROSTATECTOMY N/A 03/22/2018   Procedure: XI ROBOTIC ASSISTED LAPAROSCOPIC PROSTATECTOMY;  Surgeon: Rene Paci, MD;  Location: [facility];  Service: Urology;  Laterality: N/A;   TOTAL KNEE ARTHROPLASTY Right 05/05/2019   Procedure: RIGHT TOTAL KNEE ARTHROPLASTY;  Surgeon: Gean Birchwood, MD;  Location: [facility];  Service: Orthopedics;  Laterality: Right;   Patient Active Problem List   Diagnosis Date Noted   S/P TKR (total knee replacement), right 05/05/2019   Osteoarthritis of right knee 05/02/2019   Prostate cancer (HCC) 03/22/2018   Malignant neoplasm of prostate (HCC) 02/27/2018    ONSET DATE: referral date 03/06/23  REFERRING DIAG: G20.A2 (ICD-10-CM) -  Parkinson's disease without dyskinesia, with fluctuating manifestations  THERAPY DIAG:  Muscle weakness (generalized)  Abnormal posture  Other lack of coordination  Other symptoms and signs involving the nervous system  Rationale for Evaluation and Treatment: Rehabilitation  SUBJECTIVE:   SUBJECTIVE STATEMENT: Pt reports trouble with ADL/IADLs such as eating, playing guitar, etc.   Pt accompanied by: self and significant other  PERTINENT HISTORY: PD, anxiety, arthritis, GERD, HTN, R TKR   PRECAUTIONS: Fall  WEIGHT BEARING RESTRICTIONS: No  PAIN:  Are you having pain? Yes: NPRS scale: 2/10 Pain location: middle of lower back Pain description: dull Aggravating factors: twisting Relieving factors: repositioning  FALLS: Has patient fallen in last 6 months? Yes. Number of falls Pt reports "having a time where he fell frequently" but has fallen ~3 times in the last "few months" with most recent being Saturday.  LIVING ENVIRONMENT: Lives with: lives with their spouse Lives in: House/apartment Stairs:  Yes: Internal: full flight of step, but able to stay on main floor without need to navigate steps; and External: 2 steps; on right going up Has following equipment at home: Single point cane, Quad cane small base, Walker - 2 wheeled, Environmental consultant - 4 wheeled, Wheelchair (manual), shower chair, Grab bars, and transport chair - for dr appts  PLOF: Requires assistive device for independence and Needs assistance with  ADLs  PATIENT GOALS: to be as close to normal as possible  OBJECTIVE:  Note: Objective measures were completed at Evaluation unless otherwise noted.  HAND DOMINANCE: Right  ADLs: Overall ADLs: reports overall slowing, wife is "second pair of hands" Transfers/ambulation related to ADLs: utilizing small 4 wheeled walker for all mobility  Eating: wife assisting with cutting foods, pt spilling foods off utensil, constantly spilling things off the plate, having to use finger  to stabilize food when scooping with R hand Grooming: utilizing electric toothbrush and water pick for oral care, difficulty with brushing hair  UB Dressing: primarily wearing t-shirts/pull over shirts, occasional assist from wife if in a hurry or get stuck, no longer wearing button up shirts anymore.  Wife will assist with donning shirt if completing after shower LB Dressing: difficulty with reaching forward towards feet when donning pants, socks, shoes; increased difficulty when crossing leg , no longer wearing lace up shoes Toileting: requires momentum to stand up from toilet or will reach towards door for leverage/momentum, reports "struggle" with hygiene  Bathing: occasional assist for thoroughness when drying off  Tub Shower transfers: wife provides supervision, handle is just inside door and pt able to hold on to it while stepping over small ledge Equipment: Grab bars, Walk in shower, and Long handled sponge  IADLs: Spouse completes all shopping, meal prep, and housekeeping tasks. Community mobility: Relies on family or friends for transportation Medication management: pt fills pill box and is able to take correct dosages at correct time Financial management: wife completes and did prior Handwriting:  TBA  MOBILITY STATUS: Hx of falls and difficulty carrying objections with ambulation  POSTURE COMMENTS:  forward head  ACTIVITY TOLERANCE: Activity tolerance: diminished  FUNCTIONAL OUTCOME MEASURES: Physical performance test: PPT#2 (simulated eating) 25.91 & PPT#4 (donning/doffing jacket): TBA  COORDINATION: 9 Hole Peg test: Right: 1:36.03 sec; Left: 58.28 sec  UE ROM:   B shoulder flexion ~120 *, reports mild discomfort in R upper arm with internal rotation but able to achieve full ROM  UE MMT:    grossly 4/5 bilaterally  MUSCLE TONE: RUE: Mild and LUE: Mild  COGNITION: Overall cognitive status:  slower processing and speech  OBSERVATIONS: Bradykinesia   TODAY'S  TREATMENT:                                                                                 DATE:  06/11/23 Pt engaged initially in towel raises with tension applied for overhead raises, torso twists/lateral all for 1x10 each with static hold and tactile cues. Added resistance push/pulls for additional weight bearing and engagement, 1x10 each direction.  Pt engaged in multiple [facility] activities for card flips, cutting card deck, in hand manipulation, etc all with cue for large exaggerated movements. Pt writing 4/4 familiar words/names with approx 60-70% legibility with cues for consistency, pacing, and size.    06/04/23 Large amplitude: engaged in bag exercises with focus on large amplitude, opening of shoulders and chest in preparation for simulated self-care tasks. LB dressing: utilized bag to simulate LB dressing.  Pt with difficulty reaching towards feet, therefore educated on propping foot on step stool to increase reach towards feet.  Pt demonstrating min-mod  improvements with advancing bag over foot, however still with min difficulty advancing bag from heel to toe when "doffing".  OT educated on use of reacher to aid in donning pants and/or pulling pants over heel.  OT providing demonstration of rolling waist band towards pant leg to increase ease with donning Depends.  Pt practicing aspects of above with step stool and bag, recommending pt attempt various aspects at home to assess if any increase independence and decrease burden of care.     05/31/23 Large amplitude: engaged in full arm extension with elbows and wrists extended and fingers open 10x2 with focus on large amplitude and anterior weight shift.  Engaged in wrist flexion/extension and forearm supination/pronation with cues for amplitude x10 each.  Engaged in large amplitude reaching out to R and L in sitting x10, utilized poles to facilitate increased reach x10 with good amplitude. Engaged in reaching across midline, utilizing poles to  facilitate increased reach for 2 sets of 10. Simulated dressing: utilizing Hula hoop in BUE to reach overhead to simulate UB dressing (with donning shirt overhead), completing 3 sets of 5.  Pt demonstrating improvements in sitting posture when reaching BUE over head, however then quickly returning to pelvic tilt when bringing arms back to midline.  Transitioned to simulating LB dressing while holding Hula hoop in BUE and then reaching towards feet and stepping over hula hoop with alternating LE, completing 2 sets of 5.  Pt required repositioning of UE to increase ease when reaching towards feet.     05/14/23 Large amplitude hands: engaged in full arm extension, with elbows and wrist extended and fingers open x10 followed by finger flicks with focus on large amplitude.  OT providing demonstration for increased sequencing and amplitude.   Self-feeding: 28.06 sec 9 hole peg test: R: 1:39.5 and L: 1:16.31 sec Self-care: engaged in lengthy discussion about bathroom setup and possibility of additional equipment to increase safety and independence with self-care tasks of bathing, dressing, and toileting.  Discussed current equipment in bathroom (use of shower chair for dressing/drying after shower).  OT educated on possibility of benefit from grab bars in toilet room/water closet.   PATIENT EDUCATION: Education details: condition specific education, large amplitude exercises Person educated: Patient Education method: Explanation Education comprehension: needs further education  HOME EXERCISE PROGRAM: Access Code: [email or web address] URL: [email or web address] Date: 04/10/2023 Prepared by: [facility] - Outpatient  Rehab - [facility]  Exercises - Seated Shoulder Shrugs  - 2 x daily - 5 reps - Seated Scapular Retraction  - 2 x daily - 5 reps - Seated Finger Flicks with Elbow Extension  - 1 x daily - 2 sets - 5-10 reps - Seated Reaching to Side and Across Body  - 1 x daily - 2 sets - 5-10  reps - Seated Reaching to Side  - 1 x daily - 2 sets - 5-10 reps  GOALS: Goals reviewed with patient? No  SHORT TERM GOALS: Target date: 06/08/23  Pt will be Independent with PD specific HEP. Baseline: Goal status: IN PROGRESS   LONG TERM GOALS: Target date: 06/29/23  Pt will demonstrate increased functional ROM and use of BUE to aid in clothing management and hygiene with toileting tasks.  Baseline:  Goal status: IN PROGRESS  2.  Pt will demonstrate improved ability to get up from lower seat/toilet without assistance with DME PRN. Baseline:  Goal status: IN PROGRESS  3.  Pt will demonstrate improved fine motor coordination for ADLs as evidenced by decreasing 9 hole peg  test score for RUE by 10 secs on R and 1 or fewer drops. Baseline: R: 1:36 and L: 58 sec.  Pt reports dropping small items frequently Goal status: IN PROGRESS  4.  Pt will demonstrate improved ease with feeding as evidenced by decreasing PPT#2 (self feeding) by 5 secs  Baseline: 25.91 Goal status: IN PROGRESS  5.  Pt will demonstrate and/or report improved independence with LB dressing with use of AE/DME as needed.  Baseline:  Goal status: IN PROGRESS  ASSESSMENT:  CLINICAL IMPRESSION: Note pt with "crunching" in shoulders and OT adjusting treatment as appropriate. Pt is an active participant in OT treatment and motivated, continues to demonstrate symptoms consistent with PD diagnosis including small movements, bradykinesia, and poor posture but is receptive to education and cues for compensation.  PERFORMANCE DEFICITS: in functional skills including ADLs, IADLs, coordination, ROM, strength, flexibility, Fine motor control, Gross motor control, balance, body mechanics, endurance, decreased knowledge of precautions, decreased knowledge of use of DME, and UE functional use and psychosocial skills including coping strategies, environmental adaptation, and routines and behaviors.   IMPAIRMENTS: are limiting patient  from ADLs, IADLs, and rest and sleep.     PLAN:  OT FREQUENCY: 1-2x/week  OT DURATION: 6 weeks  PLANNED INTERVENTIONS: 97168 OT Re-evaluation, 97535 self care/ADL training, 54098 therapeutic exercise, 97530 therapeutic activity, 97112 neuromuscular re-education, 97035 ultrasound, 97010 moist heat, 97010 cryotherapy, functional mobility training, psychosocial skills training, energy conservation, coping strategies training, patient/family education, and DME and/or AE instructions  RECOMMENDED OTHER SERVICES: NA  CONSULTED AND AGREED WITH PLAN OF CARE: Patient  PLAN FOR NEXT SESSION: Review and add to large amplitude exercises, initiate bag exercises with focus on aspects of dressing, FMC tasks/HEP, Focus on sit > stand and standing balance.    Educate on modifications and DME/AE to aid in safety and independence with self-care tasks.   Erasmo Score, OTR/L 06/11/2023, 12:09 PM   [facility] at [facility] [address] [place], [address] [place], [place] [address] Phone # [phone] Fax # [phone]

## 2023-06-12 ENCOUNTER — Ambulatory Visit: Payer: PPO

## 2023-06-12 DIAGNOSIS — R4701 Aphasia: Secondary | ICD-10-CM

## 2023-06-12 DIAGNOSIS — R471 Dysarthria and anarthria: Secondary | ICD-10-CM

## 2023-06-12 DIAGNOSIS — R41841 Cognitive communication deficit: Secondary | ICD-10-CM

## 2023-06-12 DIAGNOSIS — R29818 Other symptoms and signs involving the nervous system: Secondary | ICD-10-CM | POA: Diagnosis not present

## 2023-06-12 NOTE — Therapy (Unsigned)
OUTPATIENT SPEECH LANGUAGE PATHOLOGY PARKINSON'S TREATMENT/RECERTIFICATION   Patient Name: Matthew [name] MRN: [id number] DOB:Oct 26, 1950, 73 y.o., male Today's Date: 06/12/2023  PCP: Gildardo Cranker, MD REFERRING PROVIDER: Kerin Salen, DO  END OF SESSION:  End of Session - 06/12/23 1338     Visit Number 12    Number of Visits 17    Date for SLP Re-Evaluation 06/29/23    SLP Start Time 1324   checked in 1322   SLP Stop Time  1400    SLP Time Calculation (min) 36 min    Activity Tolerance Patient tolerated treatment well              Past Medical History:  Diagnosis Date   Anxiety    Arthritis    GERD (gastroesophageal reflux disease)    occ remote history   Hypertension    Parkinson disease (HCC)    Prostate cancer (HCC)    Right knee pain    questionable meniscal tear   Seasonal allergies    Past Surgical History:  Procedure Laterality Date   COLONOSCOPY     Leg Laceration Repair  Left    Age 54   PELVIC LYMPH NODE DISSECTION Bilateral 03/22/2018   Procedure: PELVIC LYMPH NODE DISSECTION;  Surgeon: Rene Paci, MD;  Location: [facility];  Service: Urology;  Laterality: Bilateral;   PROSTATE BIOPSY     ROBOT ASSISTED LAPAROSCOPIC RADICAL PROSTATECTOMY N/A 03/22/2018   Procedure: XI ROBOTIC ASSISTED LAPAROSCOPIC PROSTATECTOMY;  Surgeon: Rene Paci, MD;  Location: [facility];  Service: Urology;  Laterality: N/A;   TOTAL KNEE ARTHROPLASTY Right 05/05/2019   Procedure: RIGHT TOTAL KNEE ARTHROPLASTY;  Surgeon: Gean Birchwood, MD;  Location: [facility];  Service: Orthopedics;  Laterality: Right;   Patient Active Problem List   Diagnosis Date Noted   S/P TKR (total knee replacement), right 05/05/2019   Osteoarthritis of right knee 05/02/2019   Prostate cancer (HCC) 03/22/2018   Malignant neoplasm of prostate (HCC) 02/27/2018   Speech Therapy Progress Note  Dates of Reporting Period: 03/21/23 to present  Subjective Statement: Pt has been seen for 11  ST sessions focusing on speech loudness.  Objective: See below.  Goal Update: See below.  Plan: See pt for no more than 6 more sessions.  Reason Skilled Services are Required: Pt has not maximized rehab potential to date.   ONSET DATE: script dated 03-06-23  REFERRING DIAG: Parkinson's Disease  THERAPY DIAG:  Dysarthria and anarthria  Cognitive communication deficit  Aphasia  Rationale for Evaluation and Treatment: Rehabilitation  SUBJECTIVE:   SUBJECTIVE STATEMENT: "Bout the same (pt re: how speech is going at home)."  Pt accompanied by: self  PERTINENT HISTORY: Pt well-known to this SLP from previous courses of ST. MBS in August 2023- results below. Pt's last ST course resulted in pt partially meeting goal for producing speech in upper 60s dB in simple to mod complex with occasional min A.  PAIN:  Are you having pain? No  FALLS: Has patient fallen in last 6 months?  See PT evaluation for details  PATIENT GOALS: Improve loudness and fluency  OBJECTIVE:   DIAGNOSTIC FINDINGS:  MBS 12-22-21 Patient presents with functional oropharyngeal swallowing without aspiration of any consistency tested. He does demonstrate minimal asymptomatic laryngeal penetration of thin liquid due to decreased timing of laryngeal closure. Chin tuck posture with use of straw effective to prevent penetration. Pharyngeal swallow is strong without retention. Oral transiting discoordination noted with pt swallowing barium tablet as he required 2nd bolus  of thin to transit through oropharynx. Upon esophageal sweep, pt appeared with barium tablet retained in esophagus with pt awareness. Tablet appeared to transit distally after several boluses of liquids. After tablet had transited, pt continued with "phantom" sensation to esophagus. Recommend pt continue diet as tolerated, using chin tuck with liquids if improves comfort, decreases cough. Also encouraged pt to assure maintains strength of voice, cough and  expectoration for airway protection.    PATIENT REPORTED OUTCOME MEASURES (PROM): Communication Effectiveness Survey: pt returned CES on 04/12/23 with score of 18/40, with lower scores indicating more impact of pt's deficits on daily communication situations.   TODAY'S TREATMENT:                                                                                                                                         DATE:  06/12/23: "I have good days with my speech and then bad days."  [name] entered with suboptimal volume (approx 67dB). SLP used loud /a/ to habituate incr'd loudness and effort in speech production in order to improve pt's speech volume in conversation. Average today was   05/31/23: Pt average with loud /a/ 84 dB. Pt completed everyday sentences with average 81dB. In short conversational segments of 2-5 minutes pt req'd occasional min-mod A for loudness. SLP moved downward in hierarchy to 60-90 second segments and pt req'd occasional min A for WNL loudness. Pt needs more appointments due to missed visits.  05/14/23: Pt conversation mid 60s in 5 minutes conversation. "I just can't get loud - this thing really did me in," pt stated. SLP educated pt on how illness can affect people re: deconditioning and this may be worse for people with PD than people who do not have PD. Pt produced loud /a/ with mid 70s dB initially and with initial min cues from SLP improved this to average 82 dB. In short (1 minute) conversational segments/monologues, pt req'd occasional min A. This is not as successful as last 1-2 sessions.SLP encouraged pt to get back to practicing /a/ ASAP and he should improve average dB and duration within a few days. Pt agreed.   05/03/23: SLP and pt engaged in conversation for ~5 minute conversational segments x5 today, pt maintained average of 68dB with occasional min A for loudness. Pt endorses his loudness is better at home in the last week.   04/26/23: Pt talked with SLP in  simple conversation for 15 minutes with rare min A for loudness. Loud /a/ targeted today to make louder speech more habitual with average loudness 83dB, with rare min A for loudness. Everyday sentences read with average loudness 80dB with rare min-mod cues for loudness. Conversation afterwards (simple to mod complex) of 15 minutes averaged 67dB with occasional min A for loudness/effort.  04/24/23: Pt taking his PD meds just prior to ST. Pt states he performed loud /a/ and sentences 5/7 days. Loud /a/ targeted today  to make louder speech more habitual. Average loudness 85dB, with rare incr'd to occasional min A for loudness. Everyday sentences read with average loudness 80dB with occasional min-mod cues for loudness. Pt req'd usual min-mod cues for loudness in 3-4 minute conversational segments. SLP encouraged pt to cont to practice consistently.   04/17/23: Pt states family is asking him less frequently for repeats. Today in short conversation segments of 3 minutes, pt maintained upper 60s dB average for 4 segments and then req'd incr'ing level of cueing to occasional min-mod A by 7th segment. SLP encouraged pt to cont to practice at home.   04/12/23: Loud /a/ targeted today to make louder speech more habitual. Average loudness 88dB, with usual min A for loudness. Everyday sentences read with average loudness 82dB with usual mod cues for loudness and tactile cues of abdominal recruitment. In sentence responses pt req'd min-mod A for loudness, occasionally. Pt appeared fatigued from previous therapies today and told SLP affirmatively he was fatigued when SLP asked him. SLP provided homework for sentence responses. CES returned today - results above.   04/10/23: Loud /a/ targeted today to make louder speech more habitual. Average loudness 85dB. Everyday sentences read with average loudness 83dB. SLP engaged in conversational topics (3-4 minutes) x5; SLP req'd to provide min-mod cues, rarely - which increased to  mod cues occasionally by last topic. Pt acknowledged he was fatigued from PT where they worked on bed mobility. Pt provided 2 sentence responses with average 65dB with rare min-mod A for loudness. Other multiple sentence responses were challenging today due to decr'd divided attention negatively impacting pt performance today more than previous sessions, likely due to fatigue factor. SLP encouraged pt to cont to practice at home. "I always practice with my grandkids because I don't want them to have to ask me to repeat like [name] does."  04/03/23: SLP engaged pt with loud /a/ to encourage muscle memory to make louder speech more habitual. Average loudness 85dB. Initial cues for loudness but pt independent after that. Everyday sentences were read with average 81dB.  Word level responses with higher cognitive load completed with average 70dB.  03/27/23: Pt arrived and greeted SLP with 70dB average, and then decr'd to a soft volume once he sat down in ST room (average 64dB). Initial cues for louder speech and tactile cue for feeling strength in his voice from abdomen, thinking about effort for /a/.  SLP engaged pt in conversation for 38 minutes, with conversational segments of 8-13 minutes, x4. SLP req'd occasional mod cues for pt to improve loudness, faded to occasional min-mod cues. Pt demonstrated decr'd stamina approx 32 minutes into conversational segments. SLP reiterated the necessity to perform loud /a/ at home as prescribed when pt went to lobby to meet wife.   03/21/23 (eval): SPEECH: SLP explained eval results to pt. SLP ensured pt knew the importance of daily loud /a/ and everyday sentences practice BID.  SWALLOWING: Additionally, he and SLP practiced chin down posture with a straw and with a cup.  PATIENT EDUCATION: Education details: see "today's treatment" Person educated: Patient Education method: Explanation and Demonstration Education comprehension: verbalized understanding  HOME  EXERCISE PROGRAM: Loud /a/ and everyday sentences.    GOALS: Goals reviewed with patient? No  SHORT TERM GOALS: Target date: 04/20/23  Produce /a/ with average mid-upper 80s dB over three sessions Baseline: 04/12/23 Goal status: Partially met  2.  pt will demo abdominal breathing with sentence responses 85% accuracy x 3 sessions  Baseline:  Goal  status: not addressed yet  3.  pt will generate 2 minutes simple conversation with upper 60s dB average in 2 sessions  Baseline: 04/17/23 Goal status: Partially met  4.  Pt will use strategies for dysfluency in 1-2 minute short conversational segments  Baseline:  Goal status: deferred - dysfluency not heard in sessions thus far   LONG TERM GOALS: Target date: 05/20/23, 06/29/23  pt will produce average upper 60s dB in 10 minutes simple to mod complex conversation in 5 sessions  Baseline:  Goal status: Not met, and continue  2.  pt will maintain upper 60s dB average in 10 minute simple conversation x 3 sessions  Baseline: 04/26/23 Goal status: Partially met, and continue  3.  pt will use abdominal breathing >80% of the time during 10 minute simple conversation in 3 sessions  Baseline: 04/26/23 Goal status: Partially met, and continue  4.  pt will report fewer requests to repeat himself than prior to ST in 3 sessions  Baseline:  Goal status: Not met, and continue  5.  Pt will use strategies for safer swallowing in 3 sessions with modified independence  Baseline:  Goal status: Not met, and continue  6.  Pt will score higher/better on PROM in the last 2 weeks of therapy Baseline:  Goal status: deferred - will perform on last visit  ASSESSMENT:  CLINICAL IMPRESSION: RECERT TODAY. Patient is a 73 y.o. male who was seen today for treatment of dysarthria in light of Parkinsons Disease. See "today's treatment" for more details, and "Long Term Goals" for more information. He has not been seen for the total number of visits due to  scheduling and attendance issues (pt ill, SLP schedule). Pt told SLP on eval date that family members ask him to repeat frequently, and he frequency of communicating in social situations has decr'd since last session of previous therapy course. Additionally when pt used chin tuck posture with liquids as told on MBS in August 2023, he felt safer.  OBJECTIVE IMPAIRMENTS: Objective impairments include executive functioning, aphasia, dysarthria, and dysphagia. These impairments are limiting patient from household responsibilities, ADLs/IADLs, effectively communicating at home and in community, and safety when swallowing.Factors affecting potential to achieve goals and functional outcome are  none .Marland Kitchen Patient will benefit from skilled SLP services to address above impairments and improve overall function.  REHAB POTENTIAL: Good  PLAN:  SLP FREQUENCY: 1-2x/week  SLP DURATION:  6 sessions  PLANNED INTERVENTIONS: Aspiration precaution training, Diet toleration management , Environmental controls, Cueing hierachy, Cognitive reorganization, Internal/external aids, Oral motor exercises, Functional tasks, Multimodal communication approach, SLP instruction and feedback, Compensatory strategies, and Patient/family education    [facility], CCC-SLP 06/12/2023, 1:40 PM

## 2023-06-14 ENCOUNTER — Ambulatory Visit: Payer: PPO

## 2023-06-14 DIAGNOSIS — R41841 Cognitive communication deficit: Secondary | ICD-10-CM

## 2023-06-14 DIAGNOSIS — R4701 Aphasia: Secondary | ICD-10-CM

## 2023-06-14 DIAGNOSIS — R471 Dysarthria and anarthria: Secondary | ICD-10-CM

## 2023-06-14 DIAGNOSIS — R29818 Other symptoms and signs involving the nervous system: Secondary | ICD-10-CM | POA: Diagnosis not present

## 2023-06-14 NOTE — Therapy (Signed)
OUTPATIENT SPEECH LANGUAGE PATHOLOGY PARKINSON'S TREATMENT   Patient Name: Matthew [name] MRN: [id number] DOB:05-Nov-1950, 73 y.o., male Today's Date: 06/14/2023  PCP: Gildardo Cranker, MD REFERRING PROVIDER: Kerin Salen, DO  END OF SESSION:  End of Session - 06/14/23 1617     Visit Number 13    Number of Visits 17    Date for SLP Re-Evaluation 06/29/23    SLP Start Time 1450    SLP Stop Time  1532    SLP Time Calculation (min) 42 min    Activity Tolerance Patient tolerated treatment well              Past Medical History:  Diagnosis Date   Anxiety    Arthritis    GERD (gastroesophageal reflux disease)    occ remote history   Hypertension    Parkinson disease (HCC)    Prostate cancer (HCC)    Right knee pain    questionable meniscal tear   Seasonal allergies    Past Surgical History:  Procedure Laterality Date   COLONOSCOPY     Leg Laceration Repair  Left    Age 73   PELVIC LYMPH NODE DISSECTION Bilateral 03/22/2018   Procedure: PELVIC LYMPH NODE DISSECTION;  Surgeon: Rene Paci, MD;  Location: [facility];  Service: Urology;  Laterality: Bilateral;   PROSTATE BIOPSY     ROBOT ASSISTED LAPAROSCOPIC RADICAL PROSTATECTOMY N/A 03/22/2018   Procedure: XI ROBOTIC ASSISTED LAPAROSCOPIC PROSTATECTOMY;  Surgeon: Rene Paci, MD;  Location: [facility];  Service: Urology;  Laterality: N/A;   TOTAL KNEE ARTHROPLASTY Right 05/05/2019   Procedure: RIGHT TOTAL KNEE ARTHROPLASTY;  Surgeon: Gean Birchwood, MD;  Location: [facility];  Service: Orthopedics;  Laterality: Right;   Patient Active Problem List   Diagnosis Date Noted   S/P TKR (total knee replacement), right 05/05/2019   Osteoarthritis of right knee 05/02/2019   Prostate cancer (HCC) 03/22/2018   Malignant neoplasm of prostate (HCC) 02/27/2018     ONSET DATE: script dated 03-06-23  REFERRING DIAG: Parkinson's Disease  THERAPY DIAG:  Dysarthria and anarthria  Cognitive communication  deficit  Aphasia  Rationale for Evaluation and Treatment: Rehabilitation  SUBJECTIVE:   SUBJECTIVE STATEMENT: "Bout the same (pt re: how speech is going at home)."  Pt accompanied by: self  PERTINENT HISTORY: Pt well-known to this SLP from previous courses of ST. MBS in August 2023- results below. Pt's last ST course resulted in pt partially meeting goal for producing speech in upper 60s dB in simple to mod complex with occasional min A.  PAIN:  Are you having pain? No  FALLS: Has patient fallen in last 6 months?  See PT evaluation for details  PATIENT GOALS: Improve loudness and fluency  OBJECTIVE:   DIAGNOSTIC FINDINGS:  MBS 12-22-21 Patient presents with functional oropharyngeal swallowing without aspiration of any consistency tested. He does demonstrate minimal asymptomatic laryngeal penetration of thin liquid due to decreased timing of laryngeal closure. Chin tuck posture with use of straw effective to prevent penetration. Pharyngeal swallow is strong without retention. Oral transiting discoordination noted with pt swallowing barium tablet as he required 2nd bolus of thin to transit through oropharynx. Upon esophageal sweep, pt appeared with barium tablet retained in esophagus with pt awareness. Tablet appeared to transit distally after several boluses of liquids. After tablet had transited, pt continued with "phantom" sensation to esophagus. Recommend pt continue diet as tolerated, using chin tuck with liquids if improves comfort, decreases cough. Also encouraged pt to assure maintains strength of  voice, cough and expectoration for airway protection.    PATIENT REPORTED OUTCOME MEASURES (PROM): Communication Effectiveness Survey: pt returned CES on 04/12/23 with score of 18/40, with lower scores indicating more impact of pt's deficits on daily communication situations.   TODAY'S TREATMENT:                                                                                                                                          DATE:  06/14/23: Pt needs chin tuck with liquids targeted next session. Pt entered today with near-WNL loudness (69dB), slowly reduced to average 67dB after 10 minutes. After this, occasional min A necessary to maintain 67dB average. Pt told SLP it feels like he's shouting and SLP educated/re-educated pt that is due to his speech production system working at suboptimal levels and this has become his habit. When his system produces WNL speech volume it is working more than it usually does so he feels like he is shouting. SLP used digital recorder to demonstrate this for pt. Another 10 minute conversation averaged 66dB over 10 minutes. Suspect this was due to pt mental fatigue. "When I have to think about what I want to say I get softer," pt stated.   06/12/23: "I have good days with my speech and then bad days."  [name] entered with suboptimal volume (approx 67dB). SLP used loud /a/ to habituate incr'd loudness and effort in speech production in order to improve pt's speech volume in conversation. Average today was 82 dB. Pt today appears more lethargic than in previous sessions. Conversational segments of 3-4 minutes req'd SLP to provide occasional min-mod A for loudness.   05/31/23: Pt average with loud /a/ 84 dB. Pt completed everyday sentences with average 81dB. In short conversational segments of 2-5 minutes pt req'd occasional min-mod A for loudness. SLP moved downward in hierarchy to 60-90 second segments and pt req'd occasional min A for WNL loudness. Pt needs more appointments due to missed visits.  05/14/23: Pt conversation mid 60s in 5 minutes conversation. "I just can't get loud - this thing really did me in," pt stated. SLP educated pt on how illness can affect people re: deconditioning and this may be worse for people with PD than people who do not have PD. Pt produced loud /a/ with mid 70s dB initially and with initial min cues from SLP improved this to  average 82 dB. In short (1 minute) conversational segments/monologues, pt req'd occasional min A. This is not as successful as last 1-2 sessions.SLP encouraged pt to get back to practicing /a/ ASAP and he should improve average dB and duration within a few days. Pt agreed.   05/03/23: SLP and pt engaged in conversation for ~5 minute conversational segments x5 today, pt maintained average of 68dB with occasional min A for loudness. Pt endorses his loudness is better at home in the last week.   04/26/23:  Pt talked with SLP in simple conversation for 15 minutes with rare min A for loudness. Loud /a/ targeted today to make louder speech more habitual with average loudness 83dB, with rare min A for loudness. Everyday sentences read with average loudness 80dB with rare min-mod cues for loudness. Conversation afterwards (simple to mod complex) of 15 minutes averaged 67dB with occasional min A for loudness/effort.  04/24/23: Pt taking his PD meds just prior to ST. Pt states he performed loud /a/ and sentences 5/7 days. Loud /a/ targeted today to make louder speech more habitual. Average loudness 85dB, with rare incr'd to occasional min A for loudness. Everyday sentences read with average loudness 80dB with occasional min-mod cues for loudness. Pt req'd usual min-mod cues for loudness in 3-4 minute conversational segments. SLP encouraged pt to cont to practice consistently.   04/17/23: Pt states family is asking him less frequently for repeats. Today in short conversation segments of 3 minutes, pt maintained upper 60s dB average for 4 segments and then req'd incr'ing level of cueing to occasional min-mod A by 7th segment. SLP encouraged pt to cont to practice at home.   04/12/23: Loud /a/ targeted today to make louder speech more habitual. Average loudness 88dB, with usual min A for loudness. Everyday sentences read with average loudness 82dB with usual mod cues for loudness and tactile cues of abdominal recruitment.  In sentence responses pt req'd min-mod A for loudness, occasionally. Pt appeared fatigued from previous therapies today and told SLP affirmatively he was fatigued when SLP asked him. SLP provided homework for sentence responses. CES returned today - results above.   04/10/23: Loud /a/ targeted today to make louder speech more habitual. Average loudness 85dB. Everyday sentences read with average loudness 83dB. SLP engaged in conversational topics (3-4 minutes) x5; SLP req'd to provide min-mod cues, rarely - which increased to mod cues occasionally by last topic. Pt acknowledged he was fatigued from PT where they worked on bed mobility. Pt provided 2 sentence responses with average 65dB with rare min-mod A for loudness. Other multiple sentence responses were challenging today due to decr'd divided attention negatively impacting pt performance today more than previous sessions, likely due to fatigue factor. SLP encouraged pt to cont to practice at home. "I always practice with my grandkids because I don't want them to have to ask me to repeat like [name] does."  04/03/23: SLP engaged pt with loud /a/ to encourage muscle memory to make louder speech more habitual. Average loudness 85dB. Initial cues for loudness but pt independent after that. Everyday sentences were read with average 81dB.  Word level responses with higher cognitive load completed with average 70dB.  03/27/23: Pt arrived and greeted SLP with 70dB average, and then decr'd to a soft volume once he sat down in ST room (average 64dB). Initial cues for louder speech and tactile cue for feeling strength in his voice from abdomen, thinking about effort for /a/.  SLP engaged pt in conversation for 38 minutes, with conversational segments of 8-13 minutes, x4. SLP req'd occasional mod cues for pt to improve loudness, faded to occasional min-mod cues. Pt demonstrated decr'd stamina approx 32 minutes into conversational segments. SLP reiterated the necessity  to perform loud /a/ at home as prescribed when pt went to lobby to meet wife.   03/21/23 (eval): SPEECH: SLP explained eval results to pt. SLP ensured pt knew the importance of daily loud /a/ and everyday sentences practice BID.  SWALLOWING: Additionally, he and SLP practiced chin  down posture with a straw and with a cup.  PATIENT EDUCATION: Education details: see "today's treatment" Person educated: Patient Education method: Explanation and Demonstration Education comprehension: verbalized understanding  HOME EXERCISE PROGRAM: Loud /a/ and everyday sentences.    GOALS: Goals reviewed with patient? No  SHORT TERM GOALS: Target date: 04/20/23  Produce /a/ with average mid-upper 80s dB over three sessions Baseline: 04/12/23 Goal status: Partially met  2.  pt will demo abdominal breathing with sentence responses 85% accuracy x 3 sessions  Baseline:  Goal status: not addressed yet  3.  pt will generate 2 minutes simple conversation with upper 60s dB average in 2 sessions  Baseline: 04/17/23 Goal status: Partially met  4.  Pt will use strategies for dysfluency in 1-2 minute short conversational segments  Baseline:  Goal status: deferred - dysfluency not heard in sessions thus far   LONG TERM GOALS: Target date: 05/20/23, 06/29/23  pt will produce average upper 60s dB in 10 minutes simple to mod complex conversation in 5 sessions  Baseline:  Goal status: continue  2.  pt will maintain upper 60s dB average in 10 minute simple conversation x 3 sessions  Baseline: 04/26/23, 06/14/23 Goal status: continue  3.  pt will use abdominal breathing >80% of the time during 10 minute simple conversation in 3 sessions  Baseline: 04/26/23, 06/14/23 Goal status: continue  4.  pt will report fewer requests to repeat himself than prior to ST in 3 sessions  Baseline:  Goal status: continue  5.  Pt will use strategies for safer swallowing in 3 sessions with modified independence  Baseline:   Goal status: continue  6.  Pt will score higher/better on PROM in the last 2 weeks of therapy Baseline:  Goal status: deferred - will perform on last visit  ASSESSMENT:  CLINICAL IMPRESSION: Patient is a 73 y.o. male who was seen today for treatment of dysarthria in light of Parkinsons Disease. See "today's treatment" for more details. Pt told SLP on eval date that family members ask him to repeat frequently, and he frequency of communicating in social situations has decr'd since last session of previous therapy course. Additionally when pt used chin tuck posture with liquids as told on MBS in August 2023, he felt safer.  OBJECTIVE IMPAIRMENTS: Objective impairments include executive functioning, aphasia, dysarthria, and dysphagia. These impairments are limiting patient from household responsibilities, ADLs/IADLs, effectively communicating at home and in community, and safety when swallowing.Factors affecting potential to achieve goals and functional outcome are  none .Marland Kitchen Patient will benefit from skilled SLP services to address above impairments and improve overall function.  REHAB POTENTIAL: Good  PLAN:  SLP FREQUENCY: 1-2x/week  SLP DURATION:  6 sessions  PLANNED INTERVENTIONS: Aspiration precaution training, Diet toleration management , Environmental controls, Cueing hierachy, Cognitive reorganization, Internal/external aids, Oral motor exercises, Functional tasks, Multimodal communication approach, SLP instruction and feedback, Compensatory strategies, and Patient/family education    [facility], CCC-SLP 06/14/2023, 4:17 PM

## 2023-06-19 ENCOUNTER — Encounter: Payer: Self-pay | Admitting: Physical Therapy

## 2023-06-19 ENCOUNTER — Ambulatory Visit: Payer: PPO | Admitting: Occupational Therapy

## 2023-06-19 ENCOUNTER — Ambulatory Visit: Payer: PPO

## 2023-06-19 ENCOUNTER — Ambulatory Visit: Payer: PPO | Admitting: Physical Therapy

## 2023-06-19 DIAGNOSIS — M6281 Muscle weakness (generalized): Secondary | ICD-10-CM

## 2023-06-19 DIAGNOSIS — R293 Abnormal posture: Secondary | ICD-10-CM

## 2023-06-19 DIAGNOSIS — R2681 Unsteadiness on feet: Secondary | ICD-10-CM

## 2023-06-19 DIAGNOSIS — R471 Dysarthria and anarthria: Secondary | ICD-10-CM

## 2023-06-19 DIAGNOSIS — R278 Other lack of coordination: Secondary | ICD-10-CM

## 2023-06-19 DIAGNOSIS — R29818 Other symptoms and signs involving the nervous system: Secondary | ICD-10-CM

## 2023-06-19 DIAGNOSIS — R4701 Aphasia: Secondary | ICD-10-CM

## 2023-06-19 DIAGNOSIS — R41841 Cognitive communication deficit: Secondary | ICD-10-CM

## 2023-06-19 NOTE — Therapy (Signed)
OUTPATIENT SPEECH LANGUAGE PATHOLOGY PARKINSON'S TREATMENT   Patient Name: Matthew [name] MRN: [id number] DOB:[birth year], 73 y.o., male Today's Date: 06/19/2023  PCP: Gildardo Cranker, MD REFERRING PROVIDER: Kerin Salen, DO  END OF SESSION:  End of Session - 06/19/23 1215     Visit Number 14    Number of Visits 17    Date for SLP Re-Evaluation 06/29/23    SLP Start Time 1151    SLP Stop Time  1231    SLP Time Calculation (min) 40 min    Activity Tolerance Patient tolerated treatment well               Past Medical History:  Diagnosis Date   Anxiety    Arthritis    GERD (gastroesophageal reflux disease)    occ remote history   Hypertension    Parkinson disease (HCC)    Prostate cancer (HCC)    Right knee pain    questionable meniscal tear   Seasonal allergies    Past Surgical History:  Procedure Laterality Date   COLONOSCOPY     Leg Laceration Repair  Left    Age 4   PELVIC LYMPH NODE DISSECTION Bilateral 03/22/2018   Procedure: PELVIC LYMPH NODE DISSECTION;  Surgeon: Rene Paci, MD;  Location: [facility];  Service: Urology;  Laterality: Bilateral;   PROSTATE BIOPSY     ROBOT ASSISTED LAPAROSCOPIC RADICAL PROSTATECTOMY N/A 03/22/2018   Procedure: XI ROBOTIC ASSISTED LAPAROSCOPIC PROSTATECTOMY;  Surgeon: Rene Paci, MD;  Location: [facility];  Service: Urology;  Laterality: N/A;   TOTAL KNEE ARTHROPLASTY Right 05/05/2019   Procedure: RIGHT TOTAL KNEE ARTHROPLASTY;  Surgeon: Gean Birchwood, MD;  Location: [facility];  Service: Orthopedics;  Laterality: Right;   Patient Active Problem List   Diagnosis Date Noted   S/P TKR (total knee replacement), right 05/05/2019   Osteoarthritis of right knee 05/02/2019   Prostate cancer (HCC) 03/22/2018   Malignant neoplasm of prostate (HCC) 02/27/2018     ONSET DATE: script dated 03-06-23  REFERRING DIAG: Parkinson's Disease  THERAPY DIAG:  Dysarthria and anarthria  Cognitive communication  deficit  Aphasia  Rationale for Evaluation and Treatment: Rehabilitation  SUBJECTIVE:   SUBJECTIVE STATEMENT: Pt arrives today with volume in mid-upper 60s dB.  Pt accompanied by: self  PERTINENT HISTORY: Pt well-known to this SLP from previous courses of ST. MBS in August 2023- results below. Pt's last ST course resulted in pt partially meeting goal for producing speech in upper 60s dB in simple to mod complex with occasional min A.  PAIN:  Are you having pain? No  FALLS: Has patient fallen in last 6 months?  See PT evaluation for details  PATIENT GOALS: Improve loudness and fluency  OBJECTIVE:   DIAGNOSTIC FINDINGS:  MBS 12-22-21 Patient presents with functional oropharyngeal swallowing without aspiration of any consistency tested. He does demonstrate minimal asymptomatic laryngeal penetration of thin liquid due to decreased timing of laryngeal closure. Chin tuck posture with use of straw effective to prevent penetration. Pharyngeal swallow is strong without retention. Oral transiting discoordination noted with pt swallowing barium tablet as he required 2nd bolus of thin to transit through oropharynx. Upon esophageal sweep, pt appeared with barium tablet retained in esophagus with pt awareness. Tablet appeared to transit distally after several boluses of liquids. After tablet had transited, pt continued with "phantom" sensation to esophagus. Recommend pt continue diet as tolerated, using chin tuck with liquids if improves comfort, decreases cough. Also encouraged pt to assure maintains strength of voice,  cough and expectoration for airway protection.    PATIENT REPORTED OUTCOME MEASURES (PROM): Communication Effectiveness Survey: pt returned CES on 04/12/23 with score of 18/40, with lower scores indicating more impact of pt's deficits on daily communication situations.   TODAY'S TREATMENT:                                                                                                                                          DATE:  06/19/23: SPEECH: SLP used loud /a/ to habituate incr'd loudness and effort in speech production in order to improve pt's speech volume in conversation. Average today was 86 dB. Everyday sentences average was 80dB. In two 10 minute segments of conversation pt averaged 69dB with rare min A for loudness.   SWALLOWING: SLP assessed pt's use of chin tuck with liquids - pt brought water with him today. SLP initially cued pt for chin tuck, reminding him of the recommendation from [facility] and pt recalled this. With 6 sips he placed chin down with initial cue but was independent by end of this portion of session. He said he would cont to use this at home as he stated it gave him more time to swallow.   06/14/23: Pt needs chin tuck with liquids targeted next session. Pt entered today with near-WNL loudness (69dB), slowly reduced to average 67dB after 10 minutes. After this, occasional min A necessary to maintain 67dB average. Pt told SLP it feels like he's shouting and SLP educated/re-educated pt that is due to his speech production system working at suboptimal levels and this has become his habit. When his system produces WNL speech volume it is working more than it usually does so he feels like he is shouting. SLP used digital recorder to demonstrate this for pt. Another 10 minute conversation averaged 66dB over 10 minutes. Suspect this was due to pt mental fatigue. "When I have to think about what I want to say I get softer," pt stated.   06/12/23: "I have good days with my speech and then bad days."  [name] entered with suboptimal volume (approx 67dB). SLP used loud /a/ to habituate incr'd loudness and effort in speech production in order to improve pt's speech volume in conversation. Average today was 82 dB. Pt today appears more lethargic than in previous sessions. Conversational segments of 3-4 minutes req'd SLP to provide occasional min-mod A for loudness.   05/31/23: Pt  average with loud /a/ 84 dB. Pt completed everyday sentences with average 81dB. In short conversational segments of 2-5 minutes pt req'd occasional min-mod A for loudness. SLP moved downward in hierarchy to 60-90 second segments and pt req'd occasional min A for WNL loudness. Pt needs more appointments due to missed visits.  05/14/23: Pt conversation mid 60s in 5 minutes conversation. "I just can't get loud - this thing really did me in," pt stated. SLP educated pt on how illness  can affect people re: deconditioning and this may be worse for people with PD than people who do not have PD. Pt produced loud /a/ with mid 70s dB initially and with initial min cues from SLP improved this to average 82 dB. In short (1 minute) conversational segments/monologues, pt req'd occasional min A. This is not as successful as last 1-2 sessions.SLP encouraged pt to get back to practicing /a/ ASAP and he should improve average dB and duration within a few days. Pt agreed.   05/03/23: SLP and pt engaged in conversation for ~5 minute conversational segments x5 today, pt maintained average of 68dB with occasional min A for loudness. Pt endorses his loudness is better at home in the last week.   04/26/23: Pt talked with SLP in simple conversation for 15 minutes with rare min A for loudness. Loud /a/ targeted today to make louder speech more habitual with average loudness 83dB, with rare min A for loudness. Everyday sentences read with average loudness 80dB with rare min-mod cues for loudness. Conversation afterwards (simple to mod complex) of 15 minutes averaged 67dB with occasional min A for loudness/effort.  04/24/23: Pt taking his PD meds just prior to ST. Pt states he performed loud /a/ and sentences 5/7 days. Loud /a/ targeted today to make louder speech more habitual. Average loudness 85dB, with rare incr'd to occasional min A for loudness. Everyday sentences read with average loudness 80dB with occasional min-mod cues for  loudness. Pt req'd usual min-mod cues for loudness in 3-4 minute conversational segments. SLP encouraged pt to cont to practice consistently.   04/17/23: Pt states family is asking him less frequently for repeats. Today in short conversation segments of 3 minutes, pt maintained upper 60s dB average for 4 segments and then req'd incr'ing level of cueing to occasional min-mod A by 7th segment. SLP encouraged pt to cont to practice at home.   04/12/23: Loud /a/ targeted today to make louder speech more habitual. Average loudness 88dB, with usual min A for loudness. Everyday sentences read with average loudness 82dB with usual mod cues for loudness and tactile cues of abdominal recruitment. In sentence responses pt req'd min-mod A for loudness, occasionally. Pt appeared fatigued from previous therapies today and told SLP affirmatively he was fatigued when SLP asked him. SLP provided homework for sentence responses. CES returned today - results above.   04/10/23: Loud /a/ targeted today to make louder speech more habitual. Average loudness 85dB. Everyday sentences read with average loudness 83dB. SLP engaged in conversational topics (3-4 minutes) x5; SLP req'd to provide min-mod cues, rarely - which increased to mod cues occasionally by last topic. Pt acknowledged he was fatigued from PT where they worked on bed mobility. Pt provided 2 sentence responses with average 65dB with rare min-mod A for loudness. Other multiple sentence responses were challenging today due to decr'd divided attention negatively impacting pt performance today more than previous sessions, likely due to fatigue factor. SLP encouraged pt to cont to practice at home. "I always practice with my grandkids because I don't want them to have to ask me to repeat like [name] does."  04/03/23: SLP engaged pt with loud /a/ to encourage muscle memory to make louder speech more habitual. Average loudness 85dB. Initial cues for loudness but pt independent  after that. Everyday sentences were read with average 81dB.  Word level responses with higher cognitive load completed with average 70dB.  03/27/23: Pt arrived and greeted SLP with 70dB average, and then decr'd to  a soft volume once he sat down in ST room (average 64dB). Initial cues for louder speech and tactile cue for feeling strength in his voice from abdomen, thinking about effort for /a/.  SLP engaged pt in conversation for 38 minutes, with conversational segments of 8-13 minutes, x4. SLP req'd occasional mod cues for pt to improve loudness, faded to occasional min-mod cues. Pt demonstrated decr'd stamina approx 32 minutes into conversational segments. SLP reiterated the necessity to perform loud /a/ at home as prescribed when pt went to lobby to meet wife.   03/21/23 (eval): SPEECH: SLP explained eval results to pt. SLP ensured pt knew the importance of daily loud /a/ and everyday sentences practice BID.  SWALLOWING: Additionally, he and SLP practiced chin down posture with a straw and with a cup.  PATIENT EDUCATION: Education details: see "today's treatment" Person educated: Patient Education method: Explanation and Demonstration Education comprehension: verbalized understanding  HOME EXERCISE PROGRAM: Loud /a/ and everyday sentences.    GOALS: Goals reviewed with patient? No  SHORT TERM GOALS: Target date: 04/20/23  Produce /a/ with average mid-upper 80s dB over three sessions Baseline: 04/12/23 Goal status: Partially met  2.  pt will demo abdominal breathing with sentence responses 85% accuracy x 3 sessions  Baseline:  Goal status: not addressed yet  3.  pt will generate 2 minutes simple conversation with upper 60s dB average in 2 sessions  Baseline: 04/17/23 Goal status: Partially met  4.  Pt will use strategies for dysfluency in 1-2 minute short conversational segments  Baseline:  Goal status: deferred - dysfluency not heard in sessions thus far   LONG TERM GOALS:  Target date: 05/20/23, 06/29/23  pt will produce average upper 60s dB in 10 minutes simple to mod complex conversation in 5 sessions  Baseline: 06/19/23 Goal status: continue  2.  pt will maintain upper 60s dB average in 10 minute simple conversation x 3 sessions  Baseline: 04/26/23, 06/14/23 Goal status: met  3.  pt will use abdominal breathing >80% of the time during 10 minute simple conversation in 3 sessions  Baseline: 04/26/23, 06/14/23 Goal status: met  4.  pt will report fewer requests to repeat himself than prior to ST in 3 sessions  Baseline: 06/19/23 Goal status: continue  5.  Pt will use strategies for safer swallowing in 3 sessions with modified independence  Baseline: 06/19/23 Goal status: continue  6.  Pt will score higher/better on PROM in the last 2 weeks of therapy Baseline:  Goal status: deferred - will perform on last visit  ASSESSMENT:  CLINICAL IMPRESSION: Patient is a 73 y.o. male who was seen today for treatment of dysarthria, and of swallowing in light of Parkinsons Disease. See "today's treatment" for more details. Pt told SLP on eval date that family members ask him to repeat frequently, and he frequency of communicating in social situations has decr'd since last session of previous therapy course.   OBJECTIVE IMPAIRMENTS: Objective impairments include executive functioning, aphasia, dysarthria, and dysphagia. These impairments are limiting patient from household responsibilities, ADLs/IADLs, effectively communicating at home and in community, and safety when swallowing.Factors affecting potential to achieve goals and functional outcome are  none .Marland Kitchen Patient will benefit from skilled SLP services to address above impairments and improve overall function.  REHAB POTENTIAL: Good  PLAN:  SLP FREQUENCY: 1-2x/week  SLP DURATION:  6 sessions  PLANNED INTERVENTIONS: Aspiration precaution training, Diet toleration management , Environmental controls, Cueing hierachy,  Cognitive reorganization, Internal/external aids, Oral motor exercises, Functional  tasks, Multimodal communication approach, SLP instruction and feedback, Compensatory strategies, and Patient/family education    [facility], CCC-SLP 06/19/2023, 11:53 PM

## 2023-06-19 NOTE — Therapy (Signed)
OUTPATIENT OCCUPATIONAL THERAPY [facility] Treatment Note  Patient Name: Matthew [name] MRN: [id number] DOB:12/19/1950, 73 y.o., male Today's Date: 06/19/2023  PCP: Daisy Floro, MD REFERRING PROVIDER: Tat, Octaviano Batty, DO   END OF SESSION:  OT End of Session - 06/19/23 1303     Visit Number 14    Number of Visits 20    Date for OT Re-Evaluation 06/29/23    Authorization Type [employer]    OT Start Time 1234    OT Stop Time 1315    OT Time Calculation (min) 41 min    Behavior During Therapy WFL for tasks assessed/performed                          Past Medical History:  Diagnosis Date   Anxiety    Arthritis    GERD (gastroesophageal reflux disease)    occ remote history   Hypertension    Parkinson disease (HCC)    Prostate cancer (HCC)    Right knee pain    questionable meniscal tear   Seasonal allergies    Past Surgical History:  Procedure Laterality Date   COLONOSCOPY     Leg Laceration Repair  Left    Age 65   PELVIC LYMPH NODE DISSECTION Bilateral 03/22/2018   Procedure: PELVIC LYMPH NODE DISSECTION;  Surgeon: Rene Paci, MD;  Location: [facility];  Service: Urology;  Laterality: Bilateral;   PROSTATE BIOPSY     ROBOT ASSISTED LAPAROSCOPIC RADICAL PROSTATECTOMY N/A 03/22/2018   Procedure: XI ROBOTIC ASSISTED LAPAROSCOPIC PROSTATECTOMY;  Surgeon: Rene Paci, MD;  Location: [facility];  Service: Urology;  Laterality: N/A;   TOTAL KNEE ARTHROPLASTY Right 05/05/2019   Procedure: RIGHT TOTAL KNEE ARTHROPLASTY;  Surgeon: Gean Birchwood, MD;  Location: [facility];  Service: Orthopedics;  Laterality: Right;   Patient Active Problem List   Diagnosis Date Noted   S/P TKR (total knee replacement), right 05/05/2019   Osteoarthritis of right knee 05/02/2019   Prostate cancer (HCC) 03/22/2018   Malignant neoplasm of prostate (HCC) 02/27/2018    ONSET DATE: referral date 03/06/23  REFERRING DIAG: G20.A2 (ICD-10-CM) -  Parkinson's disease without dyskinesia, with fluctuating manifestations  THERAPY DIAG:  Other symptoms and signs involving the nervous system  Abnormal posture  Other lack of coordination  Muscle weakness (generalized)  Rationale for Evaluation and Treatment: Rehabilitation  SUBJECTIVE:   SUBJECTIVE STATEMENT: Pt reports difficulty with chewing and bite alignment, reporting increased biting of inside of cheek with decreased awareness.    Pt accompanied by: self (significant other dropped off pt)  PERTINENT HISTORY: PD, anxiety, arthritis, GERD, HTN, R TKR   PRECAUTIONS: Fall  WEIGHT BEARING RESTRICTIONS: No  PAIN:  Are you having pain? No  FALLS: Has patient fallen in last 6 months? Yes. Number of falls Pt reports "having a time where he fell frequently" but has fallen ~3 times in the last "few months" with most recent being Saturday.  LIVING ENVIRONMENT: Lives with: lives with their spouse Lives in: House/apartment Stairs:  Yes: Internal: full flight of step, but able to stay on main floor without need to navigate steps; and External: 2 steps; on right going up Has following equipment at home: Single point cane, Quad cane small base, Walker - 2 wheeled, Environmental consultant - 4 wheeled, Wheelchair (manual), shower chair, Grab bars, and transport chair - for dr appts  PLOF: Requires assistive device for independence and Needs assistance with ADLs  PATIENT GOALS: to be as  close to normal as possible  OBJECTIVE:  Note: Objective measures were completed at Evaluation unless otherwise noted.  HAND DOMINANCE: Right  ADLs: Overall ADLs: reports overall slowing, wife is "second pair of hands" Transfers/ambulation related to ADLs: utilizing small 4 wheeled walker for all mobility  Eating: wife assisting with cutting foods, pt spilling foods off utensil, constantly spilling things off the plate, having to use finger to stabilize food when scooping with R hand Grooming: utilizing electric  toothbrush and water pick for oral care, difficulty with brushing hair  UB Dressing: primarily wearing t-shirts/pull over shirts, occasional assist from wife if in a hurry or get stuck, no longer wearing button up shirts anymore.  Wife will assist with donning shirt if completing after shower LB Dressing: difficulty with reaching forward towards feet when donning pants, socks, shoes; increased difficulty when crossing leg , no longer wearing lace up shoes Toileting: requires momentum to stand up from toilet or will reach towards door for leverage/momentum, reports "struggle" with hygiene  Bathing: occasional assist for thoroughness when drying off  Tub Shower transfers: wife provides supervision, handle is just inside door and pt able to hold on to it while stepping over small ledge Equipment: Grab bars, Walk in shower, and Long handled sponge  IADLs: Spouse completes all shopping, meal prep, and housekeeping tasks. Community mobility: Relies on family or friends for transportation Medication management: pt fills pill box and is able to take correct dosages at correct time Financial management: wife completes and did prior Handwriting:  TBA  MOBILITY STATUS: Hx of falls and difficulty carrying objections with ambulation  POSTURE COMMENTS:  forward head  ACTIVITY TOLERANCE: Activity tolerance: diminished  FUNCTIONAL OUTCOME MEASURES: Physical performance test: PPT#2 (simulated eating) 25.91 & PPT#4 (donning/doffing jacket): TBA  COORDINATION: 9 Hole Peg test: Right: 1:36.03 sec; Left: 58.28 sec  UE ROM:   B shoulder flexion ~120 *, reports mild discomfort in R upper arm with internal rotation but able to achieve full ROM  UE MMT:    grossly 4/5 bilaterally  MUSCLE TONE: RUE: Mild and LUE: Mild  COGNITION: Overall cognitive status:  slower processing and speech  OBSERVATIONS: Bradykinesia   TODAY'S TREATMENT:                                                                                  DATE:  06/19/23 Coordination: engaged in picking up coins one at a time and placing into coin slot with focus on intentional movements when picking up and pushing through slot.  Pt with increased ease R > L. Self-feeding: engaged in scooping beans from bowl with spoon with focus on quality of movement and adding small tip of spoon away from body to ensure food items remain on spoon while reducing need to utilize other hand to stabilize food onto spoon.  Pt with improved control and decreased spillage when tipping spoon away from body to level out bowl of spoon.  Transitioned to scooping from plate, with pt demonstrating increased use of knife in other hand to push food onto utensil.  OT educated on possible benefit of angled utensil to aid in alignment of spoon to mouth.  Engaged in trial with  built up handle with scooping, pt demonstrating improved ease and reports of decreased discomfort of spoon handled in hand.  OT encouraged pt to utilize built up handle with self-feeding.     06/11/23 Pt engaged initially in towel raises with tension applied for overhead raises, torso twists/lateral all for 1x10 each with static hold and tactile cues. Added resistance push/pulls for additional weight bearing and engagement, 1x10 each direction.  Pt engaged in multiple [facility] activities for card flips, cutting card deck, in hand manipulation, etc all with cue for large exaggerated movements. Pt writing 4/4 familiar words/names with approx 60-70% legibility with cues for consistency, pacing, and size.    06/04/23 Large amplitude: engaged in bag exercises with focus on large amplitude, opening of shoulders and chest in preparation for simulated self-care tasks. LB dressing: utilized bag to simulate LB dressing.  Pt with difficulty reaching towards feet, therefore educated on propping foot on step stool to increase reach towards feet.  Pt demonstrating min-mod improvements with advancing bag over foot, however  still with min difficulty advancing bag from heel to toe when "doffing".  OT educated on use of reacher to aid in donning pants and/or pulling pants over heel.  OT providing demonstration of rolling waist band towards pant leg to increase ease with donning Depends.  Pt practicing aspects of above with step stool and bag, recommending pt attempt various aspects at home to assess if any increase independence and decrease burden of care.    PATIENT EDUCATION: Education details: condition specific education, large amplitude exercises Person educated: Patient Education method: Explanation Education comprehension: needs further education  HOME EXERCISE PROGRAM: Access Code: [email or web address] URL: [email or web address] Date: 04/10/2023 Prepared by: [facility] - Outpatient  Rehab - [facility]  Exercises - Seated Shoulder Shrugs  - 2 x daily - 5 reps - Seated Scapular Retraction  - 2 x daily - 5 reps - Seated Finger Flicks with Elbow Extension  - 1 x daily - 2 sets - 5-10 reps - Seated Reaching to Side and Across Body  - 1 x daily - 2 sets - 5-10 reps - Seated Reaching to Side  - 1 x daily - 2 sets - 5-10 reps  GOALS: Goals reviewed with patient? No  SHORT TERM GOALS: Target date: 06/08/23  Pt will be Independent with PD specific HEP. Baseline: Goal status: IN PROGRESS   LONG TERM GOALS: Target date: 06/29/23  Pt will demonstrate increased functional ROM and use of BUE to aid in clothing management and hygiene with toileting tasks.  Baseline:  Goal status: IN PROGRESS  2.  Pt will demonstrate improved ability to get up from lower seat/toilet without assistance with DME PRN. Baseline:  Goal status: IN PROGRESS  3.  Pt will demonstrate improved fine motor coordination for ADLs as evidenced by decreasing 9 hole peg test score for RUE by 10 secs on R and 1 or fewer drops. Baseline: R: 1:36 and L: 58 sec.  Pt reports dropping small items frequently Goal status: IN PROGRESS  4.   Pt will demonstrate improved ease with feeding as evidenced by decreasing PPT#2 (self feeding) by 5 secs  Baseline: 25.91 Goal status: IN PROGRESS  5.  Pt will demonstrate and/or report improved independence with LB dressing with use of AE/DME as needed.  Baseline:  Goal status: IN PROGRESS  ASSESSMENT:  CLINICAL IMPRESSION: Pt demonstrating improvements with use of built up handle with simulated self-feeding and reports understanding of recommendations of use of angled spoon and/or tipping  hand slightly backwards to maintain food on utensil.  Pt continues to engage in tasks and is understanding of strategies and problem solving to increase quality of movements and quality of life.  Pt continues to demonstrate symptoms consistent with PD diagnosis including small movements, bradykinesia, and poor posture but is receptive to education and cues for compensation.  PERFORMANCE DEFICITS: in functional skills including ADLs, IADLs, coordination, ROM, strength, flexibility, Fine motor control, Gross motor control, balance, body mechanics, endurance, decreased knowledge of precautions, decreased knowledge of use of DME, and UE functional use and psychosocial skills including coping strategies, environmental adaptation, and routines and behaviors.   IMPAIRMENTS: are limiting patient from ADLs, IADLs, and rest and sleep.     PLAN:  OT FREQUENCY: 1-2x/week  OT DURATION: 6 weeks  PLANNED INTERVENTIONS: 97168 OT Re-evaluation, 97535 self care/ADL training, 16109 therapeutic exercise, 97530 therapeutic activity, 97112 neuromuscular re-education, 97035 ultrasound, 97010 moist heat, 97010 cryotherapy, functional mobility training, psychosocial skills training, energy conservation, coping strategies training, patient/family education, and DME and/or AE instructions  RECOMMENDED OTHER SERVICES: NA  CONSULTED AND AGREED WITH PLAN OF CARE: Patient  PLAN FOR NEXT SESSION: Review and add to large amplitude  exercises, initiate bag exercises with focus on aspects of dressing, FMC tasks/HEP, Focus on sit > stand and standing balance.    Educate on modifications and DME/AE to aid in safety and independence with self-care tasks.  Review goals and discuss how self-feeding went, and prepare for d/c   Rosalio Loud, OTR/L 06/19/2023, 1:04 PM   [facility]patient Rehab at [facility] [address] [place], [address] [place], [place] [address] Phone # [phone] Fax # [phone]

## 2023-06-19 NOTE — Therapy (Unsigned)
OUTPATIENT PHYSICAL THERAPY NEURO TREATMENT Patient Name: Matthew [name] MRN: [id number] DOB:10-04-1950, 73 y.o., male Today's Date: 06/20/2023   PCP: Daisy Floro, MD REFERRING PROVIDER: Tat, Octaviano Batty, DO    END OF SESSION:  PT End of Session - 06/19/23 1322     Visit Number 17    Number of Visits 18    Date for PT Re-Evaluation 06/25/23    Authorization Type [employer]    Progress Note Due on Visit 20    PT Start Time 1320    PT Stop Time 1400    PT Time Calculation (min) 40 min    Equipment Utilized During Treatment Gait belt    Activity Tolerance Patient tolerated treatment well    Behavior During Therapy WFL for tasks assessed/performed                     Past Medical History:  Diagnosis Date   Anxiety    Arthritis    GERD (gastroesophageal reflux disease)    occ remote history   Hypertension    Parkinson disease (HCC)    Prostate cancer (HCC)    Right knee pain    questionable meniscal tear   Seasonal allergies    Past Surgical History:  Procedure Laterality Date   COLONOSCOPY     Leg Laceration Repair  Left    Age 71   PELVIC LYMPH NODE DISSECTION Bilateral 03/22/2018   Procedure: PELVIC LYMPH NODE DISSECTION;  Surgeon: Rene Paci, MD;  Location: [facility];  Service: Urology;  Laterality: Bilateral;   PROSTATE BIOPSY     ROBOT ASSISTED LAPAROSCOPIC RADICAL PROSTATECTOMY N/A 03/22/2018   Procedure: XI ROBOTIC ASSISTED LAPAROSCOPIC PROSTATECTOMY;  Surgeon: Rene Paci, MD;  Location: [facility];  Service: Urology;  Laterality: N/A;   TOTAL KNEE ARTHROPLASTY Right 05/05/2019   Procedure: RIGHT TOTAL KNEE ARTHROPLASTY;  Surgeon: Gean Birchwood, MD;  Location: [facility];  Service: Orthopedics;  Laterality: Right;   Patient Active Problem List   Diagnosis Date Noted   S/P TKR (total knee replacement), right 05/05/2019   Osteoarthritis of right knee 05/02/2019   Prostate cancer (HCC) 03/22/2018   Malignant neoplasm  of prostate (HCC) 02/27/2018    ONSET DATE: PD 2021  REFERRING DIAG: G20.A2 (ICD-10-CM) - Parkinson's disease without dyskinesia, with fluctuating manifestations (HCC)   THERAPY DIAG:  Unsteadiness on feet  Muscle weakness (generalized)  Abnormal posture  Rationale for Evaluation and Treatment: Rehabilitation  SUBJECTIVE:  SUBJECTIVE STATEMENT: Nothing new to report. Had a busy weekend. Pt accompanied by: self  PERTINENT HISTORY: PD, R TKR, prostate CA, HTN  PAIN:  Are you having pain? Yes: NPRS scale: 0/10 Pain location: midback Pain description: dull Aggravating factors: certain movements/times Relieving factors: rest  PRECAUTIONS: Fall  RED FLAGS: None   WEIGHT BEARING RESTRICTIONS: No  FALLS: Has patient fallen in last 6 months? Yes. Number of falls "several"  LIVING ENVIRONMENT: Lives with: lives with their family Lives in: House/apartment Stairs: Yes, 2-3 steps to enter Has following equipment at home: Dan Humphreys - 4 wheeled and hospital bed  PLOF: Needs assistance with ADLs, Needs assistance with transfers, and bed mobility  PATIENT GOALS: improve balance, mobility, strength. "Get as normal as I can". Be able to step up into son's Jeep  OBJECTIVE:    TODAY'S TREATMENT: 06/19/2023 Activity Comments  STS transfer from OT table Scoot back/push chair back, repeat x 4 reps with cues, to move chair back from table  Sit to stand, 2 x 5 reps from chair   Seated LAQ 5# Seated marching 5# Added arm motions, 2-3 sets of 10, cues for kicking targets to increase intensity of exercise; difficulty coordinating upper/lower body motions without smaller movement patterns  Standing at RW:  forward/back step and weightshift x 10                 HOME EXERCISE PROGRAM Last updated:  06/11/23 Access Code: [email or web address] URL: [email or web address] Date: 06/11/2023 Prepared by: [facility] - Outpatient  Rehab - [facility]  Exercises - Supine Hamstring Stretch with Strap  - 1 x daily - 5 x weekly - 2 sets - 30 sec hold - Supine Piriformis Stretch with Foot on Ground  - 1 x daily - 5 x weekly - 2 sets - 30 sec hold - Supine Figure 4 Piriformis Stretch  - 1 x daily - 5 x weekly - 2 sets - 30 sec hold - Standing Shoulder Row with Anchored Resistance  - 1 x daily - 5 x weekly - 2 sets - 10 reps - Chair Press Ups Forward and Backward  - 1 x daily - 5 x weekly - 2 sets - 5 reps - Standing Tricep Extensions with Resistance  - 1 x daily - 5 x weekly - 2 sets - 10 reps   PATIENT EDUCATION: Education details: 06/19/2023:  Discussed POC (pt had to cancel 2/3 visit and rescheduled to 2/5)-anticipating d/c next week; discussed community fitness options, including ACT, ACT chair yoga class, [employer] chair yoga (provided handout from January 2025 [employer]) Person educated: Patient Education method: Programmer, multimedia, Facilities manager, Actor cues, Verbal cues, and Handouts Education comprehension: verbalized understanding and returned demonstration      Note: Objective measures were completed at Evaluation unless otherwise noted.  DIAGNOSTIC FINDINGS: n/a for this episode  COGNITION: Overall cognitive status:  notes increased issues with memory   SENSATION: Not tested, denies parasthesias  COORDINATION: Deficits with rapid alternating movements Finger to nose intact    MUSCLE TONE: BLE rigidity  MUSCLE LENGTH: Hamstrings: Right -30 deg; Left -30 deg   DTRs:  NT  POSTURE: rounded shoulders, forward head, increased thoracic kyphosis, and flexed trunk   LOWER EXTREMITY ROM:     Active  Right Eval Left Eval  Hip flexion    Hip extension    Hip abduction    Hip adduction    Hip internal rotation    Hip external rotation    Knee  flexion    Knee extension -30 -30  Ankle dorsiflexion 10 10  Ankle plantarflexion    Ankle inversion    Ankle eversion     (Blank rows = not tested)  LOWER EXTREMITY MMT:    BLE 4/5 gross strength  BED MOBILITY:  Moderate assist  TRANSFERS: Assistive device utilized: Environmental consultant - 4 wheeled  Sit to stand: Complete Independence and Modified independence Stand to sit: Complete Independence Chair to chair: Complete Independence and Modified independence Floor:  NT    CURB:  Level of Assistance: CGA Assistive device utilized: Environmental consultant - 4 wheeled Curb Comments:   STAIRS: Reports stairs to 2nd floor but does not use 2-3 stairs to enter home  GAIT: Gait pattern: knee flexed in stance- Right and knee flexed in stance- Left Distance walked:  Assistive device utilized: Environmental consultant - 4 wheeled Level of assistance: Modified independence and SBA Comments:   FUNCTIONAL TESTS:  Mini-BESTest  11/28  Timed Up and Go test:26 sec with 4WW (compared to 15 sec)   10 meter walk test:15.31 sec = 2.14 ft/sec  (compared to 2.01 ft/sec)   5 time sit to stand test: 23 sec-(compared to 11 sec)    PATIENT EDUCATION: Education details: assessment details, rationale of intervention Person educated: Patient Education method: Explanation Education comprehension: verbalized understanding  HOME EXERCISE PROGRAM: TBD-reports daily performance 20 reps standing PWR! moves  GOALS: Goals reviewed with patient? Yes  SHORT TERM GOALS: Target date: 04/18/2023    Patient will be independent in HEP to improve functional outcomes Baseline: pt demo standing PWR! Moves + sit to stand Goal status: MET 04/17/2023  2.  Demo improved BLE strength and balance per time 15 sec 5xSTS test Baseline: 23>15.41 sec 04/17/2023 Goal status: MET 04/17/2023  3.  Demo improved safety with ambulation per time 15 sec TUG test Baseline: 34.53 sec>29.53 sec Goal status: IN PROGRESS    LONG TERM GOALS: Target date:  05/16/2023 (*Re-eval date on cert is 05/27/08, so LTGs due at that time)    Improve hamstring flexibility to -15 degrees to reduce rigidity and enable greater knee extension ROM for mobility Baseline: -30 Goal status: IN PROGRESS  2.  Reduce risk for falls per score 20/28 Mini-BESTest Baseline: 11/20 Goal status: IN PROGRESS  3.  Demo modified independent bed mobility to reduce level of assist from caregivers  Baseline: moderate assist  Goal status: IN PROGRESS  ASSESSMENT:  CLINICAL IMPRESSION: Patient arrived to session without complaints.  Educated pt on community fitness options, as POC is nearing end.  Pt seems interested in the chair yoga options (for PD).  He does well today with leg strengthening tasks to visual targets; he does have diminished amplitude with coordinated upper/lower body movements.  He plans to talk with wife about community fitness plans and understands we will likely plan for d/c next visit. OBJECTIVE IMPAIRMENTS: Abnormal gait, decreased activity tolerance, decreased balance, decreased coordination, decreased mobility, difficulty walking, decreased ROM, decreased strength, impaired flexibility, impaired tone, improper body mechanics, postural dysfunction, and pain.   ACTIVITY LIMITATIONS: carrying, lifting, bending, standing, squatting, stairs, transfers, bed mobility, reach over head, and locomotion level  PARTICIPATION LIMITATIONS: meal prep, cleaning, laundry, interpersonal relationship, shopping, community activity, yard work, and hobbies of music  PERSONAL FACTORS: Age, Past/current experiences, Time since onset of injury/illness/exacerbation, and 1-2 comorbidities: PMH  are also affecting patient's functional outcome.   REHAB POTENTIAL: Good  CLINICAL DECISION MAKING: Evolving/moderate complexity  EVALUATION COMPLEXITY: Moderate  PLAN:  PT FREQUENCY: 2x/week  PT DURATION: 8 weeks  PLANNED INTERVENTIONS: 97110-Therapeutic exercises, 97530-  Therapeutic activity, [id number]- Neuromuscular re-education, [phone]- Self Care, [address]- Manual therapy, [phone]- Gait training, [phone]- Orthotic Fit/training, [phone]- Canalith repositioning, [phone]- Aquatic Therapy, Patient/Family education, Balance training, Stair training, Taping, Dry Needling, Joint mobilization, Spinal mobilization, Vestibular training, DME instructions, Cryotherapy, and Moist heat  PLAN FOR NEXT SESSION: Ask about community exercises plan and check LTGs.  (Plan to discharge next visit; he had to reschedule the last visit, which was 2/3, to 2/5, so it will need to be recert/discharge.)   Lonia Blood, PT 06/20/23 7:52 PM Phone: [phone] Fax: [phone]   [facility] Health Outpatient Rehab at [facility] [name], [address] [place], [place] [address] Phone # [phone] Fax # [phone]

## 2023-06-21 ENCOUNTER — Other Ambulatory Visit (HOSPITAL_COMMUNITY): Payer: Self-pay

## 2023-06-22 ENCOUNTER — Ambulatory Visit: Payer: PPO

## 2023-06-22 DIAGNOSIS — R4701 Aphasia: Secondary | ICD-10-CM

## 2023-06-22 DIAGNOSIS — R29818 Other symptoms and signs involving the nervous system: Secondary | ICD-10-CM | POA: Diagnosis not present

## 2023-06-22 DIAGNOSIS — R1310 Dysphagia, unspecified: Secondary | ICD-10-CM

## 2023-06-22 DIAGNOSIS — R41841 Cognitive communication deficit: Secondary | ICD-10-CM

## 2023-06-22 DIAGNOSIS — R471 Dysarthria and anarthria: Secondary | ICD-10-CM

## 2023-06-22 NOTE — Therapy (Signed)
OUTPATIENT SPEECH LANGUAGE PATHOLOGY PARKINSON'S TREATMENT   Patient Name: Matthew [name] MRN: [id number] DOB:[birth year], 73 y.o., male Today's Date: 06/22/2023  PCP: Matthew Cranker, MD REFERRING PROVIDER: Kerin Salen, DO  END OF SESSION:  End of Session - 06/22/23 1115     Visit Number 15    Number of Visits 17    Date for SLP Re-Evaluation 06/29/23    SLP Start Time 1106    SLP Stop Time  1145    SLP Time Calculation (min) 39 min    Activity Tolerance Patient tolerated treatment well               Past Medical History:  Diagnosis Date   Anxiety    Arthritis    GERD (gastroesophageal reflux disease)    occ remote history   Hypertension    Parkinson disease (HCC)    Prostate cancer (HCC)    Right knee pain    questionable meniscal tear   Seasonal allergies    Past Surgical History:  Procedure Laterality Date   COLONOSCOPY     Leg Laceration Repair  Left    Age 54   PELVIC LYMPH NODE DISSECTION Bilateral 03/22/2018   Procedure: PELVIC LYMPH NODE DISSECTION;  Surgeon: Matthew Paci, MD;  Location: [facility];  Service: Urology;  Laterality: Bilateral;   PROSTATE BIOPSY     ROBOT ASSISTED LAPAROSCOPIC RADICAL PROSTATECTOMY N/A 03/22/2018   Procedure: XI ROBOTIC ASSISTED LAPAROSCOPIC PROSTATECTOMY;  Surgeon: Matthew Paci, MD;  Location: [facility];  Service: Urology;  Laterality: N/A;   TOTAL KNEE ARTHROPLASTY Right 05/05/2019   Procedure: RIGHT TOTAL KNEE ARTHROPLASTY;  Surgeon: Matthew Birchwood, MD;  Location: [facility];  Service: Orthopedics;  Laterality: Right;   Patient Active Problem List   Diagnosis Date Noted   S/P TKR (total knee replacement), right 05/05/2019   Osteoarthritis of right knee 05/02/2019   Prostate cancer (HCC) 03/22/2018   Malignant neoplasm of prostate (HCC) 02/27/2018     ONSET DATE: script dated 03-06-23  REFERRING DIAG: Parkinson's Disease  THERAPY DIAG:  Dysarthria and anarthria  Cognitive communication  deficit  Aphasia  Dysphagia, unspecified type  Rationale for Evaluation and Treatment: Rehabilitation  SUBJECTIVE:   SUBJECTIVE STATEMENT: Pt arrived with WNL volume.  Pt accompanied by: self  PERTINENT HISTORY: Pt well-known to this SLP from previous courses of ST. MBS in August 2023- results below. Pt's last ST course resulted in pt partially meeting goal for producing speech in upper 60s dB in simple to mod complex with occasional min A.  PAIN:  Are you having pain? No  FALLS: Has patient fallen in last 6 months?  See PT evaluation for details  PATIENT GOALS: Improve loudness and fluency  OBJECTIVE:   DIAGNOSTIC FINDINGS:  MBS 12-22-21 Patient presents with functional oropharyngeal swallowing without aspiration of any consistency tested. He does demonstrate minimal asymptomatic laryngeal penetration of thin liquid due to decreased timing of laryngeal closure. Chin tuck posture with use of straw effective to prevent penetration. Pharyngeal swallow is strong without retention. Oral transiting discoordination noted with pt swallowing barium tablet as he required 2nd bolus of thin to transit through oropharynx. Upon esophageal sweep, pt appeared with barium tablet retained in esophagus with pt awareness. Tablet appeared to transit distally after several boluses of liquids. After tablet had transited, pt continued with "phantom" sensation to esophagus. Recommend pt continue diet as tolerated, using chin tuck with liquids if improves comfort, decreases cough. Also encouraged pt to assure maintains strength of voice,  cough and expectoration for airway protection.    PATIENT REPORTED OUTCOME MEASURES (PROM): Communication Effectiveness Survey: pt returned CES on 04/12/23 with score of 18/40, with lower scores indicating more impact of pt's deficits on daily communication situations.   TODAY'S TREATMENT:                                                                                                                                          DATE:  06/22/23: SLP used loud /a/ to habituate incr'd loudness and effort in speech production in order to improve pt's speech volume in conversation. Average today was 89 dB. Everyday sentences average was 81dB. Sentence tasks were completed with  WNL volume x15. Short conversational segments completed with average 68 dB iniitally, but req'd cues as time progressed to maintain 68dB.  Today pt used chin tuck when cued. "I use it when I remember," pt stated.   06/19/23: SPEECH: SLP used loud /a/ to habituate incr'd loudness and effort in speech production in order to improve pt's speech volume in conversation. Average today was 86 dB. Everyday sentences average was 80dB. In two 10 minute segments of conversation pt averaged 69dB with rare min A for loudness.   SWALLOWING: SLP assessed pt's use of chin tuck with liquids - pt brought water with him today. SLP initially cued pt for chin tuck, reminding him of the recommendation from [facility] and pt recalled this. With 6 sips he placed chin down with initial cue but was independent by end of this portion of session. He said he would cont to use this at home as he stated it gave him more time to swallow.   06/14/23: Pt needs chin tuck with liquids targeted next session. Pt entered today with near-WNL loudness (69dB), slowly reduced to average 67dB after 10 minutes. After this, occasional min A necessary to maintain 67dB average. Pt told SLP it feels like he's shouting and SLP educated/re-educated pt that is due to his speech production system working at suboptimal levels and this has become his habit. When his system produces WNL speech volume it is working more than it usually does so he feels like he is shouting. SLP used digital recorder to demonstrate this for pt. Another 10 minute conversation averaged 66dB over 10 minutes. Suspect this was due to pt mental fatigue. "When I have to think about what I want to say I get  softer," pt stated.   06/12/23: "I have good days with my speech and then bad days."  Matthew [name] entered with suboptimal volume (approx 67dB). SLP used loud /a/ to habituate incr'd loudness and effort in speech production in order to improve pt's speech volume in conversation. Average today was 82 dB. Pt today appears more lethargic than in previous sessions. Conversational segments of 3-4 minutes req'd SLP to provide occasional min-mod A for loudness.   05/31/23: Pt average with loud /a/ 84  dB. Pt completed everyday sentences with average 81dB. In short conversational segments of 2-5 minutes pt req'd occasional min-mod A for loudness. SLP moved downward in hierarchy to 60-90 second segments and pt req'd occasional min A for WNL loudness. Pt needs more appointments due to missed visits.  05/14/23: Pt conversation mid 60s in 5 minutes conversation. "I just can't get loud - this thing really did me in," pt stated. SLP educated pt on how illness can affect people re: deconditioning and this may be worse for people with PD than people who do not have PD. Pt produced loud /a/ with mid 70s dB initially and with initial min cues from SLP improved this to average 82 dB. In short (1 minute) conversational segments/monologues, pt req'd occasional min A. This is not as successful as last 1-2 sessions.SLP encouraged pt to get back to practicing /a/ ASAP and he should improve average dB and duration within a few days. Pt agreed.   05/03/23: SLP and pt engaged in conversation for ~5 minute conversational segments x5 today, pt maintained average of 68dB with occasional min A for loudness. Pt endorses his loudness is better at home in the last week.   04/26/23: Pt talked with SLP in simple conversation for 15 minutes with rare min A for loudness. Loud /a/ targeted today to make louder speech more habitual with average loudness 83dB, with rare min A for loudness. Everyday sentences read with average loudness 80dB with rare min-mod  cues for loudness. Conversation afterwards (simple to mod complex) of 15 minutes averaged 67dB with occasional min A for loudness/effort.  04/24/23: Pt taking his PD meds just prior to ST. Pt states he performed loud /a/ and sentences 5/7 days. Loud /a/ targeted today to make louder speech more habitual. Average loudness 85dB, with rare incr'd to occasional min A for loudness. Everyday sentences read with average loudness 80dB with occasional min-mod cues for loudness. Pt req'd usual min-mod cues for loudness in 3-4 minute conversational segments. SLP encouraged pt to cont to practice consistently.   04/17/23: Pt states family is asking him less frequently for repeats. Today in short conversation segments of 3 minutes, pt maintained upper 60s dB average for 4 segments and then req'd incr'ing level of cueing to occasional min-mod A by 7th segment. SLP encouraged pt to cont to practice at home.   04/12/23: Loud /a/ targeted today to make louder speech more habitual. Average loudness 88dB, with usual min A for loudness. Everyday sentences read with average loudness 82dB with usual mod cues for loudness and tactile cues of abdominal recruitment. In sentence responses pt req'd min-mod A for loudness, occasionally. Pt appeared fatigued from previous therapies today and told SLP affirmatively he was fatigued when SLP asked him. SLP provided homework for sentence responses. CES returned today - results above.   04/10/23: Loud /a/ targeted today to make louder speech more habitual. Average loudness 85dB. Everyday sentences read with average loudness 83dB. SLP engaged in conversational topics (3-4 minutes) x5; SLP req'd to provide min-mod cues, rarely - which increased to mod cues occasionally by last topic. Pt acknowledged he was fatigued from PT where they worked on bed mobility. Pt provided 2 sentence responses with average 65dB with rare min-mod A for loudness. Other multiple sentence responses were challenging  today due to decr'd divided attention negatively impacting pt performance today more than previous sessions, likely due to fatigue factor. SLP encouraged pt to cont to practice at home. "I always practice with my grandkids  because I don't want them to have to ask me to repeat like [name] does."  04/03/23: SLP engaged pt with loud /a/ to encourage muscle memory to make louder speech more habitual. Average loudness 85dB. Initial cues for loudness but pt independent after that. Everyday sentences were read with average 81dB.  Word level responses with higher cognitive load completed with average 70dB.  03/27/23: Pt arrived and greeted SLP with 70dB average, and then decr'd to a soft volume once he sat down in ST room (average 64dB). Initial cues for louder speech and tactile cue for feeling strength in his voice from abdomen, thinking about effort for /a/.  SLP engaged pt in conversation for 38 minutes, with conversational segments of 8-13 minutes, x4. SLP req'd occasional mod cues for pt to improve loudness, faded to occasional min-mod cues. Pt demonstrated decr'd stamina approx 32 minutes into conversational segments. SLP reiterated the necessity to perform loud /a/ at home as prescribed when pt went to lobby to meet wife.   03/21/23 (eval): SPEECH: SLP explained eval results to pt. SLP ensured pt knew the importance of daily loud /a/ and everyday sentences practice BID.  SWALLOWING: Additionally, he and SLP practiced chin down posture with a straw and with a cup.  PATIENT EDUCATION: Education details: see "today's treatment" Person educated: Patient Education method: Explanation and Demonstration Education comprehension: verbalized understanding  HOME EXERCISE PROGRAM: Loud /a/ and everyday sentences.    GOALS: Goals reviewed with patient? No  SHORT TERM GOALS: Target date: 04/20/23  Produce /a/ with average mid-upper 80s dB over three sessions Baseline: 04/12/23 Goal status: Partially  met  2.  pt will demo abdominal breathing with sentence responses 85% accuracy x 3 sessions  Baseline:  Goal status: not addressed yet  3.  pt will generate 2 minutes simple conversation with upper 60s dB average in 2 sessions  Baseline: 04/17/23 Goal status: Partially met  4.  Pt will use strategies for dysfluency in 1-2 minute short conversational segments  Baseline:  Goal status: deferred - dysfluency not heard in sessions thus far   LONG TERM GOALS: Target date: 05/20/23, 06/29/23  pt will produce average upper 60s dB in 10 minutes simple to mod complex conversation in 5 sessions  Baseline: 06/19/23, 06/22/23 Goal status: continue  2.  pt will maintain upper 60s dB average in 10 minute simple conversation x 3 sessions  Baseline: 04/26/23, 06/14/23 Goal status: met  3.  pt will use abdominal breathing >80% of the time during 10 minute simple conversation in 3 sessions  Baseline: 04/26/23, 06/14/23 Goal status: met  4.  pt will report fewer requests to repeat himself than prior to ST in 3 sessions  Baseline: 06/19/23 Goal status: continue  5.  Pt will use strategies for safer swallowing in 3 sessions with modified independence  Baseline: 06/19/23 Goal status: continue  6.  Pt will score higher/better on PROM in the last 2 weeks of therapy Baseline:  Goal status: deferred - will perform on last visit  ASSESSMENT:  CLINICAL IMPRESSION: Patient is a 73 y.o. male who was seen today for treatment of dysarthria, and of swallowing in light of Parkinsons Disease. See "today's treatment" for more details. Pt told SLP on eval date that family members ask him to repeat frequently, and he frequency of communicating in social situations has decr'd since last session of previous therapy course.   OBJECTIVE IMPAIRMENTS: Objective impairments include executive functioning, aphasia, dysarthria, and dysphagia. These impairments are limiting patient from household  responsibilities, ADLs/IADLs,  effectively communicating at home and in community, and safety when swallowing.Factors affecting potential to achieve goals and functional outcome are  none .Marland Kitchen Patient will benefit from skilled SLP services to address above impairments and improve overall function.  REHAB POTENTIAL: Good  PLAN:  SLP FREQUENCY: 1-2x/week  SLP DURATION:  6 sessions  PLANNED INTERVENTIONS: Aspiration precaution training, Diet toleration management , Environmental controls, Cueing hierachy, Cognitive reorganization, Internal/external aids, Oral motor exercises, Functional tasks, Multimodal communication approach, SLP instruction and feedback, Compensatory strategies, and Patient/family education    [facility], CCC-SLP 06/22/2023, 11:16 AM

## 2023-06-25 ENCOUNTER — Other Ambulatory Visit: Payer: Self-pay

## 2023-06-25 ENCOUNTER — Encounter: Payer: PPO | Admitting: Occupational Therapy

## 2023-06-25 ENCOUNTER — Ambulatory Visit: Payer: PPO

## 2023-06-25 NOTE — Progress Notes (Signed)
 Assessment/Plan:   1.  Parkinsons Disease  -Increase carbidopa /levodopa  25/100, 2.5 tablets at 9 AM, 2.5 tablets at 11:30 AM, 2.5 tablets 2 PM, 2 tablets at 4:30 PM, 7 PM.    -Continue carbidopa /levodopa  50/200 CR at bedtime.  He goes to bed around 10:30-11pm.    -Continue ropinirole , 1 mg 3 times per day  -We can consider opicapone in the future.  -glad to see he is off of restore gold  -he is on lithium orotate OTC, which can produce tremor, but we haven't seen that.  Just started that  -Wife asked about some of the atypical states like PSP/CBGD.  I do not see any evidence of this.  She asked if there are further tests.  We discussed things like skin biopsies for alpha-synuclein, but I really do not see the need for these.  I certainly offered a second opinion and have no objection to this   2.  Sialorrhea  -Using Myobloc .  He wants to continue.  3.  Dysphagia  -Modified barium swallow done December 22, 2021 was unremarkable.  -discussed repeating and he doesn't want to do that right now.  4.  Urinary incontinence  -Following with urology this is not all Parkinson's related.  He has had prostate cancer with radiation Subjective:   Matthew [name] was seen today in follow up for Parkinsons disease.  My previous records were reviewed prior to todays visit as well as outside records available to me.  Wife with patient and supplements hx. last visit, we talked about retrying ropinirole .  He was resistant, but ultimately agreeable.  This time, he did not experience the incontinence that he had at the very low dose previous, nor did he have the headaches.  He has not had compulsive behaviors or sleep attacks.  He has been attending PT/OT/ST since last visit.  Notes are reviewed.  He plans to start [employer] exercises after that.  No falls but has had near falls.  No hallucinations.  He still has drooling and isn't sure that the myobloc  is helping for long.  Swallowing becoming somewhat more  difficult.   Current prescribed movement disorder medications: carbidopa /levodopa  25/100, 2 tablets at 9 AM, 11:30 AM, 2 PM, 4:30 PM, 7 PM Carbidopa /levodopa  50/200 CR at bed  Ropinirole , 1 mg 3 times per day (started last visit and he is taking at 8am/2pm/8pm)  PREVIOUS MEDICATIONS: Requip  (headache, drowsiness, incontinence at only 0.25 mg dose); rotigotine  (feet/leg swelling); pramipexole  (reported feet/ankles swelling at low-dose); selegeline (they thought it caused dizziness); entacapone  (I discontinued when patient complained about sleepiness); inbrija  (tried but didn't think helped);  ALLERGIES:   Allergies  Allergen Reactions   Other     Senior dose of flu shot, cause hear palpitations, anxious   Peanut-Containing Drug Products Other (See Comments)    Nasal congestion   Ropinirole  Other (See Comments)    Headache drowsiness trouble urinating     CURRENT MEDICATIONS:  Outpatient Encounter Medications as of 06/26/2023  Medication Sig   Botulinum Toxin Type B (MYOBLOC ) 5000 UNIT/ML SOLN Inject 5000 units into the face and neck every 120 days by the doctor   calcium carbonate (OS-CAL) 1250 (500 Ca) MG chewable tablet Chew 1 tablet by mouth daily. Takes Calcium Citrate 1000 mg daily   carbidopa -levodopa  (SINEMET  CR) 50-200 MG tablet TAKE 1 TABLET BY MOUTH EVERY DAY AT BEDTIME   carbidopa -levodopa  (SINEMET  IR) 25-100 MG tablet TAKE 2 TABLETS BY MOUTH AT 9AM, 1130AM, 2PM, 430PM, AND 7 PM  Coenzyme Q10 (CO Q-10) 100 MG CAPS Take by mouth.   Glutathione 500 MG CAPS Take by mouth.   docusate sodium  (COLACE) 100 MG capsule Take 100 mg by mouth daily.   fluticasone  (FLONASE ) 50 MCG/ACT nasal spray Place 1-2 sprays into both nostrils daily as needed for allergies or rhinitis.   Lithium Orotate 5 MG CAPS Take 1 tablet by mouth 3 (three) times daily as needed.   loratadine  (CLARITIN ) 10 MG tablet Take 10 mg by mouth daily.   NON FORMULARY Take 1 tablet by mouth daily. CDP Choline 500 mg    Omega-3 Fatty Acids (OMEGA 3 500 PO) Take by mouth.   rOPINIRole  (REQUIP ) 0.25 MG tablet requip  0.25 mg three times a day x 1 week and then 0.5 mg tid x three times a day for a week and then 0.75 mg three times a day for a week   rOPINIRole  (REQUIP ) 1 MG tablet TAKE 1 TABLET BY MOUTH 3 TIMES A DAY   vitamin B-12 (CYANOCOBALAMIN ) 500 MCG tablet Take 1,000 mcg by mouth daily.   [DISCONTINUED] zinc gluconate 50 MG tablet Take 50 mg by mouth daily.   No facility-administered encounter medications on file as of 06/26/2023.    Objective:   PHYSICAL EXAMINATION:    VITALS:   Vitals:   06/26/23 1435  BP: 112/68  Pulse: (!) 53  SpO2: 99%  Weight: 151 lb 6.4 oz (68.7 kg)  Height: 5' 6 (1.676 m)     GEN:  The patient appears stated age and is in NAD.   HEENT:  Normocephalic, atraumatic.  The mucous membranes are moist. The superficial temporal arteries are without ropiness or tenderness.  Has a drool cloth. CV:  bradycardic.  regular Lungs:  CTAB  Neurological examination:  Orientation: The patient is alert and oriented x3. Cranial nerves: There is good facial symmetry with facial hypomimia. The speech is fluent and dysarthric and hypophonic.  This is stable from prior visit.  Soft palate rises symmetrically and there is no tongue deviation. Hearing is intact to conversational tone. Sensation: Sensation is intact to light touch throughout Motor: Strength is at least antigravity x4.  Movement examination: Tone: There is nl tone in the bilateral UE and RLE and mild increased tone in the LLE (improved) Abnormal movements: none Coordination:  there is decremation on the L>R Gait and Station: The patient by pushing off.  He is slow to push off.  He is leaning to the L.  He is short stepped and flexed at the knees and shuffling.    I have reviewed and interpreted the following labs independently    Chemistry      Component Value Date/Time   NA 135 05/06/2019 0230   K 4.0 05/06/2019 0230    CL 104 05/06/2019 0230   CO2 24 05/06/2019 0230   BUN 22 05/06/2019 0230   CREATININE 0.98 05/06/2019 0230      Component Value Date/Time   CALCIUM 8.7 (L) 05/06/2019 0230       Lab Results  Component Value Date   WBC 9.6 05/06/2019   HGB 9.9 (L) 05/06/2019   HCT 28.9 (L) 05/06/2019   MCV 94.4 05/06/2019   PLT 141 (L) 05/06/2019    No results found for: TSH   Total time spent on today's visit was 40 minutes, including both face-to-face time and nonface-to-face time.  Time included that spent on review of records (prior notes available to me/labs/imaging if pertinent), discussing treatment and goals, answering patient's  questions and coordinating care.  Cc:  Okey Carlin Redbird, MD

## 2023-06-26 ENCOUNTER — Ambulatory Visit: Payer: PPO | Admitting: Neurology

## 2023-06-26 ENCOUNTER — Encounter: Payer: Self-pay | Admitting: Neurology

## 2023-06-26 VITALS — BP 112/68 | HR 53 | Ht 66.0 in | Wt 151.4 lb

## 2023-06-26 DIAGNOSIS — K117 Disturbances of salivary secretion: Secondary | ICD-10-CM

## 2023-06-26 DIAGNOSIS — R1319 Other dysphagia: Secondary | ICD-10-CM

## 2023-06-26 DIAGNOSIS — G20A2 Parkinson's disease without dyskinesia, with fluctuations: Secondary | ICD-10-CM

## 2023-06-26 MED ORDER — ROPINIROLE HCL 1 MG PO TABS: ORAL_TABLET | ORAL | 1 refills | Status: DC

## 2023-06-26 MED ORDER — CARBIDOPA-LEVODOPA 25-100 MG PO TABS: ORAL_TABLET | ORAL | 2 refills | Status: DC

## 2023-06-26 MED ORDER — CARBIDOPA-LEVODOPA ER 50-200 MG PO TBCR: EXTENDED_RELEASE_TABLET | ORAL | 2 refills | Status: DC

## 2023-06-26 NOTE — Therapy (Signed)
 OUTPATIENT PHYSICAL THERAPY NEURO DISCHARGE Patient Name: Matthew [name] MRN: [id number] DOB:[birth year], 73 y.o., male Today's Date: 06/27/2023   PCP: Okey Carlin Redbird, MD REFERRING PROVIDER: Evonnie Asberry RAMAN, DO   Progress Note Reporting Period 05/08/23 to 06/27/23  See note below for Objective Data and Assessment of Progress/Goals.     END OF SESSION:  PT End of Session - 06/27/23 1534     Visit Number 18    Number of Visits 18    Date for PT Re-Evaluation 06/27/23    Authorization Type [employer]    Progress Note Due on Visit 20    PT Start Time 1449    PT Stop Time 1532    PT Time Calculation (min) 43 min    Equipment Utilized During Treatment Gait belt    Activity Tolerance Patient tolerated treatment well    Behavior During Therapy WFL for tasks assessed/performed                      Past Medical History:  Diagnosis Date   Anxiety    Arthritis    GERD (gastroesophageal reflux disease)    occ remote history   Hypertension    Parkinson disease (HCC)    Prostate cancer (HCC)    Right knee pain    questionable meniscal tear   Seasonal allergies    Past Surgical History:  Procedure Laterality Date   COLONOSCOPY     Leg Laceration Repair  Left    Age 12   PELVIC LYMPH NODE DISSECTION Bilateral 03/22/2018   Procedure: PELVIC LYMPH NODE DISSECTION;  Surgeon: Devere Lonni Righter, MD;  Location: [facility];  Service: Urology;  Laterality: Bilateral;   PROSTATE BIOPSY     ROBOT ASSISTED LAPAROSCOPIC RADICAL PROSTATECTOMY N/A 03/22/2018   Procedure: XI ROBOTIC ASSISTED LAPAROSCOPIC PROSTATECTOMY;  Surgeon: Devere Lonni Righter, MD;  Location: [facility];  Service: Urology;  Laterality: N/A;   TOTAL KNEE ARTHROPLASTY Right 05/05/2019   Procedure: RIGHT TOTAL KNEE ARTHROPLASTY;  Surgeon: Liam Lerner, MD;  Location: [facility];  Service: Orthopedics;  Laterality: Right;   Patient Active Problem List   Diagnosis Date Noted   S/P TKR (total knee  replacement), right 05/05/2019   Osteoarthritis of right knee 05/02/2019   Prostate cancer (HCC) 03/22/2018   Malignant neoplasm of prostate (HCC) 02/27/2018    ONSET DATE: PD 2021  REFERRING DIAG: G20.A2 (ICD-10-CM) - Parkinson's disease without dyskinesia, with fluctuating manifestations (HCC)   THERAPY DIAG:  Unsteadiness on feet  Muscle weakness (generalized)  Abnormal posture  Other symptoms and signs involving the nervous system  Rationale for Evaluation and Treatment: Rehabilitation  SUBJECTIVE:  SUBJECTIVE STATEMENT: Carbidopa /Levodopa  was increased yesterday.   Pt accompanied by: self  PERTINENT HISTORY: PD, R TKR, prostate CA, HTN  PAIN:  Are you having pain? Yes: NPRS scale: 0/10 Pain location: midback Pain description: dull Aggravating factors: certain movements/times Relieving factors: rest  PRECAUTIONS: Fall  RED FLAGS: None   WEIGHT BEARING RESTRICTIONS: No  FALLS: Has patient fallen in last 6 months? Yes. Number of falls several  LIVING ENVIRONMENT: Lives with: lives with their family Lives in: House/apartment Stairs: Yes, 2-3 steps to enter Has following equipment at home: Vannie - 4 wheeled and hospital bed  PLOF: Needs assistance with ADLs, Needs assistance with transfers, and bed mobility  PATIENT GOALS: improve balance, mobility, strength. Get as normal as I can. Be able to step up into son's Jeep  OBJECTIVE:      TODAY'S TREATMENT: 06/27/23 Activity Comments  TUG 23.07 sec with 4WW; some festinating with turns   Mini Best test  16     [facility] PT Assessment - 06/27/23 0001       Mini-BESTest   Sit To Stand Normal: Comes to stand without use of hands and stabilizes independently.    Rise to Toes Moderate: Heels up, but not full range (smaller  than when holding hands), OR noticeable instability for 3 s.    Stand on one leg (left) Moderate: < 20 s    Stand on one leg (right) Moderate: < 20 s    Stand on one leg - lowest score 1    Compensatory Stepping Correction - Forward No step, OR would fall if not caught, OR falls spontaneously.    Compensatory Stepping Correction - Backward No step, OR would fall if not caught, OR falls spontaneously.    Compensatory Stepping Correction - Left Lateral Severe: Falls, or cannot step    Compensatory Stepping Correction - Right Lateral Severe:  Falls, or cannot step    Stepping Corredtion Lateral - lowest score 0    Stance - Feet together, eyes open, firm surface  Normal: 30s    Stance - Feet together, eyes closed, foam surface  Normal: 30s    Incline - Eyes Closed Normal: Stands independently 30s and aligns with gravity    Change in Gait Speed Normal: Significantly changes walkling speed without imbalance    Walk with head turns - Horizontal Normal: performs head turns with no change in gait speed and good balance    Walk with pivot turns Severe: Cannot turn with feet close at any speed without imbalance.    Step over obstacles Moderate: Steps over box but touches box OR displays cautious behavior by slowing gait.    Timed UP & GO with Dual Task Moderate: Dual Task affects either counting OR walking (>10%) when compared to the TUG without Dual Task.    Mini-BEST total score 16      Timed Up and Go Test   Normal TUG (seconds) 23.07    Manual TUG (seconds) 44.39    Cognitive TUG (seconds) 30.24    TUG Comments with 5TT              PATIENT EDUCATION: Education details: edu on exam findings and remaining impairments, discussed plans to continue fitness- pt reports plans to join [employer] moves class, possibly join [employer], and continue with dance class, got pt set up for eval in 6 months Person educated: Patient Education method: Explanation Education comprehension: verbalized  understanding   HOME EXERCISE PROGRAM Last updated: 06/11/23 Access Code: [email or web address]  URL: [email or web address] Date: 06/11/2023 Prepared by: [facility] - Outpatient  Rehab - [facility]  Exercises - Supine Hamstring Stretch with Strap  - 1 x daily - 5 x weekly - 2 sets - 30 sec hold - Supine Piriformis Stretch with Foot on Ground  - 1 x daily - 5 x weekly - 2 sets - 30 sec hold - Supine Figure 4 Piriformis Stretch  - 1 x daily - 5 x weekly - 2 sets - 30 sec hold - Standing Shoulder Row with Anchored Resistance  - 1 x daily - 5 x weekly - 2 sets - 10 reps - Chair Press Ups Forward and Backward  - 1 x daily - 5 x weekly - 2 sets - 5 reps - Standing Tricep Extensions with Resistance  - 1 x daily - 5 x weekly - 2 sets - 10 reps        Note: Objective measures were completed at Evaluation unless otherwise noted.  DIAGNOSTIC FINDINGS: n/a for this episode  COGNITION: Overall cognitive status:  notes increased issues with memory   SENSATION: Not tested, denies parasthesias  COORDINATION: Deficits with rapid alternating movements Finger to nose intact    MUSCLE TONE: BLE rigidity  MUSCLE LENGTH: Hamstrings: Right -30 deg; Left -30 deg   DTRs:  NT  POSTURE: rounded shoulders, forward head, increased thoracic kyphosis, and flexed trunk   LOWER EXTREMITY ROM:     Active  Right Eval Left Eval  Hip flexion    Hip extension    Hip abduction    Hip adduction    Hip internal rotation    Hip external rotation    Knee flexion    Knee extension -30 -30  Ankle dorsiflexion 10 10  Ankle plantarflexion    Ankle inversion    Ankle eversion     (Blank rows = not tested)  LOWER EXTREMITY MMT:    BLE 4/5 gross strength  BED MOBILITY:  Moderate assist  TRANSFERS: Assistive device utilized: [employer] - 4 wheeled  Sit to stand: Complete Independence and Modified independence Stand to sit: Complete Independence Chair to chair: Complete Independence and  Modified independence Floor:  NT    CURB:  Level of Assistance: CGA Assistive device utilized: [employer] - 4 wheeled Curb Comments:   STAIRS: Reports stairs to 2nd floor but does not use 2-3 stairs to enter home  GAIT: Gait pattern: knee flexed in stance- Right and knee flexed in stance- Left Distance walked:  Assistive device utilized: [employer] - 4 wheeled Level of assistance: Modified independence and SBA Comments:   FUNCTIONAL TESTS:  Mini-BESTest  11/28  Timed Up and Go test:26 sec with 4WW (compared to 15 sec)   10 meter walk test:15.31 sec = 2.14 ft/sec  (compared to 2.01 ft/sec)   5 time sit to stand test: 23 sec-(compared to 11 sec)    PATIENT EDUCATION: Education details: assessment details, rationale of intervention Person educated: Patient Education method: Explanation Education comprehension: verbalized understanding  HOME EXERCISE PROGRAM: TBD-reports daily performance 20 reps standing [employer]! moves  GOALS: Goals reviewed with patient? Yes  SHORT TERM GOALS: Target date: 04/18/2023    Patient will be independent in HEP to improve functional outcomes Baseline: pt demo standing [employer]! Moves + sit to stand Goal status: MET 04/17/2023  2.  Demo improved BLE strength and balance per time 15 sec 5xSTS test Baseline: 23>15.41 sec 04/17/2023 Goal status: MET 04/17/2023  3.  Demo improved safety with ambulation per time 15 sec  TUG test Baseline: 34.53 sec>29.53 sec> 23.07 sec with 4WW; some festinating with turns 06/27/23 Goal status: IMPROVED, NOT MET 06/27/23    LONG TERM GOALS: Target date: 05/16/2023 (*Re-eval date on cert is 11/24/72, so LTGs due at that time)    Improve hamstring flexibility to -15 degrees to reduce rigidity and enable greater knee extension ROM for mobility Baseline: -30; NT d/t time 06/27/23 Goal status: DEFERRED 06/27/23  2.  Reduce risk for falls per score 20/28 Mini-BESTest Baseline: 11/20; 16 06/27/23 Goal status: NOT MET 06/27/23  3.   Demo modified independent bed mobility to reduce level of assist from caregivers  Baseline: moderate assist; pt reports wife still has to assist quite a bit 06/27/23  Goal status: NOT MET 06/27/23  ASSESSMENT:  CLINICAL IMPRESSION: Patient arrived to session without complaints. Patient demonstrated improved gait speed with TUG testing but goal was not quite met. Patient scored 16 on Mini Best Test, still indicating an increased risk of falls but improved from last measurement. Patient reports that his wife has to help him "quite a bit" for LE management to lie down and sit up from bed. Patient has met some STGs, but has not met LTGs. At this time, patient will be D/C'd to take a break from therapy services, allow him to participate in fitness activities to maintain mobility, and plan to return for evaluation in 6 months d/t progressive nature of his diagnosis. Patient is agreement with this plan.  OBJECTIVE IMPAIRMENTS: Abnormal gait, decreased activity tolerance, decreased balance, decreased coordination, decreased mobility, difficulty walking, decreased ROM, decreased strength, impaired flexibility, impaired tone, improper body mechanics, postural dysfunction, and pain.   ACTIVITY LIMITATIONS: carrying, lifting, bending, standing, squatting, stairs, transfers, bed mobility, reach over head, and locomotion level  PARTICIPATION LIMITATIONS: meal prep, cleaning, laundry, interpersonal relationship, shopping, community activity, yard work, and hobbies of music  PERSONAL FACTORS: Age, Past/current experiences, Time since onset of injury/illness/exacerbation, and 1-2 comorbidities: PMH  are also affecting patient's functional outcome.   REHAB POTENTIAL: Good  CLINICAL DECISION MAKING: Evolving/moderate complexity  EVALUATION COMPLEXITY: Moderate  PLAN:  PT FREQUENCY: 2x/week  PT DURATION: 8 weeks  PLANNED INTERVENTIONS: 97110-Therapeutic exercises, 97530- Therapeutic activity, 97112-  Neuromuscular re-education, 97535- Self Care, [id number]- Manual therapy, [phone]- Gait training, [phone]- Orthotic Fit/training, [phone]- Canalith repositioning, [phone]- Aquatic Therapy, Patient/Family education, Balance training, Stair training, Taping, Dry Needling, Joint mobilization, Spinal mobilization, Vestibular training, DME instructions, Cryotherapy, and Moist heat  PLAN FOR NEXT SESSION: DC at this time  PHYSICAL THERAPY DISCHARGE SUMMARY  Visits from Start of Care: 18  Current functional level related to goals / functional outcomes: See above clinical impression   Remaining deficits: Difficulty with bed mobility, imbalance, decreased gait speed    Education / Equipment: HEP  Plan: Patient agrees to discharge.  Patient goals were partially met. Patient is being discharged due to plateau in progress.       Louana Terrilyn Christians, PT, DPT 06/27/23 3:40 PM  [facility] at [facility] [address] [place], [address] [place], [place] [address] Phone # [phone] Fax # [phone]

## 2023-06-26 NOTE — Patient Instructions (Addendum)
Increase carbidopa/levodopa 25/100, 2.5 tablets at 9 AM, 2.5 tablets at 11:30 AM, 2.5 tablets 2 PM, 2 tablets at 4:30 PM, 7 PM.   continue carbidopa/levodopa 50/200 CR at bedtime Continue ropinirole 1 mg three times per day

## 2023-06-27 ENCOUNTER — Encounter: Payer: Self-pay | Admitting: Physical Therapy

## 2023-06-27 ENCOUNTER — Ambulatory Visit: Payer: PPO | Admitting: Occupational Therapy

## 2023-06-27 ENCOUNTER — Ambulatory Visit: Payer: PPO | Attending: Neurology

## 2023-06-27 ENCOUNTER — Ambulatory Visit: Payer: PPO | Admitting: Physical Therapy

## 2023-06-27 ENCOUNTER — Other Ambulatory Visit: Payer: Self-pay

## 2023-06-27 DIAGNOSIS — R293 Abnormal posture: Secondary | ICD-10-CM

## 2023-06-27 DIAGNOSIS — R278 Other lack of coordination: Secondary | ICD-10-CM | POA: Insufficient documentation

## 2023-06-27 DIAGNOSIS — R29818 Other symptoms and signs involving the nervous system: Secondary | ICD-10-CM | POA: Insufficient documentation

## 2023-06-27 DIAGNOSIS — R41841 Cognitive communication deficit: Secondary | ICD-10-CM | POA: Diagnosis not present

## 2023-06-27 DIAGNOSIS — R2681 Unsteadiness on feet: Secondary | ICD-10-CM

## 2023-06-27 DIAGNOSIS — M6281 Muscle weakness (generalized): Secondary | ICD-10-CM

## 2023-06-27 DIAGNOSIS — R471 Dysarthria and anarthria: Secondary | ICD-10-CM | POA: Insufficient documentation

## 2023-06-27 DIAGNOSIS — R4701 Aphasia: Secondary | ICD-10-CM | POA: Diagnosis not present

## 2023-06-27 NOTE — Progress Notes (Signed)
 Specialty Pharmacy Refill Coordination Note  Matthew [name] is a 73 y.o. male contacted today regarding refills of specialty medication(s) RimabotulinumtoxinB  (Myobloc )   Patient requested Courier to Provider Office   Delivery date: 07/05/23   Verified address: [facility] [address] [place] [address] [place],  [place] [address]   Medication will be filled on 07/04/23.

## 2023-06-27 NOTE — Therapy (Signed)
 OUTPATIENT SPEECH LANGUAGE PATHOLOGY PARKINSON'S TREATMENT   Patient Name: Matthew [name] MRN: [id number] DOB:1951-03-09, 73 y.o., male Today's Date: 06/27/2023  PCP: Okey Dunnings, MD REFERRING PROVIDER: Evonnie Stabs, DO  END OF SESSION:  End of Session - 06/27/23 1656     Visit Number 16    Number of Visits 17    Date for SLP Re-Evaluation 06/29/23    SLP Start Time 1617    SLP Stop Time  1700    SLP Time Calculation (min) 43 min    Activity Tolerance Patient tolerated treatment well                Past Medical History:  Diagnosis Date   Anxiety    Arthritis    GERD (gastroesophageal reflux disease)    occ remote history   Hypertension    Parkinson disease (HCC)    Prostate cancer (HCC)    Right knee pain    questionable meniscal tear   Seasonal allergies    Past Surgical History:  Procedure Laterality Date   COLONOSCOPY     Leg Laceration Repair  Left    Age 89   PELVIC LYMPH NODE DISSECTION Bilateral 03/22/2018   Procedure: PELVIC LYMPH NODE DISSECTION;  Surgeon: Devere Lonni Righter, MD;  Location: [facility];  Service: Urology;  Laterality: Bilateral;   PROSTATE BIOPSY     ROBOT ASSISTED LAPAROSCOPIC RADICAL PROSTATECTOMY N/A 03/22/2018   Procedure: XI ROBOTIC ASSISTED LAPAROSCOPIC PROSTATECTOMY;  Surgeon: Devere Lonni Righter, MD;  Location: [facility];  Service: Urology;  Laterality: N/A;   TOTAL KNEE ARTHROPLASTY Right 05/05/2019   Procedure: RIGHT TOTAL KNEE ARTHROPLASTY;  Surgeon: Liam Lerner, MD;  Location: [facility];  Service: Orthopedics;  Laterality: Right;   Patient Active Problem List   Diagnosis Date Noted   S/P TKR (total knee replacement), right 05/05/2019   Osteoarthritis of right knee 05/02/2019   Prostate cancer (HCC) 03/22/2018   Malignant neoplasm of prostate (HCC) 02/27/2018     ONSET DATE: script dated 03-06-23  REFERRING DIAG: Parkinson's Disease  THERAPY DIAG:  Dysarthria and anarthria  Cognitive communication  deficit  Aphasia  Rationale for Evaluation and Treatment: Rehabilitation  SUBJECTIVE:   SUBJECTIVE STATEMENT: She said three times a day out of the 5 I take carbidopa -levadopa, take another half.  Pt accompanied by: self  PERTINENT HISTORY: Pt well-known to this SLP from previous courses of ST. MBS in August 2023- results below. Pt's last ST course resulted in pt partially meeting goal for producing speech in upper 60s dB in simple to mod complex with occasional min A.  PAIN:  Are you having pain? No  FALLS: Has patient fallen in last 6 months?  See PT evaluation for details  PATIENT GOALS: Improve loudness and fluency  OBJECTIVE:   DIAGNOSTIC FINDINGS:  MBS 12-22-21 Patient presents with functional oropharyngeal swallowing without aspiration of any consistency tested. He does demonstrate minimal asymptomatic laryngeal penetration of thin liquid due to decreased timing of laryngeal closure. Chin tuck posture with use of straw effective to prevent penetration. Pharyngeal swallow is strong without retention. Oral transiting discoordination noted with pt swallowing barium tablet as he required 2nd bolus of thin to transit through oropharynx. Upon esophageal sweep, pt appeared with barium tablet retained in esophagus with pt awareness. Tablet appeared to transit distally after several boluses of liquids. After tablet had transited, pt continued with "phantom" sensation to esophagus. Recommend pt continue diet as tolerated, using chin tuck with liquids if improves comfort, decreases cough. Also  encouraged pt to assure maintains strength of voice, cough and expectoration for airway protection.    PATIENT REPORTED OUTCOME MEASURES (PROM): Communication Effectiveness Survey: pt returned CES on 04/12/23 with score of 18/40, with lower scores indicating more impact of pt's deficits on daily communication situations.   TODAY'S TREATMENT:                                                                                                                                          DATE:  06/27/23: PT req'd total A to use chin tuck. When he did so he did not exhibit s/sx pharyngeal dysphagia.  Pt saw Dr. Evonnie yesterday (see s). Eleven reps of loud /a/ completed with average 90dB with rare min A for loudness. He forgot sentences today, so SLP transitioned to practical speaking situations to work with louder speech. In simple conversation of 8 minutes pt averaged 68 dB. After this time pt's volume decr'd to average 64dB and pt req'd mod cues usually for remaining loud in the next 5 minutes. SLP asked pt questions requiring sentence responses and he req'd mod A occasionally for loudness. SLP felt if pt completed loud /a/ this may assist in muscle memory so SLP had pt complete 2 loud /a/ and pt average volume incr'd to average 67 for 3 1/2 minutes and then decr'd again to average 64dB despite usual mod cues from SLP. I'm tired today, Lupita, pt stated at that time.  Pt completed PT and OT courses today - gave pt option of cancelling next week's ST appointments but made pt know SLP was also happy to finish up next week without PT/OT. Pt to let SLP know his desire.   06/22/23: SLP used loud /a/ to habituate incr'd loudness and effort in speech production in order to improve pt's speech volume in conversation. Average today was 89 dB. Everyday sentences average was 81dB. Sentence tasks were completed with  WNL volume x15. Short conversational segments completed with average 68 dB iniitally, but req'd cues as time progressed to maintain 68dB.  Today pt used chin tuck when cued. I use it when I remember, pt stated.   06/19/23: SPEECH: SLP used loud /a/ to habituate incr'd loudness and effort in speech production in order to improve pt's speech volume in conversation. Average today was 86 dB. Everyday sentences average was 80dB. In two 10 minute segments of conversation pt averaged 69dB with rare min A for loudness.    SWALLOWING: SLP assessed pt's use of chin tuck with liquids - pt brought water  with him today. SLP initially cued pt for chin tuck, reminding him of the recommendation from MBS and pt recalled this. With 6 sips he placed chin down with initial cue but was independent by end of this portion of session. He said he would cont to use this at home as he stated it gave him more time to swallow.  06/14/23: Pt needs chin tuck with liquids targeted next session. Pt entered today with near-WNL loudness (69dB), slowly reduced to average 67dB after 10 minutes. After this, occasional min A necessary to maintain 67dB average. Pt told SLP it feels like he's shouting and SLP educated/re-educated pt that is due to his speech production system working at suboptimal levels and this has become his habit. When his system produces WNL speech volume it is working more than it usually does so he feels like he is shouting. SLP used digital recorder to demonstrate this for pt. Another 10 minute conversation averaged 66dB over 10 minutes. Suspect this was due to pt mental fatigue. When I have to think about what I want to say I get softer, pt stated.   06/12/23: I have good days with my speech and then bad days.  [name] entered with suboptimal volume (approx 67dB). SLP used loud /a/ to habituate incr'd loudness and effort in speech production in order to improve pt's speech volume in conversation. Average today was 82 dB. Pt today appears more lethargic than in previous sessions. Conversational segments of 3-4 minutes req'd SLP to provide occasional min-mod A for loudness.   05/31/23: Pt average with loud /a/ 84 dB. Pt completed everyday sentences with average 81dB. In short conversational segments of 2-5 minutes pt req'd occasional min-mod A for loudness. SLP moved downward in hierarchy to 60-90 second segments and pt req'd occasional min A for WNL loudness. Pt needs more appointments due to missed visits.  05/14/23: Pt  conversation mid 60s in 5 minutes conversation. I just can't get loud - this thing really did me in, pt stated. SLP educated pt on how illness can affect people re: deconditioning and this may be worse for people with PD than people who do not have PD. Pt produced loud /a/ with mid 70s dB initially and with initial min cues from SLP improved this to average 82 dB. In short (1 minute) conversational segments/monologues, pt req'd occasional min A. This is not as successful as last 1-2 sessions.SLP encouraged pt to get back to practicing /a/ ASAP and he should improve average dB and duration within a few days. Pt agreed.   05/03/23: SLP and pt engaged in conversation for ~5 minute conversational segments x5 today, pt maintained average of 68dB with occasional min A for loudness. Pt endorses his loudness is better at home in the last week.   04/26/23: Pt talked with SLP in simple conversation for 15 minutes with rare min A for loudness. Loud /a/ targeted today to make louder speech more habitual with average loudness 83dB, with rare min A for loudness. Everyday sentences read with average loudness 80dB with rare min-mod cues for loudness. Conversation afterwards (simple to mod complex) of 15 minutes averaged 67dB with occasional min A for loudness/effort.  04/24/23: Pt taking his PD meds just prior to ST. Pt states he performed loud /a/ and sentences 5/7 days. Loud /a/ targeted today to make louder speech more habitual. Average loudness 85dB, with rare incr'd to occasional min A for loudness. Everyday sentences read with average loudness 80dB with occasional min-mod cues for loudness. Pt req'd usual min-mod cues for loudness in 3-4 minute conversational segments. SLP encouraged pt to cont to practice consistently.   04/17/23: Pt states family is asking him less frequently for repeats. Today in short conversation segments of 3 minutes, pt maintained upper 60s dB average for 4 segments and then req'd incr'ing  level of cueing to occasional min-mod  A by 7th segment. SLP encouraged pt to cont to practice at home.   04/12/23: Loud /a/ targeted today to make louder speech more habitual. Average loudness 88dB, with usual min A for loudness. Everyday sentences read with average loudness 82dB with usual mod cues for loudness and tactile cues of abdominal recruitment. In sentence responses pt req'd min-mod A for loudness, occasionally. Pt appeared fatigued from previous therapies today and told SLP affirmatively he was fatigued when SLP asked him. SLP provided homework for sentence responses. CES returned today - results above.   04/10/23: Loud /a/ targeted today to make louder speech more habitual. Average loudness 85dB. Everyday sentences read with average loudness 83dB. SLP engaged in conversational topics (3-4 minutes) x5; SLP req'd to provide min-mod cues, rarely - which increased to mod cues occasionally by last topic. Pt acknowledged he was fatigued from PT where they worked on bed mobility. Pt provided 2 sentence responses with average 65dB with rare min-mod A for loudness. Other multiple sentence responses were challenging today due to decr'd divided attention negatively impacting pt performance today more than previous sessions, likely due to fatigue factor. SLP encouraged pt to cont to practice at home. I always practice with my grandkids because I don't want them to have to ask me to repeat like [name] does.  04/03/23: SLP engaged pt with loud /a/ to encourage muscle memory to make louder speech more habitual. Average loudness 85dB. Initial cues for loudness but pt independent after that. Everyday sentences were read with average 81dB.  Word level responses with higher cognitive load completed with average 70dB.  03/27/23: Pt arrived and greeted SLP with 70dB average, and then decr'd to a soft volume once he sat down in ST room (average 64dB). Initial cues for louder speech and tactile cue for feeling  strength in his voice from abdomen, thinking about effort for /a/.  SLP engaged pt in conversation for 38 minutes, with conversational segments of 8-13 minutes, x4. SLP req'd occasional mod cues for pt to improve loudness, faded to occasional min-mod cues. Pt demonstrated decr'd stamina approx 32 minutes into conversational segments. SLP reiterated the necessity to perform loud /a/ at home as prescribed when pt went to lobby to meet wife.   03/21/23 (eval): SPEECH: SLP explained eval results to pt. SLP ensured pt knew the importance of daily loud /a/ and everyday sentences practice BID.  SWALLOWING: Additionally, he and SLP practiced chin down posture with a straw and with a cup.  PATIENT EDUCATION: Education details: see today's treatment Person educated: Patient Education method: Explanation and Demonstration Education comprehension: verbalized understanding  HOME EXERCISE PROGRAM: Loud /a/ and everyday sentences.    GOALS: Goals reviewed with patient? No  SHORT TERM GOALS: Target date: 04/20/23  Produce /a/ with average mid-upper 80s dB over three sessions Baseline: 04/12/23 Goal status: Partially met  2.  pt will demo abdominal breathing with sentence responses 85% accuracy x 3 sessions  Baseline:  Goal status: not addressed yet  3.  pt will generate 2 minutes simple conversation with upper 60s dB average in 2 sessions  Baseline: 04/17/23 Goal status: Partially met  4.  Pt will use strategies for dysfluency in 1-2 minute short conversational segments  Baseline:  Goal status: deferred - dysfluency not heard in sessions thus far   LONG TERM GOALS: Target date: 05/20/23, 06/29/23  pt will produce average upper 60s dB in 10 minutes simple to mod complex conversation in 5 sessions  Baseline: 06/19/23, 06/22/23 Goal status:  continue  2.  pt will maintain upper 60s dB average in 10 minute simple conversation x 3 sessions  Baseline: 04/26/23, 06/14/23 Goal status: met  3.  pt  will use abdominal breathing >80% of the time during 10 minute simple conversation in 3 sessions  Baseline: 04/26/23, 06/14/23 Goal status: met  4.  pt will report fewer requests to repeat himself than prior to ST in 3 sessions  Baseline: 06/19/23 Goal status: continue  5.  Pt will use strategies for safer swallowing in 3 sessions with modified independence  Baseline: 06/19/23 Goal status: continue  6.  Pt will score higher/better on PROM in the last 2 weeks of therapy Baseline:  Goal status: deferred - will perform on last visit  ASSESSMENT:  CLINICAL IMPRESSION: Patient is a 73 y.o. male who was seen today for treatment of dysarthria, and of swallowing in light of Parkinsons Disease. See today's treatment for more details. Pt told SLP on eval date that family members ask him to repeat frequently, and he frequency of communicating in social situations has decr'd since last session of previous therapy course.   OBJECTIVE IMPAIRMENTS: Objective impairments include executive functioning, aphasia, dysarthria, and dysphagia. These impairments are limiting patient from household responsibilities, ADLs/IADLs, effectively communicating at home and in community, and safety when swallowing.Factors affecting potential to achieve goals and functional outcome are  none .SABRA Patient will benefit from skilled SLP services to address above impairments and improve overall function.  REHAB POTENTIAL: Good  PLAN:  SLP FREQUENCY: 1-2x/week  SLP DURATION:  6 sessions  PLANNED INTERVENTIONS: Aspiration precaution training, Diet toleration management , Environmental controls, Cueing hierachy, Cognitive reorganization, Internal/external aids, Oral motor exercises, Functional tasks, Multimodal communication approach, SLP instruction and feedback, Compensatory strategies, and Patient/family education    [facility], CCC-SLP 06/27/2023, 5:01 PM

## 2023-06-27 NOTE — Therapy (Signed)
 OUTPATIENT OCCUPATIONAL THERAPY [facility] Treatment Note & Discharge Note  Patient Name: Matthew [name] MRN: [id number] DOB:08/28/1950, 73 y.o., male Today's Date: 06/28/2023  PCP: Okey Carlin Redbird, MD REFERRING PROVIDER: Tat, Asberry RAMAN, DO  OCCUPATIONAL THERAPY DISCHARGE SUMMARY  Visits from Start of Care: 15  Current functional level related to goals / functional outcomes: Pt has met 2 of 5 LTGs with ability to utilize strategies and AE to continue to allow for independent engagement in self-care tasks and bathroom transfers, however still requiring significantly increased time.  Pt reports tired today and expressing desire to take a break from therapies and continue to focus on strategies in home and utilize community resources, dance class etc, for continued mobility.   Remaining deficits: Bradykinesia impacting mobility, self-care, and fine motor control tasks.   Education / Equipment: Large amplitude exercises, intentional movements during transitions, bag exercises for carryover to self-care tasks, modifications to utensils to increase ease with self-feeding.   Patient agrees to discharge. Patient goals were partially met. Patient is being discharged due to  maximizing therapy at this time, with focus on engage in HEP and community resources with planned return for evaluation in 6 months.     END OF SESSION:  OT End of Session - 06/27/23 1712     Visit Number 15    Number of Visits 20    Date for OT Re-Evaluation 06/29/23    Authorization Type [employer]    OT Start Time 1534    OT Stop Time 1618    OT Time Calculation (min) 44 min    Behavior During Therapy WFL for tasks assessed/performed                           Past Medical History:  Diagnosis Date   Anxiety    Arthritis    GERD (gastroesophageal reflux disease)    occ remote history   Hypertension    Parkinson disease (HCC)    Prostate cancer (HCC)    Right knee pain     questionable meniscal tear   Seasonal allergies    Past Surgical History:  Procedure Laterality Date   COLONOSCOPY     Leg Laceration Repair  Left    Age 50   PELVIC LYMPH NODE DISSECTION Bilateral 03/22/2018   Procedure: PELVIC LYMPH NODE DISSECTION;  Surgeon: Devere Lonni Righter, MD;  Location: [facility];  Service: Urology;  Laterality: Bilateral;   PROSTATE BIOPSY     ROBOT ASSISTED LAPAROSCOPIC RADICAL PROSTATECTOMY N/A 03/22/2018   Procedure: XI ROBOTIC ASSISTED LAPAROSCOPIC PROSTATECTOMY;  Surgeon: Devere Lonni Righter, MD;  Location: [facility];  Service: Urology;  Laterality: N/A;   TOTAL KNEE ARTHROPLASTY Right 05/05/2019   Procedure: RIGHT TOTAL KNEE ARTHROPLASTY;  Surgeon: Liam Lerner, MD;  Location: [facility];  Service: Orthopedics;  Laterality: Right;   Patient Active Problem List   Diagnosis Date Noted   S/P TKR (total knee replacement), right 05/05/2019   Osteoarthritis of right knee 05/02/2019   Prostate cancer (HCC) 03/22/2018   Malignant neoplasm of prostate (HCC) 02/27/2018    ONSET DATE: referral date 03/06/23  REFERRING DIAG: G20.A2 (ICD-10-CM) - Parkinson's disease without dyskinesia, with fluctuating manifestations  THERAPY DIAG:  Other symptoms and signs involving the nervous system  Muscle weakness (generalized)  Unsteadiness on feet  Other lack of coordination  Rationale for Evaluation and Treatment: Rehabilitation  SUBJECTIVE:   SUBJECTIVE STATEMENT: Pt reports that MD increased his carbidopa -levodopa  which started this morning.  Pt reports feeling somewhat more shaky today, recognizing that he has felt this way before so is unsure if it has to do with increase in meds.    Pt reports increased shakiness with small movements and picking up things.    Pt accompanied by: self (significant other dropped off pt)  PERTINENT HISTORY: PD, anxiety, arthritis, GERD, HTN, R TKR   PRECAUTIONS: Fall  WEIGHT BEARING RESTRICTIONS: No  PAIN:  Are you  having pain? No  FALLS: Has patient fallen in last 6 months? Yes. Number of falls Pt reports having a time where he fell frequently but has fallen ~3 times in the last few months with most recent being Saturday.  LIVING ENVIRONMENT: Lives with: lives with their spouse Lives in: House/apartment Stairs:  Yes: Internal: full flight of step, but able to stay on main floor without need to navigate steps; and External: 2 steps; on right going up Has following equipment at home: Single point cane, Quad cane small base, Walker - 2 wheeled, Environmental Consultant - 4 wheeled, Wheelchair (manual), shower chair, Grab bars, and transport chair - for dr appts  PLOF: Requires assistive device for independence and Needs assistance with ADLs  PATIENT GOALS: to be as close to normal as possible  OBJECTIVE:  Note: Objective measures were completed at Evaluation unless otherwise noted.  HAND DOMINANCE: Right  ADLs: Overall ADLs: reports overall slowing, wife is second pair of hands Transfers/ambulation related to ADLs: utilizing small 4 wheeled walker for all mobility  Eating: wife assisting with cutting foods, pt spilling foods off utensil, constantly spilling things off the plate, having to use finger to stabilize food when scooping with R hand Grooming: utilizing electric toothbrush and water  pick for oral care, difficulty with brushing hair  UB Dressing: primarily wearing t-shirts/pull over shirts, occasional assist from wife if in a hurry or get stuck, no longer wearing button up shirts anymore.  Wife will assist with donning shirt if completing after shower LB Dressing: difficulty with reaching forward towards feet when donning pants, socks, shoes; increased difficulty when crossing leg , no longer wearing lace up shoes Toileting: requires momentum to stand up from toilet or will reach towards door for leverage/momentum, reports struggle with hygiene  Bathing: occasional assist for thoroughness when drying off   Tub Shower transfers: wife provides supervision, handle is just inside door and pt able to hold on to it while stepping over small ledge Equipment: Grab bars, Walk in shower, and Long handled sponge  IADLs: Spouse completes all shopping, meal prep, and housekeeping tasks. Community mobility: Relies on family or friends for transportation Medication management: pt fills pill box and is able to take correct dosages at correct time Financial management: wife completes and did prior Handwriting:  TBA  MOBILITY STATUS: Hx of falls and difficulty carrying objections with ambulation  POSTURE COMMENTS:  forward head  ACTIVITY TOLERANCE: Activity tolerance: diminished  FUNCTIONAL OUTCOME MEASURES: Physical performance test: PPT#2 (simulated eating) 25.91 & PPT#4 (donning/doffing jacket): TBA  COORDINATION: 9 Hole Peg test: Right: 1:36.03 sec; Left: 58.28 sec  UE ROM:   B shoulder flexion ~120 *, reports mild discomfort in R upper arm with internal rotation but able to achieve full ROM  UE MMT:    grossly 4/5 bilaterally  MUSCLE TONE: RUE: Mild and LUE: Mild  COGNITION: Overall cognitive status:  slower processing and speech  OBSERVATIONS: Bradykinesia   TODAY'S TREATMENT:  DATE:  06/26/22 Large amplitude: engaged in [place] Hands with focus on large amplitude forearm and elbow extension, wrist extension, and hand opening.  Pt completing x10 with min cues and demonstration for increased amplitude. Self-feeding: 24.72 sec with built up handle. Pt reports trying different spoons and forks and utilizing foam handle with improvements when using foam handle.   9 hole peg test: Right: 3:15 (3:03 to place pegs).  Pt reports recognizing that he feels slower in general. Self-care: engaged in conversation about typical routine with toileting and LB dressing.  Pt reports spouse purchasing a reacher but feeling that it is  too long.  OT then providing pt with reacher in clinic to complete simulated LB dressing tasks with pt demonstrating improved ease with 26 reacher.  OT demonstrating use with doffing socks and shoes.  Pt continues to require significantly increased time with toileting hygiene and mobility in the bathroom, however pt verbalizing good routine with safety to access toilet in water  closet.    06/19/23 Coordination: engaged in picking up coins one at a time and placing into coin slot with focus on intentional movements when picking up and pushing through slot.  Pt with increased ease R > L. Self-feeding: engaged in scooping beans from bowl with spoon with focus on quality of movement and adding small tip of spoon away from body to ensure food items remain on spoon while reducing need to utilize other hand to stabilize food onto spoon.  Pt with improved control and decreased spillage when tipping spoon away from body to level out bowl of spoon.  Transitioned to scooping from plate, with pt demonstrating increased use of knife in other hand to push food onto utensil.  OT educated on possible benefit of angled utensil to aid in alignment of spoon to mouth.  Engaged in trial with built up handle with scooping, pt demonstrating improved ease and reports of decreased discomfort of spoon handled in hand.  OT encouraged pt to utilize built up handle with self-feeding.     06/11/23 Pt engaged initially in towel raises with tension applied for overhead raises, torso twists/lateral all for 1x10 each with static hold and tactile cues. Added resistance push/pulls for additional weight bearing and engagement, 1x10 each direction.  Pt engaged in multiple [facility] activities for card flips, cutting card deck, in hand manipulation, etc all with cue for large exaggerated movements. Pt writing 4/4 familiar words/names with approx 60-70% legibility with cues for consistency, pacing, and size.    PATIENT EDUCATION: Education  details: condition specific education, large amplitude exercises Person educated: Patient Education method: Explanation Education comprehension: needs further education  HOME EXERCISE PROGRAM: Access Code: [email or web address] URL: [email or web address] Date: 04/10/2023 Prepared by: [facility] - Outpatient  Rehab - [facility]  Exercises - Seated Shoulder Shrugs  - 2 x daily - 5 reps - Seated Scapular Retraction  - 2 x daily - 5 reps - Seated Finger Flicks with Elbow Extension  - 1 x daily - 2 sets - 5-10 reps - Seated Reaching to Side and Across Body  - 1 x daily - 2 sets - 5-10 reps - Seated Reaching to Side  - 1 x daily - 2 sets - 5-10 reps  GOALS: Goals reviewed with patient? No  SHORT TERM GOALS: Target date: 06/08/23  Pt will be Independent with PD specific HEP. Baseline: Goal status: IN PROGRESS   LONG TERM GOALS: Target date: 06/29/23  Pt will demonstrate increased functional ROM and use of BUE to aid  in clothing management and hygiene with toileting tasks.  Baseline:  Goal status: MET - still requires considerable amount of time, but able to complete without physical assistance  2.  Pt will demonstrate improved ability to get up from lower seat/toilet without assistance with DME PRN. Baseline:  Goal status: MET - pt reports increased time, utilizing rocking/weight shifting to come to standing, reaching towards RW handles  3.  Pt will demonstrate improved fine motor coordination for ADLs as evidenced by decreasing 9 hole peg test score for RUE by 10 secs on R and 1 or fewer drops. Baseline: R: 1:36 and L: 58 sec.  Pt reports dropping small items frequently Goal status: NOT MET - 3:15 on 06/27/23  4.  Pt will demonstrate improved ease with feeding as evidenced by decreasing PPT#2 (self feeding) by 5 secs  Baseline: 25.91 Goal status: NOT MET - 24.72 sec on 06/27/23  5.  Pt will demonstrate and/or report improved independence with LB dressing with use of AE/DME as  needed.  Baseline:  Goal status: PARTIALLY MET - pt reports spouse engineer, manufacturing systems but it is too long  ASSESSMENT:  CLINICAL IMPRESSION: Engaged in lengthy conversation about current status with pt demonstrating some slowing, particularly with coordination tasks and reports continuing to require significantly increased time with aspects of self-care tasks but able to complete with use of AE and without physical assistance.  Pt reports change in meds with today being first day of updated dosage and pt feeling somewhat shaky today (however reports this happened before as well).  Pt has reported improved awareness with self-care tasks and selections of various utensils during self-feeding and other tasks to increase ease and independence, however still demonstrating bradykinesia impacting ease with fine motor control tasks as well as mobility.  Pt has met some STGs and LTGs, but not all.  At this time, patient will be D/C'd to take a break from therapy services, allow him to participate in fitness activities to maintain mobility, and plan to return for evaluation in 6 months d/t progressive nature of his diagnosis. Patient is in agreement with this plan.    PERFORMANCE DEFICITS: in functional skills including ADLs, IADLs, coordination, ROM, strength, flexibility, Fine motor control, Gross motor control, balance, body mechanics, endurance, decreased knowledge of precautions, decreased knowledge of use of DME, and UE functional use and psychosocial skills including coping strategies, environmental adaptation, and routines and behaviors.   IMPAIRMENTS: are limiting patient from ADLs, IADLs, and rest and sleep.     PLAN:  OT FREQUENCY: 1-2x/week  OT DURATION: 6 weeks  PLANNED INTERVENTIONS: 97168 OT Re-evaluation, 97535 self care/ADL training, 02889 therapeutic exercise, 97530 therapeutic activity, 97112 neuromuscular re-education, 97035 ultrasound, 97010 moist heat, 97010 cryotherapy, functional  mobility training, psychosocial skills training, energy conservation, coping strategies training, patient/family education, and DME and/or AE instructions  RECOMMENDED OTHER SERVICES: NA  CONSULTED AND AGREED WITH PLAN OF CARE: Patient  PLAN FOR NEXT SESSION: return eval in 6 months   Nakisha Chai, OTR/L 06/28/2023, 7:46 AM   [facility] [address], [address] [place], [place] [address] Phone # [phone] Fax # [phone]

## 2023-06-29 ENCOUNTER — Telehealth: Payer: Self-pay | Admitting: Neurology

## 2023-06-29 NOTE — Telephone Encounter (Signed)
 Pt's wife called in stating the pt increased his carbidopa -levodopa  and he did PT/OT the other day and his whole body was shaking. Yesterday it happened again, but just his hands and arms were shaking. The only thing she can think of that is different is the medication.

## 2023-06-29 NOTE — Telephone Encounter (Signed)
 Called apteitns wife and let her know wha tPT/OT had to say and

## 2023-07-02 ENCOUNTER — Other Ambulatory Visit (HOSPITAL_COMMUNITY): Payer: Self-pay

## 2023-07-03 ENCOUNTER — Other Ambulatory Visit: Payer: Self-pay

## 2023-07-03 ENCOUNTER — Telehealth: Payer: Self-pay | Admitting: Pharmacy Technician

## 2023-07-03 ENCOUNTER — Ambulatory Visit: Payer: PPO

## 2023-07-03 ENCOUNTER — Other Ambulatory Visit (HOSPITAL_COMMUNITY): Payer: Self-pay

## 2023-07-03 NOTE — Progress Notes (Signed)
07/03/23-CA: Myobloc  Per Sandi, cannot get medication in until the end of feb. can only order 2ml vial. Sent to [facility] to see how we should proceed.

## 2023-07-03 NOTE — Telephone Encounter (Signed)
Pharmacy Patient Advocate Encounter   Received notification from Patient Pharmacy [facility] that [facility] 5000U is temporarily unavailable.   They only one they are able to order to order is 10000u. Please reach out to [facility] for alternative or how you would like to proceed.

## 2023-07-04 ENCOUNTER — Other Ambulatory Visit: Payer: Self-pay

## 2023-07-04 ENCOUNTER — Other Ambulatory Visit (HOSPITAL_COMMUNITY): Payer: Self-pay

## 2023-07-05 ENCOUNTER — Other Ambulatory Visit (HOSPITAL_COMMUNITY): Payer: Self-pay

## 2023-07-05 ENCOUNTER — Ambulatory Visit: Payer: PPO

## 2023-07-13 ENCOUNTER — Ambulatory Visit: Payer: PPO | Admitting: Neurology

## 2023-07-17 ENCOUNTER — Other Ambulatory Visit: Payer: Self-pay

## 2023-07-18 ENCOUNTER — Other Ambulatory Visit (HOSPITAL_COMMUNITY): Payer: Self-pay

## 2023-07-18 ENCOUNTER — Other Ambulatory Visit: Payer: Self-pay

## 2023-07-19 ENCOUNTER — Other Ambulatory Visit: Payer: Self-pay

## 2023-07-20 ENCOUNTER — Other Ambulatory Visit (HOSPITAL_COMMUNITY): Payer: Self-pay

## 2023-07-25 ENCOUNTER — Telehealth: Payer: Self-pay | Admitting: Pharmacy Technician

## 2023-07-25 ENCOUNTER — Other Ambulatory Visit (HOSPITAL_COMMUNITY): Payer: Self-pay

## 2023-07-25 NOTE — Telephone Encounter (Signed)
 Pt called they do not want to do buy and bill they would like to reschedule his appointment front desk has been informed they are going to call the patient and move him

## 2023-07-25 NOTE — Telephone Encounter (Signed)
 Pharmacy Patient Advocate Encounter- MYOBLOC-Pharmacy Benefit:  PA was submitted to [facility] and has been approved through: 7.25.23 TO 12.31.25 Authorization# [id number]  Please send prescription to Specialty Pharmacy: [facility]: [phone]  Estimated Copay is: $250

## 2023-07-25 NOTE — Telephone Encounter (Signed)
 Pharmacy Patient Advocate Encounter- MYOBLOC-Medical Benefit:  Buy/Bill J code: [id number] CPT code: 21308, [id number], [id number] Dx Code: K11.7  PA NOT NOT NEEDED  Authorization# [id number]  Deductible is $3400 Paid out of pocket: $145 20% coinsurance

## 2023-07-26 ENCOUNTER — Other Ambulatory Visit (HOSPITAL_COMMUNITY): Payer: Self-pay

## 2023-07-27 ENCOUNTER — Ambulatory Visit: Payer: PPO | Admitting: Neurology

## 2023-08-20 ENCOUNTER — Other Ambulatory Visit (HOSPITAL_COMMUNITY): Payer: Self-pay

## 2023-08-24 ENCOUNTER — Ambulatory Visit: Admitting: Neurology

## 2023-09-12 DIAGNOSIS — M216X9 Other acquired deformities of unspecified foot: Secondary | ICD-10-CM | POA: Diagnosis not present

## 2023-09-12 DIAGNOSIS — I739 Peripheral vascular disease, unspecified: Secondary | ICD-10-CM | POA: Diagnosis not present

## 2023-09-12 DIAGNOSIS — L97522 Non-pressure chronic ulcer of other part of left foot with fat layer exposed: Secondary | ICD-10-CM | POA: Diagnosis not present

## 2023-09-21 ENCOUNTER — Telehealth: Payer: Self-pay

## 2023-09-21 ENCOUNTER — Other Ambulatory Visit: Payer: Self-pay

## 2023-09-21 DIAGNOSIS — K117 Disturbances of salivary secretion: Secondary | ICD-10-CM

## 2023-09-21 MED ORDER — XEOMIN 100 UNITS IM SOLR
INTRAMUSCULAR | 0 refills | Status: DC
  Filled 2023-09-28: qty 1, 90d supply, fill #0

## 2023-09-21 NOTE — Telephone Encounter (Signed)
 I need a PA for this patient spoke to them today about not being able to order myobloc . They are willing to try Xeomin 100 units for Sialorrhea please

## 2023-09-21 NOTE — Progress Notes (Signed)
 Chelsea from [facility] called to check on status of Myobloc . Per Sandi it is still allocated and unavailable for order at this time. [facility] of this. Per [facility] patient is unsure about switching to Xeomin and she will let us  know of any updates.

## 2023-09-25 ENCOUNTER — Other Ambulatory Visit (HOSPITAL_COMMUNITY): Payer: Self-pay

## 2023-09-25 ENCOUNTER — Telehealth: Payer: Self-pay | Admitting: Pharmacy Technician

## 2023-09-25 NOTE — Telephone Encounter (Signed)
 Pharmacy Patient Advocate Encounter- Injection via Pharmacy Benefit:  PA was submitted  for Xeomin- [id number] to [facility] ADVANTAGE/RX ADVANCE and has been approved through: 5.6.25 TO 12.31.25 Authorization# [id number]  Please send prescription to Specialty Pharmacy: [facility]: [phone]  Estimated Pharmacy Copay is: $250  Patient IS NOT  eligible for Xeomin- [id number] Copay Card, which will make patient's copay as little as zero. Copay card will be provided to pharmacy.   Admin Code: [id number]   Does Not require Prior Auth. Patient's remaining deductible: $0 Patient estimated coinsurance and/or copay for procedure code: $20 COPAY / 0% COINSURANCE CALL REFERENCE # [id number]

## 2023-09-25 NOTE — Telephone Encounter (Signed)
 It is [phone]

## 2023-09-25 NOTE — Telephone Encounter (Signed)
 Pharmacy Patient Advocate Encounter  Pharmacy Benefit PA has been submitted for Xeomin- [id number] via CoverMyMeds.  INSURANCE: [employer]/RX ADVANCE KEY/EOC/FAX: [id number] Procedure code -   PENDING  require a PA Status is Pending

## 2023-09-27 ENCOUNTER — Other Ambulatory Visit: Payer: Self-pay | Admitting: Pharmacy Technician

## 2023-09-28 ENCOUNTER — Other Ambulatory Visit (HOSPITAL_COMMUNITY): Payer: Self-pay

## 2023-09-28 ENCOUNTER — Other Ambulatory Visit: Payer: Self-pay

## 2023-09-28 NOTE — Progress Notes (Signed)
 Specialty Pharmacy Initial Fill Coordination Note  Matthew [name] is a 73 y.o. male contacted today regarding initial fill of specialty medication(s) IncobotulinumtoxinA (Xeomin )   Patient requested Courier to Provider Office   Delivery date: 10/03/23   Verified address: [facility]  [address] [place] [address] [place], [place], [address]   Medication will be filled on 5.13.25.   Patient is aware of $250 copayment.

## 2023-10-02 ENCOUNTER — Other Ambulatory Visit: Payer: Self-pay

## 2023-10-10 DIAGNOSIS — L97522 Non-pressure chronic ulcer of other part of left foot with fat layer exposed: Secondary | ICD-10-CM | POA: Diagnosis not present

## 2023-10-10 DIAGNOSIS — I739 Peripheral vascular disease, unspecified: Secondary | ICD-10-CM | POA: Diagnosis not present

## 2023-10-10 DIAGNOSIS — M216X9 Other acquired deformities of unspecified foot: Secondary | ICD-10-CM | POA: Diagnosis not present

## 2023-10-17 DIAGNOSIS — E7800 Pure hypercholesterolemia, unspecified: Secondary | ICD-10-CM | POA: Diagnosis not present

## 2023-10-17 DIAGNOSIS — Z79899 Other long term (current) drug therapy: Secondary | ICD-10-CM | POA: Diagnosis not present

## 2023-10-17 DIAGNOSIS — R7309 Other abnormal glucose: Secondary | ICD-10-CM | POA: Diagnosis not present

## 2023-10-17 DIAGNOSIS — Z0000 Encounter for general adult medical examination without abnormal findings: Secondary | ICD-10-CM | POA: Diagnosis not present

## 2023-10-19 ENCOUNTER — Ambulatory Visit: Admitting: Neurology

## 2023-10-19 DIAGNOSIS — K117 Disturbances of salivary secretion: Secondary | ICD-10-CM | POA: Diagnosis not present

## 2023-10-19 MED ORDER — INCOBOTULINUMTOXINA 100 UNITS IM SOLR
100.0000 [IU] | INTRAMUSCULAR | Status: AC
  Administered 2023-10-19: 100 [IU] via INTRAMUSCULAR

## 2023-10-19 NOTE — Procedures (Signed)
 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  Consent obtained from: The patient  The patient was educated on the botulinum toxin the black blox warning and given a copy of the botox patient medication guide.  The patient understands that this warning states that there have been reported cases of the Botox extending beyond the injection site and creating adverse effects, similar to those of botulism. This included loss of strength, trouble walking, hoarseness, trouble saying words clearly, loss of bladder control, trouble breathing, trouble swallowing, diplopia, blurry vision and ptosis. Most of the distant spread of Botox was happening in patients, primarily children, who received medication for spasticity or for cervical dystonia. The patient expressed understanding and desire to proceed.   Injections  Location Left  Right Units Number of sites  Parotid (point 1 on picture) 30 30 60 1 per side  Submandibular (point 2) 20 20 40 1 per side  TOTAL UNITS:   100    Type of Toxin: Xeomin  Discarded Units: 0  Needle drawback with each injection was free of blood. Pt tolerated procedure well without complications.   Reinjection is anticipated in 4 months.

## 2023-10-24 DIAGNOSIS — L989 Disorder of the skin and subcutaneous tissue, unspecified: Secondary | ICD-10-CM | POA: Diagnosis not present

## 2023-10-24 DIAGNOSIS — E7800 Pure hypercholesterolemia, unspecified: Secondary | ICD-10-CM | POA: Diagnosis not present

## 2023-10-24 DIAGNOSIS — G20A2 Parkinson's disease without dyskinesia, with fluctuations: Secondary | ICD-10-CM | POA: Diagnosis not present

## 2023-10-24 DIAGNOSIS — Z79899 Other long term (current) drug therapy: Secondary | ICD-10-CM | POA: Diagnosis not present

## 2023-10-24 DIAGNOSIS — Z6825 Body mass index (BMI) 25.0-25.9, adult: Secondary | ICD-10-CM | POA: Diagnosis not present

## 2023-10-24 DIAGNOSIS — R7309 Other abnormal glucose: Secondary | ICD-10-CM | POA: Diagnosis not present

## 2023-10-24 DIAGNOSIS — Z0000 Encounter for general adult medical examination without abnormal findings: Secondary | ICD-10-CM | POA: Diagnosis not present

## 2023-10-24 DIAGNOSIS — M549 Dorsalgia, unspecified: Secondary | ICD-10-CM | POA: Diagnosis not present

## 2023-10-26 ENCOUNTER — Ambulatory Visit: Admitting: Neurology

## 2023-11-13 ENCOUNTER — Telehealth: Payer: Self-pay | Admitting: Neurology

## 2023-11-13 NOTE — Telephone Encounter (Signed)
 Pt wife called to resch 8/5 due to not being in town. Wants to know if he can come sooner than that due to him not doing well. His regular symptoms have gotten worse. Using wheelchair more. He is freezing more. Having more trouble swallowing. Last week he told his wife he could not see straight. Everything seems to be slowing down. Constipated more. She feels she has done everything she is supposed to do.

## 2023-11-15 NOTE — Telephone Encounter (Signed)
 Talked to patients wife he is not doing PT and they are waiting for a CX appointment

## 2023-11-16 NOTE — Progress Notes (Unsigned)
 Assessment/Plan:   1.  Parkinsons Disease  -carbidopa /levodopa  25/100, 2.5 tablets at 9 AM, 2.5 tablets at 11:30 AM, 2.5 tablets 2 PM, 2 tablets at 4:30 PM, 7 PM.    -Continue carbidopa /levodopa  50/200 CR at bedtime.  He goes to bed around 10:30-11pm.    -Continue ropinirole , 1 mg 3 times per day  - Discussed that he needs to make sure that he is continuing with exercise once therapies are completed.  I did make a referral to therapies today.  2.  Sialorrhea  -he's using xeomin  now as Myobloc  is on national backorder.  He thinks that the Xeomin  is helping.  3.  Dysphagia  -Modified barium swallow done December 22, 2021 was unremarkable.  -discussed repeating and he doesn't want to do that right now.  4.  Urinary incontinence  -Following with urology this is not all Parkinson's related.  He has had prostate cancer with radiation  5.  Neurogenic Orthostatic Hypotension  -add midodrine 5 mg tid with meals  - Did ask him to keep a log of blood pressures in various positions Subjective:   Matthew [name] was seen today in follow up for Parkinsons disease.  My previous records were reviewed prior to todays visit as well as outside records available to me.  Wife with patient and supplements hx. he has been worked in today at their request.  I got a call last week that  he is not doing as well.  I asked them if he was attending exercises at [facility], because that was his plan after he finished therapies and he has not been doing that.  He does do some TV exercise with sit and be fit.  He's having trouble with speech.  He's having trouble with constipation.  Using prune juice and miralax .  The xeomin  helped with drooling.  He is getting somewhat lightheaded and dizzy and having trouble getting in/out of bed.  One fall - was trying to get a scrapbook from under the bed and slid under the bed and couldn't get up and son found him with head under bed.  Didn't get hurt as he just slid.  Overall  feeling weak.  His wife cannot tell when levodopa  turns on or off and states that the patient cannot tell either.  He does state, however, that he cannot move well at all in the morning, but perhaps a little bit further into the morning after taking medications, he is able to move better.   Current prescribed movement disorder medications: carbidopa /levodopa  25/100, 2.5 tablets at 9 AM, 2.5 tablets at 11:30 AM, 2.5 tablets 2 PM, 2 tablets at 4:30 PM, 7 PM Carbidopa /levodopa  50/200 CR at bed  Ropinirole , 1 mg 3 times per day   PREVIOUS MEDICATIONS: Requip  (headache, drowsiness, incontinence at only 0.25 mg dose); rotigotine  (feet/leg swelling); pramipexole  (reported feet/ankles swelling at low-dose); selegeline (they thought it caused dizziness); entacapone  (I discontinued when patient complained about sleepiness); inbrija  (tried but didn't think helped);  ALLERGIES:   Allergies  Allergen Reactions   Other     Senior dose of flu shot, cause hear palpitations, anxious   Peanut-Containing Drug Products Other (See Comments)    Nasal congestion    CURRENT MEDICATIONS:  Outpatient Encounter Medications as of 11/19/2023  Medication Sig   Botulinum Toxin Type B (MYOBLOC ) 5000 UNIT/ML SOLN Inject 5000 units into the face and neck every 120 days by the doctor   calcium carbonate (OS-CAL) 1250 (500 Ca) MG chewable tablet Chew 1  tablet by mouth daily. Takes Calcium Citrate 1000 mg daily   carbidopa -levodopa  (SINEMET  CR) 50-200 MG tablet TAKE 1 TABLET BY MOUTH EVERY DAY AT BEDTIME   carbidopa -levodopa  (SINEMET  IR) 25-100 MG tablet 2.5 tablets at 9 AM, 2.5 tablets at 11:30 AM, 2.5 tablets 2 PM, 2 tablets at 4:30 PM and 7 PM.   Coenzyme Q10 (CO Q-10) 100 MG CAPS Take by mouth.   docusate sodium  (COLACE) 100 MG capsule Take 100 mg by mouth daily.   fluticasone  (FLONASE ) 50 MCG/ACT nasal spray Place 1-2 sprays into both nostrils daily as needed for allergies or rhinitis.   Glutathione 500 MG CAPS Take by  mouth.   incobotulinumtoxinA  (XEOMIN ) 100 units SOLR injection Inject 100 units into the head and neck every 90 days   Lithium Orotate 5 MG CAPS Take 1 tablet by mouth 3 (three) times daily as needed.   loratadine  (CLARITIN ) 10 MG tablet Take 10 mg by mouth daily.   NON FORMULARY Take 1 tablet by mouth daily. CDP Choline 500 mg   Omega-3 Fatty Acids (OMEGA 3 500 PO) Take by mouth.   rOPINIRole  (REQUIP ) 1 MG tablet TAKE 1 TABLET BY MOUTH 3 TIMES A DAY   vitamin B-12 (CYANOCOBALAMIN ) 500 MCG tablet Take 1,000 mcg by mouth daily.   Facility-Administered Encounter Medications as of 11/19/2023  Medication   incobotulinumtoxinA  (XEOMIN ) 100 units injection 100 Units    Objective:   PHYSICAL EXAMINATION:    VITALS:   Vitals:   11/19/23 0855  BP: (!) 110/50  Pulse: (!) 44  SpO2: 98%    GEN:  The patient appears stated age and is in NAD.   HEENT:  Normocephalic, atraumatic.  The mucous membranes are moist. The superficial temporal arteries are without ropiness or tenderness.  Has a drool cloth but not really using it CV:  bradycardic.  regular Lungs:  CTAB  Neurological examination:  Orientation: The patient is alert and oriented x3. Cranial nerves: There is good facial symmetry with facial hypomimia. The speech is fluent and mildly dysarthric and intermittently hypophonic.  Soft palate rises symmetrically and there is no tongue deviation. Hearing is intact to conversational tone. Sensation: Sensation is intact to light touch throughout Motor: Strength is at least antigravity x4.  Movement examination: Tone: There is nl tone today Abnormal movements: none Coordination:  there is mild decremation on the L>R Gait and Station: The patient by pushing off.  He arises pretty well today and actually walks fairly well with the walker today.  He is shuffling less.   I have reviewed and interpreted the following labs independently    Chemistry      Component Value Date/Time   NA 135  05/06/2019 0230   K 4.0 05/06/2019 0230   CL 104 05/06/2019 0230   CO2 24 05/06/2019 0230   BUN 22 05/06/2019 0230   CREATININE 0.98 05/06/2019 0230      Component Value Date/Time   CALCIUM 8.7 (L) 05/06/2019 0230       Lab Results  Component Value Date   WBC 9.6 05/06/2019   HGB 9.9 (L) 05/06/2019   HCT 28.9 (L) 05/06/2019   MCV 94.4 05/06/2019   PLT 141 (L) 05/06/2019    No results found for: TSH   Total time spent on today's visit was 43 minutes, including both face-to-face time and nonface-to-face time.  Time included that spent on review of records (prior notes available to me/labs/imaging if pertinent), discussing treatment and goals, answering patient's questions and  coordinating care.  Cc:  Okey Carlin Redbird, MD

## 2023-11-19 ENCOUNTER — Encounter: Payer: Self-pay | Admitting: Neurology

## 2023-11-19 ENCOUNTER — Ambulatory Visit: Admitting: Neurology

## 2023-11-19 VITALS — BP 110/50 | HR 44

## 2023-11-19 DIAGNOSIS — G20A2 Parkinson's disease without dyskinesia, with fluctuations: Secondary | ICD-10-CM

## 2023-11-19 DIAGNOSIS — R498 Other voice and resonance disorders: Secondary | ICD-10-CM | POA: Diagnosis not present

## 2023-11-19 DIAGNOSIS — G903 Multi-system degeneration of the autonomic nervous system: Secondary | ICD-10-CM

## 2023-11-19 MED ORDER — MIDODRINE HCL 5 MG PO TABS: 5.0000 mg | ORAL_TABLET | Freq: Three times a day (TID) | ORAL | 1 refills | Status: AC

## 2023-11-19 NOTE — Patient Instructions (Addendum)
 Increase midodrine 5 mg three times per day with meals Liberalize salt  SAVE THE DATE!  We are planning a Parkinsons Disease educational symposium at [facility], [address] [place], [place], [place] [address] on September 19.  We will have a movement disorder physician expert from [employer] coming to speak and a caregiver speaker.  We will have a panel of experts that will show you who you may need on your team of people on your journey with Parkinsons.  I hope to see you there!  Use this QR code to register by scanning it with the camera app on your phone:      Need more help with registration?  Contact [email or web address]

## 2023-11-28 DIAGNOSIS — Z6825 Body mass index (BMI) 25.0-25.9, adult: Secondary | ICD-10-CM | POA: Diagnosis not present

## 2023-11-28 DIAGNOSIS — R030 Elevated blood-pressure reading, without diagnosis of hypertension: Secondary | ICD-10-CM | POA: Diagnosis not present

## 2023-11-30 ENCOUNTER — Ambulatory Visit: Admitting: Neurology

## 2023-11-30 DIAGNOSIS — C61 Malignant neoplasm of prostate: Secondary | ICD-10-CM | POA: Diagnosis not present

## 2023-11-30 DIAGNOSIS — N3946 Mixed incontinence: Secondary | ICD-10-CM | POA: Diagnosis not present

## 2023-12-11 ENCOUNTER — Other Ambulatory Visit: Payer: Self-pay

## 2023-12-13 ENCOUNTER — Other Ambulatory Visit: Payer: Self-pay

## 2023-12-15 ENCOUNTER — Other Ambulatory Visit: Payer: Self-pay | Admitting: Neurology

## 2023-12-15 DIAGNOSIS — G20A2 Parkinson's disease without dyskinesia, with fluctuations: Secondary | ICD-10-CM

## 2023-12-24 ENCOUNTER — Ambulatory Visit: Payer: Self-pay

## 2023-12-24 ENCOUNTER — Ambulatory Visit: Payer: Self-pay | Admitting: Occupational Therapy

## 2023-12-24 ENCOUNTER — Ambulatory Visit: Payer: PPO

## 2023-12-25 ENCOUNTER — Ambulatory Visit: Payer: PPO | Admitting: Neurology

## 2024-01-04 ENCOUNTER — Telehealth: Payer: Self-pay | Admitting: Neurology

## 2024-01-04 NOTE — Telephone Encounter (Signed)
 Patients wife not wanting to take Midodrine  any longer

## 2024-01-04 NOTE — Telephone Encounter (Signed)
 Called and spoke to patients wife patient had side effect to Midodrine . Patient took two doses and has chills , rash , dizziness , SOB , B/P rose to 200/97. Patient had only taken 2 pills they went to PCP DR. Okey who has been managing patients B/P with compression stockings nad more natural options like additional salt and Dr. Okey is not concerned with low B/P or low Heart rate at this time. Pateitn is also not getting any benefit out of xeomin  and patients wife wanting ot know of any alternatives? I know we had disscussed using samples on some patients I did not know if that is an option for this patient

## 2024-01-04 NOTE — Telephone Encounter (Signed)
 Pt wife wants to talk with chelsea about dougs botox appt. She thought he was to have an appt in August. Dr.Tat put [name] on some medication and he had a bad reaction. She said she rather talk with Mitzie about all this and give her the information

## 2024-01-08 DIAGNOSIS — I70203 Unspecified atherosclerosis of native arteries of extremities, bilateral legs: Secondary | ICD-10-CM | POA: Diagnosis not present

## 2024-01-08 DIAGNOSIS — L84 Corns and callosities: Secondary | ICD-10-CM | POA: Diagnosis not present

## 2024-01-08 DIAGNOSIS — Z872 Personal history of diseases of the skin and subcutaneous tissue: Secondary | ICD-10-CM | POA: Diagnosis not present

## 2024-01-08 DIAGNOSIS — I739 Peripheral vascular disease, unspecified: Secondary | ICD-10-CM | POA: Diagnosis not present

## 2024-01-08 DIAGNOSIS — M216X9 Other acquired deformities of unspecified foot: Secondary | ICD-10-CM | POA: Diagnosis not present

## 2024-01-08 DIAGNOSIS — B351 Tinea unguium: Secondary | ICD-10-CM | POA: Diagnosis not present

## 2024-01-17 DIAGNOSIS — L578 Other skin changes due to chronic exposure to nonionizing radiation: Secondary | ICD-10-CM | POA: Diagnosis not present

## 2024-01-17 DIAGNOSIS — L814 Other melanin hyperpigmentation: Secondary | ICD-10-CM | POA: Diagnosis not present

## 2024-01-17 DIAGNOSIS — L821 Other seborrheic keratosis: Secondary | ICD-10-CM | POA: Diagnosis not present

## 2024-01-17 DIAGNOSIS — D229 Melanocytic nevi, unspecified: Secondary | ICD-10-CM | POA: Diagnosis not present

## 2024-01-17 DIAGNOSIS — D1801 Hemangioma of skin and subcutaneous tissue: Secondary | ICD-10-CM | POA: Diagnosis not present

## 2024-01-23 ENCOUNTER — Ambulatory Visit: Admitting: Physical Therapy

## 2024-01-23 ENCOUNTER — Ambulatory Visit: Attending: Neurology

## 2024-01-23 ENCOUNTER — Ambulatory Visit: Admitting: Occupational Therapy

## 2024-01-23 ENCOUNTER — Other Ambulatory Visit: Payer: Self-pay

## 2024-01-23 ENCOUNTER — Encounter: Payer: Self-pay | Admitting: Physical Therapy

## 2024-01-23 DIAGNOSIS — R29818 Other symptoms and signs involving the nervous system: Secondary | ICD-10-CM | POA: Insufficient documentation

## 2024-01-23 DIAGNOSIS — M6281 Muscle weakness (generalized): Secondary | ICD-10-CM | POA: Diagnosis not present

## 2024-01-23 DIAGNOSIS — R2681 Unsteadiness on feet: Secondary | ICD-10-CM | POA: Diagnosis not present

## 2024-01-23 DIAGNOSIS — R41841 Cognitive communication deficit: Secondary | ICD-10-CM | POA: Insufficient documentation

## 2024-01-23 DIAGNOSIS — R29898 Other symptoms and signs involving the musculoskeletal system: Secondary | ICD-10-CM

## 2024-01-23 DIAGNOSIS — R1310 Dysphagia, unspecified: Secondary | ICD-10-CM | POA: Insufficient documentation

## 2024-01-23 DIAGNOSIS — R278 Other lack of coordination: Secondary | ICD-10-CM | POA: Insufficient documentation

## 2024-01-23 DIAGNOSIS — R498 Other voice and resonance disorders: Secondary | ICD-10-CM | POA: Insufficient documentation

## 2024-01-23 DIAGNOSIS — R293 Abnormal posture: Secondary | ICD-10-CM | POA: Insufficient documentation

## 2024-01-23 DIAGNOSIS — R2689 Other abnormalities of gait and mobility: Secondary | ICD-10-CM | POA: Diagnosis not present

## 2024-01-23 DIAGNOSIS — G20A2 Parkinson's disease without dyskinesia, with fluctuations: Secondary | ICD-10-CM | POA: Diagnosis not present

## 2024-01-23 DIAGNOSIS — R471 Dysarthria and anarthria: Secondary | ICD-10-CM | POA: Insufficient documentation

## 2024-01-23 NOTE — Therapy (Signed)
 OUTPATIENT SPEECH LANGUAGE PATHOLOGY PARKINSON'S EVALUATION   Patient Name: Matthew [name] MRN: [id number] DOB:1951/02/18, 73 y.o., male Today's Date: 01/23/2024  PCP: Okey Dunnings, MD REFERRING PROVIDER: Evonnie Stabs, DO  END OF SESSION:  End of Session - 01/23/24 1557     Visit Number 1    Number of Visits 13    Date for SLP Re-Evaluation 04/04/24   due to start date of ST   SLP Start Time 1402    SLP Stop Time  1445    SLP Time Calculation (min) 43 min    Activity Tolerance Patient tolerated treatment well           Past Medical History:  Diagnosis Date   Anxiety    Arthritis    GERD (gastroesophageal reflux disease)    occ remote history   Hypertension    Parkinson disease (HCC)    Prostate cancer (HCC)    Right knee pain    questionable meniscal tear   Seasonal allergies    Past Surgical History:  Procedure Laterality Date   COLONOSCOPY     Leg Laceration Repair  Left    Age 37   PELVIC LYMPH NODE DISSECTION Bilateral 03/22/2018   Procedure: PELVIC LYMPH NODE DISSECTION;  Surgeon: Devere Lonni Righter, MD;  Location: [facility];  Service: Urology;  Laterality: Bilateral;   PROSTATE BIOPSY     ROBOT ASSISTED LAPAROSCOPIC RADICAL PROSTATECTOMY N/A 03/22/2018   Procedure: XI ROBOTIC ASSISTED LAPAROSCOPIC PROSTATECTOMY;  Surgeon: Devere Lonni Righter, MD;  Location: [facility];  Service: Urology;  Laterality: N/A;   TOTAL KNEE ARTHROPLASTY Right 05/05/2019   Procedure: RIGHT TOTAL KNEE ARTHROPLASTY;  Surgeon: Liam Lerner, MD;  Location: [facility];  Service: Orthopedics;  Laterality: Right;   Patient Active Problem List   Diagnosis Date Noted   S/P TKR (total knee replacement), right 05/05/2019   Osteoarthritis of right knee 05/02/2019   Prostate cancer (HCC) 03/22/2018   Malignant neoplasm of prostate (HCC) 02/27/2018    ONSET DATE: script dated 11/19/23  REFERRING DIAG: Parkinson's Disease  THERAPY DIAG:  Dysarthria and anarthria  Cognitive  communication deficit  Dysphagia, unspecified type  Rationale for Evaluation and Treatment: Rehabilitation  SUBJECTIVE:   SUBJECTIVE STATEMENT: When I do my loud /a/ Lupita it gets Advertising copywriter.  Pt accompanied by: significant other [name]  PERTINENT HISTORY: Pt well-known to this SLP from previous courses of ST. MBS in August 2023- results below. Pt's last ST course resulted in pt partially meeting goal for producing speech in upper 60s dB in short conversational segments.  PAIN:  Are you having pain? No  FALLS: Has patient fallen in last 6 months?  See PT evaluation for details  LIVING ENVIRONMENT: Lives with: lives with their family Lives in: House/apartment  PLOF:  Level of assistance: Needed assistance with ADLs, Needed assistance with IADLS  Employment: Retired  PATIENT GOALS: Improve loudness and swallowing  OBJECTIVE:   DIAGNOSTIC FINDINGS:  MBS 12-22-21 Patient presents with functional oropharyngeal swallowing without aspiration of any consistency tested. He does demonstrate minimal asymptomatic laryngeal penetration of thin liquid due to decreased timing of laryngeal closure. Chin tuck posture with use of straw effective to prevent penetration. Pharyngeal swallow is strong without retention. Oral transiting discoordination noted with pt swallowing barium tablet as he required 2nd bolus of thin to transit through oropharynx. Upon esophageal sweep, pt appeared with barium tablet retained in esophagus with pt awareness. Tablet appeared to transit distally after several boluses of liquids. After tablet had transited,  pt continued with "phantom" sensation to esophagus. Recommend pt continue diet as tolerated, using chin tuck with liquids if improves comfort, decreases cough. Also encouraged pt to assure maintains strength of voice, cough and expectoration for airway protection.   COGNITION: Overall cognitive status: Impaired Areas of impairment: Following commands and Problem  solving and slower processing resulting in more difficulty with verbal expression   MOTOR SPEECH: Overall motor speech: impaired Level of impairment: Word Respiration: thoracic breathing and clavicular breathing Phonation: breathy and low vocal intensity (see below for measurements) Resonance: WFL Articulation: Appears intact Intelligibility: Intelligible Effective technique: increased vocal intensity (See below)  ORAL MOTOR EXAMINATION: Overall status: Impaired:   Labial: Bilateral (ROM and Coordination) Lingual: Bilateral (ROM and Coordination) Facial: Bilateral (ROM) Comments: better ROM with visual and verbal cues  OBJECTIVE VOICE ASSESSMENT: Sustained loud ah maximum phonation time: 12.1 seconds Sustained loud ah loudness average: 86 dB Conversational loudness average: 66 dB Conversational loudness range: 58-74 dB Voice quality: breathy and low vocal intensity Stimulability trials: Given SLP modeling and rare min cues progressing to occasional mod, loudness average increased to average 68dB (range of 60 to 72); As stated, SLP req'd to use incr'd frequency cueing and more intense cueing as time progressed over 90 seconds of of simple conversation to mitigate volume decay to average 67 dB. Cognitive load min-moderately negatively impacted pt's ability to achieve WNL loudness in conversation.  Pt does report difficulty with swallowing and incr'd difficulty with drooling which would warrant MBS at this time. Pt's last MBS was October 2023.    PATIENT REPORTED OUTCOME MEASURES (PROM): Communication Effectiveness Survey: to be completed in first 2 sessions  TODAY'S TREATMENT:                                                                                                                                         DATE:  01/23/24: After evaluation, pt, SLP, and wife discussed pt's options for therapy at this time, with SLP collaborating with pt and wife what SLP thinks would be most  profitable for pt given his input, wife's input, and the deficits highlighted with the evaluation completed today. A MBS is recommended.   PATIENT EDUCATION: Education details: see today's treatment Person educated: Patient and Spouse Education method: Explanation and Demonstration Education comprehension: verbalized understanding  HOME EXERCISE PROGRAM: TBD    GOALS: Goals reviewed with patient? No  SHORT TERM GOALS: Target date: 03/21/24-given date of first ST  Produce /a/ with average upper 80s dB over three sessions Baseline: Goal status: INITIAL  2.  pt will generate 3 minutes simple conversation with upper 60s dB average given occasional min A in 3 sessions  Baseline:  Goal status: INITIAL  3.  pt will improve verbal expression to a functional volume when cued by [name] in 3 sessions  Baseline:  Goal status: INITIAL  4.  If necessary, pt will use strategies provided  during MBS with written cues in 3 sessions Baseline:  Goal status: INITIAL   LONG TERM GOALS: Target date: 04/04/24  pt will produce average upper 60s dB in 5 minutes simple conversation in 3 sessions  Baseline:  Goal status: INITIAL  2.   pt will improve verbal expression to a functional volume when cued by [name] in 3 sessions, after 03/21/24 Baseline:  Goal status: INITIAL  3.  pt will report fewer requests to repeat himself than prior to ST in 3 sessions Baseline:  Goal status: INITIAL  4.   Pt will use strategies for safer swallowing in 3 sessions with modified independence  Baseline:  Goal status: INITIAL  5.  Pt will score higher/better on PROM in the last 2 weeks of therapy Baseline:  Goal status: INITIAL   ASSESSMENT:  CLINICAL IMPRESSION: Patient is a 73 y.o. male who was seen today for assessment of dysarthria and in light of stage V Parkinson's Disease. Pt tells SLP that family members ask him to repeat frequently, his frequency of communication in social situations has decr'd.  Pt was able to improve loudness in simple conversation for 90 seconds with cues from SLP and thus is a good candidate for ST.  Pt would benefit from MBS, one will be suggested to referring MD.  OBJECTIVE IMPAIRMENTS: Objective impairments include executive functioning, aphasia, dysarthria, and dysphagia. These impairments are limiting patient from household responsibilities, ADLs/IADLs, effectively communicating at home and in community, and safety when swallowing.Factors affecting potential to achieve goals and functional outcome are none.. Patient will benefit from skilled SLP services to address above impairments and improve overall function.  REHAB POTENTIAL: Good  PLAN:  SLP FREQUENCY: 1-2x/week  SLP DURATION: 6 weeks  PLANNED INTERVENTIONS: Aspiration precaution training, Diet toleration management , Environmental controls, Cueing hierachy, Cognitive reorganization, Internal/external aids, Oral motor exercises, Functional tasks, Multimodal communication approach, SLP instruction and feedback, Compensatory strategies, and Patient/family education    [facility], CCC-SLP 01/23/2024, 4:17 PM

## 2024-01-23 NOTE — Therapy (Incomplete)
 OUTPATIENT PHYSICAL THERAPY NEURO EVALUATION   Patient Name: Matthew [name] MRN: [id number] DOB:1951/02/19, 73 y.o., male Today's Date: 01/24/2024   PCP: Okey Carlin Redbird, MD REFERRING PROVIDER: Tat, Asberry RAMAN, DO   END OF SESSION:  PT End of Session - 01/24/24 1515     Visit Number 1    Number of Visits 10    Date for PT Re-Evaluation 03/07/24    Authorization Type HTA    Progress Note Due on Visit 10    PT Start Time 1450    PT Stop Time 1535    PT Time Calculation (min) 45 min    Equipment Utilized During Treatment Gait belt    Activity Tolerance Patient tolerated treatment well    Behavior During Therapy WFL for tasks assessed/performed          Past Medical History:  Diagnosis Date   Anxiety    Arthritis    GERD (gastroesophageal reflux disease)    occ remote history   Hypertension    Parkinson disease (HCC)    Prostate cancer (HCC)    Right knee pain    questionable meniscal tear   Seasonal allergies    Past Surgical History:  Procedure Laterality Date   COLONOSCOPY     Leg Laceration Repair  Left    Age 27   PELVIC LYMPH NODE DISSECTION Bilateral 03/22/2018   Procedure: PELVIC LYMPH NODE DISSECTION;  Surgeon: Devere Lonni Righter, MD;  Location: [facility];  Service: Urology;  Laterality: Bilateral;   PROSTATE BIOPSY     ROBOT ASSISTED LAPAROSCOPIC RADICAL PROSTATECTOMY N/A 03/22/2018   Procedure: XI ROBOTIC ASSISTED LAPAROSCOPIC PROSTATECTOMY;  Surgeon: Devere Lonni Righter, MD;  Location: [facility];  Service: Urology;  Laterality: N/A;   TOTAL KNEE ARTHROPLASTY Right 05/05/2019   Procedure: RIGHT TOTAL KNEE ARTHROPLASTY;  Surgeon: Liam Lerner, MD;  Location: [facility];  Service: Orthopedics;  Laterality: Right;   Patient Active Problem List   Diagnosis Date Noted   S/P TKR (total knee replacement), right 05/05/2019   Osteoarthritis of right knee 05/02/2019   Prostate cancer (HCC) 03/22/2018   Malignant neoplasm of prostate (HCC) 02/27/2018     ONSET DATE: MD referral 11/19/2023  REFERRING DIAG:  G20.A2 (ICD-10-CM) - Parkinson's disease without dyskinesia, with fluctuating manifestations (HCC)  R49.8 (ICD-10-CM) - Hypophonia    THERAPY DIAG:  Muscle weakness (generalized)  Unsteadiness on feet  Other abnormalities of gait and mobility  Abnormal posture  Other symptoms and signs involving the musculoskeletal system  Rationale for Evaluation and Treatment: Rehabilitation  SUBJECTIVE:  SUBJECTIVE STATEMENT: Wife reports BP measures have leveled out due to family MD taking care of that.  I feel like I'm getting worse instead of better.  Not sure I do the exercises right.  My arms just don't go as high as they used to.  My posture is bad-hurts to straighten up and I lean to the left. Pt accompanied by: significant other  PERTINENT HISTORY: Parkinson's, R TKR, prostate Ca, HTN  PAIN:  Are you having pain? No and pain increases with movement 1-5/10 with shoulders and back  PRECAUTIONS: Fall and Other: hx of neurogenic orthostatic hypotension  RED FLAGS: None   WEIGHT BEARING RESTRICTIONS: No  FALLS: Has patient fallen in last 6 months? Several falls; less falls since hospital bed; may 1 fall every 1-2 months Feels like he may lose balance backwards and wife provides very close supervision  LIVING ENVIRONMENT: Lives with: lives with their family Lives in: House/apartment Stairs: 2-3 steps to enter Has following equipment at home: Vannie - 2 wheeled, Environmental consultant - 4 wheeled, Wheelchair (manual), and hospital bed and manual w/c and transport w/c; grab bars in shower. BSC, lift chair.  Has a lower extremity pedaler at home (since knee surgery)  PLOF: Needs assistance with gait, Needs assistance with transfers, and Leisure: does voice  exercises; does [employer]! Moves exercises (about 15-20 min/day), dance class online, chair ex on [employer] (Sit and Be Fit)  PATIENT GOALS: To get a better exercise routine compared to what I'm doing now.  To try to figure out what he can do for exercise and posture.  OBJECTIVE:  Note: Objective measures were completed at Evaluation unless otherwise noted.  DIAGNOSTIC FINDINGS: NA for this episode  COGNITION: Overall cognitive status: Within functional limits for tasks assessed and slowed response time   POSTURE: rounded shoulders, forward head, posterior pelvic tilt, flexed trunk , and weight shift left  LOWER EXTREMITY ROM:     Active  Right Eval Left Eval  Hip flexion    Hip extension    Hip abduction    Hip adduction    Hip internal rotation    Hip external rotation    Knee flexion    Knee extension -35 -40  Ankle dorsiflexion    Ankle plantarflexion    Ankle inversion    Ankle eversion     (Blank rows = not tested)  LOWER EXTREMITY MMT:    MMT Right Eval Left Eval  Hip flexion 4 4  Hip extension    Hip abduction 4 4  Hip adduction 4 4  Hip internal rotation    Hip external rotation    Knee flexion 4 4  Knee extension 4 4  Ankle dorsiflexion 4 4  Ankle plantarflexion    Ankle inversion    Ankle eversion    (Blank rows = not tested)  BED MOBILITY:  Not tested  TRANSFERS: Sit to stand: CGA  Assistive device utilized: None     Stand to sit: CGA  Assistive device utilized: hands to reach to chair      GAIT: Findings: Gait Characteristics: step to pattern, decreased step length- Right, decreased step length- Left, knee flexed in stance- Right, knee flexed in stance- Left, shuffling, festinating, lateral lean- Left, poor foot clearance- Right, and poor foot clearance- Left, Distance walked: 40 ft, Assistive device utilized:Walker - 4 wheeled, Level of assistance: CGA and Min A, and Comments: shuffling/ festination increased with turns   FUNCTIONAL TESTS:  5 times  sit to stand: 24.07 sec  Timed up and go (TUG): 43.84 sec 10 meter walk test: 22.87 sec in 25 ft = 1.09 ft/sec Berg:  26/56 (Scores <45/56 indicate increased fall risk)  [facility] PT Assessment - 01/23/24 1522       Standardized Balance Assessment   Standardized Balance Assessment Berg Balance Test      Berg Balance Test   Sit to Stand Able to stand  independently using hands    Standing Unsupported Able to stand 2 minutes with supervision    Sitting with Back Unsupported but Feet Supported on Floor or Stool Able to sit 2 minutes under supervision    Stand to Sit Controls descent by using hands    Transfers Able to transfer safely, definite need of hands    Standing Unsupported with Eyes Closed Able to stand 10 seconds with supervision    Standing Unsupported with Feet Together Able to place feet together independently and stand for 1 minute with supervision    From Standing, Reach Forward with Outstretched Arm Reaches forward but needs supervision    From Standing Position, Pick up Object from Floor Unable to pick up and needs supervision    From Standing Position, Turn to Look Behind Over each Shoulder Needs supervision when turning    Turn 360 Degrees Needs assistance while turning   15 sec   Standing Unsupported, Alternately Place Feet on Step/Stool Needs assistance to keep from falling or unable to try    Standing Unsupported, One Foot in Front Able to take small step independently and hold 30 seconds    Standing on One Leg Unable to try or needs assist to prevent fall    Total Score 26    Berg comment: <45/56 indicates increased fall risk                                                                                                                                       TREATMENT DATE: 01/23/2024    PATIENT EDUCATION: Education details: Eval results, POC; discussed benefits of OPPT versus HHPT Person educated: Patient and Spouse Education method: Explanation Education  comprehension: verbalized understanding  HOME EXERCISE PROGRAM: Not yet initiated  GOALS: Goals reviewed with patient? Yes  SHORT TERM GOALS: Target date: 02/15/2024  Pt will be supervision with HEP for improved strength, flexibility/decreased pain, balance. Baseline: Goal status: INITIAL  2.  Pt will improve TUG score to less than or equal to 35 sec for decreased fall risk.  Baseline:  43 sec  Goal status:  INITIAL   LONG TERM GOALS: Target date: 03/07/2024  Pt will be supervision with progression of HEP for improved strength, balance, transfers. Baseline:  Goal status: INITIAL  2.  Pt will improve 5x sit<>stand to less than or equal to 18 sec to demonstrate improved functional strength and transfer efficiency.  Baseline: 24 sec Goal status: INITIAL  3.  Pt will improve gait velocity to at  least 1.8 ft/sec for improved gait efficiency and safety. Baseline: 1.07 ft/sec Goal status: INITIAL  4.  Pt will improve Berg score to at least 38/56 to decrease fall risk. Baseline: 26/56 Goal status: INITIAL  5.  Pt will verbalize understanding of fall prevention in home environment.  Baseline:  Goal status: INITIAL  ASSESSMENT:  CLINICAL IMPRESSION: Patient is a 73 y.o. male who was seen today for physical therapy evaluation and treatment for Parkinson's disease. He and wife present to OPP[facility] for evaluation upon referral from Dr. Evonnie.  He has not had any falls, but pt and wife report increased difficulty in overall mobility.  He presents with abnormal posture, postural instability, decreased functional strength, decreased flexibility, bradykinesia, rigidity, pain, decreased balance, decreased timing/coordination/independence with gait.  He is at increased fall risk per Berg, FTSTS, TUG, gt velocity scores.  He is exercising at home; not currently exercising in the community.  He would benefit from skilled PT to address the above stated deficits to decrease fall risk, improve overall  functional mobility, and decrease caregiver burden.  OBJECTIVE IMPAIRMENTS: Abnormal gait, decreased balance, decreased mobility, difficulty walking, decreased ROM, decreased strength, impaired flexibility, postural dysfunction, and pain.   ACTIVITY LIMITATIONS: standing, transfers, bed mobility, bathing, toileting, dressing, self feeding, reach over head, hygiene/grooming, and locomotion level  PARTICIPATION LIMITATIONS: meal prep, cleaning, laundry, shopping, community activity, and community fitness  PERSONAL FACTORS: 3+ comorbidities: see above are also affecting patient's functional outcome.   REHAB POTENTIAL: Good  CLINICAL DECISION MAKING: Evolving/moderate complexity  EVALUATION COMPLEXITY: Moderate  PLAN:  PT FREQUENCY: 2x/wk for 3 weeks, then 1x/wk for 3 weeks (pt having to wait to come in until after dental procedure)  PT DURATION: 6 weeks plus eval  PLANNED INTERVENTIONS: 97750- Physical Performance Testing, 97110-Therapeutic exercises, 97530- Therapeutic activity, 97112- Neuromuscular re-education, 97535- Self Care, 02859- Manual therapy, [phone]- Gait training, Patient/Family education, and Balance training  PLAN FOR NEXT SESSION: Initiate HEP (review what pt is currently doing to see if we need to take any old exercises away); work on flexibility, sit to stand, gait technique, postural reeducation   STARLET GREIG ORN., PT 01/24/2024, 3:17 PM  [facility] at [facility] [address], [address] [place], [place] [address] Phone # [phone] Fax # [phone]

## 2024-01-23 NOTE — Therapy (Signed)
 OUTPATIENT OCCUPATIONAL THERAPY PARKINSON'S EVALUATION  Patient Name: Matthew [name] MRN: [id number] DOB:11/30/1950, 73 y.o., male Today's Date: 01/24/2024  PCP: Okey Carlin Redbird, MD REFERRING PROVIDER: Tat, Asberry RAMAN, DO  END OF SESSION:  OT End of Session - 01/23/24 1638     Visit Number 1    Number of Visits 9    Date for OT Re-Evaluation 03/19/24    Authorization Type [employer]    OT Start Time 1318    OT Stop Time 1408    OT Time Calculation (min) 50 min          Past Medical History:  Diagnosis Date   Anxiety    Arthritis    GERD (gastroesophageal reflux disease)    occ remote history   Hypertension    Parkinson disease (HCC)    Prostate cancer (HCC)    Right knee pain    questionable meniscal tear   Seasonal allergies    Past Surgical History:  Procedure Laterality Date   COLONOSCOPY     Leg Laceration Repair  Left    Age 39   PELVIC LYMPH NODE DISSECTION Bilateral 03/22/2018   Procedure: PELVIC LYMPH NODE DISSECTION;  Surgeon: Devere Lonni Righter, MD;  Location: [facility];  Service: Urology;  Laterality: Bilateral;   PROSTATE BIOPSY     ROBOT ASSISTED LAPAROSCOPIC RADICAL PROSTATECTOMY N/A 03/22/2018   Procedure: XI ROBOTIC ASSISTED LAPAROSCOPIC PROSTATECTOMY;  Surgeon: Devere Lonni Righter, MD;  Location: [facility];  Service: Urology;  Laterality: N/A;   TOTAL KNEE ARTHROPLASTY Right 05/05/2019   Procedure: RIGHT TOTAL KNEE ARTHROPLASTY;  Surgeon: Liam Lerner, MD;  Location: [facility];  Service: Orthopedics;  Laterality: Right;   Patient Active Problem List   Diagnosis Date Noted   S/P TKR (total knee replacement), right 05/05/2019   Osteoarthritis of right knee 05/02/2019   Prostate cancer (HCC) 03/22/2018   Malignant neoplasm of prostate (HCC) 02/27/2018    ONSET DATE: referral date 11/19/23  REFERRING DIAG: G20.A2 (ICD-10-CM) - Parkinson's disease without dyskinesia, with fluctuating manifestations (HCC) R49.8 (ICD-10-CM) -  Hypophonia  THERAPY DIAG:  Other symptoms and signs involving the nervous system  Abnormal posture  Muscle weakness (generalized)  Unsteadiness on feet  Other lack of coordination  Rationale for Evaluation and Treatment: Rehabilitation  SUBJECTIVE:   SUBJECTIVE STATEMENT: I find that I can't do anything on my own anymore.  Pt reports needing assistance with getting dressed, going to the bathroom, taking a shower, getting in/out of bed, eating.  Pt reports difficulty with coordination and motor to bring food to the mouth and then difficulty with chewing and swallowing.  Pt's spouse has started cutting foods and he will eat more finger foods, but still making a mess  Pt's spouse reports that he has been sleeping in a hospital bed for about 6 months now, however still with difficulty getting in and out of bed.    Pt's spouse reports that he tries to do his exercises daily and still does some online chair exercises.    Pt accompanied by: self and significant other (spouse)  PERTINENT HISTORY: PD, anxiety, arthritis, GERD, HTN, R TKR   PRECAUTIONS: Fall  WEIGHT BEARING RESTRICTIONS: No  PAIN:  Are you having pain? No  FALLS: Has patient fallen in last 6 months? Yes. Number of falls when he falls I can't get him up. A few falls; Has not fallen in the last 2 months  LIVING ENVIRONMENT: Lives with: lives with their spouse Lives in: House/apartment Stairs: 1  step down to screened porch with small portable ramp, 3 steps at the garage with 1 rail and wall Has following equipment at home: hospital bed, 4 wheeled walker, wheelchair, grab bars in the shower, BSC next to the shower for ease of transfers and dressing, arms on toilet, trapeze   PLOF: Requires assistive device for independence and Needs assistance with ADLs  PATIENT GOALS: strategies to increase ease with bed mobility and eating  OBJECTIVE:  Note: Objective measures were completed at Evaluation unless otherwise  noted.  HAND DOMINANCE: Right  ADLs: Overall ADLs: Spouse is assisting with all aspects of bathing and dressing.  Reports that it will take upwards of an hour for bathing and dressing.   Transfers/ambulation related to ADLs: Increased use of wheelchair in the home. Still with use of 4 wheeled walker intermittently.  Pt with tendency to veer to the left with ambulation Eating: wife cuts up food, gives a lot of finger foods.  Pt has tried a variety of adaptive utensils with minimal impact Grooming: utilizing electric toothbrush and water  pick for oral care, difficulty with brushing hair  UB Dressing: wife assisting for time LB Dressing: wife assisting due to decreased reach towards floor and for time Toileting: requires momentum to stand up from toilet or will reach towards door for leverage/momentum, reports struggle with hygiene.  Increased time spent on the toilet due to constipation Bathing: wife assisting for time Tub Shower transfers: wife provides supervision, handle is just inside door and pt able to hold on to it while stepping over small ledge  Equipment: Grab bars, Walk in shower, and Long handled sponge  IADLs: Spouse completes all shopping, meal prep, and housekeeping tasks. Community mobility: Relies on family or friends for transportation Medication management: pt fills pill box and is able to take correct dosages at correct time Financial management: wife completes and did prior  MOBILITY STATUS: increased use of wheelchair in the home and in the community, veers to Left with ambulation with 4 wheeled walker  POSTURE COMMENTS:  rounded shoulders, forward head, and weight shift left  ACTIVITY TOLERANCE: Activity tolerance: diminished  FUNCTIONAL OUTCOME MEASURES: Physical performance test: PPT#2 (simulated eating) 37.82 & PPT#4 (donning/doffing jacket): TBD    COORDINATION: Not assessed  UE ROM:   L shoulder flexion 105, abduction 60, pronation 70        R  shoulder flexion 100, abduction 90, pronation 80   UE MMT:   grossly 3+/5   MUSCLE TONE: RUE: Mild and LUE: Mild  COGNITION: Overall cognitive status: Within functional limits for tasks assessed and slower processing and speech  OBSERVATIONS: Bradykinesia; Pt requiring min assist for anterior weight shift for sit > stand.  Pt veering to L while he was walking back to treatment room.                                                                                                                    TREATMENT DATE:  01/23/24 Self-feeding: educated on use of angled  spoon to increase ease of getting bowl of spoon to mouth.  OT providing pictures and suggestions for trial of angled spoon as pt with good mobility with scooping this session, however decreased coordination with rotating spoon to mouth without spilling.  Pt's spouse to look into either purchasing angled spoon or bending one. LB dressing: educated on trialing shorter reacher to aid in LB dressing.  OT demonstrating technique with advancing gait belt over feet and then pulling up to knees with reacher.  Pt with increased ability to advance gait belt over R foot with use of reacher.  Pt's spouse to look into shorter reacher (as he has a long one). Educated on potential benefit from [facility] services as pt's spouse reports that he fatigues more quickly and has questions about whether he can tolerate all 3 therapies in a row.  Pt also requires increased time and assistance with getting in and out of the car.  Discussed [facility] social worker as well as PD/movement disorder Child psychotherapist for additional services in the home and for continuum of care.  Pt and spouse to discuss and decide if feasible to attend OP therapies or if they want to pursue home health.   PATIENT EDUCATION: Education details: AE/adaptive strategies for LB dressing, self-feeding; and education on HH option Person educated: Patient and Spouse Education method: Explanation and Verbal  cues Education comprehension: verbalized understanding and needs further education  HOME EXERCISE PROGRAM: TBD  GOALS: Goals reviewed with patient? Yes  SHORT TERM GOALS: Target date: 02/20/24  Pt will be independent in PD specific HEP. Baseline: Goal status: INITIAL  2.  Pt and spouse will report understanding of adaptive equipment and/or adaptive strategies to increase ease with ADLs/IADLs. Baseline:  Goal status: INITIAL  LONG TERM GOALS: Target date: 03/19/24  Pt will demonstrate improved ease with feeding as evidenced by decreasing PPT#2 (self feeding) by 5 secs with use of AE PRN. Baseline: 37.82 Goal status: INITIAL  2.  Pt will demonstrate increased B shoulder ROM to 120 or greater to increase ease with UB dressing. Baseline:  Goal status: INITIAL  3.  Pt with demonstrate improved ease with bed mobility with use of AE and/or adaptive strategies PRN. Baseline:  Goal status: INITIAL  4.  Patient will report at least two-point increase in average PSFS score or at least three-point increase in a single activity score indicating functionally significant improvement given minimum detectable change. Baseline: 2.7 Goal status: INITIAL  ASSESSMENT:  CLINICAL IMPRESSION: Patient is a 73 y.o. male who was seen today for occupational therapy evaluation for progression of PD, with pt requiring increased physical assistance and increased time with ADLs.  Pt and spouse seeking strategies and potential assistance as pt requiring increased assistance.  Pt and spouse's biggest concerns for OT are his ability to eat, assist in dressing, and bed mobility.  Engaged in discussion about OP vs HH therapies and what would be best fit for pt and caregiver at this time.  Pt and spouse initially most interested in OP therapies but plan to think about it and make a decision by the end of the week, as getting out of the house is challenging for pt.  Pt will benefit from skilled occupational therapy  services to address ROM, strength, balance, introduction of compensatory strategies/AE prn, and implementation of an HEP to improve participation and safety during ADLs and improve quality of life.     PERFORMANCE DEFICITS: in functional skills including ADLs, coordination, ROM, strength, flexibility, Fine motor control,  Gross motor control, balance, body mechanics, endurance, decreased knowledge of precautions, decreased knowledge of use of DME, and UE functional use and psychosocial skills including environmental adaptation and routines and behaviors.   IMPAIRMENTS: are limiting patient from ADLs, leisure, and social participation.   COMORBIDITIES:  may have co-morbidities  that affects occupational performance. Patient will benefit from skilled OT to address above impairments and improve overall function.  MODIFICATION OR ASSISTANCE TO COMPLETE EVALUATION: Min-Moderate modification of tasks or assist with assess necessary to complete an evaluation.  OT OCCUPATIONAL PROFILE AND HISTORY: Detailed assessment: Review of records and additional review of physical, cognitive, psychosocial history related to current functional performance.  CLINICAL DECISION MAKING: Moderate - several treatment options, min-mod task modification necessary  REHAB POTENTIAL: Good  EVALUATION COMPLEXITY: Moderate    PLAN:  OT FREQUENCY: 1-2x/week  OT DURATION: 8 weeks  PLANNED INTERVENTIONS: 97168 OT Re-evaluation, 97535 self care/ADL training, 02889 therapeutic exercise, 97530 therapeutic activity, 97112 neuromuscular re-education, 97035 ultrasound, functional mobility training, psychosocial skills training, energy conservation, coping strategies training, patient/family education, and DME and/or AE instructions  RECOMMENDED OTHER SERVICES: NA  CONSULTED AND AGREED WITH PLAN OF CARE: Patient and family member/caregiver  PLAN FOR NEXT SESSION: LB dressing with reacher, bed mobility, self-feeding with angled  spoon, UB ROM/large amplitude   Dervin Vore, OTR/L 01/24/2024, 8:28 AM   [facility] at [facility] [address], [address] [place], [place] [address] Phone # [phone] Fax # [phone]

## 2024-01-24 NOTE — Addendum Note (Signed)
 Addended by: STARLET GREIG ORN on: 01/24/2024 05:02 PM   Modules accepted: Orders

## 2024-01-24 NOTE — Therapy (Signed)
 [facility] [address] [place], [place], [address] Phone: [phone]   Fax:  [phone]  Patient Details  Name: Matthew [name] MRN: [id number] Date of Birth: 1950-06-12 Referring Provider:  Evonnie Stabs, DO  Encounter Date: 01/24/2024  SPEECH THERAPY DISCHARGE SUMMARY  Visits from Start of Care: 16  Current functional level related to goals / functional outcomes: Pt's last appointment for his previous therapy course was 27 June 2023. Pt's last therapy note, goals, and Impression from that visit are below. Pt did not arrive to his last scheduled appointment. 06/27/23: PT req'd total A to use chin tuck. When he did so he did not exhibit s/sx pharyngeal dysphagia.  Pt saw Dr. Evonnie yesterday (see s). Eleven reps of loud /a/ completed with average 90dB with rare min A for loudness. He forgot sentences today, so SLP transitioned to practical speaking situations to work with louder speech. In simple conversation of 8 minutes pt averaged 68 dB. After this time pt's volume decr'd to average 64dB and pt req'd mod cues usually for remaining loud in the next 5 minutes. SLP asked pt questions requiring sentence responses and he req'd mod A occasionally for loudness. SLP felt if pt completed loud /a/ this may assist in muscle memory so SLP had pt complete 2 loud /a/ and pt average volume incr'd to average 67 for 3 1/2 minutes and then decr'd again to average 64dB despite usual mod cues from SLP. I'm tired today, Lupita, pt stated at that time.  Pt completed PT and OT courses today - gave pt option of cancelling next week's ST appointments but made pt know SLP was also happy to finish up next week without PT/OT. Pt to let SLP know his desire.  SHORT TERM GOALS: Target date: 04/20/23   Produce /a/ with average mid-upper 80s dB over three sessions Baseline: 04/12/23 Goal status: Partially met   2.  pt will demo abdominal breathing with  sentence responses 85% accuracy x 3 sessions  Baseline:  Goal status: not addressed yet   3.  pt will generate 2 minutes simple conversation with upper 60s dB average in 2 sessions  Baseline: 04/17/23 Goal status: Partially met   4.  Pt will use strategies for dysfluency in 1-2 minute short conversational segments  Baseline:  Goal status: deferred - dysfluency not heard in sessions thus far     LONG TERM GOALS: Target date: 05/20/23, 06/29/23   pt will produce average upper 60s dB in 10 minutes simple to mod complex conversation in 5 sessions  Baseline: 06/19/23, 06/22/23 Goal status: continue   2.  pt will maintain upper 60s dB average in 10 minute simple conversation x 3 sessions  Baseline: 04/26/23, 06/14/23 Goal status: met   3.  pt will use abdominal breathing >80% of the time during 10 minute simple conversation in 3 sessions  Baseline: 04/26/23, 06/14/23 Goal status: met   4.  pt will report fewer requests to repeat himself than prior to ST in 3 sessions  Baseline: 06/19/23 Goal status: continue   5.  Pt will use strategies for safer swallowing in 3 sessions with modified independence  Baseline: 06/19/23 Goal status: continue   6.  Pt will score higher/better on PROM in the last 2 weeks of therapy Baseline:  Goal status: deferred - will perform on last visit ASSESSMENT:   CLINICAL IMPRESSION: Patient is a 73 y.o. male who was seen today for treatment of dysarthria, and  of swallowing in light of Parkinsons Disease. See today's treatment for more details. Pt told SLP on eval date that family members ask him to repeat frequently, and he frequency of communicating in social situations has decr'd since last session of previous therapy course.   Remaining deficits: Pt with deficits in dysarthria, cognitive communication, and dysphagia.   Education / Equipment: See therapy notes for details.    Patient agrees to discharge. Patient goals were partially met. Patient is being  discharged due to not returning since the last visit.SABRA     Zhoey Blackstock, CCC-SLP 01/24/2024, 1:22 PM  [facility] [address], [address] [place], [place], [address] Phone: [phone]   Fax:  [phone]

## 2024-02-11 ENCOUNTER — Ambulatory Visit: Admitting: Occupational Therapy

## 2024-02-11 ENCOUNTER — Encounter: Payer: Self-pay | Admitting: Physical Therapy

## 2024-02-11 ENCOUNTER — Ambulatory Visit: Admitting: Physical Therapy

## 2024-02-11 DIAGNOSIS — R29818 Other symptoms and signs involving the nervous system: Secondary | ICD-10-CM

## 2024-02-11 DIAGNOSIS — M6281 Muscle weakness (generalized): Secondary | ICD-10-CM

## 2024-02-11 DIAGNOSIS — R2689 Other abnormalities of gait and mobility: Secondary | ICD-10-CM

## 2024-02-11 DIAGNOSIS — R2681 Unsteadiness on feet: Secondary | ICD-10-CM

## 2024-02-11 DIAGNOSIS — R471 Dysarthria and anarthria: Secondary | ICD-10-CM | POA: Diagnosis not present

## 2024-02-11 DIAGNOSIS — R278 Other lack of coordination: Secondary | ICD-10-CM

## 2024-02-11 NOTE — Therapy (Signed)
 OUTPATIENT OCCUPATIONAL THERAPY PARKINSON'S  Treatment Note  Patient Name: Matthew [name] MRN: [id number] DOB:29-Dec-1950, 73 y.o., male Today's Date: 02/11/2024  PCP: Okey Carlin Redbird, MD REFERRING PROVIDER: Tat, Asberry RAMAN, DO  END OF SESSION:  OT End of Session - 02/11/24 1406     Visit Number 2    Number of Visits 9    Date for Recertification  03/19/24    Authorization Type [employer]    OT Start Time 1400    OT Stop Time 1442    OT Time Calculation (min) 42 min           Past Medical History:  Diagnosis Date   Anxiety    Arthritis    GERD (gastroesophageal reflux disease)    occ remote history   Hypertension    Parkinson disease (HCC)    Prostate cancer (HCC)    Right knee pain    questionable meniscal tear   Seasonal allergies    Past Surgical History:  Procedure Laterality Date   COLONOSCOPY     Leg Laceration Repair  Left    Age 24   PELVIC LYMPH NODE DISSECTION Bilateral 03/22/2018   Procedure: PELVIC LYMPH NODE DISSECTION;  Surgeon: Devere Lonni Righter, MD;  Location: [facility];  Service: Urology;  Laterality: Bilateral;   PROSTATE BIOPSY     ROBOT ASSISTED LAPAROSCOPIC RADICAL PROSTATECTOMY N/A 03/22/2018   Procedure: XI ROBOTIC ASSISTED LAPAROSCOPIC PROSTATECTOMY;  Surgeon: Devere Lonni Righter, MD;  Location: [facility];  Service: Urology;  Laterality: N/A;   TOTAL KNEE ARTHROPLASTY Right 05/05/2019   Procedure: RIGHT TOTAL KNEE ARTHROPLASTY;  Surgeon: Liam Lerner, MD;  Location: [facility];  Service: Orthopedics;  Laterality: Right;   Patient Active Problem List   Diagnosis Date Noted   S/P TKR (total knee replacement), right 05/05/2019   Osteoarthritis of right knee 05/02/2019   Prostate cancer (HCC) 03/22/2018   Malignant neoplasm of prostate (HCC) 02/27/2018    ONSET DATE: referral date 11/19/23  REFERRING DIAG: G20.A2 (ICD-10-CM) - Parkinson's disease without dyskinesia, with fluctuating manifestations (HCC) R49.8 (ICD-10-CM) -  Hypophonia  THERAPY DIAG:  Other symptoms and signs involving the nervous system  Muscle weakness (generalized)  Unsteadiness on feet  Other lack of coordination  Rationale for Evaluation and Treatment: Rehabilitation  SUBJECTIVE:   SUBJECTIVE STATEMENT: Pt reports dental work done a few weeks back and the suture should be all dissolved.    Pt accompanied by: self and significant other (spouse)  PERTINENT HISTORY: PD, anxiety, arthritis, GERD, HTN, R TKR   PRECAUTIONS: Fall  WEIGHT BEARING RESTRICTIONS: No  PAIN:  Are you having pain? No  FALLS: Has patient fallen in last 6 months? Yes. Number of falls when he falls I can't get him up. A few falls; Has not fallen in the last 2 months  LIVING ENVIRONMENT: Lives with: lives with their spouse Lives in: House/apartment Stairs: 1 step down to screened porch with small portable ramp, 3 steps at the garage with 1 rail and wall Has following equipment at home: hospital bed, 4 wheeled walker, wheelchair, grab bars in the shower, BSC next to the shower for ease of transfers and dressing, arms on toilet, trapeze   PLOF: Requires assistive device for independence and Needs assistance with ADLs  PATIENT GOALS: strategies to increase ease with bed mobility and eating  OBJECTIVE:  Note: Objective measures were completed at Evaluation unless otherwise noted.  HAND DOMINANCE: Right  ADLs: Overall ADLs: Spouse is assisting with all aspects of bathing  and dressing.  Reports that it will take upwards of an hour for bathing and dressing.   Transfers/ambulation related to ADLs: Increased use of wheelchair in the home. Still with use of 4 wheeled walker intermittently.  Pt with tendency to veer to the left with ambulation Eating: wife cuts up food, gives a lot of finger foods.  Pt has tried a variety of adaptive utensils with minimal impact Grooming: utilizing electric toothbrush and water  pick for oral care, difficulty with brushing  hair  UB Dressing: wife assisting for time LB Dressing: wife assisting due to decreased reach towards floor and for time Toileting: requires momentum to stand up from toilet or will reach towards door for leverage/momentum, reports struggle with hygiene.  Increased time spent on the toilet due to constipation Bathing: wife assisting for time Tub Shower transfers: wife provides supervision, handle is just inside door and pt able to hold on to it while stepping over small ledge  Equipment: Grab bars, Walk in shower, and Long handled sponge  IADLs: Spouse completes all shopping, meal prep, and housekeeping tasks. Community mobility: Relies on family or friends for transportation Medication management: pt fills pill box and is able to take correct dosages at correct time Financial management: wife completes and did prior  MOBILITY STATUS: increased use of wheelchair in the home and in the community, veers to Left with ambulation with 4 wheeled walker  POSTURE COMMENTS:  rounded shoulders, forward head, and weight shift left  ACTIVITY TOLERANCE: Activity tolerance: diminished  FUNCTIONAL OUTCOME MEASURES: Physical performance test: PPT#2 (simulated eating) 37.82 & PPT#4 (donning/doffing jacket): TBD    COORDINATION: Not assessed  UE ROM:   L shoulder flexion 105, abduction 60, pronation 70        R shoulder flexion 100, abduction 90, pronation 80   UE MMT:   grossly 3+/5   MUSCLE TONE: RUE: Mild and LUE: Mild  COGNITION: Overall cognitive status: Within functional limits for tasks assessed and slower processing and speech  OBSERVATIONS: Bradykinesia; Pt requiring min assist for anterior weight shift for sit > stand.  Pt veering to L while he was walking back to treatment room.                                                                                                                    TREATMENT DATE:  02/11/24 Medication: pt requiring increased time to retrieve water   bottle from slot on Rollator, OT recommending a taller water  bottle that may allow for increased ease of reaching.  Pt opening and retrieving meds from plastic bag without dropping or spilling.  Pt with increased time when sipping water .  OT educated on various styles of pill boxes to aid in both daily and times when he is out at appts. Large amplitude: engaged in modified LSVT exercises with focus on reaching forwards, down to floor, overhead, and out to side.  OT providing demonstration and cues for amplitude and foot placement prior to activity for increased BOS.  Pt with  most difficulty with overhead and out to side reaching.   Bag exercises: transitioned to simulated ADLS for the following activities (using a plastic bag/scarf): donning shirt, pulling down shirt, and donning pants.  OT downgrading task to lifting BUE overhead and advancing bag under thigh to facilitate increased ROM in achievable manner. Sit > stand: pt required mod assist from elevated surface for sit > stand at end of session due to fatigue and difficulty with sit > stand.   01/23/24 Self-feeding: educated on use of angled spoon to increase ease of getting bowl of spoon to mouth.  OT providing pictures and suggestions for trial of angled spoon as pt with good mobility with scooping this session, however decreased coordination with rotating spoon to mouth without spilling.  Pt's spouse to look into either purchasing angled spoon or bending one. LB dressing: educated on trialing shorter reacher to aid in LB dressing.  OT demonstrating technique with advancing gait belt over feet and then pulling up to knees with reacher.  Pt with increased ability to advance gait belt over R foot with use of reacher.  Pt's spouse to look into shorter reacher (as he has a long one). Educated on potential benefit from [facility] services as pt's spouse reports that he fatigues more quickly and has questions about whether he can tolerate all 3 therapies in a row.   Pt also requires increased time and assistance with getting in and out of the car.  Discussed [facility] social worker as well as PD/movement disorder Child psychotherapist for additional services in the home and for continuum of care.  Pt and spouse to discuss and decide if feasible to attend OP therapies or if they want to pursue home health.   PATIENT EDUCATION: Education details: ROM and large amplitude to carry over to ADLs Person educated: Patient and Spouse Education method: Explanation and Verbal cues Education comprehension: verbalized understanding and needs further education  HOME EXERCISE PROGRAM: TBD  GOALS: Goals reviewed with patient? Yes  SHORT TERM GOALS: Target date: 02/20/24  Pt will be independent in PD specific HEP. Baseline: Goal status: in progress  2.  Pt and spouse will report understanding of adaptive equipment and/or adaptive strategies to increase ease with ADLs/IADLs. Baseline:  Goal status: in progress  LONG TERM GOALS: Target date: 03/19/24  Pt will demonstrate improved ease with feeding as evidenced by decreasing PPT#2 (self feeding) by 5 secs with use of AE PRN. Baseline: 37.82 Goal status: in progress  2.  Pt will demonstrate increased B shoulder ROM to 120 or greater to increase ease with UB dressing. Baseline:  Goal status: in progress  3.  Pt with demonstrate improved ease with bed mobility with use of AE and/or adaptive strategies PRN. Baseline:  Goal status: in progress  4.  Patient will report at least two-point increase in average PSFS score or at least three-point increase in a single activity score indicating functionally significant improvement given minimum detectable change. Baseline: 2.7 Goal status: in progress  ASSESSMENT:  CLINICAL IMPRESSION: Patient is a 73 y.o. male who was seen today for occupational therapy treatment for progression of PD, with pt requiring increased physical assistance and increased time with ADLs.  Pt  demonstrating overall slowing with functional tasks of opening water  bottle and taking medication.  Pt benefiting from cues for postural setup and amplitude during LSVT activities and bag exercises.  Pt more fatigued this session, spouse reporting that one of the presenters at the PD Symposium on Friday recommended Melatonin  for sleep but finds that pt is more sluggish since taking for 2 nights.  Pt will continue to benefit from skilled occupational therapy services to address ROM, strength, balance, introduction of compensatory strategies/AE prn, and implementation of an HEP to improve participation and safety during ADLs and improve quality of life.     PERFORMANCE DEFICITS: in functional skills including ADLs, coordination, ROM, strength, flexibility, Fine motor control, Gross motor control, balance, body mechanics, endurance, decreased knowledge of precautions, decreased knowledge of use of DME, and UE functional use and psychosocial skills including environmental adaptation and routines and behaviors.   IMPAIRMENTS: are limiting patient from ADLs, leisure, and social participation.    PLAN:  OT FREQUENCY: 1-2x/week  OT DURATION: 8 weeks  PLANNED INTERVENTIONS: 97168 OT Re-evaluation, 97535 self care/ADL training, 02889 therapeutic exercise, 97530 therapeutic activity, 97112 neuromuscular re-education, 97035 ultrasound, functional mobility training, psychosocial skills training, energy conservation, coping strategies training, patient/family education, and DME and/or AE instructions  RECOMMENDED OTHER SERVICES: NA  CONSULTED AND AGREED WITH PLAN OF CARE: Patient and family member/caregiver  PLAN FOR NEXT SESSION: LB dressing with reacher, bed mobility, self-feeding with angled spoon, UB ROM/large amplitude   Hadyn Blanck, OTR/L 02/11/2024, 3:16 PM   [facility] [address] [place], [place] [address] Phone # [phone] Fax #  [phone]

## 2024-02-11 NOTE — Therapy (Signed)
 OUTPATIENT PHYSICAL THERAPY NEURO TREATMENT NOTE   Patient Name: Matthew [name] MRN: [id number] DOB:1951/02/20, 73 y.o., male Today's Date: 02/11/2024   PCP: Okey Carlin Redbird, MD REFERRING PROVIDER: Tat, Asberry RAMAN, DO   END OF SESSION:  PT End of Session - 02/11/24 1319     Visit Number 2    Number of Visits 10    Date for Recertification  03/07/24    Authorization Type HTA    Progress Note Due on Visit 10    PT Start Time 1317    PT Stop Time 1359    PT Time Calculation (min) 42 min    Equipment Utilized During Treatment Gait belt    Activity Tolerance Patient tolerated treatment well    Behavior During Therapy WFL for tasks assessed/performed           Past Medical History:  Diagnosis Date   Anxiety    Arthritis    GERD (gastroesophageal reflux disease)    occ remote history   Hypertension    Parkinson disease (HCC)    Prostate cancer (HCC)    Right knee pain    questionable meniscal tear   Seasonal allergies    Past Surgical History:  Procedure Laterality Date   COLONOSCOPY     Leg Laceration Repair  Left    Age 77   PELVIC LYMPH NODE DISSECTION Bilateral 03/22/2018   Procedure: PELVIC LYMPH NODE DISSECTION;  Surgeon: Devere Lonni Righter, MD;  Location: [facility];  Service: Urology;  Laterality: Bilateral;   PROSTATE BIOPSY     ROBOT ASSISTED LAPAROSCOPIC RADICAL PROSTATECTOMY N/A 03/22/2018   Procedure: XI ROBOTIC ASSISTED LAPAROSCOPIC PROSTATECTOMY;  Surgeon: Devere Lonni Righter, MD;  Location: [facility];  Service: Urology;  Laterality: N/A;   TOTAL KNEE ARTHROPLASTY Right 05/05/2019   Procedure: RIGHT TOTAL KNEE ARTHROPLASTY;  Surgeon: Liam Lerner, MD;  Location: [facility];  Service: Orthopedics;  Laterality: Right;   Patient Active Problem List   Diagnosis Date Noted   S/P TKR (total knee replacement), right 05/05/2019   Osteoarthritis of right knee 05/02/2019   Prostate cancer (HCC) 03/22/2018   Malignant neoplasm of prostate (HCC) 02/27/2018     ONSET DATE: MD referral 11/19/2023  REFERRING DIAG:  G20.A2 (ICD-10-CM) - Parkinson's disease without dyskinesia, with fluctuating manifestations (HCC)  R49.8 (ICD-10-CM) - Hypophonia    THERAPY DIAG:  Unsteadiness on feet  Other abnormalities of gait and mobility  Muscle weakness (generalized)  Rationale for Evaluation and Treatment: Rehabilitation  SUBJECTIVE:  SUBJECTIVE STATEMENT: Have been taking melatonin after my wife went to symposium on Friday.  Feel like it may be helping me sleep better, but I'm groggy today. Pt accompanied by: significant other  PERTINENT HISTORY: Parkinson's, R TKR, prostate Ca, HTN  PAIN:  Are you having pain? No and pain increases with movement 1-5/10 with shoulders and back  PRECAUTIONS: Fall and Other: hx of neurogenic orthostatic hypotension  RED FLAGS: None   WEIGHT BEARING RESTRICTIONS: No  FALLS: Has patient fallen in last 6 months? Several falls; less falls since hospital bed; may 1 fall every 1-2 months Feels like he may lose balance backwards and wife provides very close supervision  LIVING ENVIRONMENT: Lives with: lives with their family Lives in: House/apartment Stairs: 2-3 steps to enter Has following equipment at home: Vannie - 2 wheeled, Environmental consultant - 4 wheeled, Wheelchair (manual), and hospital bed and manual w/c and transport w/c; grab bars in shower. BSC, lift chair.  Has a lower extremity pedaler at home (since knee surgery)  PLOF: Needs assistance with gait, Needs assistance with transfers, and Leisure: does voice exercises; does PWR! Moves exercises (about 15-20 min/day), dance class online, chair ex on [employer] (Sit and Be Fit)  PATIENT GOALS: To get a better exercise routine compared to what I'm doing now.  To try to figure out what he  can do for exercise and posture.  OBJECTIVE:   Has 15# kettle bell at home, has 0 degree knee stretch  TODAY'S TREATMENT: 02/11/2024 Activity Comments  Review of pt's current HEP: PWR! Hands Seated heel/toe raises Seated and Standing PWR! Moves Sit to stand    Does standing occasionally  Seated PRE strengthening: Marching 2 x 10  3#  Seated hamstring stretch, 3 x 30 sec each leg Foot propped on 8 block  Seated forward lean with therapy ball to upright sitting (scapular squeezes) Seated forward/diagonal lean for lateral trunk stretch 5 reps  Standing squat to upright stand 10 reps Quad and glut set  Forward heel taps, alternating 10 reps at locked walker Forward/back walking with 4WW, 20 ft x 2 reps    Access Code: [email or web address] URL: [email or web address] Date: 02/11/2024 Prepared by: [facility] - Outpatient  Rehab - [facility]  Exercises - Seated Flexion Stretch with Swiss Ball  - 1-2 x daily - 7 x weekly - 1 sets - 5 reps - Seated Thoracic Flexion and Rotation with Swiss Ball  - 1-2 x daily - 7 x weekly - 1 sets - 5 reps - Seated Hamstring Stretch  - 2 x daily - 7 x weekly - 1 sets - 3 reps  PATIENT EDUCATION: Education details: HEP initiated Person educated: Patient Education method: Explanation, Demonstration, and Handouts Education comprehension: verbalized understanding and returned demonstration  ----------------------------------------------------- Note: Objective measures were completed at Evaluation unless otherwise noted.  DIAGNOSTIC FINDINGS: NA for this episode  COGNITION: Overall cognitive status: Within functional limits for tasks assessed and slowed response time   POSTURE: rounded shoulders, forward head, posterior pelvic tilt, flexed trunk , and weight shift left  LOWER EXTREMITY ROM:     Active  Right Eval Left Eval  Hip flexion    Hip extension    Hip abduction    Hip adduction    Hip internal rotation    Hip external  rotation    Knee flexion    Knee extension -35 -40  Ankle dorsiflexion    Ankle plantarflexion    Ankle inversion    Ankle eversion     (  Blank rows = not tested)  LOWER EXTREMITY MMT:    MMT Right Eval Left Eval  Hip flexion 4 4  Hip extension    Hip abduction 4 4  Hip adduction 4 4  Hip internal rotation    Hip external rotation    Knee flexion 4 4  Knee extension 4 4  Ankle dorsiflexion 4 4  Ankle plantarflexion    Ankle inversion    Ankle eversion    (Blank rows = not tested)  BED MOBILITY:  Not tested  TRANSFERS: Sit to stand: CGA  Assistive device utilized: None     Stand to sit: CGA  Assistive device utilized: hands to reach to chair      GAIT: Findings: Gait Characteristics: step to pattern, decreased step length- Right, decreased step length- Left, knee flexed in stance- Right, knee flexed in stance- Left, shuffling, festinating, lateral lean- Left, poor foot clearance- Right, and poor foot clearance- Left, Distance walked: 40 ft, Assistive device utilized:Walker - 4 wheeled, Level of assistance: CGA and Min A, and Comments: shuffling/ festination increased with turns   FUNCTIONAL TESTS:  5 times sit to stand: 24.07 sec Timed up and go (TUG): 43.84 sec 10 meter walk test: 22.87 sec in 25 ft = 1.09 ft/sec Berg:  26/56 (Scores <45/56 indicate increased fall risk)                                                                                                                                 TREATMENT DATE: 01/23/2024    PATIENT EDUCATION: Education details: Eval results, POC; discussed benefits of OPPT versus HHPT Person educated: Patient and Spouse Education method: Explanation Education comprehension: verbalized understanding  HOME EXERCISE PROGRAM: Not yet initiated  GOALS: Goals reviewed with patient? Yes  SHORT TERM GOALS: Target date: 02/15/2024  Pt will be supervision with HEP for improved strength, flexibility/decreased pain,  balance. Baseline: Goal status: INITIAL  2.  Pt will improve TUG score to less than or equal to 35 sec for decreased fall risk.  Baseline:  43 sec  Goal status:  INITIAL   LONG TERM GOALS: Target date: 03/07/2024  Pt will be supervision with progression of HEP for improved strength, balance, transfers. Baseline:  Goal status: INITIAL  2.  Pt will improve 5x sit<>stand to less than or equal to 18 sec to demonstrate improved functional strength and transfer efficiency.  Baseline: 24 sec Goal status: INITIAL  3.  Pt will improve gait velocity to at least 1.8 ft/sec for improved gait efficiency and safety. Baseline: 1.07 ft/sec Goal status: INITIAL  4.  Pt will improve Berg score to at least 38/56 to decrease fall risk. Baseline: 26/56 Goal status: INITIAL  5.  Pt will verbalize understanding of fall prevention in home environment.  Baseline:  Goal status: INITIAL  ASSESSMENT:  CLINICAL IMPRESSION: Pt presents today with no new complaints. Skilled PT session focused on seated low back and lower  extremity flexibility as well as leg strengthening activities. Pt needs assist to initiate flexibility exercises and strengthening work.  He does not have any complaints at end of session. Pt will continue to benefit from skilled PT towards goals for improved functional mobility and decreased fall risk.   OBJECTIVE IMPAIRMENTS: Abnormal gait, decreased balance, decreased mobility, difficulty walking, decreased ROM, decreased strength, impaired flexibility, postural dysfunction, and pain.   ACTIVITY LIMITATIONS: standing, transfers, bed mobility, bathing, toileting, dressing, self feeding, reach over head, hygiene/grooming, and locomotion level  PARTICIPATION LIMITATIONS: meal prep, cleaning, laundry, shopping, community activity, and community fitness  PERSONAL FACTORS: 3+ comorbidities: see above are also affecting patient's functional outcome.   REHAB POTENTIAL: Good  CLINICAL  DECISION MAKING: Evolving/moderate complexity  EVALUATION COMPLEXITY: Moderate  PLAN:  PT FREQUENCY: 2x/wk for 3 weeks, then 1x/wk for 3 weeks (pt having to wait to come in until after dental procedure)  PT DURATION: 6 weeks plus eval  PLANNED INTERVENTIONS: 97750- Physical Performance Testing, 97110-Therapeutic exercises, 97530- Therapeutic activity, 97112- Neuromuscular re-education, 97535- Self Care, 02859- Manual therapy, [id number]- Gait training, Patient/Family education, and Balance training  PLAN FOR NEXT SESSION: Consider SAQ and supine trunk/hamstring flexibility (will need to work on bed mobility too); review HEPstarted 9/22 and progress.  *STGS versus renew and change POC dates (since pt had dental work and has not come since eval)  Work on flexibility, sit to stand, gait technique, postural reeducation   STARLET GREIG ORN., PT 02/11/2024, 2:05 PM  [facility] at [facility] [address] [place], [address] [place], [place] [address] Phone # [phone] Fax # [phone]

## 2024-02-11 NOTE — Patient Instructions (Signed)
 Bag Exercises:  Small trash bag or produce bag works best.  For all exercises, sit with big posture (sit up tall with head up) and use big movements. Perform the following exercises 1-2 times per day.  Hold bag in one hand. Stretch both arms/hands out to the side as big as you can. Then, pass bag from one hand to the other IN FRONT of you. Stretch arms back out big after each pass. Repeat 10 times. Hold bag in one hand. Stretch both arms/hands out to the side as big as you can. Then, pass bag from one hand to the other BEHIND you. Stretch arms back out big after each pass. Repeat 10 times. Hold bag in both hands in front of you with hands/arms shoulder length apart. Move bag behind your head. Repeat 10 times. Hold bag in both hands in front of you with hands/arms shoulder length apart. Lift leg and move bag completely under thigh and back (eventually progressing to moving bag under each foot and back). Repeat 10 times on each side.

## 2024-02-13 ENCOUNTER — Ambulatory Visit: Admitting: Occupational Therapy

## 2024-02-13 ENCOUNTER — Ambulatory Visit: Admitting: Physical Therapy

## 2024-02-13 DIAGNOSIS — R29818 Other symptoms and signs involving the nervous system: Secondary | ICD-10-CM

## 2024-02-13 DIAGNOSIS — R2681 Unsteadiness on feet: Secondary | ICD-10-CM

## 2024-02-13 DIAGNOSIS — R471 Dysarthria and anarthria: Secondary | ICD-10-CM | POA: Diagnosis not present

## 2024-02-13 DIAGNOSIS — M6281 Muscle weakness (generalized): Secondary | ICD-10-CM

## 2024-02-13 DIAGNOSIS — R293 Abnormal posture: Secondary | ICD-10-CM

## 2024-02-13 DIAGNOSIS — R2689 Other abnormalities of gait and mobility: Secondary | ICD-10-CM

## 2024-02-13 DIAGNOSIS — R278 Other lack of coordination: Secondary | ICD-10-CM

## 2024-02-13 NOTE — Therapy (Signed)
 OUTPATIENT PHYSICAL THERAPY NEURO TREATMENT NOTE   Patient Name: Matthew [name] MRN: [id number] DOB:December 08, 1950, 73 y.o., male Today's Date: 02/14/2024   PCP: Okey Carlin Redbird, MD REFERRING PROVIDER: Tat, Asberry RAMAN, DO   END OF SESSION:  PT End of Session - 02/14/24 1738     Visit Number 3    Number of Visits 10    Date for Recertification  03/07/24    Authorization Type HTA    Progress Note Due on Visit 10    PT Start Time 1534    PT Stop Time 1614    PT Time Calculation (min) 40 min    Equipment Utilized During Treatment Gait belt    Activity Tolerance Patient tolerated treatment well    Behavior During Therapy WFL for tasks assessed/performed            Past Medical History:  Diagnosis Date   Anxiety    Arthritis    GERD (gastroesophageal reflux disease)    occ remote history   Hypertension    Parkinson disease (HCC)    Prostate cancer (HCC)    Right knee pain    questionable meniscal tear   Seasonal allergies    Past Surgical History:  Procedure Laterality Date   COLONOSCOPY     Leg Laceration Repair  Left    Age 49   PELVIC LYMPH NODE DISSECTION Bilateral 03/22/2018   Procedure: PELVIC LYMPH NODE DISSECTION;  Surgeon: Devere Lonni Righter, MD;  Location: [facility];  Service: Urology;  Laterality: Bilateral;   PROSTATE BIOPSY     ROBOT ASSISTED LAPAROSCOPIC RADICAL PROSTATECTOMY N/A 03/22/2018   Procedure: XI ROBOTIC ASSISTED LAPAROSCOPIC PROSTATECTOMY;  Surgeon: Devere Lonni Righter, MD;  Location: [facility];  Service: Urology;  Laterality: N/A;   TOTAL KNEE ARTHROPLASTY Right 05/05/2019   Procedure: RIGHT TOTAL KNEE ARTHROPLASTY;  Surgeon: Liam Lerner, MD;  Location: [facility];  Service: Orthopedics;  Laterality: Right;   Patient Active Problem List   Diagnosis Date Noted   S/P TKR (total knee replacement), right 05/05/2019   Osteoarthritis of right knee 05/02/2019   Prostate cancer (HCC) 03/22/2018   Malignant neoplasm of prostate (HCC)  02/27/2018    ONSET DATE: MD referral 11/19/2023  REFERRING DIAG:  G20.A2 (ICD-10-CM) - Parkinson's disease without dyskinesia, with fluctuating manifestations (HCC)  R49.8 (ICD-10-CM) - Hypophonia    THERAPY DIAG:  Unsteadiness on feet  Other abnormalities of gait and mobility  Muscle weakness (generalized)  Other symptoms and signs involving the nervous system  Rationale for Evaluation and Treatment: Rehabilitation  SUBJECTIVE:  SUBJECTIVE STATEMENT: Is there really anything new when you have PD? Pt accompanied by: significant other  PERTINENT HISTORY: Parkinson's, R TKR, prostate Ca, HTN  PAIN:  Are you having pain? No and pain increases with movement 1-5/10 with shoulders and back  PRECAUTIONS: Fall and Other: hx of neurogenic orthostatic hypotension  RED FLAGS: None   WEIGHT BEARING RESTRICTIONS: No  FALLS: Has patient fallen in last 6 months? Several falls; less falls since hospital bed; may 1 fall every 1-2 months Feels like he may lose balance backwards and wife provides very close supervision  LIVING ENVIRONMENT: Lives with: lives with their family Lives in: House/apartment Stairs: 2-3 steps to enter Has following equipment at home: Vannie - 2 wheeled, Environmental consultant - 4 wheeled, Wheelchair (manual), and hospital bed and manual w/c and transport w/c; grab bars in shower. BSC, lift chair.  Has a lower extremity pedaler at home (since knee surgery)  PLOF: Needs assistance with gait, Needs assistance with transfers, and Leisure: does voice exercises; does PWR! Moves exercises (about 15-20 min/day), dance class online, chair ex on [employer] (Sit and Be Fit)  PATIENT GOALS: To get a better exercise routine compared to what I'm doing now.  To try to figure out what he can do for exercise and  posture.  OBJECTIVE:    TODAY'S TREATMENT: 02/13/2024 Activity Comments  Seated forward lean with therapy ball to upright sitting (scapular squeezes) Seated forward/diagonal lean for lateral trunk stretch 5 reps   3 reps each leg Good review  Reviewed hamstring stretch Good form, still limited with stretch  Seated march, seated heel taps with straight knee-cues for large amplitude movement 2 x 5-10 reps for activation, seated aerobic warm up  Standing modified quadruped-at elevated mat: PWR! Up 2 x 5 reps PWR! [name] 2 x 5 reps PWR! Twist 2 x 5 reps PWR! Step 2 x 5 reps   Visual, verbal, tactile cues  Stand x 5-10 sec no device Sit to stand from chair, 3 reps   Gait with pt's 4WW 85 ft Decreased foot clearance; cues for slowed pace to keep 4WW close for longer strides    Access Code: [email or web address] URL: [email or web address] Date: 02/11/2024 Prepared by: [facility] - Outpatient  Rehab - [facility]  Exercises - Seated Flexion Stretch with Swiss Ball  - 1-2 x daily - 7 x weekly - 1 sets - 5 reps - Seated Thoracic Flexion and Rotation with Swiss Ball  - 1-2 x daily - 7 x weekly - 1 sets - 5 reps - Seated Hamstring Stretch  - 2 x daily - 7 x weekly - 1 sets - 3 reps  PATIENT EDUCATION: Education details: Continue current HEP Person educated: Patient Education method: Explanation, Demonstration, and Handouts Education comprehension: verbalized understanding and returned demonstration  ----------------------------------------------------- Note: Objective measures were completed at Evaluation unless otherwise noted.  DIAGNOSTIC FINDINGS: NA for this episode  COGNITION: Overall cognitive status: Within functional limits for tasks assessed and slowed response time   POSTURE: rounded shoulders, forward head, posterior pelvic tilt, flexed trunk , and weight shift left  LOWER EXTREMITY ROM:     Active  Right Eval Left Eval  Hip flexion    Hip extension    Hip  abduction    Hip adduction    Hip internal rotation    Hip external rotation    Knee flexion    Knee extension -35 -40  Ankle dorsiflexion    Ankle plantarflexion    Ankle inversion  Ankle eversion     (Blank rows = not tested)  LOWER EXTREMITY MMT:    MMT Right Eval Left Eval  Hip flexion 4 4  Hip extension    Hip abduction 4 4  Hip adduction 4 4  Hip internal rotation    Hip external rotation    Knee flexion 4 4  Knee extension 4 4  Ankle dorsiflexion 4 4  Ankle plantarflexion    Ankle inversion    Ankle eversion    (Blank rows = not tested)  BED MOBILITY:  Not tested  TRANSFERS: Sit to stand: CGA  Assistive device utilized: None     Stand to sit: CGA  Assistive device utilized: hands to reach to chair      GAIT: Findings: Gait Characteristics: step to pattern, decreased step length- Right, decreased step length- Left, knee flexed in stance- Right, knee flexed in stance- Left, shuffling, festinating, lateral lean- Left, poor foot clearance- Right, and poor foot clearance- Left, Distance walked: 40 ft, Assistive device utilized:Walker - 4 wheeled, Level of assistance: CGA and Min A, and Comments: shuffling/ festination increased with turns   FUNCTIONAL TESTS:  5 times sit to stand: 24.07 sec Timed up and go (TUG): 43.84 sec 10 meter walk test: 22.87 sec in 25 ft = 1.09 ft/sec Berg:  26/56 (Scores <45/56 indicate increased fall risk)                                                                                                                                 TREATMENT DATE: 01/23/2024    PATIENT EDUCATION: Education details: Eval results, POC; discussed benefits of OPPT versus HHPT Person educated: Patient and Spouse Education method: Explanation Education comprehension: verbalized understanding  HOME EXERCISE PROGRAM: Not yet initiated  GOALS: Goals reviewed with patient? Yes  SHORT TERM GOALS: Target date: 02/15/2024  Pt will be supervision with  HEP for improved strength, flexibility/decreased pain, balance. Baseline: Goal status: MET, 02/14/2024  2.  Pt will improve TUG score to less than or equal to 35 sec for decreased fall risk.  Baseline:  43 sec  Goal status:  IN PROGRESS   LONG TERM GOALS: Target date: 03/07/2024  Pt will be supervision with progression of HEP for improved strength, balance, transfers. Baseline:  Goal status: INITIAL  2.  Pt will improve 5x sit<>stand to less than or equal to 18 sec to demonstrate improved functional strength and transfer efficiency.  Baseline: 24 sec Goal status: INITIAL  3.  Pt will improve gait velocity to at least 1.8 ft/sec for improved gait efficiency and safety. Baseline: 1.07 ft/sec Goal status: INITIAL  4.  Pt will improve Berg score to at least 38/56 to decrease fall risk. Baseline: 26/56 Goal status: INITIAL  5.  Pt will verbalize understanding of fall prevention in home environment.  Baseline:  Goal status: INITIAL  ASSESSMENT:  CLINICAL IMPRESSION: Pt presents today without new complaints. Skilled PT session  focused on review of HEP, which pt return demo and reports working on at home.  Worked on lower extremity exercise and standing modified quadruped PWR! Moves at elevated mat.  Pt does very well with visual cues and targets with this set of PWR! Moves and would benefit from continued practice with this for potential addition to HEP.  Pt will continue to benefit from skilled PT towards goals for improved functional mobility and decreased fall risk.   OBJECTIVE IMPAIRMENTS: Abnormal gait, decreased balance, decreased mobility, difficulty walking, decreased ROM, decreased strength, impaired flexibility, postural dysfunction, and pain.   ACTIVITY LIMITATIONS: standing, transfers, bed mobility, bathing, toileting, dressing, self feeding, reach over head, hygiene/grooming, and locomotion level  PARTICIPATION LIMITATIONS: meal prep, cleaning, laundry, shopping,  community activity, and community fitness  PERSONAL FACTORS: 3+ comorbidities: see above are also affecting patient's functional outcome.   REHAB POTENTIAL: Good  CLINICAL DECISION MAKING: Evolving/moderate complexity  EVALUATION COMPLEXITY: Moderate  PLAN:  PT FREQUENCY: 2x/wk for 3 weeks, then 1x/wk for 3 weeks (pt having to wait to come in until after dental procedure)  PT DURATION: 6 weeks plus eval  PLANNED INTERVENTIONS: 97750- Physical Performance Testing, 97110-Therapeutic exercises, 97530- Therapeutic activity, [id number]- Neuromuscular re-education, 97535- Self Care, 02859- Manual therapy, [phone]- Gait training, Patient/Family education, and Balance training  PLAN FOR NEXT SESSION: Modified quadruped PWR! Moves again and consider adding to HEP.  Consider SAQ and supine trunk/hamstring flexibility (will need to work on bed mobility too);  Work on flexibility, sit to stand, gait technique, postural reeducation   STARLET GREIG ORN., PT 02/14/2024, 5:39 PM  [facility] at [facility] [address] [place], [address] [place], [place] [address] Phone # [phone] Fax # [phone]

## 2024-02-13 NOTE — Therapy (Signed)
 OUTPATIENT OCCUPATIONAL THERAPY [facility]  Treatment Note  Patient Name: Matthew [name] MRN: [id number] DOB:Jun 06, 1950, 73 y.o., male Today's Date: 02/13/2024  PCP: Okey Carlin Redbird, MD REFERRING PROVIDER: Tat, Asberry RAMAN, DO  END OF SESSION:  OT End of Session - 02/13/24 1450     Visit Number 3    Number of Visits 9    Date for Recertification  03/19/24    Authorization Type [employer]    OT Start Time 1449    OT Stop Time 1530    OT Time Calculation (min) 41 min            Past Medical History:  Diagnosis Date   Anxiety    Arthritis    GERD (gastroesophageal reflux disease)    occ remote history   Hypertension    Parkinson disease (HCC)    Prostate cancer (HCC)    Right knee pain    questionable meniscal tear   Seasonal allergies    Past Surgical History:  Procedure Laterality Date   COLONOSCOPY     Leg Laceration Repair  Left    Age 44   PELVIC LYMPH NODE DISSECTION Bilateral 03/22/2018   Procedure: PELVIC LYMPH NODE DISSECTION;  Surgeon: Devere Lonni Righter, MD;  Location: [facility];  Service: Urology;  Laterality: Bilateral;   PROSTATE BIOPSY     ROBOT ASSISTED LAPAROSCOPIC RADICAL PROSTATECTOMY N/A 03/22/2018   Procedure: XI ROBOTIC ASSISTED LAPAROSCOPIC PROSTATECTOMY;  Surgeon: Devere Lonni Righter, MD;  Location: [facility];  Service: Urology;  Laterality: N/A;   TOTAL KNEE ARTHROPLASTY Right 05/05/2019   Procedure: RIGHT TOTAL KNEE ARTHROPLASTY;  Surgeon: Liam Lerner, MD;  Location: [facility];  Service: Orthopedics;  Laterality: Right;   Patient Active Problem List   Diagnosis Date Noted   S/P TKR (total knee replacement), right 05/05/2019   Osteoarthritis of right knee 05/02/2019   Prostate cancer (HCC) 03/22/2018   Malignant neoplasm of prostate (HCC) 02/27/2018    ONSET DATE: referral date 11/19/23  REFERRING DIAG: G20.A2 (ICD-10-CM) - Parkinson's disease without dyskinesia, with fluctuating manifestations (HCC) R49.8 (ICD-10-CM)  - Hypophonia  THERAPY DIAG:  Other lack of coordination  Abnormal posture  Other symptoms and signs involving the nervous system  Muscle weakness (generalized)  Rationale for Evaluation and Treatment: Rehabilitation  SUBJECTIVE:   SUBJECTIVE STATEMENT: Pt reports hitting his Left hand on something. He thinks it was when getting in to the car, but his wife thinks it could have even happened when he was in the shower today.   Pt accompanied by: self and significant other (spouse)  PERTINENT HISTORY: PD, anxiety, arthritis, GERD, HTN, R TKR   PRECAUTIONS: Fall  WEIGHT BEARING RESTRICTIONS: No  PAIN:  Are you having pain? No  FALLS: Has patient fallen in last 6 months? Yes. Number of falls when he falls I can't get him up. A few falls; Has not fallen in the last 2 months  LIVING ENVIRONMENT: Lives with: lives with their spouse Lives in: House/apartment Stairs: 1 step down to screened porch with small portable ramp, 3 steps at the garage with 1 rail and wall Has following equipment at home: hospital bed, 4 wheeled walker, wheelchair, grab bars in the shower, BSC next to the shower for ease of transfers and dressing, arms on toilet, trapeze   PLOF: Requires assistive device for independence and Needs assistance with ADLs  PATIENT GOALS: strategies to increase ease with bed mobility and eating  OBJECTIVE:  Note: Objective measures were completed at Evaluation unless otherwise  noted.  HAND DOMINANCE: Right  ADLs: Overall ADLs: Spouse is assisting with all aspects of bathing and dressing.  Reports that it will take upwards of an hour for bathing and dressing.   Transfers/ambulation related to ADLs: Increased use of wheelchair in the home. Still with use of 4 wheeled walker intermittently.  Pt with tendency to veer to the left with ambulation Eating: wife cuts up food, gives a lot of finger foods.  Pt has tried a variety of adaptive utensils with minimal impact Grooming:  utilizing electric toothbrush and water  pick for oral care, difficulty with brushing hair  UB Dressing: wife assisting for time LB Dressing: wife assisting due to decreased reach towards floor and for time Toileting: requires momentum to stand up from toilet or will reach towards door for leverage/momentum, reports struggle with hygiene.  Increased time spent on the toilet due to constipation Bathing: wife assisting for time Tub Shower transfers: wife provides supervision, handle is just inside door and pt able to hold on to it while stepping over small ledge  Equipment: Grab bars, Walk in shower, and Long handled sponge  IADLs: Spouse completes all shopping, meal prep, and housekeeping tasks. Community mobility: Relies on family or friends for transportation Medication management: pt fills pill box and is able to take correct dosages at correct time Financial management: wife completes and did prior  MOBILITY STATUS: increased use of wheelchair in the home and in the community, veers to Left with ambulation with 4 wheeled walker  POSTURE COMMENTS:  rounded shoulders, forward head, and weight shift left  ACTIVITY TOLERANCE: Activity tolerance: diminished  FUNCTIONAL OUTCOME MEASURES: Physical performance test: PPT#2 (simulated eating) 37.82 & PPT#4 (donning/doffing jacket): TBD    COORDINATION: Not assessed  UE ROM:   L shoulder flexion 105, abduction 60, pronation 70        R shoulder flexion 100, abduction 90, pronation 80   UE MMT:   grossly 3+/5   MUSCLE TONE: RUE: Mild and LUE: Mild  COGNITION: Overall cognitive status: Within functional limits for tasks assessed and slower processing and speech  OBSERVATIONS: Bradykinesia; Pt requiring min assist for anterior weight shift for sit > stand.  Pt veering to L while he was walking back to treatment room.                                                                                                                     TREATMENT DATE:  02/13/24 Large amplitude: engaged in modified LSVT exercises with focus on reaching forwards, down to floor, overhead, and out to side.  OT providing demonstration and cues for amplitude and cues for foot placement prior to activity for increased BOS.  Pt continues to have most difficulty with overhead and out to side reaching.   Large amplitude: engaged in reaching outside BOS and across midline to vertical poles to facilitate increased reach and weight shifting outside BOS and across midline to aid in aspects of dressing, reaching for phone while supine in bed,  and aiding in pulling up/down covers on bed.  Engaged in PNF pattern reaching with ball in BUE incorporating trunk rotation and reaching across midline.  Pt benefiting from demonstration and cues for technique and increased weight shifting.   02/11/24 Medication: pt requiring increased time to retrieve water  bottle from slot on Rollator, OT recommending a taller water  bottle that may allow for increased ease of reaching.  Pt opening and retrieving meds from plastic bag without dropping or spilling.  Pt with increased time when sipping water .  OT educated on various styles of pill boxes to aid in both daily and times when he is out at appts. Large amplitude: engaged in modified LSVT exercises with focus on reaching forwards, down to floor, overhead, and out to side.  OT providing demonstration and cues for amplitude and foot placement prior to activity for increased BOS.  Pt with most difficulty with overhead and out to side reaching.   Bag exercises: transitioned to simulated ADLS for the following activities (using a plastic bag/scarf): donning shirt, pulling down shirt, and donning pants.  OT downgrading task to lifting BUE overhead and advancing bag under thigh to facilitate increased ROM in achievable manner. Sit > stand: pt required mod assist from elevated surface for sit > stand at end of session due to fatigue and  difficulty with sit > stand.   01/23/24 Self-feeding: educated on use of angled spoon to increase ease of getting bowl of spoon to mouth.  OT providing pictures and suggestions for trial of angled spoon as pt with good mobility with scooping this session, however decreased coordination with rotating spoon to mouth without spilling.  Pt's spouse to look into either purchasing angled spoon or bending one. LB dressing: educated on trialing shorter reacher to aid in LB dressing.  OT demonstrating technique with advancing gait belt over feet and then pulling up to knees with reacher.  Pt with increased ability to advance gait belt over R foot with use of reacher.  Pt's spouse to look into shorter reacher (as he has a long one). Educated on potential benefit from [facility] services as pt's spouse reports that he fatigues more quickly and has questions about whether he can tolerate all 3 therapies in a row.  Pt also requires increased time and assistance with getting in and out of the car.  Discussed [facility] social worker as well as PD/movement disorder Child psychotherapist for additional services in the home and for continuum of care.  Pt and spouse to discuss and decide if feasible to attend OP therapies or if they want to pursue home health.   PATIENT EDUCATION: Education details: ROM and large amplitude to carry over to ADLs Person educated: Patient and Spouse Education method: Explanation and Verbal cues Education comprehension: verbalized understanding and needs further education  HOME EXERCISE PROGRAM: TBD  GOALS: Goals reviewed with patient? Yes  SHORT TERM GOALS: Target date: 02/20/24  Pt will be independent in PD specific HEP. Baseline: Goal status: in progress  2.  Pt and spouse will report understanding of adaptive equipment and/or adaptive strategies to increase ease with ADLs/IADLs. Baseline:  Goal status: in progress  LONG TERM GOALS: Target date: 03/19/24  Pt will demonstrate improved ease with  feeding as evidenced by decreasing PPT#2 (self feeding) by 5 secs with use of AE PRN. Baseline: 37.82 Goal status: in progress  2.  Pt will demonstrate increased B shoulder ROM to 120 or greater to increase ease with UB dressing. Baseline:  Goal status:  in progress  3.  Pt with demonstrate improved ease with bed mobility with use of AE and/or adaptive strategies PRN. Baseline:  Goal status: in progress  4.  Patient will report at least two-point increase in average PSFS score or at least three-point increase in a single activity score indicating functionally significant improvement given minimum detectable change. Baseline: 2.7 Goal status: in progress  ASSESSMENT:  CLINICAL IMPRESSION: Patient is a 73 y.o. male who was seen today for occupational therapy treatment for progression of PD, with pt requiring increased physical assistance and increased time with ADLs.  Pt benefiting from cues for postural setup and amplitude during LSVT activities, modified PNF diagonals, and reaching outside and across midline for amplitude and reaching to aid in mobility and ADLs.  Pt more alert this session.  Pt will continue to benefit from skilled occupational therapy services to address ROM, strength, balance, introduction of compensatory strategies/AE prn, and implementation of an HEP to improve participation and safety during ADLs and improve quality of life.     PERFORMANCE DEFICITS: in functional skills including ADLs, coordination, ROM, strength, flexibility, Fine motor control, Gross motor control, balance, body mechanics, endurance, decreased knowledge of precautions, decreased knowledge of use of DME, and UE functional use and psychosocial skills including environmental adaptation and routines and behaviors.   IMPAIRMENTS: are limiting patient from ADLs, leisure, and social participation.    PLAN:  OT FREQUENCY: 1-2x/week  OT DURATION: 8 weeks  PLANNED INTERVENTIONS: 97168 OT Re-evaluation,  97535 self care/ADL training, 02889 therapeutic exercise, 97530 therapeutic activity, 97112 neuromuscular re-education, 97035 ultrasound, functional mobility training, psychosocial skills training, energy conservation, coping strategies training, patient/family education, and DME and/or AE instructions  RECOMMENDED OTHER SERVICES: NA  CONSULTED AND AGREED WITH PLAN OF CARE: Patient and family member/caregiver  PLAN FOR NEXT SESSION: LB dressing with reacher, bed mobility, self-feeding with angled spoon, UB ROM/large amplitude   Elinore Shults, OTR/L 02/13/2024, 2:50 PM   [facility] [address] [place], [place] [address] Phone # [phone] Fax # [phone]

## 2024-02-14 ENCOUNTER — Encounter: Payer: Self-pay | Admitting: Physical Therapy

## 2024-02-19 ENCOUNTER — Ambulatory Visit: Admitting: Physical Therapy

## 2024-02-19 ENCOUNTER — Ambulatory Visit: Admitting: Occupational Therapy

## 2024-02-19 ENCOUNTER — Encounter: Payer: Self-pay | Admitting: Physical Therapy

## 2024-02-19 DIAGNOSIS — M6281 Muscle weakness (generalized): Secondary | ICD-10-CM

## 2024-02-19 DIAGNOSIS — R2681 Unsteadiness on feet: Secondary | ICD-10-CM

## 2024-02-19 DIAGNOSIS — R471 Dysarthria and anarthria: Secondary | ICD-10-CM | POA: Diagnosis not present

## 2024-02-19 DIAGNOSIS — R293 Abnormal posture: Secondary | ICD-10-CM

## 2024-02-19 DIAGNOSIS — R29818 Other symptoms and signs involving the nervous system: Secondary | ICD-10-CM

## 2024-02-19 DIAGNOSIS — R278 Other lack of coordination: Secondary | ICD-10-CM

## 2024-02-19 NOTE — Therapy (Unsigned)
 OUTPATIENT OCCUPATIONAL THERAPY [facility]  Treatment Note  Patient Name: Matthew [name] MRN: [id number] DOB:1951/04/03, 74 y.o., male Today's Date: 02/19/2024  PCP: Okey Carlin Redbird, MD REFERRING PROVIDER: Tat, Asberry RAMAN, DO  END OF SESSION:      Past Medical History:  Diagnosis Date   Anxiety    Arthritis    GERD (gastroesophageal reflux disease)    occ remote history   Hypertension    Parkinson disease (HCC)    Prostate cancer (HCC)    Right knee pain    questionable meniscal tear   Seasonal allergies    Past Surgical History:  Procedure Laterality Date   COLONOSCOPY     Leg Laceration Repair  Left    Age 27   PELVIC LYMPH NODE DISSECTION Bilateral 03/22/2018   Procedure: PELVIC LYMPH NODE DISSECTION;  Surgeon: Devere Lonni Righter, MD;  Location: [facility];  Service: Urology;  Laterality: Bilateral;   PROSTATE BIOPSY     ROBOT ASSISTED LAPAROSCOPIC RADICAL PROSTATECTOMY N/A 03/22/2018   Procedure: XI ROBOTIC ASSISTED LAPAROSCOPIC PROSTATECTOMY;  Surgeon: Devere Lonni Righter, MD;  Location: [facility];  Service: Urology;  Laterality: N/A;   TOTAL KNEE ARTHROPLASTY Right 05/05/2019   Procedure: RIGHT TOTAL KNEE ARTHROPLASTY;  Surgeon: Liam Lerner, MD;  Location: [facility];  Service: Orthopedics;  Laterality: Right;   Patient Active Problem List   Diagnosis Date Noted   S/P TKR (total knee replacement), right 05/05/2019   Osteoarthritis of right knee 05/02/2019   Prostate cancer (HCC) 03/22/2018   Malignant neoplasm of prostate (HCC) 02/27/2018    ONSET DATE: referral date 11/19/23  REFERRING DIAG: G20.A2 (ICD-10-CM) - Parkinson's disease without dyskinesia, with fluctuating manifestations (HCC) R49.8 (ICD-10-CM) - Hypophonia  THERAPY DIAG:  No diagnosis found.  Rationale for Evaluation and Treatment: Rehabilitation  SUBJECTIVE:   SUBJECTIVE STATEMENT: Pt reports needing to take his medications.  Pt reports having pain in L shoulder.    Pt  accompanied by: self and significant other (spouse)  PERTINENT HISTORY: PD, anxiety, arthritis, GERD, HTN, R TKR   PRECAUTIONS: Fall  WEIGHT BEARING RESTRICTIONS: No  PAIN:  Are you having pain? Yes: NPRS scale: 1 Pain location: L shoulder Pain description: pulling, moderate pain Aggravating factors: moving it into certain positions Relieving factors: avoiding those movements.    FALLS: Has patient fallen in last 6 months? Yes. Number of falls when he falls I can't get him up. A few falls; Has not fallen in the last 2 months  LIVING ENVIRONMENT: Lives with: lives with their spouse Lives in: House/apartment Stairs: 1 step down to screened porch with small portable ramp, 3 steps at the garage with 1 rail and wall Has following equipment at home: hospital bed, 4 wheeled walker, wheelchair, grab bars in the shower, BSC next to the shower for ease of transfers and dressing, arms on toilet, trapeze   PLOF: Requires assistive device for independence and Needs assistance with ADLs  PATIENT GOALS: strategies to increase ease with bed mobility and eating  OBJECTIVE:  Note: Objective measures were completed at Evaluation unless otherwise noted.  HAND DOMINANCE: Right  ADLs: Overall ADLs: Spouse is assisting with all aspects of bathing and dressing.  Reports that it will take upwards of an hour for bathing and dressing.   Transfers/ambulation related to ADLs: Increased use of wheelchair in the home. Still with use of 4 wheeled walker intermittently.  Pt with tendency to veer to the left with ambulation Eating: wife cuts up food, gives a lot of finger  foods.  Pt has tried a variety of adaptive utensils with minimal impact Grooming: utilizing electric toothbrush and water  pick for oral care, difficulty with brushing hair  UB Dressing: wife assisting for time LB Dressing: wife assisting due to decreased reach towards floor and for time Toileting: requires momentum to stand up from toilet or  will reach towards door for leverage/momentum, reports struggle with hygiene.  Increased time spent on the toilet due to constipation Bathing: wife assisting for time Tub Shower transfers: wife provides supervision, handle is just inside door and pt able to hold on to it while stepping over small ledge  Equipment: Grab bars, Walk in shower, and Long handled sponge  IADLs: Spouse completes all shopping, meal prep, and housekeeping tasks. Community mobility: Relies on family or friends for transportation Medication management: pt fills pill box and is able to take correct dosages at correct time Financial management: wife completes and did prior  MOBILITY STATUS: increased use of wheelchair in the home and in the community, veers to Left with ambulation with 4 wheeled walker  POSTURE COMMENTS:  rounded shoulders, forward head, and weight shift left  ACTIVITY TOLERANCE: Activity tolerance: diminished  FUNCTIONAL OUTCOME MEASURES: Physical performance test: PPT#2 (simulated eating) 37.82 & PPT#4 (donning/doffing jacket): TBD    COORDINATION: Not assessed  UE ROM:   L shoulder flexion 105, abduction 60, pronation 70        R shoulder flexion 100, abduction 90, pronation 80   UE MMT:   grossly 3+/5   MUSCLE TONE: RUE: Mild and LUE: Mild  COGNITION: Overall cognitive status: Within functional limits for tasks assessed and slower processing and speech  OBSERVATIONS: Bradykinesia; Pt requiring min assist for anterior weight shift for sit > stand.  Pt veering to L while he was walking back to treatment room.                                                                                                                    TREATMENT DATE:  02/19/24 ***Medication: pt requiring increased time to retrieve water  bottle from slot on Rollator, OT recommending a taller water  bottle that may allow for increased ease of reaching.  Pt opening and retrieving meds from plastic bag without  dropping or spilling.  Pt with increased time when sipping water .  OT educated on various styles of pill boxes to aid in both daily and times when he is out at appts. L upper arm   02/13/24 Large amplitude: engaged in modified LSVT exercises with focus on reaching forwards, down to floor, overhead, and out to side.  OT providing demonstration and cues for amplitude and cues for foot placement prior to activity for increased BOS.  Pt continues to have most difficulty with overhead and out to side reaching.   Large amplitude: engaged in reaching outside BOS and across midline to vertical poles to facilitate increased reach and weight shifting outside BOS and across midline to aid in aspects of dressing, reaching for phone while supine in  bed, and aiding in pulling up/down covers on bed.  Engaged in PNF pattern reaching with ball in BUE incorporating trunk rotation and reaching across midline.  Pt benefiting from demonstration and cues for technique and increased weight shifting.   02/11/24 Medication: pt requiring increased time to retrieve water  bottle from slot on Rollator, OT recommending a taller water  bottle that may allow for increased ease of reaching.  Pt opening and retrieving meds from plastic bag without dropping or spilling.  Pt with increased time when sipping water .  OT educated on various styles of pill boxes to aid in both daily and times when he is out at appts. Large amplitude: engaged in modified LSVT exercises with focus on reaching forwards, down to floor, overhead, and out to side.  OT providing demonstration and cues for amplitude and foot placement prior to activity for increased BOS.  Pt with most difficulty with overhead and out to side reaching.   Bag exercises: transitioned to simulated ADLS for the following activities (using a plastic bag/scarf): donning shirt, pulling down shirt, and donning pants.  OT downgrading task to lifting BUE overhead and advancing bag under thigh to  facilitate increased ROM in achievable manner. Sit > stand: pt required mod assist from elevated surface for sit > stand at end of session due to fatigue and difficulty with sit > stand.   01/23/24 Self-feeding: educated on use of angled spoon to increase ease of getting bowl of spoon to mouth.  OT providing pictures and suggestions for trial of angled spoon as pt with good mobility with scooping this session, however decreased coordination with rotating spoon to mouth without spilling.  Pt's spouse to look into either purchasing angled spoon or bending one. LB dressing: educated on trialing shorter reacher to aid in LB dressing.  OT demonstrating technique with advancing gait belt over feet and then pulling up to knees with reacher.  Pt with increased ability to advance gait belt over R foot with use of reacher.  Pt's spouse to look into shorter reacher (as he has a long one). Educated on potential benefit from [facility] services as pt's spouse reports that he fatigues more quickly and has questions about whether he can tolerate all 3 therapies in a row.  Pt also requires increased time and assistance with getting in and out of the car.  Discussed [facility] social worker as well as PD/movement disorder Child psychotherapist for additional services in the home and for continuum of care.  Pt and spouse to discuss and decide if feasible to attend OP therapies or if they want to pursue home health.   PATIENT EDUCATION: Education details: ROM and large amplitude to carry over to ADLs Person educated: Patient and Spouse Education method: Explanation and Verbal cues Education comprehension: verbalized understanding and needs further education  HOME EXERCISE PROGRAM: TBD  GOALS: Goals reviewed with patient? Yes  SHORT TERM GOALS: Target date: 02/20/24  Pt will be independent in PD specific HEP. Baseline: Goal status: in progress  2.  Pt and spouse will report understanding of adaptive equipment and/or adaptive  strategies to increase ease with ADLs/IADLs. Baseline:  Goal status: in progress  LONG TERM GOALS: Target date: 03/19/24  Pt will demonstrate improved ease with feeding as evidenced by decreasing PPT#2 (self feeding) by 5 secs with use of AE PRN. Baseline: 37.82 Goal status: in progress  2.  Pt will demonstrate increased B shoulder ROM to 120 or greater to increase ease with UB dressing. Baseline:  Goal  status: in progress  3.  Pt with demonstrate improved ease with bed mobility with use of AE and/or adaptive strategies PRN. Baseline:  Goal status: in progress  4.  Patient will report at least two-point increase in average PSFS score or at least three-point increase in a single activity score indicating functionally significant improvement given minimum detectable change. Baseline: 2.7 Goal status: in progress  ASSESSMENT:  CLINICAL IMPRESSION: Patient is a 73 y.o. male who was seen today for occupational therapy treatment for progression of PD, with pt requiring increased physical assistance and increased time with ADLs.  Pt benefiting from cues for postural setup and amplitude during LSVT activities, modified PNF diagonals, and reaching outside and across midline for amplitude and reaching to aid in mobility and ADLs.  Pt more alert this session.  Pt will continue to benefit from skilled occupational therapy services to address ROM, strength, balance, introduction of compensatory strategies/AE prn, and implementation of an HEP to improve participation and safety during ADLs and improve quality of life.     PERFORMANCE DEFICITS: in functional skills including ADLs, coordination, ROM, strength, flexibility, Fine motor control, Gross motor control, balance, body mechanics, endurance, decreased knowledge of precautions, decreased knowledge of use of DME, and UE functional use and psychosocial skills including environmental adaptation and routines and behaviors.   IMPAIRMENTS: are limiting  patient from ADLs, leisure, and social participation.    PLAN:  OT FREQUENCY: 1-2x/week  OT DURATION: 8 weeks  PLANNED INTERVENTIONS: 97168 OT Re-evaluation, 97535 self care/ADL training, 02889 therapeutic exercise, 97530 therapeutic activity, 97112 neuromuscular re-education, 97035 ultrasound, functional mobility training, psychosocial skills training, energy conservation, coping strategies training, patient/family education, and DME and/or AE instructions  RECOMMENDED OTHER SERVICES: NA  CONSULTED AND AGREED WITH PLAN OF CARE: Patient and family member/caregiver  PLAN FOR NEXT SESSION: LB dressing with reacher, bed mobility, self-feeding with angled spoon, UB ROM/large amplitude   Montoya Watkin, OTR/L 02/19/2024, 2:14 PM   [facility] at [facility] [address] [place], [place] [address] Phone # [phone] Fax # [phone]

## 2024-02-19 NOTE — Therapy (Signed)
 OUTPATIENT PHYSICAL THERAPY NEURO TREATMENT NOTE   Patient Name: Matthew [name] MRN: [id number] DOB:1951/04/16, 73 y.o., male Today's Date: 02/19/2024   PCP: Okey Carlin Redbird, MD REFERRING PROVIDER: Tat, Asberry RAMAN, DO   END OF SESSION:  PT End of Session - 02/19/24 1324     Visit Number 4    Number of Visits 10    Date for Recertification  03/07/24    Authorization Type HTA    Progress Note Due on Visit 10    PT Start Time 1324    PT Stop Time 1404    PT Time Calculation (min) 40 min    Equipment Utilized During Treatment Gait belt    Activity Tolerance Patient tolerated treatment well    Behavior During Therapy WFL for tasks assessed/performed             Past Medical History:  Diagnosis Date   Anxiety    Arthritis    GERD (gastroesophageal reflux disease)    occ remote history   Hypertension    Parkinson disease (HCC)    Prostate cancer (HCC)    Right knee pain    questionable meniscal tear   Seasonal allergies    Past Surgical History:  Procedure Laterality Date   COLONOSCOPY     Leg Laceration Repair  Left    Age 53   PELVIC LYMPH NODE DISSECTION Bilateral 03/22/2018   Procedure: PELVIC LYMPH NODE DISSECTION;  Surgeon: Devere Lonni Righter, MD;  Location: [facility];  Service: Urology;  Laterality: Bilateral;   PROSTATE BIOPSY     ROBOT ASSISTED LAPAROSCOPIC RADICAL PROSTATECTOMY N/A 03/22/2018   Procedure: XI ROBOTIC ASSISTED LAPAROSCOPIC PROSTATECTOMY;  Surgeon: Devere Lonni Righter, MD;  Location: [facility];  Service: Urology;  Laterality: N/A;   TOTAL KNEE ARTHROPLASTY Right 05/05/2019   Procedure: RIGHT TOTAL KNEE ARTHROPLASTY;  Surgeon: Liam Lerner, MD;  Location: [facility];  Service: Orthopedics;  Laterality: Right;   Patient Active Problem List   Diagnosis Date Noted   S/P TKR (total knee replacement), right 05/05/2019   Osteoarthritis of right knee 05/02/2019   Prostate cancer (HCC) 03/22/2018   Malignant neoplasm of prostate (HCC)  02/27/2018    ONSET DATE: MD referral 11/19/2023  REFERRING DIAG:  G20.A2 (ICD-10-CM) - Parkinson's disease without dyskinesia, with fluctuating manifestations (HCC)  R49.8 (ICD-10-CM) - Hypophonia    THERAPY DIAG:  Abnormal posture  Muscle weakness (generalized)  Unsteadiness on feet  Rationale for Evaluation and Treatment: Rehabilitation  SUBJECTIVE:  SUBJECTIVE STATEMENT: Just been quite a day. Pt accompanied by: significant other  PERTINENT HISTORY: Parkinson's, R TKR, prostate Ca, HTN  PAIN:  Are you having pain? No and pain increases with movement 1-5/10 with shoulders and back  PRECAUTIONS: Fall and Other: hx of neurogenic orthostatic hypotension  RED FLAGS: None   WEIGHT BEARING RESTRICTIONS: No  FALLS: Has patient fallen in last 6 months? Several falls; less falls since hospital bed; may 1 fall every 1-2 months Feels like he may lose balance backwards and wife provides very close supervision  LIVING ENVIRONMENT: Lives with: lives with their family Lives in: House/apartment Stairs: 2-3 steps to enter Has following equipment at home: Vannie - 2 wheeled, Environmental consultant - 4 wheeled, Wheelchair (manual), and hospital bed and manual w/c and transport w/c; grab bars in shower. BSC, lift chair.  Has a lower extremity pedaler at home (since knee surgery)  PLOF: Needs assistance with gait, Needs assistance with transfers, and Leisure: does voice exercises; does PWR! Moves exercises (about 15-20 min/day), dance class online, chair ex on [employer] (Sit and Be Fit)  PATIENT GOALS: To get a better exercise routine compared to what I'm doing now.  To try to figure out what he can do for exercise and posture.  OBJECTIVE:      TODAY'S TREATMENT: 02/19/2024 Activity Comments     Seated forward lean  to upright posture 2 x 5 reps Pushing up from knees  Seated march, LAQ,cues for large amplitude movement 2 x10 reps for activation, seated aerobic warm up  Standing modified quadruped-at elevated mat: PWR! Up 2 x 5 reps PWR! [name] 2 x 5 reps PWR! Twist 2 x 5 reps PWR! Step 2 x 5 reps   Visual, verbal, tactile cues for amplitude, posture One sitting rest break between sets  Stand x 5-10 sec no device Sit to stand from chair,  2 x 5 reps   Stand at locked walker: Marching in place x 10 Forward step and weightshift x 5 reps each leg     Access Code: [email or web address] URL: [email or web address] Date: 02/11/2024 Prepared by: [facility] - [facility]  Exercises - Seated Flexion Stretch with Swiss Ball  - 1-2 x daily - 7 x weekly - 1 sets - 5 reps - Seated Thoracic Flexion and Rotation with Swiss Ball  - 1-2 x daily - 7 x weekly - 1 sets - 5 reps - Seated Hamstring Stretch  - 2 x daily - 7 x weekly - 1 sets - 3 reps  PATIENT EDUCATION: Education details: Continue current HEP; try to look at home/have wife come next visit to add PWR! Moves quadruped to HEP Person educated: Patient Education method: Explanation, Demonstration, and Handouts Education comprehension: verbalized understanding and returned demonstration  ----------------------------------------------------- Note: Objective measures were completed at Evaluation unless otherwise noted.  DIAGNOSTIC FINDINGS: NA for this episode  COGNITION: Overall cognitive status: Within functional limits for tasks assessed and slowed response time   POSTURE: rounded shoulders, forward head, posterior pelvic tilt, flexed trunk , and weight shift left  LOWER EXTREMITY ROM:     Active  Right Eval Left Eval  Hip flexion    Hip extension    Hip abduction    Hip adduction    Hip internal rotation    Hip external rotation    Knee flexion    Knee extension -35 -40  Ankle dorsiflexion    Ankle  plantarflexion    Ankle inversion    Ankle eversion     (  Blank rows = not tested)  LOWER EXTREMITY MMT:    MMT Right Eval Left Eval  Hip flexion 4 4  Hip extension    Hip abduction 4 4  Hip adduction 4 4  Hip internal rotation    Hip external rotation    Knee flexion 4 4  Knee extension 4 4  Ankle dorsiflexion 4 4  Ankle plantarflexion    Ankle inversion    Ankle eversion    (Blank rows = not tested)  BED MOBILITY:  Not tested  TRANSFERS: Sit to stand: CGA  Assistive device utilized: None     Stand to sit: CGA  Assistive device utilized: hands to reach to chair      GAIT: Findings: Gait Characteristics: step to pattern, decreased step length- Right, decreased step length- Left, knee flexed in stance- Right, knee flexed in stance- Left, shuffling, festinating, lateral lean- Left, poor foot clearance- Right, and poor foot clearance- Left, Distance walked: 40 ft, Assistive device utilized:Walker - 4 wheeled, Level of assistance: CGA and Min A, and Comments: shuffling/ festination increased with turns   FUNCTIONAL TESTS:  5 times sit to stand: 24.07 sec Timed up and go (TUG): 43.84 sec 10 meter walk test: 22.87 sec in 25 ft = 1.09 ft/sec Berg:  26/56 (Scores <45/56 indicate increased fall risk)                                                                                                                                 TREATMENT DATE: 01/23/2024    PATIENT EDUCATION: Education details: Eval results, POC; discussed benefits of OPPT versus HHPT Person educated: Patient and Spouse Education method: Explanation Education comprehension: verbalized understanding  HOME EXERCISE PROGRAM: Not yet initiated  GOALS: Goals reviewed with patient? Yes  SHORT TERM GOALS: Target date: 02/15/2024  Pt will be supervision with HEP for improved strength, flexibility/decreased pain, balance. Baseline: Goal status: MET, 02/14/2024  2.  Pt will improve TUG score to less than or  equal to 35 sec for decreased fall risk.  Baseline:  43 sec  Goal status:  IN PROGRESS   LONG TERM GOALS: Target date: 03/07/2024  Pt will be supervision with progression of HEP for improved strength, balance, transfers. Baseline:  Goal status: INITIAL  2.  Pt will improve 5x sit<>stand to less than or equal to 18 sec to demonstrate improved functional strength and transfer efficiency.  Baseline: 24 sec Goal status: INITIAL  3.  Pt will improve gait velocity to at least 1.8 ft/sec for improved gait efficiency and safety. Baseline: 1.07 ft/sec Goal status: INITIAL  4.  Pt will improve Berg score to at least 38/56 to decrease fall risk. Baseline: 26/56 Goal status: INITIAL  5.  Pt will verbalize understanding of fall prevention in home environment.  Baseline:  Goal status: INITIAL  ASSESSMENT:  CLINICAL IMPRESSION: Pt presents today and reports its been a slow day.  Skilled PT session focused  on seated posture and leg exercises then modified quadruped PWR! Moves.  Pt does well with the PWR! Moves today and would benefit from trying to do them at home.  Recommend that wife come in next visit (we don't have great pictures of this exercise), so we can make sure he's safe doing these and feels good about transitioning to doing these as part of HEP.  Pt will continue to benefit from skilled PT towards goals for improved functional mobility and decreased fall risk.   OBJECTIVE IMPAIRMENTS: Abnormal gait, decreased balance, decreased mobility, difficulty walking, decreased ROM, decreased strength, impaired flexibility, postural dysfunction, and pain.   ACTIVITY LIMITATIONS: standing, transfers, bed mobility, bathing, toileting, dressing, self feeding, reach over head, hygiene/grooming, and locomotion level  PARTICIPATION LIMITATIONS: meal prep, cleaning, laundry, shopping, community activity, and community fitness  PERSONAL FACTORS: 3+ comorbidities: see above are also affecting  patient's functional outcome.   REHAB POTENTIAL: Good  CLINICAL DECISION MAKING: Evolving/moderate complexity  EVALUATION COMPLEXITY: Moderate  PLAN:  PT FREQUENCY: 2x/wk for 3 weeks, then 1x/wk for 3 weeks (pt having to wait to come in until after dental procedure)  PT DURATION: 6 weeks plus eval  PLANNED INTERVENTIONS: 97750- Physical Performance Testing, 97110-Therapeutic exercises, 97530- Therapeutic activity, [id number]- Neuromuscular re-education, 97535- Self Care, 02859- Manual therapy, [phone]- Gait training, Patient/Family education, and Balance training  PLAN FOR NEXT SESSION: Perform TUG for STG check.  *Modified quadruped PWR! Moves again at elevated mat and consider adding to HEP-wife present to film with phone camera?.  Consider SAQ and supine trunk/hamstring flexibility (will need to work on bed mobility too);  Work on flexibility, sit to stand, gait technique, postural reeducation   STARLET GREIG ORN., PT 02/19/2024, 4:32 PM  [facility] at [facility] [address] [place], [address] [place], [place] [address] Phone # [phone] Fax # [phone]

## 2024-02-21 ENCOUNTER — Ambulatory Visit: Attending: Neurology

## 2024-02-21 ENCOUNTER — Ambulatory Visit: Admitting: Occupational Therapy

## 2024-02-21 DIAGNOSIS — R293 Abnormal posture: Secondary | ICD-10-CM | POA: Insufficient documentation

## 2024-02-21 DIAGNOSIS — R471 Dysarthria and anarthria: Secondary | ICD-10-CM | POA: Diagnosis not present

## 2024-02-21 DIAGNOSIS — R4701 Aphasia: Secondary | ICD-10-CM | POA: Insufficient documentation

## 2024-02-21 DIAGNOSIS — R2689 Other abnormalities of gait and mobility: Secondary | ICD-10-CM | POA: Insufficient documentation

## 2024-02-21 DIAGNOSIS — R41840 Attention and concentration deficit: Secondary | ICD-10-CM | POA: Insufficient documentation

## 2024-02-21 DIAGNOSIS — R29818 Other symptoms and signs involving the nervous system: Secondary | ICD-10-CM

## 2024-02-21 DIAGNOSIS — M6281 Muscle weakness (generalized): Secondary | ICD-10-CM | POA: Diagnosis not present

## 2024-02-21 DIAGNOSIS — R41841 Cognitive communication deficit: Secondary | ICD-10-CM | POA: Insufficient documentation

## 2024-02-21 DIAGNOSIS — R278 Other lack of coordination: Secondary | ICD-10-CM | POA: Diagnosis not present

## 2024-02-21 DIAGNOSIS — R2681 Unsteadiness on feet: Secondary | ICD-10-CM | POA: Diagnosis not present

## 2024-02-21 DIAGNOSIS — R29898 Other symptoms and signs involving the musculoskeletal system: Secondary | ICD-10-CM | POA: Diagnosis not present

## 2024-02-21 DIAGNOSIS — G20A2 Parkinson's disease without dyskinesia, with fluctuations: Secondary | ICD-10-CM | POA: Insufficient documentation

## 2024-02-21 DIAGNOSIS — R1310 Dysphagia, unspecified: Secondary | ICD-10-CM | POA: Insufficient documentation

## 2024-02-21 NOTE — Therapy (Signed)
 OUTPATIENT PHYSICAL THERAPY NEURO TREATMENT NOTE   Patient Name: Matthew [name] MRN: [id number] DOB:10/05/1950, 73 y.o., male Today's Date: 02/21/2024   PCP: Okey Carlin Redbird, MD REFERRING PROVIDER: Tat, Asberry RAMAN, DO   END OF SESSION:  PT End of Session - 02/21/24 1020     Visit Number 5    Number of Visits 10    Date for Recertification  03/07/24    Authorization Type HTA    Progress Note Due on Visit 10    PT Start Time 1020    PT Stop Time 1100    PT Time Calculation (min) 40 min    Equipment Utilized During Treatment Gait belt    Activity Tolerance Patient tolerated treatment well    Behavior During Therapy WFL for tasks assessed/performed             Past Medical History:  Diagnosis Date   Anxiety    Arthritis    GERD (gastroesophageal reflux disease)    occ remote history   Hypertension    Parkinson disease (HCC)    Prostate cancer (HCC)    Right knee pain    questionable meniscal tear   Seasonal allergies    Past Surgical History:  Procedure Laterality Date   COLONOSCOPY     Leg Laceration Repair  Left    Age 71   PELVIC LYMPH NODE DISSECTION Bilateral 03/22/2018   Procedure: PELVIC LYMPH NODE DISSECTION;  Surgeon: Devere Lonni Righter, MD;  Location: [facility];  Service: Urology;  Laterality: Bilateral;   PROSTATE BIOPSY     ROBOT ASSISTED LAPAROSCOPIC RADICAL PROSTATECTOMY N/A 03/22/2018   Procedure: XI ROBOTIC ASSISTED LAPAROSCOPIC PROSTATECTOMY;  Surgeon: Devere Lonni Righter, MD;  Location: [facility];  Service: Urology;  Laterality: N/A;   TOTAL KNEE ARTHROPLASTY Right 05/05/2019   Procedure: RIGHT TOTAL KNEE ARTHROPLASTY;  Surgeon: Liam Lerner, MD;  Location: [facility];  Service: Orthopedics;  Laterality: Right;   Patient Active Problem List   Diagnosis Date Noted   S/P TKR (total knee replacement), right 05/05/2019   Osteoarthritis of right knee 05/02/2019   Prostate cancer (HCC) 03/22/2018   Malignant neoplasm of prostate (HCC)  02/27/2018    ONSET DATE: MD referral 11/19/2023  REFERRING DIAG:  G20.A2 (ICD-10-CM) - Parkinson's disease without dyskinesia, with fluctuating manifestations (HCC)  R49.8 (ICD-10-CM) - Hypophonia    THERAPY DIAG:  Abnormal posture  Muscle weakness (generalized)  Unsteadiness on feet  Other abnormalities of gait and mobility  Other symptoms and signs involving the musculoskeletal system  Rationale for Evaluation and Treatment: Rehabilitation  SUBJECTIVE:  SUBJECTIVE STATEMENT: Doing ok, just low energy and slow speed Pt accompanied by: significant other  PERTINENT HISTORY: Parkinson's, R TKR, prostate Ca, HTN  PAIN:  No PRECAUTIONS: Fall and Other: hx of neurogenic orthostatic hypotension  RED FLAGS: None   WEIGHT BEARING RESTRICTIONS: No  FALLS: Has patient fallen in last 6 months? Several falls; less falls since hospital bed; may 1 fall every 1-2 months Feels like he may lose balance backwards and wife provides very close supervision  LIVING ENVIRONMENT: Lives with: lives with their family Lives in: House/apartment Stairs: 2-3 steps to enter Has following equipment at home: Vannie - 2 wheeled, Environmental consultant - 4 wheeled, Wheelchair (manual), and hospital bed and manual w/c and transport w/c; grab bars in shower. BSC, lift chair.  Has a lower extremity pedaler at home (since knee surgery)  PLOF: Needs assistance with gait, Needs assistance with transfers, and Leisure: does voice exercises; does PWR! Moves exercises (about 15-20 min/day), dance class online, chair ex on [employer] ([employer] and [employer])  PATIENT GOALS: To get a better exercise routine compared to what I'm doing now.  To try to figure out what he can do for exercise and posture.  OBJECTIVE:      TODAY'S TREATMENT:  02/21/24 Activity Comments  Modified quadruped PWR moves Spouse filming for home performance/reference  Postural re-education Standing with physioball in corner-for trunk/hip extension Running physioball up corner for overhead extension/trunk extension                TODAY'S TREATMENT: 02/19/2024 Activity Comments     Seated forward lean to upright posture 2 x 5 reps Pushing up from knees  Seated march, LAQ,cues for large amplitude movement 2 x10 reps for activation, seated aerobic warm up  Standing modified quadruped-at elevated mat: PWR! Up 2 x 5 reps PWR! [name] 2 x 5 reps PWR! Twist 2 x 5 reps PWR! Step 2 x 5 reps   Visual, verbal, tactile cues for amplitude, posture One sitting rest break between sets  Stand x 5-10 sec no device Sit to stand from chair,  2 x 5 reps   Stand at locked walker: Marching in place x 10 Forward step and weightshift x 5 reps each leg     Access Code: [email or web address] URL: [email or web address] Date: 02/11/2024 Prepared by: [facility] Outpatient  Rehab - [facility]  Exercises - Seated Flexion Stretch with Swiss Ball  - 1-2 x daily - 7 x weekly - 1 sets - 5 reps - Seated Thoracic Flexion and Rotation with Swiss Ball  - 1-2 x daily - 7 x weekly - 1 sets - 5 reps - Seated Hamstring Stretch  - 2 x daily - 7 x weekly - 1 sets - 3 reps - Wall Quarter Squat with Swiss Ball  - 1 x daily - 7 x weekly - 3 sets - 10 reps - Serratus Activation at [employer] with [employer] and Resistance Band  - 1 x daily - 7 x weekly - 3 sets - 10 reps  PATIENT EDUCATION: Education details: Continue current HEP; try to look at home/have wife come next visit to add PWR! Moves quadruped to HEP Person educated: Patient Education method: Explanation, Demonstration, and Handouts Education comprehension: verbalized understanding and returned demonstration  ----------------------------------------------------- Note: Objective measures were completed at Evaluation unless  otherwise noted.  DIAGNOSTIC FINDINGS: NA for this episode  COGNITION: Overall cognitive status: Within functional limits for tasks assessed and slowed response time   POSTURE: rounded shoulders, forward head,  posterior pelvic tilt, flexed trunk , and weight shift left  LOWER EXTREMITY ROM:     Active  Right Eval Left Eval  Hip flexion    Hip extension    Hip abduction    Hip adduction    Hip internal rotation    Hip external rotation    Knee flexion    Knee extension -35 -40  Ankle dorsiflexion    Ankle plantarflexion    Ankle inversion    Ankle eversion     (Blank rows = not tested)  LOWER EXTREMITY MMT:    MMT Right Eval Left Eval  Hip flexion 4 4  Hip extension    Hip abduction 4 4  Hip adduction 4 4  Hip internal rotation    Hip external rotation    Knee flexion 4 4  Knee extension 4 4  Ankle dorsiflexion 4 4  Ankle plantarflexion    Ankle inversion    Ankle eversion    (Blank rows = not tested)  BED MOBILITY:  Not tested  TRANSFERS: Sit to stand: CGA  Assistive device utilized: None     Stand to sit: CGA  Assistive device utilized: hands to reach to chair      GAIT: Findings: Gait Characteristics: step to pattern, decreased step length- Right, decreased step length- Left, knee flexed in stance- Right, knee flexed in stance- Left, shuffling, festinating, lateral lean- Left, poor foot clearance- Right, and poor foot clearance- Left, Distance walked: 40 ft, Assistive device utilized:Walker - 4 wheeled, Level of assistance: CGA and Min A, and Comments: shuffling/ festination increased with turns   FUNCTIONAL TESTS:  5 times sit to stand: 24.07 sec Timed up and go (TUG): 43.84 sec 10 meter walk test: 22.87 sec in 25 ft = 1.09 ft/sec Berg:  26/56 (Scores <45/56 indicate increased fall risk)                                                                                                                                 TREATMENT DATE: 01/23/2024     PATIENT EDUCATION: Education details: Eval results, POC; discussed benefits of OPPT versus HHPT Person educated: Patient and Spouse Education method: Explanation Education comprehension: verbalized understanding  HOME EXERCISE PROGRAM: Not yet initiated  GOALS: Goals reviewed with patient? Yes  SHORT TERM GOALS: Target date: 02/15/2024  Pt will be supervision with HEP for improved strength, flexibility/decreased pain, balance. Baseline: Goal status: MET, 02/14/2024  2.  Pt will improve TUG score to less than or equal to 35 sec for decreased fall risk.  Baseline:  43 sec  Goal status:  IN PROGRESS   LONG TERM GOALS: Target date: 03/07/2024  Pt will be supervision with progression of HEP for improved strength, balance, transfers. Baseline:  Goal status: INITIAL  2.  Pt will improve 5x sit<>stand to less than or equal to 18 sec to demonstrate improved functional strength and transfer efficiency.  Baseline: 24 sec  Goal status: INITIAL  3.  Pt will improve gait velocity to at least 1.8 ft/sec for improved gait efficiency and safety. Baseline: 1.07 ft/sec Goal status: INITIAL  4.  Pt will improve Berg score to at least 38/56 to decrease fall risk. Baseline: 26/56 Goal status: INITIAL  5.  Pt will verbalize understanding of fall prevention in home environment.  Baseline:  Goal status: INITIAL  ASSESSMENT:  CLINICAL IMPRESSION: Instructed in modified quadruped large amplitude movements to improve coordination and flexibility for improved fluidity of movement w/ good return demonstration and filmed on pt phone for home carryover. Instructed in postural re-education techniques to promote trunk/hip extension for upright posture/position w/ spouse SBA for carryover at home and good safety demo throughout.   OBJECTIVE IMPAIRMENTS: Abnormal gait, decreased balance, decreased mobility, difficulty walking, decreased ROM, decreased strength, impaired flexibility, postural  dysfunction, and pain.   ACTIVITY LIMITATIONS: standing, transfers, bed mobility, bathing, toileting, dressing, self feeding, reach over head, hygiene/grooming, and locomotion level  PARTICIPATION LIMITATIONS: meal prep, cleaning, laundry, shopping, community activity, and community fitness  PERSONAL FACTORS: 3+ comorbidities: see above are also affecting patient's functional outcome.   REHAB POTENTIAL: Good  CLINICAL DECISION MAKING: Evolving/moderate complexity  EVALUATION COMPLEXITY: Moderate  PLAN:  PT FREQUENCY: 2x/wk for 3 weeks, then 1x/wk for 3 weeks (pt having to wait to come in until after dental procedure)  PT DURATION: 6 weeks plus eval  PLANNED INTERVENTIONS: 97750- Physical Performance Testing, 97110-Therapeutic exercises, 97530- Therapeutic activity, [id number]- Neuromuscular re-education, 97535- Self Care, 02859- Manual therapy, [phone]- Gait training, Patient/Family education, and Balance training  PLAN FOR NEXT SESSION: Perform TUG for STG check. .  Consider SAQ and supine trunk/hamstring flexibility (will need to work on bed mobility too);  Work on flexibility, sit to stand, gait technique, postural reeducation   Jonette MARLA Sandifer, PT 02/21/2024, 10:20 AM  [facility] at [facility] [address], [address] [place], [place] [address] Phone # [phone] Fax # [phone]

## 2024-02-21 NOTE — Therapy (Signed)
 OUTPATIENT OCCUPATIONAL THERAPY PARKINSON'S  Treatment Note  Patient Name: Matthew [name] MRN: [id number] DOB:1951-05-18, 73 y.o., male Today's Date: 02/21/2024  PCP: Okey Carlin Redbird, MD REFERRING PROVIDER: Tat, Asberry RAMAN, DO  END OF SESSION:  OT End of Session - 02/21/24 1154     Visit Number 5    Number of Visits 9    Date for Recertification  03/19/24    Authorization Type [employer]    OT Start Time 1109    OT Stop Time 1150    OT Time Calculation (min) 41 min              Past Medical History:  Diagnosis Date   Anxiety    Arthritis    GERD (gastroesophageal reflux disease)    occ remote history   Hypertension    Parkinson disease (HCC)    Prostate cancer (HCC)    Right knee pain    questionable meniscal tear   Seasonal allergies    Past Surgical History:  Procedure Laterality Date   COLONOSCOPY     Leg Laceration Repair  Left    Age 31   PELVIC LYMPH NODE DISSECTION Bilateral 03/22/2018   Procedure: PELVIC LYMPH NODE DISSECTION;  Surgeon: Devere Lonni Righter, MD;  Location: [facility];  Service: Urology;  Laterality: Bilateral;   PROSTATE BIOPSY     ROBOT ASSISTED LAPAROSCOPIC RADICAL PROSTATECTOMY N/A 03/22/2018   Procedure: XI ROBOTIC ASSISTED LAPAROSCOPIC PROSTATECTOMY;  Surgeon: Devere Lonni Righter, MD;  Location: [facility];  Service: Urology;  Laterality: N/A;   TOTAL KNEE ARTHROPLASTY Right 05/05/2019   Procedure: RIGHT TOTAL KNEE ARTHROPLASTY;  Surgeon: Liam Lerner, MD;  Location: [facility];  Service: Orthopedics;  Laterality: Right;   Patient Active Problem List   Diagnosis Date Noted   S/P TKR (total knee replacement), right 05/05/2019   Osteoarthritis of right knee 05/02/2019   Prostate cancer (HCC) 03/22/2018   Malignant neoplasm of prostate (HCC) 02/27/2018    ONSET DATE: referral date 11/19/23  REFERRING DIAG: G20.A2 (ICD-10-CM) - Parkinson's disease without dyskinesia, with fluctuating manifestations (HCC) R49.8  (ICD-10-CM) - Hypophonia  THERAPY DIAG:  Abnormal posture  Muscle weakness (generalized)  Other symptoms and signs involving the nervous system  Rationale for Evaluation and Treatment: Rehabilitation  SUBJECTIVE:   SUBJECTIVE STATEMENT: Pt reports falling the other day as he was walking down the ramp.  Pt accompanied by: self and significant other (spouse)  PERTINENT HISTORY: PD, anxiety, arthritis, GERD, HTN, R TKR   PRECAUTIONS: Fall  WEIGHT BEARING RESTRICTIONS: No  PAIN:  Are you having pain? Yes: NPRS scale: 1 Pain location: L shoulder Pain description: pulling, moderate pain Aggravating factors: moving it into certain positions Relieving factors: avoiding those movements.    FALLS: Has patient fallen in last 6 months? Yes. Number of falls when he falls I can't get him up. A few falls; Has not fallen in the last 2 months  LIVING ENVIRONMENT: Lives with: lives with their spouse Lives in: House/apartment Stairs: 1 step down to screened porch with small portable ramp, 3 steps at the garage with 1 rail and wall Has following equipment at home: hospital bed, 4 wheeled walker, wheelchair, grab bars in the shower, BSC next to the shower for ease of transfers and dressing, arms on toilet, trapeze   PLOF: Requires assistive device for independence and Needs assistance with ADLs  PATIENT GOALS: strategies to increase ease with bed mobility and eating  OBJECTIVE:  Note: Objective measures were completed at Evaluation unless  otherwise noted.  HAND DOMINANCE: Right  ADLs: Overall ADLs: Spouse is assisting with all aspects of bathing and dressing.  Reports that it will take upwards of an hour for bathing and dressing.   Transfers/ambulation related to ADLs: Increased use of wheelchair in the home. Still with use of 4 wheeled walker intermittently.  Pt with tendency to veer to the left with ambulation Eating: wife cuts up food, gives a lot of finger foods.  Pt has tried a  variety of adaptive utensils with minimal impact Grooming: utilizing electric toothbrush and water  pick for oral care, difficulty with brushing hair  UB Dressing: wife assisting for time LB Dressing: wife assisting due to decreased reach towards floor and for time Toileting: requires momentum to stand up from toilet or will reach towards door for leverage/momentum, reports struggle with hygiene.  Increased time spent on the toilet due to constipation Bathing: wife assisting for time Tub Shower transfers: wife provides supervision, handle is just inside door and pt able to hold on to it while stepping over small ledge  Equipment: Grab bars, Walk in shower, and Long handled sponge  IADLs: Spouse completes all shopping, meal prep, and housekeeping tasks. Community mobility: Relies on family or friends for transportation Medication management: pt fills pill box and is able to take correct dosages at correct time Financial management: wife completes and did prior  MOBILITY STATUS: increased use of wheelchair in the home and in the community, veers to Left with ambulation with 4 wheeled walker  POSTURE COMMENTS:  rounded shoulders, forward head, and weight shift left  ACTIVITY TOLERANCE: Activity tolerance: diminished  FUNCTIONAL OUTCOME MEASURES: Physical performance test: PPT#2 (simulated eating) 37.82 & PPT#4 (donning/doffing jacket): TBD    COORDINATION: Not assessed  UE ROM:   L shoulder flexion 105, abduction 60, pronation 70        R shoulder flexion 100, abduction 90, pronation 80   UE MMT:   grossly 3+/5   MUSCLE TONE: RUE: Mild and LUE: Mild  COGNITION: Overall cognitive status: Within functional limits for tasks assessed and slower processing and speech  OBSERVATIONS: Bradykinesia; Pt requiring min assist for anterior weight shift for sit > stand.  Pt veering to L while he was walking back to treatment room.                                                                                                                     TREATMENT DATE:  02/21/24 Bed mobility: OT aiding pt in transitioning from sit > supine in therapy mat with pt managing UB and OT aiding in lifting and positioning LB.  During transition from supine > sit OT providing support at UB to push up into partial sitting with pt then able to advance legs from EOB to floor.   Supine UE ROM: OT providing demonstration and tactile cues for improved positioning/alignment for LUE and cues to maintain stretch 20-30 seconds for improved ROM and stretch - Open Book Chest Stretch  - Supine Shoulder  Overhead Flexion with BUE together - Supine Shoulder Horizontal Abduction to/from Chest UB dressing: OT educating on threading BUE first and then pulling shirt overhead.  Pt demonstrating improved technique when threading BUE first, still benefiting from assist to pull shirt fully over elbows and then to pull shirt down torso. Pt demonstrating improved shoulder flexion and ability to pull shirt up to and over head with min assist to pull down over back of head and torso.  Pt then requiring assistance to pull shirt over head but then able to doff without additional assistance.  Encouraged pt and spouse to attempt in this manner to both increase patient participation and decrease burden of care.   02/19/24 Medication: pt requiring increased time to retrieve medication tin from slot on Rollator, OT ultimately retrieving for pt with cues to shift it to a different pocket allowing for a larger opening.  Pt opening and retrieving meds from small tin with improved ease compared to bag during previous session. Pt with increased time when sipping water . Large amplitude/ Bag exercises: engaged in large amplitude UE movements with abduction followed by movements to more closely simulate ADLs (using a plastic bag/scarf) for donning shirt, pulling down shirt, and donning pants.  OT reiterating downgraded LB dressing task, with pt  lifting leg to advance bag under thigh to facilitate increased ROM to aid in LB dressing.  Pt reporting pain in L upper arm at deltoid with abduction, but not when attempting to reach overhead for simulated UB dressing. Coordination: engaged in flipping cards with focus on big hand opening and flipping of cards with amplitude, completing bilaterally.  Pt demonstrating increased difficulty with picking up cards from table top with RUE. Self-care: pt asking about alternative or adaptive nail clippers with OT providing recommendations of alternative styles.   02/13/24 Large amplitude: engaged in modified LSVT exercises with focus on reaching forwards, down to floor, overhead, and out to side.  OT providing demonstration and cues for amplitude and cues for foot placement prior to activity for increased BOS.  Pt continues to have most difficulty with overhead and out to side reaching.   Large amplitude: engaged in reaching outside BOS and across midline to vertical poles to facilitate increased reach and weight shifting outside BOS and across midline to aid in aspects of dressing, reaching for phone while supine in bed, and aiding in pulling up/down covers on bed.  Engaged in PNF pattern reaching with ball in BUE incorporating trunk rotation and reaching across midline.  Pt benefiting from demonstration and cues for technique and increased weight shifting.   PATIENT EDUCATION: Education details: Supine ROM to carry over to ADLs Person educated: Patient and Spouse Education method: Explanation and Verbal cues Education comprehension: verbalized understanding and needs further education  HOME EXERCISE PROGRAM: TBD  GOALS: Goals reviewed with patient? Yes  SHORT TERM GOALS: Target date: 02/20/24  Pt will be independent in PD specific HEP. Baseline: 02/21/24: ongoing Goal status: in progress  2.  Pt and spouse will report understanding of adaptive equipment and/or adaptive strategies to increase  ease with ADLs/IADLs. Baseline:  02/21/24: ongoing - educated on arm, arm, head technique with donning shirt Goal status: in progress  LONG TERM GOALS: Target date: 03/19/24  Pt will demonstrate improved ease with feeding as evidenced by decreasing PPT#2 (self feeding) by 5 secs with use of AE PRN. Baseline: 37.82 Goal status: in progress  2.  Pt will demonstrate increased B shoulder ROM to 120 or greater to increase ease  with UB dressing. Baseline:  Goal status: in progress  3.  Pt with demonstrate improved ease with bed mobility with use of AE and/or adaptive strategies PRN. Baseline:  Goal status: in progress  4.  Patient will report at least two-point increase in average PSFS score or at least three-point increase in a single activity score indicating functionally significant improvement given minimum detectable change. Baseline: 2.7 Goal status: in progress  ASSESSMENT:  CLINICAL IMPRESSION: Patient is a 73 y.o. male who was seen today for occupational therapy treatment for progression of PD, with pt requiring increased physical assistance and increased time with ADLs.  Pt benefiting from physical assistance to achieve supine position and then when returning to sitting position.  Pt demonstrating improved shoulder ROM in both flexion and abduction in supine position and then carryover with UB dressing task.  Pt does benefit from verbal and tactile cues for alignment and technique with UB ROM exercises.     PERFORMANCE DEFICITS: in functional skills including ADLs, coordination, ROM, strength, flexibility, Fine motor control, Gross motor control, balance, body mechanics, endurance, decreased knowledge of precautions, decreased knowledge of use of DME, and UE functional use and psychosocial skills including environmental adaptation and routines and behaviors.   IMPAIRMENTS: are limiting patient from ADLs, leisure, and social participation.    PLAN:  OT FREQUENCY: 1-2x/week  OT  DURATION: 8 weeks  PLANNED INTERVENTIONS: 97168 OT Re-evaluation, 97535 self care/ADL training, 02889 therapeutic exercise, 97530 therapeutic activity, 97112 neuromuscular re-education, 97035 ultrasound, functional mobility training, psychosocial skills training, energy conservation, coping strategies training, patient/family education, and DME and/or AE instructions  RECOMMENDED OTHER SERVICES: NA  CONSULTED AND AGREED WITH PLAN OF CARE: Patient and family member/caregiver  PLAN FOR NEXT SESSION: UB/ LB dressing with adaptive techniques and/or equipment, bed mobility, self-feeding with angled spoon, UB ROM/large amplitude   Jehiel Koepp, OTR/L 02/21/2024, 11:55 AM   [facility] at [facility] [address] [place], [place] [address] Phone # [phone] Fax # [phone]

## 2024-02-25 ENCOUNTER — Ambulatory Visit: Admitting: Occupational Therapy

## 2024-02-25 ENCOUNTER — Encounter: Payer: Self-pay | Admitting: Physical Therapy

## 2024-02-25 ENCOUNTER — Ambulatory Visit: Admitting: Physical Therapy

## 2024-02-25 DIAGNOSIS — R2681 Unsteadiness on feet: Secondary | ICD-10-CM

## 2024-02-25 DIAGNOSIS — M6281 Muscle weakness (generalized): Secondary | ICD-10-CM

## 2024-02-25 DIAGNOSIS — R2689 Other abnormalities of gait and mobility: Secondary | ICD-10-CM

## 2024-02-25 DIAGNOSIS — R293 Abnormal posture: Secondary | ICD-10-CM | POA: Diagnosis not present

## 2024-02-25 DIAGNOSIS — R29818 Other symptoms and signs involving the nervous system: Secondary | ICD-10-CM

## 2024-02-25 NOTE — Therapy (Signed)
 OUTPATIENT OCCUPATIONAL THERAPY [facility]  Treatment Note  Patient Name: Matthew [name] MRN: [id number] DOB:December 29, 1950, 73 y.o., male Today's Date: 02/25/2024  PCP: Okey Carlin Redbird, MD REFERRING PROVIDER: Tat, Asberry RAMAN, DO  END OF SESSION:  OT End of Session - 02/25/24 1324     Visit Number 6    Number of Visits 9    Date for Recertification  03/19/24    Authorization Type [employer]    OT Start Time 1320    OT Stop Time 1400    OT Time Calculation (min) 40 min               Past Medical History:  Diagnosis Date   Anxiety    Arthritis    GERD (gastroesophageal reflux disease)    occ remote history   Hypertension    Parkinson disease (HCC)    Prostate cancer (HCC)    Right knee pain    questionable meniscal tear   Seasonal allergies    Past Surgical History:  Procedure Laterality Date   COLONOSCOPY     Leg Laceration Repair  Left    Age 26   PELVIC LYMPH NODE DISSECTION Bilateral 03/22/2018   Procedure: PELVIC LYMPH NODE DISSECTION;  Surgeon: Devere Lonni Righter, MD;  Location: [facility];  Service: Urology;  Laterality: Bilateral;   PROSTATE BIOPSY     ROBOT ASSISTED LAPAROSCOPIC RADICAL PROSTATECTOMY N/A 03/22/2018   Procedure: XI ROBOTIC ASSISTED LAPAROSCOPIC PROSTATECTOMY;  Surgeon: Devere Lonni Righter, MD;  Location: [facility];  Service: Urology;  Laterality: N/A;   TOTAL KNEE ARTHROPLASTY Right 05/05/2019   Procedure: RIGHT TOTAL KNEE ARTHROPLASTY;  Surgeon: Liam Lerner, MD;  Location: [facility];  Service: Orthopedics;  Laterality: Right;   Patient Active Problem List   Diagnosis Date Noted   S/P TKR (total knee replacement), right 05/05/2019   Osteoarthritis of right knee 05/02/2019   Prostate cancer (HCC) 03/22/2018   Malignant neoplasm of prostate (HCC) 02/27/2018    ONSET DATE: referral date 11/19/23  REFERRING DIAG: G20.A2 (ICD-10-CM) - Parkinson's disease without dyskinesia, with fluctuating manifestations (HCC) R49.8  (ICD-10-CM) - Hypophonia  THERAPY DIAG:  Abnormal posture  Muscle weakness (generalized)  Other symptoms and signs involving the nervous system  Unsteadiness on feet  Rationale for Evaluation and Treatment: Rehabilitation  SUBJECTIVE:   SUBJECTIVE STATEMENT: Pt reports that he felt it was a little bit easier with UB dressing, donning BUE then over head.  Pt accompanied by: self and significant other (spouse)  PERTINENT HISTORY: PD, anxiety, arthritis, GERD, HTN, R TKR   PRECAUTIONS: Fall  WEIGHT BEARING RESTRICTIONS: No  PAIN:  Are you having pain? Yes: NPRS scale: 1 Pain location: L shoulder Pain description: pulling, moderate pain Aggravating factors: moving it into certain positions Relieving factors: avoiding those movements.    FALLS: Has patient fallen in last 6 months? Yes. Number of falls when he falls I can't get him up. A few falls; Has not fallen in the last 2 months  LIVING ENVIRONMENT: Lives with: lives with their spouse Lives in: House/apartment Stairs: 1 step down to screened porch with small portable ramp, 3 steps at the garage with 1 rail and wall Has following equipment at home: hospital bed, 4 wheeled walker, wheelchair, grab bars in the shower, BSC next to the shower for ease of transfers and dressing, arms on toilet, trapeze   PLOF: Requires assistive device for independence and Needs assistance with ADLs  PATIENT GOALS: strategies to increase ease with bed mobility and eating  OBJECTIVE:  Note: Objective measures were completed at Evaluation unless otherwise noted.  HAND DOMINANCE: Right  ADLs: Overall ADLs: Spouse is assisting with all aspects of bathing and dressing.  Reports that it will take upwards of an hour for bathing and dressing.   Transfers/ambulation related to ADLs: Increased use of wheelchair in the home. Still with use of 4 wheeled walker intermittently.  Pt with tendency to veer to the left with ambulation Eating: wife cuts  up food, gives a lot of finger foods.  Pt has tried a variety of adaptive utensils with minimal impact Grooming: utilizing electric toothbrush and water  pick for oral care, difficulty with brushing hair  UB Dressing: wife assisting for time LB Dressing: wife assisting due to decreased reach towards floor and for time Toileting: requires momentum to stand up from toilet or will reach towards door for leverage/momentum, reports struggle with hygiene.  Increased time spent on the toilet due to constipation Bathing: wife assisting for time Tub Shower transfers: wife provides supervision, handle is just inside door and pt able to hold on to it while stepping over small ledge  Equipment: Grab bars, Walk in shower, and Long handled sponge  IADLs: Spouse completes all shopping, meal prep, and housekeeping tasks. Community mobility: Relies on family or friends for transportation Medication management: pt fills pill box and is able to take correct dosages at correct time Financial management: wife completes and did prior  MOBILITY STATUS: increased use of wheelchair in the home and in the community, veers to Left with ambulation with 4 wheeled walker  POSTURE COMMENTS:  rounded shoulders, forward head, and weight shift left  ACTIVITY TOLERANCE: Activity tolerance: diminished  FUNCTIONAL OUTCOME MEASURES: Physical performance test: PPT#2 (simulated eating) 37.82 & PPT#4 (donning/doffing jacket): TBD    COORDINATION: Not assessed  UE ROM:   L shoulder flexion 105, abduction 60, pronation 70        R shoulder flexion 100, abduction 90, pronation 80   UE MMT:   grossly 3+/5   MUSCLE TONE: RUE: Mild and LUE: Mild  COGNITION: Overall cognitive status: Within functional limits for tasks assessed and slower processing and speech  OBSERVATIONS: Bradykinesia; Pt requiring min assist for anterior weight shift for sit > stand.  Pt veering to L while he was walking back to treatment room.                                                                                                                     TREATMENT DATE:  02/25/24 UB dressing: OT reiterated UB dressing technique with donning BUE and then pulling shirt overhead. Pt requiring increased time, still benefiting from assist to pull shirt fully over elbows and then to pull shirt down torso. Pt then requiring assistance to pull shirt over head when doffing, but then able to doff without additional assistance.   Supine UE ROM: OT providing demonstration and tactile cues for improved positioning/alignment for LUE and cues to maintain stretch 20-30 seconds for improved ROM and  stretch.  Pt frequently only completing with hold for ~10 seconds. - Open Book Chest Stretch  - Supine Shoulder Overhead Flexion with BUE together - Supine Shoulder Horizontal Abduction to/from Chest Self-feeding: engaged in scooping beans from bowl with standard spoon and built up handle.  Pt reporting that the larger bowl of spoon seems to increase ease with scooping but feels that the 2 utensils can be interchangeable.  Completed PPT #2 (self-feeding) with pt completing in 47.96 sec with built up handle and 42.07 sec with standard spoon.    02/21/24 Bed mobility: OT aiding pt in transitioning from sit > supine in therapy mat with pt managing UB and OT aiding in lifting and positioning LB.  During transition from supine > sit OT providing support at UB to push up into partial sitting with pt then able to advance legs from EOB to floor.   Supine UE ROM: OT providing demonstration and tactile cues for improved positioning/alignment for LUE and cues to maintain stretch 20-30 seconds for improved ROM and stretch - Open Book Chest Stretch  - Supine Shoulder Overhead Flexion with BUE together - Supine Shoulder Horizontal Abduction to/from Chest UB dressing: OT educating on threading BUE first and then pulling shirt overhead.  Pt demonstrating improved technique when  threading BUE first, still benefiting from assist to pull shirt fully over elbows and then to pull shirt down torso. Pt demonstrating improved shoulder flexion and ability to pull shirt up to and over head with min assist to pull down over back of head and torso.  Pt then requiring assistance to pull shirt over head but then able to doff without additional assistance.  Encouraged pt and spouse to attempt in this manner to both increase patient participation and decrease burden of care.   02/19/24 Medication: pt requiring increased time to retrieve medication tin from slot on Rollator, OT ultimately retrieving for pt with cues to shift it to a different pocket allowing for a larger opening.  Pt opening and retrieving meds from small tin with improved ease compared to bag during previous session. Pt with increased time when sipping water . Large amplitude/ Bag exercises: engaged in large amplitude UE movements with abduction followed by movements to more closely simulate ADLs (using a plastic bag/scarf) for donning shirt, pulling down shirt, and donning pants.  OT reiterating downgraded LB dressing task, with pt lifting leg to advance bag under thigh to facilitate increased ROM to aid in LB dressing.  Pt reporting pain in L upper arm at deltoid with abduction, but not when attempting to reach overhead for simulated UB dressing. Coordination: engaged in flipping cards with focus on big hand opening and flipping of cards with amplitude, completing bilaterally.  Pt demonstrating increased difficulty with picking up cards from table top with RUE. Self-care: pt asking about alternative or adaptive nail clippers with OT providing recommendations of alternative styles.   02/13/24 Large amplitude: engaged in modified LSVT exercises with focus on reaching forwards, down to floor, overhead, and out to side.  OT providing demonstration and cues for amplitude and cues for foot placement prior to activity for increased  BOS.  Pt continues to have most difficulty with overhead and out to side reaching.   Large amplitude: engaged in reaching outside BOS and across midline to vertical poles to facilitate increased reach and weight shifting outside BOS and across midline to aid in aspects of dressing, reaching for phone while supine in bed, and aiding in pulling up/down covers on bed.  Engaged in PNF pattern reaching with ball in BUE incorporating trunk rotation and reaching across midline.  Pt benefiting from demonstration and cues for technique and increased weight shifting.   PATIENT EDUCATION: Education details: Supine ROM to carry over to ADLs Person educated: Patient and Spouse Education method: Explanation and Verbal cues Education comprehension: verbalized understanding and needs further education  HOME EXERCISE PROGRAM: TBD  GOALS: Goals reviewed with patient? Yes  SHORT TERM GOALS: Target date: 02/20/24  Pt will be independent in PD specific HEP. Baseline: 02/21/24: ongoing Goal status: in progress  2.  Pt and spouse will report understanding of adaptive equipment and/or adaptive strategies to increase ease with ADLs/IADLs. Baseline:  02/21/24: ongoing - educated on arm, arm, head technique with donning shirt Goal status: in progress  LONG TERM GOALS: Target date: 03/19/24  Pt will demonstrate improved ease with feeding as evidenced by decreasing PPT#2 (self feeding) by 5 secs with use of AE PRN. Baseline: 37.82 Goal status: in progress  2.  Pt will demonstrate increased B shoulder ROM to 120 or greater to increase ease with UB dressing. Baseline:  Goal status: in progress  3.  Pt with demonstrate improved ease with bed mobility with use of AE and/or adaptive strategies PRN. Baseline:  Goal status: in progress  4.  Patient will report at least two-point increase in average PSFS score or at least three-point increase in a single activity score indicating functionally significant  improvement given minimum detectable change. Baseline: 2.7 Goal status: in progress  ASSESSMENT:  CLINICAL IMPRESSION: Patient is a 73 y.o. male who was seen today for occupational therapy treatment for progression of PD, with pt requiring increased physical assistance and increased time with ADLs.  Pt benefiting from physical assistance to achieve supine position and then when returning to sitting position.  Pt demonstrating improved shoulder ROM in both flexion and abduction in supine position and then carryover with UB dressing task.  Pt does benefit from verbal and tactile cues for alignment and technique with UB ROM exercises.     PERFORMANCE DEFICITS: in functional skills including ADLs, coordination, ROM, strength, flexibility, Fine motor control, Gross motor control, balance, body mechanics, endurance, decreased knowledge of precautions, decreased knowledge of use of DME, and UE functional use and psychosocial skills including environmental adaptation and routines and behaviors.   IMPAIRMENTS: are limiting patient from ADLs, leisure, and social participation.    PLAN:  OT FREQUENCY: 1-2x/week  OT DURATION: 8 weeks  PLANNED INTERVENTIONS: 97168 OT Re-evaluation, 97535 self care/ADL training, 02889 therapeutic exercise, 97530 therapeutic activity, 97112 neuromuscular re-education, 97035 ultrasound, functional mobility training, psychosocial skills training, energy conservation, coping strategies training, patient/family education, and DME and/or AE instructions  RECOMMENDED OTHER SERVICES: NA  CONSULTED AND AGREED WITH PLAN OF CARE: Patient and family member/caregiver  PLAN FOR NEXT SESSION: UB/ LB dressing with adaptive techniques and/or equipment, bed mobility, self-feeding with angled spoon, UB ROM/large amplitude   Nazaire Cordial, OTR/L 02/25/2024, 1:25 PM   [facility] [address] [place], [place] [address] Phone #  [phone] Fax # [phone]

## 2024-02-25 NOTE — Therapy (Signed)
 OUTPATIENT PHYSICAL THERAPY NEURO TREATMENT NOTE   Patient Name: Matthew [name] MRN: [id number] DOB:[birth year], 73 y.o., male Today's Date: 02/25/2024   PCP: Okey Carlin Redbird, MD REFERRING PROVIDER: Tat, Asberry RAMAN, DO   END OF SESSION:  PT End of Session - 02/25/24 1406     Visit Number 6    Number of Visits 10    Date for Recertification  03/07/24    Authorization Type HTA    Progress Note Due on Visit 10    PT Start Time 1404    PT Stop Time 1445    PT Time Calculation (min) 41 min    Equipment Utilized During Treatment Gait belt    Activity Tolerance Patient tolerated treatment well    Behavior During Therapy WFL for tasks assessed/performed              Past Medical History:  Diagnosis Date   Anxiety    Arthritis    GERD (gastroesophageal reflux disease)    occ remote history   Hypertension    Parkinson disease (HCC)    Prostate cancer (HCC)    Right knee pain    questionable meniscal tear   Seasonal allergies    Past Surgical History:  Procedure Laterality Date   COLONOSCOPY     Leg Laceration Repair  Left    Age 74   PELVIC LYMPH NODE DISSECTION Bilateral 03/22/2018   Procedure: PELVIC LYMPH NODE DISSECTION;  Surgeon: Devere Lonni Righter, MD;  Location: [facility];  Service: Urology;  Laterality: Bilateral;   PROSTATE BIOPSY     ROBOT ASSISTED LAPAROSCOPIC RADICAL PROSTATECTOMY N/A 03/22/2018   Procedure: XI ROBOTIC ASSISTED LAPAROSCOPIC PROSTATECTOMY;  Surgeon: Devere Lonni Righter, MD;  Location: [facility];  Service: Urology;  Laterality: N/A;   TOTAL KNEE ARTHROPLASTY Right 05/05/2019   Procedure: RIGHT TOTAL KNEE ARTHROPLASTY;  Surgeon: Liam Lerner, MD;  Location: [facility];  Service: Orthopedics;  Laterality: Right;   Patient Active Problem List   Diagnosis Date Noted   S/P TKR (total knee replacement), right 05/05/2019   Osteoarthritis of right knee 05/02/2019   Prostate cancer (HCC) 03/22/2018   Malignant neoplasm of prostate (HCC)  02/27/2018    ONSET DATE: MD referral 11/19/2023  REFERRING DIAG:  G20.A2 (ICD-10-CM) - Parkinson's disease without dyskinesia, with fluctuating manifestations (HCC)  R49.8 (ICD-10-CM) - Hypophonia    THERAPY DIAG:  Muscle weakness (generalized)  Unsteadiness on feet  Other abnormalities of gait and mobility  Rationale for Evaluation and Treatment: Rehabilitation  SUBJECTIVE:  SUBJECTIVE STATEMENT: Doing okay.  Tried the exercises one time on Saturday morning. Pt accompanied by: significant other  PERTINENT HISTORY: Parkinson's, R TKR, prostate Ca, HTN  PAIN:  No PRECAUTIONS: Fall and Other: hx of neurogenic orthostatic hypotension  RED FLAGS: None   WEIGHT BEARING RESTRICTIONS: No  FALLS: Has patient fallen in last 6 months? Several falls; less falls since hospital bed; may 1 fall every 1-2 months Feels like he may lose balance backwards and wife provides very close supervision  LIVING ENVIRONMENT: Lives with: lives with their family Lives in: House/apartment Stairs: 2-3 steps to enter Has following equipment at home: Vannie - 2 wheeled, Environmental consultant - 4 wheeled, Wheelchair (manual), and hospital bed and manual w/c and transport w/c; grab bars in shower. BSC, lift chair.  Has a lower extremity pedaler at home (since knee surgery)  PLOF: Needs assistance with gait, Needs assistance with transfers, and Leisure: does voice exercises; does PWR! Moves exercises (about 15-20 min/day), dance class online, chair ex on [employer] ([employer] and Be [employer])  PATIENT GOALS: To get a better exercise routine compared to what I'm doing now.  To try to figure out what he can do for exercise and posture.  OBJECTIVE:      TODAY'S TREATMENT: 02/25/2024 Activity Comments  Sit to stand from mat attempts Pt very stiff,  per report  Seated knee flex/ext, 10 reps Seated stagger stance foot placement switches, then out/in foot placement switches  With red therapy ball for warmup of BLEs  Sit to stand, 3 x 5 reps  From mat surface, better after warm up  TUG-3 trials 30.91 sec 32.13 sec  30.13 sec  4WW  Modified quadruped PWR moves, 10 reps  Reviewed HEP additions-good form with min cues  Standing at locked rollator: Marching in place, 2 x 5 reps Stagger stance position toe taps x 10   Gait with 4WW, 60 ft x 2, then 50 ft, with cues for foot clearance Better foot clearance/less shuffling with cues     Access Code: [email or web address] URL: [email or web address] Date: 02/11/2024 Prepared by: [facility]  [facility] - [facility]  Exercises - Seated Flexion Stretch with Swiss Ball  - 1-2 x daily - 7 x weekly - 1 sets - 5 reps - Seated Thoracic Flexion and Rotation with Swiss Ball  - 1-2 x daily - 7 x weekly - 1 sets - 5 reps - Seated Hamstring Stretch  - 2 x daily - 7 x weekly - 1 sets - 3 reps - Wall Quarter Squat with Swiss Ball  - 1 x daily - 7 x weekly - 3 sets - 10 reps - Serratus Activation at [employer] with [employer] and Resistance Band  - 1 x daily - 7 x weekly - 3 sets - 10 reps  PATIENT EDUCATION: Education details: Reviewed HEP and discussed importance of warm-up movements in varied positions to try to offset stiffness Person educated: Patient Education method: Explanation, Demonstration, and Handouts Education comprehension: verbalized understanding and returned demonstration  ----------------------------------------------------- Note: Objective measures were completed at Evaluation unless otherwise noted.  DIAGNOSTIC FINDINGS: NA for this episode  COGNITION: Overall cognitive status: Within functional limits for tasks assessed and slowed response time   POSTURE: rounded shoulders, forward head, posterior pelvic tilt, flexed trunk , and weight shift left  LOWER EXTREMITY  ROM:     Active  Right Eval Left Eval  Hip flexion    Hip extension    Hip abduction    Hip adduction  Hip internal rotation    Hip external rotation    Knee flexion    Knee extension -35 -40  Ankle dorsiflexion    Ankle plantarflexion    Ankle inversion    Ankle eversion     (Blank rows = not tested)  LOWER EXTREMITY MMT:    MMT Right Eval Left Eval  Hip flexion 4 4  Hip extension    Hip abduction 4 4  Hip adduction 4 4  Hip internal rotation    Hip external rotation    Knee flexion 4 4  Knee extension 4 4  Ankle dorsiflexion 4 4  Ankle plantarflexion    Ankle inversion    Ankle eversion    (Blank rows = not tested)  BED MOBILITY:  Not tested  TRANSFERS: Sit to stand: CGA  Assistive device utilized: None     Stand to sit: CGA  Assistive device utilized: hands to reach to chair      GAIT: Findings: Gait Characteristics: step to pattern, decreased step length- Right, decreased step length- Left, knee flexed in stance- Right, knee flexed in stance- Left, shuffling, festinating, lateral lean- Left, poor foot clearance- Right, and poor foot clearance- Left, Distance walked: 40 ft, Assistive device utilized:Walker - 4 wheeled, Level of assistance: CGA and Min A, and Comments: shuffling/ festination increased with turns   FUNCTIONAL TESTS:  5 times sit to stand: 24.07 sec Timed up and go (TUG): 43.84 sec 10 meter walk test: 22.87 sec in 25 ft = 1.09 ft/sec Berg:  26/56 (Scores <45/56 indicate increased fall risk)                                                                                                                                 TREATMENT DATE: 01/23/2024    PATIENT EDUCATION: Education details: Eval results, POC; discussed benefits of OPPT versus HHPT Person educated: Patient and Spouse Education method: Explanation Education comprehension: verbalized understanding  HOME EXERCISE PROGRAM: Not yet initiated  GOALS: Goals reviewed with  patient? Yes  SHORT TERM GOALS: Target date: 02/15/2024  Pt will be supervision with HEP for improved strength, flexibility/decreased pain, balance. Baseline: Goal status: MET, 02/14/2024  2.  Pt will improve TUG score to less than or equal to 35 sec for decreased fall risk.  Baseline:  43 sec>30.13 sec at best with 4WW 02/25/2024  Goal status:  Met, 02/25/2024   LONG TERM GOALS: Target date: 03/07/2024  Pt will be supervision with progression of HEP for improved strength, balance, transfers. Baseline:  Goal status: INITIAL  2.  Pt will improve 5x sit<>stand to less than or equal to 18 sec to demonstrate improved functional strength and transfer efficiency.  Baseline: 24 sec Goal status: INITIAL  3.  Pt will improve gait velocity to at least 1.8 ft/sec for improved gait efficiency and safety. Baseline: 1.07 ft/sec Goal status: INITIAL  4.  Pt will improve Berg score to at least  38/56 to decrease fall risk. Baseline: 26/56 Goal status: INITIAL  5.  Pt will verbalize understanding of fall prevention in home environment.  Baseline:  Goal status: INITIAL  ASSESSMENT:  CLINICAL IMPRESSION: Pt presents today without new complaints. Skilled PT session focused on transfer practice, BLE flexibility/strengthening exercises and gait.  Assessed TUG, with pt improving TUG score to 30 sec and meeting STG 2.  He is very stiff to start session, and he does respond well to both sitting and standing exercises to warm up and initiate movement.  Review of modified quadruped PWR! Moves yielded good motion and pt appears more fluid and confident with full set of 10 reps of PWR! Moves than when we started several visits ago.  Pt will continue to benefit from skilled PT towards goals for improved functional mobility and decreased fall risk.   OBJECTIVE IMPAIRMENTS: Abnormal gait, decreased balance, decreased mobility, difficulty walking, decreased ROM, decreased strength, impaired flexibility, postural  dysfunction, and pain.   ACTIVITY LIMITATIONS: standing, transfers, bed mobility, bathing, toileting, dressing, self feeding, reach over head, hygiene/grooming, and locomotion level  PARTICIPATION LIMITATIONS: meal prep, cleaning, laundry, shopping, community activity, and community fitness  PERSONAL FACTORS: 3+ comorbidities: see above are also affecting patient's functional outcome.   REHAB POTENTIAL: Good  CLINICAL DECISION MAKING: Evolving/moderate complexity  EVALUATION COMPLEXITY: Moderate  PLAN:  PT FREQUENCY: 2x/wk for 3 weeks, then 1x/wk for 3 weeks (pt having to wait to come in until after dental procedure)  PT DURATION: 6 weeks plus eval  PLANNED INTERVENTIONS: 97750- Physical Performance Testing, 97110-Therapeutic exercises, 97530- Therapeutic activity, 97112- Neuromuscular re-education, 97535- Self Care, 02859- Manual therapy, [phone]- Gait training, Patient/Family education, and Balance training  PLAN FOR NEXT SESSION: Work on flexibility, sit to stand, gait technique, postural reeducation; SLS, standing balance   Reagen Haberman W., PT 02/25/2024, 4:52 PM  [facility] at [facility] [address], [address] [place], [place] [address] Phone # [phone] Fax # [phone]

## 2024-02-27 ENCOUNTER — Encounter: Payer: Self-pay | Admitting: Physical Therapy

## 2024-02-27 ENCOUNTER — Ambulatory Visit

## 2024-02-27 ENCOUNTER — Ambulatory Visit: Admitting: Physical Therapy

## 2024-02-27 DIAGNOSIS — R278 Other lack of coordination: Secondary | ICD-10-CM

## 2024-02-27 DIAGNOSIS — R293 Abnormal posture: Secondary | ICD-10-CM

## 2024-02-27 DIAGNOSIS — R2689 Other abnormalities of gait and mobility: Secondary | ICD-10-CM

## 2024-02-27 DIAGNOSIS — R2681 Unsteadiness on feet: Secondary | ICD-10-CM

## 2024-02-27 DIAGNOSIS — R29898 Other symptoms and signs involving the musculoskeletal system: Secondary | ICD-10-CM

## 2024-02-27 DIAGNOSIS — G20A2 Parkinson's disease without dyskinesia, with fluctuations: Secondary | ICD-10-CM

## 2024-02-27 DIAGNOSIS — R41840 Attention and concentration deficit: Secondary | ICD-10-CM

## 2024-02-27 DIAGNOSIS — M6281 Muscle weakness (generalized): Secondary | ICD-10-CM

## 2024-02-27 NOTE — Therapy (Signed)
 OUTPATIENT OCCUPATIONAL THERAPY PARKINSON'S  Treatment Note  Patient Name: Matthew [name] MRN: [id number] DOB:1951-02-16, 73 y.o., male Today's Date: 02/27/2024  PCP: Okey Carlin Redbird, MD REFERRING PROVIDER: Tat, Asberry RAMAN, DO  END OF SESSION:  OT End of Session - 02/27/24 1458     Visit Number 7    Number of Visits 9    Date for Recertification  03/19/24    Authorization Type [employer]    OT Start Time 1323    OT Stop Time 1401    OT Time Calculation (min) 38 min    Activity Tolerance Patient tolerated treatment well               Past Medical History:  Diagnosis Date   Anxiety    Arthritis    GERD (gastroesophageal reflux disease)    occ remote history   Hypertension    Parkinson disease (HCC)    Prostate cancer (HCC)    Right knee pain    questionable meniscal tear   Seasonal allergies    Past Surgical History:  Procedure Laterality Date   COLONOSCOPY     Leg Laceration Repair  Left    Age 32   PELVIC LYMPH NODE DISSECTION Bilateral 03/22/2018   Procedure: PELVIC LYMPH NODE DISSECTION;  Surgeon: Devere Lonni Righter, MD;  Location: [facility];  Service: Urology;  Laterality: Bilateral;   PROSTATE BIOPSY     ROBOT ASSISTED LAPAROSCOPIC RADICAL PROSTATECTOMY N/A 03/22/2018   Procedure: XI ROBOTIC ASSISTED LAPAROSCOPIC PROSTATECTOMY;  Surgeon: Devere Lonni Righter, MD;  Location: [facility];  Service: Urology;  Laterality: N/A;   TOTAL KNEE ARTHROPLASTY Right 05/05/2019   Procedure: RIGHT TOTAL KNEE ARTHROPLASTY;  Surgeon: Liam Lerner, MD;  Location: [facility];  Service: Orthopedics;  Laterality: Right;   Patient Active Problem List   Diagnosis Date Noted   S/P TKR (total knee replacement), right 05/05/2019   Osteoarthritis of right knee 05/02/2019   Prostate cancer (HCC) 03/22/2018   Malignant neoplasm of prostate (HCC) 02/27/2018    ONSET DATE: referral date 11/19/23  REFERRING DIAG: G20.A2 (ICD-10-CM) - Parkinson's disease without  dyskinesia, with fluctuating manifestations (HCC) R49.8 (ICD-10-CM) - Hypophonia  THERAPY DIAG:  Other lack of coordination  Muscle weakness (generalized)  Parkinson's disease without dyskinesia, with fluctuating manifestations (HCC)  Attention and concentration deficit  Other symptoms and signs involving the musculoskeletal system  Rationale for Evaluation and Treatment: Rehabilitation  SUBJECTIVE:   SUBJECTIVE STATEMENT: Pt reports that he felt it was a little bit easier with UB dressing, donning BUE then over head.  Pt accompanied by: self and significant other (spouse)  PERTINENT HISTORY: PD, anxiety, arthritis, GERD, HTN, R TKR   PRECAUTIONS: Fall  WEIGHT BEARING RESTRICTIONS: No  PAIN:  Are you having pain? Yes: NPRS scale: 1 Pain location: L shoulder Pain description: pulling, moderate pain Aggravating factors: moving it into certain positions Relieving factors: avoiding those movements.    FALLS: Has patient fallen in last 6 months? Yes. Number of falls when he falls I can't get him up. A few falls; Has not fallen in the last 2 months  LIVING ENVIRONMENT: Lives with: lives with their spouse Lives in: House/apartment Stairs: 1 step down to screened porch with small portable ramp, 3 steps at the garage with 1 rail and wall Has following equipment at home: hospital bed, 4 wheeled walker, wheelchair, grab bars in the shower, BSC next to the shower for ease of transfers and dressing, arms on toilet, trapeze   PLOF: Requires  assistive device for independence and Needs assistance with ADLs  PATIENT GOALS: strategies to increase ease with bed mobility and eating  OBJECTIVE:  Note: Objective measures were completed at Evaluation unless otherwise noted.  HAND DOMINANCE: Right  ADLs: Overall ADLs: Spouse is assisting with all aspects of bathing and dressing.  Reports that it will take upwards of an hour for bathing and dressing.   Transfers/ambulation related to  ADLs: Increased use of wheelchair in the home. Still with use of 4 wheeled walker intermittently.  Pt with tendency to veer to the left with ambulation Eating: wife cuts up food, gives a lot of finger foods.  Pt has tried a variety of adaptive utensils with minimal impact Grooming: utilizing electric toothbrush and water  pick for oral care, difficulty with brushing hair  UB Dressing: wife assisting for time LB Dressing: wife assisting due to decreased reach towards floor and for time Toileting: requires momentum to stand up from toilet or will reach towards door for leverage/momentum, reports struggle with hygiene.  Increased time spent on the toilet due to constipation Bathing: wife assisting for time Tub Shower transfers: wife provides supervision, handle is just inside door and pt able to hold on to it while stepping over small ledge  Equipment: Grab bars, Walk in shower, and Long handled sponge  IADLs: Spouse completes all shopping, meal prep, and housekeeping tasks. Community mobility: Relies on family or friends for transportation Medication management: pt fills pill box and is able to take correct dosages at correct time Financial management: wife completes and did prior  MOBILITY STATUS: increased use of wheelchair in the home and in the community, veers to Left with ambulation with 4 wheeled walker  POSTURE COMMENTS:  rounded shoulders, forward head, and weight shift left  ACTIVITY TOLERANCE: Activity tolerance: diminished  FUNCTIONAL OUTCOME MEASURES: Physical performance test: PPT#2 (simulated eating) 37.82 & PPT#4 (donning/doffing jacket): TBD    COORDINATION: Not assessed  UE ROM:   L shoulder flexion 105, abduction 60, pronation 70        R shoulder flexion 100, abduction 90, pronation 80   UE MMT:   grossly 3+/5   MUSCLE TONE: RUE: Mild and LUE: Mild  COGNITION: Overall cognitive status: Within functional limits for tasks assessed and slower processing and  speech  OBSERVATIONS: Bradykinesia; Pt requiring min assist for anterior weight shift for sit > stand.  Pt veering to L while he was walking back to treatment room.                                                                                                                    TREATMENT DATE:  02/27/24 Pt reports some difficulty with donning UB clothing with compensatory technique. Completed stand>sit CGA to mat table, EOB>supine Min A, with pt able to bridge hips and scoot towards middle of mat table. Completed chest openers with mod cueing for proper form, 2x10 reps of B shoulder flexion. Supine>seated edge of mat table Mod A. Pt educated in donning/doffing of  jacket, donning sleeves first then bringing overhead much like shirt donning technique. Pt was able to doff and don with increased time and Min A for minor adjustments. Pt educated in where to obtain angled spoon with built up handle via [place] (pt reports son has account and can purchase items as needed). Pt asked if [employer] had reachers of varying lengths, confirmed this and also educated pt in [employer] store usually having reachers in stock.   02/25/24 UB dressing: OT reiterated UB dressing technique with donning BUE and then pulling shirt overhead. Pt requiring increased time, still benefiting from assist to pull shirt fully over elbows and then to pull shirt down torso. Pt then requiring assistance to pull shirt over head when doffing, but then able to doff without additional assistance.   Supine UE ROM: OT providing demonstration and tactile cues for improved positioning/alignment for LUE and cues to maintain stretch 20-30 seconds for improved ROM and stretch.  Pt frequently only completing with hold for ~10 seconds. - Open Book Chest Stretch  - Supine Shoulder Overhead Flexion with BUE together - Supine Shoulder Horizontal Abduction to/from Chest Self-feeding: engaged in scooping beans from bowl with standard  spoon and built up handle.  Pt reporting that the larger bowl of spoon seems to increase ease with scooping but feels that the 2 utensils can be interchangeable.  Completed PPT #2 (self-feeding) with pt completing in 47.96 sec with built up handle and 42.07 sec with standard spoon.  OT reiterating modifying hand placement and choosing various types of utensils vs depth of bowl vs plate to increase ease with self-feeding.    02/21/24 Bed mobility: OT aiding pt in transitioning from sit > supine in therapy mat with pt managing UB and OT aiding in lifting and positioning LB.  During transition from supine > sit OT providing support at UB to push up into partial sitting with pt then able to advance legs from EOB to floor.   Supine UE ROM: OT providing demonstration and tactile cues for improved positioning/alignment for LUE and cues to maintain stretch 20-30 seconds for improved ROM and stretch - Open Book Chest Stretch  - Supine Shoulder Overhead Flexion with BUE together - Supine Shoulder Horizontal Abduction to/from Chest UB dressing: OT educating on threading BUE first and then pulling shirt overhead.  Pt demonstrating improved technique when threading BUE first, still benefiting from assist to pull shirt fully over elbows and then to pull shirt down torso. Pt demonstrating improved shoulder flexion and ability to pull shirt up to and over head with min assist to pull down over back of head and torso.  Pt then requiring assistance to pull shirt over head but then able to doff without additional assistance.  Encouraged pt and spouse to attempt in this manner to both increase patient participation and decrease burden of care.   PATIENT EDUCATION: Education details: Compensatory donning/doffing jacket, where to obtain reacher of desired length and angled spoon Person educated: Patient Education method: Explanation, Verbal cues, and Handouts Education comprehension: verbalized understanding and needs  further education  HOME EXERCISE PROGRAM: TBD  GOALS: Goals reviewed with patient? Yes  SHORT TERM GOALS: Target date: 02/20/24  Pt will be independent in PD specific HEP. Baseline: 02/21/24: ongoing Goal status: in progress  2.  Pt and spouse will report understanding of adaptive equipment and/or adaptive strategies to increase ease with ADLs/IADLs. Baseline:  02/21/24: ongoing - educated on arm, arm, head technique with donning shirt Goal status:  in progress  LONG TERM GOALS: Target date: 03/19/24  Pt will demonstrate improved ease with feeding as evidenced by decreasing PPT#2 (self feeding) by 5 secs with use of AE PRN. Baseline: 37.82 Goal status: in progress  2.  Pt will demonstrate increased B shoulder ROM to 120 or greater to increase ease with UB dressing. Baseline:  Goal status: in progress  3.  Pt with demonstrate improved ease with bed mobility with use of AE and/or adaptive strategies PRN. Baseline:  Goal status: in progress  4.  Patient will report at least two-point increase in average PSFS score or at least three-point increase in a single activity score indicating functionally significant improvement given minimum detectable change. Baseline: 2.7 Goal status: in progress  ASSESSMENT:  CLINICAL IMPRESSION: Patient is a 73 y.o. male who was seen today for occupational therapy treatment for progression of PD, with pt requiring increased physical assistance and increased time with ADLs.  Pt benefiting from min-mod physical assistance and increased time to achieve supine position and then when returning to sitting position.  Pt demonstrating improved shoulder ROM in both flexion and abduction in supine position, however continues to benefit from verbal and tactile cues for alignment and technique with UB ROM exercises.     PERFORMANCE DEFICITS: in functional skills including ADLs, coordination, ROM, strength, flexibility, Fine motor control, Gross motor control,  balance, body mechanics, endurance, decreased knowledge of precautions, decreased knowledge of use of DME, and UE functional use and psychosocial skills including environmental adaptation and routines and behaviors.   IMPAIRMENTS: are limiting patient from ADLs, leisure, and social participation.    PLAN:  OT FREQUENCY: 1-2x/week  OT DURATION: 8 weeks  PLANNED INTERVENTIONS: 97168 OT Re-evaluation, 97535 self care/ADL training, 02889 therapeutic exercise, 97530 therapeutic activity, 97112 neuromuscular re-education, 97035 ultrasound, functional mobility training, psychosocial skills training, energy conservation, coping strategies training, patient/family education, and DME and/or AE instructions  RECOMMENDED OTHER SERVICES: NA  CONSULTED AND AGREED WITH PLAN OF CARE: Patient and family member/caregiver  PLAN FOR NEXT SESSION: UB/ LB dressing with adaptive techniques and/or equipment, bed mobility, self-feeding with angled spoon, UB ROM/large amplitude  F/u on obtaining reacher and/or angled spoon    Rocky Dutch, OTR/L 02/27/2024, 2:59 PM   [facility] Rehab at [facility] [address], [address] [place], [place] [address] Phone # [phone] Fax # [phone]

## 2024-02-27 NOTE — Therapy (Signed)
 OUTPATIENT PHYSICAL THERAPY NEURO TREATMENT NOTE   Patient Name: Matthew [name] MRN: [id number] DOB:10-Aug-1950, 73 y.o., male Today's Date: 02/27/2024   PCP: Okey Carlin Redbird, MD REFERRING PROVIDER: Tat, Asberry RAMAN, DO   END OF SESSION:  PT End of Session - 02/27/24 1353     Visit Number 7    Number of Visits 10    Date for Recertification  03/07/24    Authorization Type HTA    Progress Note Due on Visit 10    PT Start Time 1404    PT Stop Time 1445    PT Time Calculation (min) 41 min    Equipment Utilized During Treatment Gait belt    Activity Tolerance Patient tolerated treatment well    Behavior During Therapy WFL for tasks assessed/performed              Past Medical History:  Diagnosis Date   Anxiety    Arthritis    GERD (gastroesophageal reflux disease)    occ remote history   Hypertension    Parkinson disease (HCC)    Prostate cancer (HCC)    Right knee pain    questionable meniscal tear   Seasonal allergies    Past Surgical History:  Procedure Laterality Date   COLONOSCOPY     Leg Laceration Repair  Left    Age 79   PELVIC LYMPH NODE DISSECTION Bilateral 03/22/2018   Procedure: PELVIC LYMPH NODE DISSECTION;  Surgeon: Devere Lonni Righter, MD;  Location: [facility];  Service: Urology;  Laterality: Bilateral;   PROSTATE BIOPSY     ROBOT ASSISTED LAPAROSCOPIC RADICAL PROSTATECTOMY N/A 03/22/2018   Procedure: XI ROBOTIC ASSISTED LAPAROSCOPIC PROSTATECTOMY;  Surgeon: Devere Lonni Righter, MD;  Location: [facility];  Service: Urology;  Laterality: N/A;   TOTAL KNEE ARTHROPLASTY Right 05/05/2019   Procedure: RIGHT TOTAL KNEE ARTHROPLASTY;  Surgeon: Liam Lerner, MD;  Location: [facility];  Service: Orthopedics;  Laterality: Right;   Patient Active Problem List   Diagnosis Date Noted   S/P TKR (total knee replacement), right 05/05/2019   Osteoarthritis of right knee 05/02/2019   Prostate cancer (HCC) 03/22/2018   Malignant neoplasm of prostate (HCC)  02/27/2018    ONSET DATE: MD referral 11/19/2023  REFERRING DIAG:  G20.A2 (ICD-10-CM) - Parkinson's disease without dyskinesia, with fluctuating manifestations (HCC)  R49.8 (ICD-10-CM) - Hypophonia    THERAPY DIAG:  Muscle weakness (generalized)  Abnormal posture  Unsteadiness on feet  Other abnormalities of gait and mobility  Rationale for Evaluation and Treatment: Rehabilitation  SUBJECTIVE:  SUBJECTIVE STATEMENT: Nothing new.  Did exercises yesterday.  Do have a seated stepper that goes out and in (haven't used in a while) Pt accompanied by: significant other  PERTINENT HISTORY: Parkinson's, R TKR, prostate Ca, HTN  PAIN:  No PRECAUTIONS: Fall and Other: hx of neurogenic orthostatic hypotension  RED FLAGS: None   WEIGHT BEARING RESTRICTIONS: No  FALLS: Has patient fallen in last 6 months? Several falls; less falls since hospital bed; may 1 fall every 1-2 months Feels like he may lose balance backwards and wife provides very close supervision  LIVING ENVIRONMENT: Lives with: lives with their family Lives in: House/apartment Stairs: 2-3 steps to enter Has following equipment at home: Vannie - 2 wheeled, Environmental consultant - 4 wheeled, Wheelchair (manual), and hospital bed and manual w/c and transport w/c; grab bars in shower. BSC, lift chair.  Has a lower extremity pedaler at home (since knee surgery)  PLOF: Needs assistance with gait, Needs assistance with transfers, and Leisure: does voice exercises; does PWR! Moves exercises (about 15-20 min/day), dance class online, chair ex on [employer] (Sit and Be Fit)  PATIENT GOALS: To get a better exercise routine compared to what I'm doing now.  To try to figure out what he can do for exercise and posture.  OBJECTIVE:     Begins session by taking his 2  pm medications  TODAY'S TREATMENT: 02/27/2024 Activity Comments  NuStep, Level 2-3, 4 extremities x 10 minutes Flexibility, aerobic warm up Cues for pacing, increased intensity  Sit to stand From mat, NuStep, chair, CGA  Standing at counter: Back step and weightshift 2 x 5 Side step and weightshift 2 x 5 Forward/Backwards walking with 4WW, 5 reps  Slowed pace, cues for foot clearance, posture  Standing at locked rollator: Marching in place, 2 x 5 reps Stagger stance position toe taps x 10   Gait 85 ft, then 30 ft x 2 with 4WW Min guard, cues for foot clearance        Access Code: [email or web address] URL: [email or web address] Date: 02/11/2024 Prepared by: [facility] - Outpatient  Rehab - [facility]  Exercises - Seated Flexion Stretch with Swiss Ball  - 1-2 x daily - 7 x weekly - 1 sets - 5 reps - Seated Thoracic Flexion and Rotation with Swiss Ball  - 1-2 x daily - 7 x weekly - 1 sets - 5 reps - Seated Hamstring Stretch  - 2 x daily - 7 x weekly - 1 sets - 3 reps - Wall Quarter Squat with Swiss Ball  - 1 x daily - 7 x weekly - 3 sets - 10 reps - Serratus Activation at [employer] with [employer] and Resistance Band  - 1 x daily - 7 x weekly - 3 sets - 10 reps  PATIENT EDUCATION: Education details: *optimal ways to walk backwards with 4WW-step and weigthshift; asked wife at end of session to come in for education on HEP updates next visit Person educated: Patient Education method: Explanation, Demonstration, and Handouts Education comprehension: verbalized understanding and returned demonstration  ----------------------------------------------------- Note: Objective measures were completed at Evaluation unless otherwise noted.  DIAGNOSTIC FINDINGS: NA for this episode  COGNITION: Overall cognitive status: Within functional limits for tasks assessed and slowed response time   POSTURE: rounded shoulders, forward head, posterior pelvic tilt, flexed trunk , and weight shift  left  LOWER EXTREMITY ROM:     Active  Right Eval Left Eval  Hip flexion    Hip extension    Hip abduction  Hip adduction    Hip internal rotation    Hip external rotation    Knee flexion    Knee extension -35 -40  Ankle dorsiflexion    Ankle plantarflexion    Ankle inversion    Ankle eversion     (Blank rows = not tested)  LOWER EXTREMITY MMT:    MMT Right Eval Left Eval  Hip flexion 4 4  Hip extension    Hip abduction 4 4  Hip adduction 4 4  Hip internal rotation    Hip external rotation    Knee flexion 4 4  Knee extension 4 4  Ankle dorsiflexion 4 4  Ankle plantarflexion    Ankle inversion    Ankle eversion    (Blank rows = not tested)  BED MOBILITY:  Not tested  TRANSFERS: Sit to stand: CGA  Assistive device utilized: None     Stand to sit: CGA  Assistive device utilized: hands to reach to chair      GAIT: Findings: Gait Characteristics: step to pattern, decreased step length- Right, decreased step length- Left, knee flexed in stance- Right, knee flexed in stance- Left, shuffling, festinating, lateral lean- Left, poor foot clearance- Right, and poor foot clearance- Left, Distance walked: 40 ft, Assistive device utilized:Walker - 4 wheeled, Level of assistance: CGA and Min A, and Comments: shuffling/ festination increased with turns   FUNCTIONAL TESTS:  5 times sit to stand: 24.07 sec Timed up and go (TUG): 43.84 sec 10 meter walk test: 22.87 sec in 25 ft = 1.09 ft/sec Berg:  26/56 (Scores <45/56 indicate increased fall risk)                                                                                                                                 TREATMENT DATE: 01/23/2024    PATIENT EDUCATION: Education details: Eval results, POC; discussed benefits of OPPT versus HHPT Person educated: Patient and Spouse Education method: Explanation Education comprehension: verbalized understanding  HOME EXERCISE PROGRAM: Not yet  initiated  GOALS: Goals reviewed with patient? Yes  SHORT TERM GOALS: Target date: 02/15/2024  Pt will be supervision with HEP for improved strength, flexibility/decreased pain, balance. Baseline: Goal status: MET, 02/14/2024  2.  Pt will improve TUG score to less than or equal to 35 sec for decreased fall risk.  Baseline:  43 sec>30.13 sec at best with 4WW 02/25/2024  Goal status:  Met, 02/25/2024   LONG TERM GOALS: Target date: 03/07/2024  Pt will be supervision with progression of HEP for improved strength, balance, transfers. Baseline:  Goal status: INITIAL  2.  Pt will improve 5x sit<>stand to less than or equal to 18 sec to demonstrate improved functional strength and transfer efficiency.  Baseline: 24 sec Goal status: INITIAL  3.  Pt will improve gait velocity to at least 1.8 ft/sec for improved gait efficiency and safety. Baseline: 1.07 ft/sec Goal status: INITIAL  4.  Pt will improve  Berg score to at least 38/56 to decrease fall risk. Baseline: 26/56 Goal status: INITIAL  5.  Pt will verbalize understanding of fall prevention in home environment.  Baseline:  Goal status: INITIAL  ASSESSMENT:  CLINICAL IMPRESSION: Pt presents today with no new complaints; he does want to try to work on backwards balance.  Skilled PT session focused on aerobic warm up and flexibility with intervals on NuStep, then standing balance with focus on posterior stepping.  He needs UE support and feels unsteady with backwards directions, improves with repetition and cues for foot clearance and wider BOS.  Worked on forward/back walking with [phone] and again, he improves with repetitions and cues.  He is progressing well, despite some fatigue and stiffness at beginning of session.  OBJECTIVE IMPAIRMENTS: Abnormal gait, decreased balance, decreased mobility, difficulty walking, decreased ROM, decreased strength, impaired flexibility, postural dysfunction, and pain.   ACTIVITY LIMITATIONS: standing,  transfers, bed mobility, bathing, toileting, dressing, self feeding, reach over head, hygiene/grooming, and locomotion level  PARTICIPATION LIMITATIONS: meal prep, cleaning, laundry, shopping, community activity, and community fitness  PERSONAL FACTORS: 3+ comorbidities: see above are also affecting patient's functional outcome.   REHAB POTENTIAL: Good  CLINICAL DECISION MAKING: Evolving/moderate complexity  EVALUATION COMPLEXITY: Moderate  PLAN:  PT FREQUENCY: 2x/wk for 3 weeks, then 1x/wk for 3 weeks (pt having to wait to come in until after dental procedure)  PT DURATION: 6 weeks plus eval  PLANNED INTERVENTIONS: 97750- Physical Performance Testing, 97110-Therapeutic exercises, 97530- Therapeutic activity, 97112- Neuromuscular re-education, 97535- Self Care, 02859- Manual therapy, [phone]- Gait training, Patient/Family education, and Balance training  PLAN FOR NEXT SESSION: Wife to come in next visit-add side step and back step to HEP; Check LTGs and recert to extend POC to cover remaining scheduled visits.  Work on flexibility, sit to stand, gait technique, postural reeducation; SLS, standing balance   Meryem Haertel W., PT 02/27/2024, 3:49 PM  [facility] at [facility] [address] [place], [address] [place], [place] [address] Phone # [phone] Fax # [phone]

## 2024-02-27 NOTE — Therapy (Deleted)
 OUTPATIENT OCCUPATIONAL THERAPY PARKINSON'S  Treatment Note  Patient Name: Matthew [name] MRN: [id number] DOB:Nov 10, 1950, 73 y.o., male Today's Date: 02/27/2024  PCP: Okey Carlin Redbird, MD REFERRING PROVIDER: Tat, Asberry RAMAN, DO  END OF SESSION:         Past Medical History:  Diagnosis Date   Anxiety    Arthritis    GERD (gastroesophageal reflux disease)    occ remote history   Hypertension    Parkinson disease (HCC)    Prostate cancer (HCC)    Right knee pain    questionable meniscal tear   Seasonal allergies    Past Surgical History:  Procedure Laterality Date   COLONOSCOPY     Leg Laceration Repair  Left    Age 51   PELVIC LYMPH NODE DISSECTION Bilateral 03/22/2018   Procedure: PELVIC LYMPH NODE DISSECTION;  Surgeon: Devere Lonni Righter, MD;  Location: [facility];  Service: Urology;  Laterality: Bilateral;   PROSTATE BIOPSY     ROBOT ASSISTED LAPAROSCOPIC RADICAL PROSTATECTOMY N/A 03/22/2018   Procedure: XI ROBOTIC ASSISTED LAPAROSCOPIC PROSTATECTOMY;  Surgeon: Devere Lonni Righter, MD;  Location: [facility];  Service: Urology;  Laterality: N/A;   TOTAL KNEE ARTHROPLASTY Right 05/05/2019   Procedure: RIGHT TOTAL KNEE ARTHROPLASTY;  Surgeon: Liam Lerner, MD;  Location: [facility];  Service: Orthopedics;  Laterality: Right;   Patient Active Problem List   Diagnosis Date Noted   S/P TKR (total knee replacement), right 05/05/2019   Osteoarthritis of right knee 05/02/2019   Prostate cancer (HCC) 03/22/2018   Malignant neoplasm of prostate (HCC) 02/27/2018    ONSET DATE: referral date 11/19/23  REFERRING DIAG: G20.A2 (ICD-10-CM) - Parkinson's disease without dyskinesia, with fluctuating manifestations (HCC) R49.8 (ICD-10-CM) - Hypophonia  THERAPY DIAG:  Other lack of coordination  Muscle weakness (generalized)  Parkinson's disease without dyskinesia, with fluctuating manifestations (HCC)  Attention and concentration deficit  Other symptoms and signs  involving the musculoskeletal system  Rationale for Evaluation and Treatment: Rehabilitation  SUBJECTIVE:   SUBJECTIVE STATEMENT: Pt reports that he felt it was a little bit easier with UB dressing, donning BUE then over head.  Pt accompanied by: self and significant other (spouse)  PERTINENT HISTORY: PD, anxiety, arthritis, GERD, HTN, R TKR   PRECAUTIONS: Fall  WEIGHT BEARING RESTRICTIONS: No  PAIN:  Are you having pain? Yes: NPRS scale: 1 Pain location: L shoulder Pain description: pulling, moderate pain Aggravating factors: moving it into certain positions Relieving factors: avoiding those movements.    FALLS: Has patient fallen in last 6 months? Yes. Number of falls when he falls I can't get him up. A few falls; Has not fallen in the last 2 months  LIVING ENVIRONMENT: Lives with: lives with their spouse Lives in: House/apartment Stairs: 1 step down to screened porch with small portable ramp, 3 steps at the garage with 1 rail and wall Has following equipment at home: hospital bed, 4 wheeled walker, wheelchair, grab bars in the shower, BSC next to the shower for ease of transfers and dressing, arms on toilet, trapeze   PLOF: Requires assistive device for independence and Needs assistance with ADLs  PATIENT GOALS: strategies to increase ease with bed mobility and eating  OBJECTIVE:  Note: Objective measures were completed at Evaluation unless otherwise noted.  HAND DOMINANCE: Right  ADLs: Overall ADLs: Spouse is assisting with all aspects of bathing and dressing.  Reports that it will take upwards of an hour for bathing and dressing.   Transfers/ambulation related to ADLs: Increased use of  wheelchair in the home. Still with use of 4 wheeled walker intermittently.  Pt with tendency to veer to the left with ambulation Eating: wife cuts up food, gives a lot of finger foods.  Pt has tried a variety of adaptive utensils with minimal impact Grooming: utilizing electric  toothbrush and water  pick for oral care, difficulty with brushing hair  UB Dressing: wife assisting for time LB Dressing: wife assisting due to decreased reach towards floor and for time Toileting: requires momentum to stand up from toilet or will reach towards door for leverage/momentum, reports struggle with hygiene.  Increased time spent on the toilet due to constipation Bathing: wife assisting for time Tub Shower transfers: wife provides supervision, handle is just inside door and pt able to hold on to it while stepping over small ledge  Equipment: Grab bars, Walk in shower, and Long handled sponge  IADLs: Spouse completes all shopping, meal prep, and housekeeping tasks. Community mobility: Relies on family or friends for transportation Medication management: pt fills pill box and is able to take correct dosages at correct time Financial management: wife completes and did prior  MOBILITY STATUS: increased use of wheelchair in the home and in the community, veers to Left with ambulation with 4 wheeled walker  POSTURE COMMENTS:  rounded shoulders, forward head, and weight shift left  ACTIVITY TOLERANCE: Activity tolerance: diminished  FUNCTIONAL OUTCOME MEASURES: Physical performance test: PPT#2 (simulated eating) 37.82 & PPT#4 (donning/doffing jacket): TBD    COORDINATION: Not assessed  UE ROM:   L shoulder flexion 105, abduction 60, pronation 70        R shoulder flexion 100, abduction 90, pronation 80   UE MMT:   grossly 3+/5   MUSCLE TONE: RUE: Mild and LUE: Mild  COGNITION: Overall cognitive status: Within functional limits for tasks assessed and slower processing and speech  OBSERVATIONS: Bradykinesia; Pt requiring min assist for anterior weight shift for sit > stand.  Pt veering to L while he was walking back to treatment room.                                                                                                                    TREATMENT DATE:   02/25/24 UB dressing: OT reiterated UB dressing technique with donning BUE and then pulling shirt overhead. Pt requiring increased time, still benefiting from assist to pull shirt fully over elbows and then to pull shirt down torso. Pt then requiring assistance to pull shirt over head when doffing, but then able to doff without additional assistance.   Supine UE ROM: OT providing demonstration and tactile cues for improved positioning/alignment for LUE and cues to maintain stretch 20-30 seconds for improved ROM and stretch.  Pt frequently only completing with hold for ~10 seconds. - Open Book Chest Stretch  - Supine Shoulder Overhead Flexion with BUE together - Supine Shoulder Horizontal Abduction to/from Chest Self-feeding: engaged in scooping beans from bowl with standard spoon and built up handle.  Pt reporting that the larger bowl  of spoon seems to increase ease with scooping but feels that the 2 utensils can be interchangeable.  Completed PPT #2 (self-feeding) with pt completing in 47.96 sec with built up handle and 42.07 sec with standard spoon.  OT reiterating modifying hand placement and choosing various types of utensils vs depth of bowl vs plate to increase ease with self-feeding.    02/21/24 Bed mobility: OT aiding pt in transitioning from sit > supine in therapy mat with pt managing UB and OT aiding in lifting and positioning LB.  During transition from supine > sit OT providing support at UB to push up into partial sitting with pt then able to advance legs from EOB to floor.   Supine UE ROM: OT providing demonstration and tactile cues for improved positioning/alignment for LUE and cues to maintain stretch 20-30 seconds for improved ROM and stretch - Open Book Chest Stretch  - Supine Shoulder Overhead Flexion with BUE together - Supine Shoulder Horizontal Abduction to/from Chest UB dressing: OT educating on threading BUE first and then pulling shirt overhead.  Pt demonstrating improved  technique when threading BUE first, still benefiting from assist to pull shirt fully over elbows and then to pull shirt down torso. Pt demonstrating improved shoulder flexion and ability to pull shirt up to and over head with min assist to pull down over back of head and torso.  Pt then requiring assistance to pull shirt over head but then able to doff without additional assistance.  Encouraged pt and spouse to attempt in this manner to both increase patient participation and decrease burden of care.   02/19/24 Medication: pt requiring increased time to retrieve medication tin from slot on Rollator, OT ultimately retrieving for pt with cues to shift it to a different pocket allowing for a larger opening.  Pt opening and retrieving meds from small tin with improved ease compared to bag during previous session. Pt with increased time when sipping water . Large amplitude/ Bag exercises: engaged in large amplitude UE movements with abduction followed by movements to more closely simulate ADLs (using a plastic bag/scarf) for donning shirt, pulling down shirt, and donning pants.  OT reiterating downgraded LB dressing task, with pt lifting leg to advance bag under thigh to facilitate increased ROM to aid in LB dressing.  Pt reporting pain in L upper arm at deltoid with abduction, but not when attempting to reach overhead for simulated UB dressing. Coordination: engaged in flipping cards with focus on big hand opening and flipping of cards with amplitude, completing bilaterally.  Pt demonstrating increased difficulty with picking up cards from table top with RUE. Self-care: pt asking about alternative or adaptive nail clippers with OT providing recommendations of alternative styles.  PATIENT EDUCATION: Education details: Supine ROM to carry over to ADLs Person educated: Patient and Spouse Education method: Explanation and Verbal cues Education comprehension: verbalized understanding and needs further  education  HOME EXERCISE PROGRAM: TBD  GOALS: Goals reviewed with patient? Yes  SHORT TERM GOALS: Target date: 02/20/24  Pt will be independent in PD specific HEP. Baseline: 02/21/24: ongoing Goal status: in progress  2.  Pt and spouse will report understanding of adaptive equipment and/or adaptive strategies to increase ease with ADLs/IADLs. Baseline:  02/21/24: ongoing - educated on arm, arm, head technique with donning shirt Goal status: in progress  LONG TERM GOALS: Target date: 03/19/24  Pt will demonstrate improved ease with feeding as evidenced by decreasing PPT#2 (self feeding) by 5 secs with use of AE PRN. Baseline:  37.82 Goal status: in progress  2.  Pt will demonstrate increased B shoulder ROM to 120 or greater to increase ease with UB dressing. Baseline:  Goal status: in progress  3.  Pt with demonstrate improved ease with bed mobility with use of AE and/or adaptive strategies PRN. Baseline:  Goal status: in progress  4.  Patient will report at least two-point increase in average PSFS score or at least three-point increase in a single activity score indicating functionally significant improvement given minimum detectable change. Baseline: 2.7 Goal status: in progress  ASSESSMENT:  CLINICAL IMPRESSION: Patient is a 73 y.o. male who was seen today for occupational therapy treatment for progression of PD, with pt requiring increased physical assistance and increased time with ADLs.  Pt benefiting from min-mod physical assistance and increased time to achieve supine position and then when returning to sitting position.  Pt demonstrating improved shoulder ROM in both flexion and abduction in supine position, however continues to benefit from verbal and tactile cues for alignment and technique with UB ROM exercises.     PERFORMANCE DEFICITS: in functional skills including ADLs, coordination, ROM, strength, flexibility, Fine motor control, Gross motor control, balance,  body mechanics, endurance, decreased knowledge of precautions, decreased knowledge of use of DME, and UE functional use and psychosocial skills including environmental adaptation and routines and behaviors.   IMPAIRMENTS: are limiting patient from ADLs, leisure, and social participation.    PLAN:  OT FREQUENCY: 1-2x/week  OT DURATION: 8 weeks  PLANNED INTERVENTIONS: 97168 OT Re-evaluation, 97535 self care/ADL training, 02889 therapeutic exercise, 97530 therapeutic activity, 97112 neuromuscular re-education, 97035 ultrasound, functional mobility training, psychosocial skills training, energy conservation, coping strategies training, patient/family education, and DME and/or AE instructions  RECOMMENDED OTHER SERVICES: NA  CONSULTED AND AGREED WITH PLAN OF CARE: Patient and family member/caregiver  PLAN FOR NEXT SESSION: UB/ LB dressing with adaptive techniques and/or equipment, bed mobility, self-feeding with angled spoon, UB ROM/large amplitude   Rocky Dutch, OTR/L 02/27/2024, 1:27 PM   [facility] at [facility] [address] [place], [place] [address] Phone # [phone] Fax # [phone]

## 2024-03-03 ENCOUNTER — Other Ambulatory Visit (HOSPITAL_COMMUNITY): Payer: Self-pay

## 2024-03-03 ENCOUNTER — Other Ambulatory Visit: Payer: Self-pay | Admitting: Neurology

## 2024-03-03 ENCOUNTER — Ambulatory Visit: Admitting: Physical Therapy

## 2024-03-03 ENCOUNTER — Ambulatory Visit: Admitting: Occupational Therapy

## 2024-03-03 ENCOUNTER — Encounter: Payer: Self-pay | Admitting: Physical Therapy

## 2024-03-03 ENCOUNTER — Ambulatory Visit

## 2024-03-03 ENCOUNTER — Other Ambulatory Visit: Payer: Self-pay

## 2024-03-03 DIAGNOSIS — R2681 Unsteadiness on feet: Secondary | ICD-10-CM

## 2024-03-03 DIAGNOSIS — R29818 Other symptoms and signs involving the nervous system: Secondary | ICD-10-CM

## 2024-03-03 DIAGNOSIS — K117 Disturbances of salivary secretion: Secondary | ICD-10-CM

## 2024-03-03 DIAGNOSIS — R4701 Aphasia: Secondary | ICD-10-CM

## 2024-03-03 DIAGNOSIS — R278 Other lack of coordination: Secondary | ICD-10-CM

## 2024-03-03 DIAGNOSIS — R2689 Other abnormalities of gait and mobility: Secondary | ICD-10-CM

## 2024-03-03 DIAGNOSIS — M6281 Muscle weakness (generalized): Secondary | ICD-10-CM

## 2024-03-03 DIAGNOSIS — R471 Dysarthria and anarthria: Secondary | ICD-10-CM

## 2024-03-03 DIAGNOSIS — R293 Abnormal posture: Secondary | ICD-10-CM | POA: Diagnosis not present

## 2024-03-03 DIAGNOSIS — R1310 Dysphagia, unspecified: Secondary | ICD-10-CM

## 2024-03-03 DIAGNOSIS — R41841 Cognitive communication deficit: Secondary | ICD-10-CM

## 2024-03-03 MED ORDER — XEOMIN 100 UNITS IM SOLR
INTRAMUSCULAR | 3 refills | Status: AC
  Filled 2024-03-03: qty 1, fill #0
  Filled 2024-03-04: qty 1, 90d supply, fill #0
  Filled 2024-05-26 – 2024-05-29 (×3): qty 1, 90d supply, fill #1

## 2024-03-03 NOTE — Patient Instructions (Signed)
 Tips to reduce freezing episodes with standing or walking:  Stand tall with your feet wide (*Even from getting up from sitting*), so that you can rock and weight shift through your hips. Don't try to fight the freeze: if you begin taking slower, faster, smaller steps, STOP, get your posture tall, and RESET your posture and balance.  Take a deep breath before taking the BIG step to start again. March in place, with high knee stepping, to get started walking again. Use auditory cues:  Count out loud, think of a familiar tune or song or cadence, use pocket metronome, to use rhythm to get started walking again. Use visual cues:  Use a line to step over, use laser pointer line to step over, (using BIG steps) to start walking again. Use visual targets to keep your posture tall (look ahead and focus on an object or target at eye level). As you approach where your destination with walking, count your steps out loud and/or focus on your target with your eyes until you are fully there. Use appropriate assistive device, as advised by your physical therapist to assist with taking longer, consistent steps. *Take a back step and then forward step to get started with walking

## 2024-03-03 NOTE — Therapy (Signed)
 OUTPATIENT PHYSICAL THERAPY NEURO TREATMENT NOTE/RECERT/PROGRESS NOTE   Patient Name: Matthew [name] MRN: [id number] DOB:1950-11-06, 73 y.o., male Today's Date: 03/03/2024   PCP: Okey Carlin Redbird, MD REFERRING PROVIDER: Evonnie Asberry RAMAN, DO   Progress Note Reporting Period 01/23/2024 to 03/03/2024  See note below for Objective Data and Assessment of Progress/Goals.     END OF SESSION:  PT End of Session - 03/03/24 1351     Visit Number 8    Number of Visits 12    Date for Recertification  04/04/24    Authorization Type HTA    Progress Note Due on Visit 10    PT Start Time 1400    PT Stop Time 1444    PT Time Calculation (min) 44 min    Equipment Utilized During Treatment Gait belt    Activity Tolerance Patient tolerated treatment well    Behavior During Therapy WFL for tasks assessed/performed               Past Medical History:  Diagnosis Date   Anxiety    Arthritis    GERD (gastroesophageal reflux disease)    occ remote history   Hypertension    Parkinson disease (HCC)    Prostate cancer (HCC)    Right knee pain    questionable meniscal tear   Seasonal allergies    Past Surgical History:  Procedure Laterality Date   COLONOSCOPY     Leg Laceration Repair  Left    Age 26   PELVIC LYMPH NODE DISSECTION Bilateral 03/22/2018   Procedure: PELVIC LYMPH NODE DISSECTION;  Surgeon: Devere Lonni Righter, MD;  Location: [facility];  Service: Urology;  Laterality: Bilateral;   PROSTATE BIOPSY     ROBOT ASSISTED LAPAROSCOPIC RADICAL PROSTATECTOMY N/A 03/22/2018   Procedure: XI ROBOTIC ASSISTED LAPAROSCOPIC PROSTATECTOMY;  Surgeon: Devere Lonni Righter, MD;  Location: [facility];  Service: Urology;  Laterality: N/A;   TOTAL KNEE ARTHROPLASTY Right 05/05/2019   Procedure: RIGHT TOTAL KNEE ARTHROPLASTY;  Surgeon: Liam Lerner, MD;  Location: [facility];  Service: Orthopedics;  Laterality: Right;   Patient Active Problem List   Diagnosis Date Noted   S/P TKR (total  knee replacement), right 05/05/2019   Osteoarthritis of right knee 05/02/2019   Prostate cancer (HCC) 03/22/2018   Malignant neoplasm of prostate (HCC) 02/27/2018    ONSET DATE: MD referral 11/19/2023  REFERRING DIAG:  G20.A2 (ICD-10-CM) - Parkinson's disease without dyskinesia, with fluctuating manifestations (HCC)  R49.8 (ICD-10-CM) - Hypophonia    THERAPY DIAG:  Muscle weakness (generalized)  Abnormal posture  Unsteadiness on feet  Other abnormalities of gait and mobility  Rationale for Evaluation and Treatment: Rehabilitation  SUBJECTIVE:  SUBJECTIVE STATEMENT: Had a long dental appointment on Thursday and was very tiring.  Was tired and fell on Friday in the shower.  Hit head but no other injuries. Wife present in session today. Pt accompanied by: significant other  PERTINENT HISTORY: Parkinson's, R TKR, prostate Ca, HTN  PAIN:  No PRECAUTIONS: Fall and Other: hx of neurogenic orthostatic hypotension  RED FLAGS: None   WEIGHT BEARING RESTRICTIONS: No  FALLS: Has patient fallen in last 6 months? Several falls; less falls since hospital bed; may 1 fall every 1-2 months Feels like he may lose balance backwards and wife provides very close supervision  LIVING ENVIRONMENT: Lives with: lives with their family Lives in: House/apartment Stairs: 2-3 steps to enter Has following equipment at home: Vannie - 2 wheeled, Environmental consultant - 4 wheeled, Wheelchair (manual), and hospital bed and manual w/c and transport w/c; grab bars in shower. BSC, lift chair.  Has a lower extremity pedaler at home (since knee surgery)  PLOF: Needs assistance with gait, Needs assistance with transfers, and Leisure: does voice exercises; does PWR! Moves exercises (about 15-20 min/day), dance class online, chair ex on [employer]  (Sit and Be Fit)  PATIENT GOALS: To get a better exercise routine compared to what I'm doing now.  To try to figure out what he can do for exercise and posture.  OBJECTIVE:   Begins session by taking his 2 pm medications  TODAY'S TREATMENT: 03/03/2024 Activity Comments  FTSTS:  22.44 sec Improved from 24 sec  Berg:  NT   10 M walk:  18.69 sec  (1.75 ft/se Gait with back step>forward step to initiate gait (to offset freezing episodes) Improved from 1.09 ft/sec  Standing exercises at counter: Side step and weightshift x 10 Back step and weightshift x 10   Sit to stand with short distance gait/turns Cues for wide BOS, lateral weightshifting, take as few steps as possilbe              Access Code: [email or web address] URL: [email or web address] Date: 02/11/2024; 03/03/2024 Prepared by: [facility] - Outpatient  Rehab - [facility] PWR! Moves modified quadruped x 10 Side step and backward step at weightshift at counter, 2 x 5 reps Exercises - Seated Flexion Stretch with Swiss Ball  - 1-2 x daily - 7 x weekly - 1 sets - 5 reps - Seated Thoracic Flexion and Rotation with Swiss Ball  - 1-2 x daily - 7 x weekly - 1 sets - 5 reps - Seated Hamstring Stretch  - 2 x daily - 7 x weekly - 1 sets - 3 reps - Wall Quarter Squat with Swiss Ball  - 1 x daily - 7 x weekly - 3 sets - 10 reps - Serratus Activation at [employer] with [employer] and Resistance Band  - 1 x daily - 7 x weekly - 3 sets - 10 reps  PATIENT EDUCATION: Education details:  Updated/additions to HEP; tips to reduce freezing episodes with gait Person educated: Patient and Spouse Education method: Explanation, Demonstration, and Handouts Education comprehension: verbalized understanding and returned demonstration  ----------------------------------------------------- Note: Objective measures were completed at Evaluation unless otherwise noted.  DIAGNOSTIC FINDINGS: NA for this episode  COGNITION: Overall cognitive  status: Within functional limits for tasks assessed and slowed response time   POSTURE: rounded shoulders, forward head, posterior pelvic tilt, flexed trunk , and weight shift left  LOWER EXTREMITY ROM:     Active  Right Eval Left Eval  Hip flexion    Hip extension  Hip abduction    Hip adduction    Hip internal rotation    Hip external rotation    Knee flexion    Knee extension -35 -40  Ankle dorsiflexion    Ankle plantarflexion    Ankle inversion    Ankle eversion     (Blank rows = not tested)  LOWER EXTREMITY MMT:    MMT Right Eval Left Eval  Hip flexion 4 4  Hip extension    Hip abduction 4 4  Hip adduction 4 4  Hip internal rotation    Hip external rotation    Knee flexion 4 4  Knee extension 4 4  Ankle dorsiflexion 4 4  Ankle plantarflexion    Ankle inversion    Ankle eversion    (Blank rows = not tested)  BED MOBILITY:  Not tested  TRANSFERS: Sit to stand: CGA  Assistive device utilized: None     Stand to sit: CGA  Assistive device utilized: hands to reach to chair      GAIT: Findings: Gait Characteristics: step to pattern, decreased step length- Right, decreased step length- Left, knee flexed in stance- Right, knee flexed in stance- Left, shuffling, festinating, lateral lean- Left, poor foot clearance- Right, and poor foot clearance- Left, Distance walked: 40 ft, Assistive device utilized:Walker - 4 wheeled, Level of assistance: CGA and Min A, and Comments: shuffling/ festination increased with turns   FUNCTIONAL TESTS:  5 times sit to stand: 24.07 sec Timed up and go (TUG): 43.84 sec 10 meter walk test: 22.87 sec in 25 ft = 1.09 ft/sec Berg:  26/56 (Scores <45/56 indicate increased fall risk)                                                                                                                                 TREATMENT DATE: 01/23/2024    PATIENT EDUCATION: Education details: Eval results, POC; discussed benefits of OPPT versus  HHPT Person educated: Patient and Spouse Education method: Explanation Education comprehension: verbalized understanding  HOME EXERCISE PROGRAM: Not yet initiated  GOALS: Goals reviewed with patient? Yes  SHORT TERM GOALS: Target date: 02/15/2024  Pt will be supervision with HEP for improved strength, flexibility/decreased pain, balance. Baseline: Goal status: MET, 02/14/2024  2.  Pt will improve TUG score to less than or equal to 35 sec for decreased fall risk.  Baseline:  43 sec>30.13 sec at best with 4WW 02/25/2024  Goal status:  Met, 02/25/2024   LONG TERM GOALS: Target date: 03/07/2024>04/04/2024 (UPDATED TARGET)  Pt will be supervision with progression of HEP for improved strength, balance, transfers. Baseline:  Goal status: IN PROGRESS  2.  Pt will improve 5x sit<>stand to less than or equal to 18 sec to demonstrate improved functional strength and transfer efficiency.  Baseline: 24 sec>22.44 sec 03/03/2024 Goal status: IN PROGRESS  3.  Pt will improve gait velocity to at least 1.8 ft/sec for improved gait efficiency and safety. Baseline:  1.07 ft/sec>1.74 ft/sec 03/03/2024 Goal status: IN PROGRESS  4.  Pt will improve Berg score to at least 38/56 to decrease fall risk. Baseline: 26/56 Goal status: IN PROGRESS   5.  Pt will verbalize understanding of fall prevention in home environment.  Baseline:  Goal status: IN PROGRESS  ASSESSMENT:  CLINICAL IMPRESSION: Pt presents today and subjectively reports he is moving some better and posture is some better.  He did have a fall over the weekend after being very tired from prolonged dental procedure; wife was present when he had the fall-no LOC or injury. Skilled PT session focused on assessing LTGs, with pt meeting progressing towards LTGs; he has improved with FTSTS and with gait velocity scores, just not to goal level.  *Of note, pt did not start PT visits until several weeks after eval due to dental procedures, so he has  not technically been seen for the length of his initial POC yet.  Recert completed today to cover remaining appointments to continue to address posture, strength, balance for optimal functional mobility and decreased fall risk.   OBJECTIVE IMPAIRMENTS: Abnormal gait, decreased balance, decreased mobility, difficulty walking, decreased ROM, decreased strength, impaired flexibility, postural dysfunction, and pain.   ACTIVITY LIMITATIONS: standing, transfers, bed mobility, bathing, toileting, dressing, self feeding, reach over head, hygiene/grooming, and locomotion level  PARTICIPATION LIMITATIONS: meal prep, cleaning, laundry, shopping, community activity, and community fitness  PERSONAL FACTORS: 3+ comorbidities: see above are also affecting patient's functional outcome.   REHAB POTENTIAL: Good  CLINICAL DECISION MAKING: Evolving/moderate complexity  EVALUATION COMPLEXITY: Moderate  PLAN:  PT FREQUENCY: 1x/week  PT DURATION: 4 weeks following recert 03/03/2024  PLANNED INTERVENTIONS: 97750- Physical Performance Testing, 97110-Therapeutic exercises, 97530- Therapeutic activity, 97112- Neuromuscular re-education, 97535- Self Care, [id number]- Manual therapy, [phone]- Gait training, Patient/Family education, and Balance training  PLAN FOR NEXT SESSION: Ask about side step and backwards step added to HEP.  Ask about adding additional appts through 11/14 or is pt ready to wrap up PT by end of October? Work on flexibility, sit to stand, gait technique, postural reeducation; SLS, standing balance   Delmus Warwick W., PT 03/03/2024, 5:14 PM  [facility] at [facility] [address], [address] [place], [place] [address] Phone # [phone] Fax # [phone]

## 2024-03-03 NOTE — Therapy (Signed)
 OUTPATIENT OCCUPATIONAL THERAPY [facility]  Treatment Note  Patient Name: Matthew [name] MRN: [id number] DOB:03-07-1951, 73 y.o., male Today's Date: 03/03/2024  PCP: Okey Carlin Redbird, MD REFERRING PROVIDER: Tat, Asberry RAMAN, DO  END OF SESSION:  OT End of Session - 03/03/24 1329     Visit Number 8    Number of Visits 9    Date for Recertification  03/19/24    Authorization Type [employer]    OT Start Time 1321    OT Stop Time 1400    OT Time Calculation (min) 39 min    Activity Tolerance Patient tolerated treatment well                Past Medical History:  Diagnosis Date   Anxiety    Arthritis    GERD (gastroesophageal reflux disease)    occ remote history   Hypertension    Parkinson disease (HCC)    Prostate cancer (HCC)    Right knee pain    questionable meniscal tear   Seasonal allergies    Past Surgical History:  Procedure Laterality Date   COLONOSCOPY     Leg Laceration Repair  Left    Age 52   PELVIC LYMPH NODE DISSECTION Bilateral 03/22/2018   Procedure: PELVIC LYMPH NODE DISSECTION;  Surgeon: Devere Lonni Righter, MD;  Location: [facility];  Service: Urology;  Laterality: Bilateral;   PROSTATE BIOPSY     ROBOT ASSISTED LAPAROSCOPIC RADICAL PROSTATECTOMY N/A 03/22/2018   Procedure: XI ROBOTIC ASSISTED LAPAROSCOPIC PROSTATECTOMY;  Surgeon: Devere Lonni Righter, MD;  Location: [facility];  Service: Urology;  Laterality: N/A;   TOTAL KNEE ARTHROPLASTY Right 05/05/2019   Procedure: RIGHT TOTAL KNEE ARTHROPLASTY;  Surgeon: Liam Lerner, MD;  Location: [facility];  Service: Orthopedics;  Laterality: Right;   Patient Active Problem List   Diagnosis Date Noted   S/P TKR (total knee replacement), right 05/05/2019   Osteoarthritis of right knee 05/02/2019   Prostate cancer (HCC) 03/22/2018   Malignant neoplasm of prostate (HCC) 02/27/2018    ONSET DATE: referral date 11/19/23  REFERRING DIAG: G20.A2 (ICD-10-CM) - Parkinson's disease without  dyskinesia, with fluctuating manifestations (HCC) R49.8 (ICD-10-CM) - Hypophonia  THERAPY DIAG:  Muscle weakness (generalized)  Other lack of coordination  Other symptoms and signs involving the nervous system  Unsteadiness on feet  Rationale for Evaluation and Treatment: Rehabilitation  SUBJECTIVE:   SUBJECTIVE STATEMENT: Pt's spouse reports that he had a 3 hour dental procedure Thursday afternoon and has another appt coming up this Thurs.  She also reports that he had a fall getting in to the shower on Saturday, they had to get his son-in-law to get him up.  Pt accompanied by: self and significant other (spouse)  PERTINENT HISTORY: PD, anxiety, arthritis, GERD, HTN, R TKR   PRECAUTIONS: Fall  WEIGHT BEARING RESTRICTIONS: No  PAIN:  Are you having pain? No  FALLS: Has patient fallen in last 6 months? Yes. Number of falls when he falls I can't get him up. A few falls; Has not fallen in the last 2 months  LIVING ENVIRONMENT: Lives with: lives with their spouse Lives in: House/apartment Stairs: 1 step down to screened porch with small portable ramp, 3 steps at the garage with 1 rail and wall Has following equipment at home: hospital bed, 4 wheeled walker, wheelchair, grab bars in the shower, BSC next to the shower for ease of transfers and dressing, arms on toilet, trapeze   PLOF: Requires assistive device for independence and Needs assistance  with ADLs  PATIENT GOALS: strategies to increase ease with bed mobility and eating  OBJECTIVE:  Note: Objective measures were completed at Evaluation unless otherwise noted.  HAND DOMINANCE: Right  ADLs: Overall ADLs: Spouse is assisting with all aspects of bathing and dressing.  Reports that it will take upwards of an hour for bathing and dressing.   Transfers/ambulation related to ADLs: Increased use of wheelchair in the home. Still with use of 4 wheeled walker intermittently.  Pt with tendency to veer to the left with  ambulation Eating: wife cuts up food, gives a lot of finger foods.  Pt has tried a variety of adaptive utensils with minimal impact Grooming: utilizing electric toothbrush and water  pick for oral care, difficulty with brushing hair  UB Dressing: wife assisting for time LB Dressing: wife assisting due to decreased reach towards floor and for time Toileting: requires momentum to stand up from toilet or will reach towards door for leverage/momentum, reports struggle with hygiene.  Increased time spent on the toilet due to constipation Bathing: wife assisting for time Tub Shower transfers: wife provides supervision, handle is just inside door and pt able to hold on to it while stepping over small ledge  Equipment: Grab bars, Walk in shower, and Long handled sponge  IADLs: Spouse completes all shopping, meal prep, and housekeeping tasks. Community mobility: Relies on family or friends for transportation Medication management: pt fills pill box and is able to take correct dosages at correct time Financial management: wife completes and did prior  MOBILITY STATUS: increased use of wheelchair in the home and in the community, veers to Left with ambulation with 4 wheeled walker  POSTURE COMMENTS:  rounded shoulders, forward head, and weight shift left  ACTIVITY TOLERANCE: Activity tolerance: diminished  FUNCTIONAL OUTCOME MEASURES: Physical performance test: PPT#2 (simulated eating) 37.82 & PPT#4 (donning/doffing jacket): TBD    COORDINATION: Not assessed  UE ROM:   L shoulder flexion 105, abduction 60, pronation 70        R shoulder flexion 100, abduction 90, pronation 80   UE MMT:   grossly 3+/5   MUSCLE TONE: RUE: Mild and LUE: Mild  COGNITION: Overall cognitive status: Within functional limits for tasks assessed and slower processing and speech  OBSERVATIONS: Bradykinesia; Pt requiring min assist for anterior weight shift for sit > stand.  Pt veering to L while he was walking  back to treatment room.                                                                                                                    TREATMENT DATE:  03/03/14 Large amplitude: engaged in reaching outside BOS to high and low targets while in sitting based on called colors.  Pt demonstrating most difficulty with high and low targets.  OT increasing challenge to reaching across midline for targets.  Pt still with most difficulty at high and low targets.   LB dressing: engaged in simulated LB dressing with scarf.  Pt with difficulty reaching towards  foot, therefore modified task to having pt lift thigh and advance scarf under thigh and back to focus on large amplitude when lifting legs to aid in LB dressing.  Attempted simulated LB dressing with gait belt and reacher, pt demonstrating decreased amplitude of lifting feet from floor therefore terminated task.  Pt dropping tissue during simulated LB dressing but able to pick up with use of reacher.    Self-feeding: LB dressing with and without reacher Bag exercises/   02/27/24 Pt reports some difficulty with donning UB clothing with compensatory technique. Completed stand>sit CGA to mat table, EOB>supine Min A, with pt able to bridge hips and scoot towards middle of mat table. Completed chest openers with mod cueing for proper form, 2x10 reps of B shoulder flexion. Supine>seated edge of mat table Mod A. Pt educated in donning/doffing of jacket, donning sleeves first then bringing overhead much like shirt donning technique. Pt was able to doff and don with increased time and Min A for minor adjustments. Pt educated in where to obtain angled spoon with built up handle via [place] (pt reports son has account and can purchase items as needed). Pt asked if [employer] had reachers of varying lengths, confirmed this and also educated pt in [employer] store usually having reachers in stock.   02/25/24 UB dressing: OT reiterated UB dressing  technique with donning BUE and then pulling shirt overhead. Pt requiring increased time, still benefiting from assist to pull shirt fully over elbows and then to pull shirt down torso. Pt then requiring assistance to pull shirt over head when doffing, but then able to doff without additional assistance.   Supine UE ROM: OT providing demonstration and tactile cues for improved positioning/alignment for LUE and cues to maintain stretch 20-30 seconds for improved ROM and stretch.  Pt frequently only completing with hold for ~10 seconds. - Open Book Chest Stretch  - Supine Shoulder Overhead Flexion with BUE together - Supine Shoulder Horizontal Abduction to/from Chest Self-feeding: engaged in scooping beans from bowl with standard spoon and built up handle.  Pt reporting that the larger bowl of spoon seems to increase ease with scooping but feels that the 2 utensils can be interchangeable.  Completed PPT #2 (self-feeding) with pt completing in 47.96 sec with built up handle and 42.07 sec with standard spoon.  OT reiterating modifying hand placement and choosing various types of utensils vs depth of bowl vs plate to increase ease with self-feeding.    02/21/24 Bed mobility: OT aiding pt in transitioning from sit > supine in therapy mat with pt managing UB and OT aiding in lifting and positioning LB.  During transition from supine > sit OT providing support at UB to push up into partial sitting with pt then able to advance legs from EOB to floor.   Supine UE ROM: OT providing demonstration and tactile cues for improved positioning/alignment for LUE and cues to maintain stretch 20-30 seconds for improved ROM and stretch - Open Book Chest Stretch  - Supine Shoulder Overhead Flexion with BUE together - Supine Shoulder Horizontal Abduction to/from Chest UB dressing: OT educating on threading BUE first and then pulling shirt overhead.  Pt demonstrating improved technique when threading BUE first, still benefiting  from assist to pull shirt fully over elbows and then to pull shirt down torso. Pt demonstrating improved shoulder flexion and ability to pull shirt up to and over head with min assist to pull down over back of head and torso.  Pt then  requiring assistance to pull shirt over head but then able to doff without additional assistance.  Encouraged pt and spouse to attempt in this manner to both increase patient participation and decrease burden of care.   PATIENT EDUCATION: Education details: Compensatory donning/doffing jacket, where to obtain reacher of desired length and angled spoon Person educated: Patient Education method: Explanation, Verbal cues, and Handouts Education comprehension: verbalized understanding and needs further education  HOME EXERCISE PROGRAM: TBD  GOALS: Goals reviewed with patient? Yes  SHORT TERM GOALS: Target date: 02/20/24  Pt will be independent in PD specific HEP. Baseline: 02/21/24: ongoing Goal status: in progress  2.  Pt and spouse will report understanding of adaptive equipment and/or adaptive strategies to increase ease with ADLs/IADLs. Baseline:  02/21/24: ongoing - educated on arm, arm, head technique with donning shirt Goal status: in progress  LONG TERM GOALS: Target date: 03/19/24  Pt will demonstrate improved ease with feeding as evidenced by decreasing PPT#2 (self feeding) by 5 secs with use of AE PRN. Baseline: 37.82 Goal status: in progress  2.  Pt will demonstrate increased B shoulder ROM to 120 or greater to increase ease with UB dressing. Baseline:  Goal status: in progress  3.  Pt with demonstrate improved ease with bed mobility with use of AE and/or adaptive strategies PRN. Baseline:  Goal status: in progress  4.  Patient will report at least two-point increase in average PSFS score or at least three-point increase in a single activity score indicating functionally significant improvement given minimum detectable change. Baseline:  2.7 Goal status: in progress  ASSESSMENT:  CLINICAL IMPRESSION: Patient is a 73 y.o. male who was seen today for occupational therapy treatment for progression of PD, with pt requiring increased physical assistance and increased time with ADLs.  Pt benefiting from min-mod physical assistance and increased time to achieve supine position and then when returning to sitting position.  Pt demonstrating improved shoulder ROM in both flexion and abduction in supine position, however continues to benefit from verbal and tactile cues for alignment and technique with UB ROM exercises.     PERFORMANCE DEFICITS: in functional skills including ADLs, coordination, ROM, strength, flexibility, Fine motor control, Gross motor control, balance, body mechanics, endurance, decreased knowledge of precautions, decreased knowledge of use of DME, and UE functional use and psychosocial skills including environmental adaptation and routines and behaviors.   IMPAIRMENTS: are limiting patient from ADLs, leisure, and social participation.    PLAN:  OT FREQUENCY: 1-2x/week  OT DURATION: 8 weeks  PLANNED INTERVENTIONS: 97168 OT Re-evaluation, 97535 self care/ADL training, 02889 therapeutic exercise, 97530 therapeutic activity, 97112 neuromuscular re-education, 97035 ultrasound, functional mobility training, psychosocial skills training, energy conservation, coping strategies training, patient/family education, and DME and/or AE instructions  RECOMMENDED OTHER SERVICES: NA  CONSULTED AND AGREED WITH PLAN OF CARE: Patient and family member/caregiver  PLAN FOR NEXT SESSION: UB/ LB dressing with adaptive techniques and/or equipment, bed mobility, self-feeding with angled spoon, UB ROM/large amplitude  F/u on obtaining reacher and/or angled spoon    Matthew [name], OTR/L 03/03/2024, 1:30 PM   [facility] at [facility] [address], [address] [place], [place] [address] Phone # [phone] Fax # [phone]

## 2024-03-03 NOTE — Patient Instructions (Addendum)
   What are we doing this week?  What's for dinner?  What do you want to watch?  Are the girls coming over?  Play some Dylan, Jurline Ivy Fleeta Elder  I'm getting ready for bed.

## 2024-03-03 NOTE — Therapy (Signed)
 OUTPATIENT SPEECH LANGUAGE PATHOLOGY [facility] TREATMENT   Patient Name: Matthew [name] MRN: [id number] DOB:12/18/1950, 73 y.o., male Today's Date: 03/03/2024  PCP: Okey Dunnings, MD REFERRING PROVIDER: Evonnie Stabs, DO  END OF SESSION:  End of Session - 03/03/24 1711     Visit Number 2    Number of Visits 13    Date for Recertification  04/04/24    SLP Start Time 1451    SLP Stop Time  1531    SLP Time Calculation (min) 40 min    Activity Tolerance Patient tolerated treatment well            Past Medical History:  Diagnosis Date   Anxiety    Arthritis    GERD (gastroesophageal reflux disease)    occ remote history   Hypertension    Parkinson disease (HCC)    Prostate cancer (HCC)    Right knee pain    questionable meniscal tear   Seasonal allergies    Past Surgical History:  Procedure Laterality Date   COLONOSCOPY     Leg Laceration Repair  Left    Age 5   PELVIC LYMPH NODE DISSECTION Bilateral 03/22/2018   Procedure: PELVIC LYMPH NODE DISSECTION;  Surgeon: Devere Lonni Righter, MD;  Location: [facility];  Service: Urology;  Laterality: Bilateral;   PROSTATE BIOPSY     ROBOT ASSISTED LAPAROSCOPIC RADICAL PROSTATECTOMY N/A 03/22/2018   Procedure: XI ROBOTIC ASSISTED LAPAROSCOPIC PROSTATECTOMY;  Surgeon: Devere Lonni Righter, MD;  Location: [facility];  Service: Urology;  Laterality: N/A;   TOTAL KNEE ARTHROPLASTY Right 05/05/2019   Procedure: RIGHT TOTAL KNEE ARTHROPLASTY;  Surgeon: Liam Lerner, MD;  Location: [facility];  Service: Orthopedics;  Laterality: Right;   Patient Active Problem List   Diagnosis Date Noted   S/P TKR (total knee replacement), right 05/05/2019   Osteoarthritis of right knee 05/02/2019   Prostate cancer (HCC) 03/22/2018   Malignant neoplasm of prostate (HCC) 02/27/2018    ONSET DATE: script dated 11/19/23  REFERRING DIAG: Parkinson's Disease  THERAPY DIAG:  Dysarthria and anarthria  Cognitive communication  deficit  Dysphagia, unspecified type  Aphasia  Rationale for Evaluation and Treatment: Rehabilitation  SUBJECTIVE:   SUBJECTIVE STATEMENT: When I do my loud /a/ Lupita it gets Advertising copywriter.  Pt accompanied by: significant other Matthew [name]  PERTINENT HISTORY: Pt well-known to this SLP from previous courses of ST. MBS in August 2023- results below. Pt's last ST course resulted in pt partially meeting goal for producing speech in upper 60s dB in short conversational segments.  PAIN:  Are you having pain? No  FALLS: Has patient fallen in last 6 months?  See PT evaluation for details  LIVING ENVIRONMENT: Lives with: lives with their family Lives in: House/apartment  PLOF:  Level of assistance: Needed assistance with ADLs, Needed assistance with IADLS  Employment: Retired  PATIENT GOALS: Improve loudness and swallowing  OBJECTIVE:   DIAGNOSTIC FINDINGS:  MBS 12-22-21 Patient presents with functional oropharyngeal swallowing without aspiration of any consistency tested. He does demonstrate minimal asymptomatic laryngeal penetration of thin liquid due to decreased timing of laryngeal closure. Chin tuck posture with use of straw effective to prevent penetration. Pharyngeal swallow is strong without retention. Oral transiting discoordination noted with pt swallowing barium tablet as he required 2nd bolus of thin to transit through oropharynx. Upon esophageal sweep, pt appeared with barium tablet retained in esophagus with pt awareness. Tablet appeared to transit distally after several boluses of liquids. After tablet had transited, pt continued with "phantom"  sensation to esophagus. Recommend pt continue diet as tolerated, using chin tuck with liquids if improves comfort, decreases cough. Also encouraged pt to assure maintains strength of voice, cough and expectoration for airway protection.   COGNITION: Overall cognitive status: Impaired Areas of impairment: Following commands and Problem  solving and slower processing resulting in more difficulty with verbal expression   MOTOR SPEECH: Overall motor speech: impaired Level of impairment: Word Respiration: thoracic breathing and clavicular breathing Phonation: breathy and low vocal intensity (see below for measurements) Resonance: WFL Articulation: Appears intact Intelligibility: Intelligible Effective technique: increased vocal intensity (See below)  ORAL MOTOR EXAMINATION: Overall status: Impaired:   Labial: Bilateral (ROM and Coordination) Lingual: Bilateral (ROM and Coordination) Facial: Bilateral (ROM) Comments: better ROM with visual and verbal cues  OBJECTIVE VOICE ASSESSMENT: Sustained loud ah maximum phonation time: 12.1 seconds Sustained loud ah loudness average: 86 dB Conversational loudness average: 66 dB Conversational loudness range: 58-74 dB Voice quality: breathy and low vocal intensity Stimulability trials: Given SLP modeling and rare min cues progressing to occasional mod, loudness average increased to average 68dB (range of 60 to 72); As stated, SLP req'd to use incr'd frequency cueing and more intense cueing as time progressed over 90 seconds of of simple conversation to mitigate volume decay to average 67 dB. Cognitive load min-moderately negatively impacted pt's ability to achieve WNL loudness in conversation.  Pt does report difficulty with swallowing and incr'd difficulty with drooling which would warrant MBS at this time. Pt's last MBS was October 2023.    PATIENT REPORTED OUTCOME MEASURES (PROM): Communication Effectiveness Survey: to be completed in first 2 sessions  TODAY'S TREATMENT:                                                                                                                                         DATE:  03/03/24: Pt needs PROM next session. SLP assisted pt in generating new set of everyday sentences. Today pt req'd mod A usually to improve loudness decay in  conversation over 8 seconds. SLP had pt third/last today so this may have played a role in this. Upon getting into lobby, pt's loudness decr'd immediately.   01/23/24: After evaluation, pt, SLP, and wife discussed pt's options for therapy at this time, with SLP collaborating with pt and wife what SLP thinks would be most profitable for pt given his input, wife's input, and the deficits highlighted with the evaluation completed today. A MBS is recommended.   PATIENT EDUCATION: Education details: see today's treatment Person educated: Patient and Spouse Education method: Explanation and Demonstration Education comprehension: verbalized understanding  HOME EXERCISE PROGRAM: TBD    GOALS: Goals reviewed with patient? No  SHORT TERM GOALS: Target date: 03/21/24-given date of first ST  Produce /a/ with average upper 80s dB over three sessions Baseline: Goal status: INITIAL  2.  pt will generate 3 minutes simple conversation with upper  60s dB average given occasional min A in 3 sessions  Baseline:  Goal status: INITIAL  3.  pt will improve verbal expression to a functional volume when cued by [name] in 3 sessions  Baseline:  Goal status: INITIAL  4.  If necessary, pt will use strategies provided during MBS with written cues in 3 sessions Baseline:  Goal status: INITIAL   LONG TERM GOALS: Target date: 04/04/24  pt will produce average upper 60s dB in 5 minutes simple conversation in 3 sessions  Baseline:  Goal status: INITIAL  2.   pt will improve verbal expression to a functional volume when cued by [name] in 3 sessions, after 03/21/24 Baseline:  Goal status: INITIAL  3.  pt will report fewer requests to repeat himself than prior to ST in 3 sessions Baseline:  Goal status: INITIAL  4.   Pt will use strategies for safer swallowing in 3 sessions with modified independence  Baseline:  Goal status: INITIAL  5.  Pt will score higher/better on PROM in the last 2 weeks of  therapy Baseline:  Goal status: INITIAL   ASSESSMENT:  CLINICAL IMPRESSION: Patient is a 73 y.o. male who was seen today for treatment of dysarthria and in light of stage V Parkinson's Disease. During eval, pt was able to improve loudness in simple conversation for 90 seconds with cues from SLP and thus is a good candidate for ST.  Pt would benefit from MBS, one will be suggested to referring MD.  OBJECTIVE IMPAIRMENTS: Objective impairments include executive functioning, aphasia, dysarthria, and dysphagia. These impairments are limiting patient from household responsibilities, ADLs/IADLs, effectively communicating at home and in community, and safety when swallowing.Factors affecting potential to achieve goals and functional outcome are none.. Patient will benefit from skilled SLP services to address above impairments and improve overall function.  REHAB POTENTIAL: Good  PLAN:  SLP FREQUENCY: 1-2x/week  SLP DURATION: 6 weeks  PLANNED INTERVENTIONS: Aspiration precaution training, Diet toleration management , Environmental controls, Cueing hierachy, Cognitive reorganization, Internal/external aids, Oral motor exercises, Functional tasks, Multimodal communication approach, SLP instruction and feedback, Compensatory strategies, and Patient/family education    [facility], CCC-SLP 03/03/2024, 5:11 PM

## 2024-03-04 ENCOUNTER — Other Ambulatory Visit: Payer: Self-pay | Admitting: Pharmacy Technician

## 2024-03-04 ENCOUNTER — Other Ambulatory Visit: Payer: Self-pay

## 2024-03-04 NOTE — Progress Notes (Signed)
 Specialty Pharmacy Refill Coordination Note  Matthew [name] is a 73 y.o. male contacted today regarding refills of specialty medication(s) IncobotulinumtoxinA  (Xeomin )  Spoke with Wife  Patient requested Courier to Provider Office   Delivery date: 03/05/24   Verified address: [facility] [address] [place]  [address]   Medication will be filled on 03/04/24.   Injection Appointment 03/14/24  Copay $250.00 Pt wife aware & agree.

## 2024-03-05 ENCOUNTER — Ambulatory Visit

## 2024-03-05 DIAGNOSIS — R293 Abnormal posture: Secondary | ICD-10-CM | POA: Diagnosis not present

## 2024-03-05 DIAGNOSIS — R1310 Dysphagia, unspecified: Secondary | ICD-10-CM

## 2024-03-05 DIAGNOSIS — R41841 Cognitive communication deficit: Secondary | ICD-10-CM

## 2024-03-05 DIAGNOSIS — R471 Dysarthria and anarthria: Secondary | ICD-10-CM

## 2024-03-05 DIAGNOSIS — R4701 Aphasia: Secondary | ICD-10-CM

## 2024-03-05 NOTE — Therapy (Signed)
 OUTPATIENT SPEECH LANGUAGE PATHOLOGY PARKINSON'S TREATMENT   Patient Name: Matthew [name] MRN: [id number] DOB:1951-02-06, 73 y.o., male Today's Date: 03/05/2024  PCP: Okey Dunnings, MD REFERRING PROVIDER: Evonnie Stabs, DO  END OF SESSION:  End of Session - 03/05/24 2330     Visit Number 3    Number of Visits 13    Date for Recertification  04/04/24    SLP Start Time 1534    SLP Stop Time  1615    SLP Time Calculation (min) 41 min    Activity Tolerance Patient tolerated treatment well            Past Medical History:  Diagnosis Date   Anxiety    Arthritis    GERD (gastroesophageal reflux disease)    occ remote history   Hypertension    Parkinson disease (HCC)    Prostate cancer (HCC)    Right knee pain    questionable meniscal tear   Seasonal allergies    Past Surgical History:  Procedure Laterality Date   COLONOSCOPY     Leg Laceration Repair  Left    Age 54   PELVIC LYMPH NODE DISSECTION Bilateral 03/22/2018   Procedure: PELVIC LYMPH NODE DISSECTION;  Surgeon: Devere Lonni Righter, MD;  Location: [facility];  Service: Urology;  Laterality: Bilateral;   PROSTATE BIOPSY     ROBOT ASSISTED LAPAROSCOPIC RADICAL PROSTATECTOMY N/A 03/22/2018   Procedure: XI ROBOTIC ASSISTED LAPAROSCOPIC PROSTATECTOMY;  Surgeon: Devere Lonni Righter, MD;  Location: [facility];  Service: Urology;  Laterality: N/A;   TOTAL KNEE ARTHROPLASTY Right 05/05/2019   Procedure: RIGHT TOTAL KNEE ARTHROPLASTY;  Surgeon: Liam Lerner, MD;  Location: [facility];  Service: Orthopedics;  Laterality: Right;   Patient Active Problem List   Diagnosis Date Noted   S/P TKR (total knee replacement), right 05/05/2019   Osteoarthritis of right knee 05/02/2019   Prostate cancer (HCC) 03/22/2018   Malignant neoplasm of prostate (HCC) 02/27/2018    ONSET DATE: script dated 11/19/23  REFERRING DIAG: Parkinson's Disease  THERAPY DIAG:  No diagnosis found.  Rationale for Evaluation and Treatment:  Rehabilitation  SUBJECTIVE:   SUBJECTIVE STATEMENT: When I do my loud /a/ Lupita it gets Advertising copywriter.  Pt accompanied by: significant other [name]  PERTINENT HISTORY: Pt well-known to this SLP from previous courses of ST. MBS in August 2023- results below. Pt's last ST course resulted in pt partially meeting goal for producing speech in upper 60s dB in short conversational segments.  PAIN:  Are you having pain? No  FALLS: Has patient fallen in last 6 months?  See PT evaluation for details  LIVING ENVIRONMENT: Lives with: lives with their family Lives in: House/apartment  PLOF:  Level of assistance: Needed assistance with ADLs, Needed assistance with IADLS  Employment: Retired  PATIENT GOALS: Improve loudness and swallowing  OBJECTIVE:   DIAGNOSTIC FINDINGS:  MBS 12-22-21 Patient presents with functional oropharyngeal swallowing without aspiration of any consistency tested. He does demonstrate minimal asymptomatic laryngeal penetration of thin liquid due to decreased timing of laryngeal closure. Chin tuck posture with use of straw effective to prevent penetration. Pharyngeal swallow is strong without retention. Oral transiting discoordination noted with pt swallowing barium tablet as he required 2nd bolus of thin to transit through oropharynx. Upon esophageal sweep, pt appeared with barium tablet retained in esophagus with pt awareness. Tablet appeared to transit distally after several boluses of liquids. After tablet had transited, pt continued with "phantom" sensation to esophagus. Recommend pt continue diet as tolerated, using  chin tuck with liquids if improves comfort, decreases cough. Also encouraged pt to assure maintains strength of voice, cough and expectoration for airway protection.      PATIENT REPORTED OUTCOME MEASURES (PROM): Communication Effectiveness Survey: to be completed in first 2 sessions  TODAY'S TREATMENT:                                                                                                                                          DATE:   03/05/24: PT NEEDS PROM NEXT SESSION. Today SLP used 15-30 second conversational segments to help pt make louder speech more of a habit. Pt req'd usual mod A for louder speech after 8 seconds. This decr'd to occasional min-mod A after 7 trials.  With pt's loud /a/ he produced average 88dB with occasional mod A for loudness and inhibiting loudness fade. Everyday sentences were produced with average 82dB. Pt was provided homework for loudness.  03/03/24: Pt needs PROM next session. SLP assisted pt in generating new set of everyday sentences. Today pt req'd mod A usually to improve loudness decay in conversation over 8 seconds. SLP had pt third/last today so this may have played a role in this. Upon getting into lobby, pt's loudness decr'd immediately.   01/23/24: After evaluation, pt, SLP, and wife discussed pt's options for therapy at this time, with SLP collaborating with pt and wife what SLP thinks would be most profitable for pt given his input, wife's input, and the deficits highlighted with the evaluation completed today. A MBS is recommended.   PATIENT EDUCATION: Education details: see today's treatment Person educated: Patient and Spouse Education method: Explanation and Demonstration Education comprehension: verbalized understanding  HOME EXERCISE PROGRAM: TBD    GOALS: Goals reviewed with patient? No  SHORT TERM GOALS: Target date: 03/21/24-given date of first ST  Produce /a/ with average upper 80s dB over three sessions Baseline: Goal status: INITIAL  2.  pt will generate 3 minutes simple conversation with upper 60s dB average given occasional min A in 3 sessions  Baseline:  Goal status: INITIAL  3.  pt will improve verbal expression to a functional volume when cued by [name] in 3 sessions  Baseline:  Goal status: INITIAL  4.  If necessary, pt will use strategies provided during MBS  with written cues in 3 sessions Baseline:  Goal status: INITIAL   LONG TERM GOALS: Target date: 04/04/24  pt will produce average upper 60s dB in 5 minutes simple conversation in 3 sessions  Baseline:  Goal status: INITIAL  2.   pt will improve verbal expression to a functional volume when cued by [name] in 3 sessions, after 03/21/24 Baseline:  Goal status: INITIAL  3.  pt will report fewer requests to repeat himself than prior to ST in 3 sessions Baseline:  Goal status: INITIAL  4.   Pt will use strategies for safer swallowing in 3  sessions with modified independence  Baseline:  Goal status: INITIAL  5.  Pt will score higher/better on PROM in the last 2 weeks of therapy Baseline:  Goal status: INITIAL   ASSESSMENT:  CLINICAL IMPRESSION: Patient is a 73 y.o. male who was seen today for treatment of dysarthria and in light of stage V Parkinson's Disease. See treatment date above for today's date for further details on today's session.  During eval, pt was able to improve loudness in simple conversation for 90 seconds with cues from SLP and thus is a good candidate for ST.  Pt would benefit from MBS, one will be suggested to referring MD.  OBJECTIVE IMPAIRMENTS: Objective impairments include executive functioning, aphasia, dysarthria, and dysphagia. These impairments are limiting patient from household responsibilities, ADLs/IADLs, effectively communicating at home and in community, and safety when swallowing.Factors affecting potential to achieve goals and functional outcome are none.. Patient will benefit from skilled SLP services to address above impairments and improve overall function.  REHAB POTENTIAL: Good  PLAN:  SLP FREQUENCY: 1-2x/week  SLP DURATION: 6 weeks  PLANNED INTERVENTIONS: Aspiration precaution training, Diet toleration management , Environmental controls, Cueing hierachy, Cognitive reorganization, Internal/external aids, Oral motor exercises, Functional  tasks, Multimodal communication approach, SLP instruction and feedback, Compensatory strategies, and Patient/family education    [facility], CCC-SLP 03/05/2024, 11:31 PM

## 2024-03-10 ENCOUNTER — Ambulatory Visit

## 2024-03-10 ENCOUNTER — Ambulatory Visit: Admitting: Occupational Therapy

## 2024-03-10 DIAGNOSIS — R4701 Aphasia: Secondary | ICD-10-CM

## 2024-03-10 DIAGNOSIS — R29818 Other symptoms and signs involving the nervous system: Secondary | ICD-10-CM

## 2024-03-10 DIAGNOSIS — R293 Abnormal posture: Secondary | ICD-10-CM

## 2024-03-10 DIAGNOSIS — R471 Dysarthria and anarthria: Secondary | ICD-10-CM

## 2024-03-10 DIAGNOSIS — R278 Other lack of coordination: Secondary | ICD-10-CM

## 2024-03-10 DIAGNOSIS — R41841 Cognitive communication deficit: Secondary | ICD-10-CM

## 2024-03-10 DIAGNOSIS — M6281 Muscle weakness (generalized): Secondary | ICD-10-CM

## 2024-03-10 DIAGNOSIS — R1310 Dysphagia, unspecified: Secondary | ICD-10-CM

## 2024-03-10 DIAGNOSIS — R2689 Other abnormalities of gait and mobility: Secondary | ICD-10-CM

## 2024-03-10 DIAGNOSIS — R2681 Unsteadiness on feet: Secondary | ICD-10-CM

## 2024-03-10 DIAGNOSIS — G20A2 Parkinson's disease without dyskinesia, with fluctuations: Secondary | ICD-10-CM

## 2024-03-10 NOTE — Therapy (Signed)
 OUTPATIENT PHYSICAL THERAPY NEURO TREATMENT NOTE   Patient Name: Matthew [name] MRN: [id number] DOB:05-10-1951, 73 y.o., male Today's Date: 03/10/2024   PCP: Okey Carlin Redbird, MD REFERRING PROVIDER: Tat, Asberry RAMAN, DO    END OF SESSION:  PT End of Session - 03/10/24 1400     Visit Number 9    Number of Visits 12    Date for Recertification  04/04/24    Authorization Type HTA    Progress Note Due on Visit 10    PT Start Time 1400    PT Stop Time 1445    PT Time Calculation (min) 45 min    Equipment Utilized During Treatment Gait belt    Activity Tolerance Patient tolerated treatment well    Behavior During Therapy WFL for tasks assessed/performed               Past Medical History:  Diagnosis Date   Anxiety    Arthritis    GERD (gastroesophageal reflux disease)    occ remote history   Hypertension    Parkinson disease (HCC)    Prostate cancer (HCC)    Right knee pain    questionable meniscal tear   Seasonal allergies    Past Surgical History:  Procedure Laterality Date   COLONOSCOPY     Leg Laceration Repair  Left    Age 50   PELVIC LYMPH NODE DISSECTION Bilateral 03/22/2018   Procedure: PELVIC LYMPH NODE DISSECTION;  Surgeon: Devere Lonni Righter, MD;  Location: [facility];  Service: Urology;  Laterality: Bilateral;   PROSTATE BIOPSY     ROBOT ASSISTED LAPAROSCOPIC RADICAL PROSTATECTOMY N/A 03/22/2018   Procedure: XI ROBOTIC ASSISTED LAPAROSCOPIC PROSTATECTOMY;  Surgeon: Devere Lonni Righter, MD;  Location: [facility];  Service: Urology;  Laterality: N/A;   TOTAL KNEE ARTHROPLASTY Right 05/05/2019   Procedure: RIGHT TOTAL KNEE ARTHROPLASTY;  Surgeon: Liam Lerner, MD;  Location: [facility];  Service: Orthopedics;  Laterality: Right;   Patient Active Problem List   Diagnosis Date Noted   S/P TKR (total knee replacement), right 05/05/2019   Osteoarthritis of right knee 05/02/2019   Prostate cancer (HCC) 03/22/2018   Malignant neoplasm of prostate (HCC)  02/27/2018    ONSET DATE: MD referral 11/19/2023  REFERRING DIAG:  G20.A2 (ICD-10-CM) - Parkinson's disease without dyskinesia, with fluctuating manifestations (HCC)  R49.8 (ICD-10-CM) - Hypophonia    THERAPY DIAG:  Muscle weakness (generalized)  Unsteadiness on feet  Abnormal posture  Other abnormalities of gait and mobility  Parkinson's disease without dyskinesia, with fluctuating manifestations (HCC)  Rationale for Evaluation and Treatment: Rehabilitation  SUBJECTIVE:  SUBJECTIVE STATEMENT: Doing ok.  Pt accompanied by: significant other  PERTINENT HISTORY: Parkinson's, R TKR, prostate Ca, HTN  PAIN:  No PRECAUTIONS: Fall and Other: hx of neurogenic orthostatic hypotension  RED FLAGS: None   WEIGHT BEARING RESTRICTIONS: No  FALLS: Has patient fallen in last 6 months? Several falls; less falls since hospital bed; may 1 fall every 1-2 months Feels like he may lose balance backwards and wife provides very close supervision  LIVING ENVIRONMENT: Lives with: lives with their family Lives in: House/apartment Stairs: 2-3 steps to enter Has following equipment at home: Vannie - 2 wheeled, Environmental consultant - 4 wheeled, Wheelchair (manual), and hospital bed and manual w/c and transport w/c; grab bars in shower. BSC, lift chair.  Has a lower extremity pedaler at home (since knee surgery)  PLOF: Needs assistance with gait, Needs assistance with transfers, and Leisure: does voice exercises; does PWR! Moves exercises (about 15-20 min/day), dance class online, chair ex on [employer] (Sit and Be Fit)  PATIENT GOALS: To get a better exercise routine compared to what I'm doing now.  To try to figure out what he can do for exercise and posture.  OBJECTIVE:    TODAY'S TREATMENT: 03/10/24 Activity Comments   Upper body strength -seated rows: 1x10,12,14--15#,20,25# -- RPE 5/10 -lat row: 1x10 30# 7/10 RPE. 1x12 and 1x14 35#  STS 3x10 BHR. 2x10 with speed  Step-ups 2x10 BHR, 8, tactile cues 75% of time to lumbar for trunk extension  Coordination/power 1x10 chest pass red med ball 1x10 under hand toss           Begins session by taking his 2 pm medications  TODAY'S TREATMENT: 03/03/2024 Activity Comments  FTSTS:  22.44 sec Improved from 24 sec  Berg:  NT   10 M walk:  18.69 sec  (1.75 ft/se Gait with back step>forward step to initiate gait (to offset freezing episodes) Improved from 1.09 ft/sec  Standing exercises at counter: Side step and weightshift x 10 Back step and weightshift x 10   Sit to stand with short distance gait/turns Cues for wide BOS, lateral weightshifting, take as few steps as possilbe              Access Code: [email or web address] URL: [email or web address] Date: 02/11/2024; 03/03/2024 Prepared by: [facility] - [facility] PWR! Moves modified quadruped x 10 Side step and backward step at weightshift at counter, 2 x 5 reps Exercises - Seated Flexion Stretch with Swiss Ball  - 1-2 x daily - 7 x weekly - 1 sets - 5 reps - Seated Thoracic Flexion and Rotation with Swiss Ball  - 1-2 x daily - 7 x weekly - 1 sets - 5 reps - Seated Hamstring Stretch  - 2 x daily - 7 x weekly - 1 sets - 3 reps - Wall Quarter Squat with Swiss Ball  - 1 x daily - 7 x weekly - 3 sets - 10 reps - Serratus Activation at [employer] with [employer] and Resistance Band  - 1 x daily - 7 x weekly - 3 sets - 10 reps  PATIENT EDUCATION: Education details:  Updated/additions to HEP; tips to reduce freezing episodes with gait Person educated: Patient and Spouse Education method: Explanation, Demonstration, and Handouts Education comprehension: verbalized understanding and returned demonstration  ----------------------------------------------------- Note: Objective  measures were completed at Evaluation unless otherwise noted.  DIAGNOSTIC FINDINGS: NA for this episode  COGNITION: Overall cognitive status: Within functional limits for tasks assessed and slowed response time  POSTURE: rounded shoulders, forward head, posterior pelvic tilt, flexed trunk , and weight shift left  LOWER EXTREMITY ROM:     Active  Right Eval Left Eval  Hip flexion    Hip extension    Hip abduction    Hip adduction    Hip internal rotation    Hip external rotation    Knee flexion    Knee extension -35 -40  Ankle dorsiflexion    Ankle plantarflexion    Ankle inversion    Ankle eversion     (Blank rows = not tested)  LOWER EXTREMITY MMT:    MMT Right Eval Left Eval  Hip flexion 4 4  Hip extension    Hip abduction 4 4  Hip adduction 4 4  Hip internal rotation    Hip external rotation    Knee flexion 4 4  Knee extension 4 4  Ankle dorsiflexion 4 4  Ankle plantarflexion    Ankle inversion    Ankle eversion    (Blank rows = not tested)  BED MOBILITY:  Not tested  TRANSFERS: Sit to stand: CGA  Assistive device utilized: None     Stand to sit: CGA  Assistive device utilized: hands to reach to chair      GAIT: Findings: Gait Characteristics: step to pattern, decreased step length- Right, decreased step length- Left, knee flexed in stance- Right, knee flexed in stance- Left, shuffling, festinating, lateral lean- Left, poor foot clearance- Right, and poor foot clearance- Left, Distance walked: 40 ft, Assistive device utilized:Walker - 4 wheeled, Level of assistance: CGA and Min A, and Comments: shuffling/ festination increased with turns   FUNCTIONAL TESTS:  5 times sit to stand: 24.07 sec Timed up and go (TUG): 43.84 sec 10 meter walk test: 22.87 sec in 25 ft = 1.09 ft/sec Berg:  26/56 (Scores <45/56 indicate increased fall risk)                                                                                                                                  TREATMENT DATE: 01/23/2024    PATIENT EDUCATION: Education details: Eval results, POC; discussed benefits of OPPT versus HHPT Person educated: Patient and Spouse Education method: Explanation Education comprehension: verbalized understanding  HOME EXERCISE PROGRAM: Not yet initiated  GOALS: Goals reviewed with patient? Yes  SHORT TERM GOALS: Target date: 02/15/2024  Pt will be supervision with HEP for improved strength, flexibility/decreased pain, balance. Baseline: Goal status: MET, 02/14/2024  2.  Pt will improve TUG score to less than or equal to 35 sec for decreased fall risk.  Baseline:  43 sec>30.13 sec at best with 4WW 02/25/2024  Goal status:  Met, 02/25/2024   LONG TERM GOALS: Target date: 03/07/2024>04/04/2024 (UPDATED TARGET)  Pt will be supervision with progression of HEP for improved strength, balance, transfers. Baseline:  Goal status: IN PROGRESS  2.  Pt will improve 5x sit<>stand to less than or equal to  18 sec to demonstrate improved functional strength and transfer efficiency.  Baseline: 24 sec>22.44 sec 03/03/2024 Goal status: IN PROGRESS  3.  Pt will improve gait velocity to at least 1.8 ft/sec for improved gait efficiency and safety. Baseline: 1.07 ft/sec>1.74 ft/sec 03/03/2024 Goal status: IN PROGRESS  4.  Pt will improve Berg score to at least 38/56 to decrease fall risk. Baseline: 26/56 Goal status: IN PROGRESS   5.  Pt will verbalize understanding of fall prevention in home environment.  Baseline:  Goal status: IN PROGRESS  ASSESSMENT:  CLINICAL IMPRESSION: Upper body resistance training via compound pulling movements for posterior chain strength/postural stability w/ tactile guidance 25% of time for form. Lower body power training to improve speed/explosive strength for sit to stand and stair negotiation using BUE support to enable faster pace and tactile cues for facilitation of trunk extension. Power/coordination activities with  medicine ball to improve postural stability against imposed perturbation w/ one instance of retro-LOB to mat table with throwing task. Skilled contact/guidance for facilitation needed throughout session due to bradykinesia and limited postural control   OBJECTIVE IMPAIRMENTS: Abnormal gait, decreased balance, decreased mobility, difficulty walking, decreased ROM, decreased strength, impaired flexibility, postural dysfunction, and pain.   ACTIVITY LIMITATIONS: standing, transfers, bed mobility, bathing, toileting, dressing, self feeding, reach over head, hygiene/grooming, and locomotion level  PARTICIPATION LIMITATIONS: meal prep, cleaning, laundry, shopping, community activity, and community fitness  PERSONAL FACTORS: 3+ comorbidities: see above are also affecting patient's functional outcome.   REHAB POTENTIAL: Good  CLINICAL DECISION MAKING: Evolving/moderate complexity  EVALUATION COMPLEXITY: Moderate  PLAN:  PT FREQUENCY: 1x/week  PT DURATION: 4 weeks following recert 03/03/2024  PLANNED INTERVENTIONS: 97750- Physical Performance Testing, 97110-Therapeutic exercises, 97530- Therapeutic activity, 97112- Neuromuscular re-education, 97535- Self Care, [id number]- Manual therapy, [phone]- Gait training, Patient/Family education, and Balance training  PLAN FOR NEXT SESSION: Ask about side step and backwards step added to HEP.  Ask about adding additional appts through 11/14 or is pt ready to wrap up PT by end of October? Work on flexibility, sit to stand, gait technique, postural reeducation; SLS, standing balance   Jonette MARLA Sandifer, PT 03/10/2024, 2:01 PM  [facility] at [facility] [address] [place], [address] [place], [place] [address] Phone # [phone] Fax # [phone]

## 2024-03-10 NOTE — Therapy (Signed)
 OUTPATIENT OCCUPATIONAL THERAPY [facility]  Treatment Note  Patient Name: Matthew [name] MRN: [id number] DOB:March 16, 1951, 73 y.o., male Today's Date: 03/10/2024  PCP: Okey Carlin Redbird, MD REFERRING PROVIDER: Tat, Asberry RAMAN, DO  END OF SESSION:  OT End of Session - 03/10/24 1343     Visit Number 9    Number of Visits 11    Date for Recertification  03/19/24    Authorization Type [employer]    OT Start Time 1323    OT Stop Time 1401    OT Time Calculation (min) 38 min    Activity Tolerance Patient tolerated treatment well                 Past Medical History:  Diagnosis Date   Anxiety    Arthritis    GERD (gastroesophageal reflux disease)    occ remote history   Hypertension    Parkinson disease (HCC)    Prostate cancer (HCC)    Right knee pain    questionable meniscal tear   Seasonal allergies    Past Surgical History:  Procedure Laterality Date   COLONOSCOPY     Leg Laceration Repair  Left    Age 86   PELVIC LYMPH NODE DISSECTION Bilateral 03/22/2018   Procedure: PELVIC LYMPH NODE DISSECTION;  Surgeon: Devere Lonni Righter, MD;  Location: [facility];  Service: Urology;  Laterality: Bilateral;   PROSTATE BIOPSY     ROBOT ASSISTED LAPAROSCOPIC RADICAL PROSTATECTOMY N/A 03/22/2018   Procedure: XI ROBOTIC ASSISTED LAPAROSCOPIC PROSTATECTOMY;  Surgeon: Devere Lonni Righter, MD;  Location: [facility];  Service: Urology;  Laterality: N/A;   TOTAL KNEE ARTHROPLASTY Right 05/05/2019   Procedure: RIGHT TOTAL KNEE ARTHROPLASTY;  Surgeon: Liam Lerner, MD;  Location: [facility];  Service: Orthopedics;  Laterality: Right;   Patient Active Problem List   Diagnosis Date Noted   S/P TKR (total knee replacement), right 05/05/2019   Osteoarthritis of right knee 05/02/2019   Prostate cancer (HCC) 03/22/2018   Malignant neoplasm of prostate (HCC) 02/27/2018    ONSET DATE: referral date 11/19/23  REFERRING DIAG: G20.A2 (ICD-10-CM) - Parkinson's disease 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 Pt unsure if he had slept on it.  Pt accompanied by: self and significant other (spouse)  PERTINENT HISTORY: PD, anxiety, arthritis, GERD, HTN, R TKR   PRECAUTIONS: Fall  WEIGHT BEARING RESTRICTIONS: No  PAIN:  Are you having pain? Yes: NPRS scale: not rated Pain location: L upper arm Pain description: soreness Aggravating factors: sleeping Relieving factors: unsure  FALLS: Has patient fallen in last 6 months? Yes. Number of falls when he falls I can't get him up. A few falls; Has not fallen in the last 2 months  LIVING ENVIRONMENT: Lives with: lives with their spouse Lives in: House/apartment Stairs: 1 step down to screened porch with small portable ramp, 3 steps at the garage with 1 rail and wall Has following equipment at home: hospital bed, 4 wheeled walker, wheelchair, grab bars in the shower, BSC next to the shower for ease of transfers and dressing, arms on toilet, trapeze   PLOF: Requires assistive device for independence and Needs assistance with ADLs  PATIENT GOALS: strategies to increase ease with bed mobility and eating  OBJECTIVE:  Note: Objective measures were completed at Evaluation unless otherwise noted.  HAND DOMINANCE: Right  ADLs: Overall ADLs: Spouse is assisting with all aspects of bathing and dressing.  Reports that it will take upwards of an hour for bathing and dressing.   Transfers/ambulation related to ADLs: Increased use of wheelchair in the home. Still with use of 4 wheeled walker intermittently.  Pt with tendency to veer to the left with ambulation Eating: wife cuts up food, gives a lot of  finger foods.  Pt has tried a variety of adaptive utensils with minimal impact Grooming: utilizing electric toothbrush and water  pick for oral care, difficulty with brushing hair  UB Dressing: wife assisting for time LB Dressing: wife assisting due to decreased reach towards floor and for time Toileting: requires momentum to stand up from toilet or will reach towards door for leverage/momentum, reports struggle with hygiene.  Increased time spent on the toilet due to constipation Bathing: wife assisting for time Tub Shower transfers: wife provides supervision, handle is just inside door and pt able to hold on to it while stepping over small ledge  Equipment: Grab bars, Walk in shower, and Long handled sponge  IADLs: Spouse completes all shopping, meal prep, and housekeeping tasks. Community mobility: Relies on family or friends for transportation Medication management: pt fills pill box and is able to take correct dosages at correct time Financial management: wife completes and did prior  MOBILITY STATUS: increased use of wheelchair in the home and in the community, veers to Left with ambulation with 4 wheeled walker  POSTURE COMMENTS:  rounded shoulders, forward head, and weight shift left  ACTIVITY TOLERANCE: Activity tolerance: diminished  FUNCTIONAL OUTCOME MEASURES: Physical performance test: PPT#2 (simulated eating) 37.82 & PPT#4 (donning/doffing jacket): TBD    COORDINATION: Not assessed  UE ROM:   L shoulder flexion 105, abduction 60, pronation 70        R shoulder flexion 100, abduction 90, pronation 80   UE MMT:   grossly 3+/5   MUSCLE TONE: RUE: Mild and LUE: Mild  COGNITION: Overall cognitive status: Within functional limits for tasks assessed and slower processing and speech  OBSERVATIONS: Bradykinesia; Pt requiring min assist for anterior weight shift for sit > stand.  Pt veering to L while he was walking back to treatment room.                                                                                                                     TREATMENT DATE:  03/10/24 Supine UE ROM: OT providing demonstration and tactile cues for improved positioning/alignment for LUE and cues to maintain stretch 15-20 seconds for improved ROM and stretch.  Pt initially with quick movements, however demonstrating good carryover of sustained stretch after initial cue. - Open Book Chest Stretch  - Supine Shoulder Overhead Flexion with BUE together - holding ball for improved symmetry - Supine Shoulder Horizontal Abduction to/from Chest LB dressing: utilizing theraband to simulate LB dressing.  Pt initially attempting to reach towards  feet and place band under each feet.  OT reiterated use of reacher to increase reach and allow increased hip ROM to lift foot from floor to advance band around feet.  Pt benefiting from increased time and cues for sequencing with movement and placement of reacher.  Pt still demonstrating significant difficulty with LB dressing even with use of reacher.  Discussed sitting on lower height seat or use of box to bring feet closer to body.     03/03/24 Large amplitude: engaged in reaching outside BOS to high and low targets while in sitting based on called colors.  Pt demonstrating most difficulty with high and low targets.  OT increasing challenge to reaching across midline for targets.  Pt still with most difficulty at high and low targets.   LB dressing: engaged in simulated LB dressing with scarf.  Pt with difficulty reaching towards foot, therefore modified task to having pt lift thigh and advance scarf under thigh and back to focus on large amplitude when lifting legs to aid in LB dressing.  Attempted simulated LB dressing with gait belt and reacher, pt demonstrating decreased amplitude of lifting feet from floor therefore terminated task.  Pt dropping tissue during simulated LB dressing but able to pick up with use of reacher.       02/27/24 Pt reports some difficulty with donning UB clothing with compensatory technique. Completed stand>sit CGA to mat table, EOB>supine Min A, with pt able to bridge hips and scoot towards middle of mat table. Completed chest openers with mod cueing for proper form, 2x10 reps of B shoulder flexion. Supine>seated edge of mat table Mod A. Pt educated in donning/doffing of jacket, donning sleeves first then bringing overhead much like shirt donning technique. Pt was able to doff and don with increased time and Min A for minor adjustments. Pt educated in where to obtain angled spoon with built up handle via [place] (pt reports son has account and can purchase items as needed). Pt asked if [employer] had reachers of varying lengths, confirmed this and also educated pt in [employer] store usually having reachers in stock.    PATIENT EDUCATION: Education details: large amplitude and simulated LB dressing Person educated: Patient Education method: Explanation, Verbal cues, and Handouts Education comprehension: verbalized understanding and needs further education  HOME EXERCISE PROGRAM: TBD  GOALS: Goals reviewed with patient? Yes  SHORT TERM GOALS: Target date: 02/20/24  Pt will be independent in PD specific HEP. Baseline: 02/21/24: ongoing Goal status: in progress  2.  Pt and spouse will report understanding of adaptive equipment and/or adaptive strategies to increase ease with ADLs/IADLs. Baseline:  02/21/24: ongoing - educated on arm, arm, head technique with donning shirt Goal status: in progress  LONG TERM GOALS: Target date: 03/19/24  Pt will demonstrate improved ease with feeding as evidenced by decreasing PPT#2 (self feeding) by 5 secs with use of AE PRN. Baseline: 37.82 Goal status: in progress  2.  Pt will demonstrate increased B shoulder ROM to 120 or greater to increase ease with UB dressing. Baseline:  Goal status: in progress  3.  Pt with  demonstrate improved ease with bed mobility with use of AE and/or adaptive strategies PRN. Baseline:  Goal status: in progress  4.  Patient will report at least two-point increase in average PSFS score or at least three-point increase in a single activity score indicating functionally significant improvement given minimum detectable change. Baseline: 2.7 Goal status: in progress  ASSESSMENT:  CLINICAL IMPRESSION: Patient  is a 73 y.o. male who was seen today for occupational therapy treatment for progression of PD, with pt requiring increased physical assistance and increased time with ADLs.  Pt requiring mod assist sit <> supine this session but with improved sequencing.  Pt demonstrating improvements in shoulder ROM in supine exercises, especially with repetition.  Pt demonstrating mild improvements with ability to advance band over feet with use of reacher to simulate LB dressing, however still requiring increased time and may benefit from alternative sequencing and/or strategies.    PERFORMANCE DEFICITS: in functional skills including ADLs, coordination, ROM, strength, flexibility, Fine motor control, Gross motor control, balance, body mechanics, endurance, decreased knowledge of precautions, decreased knowledge of use of DME, and UE functional use and psychosocial skills including environmental adaptation and routines and behaviors.   IMPAIRMENTS: are limiting patient from ADLs, leisure, and social participation.    PLAN:  OT FREQUENCY: 1-2x/week  OT DURATION: 8 weeks  PLANNED INTERVENTIONS: 97168 OT Re-evaluation, 97535 self care/ADL training, 02889 therapeutic exercise, 97530 therapeutic activity, 97112 neuromuscular re-education, 97035 ultrasound, functional mobility training, psychosocial skills training, energy conservation, coping strategies training, patient/family education, and DME and/or AE instructions  RECOMMENDED OTHER SERVICES: NA  CONSULTED AND AGREED WITH PLAN OF CARE:  Patient and family member/caregiver  PLAN FOR NEXT SESSION: UB/ LB dressing with adaptive techniques and/or equipment, bed mobility, self-feeding with angled spoon, UB ROM/large amplitude  F/u on obtaining reacher and/or angled spoon    Nylen Creque, OTR/L 03/10/2024, 1:44 PM   [facility] [address] [place], [place] [address] Phone # [phone] Fax # [phone]

## 2024-03-10 NOTE — Therapy (Signed)
 OUTPATIENT SPEECH LANGUAGE PATHOLOGY [facility] TREATMENT   Patient Name: Matthew [name] MRN: [id number] DOB:05-Oct-1950, 73 y.o., male Today's Date: 03/10/2024  PCP: Okey Dunnings, MD REFERRING PROVIDER: Evonnie Stabs, DO  END OF SESSION:  End of Session - 03/10/24 1800     Visit Number 4    Number of Visits 13    Date for Recertification  04/04/24    SLP Start Time 1448    SLP Stop Time  1530    SLP Time Calculation (min) 42 min    Activity Tolerance Patient tolerated treatment well             Past Medical History:  Diagnosis Date   Anxiety    Arthritis    GERD (gastroesophageal reflux disease)    occ remote history   Hypertension    Parkinson disease (HCC)    Prostate cancer (HCC)    Right knee pain    questionable meniscal tear   Seasonal allergies    Past Surgical History:  Procedure Laterality Date   COLONOSCOPY     Leg Laceration Repair  Left    Age 61   PELVIC LYMPH NODE DISSECTION Bilateral 03/22/2018   Procedure: PELVIC LYMPH NODE DISSECTION;  Surgeon: Devere Lonni Righter, MD;  Location: [facility];  Service: Urology;  Laterality: Bilateral;   PROSTATE BIOPSY     ROBOT ASSISTED LAPAROSCOPIC RADICAL PROSTATECTOMY N/A 03/22/2018   Procedure: XI ROBOTIC ASSISTED LAPAROSCOPIC PROSTATECTOMY;  Surgeon: Devere Lonni Righter, MD;  Location: [facility];  Service: Urology;  Laterality: N/A;   TOTAL KNEE ARTHROPLASTY Right 05/05/2019   Procedure: RIGHT TOTAL KNEE ARTHROPLASTY;  Surgeon: Liam Lerner, MD;  Location: [facility];  Service: Orthopedics;  Laterality: Right;   Patient Active Problem List   Diagnosis Date Noted   S/P TKR (total knee replacement), right 05/05/2019   Osteoarthritis of right knee 05/02/2019   Prostate cancer (HCC) 03/22/2018   Malignant neoplasm of prostate (HCC) 02/27/2018    ONSET DATE: script dated 11/19/23  REFERRING DIAG: Parkinson's Disease  THERAPY DIAG:  Dysarthria and anarthria  Dysphagia, unspecified type  Cognitive  communication deficit  Aphasia  Rationale for Evaluation and Treatment: Rehabilitation  SUBJECTIVE:   SUBJECTIVE STATEMENT: When I do my loud /a/ Lupita it gets Advertising copywriter.  Pt accompanied by: significant other [name]  PERTINENT HISTORY: Pt well-known to this SLP from previous courses of ST. MBS in August 2023- results below. Pt's last ST course resulted in pt partially meeting goal for producing speech in upper 60s dB in short conversational segments.  PAIN:  Are you having pain? No  FALLS: Has patient fallen in last 6 months?  See PT evaluation for details  LIVING ENVIRONMENT: Lives with: lives with their family Lives in: House/apartment  PLOF:  Level of assistance: Needed assistance with ADLs, Needed assistance with IADLS  Employment: Retired  PATIENT GOALS: Improve loudness and swallowing  OBJECTIVE:   DIAGNOSTIC FINDINGS:  MBS 12-22-21 Patient presents with functional oropharyngeal swallowing without aspiration of any consistency tested. He does demonstrate minimal asymptomatic laryngeal penetration of thin liquid due to decreased timing of laryngeal closure. Chin tuck posture with use of straw effective to prevent penetration. Pharyngeal swallow is strong without retention. Oral transiting discoordination noted with pt swallowing barium tablet as he required 2nd bolus of thin to transit through oropharynx. Upon esophageal sweep, pt appeared with barium tablet retained in esophagus with pt awareness. Tablet appeared to transit distally after several boluses of liquids. After tablet had transited, pt continued with "  phantom" sensation to esophagus. Recommend pt continue diet as tolerated, using chin tuck with liquids if improves comfort, decreases cough. Also encouraged pt to assure maintains strength of voice, cough and expectoration for airway protection.      PATIENT REPORTED OUTCOME MEASURES (PROM): Communication Effectiveness Survey: to be completed in first 2  sessions  TODAY'S TREATMENT:                                                                                                                                         DATE:   03/10/24: Pt appears more tired today than he has the previous 2-3 sessions. Loud /a/ was average 84 dB, with sentences averaged 83dB. SLP then worked with pt with 30-45 second conversational segments with occasional mod-max cues for loudness; Significant loudness fade noted today especially in last 15 minutes of session. SLP cued pt x4 to swallow saliva during session as anterior labial leakage occurred x2 with pt unaware.   03/05/24: PT NEEDS PROM NEXT SESSION. Today SLP used 15-30 second conversational segments to help pt make louder speech more of a habit. Pt req'd usual mod A for louder speech after 8 seconds. This decr'd to occasional min-mod A after 7 trials.  With pt's loud /a/ he produced average 88dB with occasional mod A for loudness and inhibiting loudness fade. Everyday sentences were produced with average 82dB. Pt was provided homework for loudness.  03/03/24: Pt needs PROM next session. SLP assisted pt in generating new set of everyday sentences. Today pt req'd mod A usually to improve loudness decay in conversation over 8 seconds. SLP had pt third/last today so this may have played a role in this. Upon getting into lobby, pt's loudness decr'd immediately.   01/23/24: After evaluation, pt, SLP, and wife discussed pt's options for therapy at this time, with SLP collaborating with pt and wife what SLP thinks would be most profitable for pt given his input, wife's input, and the deficits highlighted with the evaluation completed today. A MBS is recommended.   PATIENT EDUCATION: Education details: see today's treatment Person educated: Patient and Spouse Education method: Explanation and Demonstration Education comprehension: verbalized understanding  HOME EXERCISE PROGRAM: TBD    GOALS: Goals reviewed with  patient? No  SHORT TERM GOALS: Target date: 03/21/24-given date of first ST  Produce /a/ with average upper 80s dB over three sessions Baseline: Goal status: INITIAL  2.  pt will generate 3 minutes simple conversation with upper 60s dB average given occasional min A in 3 sessions  Baseline:  Goal status: INITIAL  3.  pt will improve verbal expression to a functional volume when cued by [name] in 3 sessions  Baseline:  Goal status: INITIAL  4.  If necessary, pt will use strategies provided during MBS with written cues in 3 sessions Baseline:  Goal status: INITIAL   LONG TERM GOALS: Target date: 04/04/24  pt will  produce average upper 60s dB in 5 minutes simple conversation in 3 sessions  Baseline:  Goal status: INITIAL  2.   pt will improve verbal expression to a functional volume when cued by [name] in 3 sessions, after 03/21/24 Baseline:  Goal status: INITIAL  3.  pt will report fewer requests to repeat himself than prior to ST in 3 sessions Baseline:  Goal status: INITIAL  4.   Pt will use strategies for safer swallowing in 3 sessions with modified independence  Baseline:  Goal status: INITIAL  5.  Pt will score higher/better on PROM in the last 2 weeks of therapy Baseline:  Goal status: INITIAL   ASSESSMENT:  CLINICAL IMPRESSION: Patient is a 73 y.o. male who was seen today for treatment of dysarthria and in light of stage V Parkinson's Disease. See treatment date above for today's date for further details on today's session.  During eval, pt was able to improve loudness in simple conversation for 90 seconds with cues from SLP and thus is a good candidate for ST.  Pt would benefit from MBS, one will be suggested to referring MD.  OBJECTIVE IMPAIRMENTS: Objective impairments include executive functioning, aphasia, dysarthria, and dysphagia. These impairments are limiting patient from household responsibilities, ADLs/IADLs, effectively communicating at home and in  community, and safety when swallowing.Factors affecting potential to achieve goals and functional outcome are none.. Patient will benefit from skilled SLP services to address above impairments and improve overall function.  REHAB POTENTIAL: Good  PLAN:  SLP FREQUENCY: 1-2x/week  SLP DURATION: 6 weeks  PLANNED INTERVENTIONS: Aspiration precaution training, Diet toleration management , Environmental controls, Cueing hierachy, Cognitive reorganization, Internal/external aids, Oral motor exercises, Functional tasks, Multimodal communication approach, SLP instruction and feedback, Compensatory strategies, and Patient/family education    [facility], CCC-SLP 03/10/2024, 6:00 PM

## 2024-03-12 ENCOUNTER — Ambulatory Visit

## 2024-03-14 ENCOUNTER — Other Ambulatory Visit: Payer: Self-pay | Admitting: Neurology

## 2024-03-14 ENCOUNTER — Other Ambulatory Visit (INDEPENDENT_AMBULATORY_CARE_PROVIDER_SITE_OTHER)

## 2024-03-14 ENCOUNTER — Ambulatory Visit (INDEPENDENT_AMBULATORY_CARE_PROVIDER_SITE_OTHER): Admitting: Neurology

## 2024-03-14 DIAGNOSIS — K117 Disturbances of salivary secretion: Secondary | ICD-10-CM

## 2024-03-14 DIAGNOSIS — G20A2 Parkinson's disease without dyskinesia, with fluctuations: Secondary | ICD-10-CM

## 2024-03-14 MED ORDER — INCOBOTULINUMTOXINA 100 UNITS IM SOLR
100.0000 [IU] | INTRAMUSCULAR | Status: AC
  Administered 2024-03-14: 100 [IU] via INTRAMUSCULAR

## 2024-03-14 NOTE — Procedures (Signed)
 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  The patient understands that this warning states that there have been reported cases of the Botox extending beyond the injection site and creating adverse effects, similar to those of botulism. This included loss of strength, trouble walking, hoarseness, trouble saying words clearly, loss of bladder control, trouble breathing, trouble swallowing, diplopia, blurry vision and ptosis. Most of the distant spread of Botox was happening in patients, primarily children, who received medication for spasticity or for cervical dystonia. The patient expressed understanding and desire to proceed.   Injections  Location Left  Right Units Number of sites  Parotid (point 1 on picture) 30 30 60 1 per side  Submandibular (point 2) 20 20 40 1 per side  TOTAL UNITS:   100    Type of Toxin: Xeomin  Discarded Units: 0  Needle drawback with each injection was free of blood. Pt tolerated procedure well without complications.   Reinjection is anticipated in 4 months.

## 2024-03-17 ENCOUNTER — Ambulatory Visit: Admitting: Occupational Therapy

## 2024-03-17 ENCOUNTER — Ambulatory Visit

## 2024-03-17 DIAGNOSIS — M6281 Muscle weakness (generalized): Secondary | ICD-10-CM

## 2024-03-17 DIAGNOSIS — R29818 Other symptoms and signs involving the nervous system: Secondary | ICD-10-CM

## 2024-03-17 DIAGNOSIS — R293 Abnormal posture: Secondary | ICD-10-CM

## 2024-03-17 DIAGNOSIS — R4701 Aphasia: Secondary | ICD-10-CM

## 2024-03-17 DIAGNOSIS — R2689 Other abnormalities of gait and mobility: Secondary | ICD-10-CM

## 2024-03-17 DIAGNOSIS — R278 Other lack of coordination: Secondary | ICD-10-CM

## 2024-03-17 DIAGNOSIS — G20A2 Parkinson's disease without dyskinesia, with fluctuations: Secondary | ICD-10-CM

## 2024-03-17 DIAGNOSIS — R41841 Cognitive communication deficit: Secondary | ICD-10-CM

## 2024-03-17 DIAGNOSIS — R471 Dysarthria and anarthria: Secondary | ICD-10-CM

## 2024-03-17 DIAGNOSIS — R2681 Unsteadiness on feet: Secondary | ICD-10-CM

## 2024-03-17 NOTE — Therapy (Signed)
 OUTPATIENT SPEECH LANGUAGE PATHOLOGY [facility] TREATMENT   Patient Name: Matthew [name] MRN: [id number] DOB:08/24/1950, 73 y.o., male Today's Date: 03/17/2024  PCP: Okey Dunnings, MD REFERRING PROVIDER: Evonnie Stabs, DO  END OF SESSION:  End of Session - 03/17/24 1440     Visit Number 5    Number of Visits 13    Date for Recertification  04/04/24    SLP Start Time 1321    SLP Stop Time  1401    SLP Time Calculation (min) 40 min    Activity Tolerance Patient tolerated treatment well             Past Medical History:  Diagnosis Date   Anxiety    Arthritis    GERD (gastroesophageal reflux disease)    occ remote history   Hypertension    Parkinson disease (HCC)    Prostate cancer (HCC)    Right knee pain    questionable meniscal tear   Seasonal allergies    Past Surgical History:  Procedure Laterality Date   COLONOSCOPY     Leg Laceration Repair  Left    Age 60   PELVIC LYMPH NODE DISSECTION Bilateral 03/22/2018   Procedure: PELVIC LYMPH NODE DISSECTION;  Surgeon: Devere Lonni Righter, MD;  Location: [facility];  Service: Urology;  Laterality: Bilateral;   PROSTATE BIOPSY     ROBOT ASSISTED LAPAROSCOPIC RADICAL PROSTATECTOMY N/A 03/22/2018   Procedure: XI ROBOTIC ASSISTED LAPAROSCOPIC PROSTATECTOMY;  Surgeon: Devere Lonni Righter, MD;  Location: [facility];  Service: Urology;  Laterality: N/A;   TOTAL KNEE ARTHROPLASTY Right 05/05/2019   Procedure: RIGHT TOTAL KNEE ARTHROPLASTY;  Surgeon: Liam Lerner, MD;  Location: [facility];  Service: Orthopedics;  Laterality: Right;   Patient Active Problem List   Diagnosis Date Noted   S/P TKR (total knee replacement), right 05/05/2019   Osteoarthritis of right knee 05/02/2019   Prostate cancer (HCC) 03/22/2018   Malignant neoplasm of prostate (HCC) 02/27/2018    ONSET DATE: script dated 11/19/23  REFERRING DIAG: Parkinson's Disease  THERAPY DIAG:  No diagnosis found.  Rationale for Evaluation and Treatment:  Rehabilitation  SUBJECTIVE:   SUBJECTIVE STATEMENT: When I do my loud /a/ Lupita it gets advertising copywriter.  Pt accompanied by: significant other [name]  PERTINENT HISTORY: Pt well-known to this SLP from previous courses of ST. MBS in August 2023- results below. Pt's last ST course resulted in pt partially meeting goal for producing speech in upper 60s dB in short conversational segments.  PAIN:  Are you having pain? No  FALLS: Has patient fallen in last 6 months?  See PT evaluation for details  LIVING ENVIRONMENT: Lives with: lives with their family Lives in: House/apartment  PLOF:  Level of assistance: Needed assistance with ADLs, Needed assistance with IADLS  Employment: Retired  PATIENT GOALS: Improve loudness and swallowing  OBJECTIVE:   DIAGNOSTIC FINDINGS:  MBS 12-22-21 Patient presents with functional oropharyngeal swallowing without aspiration of any consistency tested. He does demonstrate minimal asymptomatic laryngeal penetration of thin liquid due to decreased timing of laryngeal closure. Chin tuck posture with use of straw effective to prevent penetration. Pharyngeal swallow is strong without retention. Oral transiting discoordination noted with pt swallowing barium tablet as he required 2nd bolus of thin to transit through oropharynx. Upon esophageal sweep, pt appeared with barium tablet retained in esophagus with pt awareness. Tablet appeared to transit distally after several boluses of liquids. After tablet had transited, pt continued with "phantom" sensation to esophagus. Recommend pt continue diet as tolerated,  using chin tuck with liquids if improves comfort, decreases cough. Also encouraged pt to assure maintains strength of voice, cough and expectoration for airway protection.      PATIENT REPORTED OUTCOME MEASURES (PROM): Communication Effectiveness Survey: to be completed in first 2 sessions  TODAY'S TREATMENT:                                                                                                                                          DATE:   03/17/24: Pt cont looking more fatigued today - comparable to previous session. Loud /a/ average was 85dB with usual cues for maintaining loudness. Sentences averaged 83dB. SLP guided pt in short conversational segments of 30 seconds-45 seconds, with usual mod A for loudness faded to usual min A for loudness. Loudness fade was prevalent.   03/10/24: Pt appears more tired today than he has the previous 2-3 sessions. Loud /a/ was average 84 dB, with sentences averaged 83dB. SLP then worked with pt with 30-45 second conversational segments with occasional mod-max cues for loudness; Significant loudness fade noted today especially in last 15 minutes of session. SLP cued pt x4 to swallow saliva during session as anterior labial leakage occurred x2 with pt unaware.   03/05/24: PT NEEDS PROM NEXT SESSION. Today SLP used 15-30 second conversational segments to help pt make louder speech more of a habit. Pt req'd usual mod A for louder speech after 8 seconds. This decr'd to occasional min-mod A after 7 trials.  With pt's loud /a/ he produced average 88dB with occasional mod A for loudness and inhibiting loudness fade. Everyday sentences were produced with average 82dB. Pt was provided homework for loudness.  03/03/24: Pt needs PROM next session. SLP assisted pt in generating new set of everyday sentences. Today pt req'd mod A usually to improve loudness decay in conversation over 8 seconds. SLP had pt third/last today so this may have played a role in this. Upon getting into lobby, pt's loudness decr'd immediately.   01/23/24: After evaluation, pt, SLP, and wife discussed pt's options for therapy at this time, with SLP collaborating with pt and wife what SLP thinks would be most profitable for pt given his input, wife's input, and the deficits highlighted with the evaluation completed today. A MBS is recommended.   PATIENT  EDUCATION: Education details: see today's treatment Person educated: Patient and Spouse Education method: Explanation and Demonstration Education comprehension: verbalized understanding  HOME EXERCISE PROGRAM: TBD    GOALS: Goals reviewed with patient? No  SHORT TERM GOALS: Target date: 03/21/24-given date of first ST  Produce /a/ with average upper 80s dB over three sessions Baseline: Goal status: INITIAL  2.  pt will generate 3 minutes simple conversation with upper 60s dB average given occasional min A in 3 sessions  Baseline:  Goal status: INITIAL  3.  pt will improve verbal expression to a functional volume when  cued by [name] in 3 sessions  Baseline:  Goal status: INITIAL  4.  If necessary, pt will use strategies provided during MBS with written cues in 3 sessions Baseline:  Goal status: INITIAL   LONG TERM GOALS: Target date: 04/04/24  pt will produce average upper 60s dB in 5 minutes simple conversation in 3 sessions  Baseline:  Goal status: INITIAL  2.   pt will improve verbal expression to a functional volume when cued by [name] in 3 sessions, after 03/21/24 Baseline:  Goal status: INITIAL  3.  pt will report fewer requests to repeat himself than prior to ST in 3 sessions Baseline:  Goal status: INITIAL  4.   Pt will use strategies for safer swallowing in 3 sessions with modified independence  Baseline:  Goal status: INITIAL  5.  Pt will score higher/better on PROM in the last 2 weeks of therapy Baseline:  Goal status: INITIAL   ASSESSMENT:  CLINICAL IMPRESSION: Patient is a 73 y.o. male who was seen today for treatment of dysarthria and in light of stage V Parkinson's Disease. See treatment date above for today's date for further details on today's session.  During eval, pt was able to improve loudness in simple conversation for 90 seconds with cues from SLP and thus is a good candidate for ST.  Pt would benefit from MBS, one will be suggested to  referring MD.  OBJECTIVE IMPAIRMENTS: Objective impairments include executive functioning, aphasia, dysarthria, and dysphagia. These impairments are limiting patient from household responsibilities, ADLs/IADLs, effectively communicating at home and in community, and safety when swallowing.Factors affecting potential to achieve goals and functional outcome are none.. Patient will benefit from skilled SLP services to address above impairments and improve overall function.  REHAB POTENTIAL: Good  PLAN:  SLP FREQUENCY: 1-2x/week  SLP DURATION: 6 weeks  PLANNED INTERVENTIONS: Aspiration precaution training, Diet toleration management , Environmental controls, Cueing hierachy, Cognitive reorganization, Internal/external aids, Oral motor exercises, Functional tasks, Multimodal communication approach, SLP instruction and feedback, Compensatory strategies, and Patient/family education    [facility], CCC-SLP 03/17/2024, 2:40 PM

## 2024-03-17 NOTE — Therapy (Signed)
 OUTPATIENT OCCUPATIONAL THERAPY [facility]  Treatment Note  Patient Name: Matthew [name] MRN: [id number] DOB:Dec 30, 1950, 73 y.o., male Today's Date: 03/17/2024  PCP: Okey Carlin Redbird, MD REFERRING PROVIDER: Evonnie Asberry RAMAN, DO  Occupational Therapy Progress Note  Dates of Reporting Period: 01/23/24 to 03/17/24  Goal Update: Pt demonstrating increased flexed posture in sitting and with mobility this date as well as decreased shoulder ROM.  Pt requiring increased time during simulated self-feeding and LB dressing despite attempts at alternative strategies and with use of AE.  Plan: Pt and spouse to discuss whether to take a break from therapy or to continue.    END OF SESSION:  OT End of Session - 03/17/24 1406     Visit Number 10    Number of Visits 14    Date for Recertification  04/16/24    Authorization Type [employer]    OT Start Time 1404    OT Stop Time 1450    OT Time Calculation (min) 46 min    Activity Tolerance Patient tolerated treatment well                  Past Medical History:  Diagnosis Date   Anxiety    Arthritis    GERD (gastroesophageal reflux disease)    occ remote history   Hypertension    Parkinson disease (HCC)    Prostate cancer (HCC)    Right knee pain    questionable meniscal tear   Seasonal allergies    Past Surgical History:  Procedure Laterality Date   COLONOSCOPY     Leg Laceration Repair  Left    Age 73   PELVIC LYMPH NODE DISSECTION Bilateral 03/22/2018   Procedure: PELVIC LYMPH NODE DISSECTION;  Surgeon: Devere Lonni Righter, MD;  Location: [facility];  Service: Urology;  Laterality: Bilateral;   PROSTATE BIOPSY     ROBOT ASSISTED LAPAROSCOPIC RADICAL PROSTATECTOMY N/A 03/22/2018   Procedure: XI ROBOTIC ASSISTED LAPAROSCOPIC PROSTATECTOMY;  Surgeon: Devere Lonni Righter, MD;  Location: [facility];  Service: Urology;  Laterality: N/A;   TOTAL KNEE ARTHROPLASTY Right 05/05/2019   Procedure: RIGHT TOTAL KNEE  ARTHROPLASTY;  Surgeon: Liam Lerner, MD;  Location: [facility];  Service: Orthopedics;  Laterality: Right;   Patient Active Problem List   Diagnosis Date Noted   S/P TKR (total knee replacement), right 05/05/2019   Osteoarthritis of right knee 05/02/2019   Prostate cancer (HCC) 03/22/2018   Malignant neoplasm of prostate (HCC) 02/27/2018    ONSET DATE: referral date 11/19/23  REFERRING DIAG: G20.A2 (ICD-10-CM) - Parkinson's disease without dyskinesia, with fluctuating manifestations (HCC) R49.8 (ICD-10-CM) - Hypophonia  THERAPY DIAG:  Other symptoms and signs involving the nervous system - Plan: Ot plan of care cert/re-cert  Muscle weakness (generalized) - Plan: Ot plan of care cert/re-cert  Other lack of coordination - Plan: Ot plan of care cert/re-cert  Rationale for Evaluation and Treatment: Rehabilitation  SUBJECTIVE:   SUBJECTIVE STATEMENT: Pt reporting that he has been shaky all morning.  Pt accompanied by: self and significant other (spouse)  PERTINENT HISTORY: PD, anxiety, arthritis, GERD, HTN, R TKR   PRECAUTIONS: Fall  WEIGHT BEARING RESTRICTIONS: No  PAIN:  Are you having pain? No  FALLS: Has patient fallen in last 6 months? Yes. Number of falls when he falls I can't get him up. A few falls; Has not fallen in the last 2 months  LIVING ENVIRONMENT: Lives with: lives with their spouse Lives in: House/apartment Stairs: 1 step down to screened porch with  small portable ramp, 3 steps at the garage with 1 rail and wall Has following equipment at home: hospital bed, 4 wheeled walker, wheelchair, grab bars in the shower, BSC next to the shower for ease of transfers and dressing, arms on toilet, trapeze   PLOF: Requires assistive device for independence and Needs assistance with ADLs  PATIENT GOALS: strategies to increase ease with bed mobility and eating  OBJECTIVE:  Note: Objective measures were completed at Evaluation unless otherwise noted.  HAND DOMINANCE:  Right  ADLs: Overall ADLs: Spouse is assisting with all aspects of bathing and dressing.  Reports that it will take upwards of an hour for bathing and dressing.   Transfers/ambulation related to ADLs: Increased use of wheelchair in the home. Still with use of 4 wheeled walker intermittently.  Pt with tendency to veer to the left with ambulation Eating: wife cuts up food, gives a lot of finger foods.  Pt has tried a variety of adaptive utensils with minimal impact Grooming: utilizing electric toothbrush and water  pick for oral care, difficulty with brushing hair  UB Dressing: wife assisting for time LB Dressing: wife assisting due to decreased reach towards floor and for time Toileting: requires momentum to stand up from toilet or will reach towards door for leverage/momentum, reports struggle with hygiene.  Increased time spent on the toilet due to constipation Bathing: wife assisting for time Tub Shower transfers: wife provides supervision, handle is just inside door and pt able to hold on to it while stepping over small ledge  Equipment: Grab bars, Walk in shower, and Long handled sponge  IADLs: Spouse completes all shopping, meal prep, and housekeeping tasks. Community mobility: Relies on family or friends for transportation Medication management: pt fills pill box and is able to take correct dosages at correct time Financial management: wife completes and did prior  MOBILITY STATUS: increased use of wheelchair in the home and in the community, veers to Left with ambulation with 4 wheeled walker  POSTURE COMMENTS:  rounded shoulders, forward head, and weight shift left  ACTIVITY TOLERANCE: Activity tolerance: diminished  FUNCTIONAL OUTCOME MEASURES: Physical performance test: PPT#2 (simulated eating) 37.82 & PPT#4 (donning/doffing jacket): TBD     03/17/24   COORDINATION: Not assessed  UE ROM:   L shoulder flexion 105, abduction 60, pronation 70        R shoulder flexion  100, abduction 90, pronation 80  03/17/24:  R shoulder flexion 90 L shoulder flexion 95   UE MMT:   grossly 3+/5   MUSCLE TONE: RUE: Mild and LUE: Mild  COGNITION: Overall cognitive status: Within functional limits for tasks assessed and slower processing and speech  OBSERVATIONS: Bradykinesia; Pt requiring min assist for anterior weight shift for sit > stand.  Pt veering to L while he was walking back to treatment room.                                                                                                                    TREATMENT  DATE:  03/17/24 Self-care: pt taking PD medications upon arrival to OT session.  Pt utilizing damp finger to retreive pills from pill box one at a time and place into mouth.  Pt requiring increased time to remove cup from cup holder on RW.   Self-feeding: PPT #2 (self-feeding) Completed scooping 5 beans with built up spoon in 2:03.  Attempted with angled spoon, however only able to scoop one bean in 54.92 sec before terminating task.  OT educating on rationale for angled spoon, however pt with increased difficulty with scooping from bowl with angled spoon.  Completed with standard spoon and pt able to complete in 36.47 sec with standard spoon.   LB dressing: pt utilizing reacher to aid in advancing gait belt over feet and then pulling up to knees as using gait belt to simulate donning pants.  Pt able to complete task, however still requiring increased time.  OT educating on strategy with rolling waist band and using reacher when donning Depends.   Goals assessed see objective measures above for PSFS and goal section below for more details.   03/10/24 Supine UE ROM: OT providing demonstration and tactile cues for improved positioning/alignment for LUE and cues to maintain stretch 15-20 seconds for improved ROM and stretch.  Pt initially with quick movements, however demonstrating good carryover of sustained stretch after initial cue. - Open Book  Chest Stretch  - Supine Shoulder Overhead Flexion with BUE together - holding ball for improved symmetry - Supine Shoulder Horizontal Abduction to/from Chest LB dressing: utilizing theraband to simulate LB dressing.  Pt initially attempting to reach towards feet and place band under each feet.  OT reiterated use of reacher to increase reach and allow increased hip ROM to lift foot from floor to advance band around feet.  Pt benefiting from increased time and cues for sequencing with movement and placement of reacher.  Pt still demonstrating significant difficulty with LB dressing even with use of reacher.  Discussed sitting on lower height seat or use of box to bring feet closer to body.     03/03/24 Large amplitude: engaged in reaching outside BOS to high and low targets while in sitting based on called colors.  Pt demonstrating most difficulty with high and low targets.  OT increasing challenge to reaching across midline for targets.  Pt still with most difficulty at high and low targets.   LB dressing: engaged in simulated LB dressing with scarf.  Pt with difficulty reaching towards foot, therefore modified task to having pt lift thigh and advance scarf under thigh and back to focus on large amplitude when lifting legs to aid in LB dressing.  Attempted simulated LB dressing with gait belt and reacher, pt demonstrating decreased amplitude of lifting feet from floor therefore terminated task.  Pt dropping tissue during simulated LB dressing but able to pick up with use of reacher.      PATIENT EDUCATION: Education details: large amplitude; simulated self-feeding and LB dressing Person educated: Patient Education method: Explanation, Verbal cues, and Handouts Education comprehension: verbalized understanding and needs further education  HOME EXERCISE PROGRAM: TBD  GOALS: Goals reviewed with patient? Yes  SHORT TERM GOALS: Target date: 02/20/24  1. Pt will be independent in PD specific  HEP. Baseline: 02/21/24: ongoing Goal status: in progress  2.  Pt and spouse will report understanding of adaptive equipment and/or adaptive strategies to increase ease with ADLs/IADLs. Baseline:  02/21/24: ongoing - educated on arm, arm, head technique with donning shirt 03/17/24: ongoing -  reiterated above education and use of reacher for LB dressing Goal status: in progress  LONG TERM GOALS: Target date: 03/19/24  Pt will demonstrate improved ease with feeding as evidenced by decreasing PPT#2 (self feeding) by 5 secs with use of AE PRN. Baseline: 37.82 03/17/24: 36.47 sec Goal status: in progress  2.  Pt will demonstrate increased B shoulder ROM to 120 or greater to increase ease with UB dressing. Baseline:  03/17/24: R shoulder flexion 90, L shoulder flexion 95 Goal status: in progress  3.  Pt with demonstrate improved ease with bed mobility with use of AE and/or adaptive strategies PRN. Baseline:  03/17/24: continues to require assist from spouse Goal status: in progress  4.  Patient will report at least two-point increase in average PSFS score or at least three-point increase in a single activity score indicating functionally significant improvement given minimum detectable change. Baseline: 2.7 03/17/24: 2.3 Goal status: in progress  SHORT TERM GOALS: Target date: 04/02/24  1. Pt will be independent in PD specific HEP. Baseline: 02/21/24: ongoing Goal status: in progress  2.  Pt and spouse will report understanding of adaptive equipment and/or adaptive strategies to increase ease with ADLs/IADLs. Baseline:  02/21/24: ongoing - educated on arm, arm, head technique with donning shirt 03/17/24: ongoing - reiterated above education and use of reacher for LB dressing Goal status: in progress  LONG TERM GOALS: Target date: 04/16/24  Pt will demonstrate improved ease with feeding as evidenced by decreasing PPT#2 (self feeding) by 5 secs with use of AE PRN. Baseline:  37.82 03/17/24: 36.47 sec Goal status: in progress  2.  Pt will demonstrate increased B shoulder ROM to 120 or greater to increase ease with UB dressing. Baseline:  03/17/24: R shoulder flexion 90, L shoulder flexion 95 Goal status: in progress  3.  Pt with demonstrate improved ease with bed mobility with use of AE and/or adaptive strategies PRN. Baseline:  03/17/24: continues to require assist from spouse Goal status: in progress  4.  Patient will report at least two-point increase in average PSFS score or at least three-point increase in a single activity score indicating functionally significant improvement given minimum detectable change. Baseline: 2.7 03/17/24: 2.3 Goal status: in progress  ASSESSMENT:  CLINICAL IMPRESSION: Patient is a 73 y.o. male who was seen today for occupational therapy treatment for progression of PD, with pt requiring increased physical assistance and increased time with ADLs.  Pt demonstrating more flexed posture during ambulation and in sitting this session.  Pt demonstrating decreased shoulder ROM impacting ease with aspects of ADLs.  Pt demonstrating mild improvements with ability to advance gait belt over feet with use of reacher to simulate LB dressing, however still requiring increased time and may benefit from step stool to increase ease to reach towards feet.  Pt expressing conflicting responses in regards to continuing on with therapy visits.  Pt stating that he and his wife had discussed potential benefit from taking a break from therapy but also he expressed the benefits from getting out of the house and participating in exercises during therapy session.  Pt to discuss with spouse about whether to continue on for additional few more visits or to take a break, therefore OT completing re-cert this session in preparation for continued services.  PERFORMANCE DEFICITS: in functional skills including ADLs, coordination, ROM, strength, flexibility, Fine  motor control, Gross motor control, balance, body mechanics, endurance, decreased knowledge of precautions, decreased knowledge of use of DME, and UE functional use and  psychosocial skills including environmental adaptation and routines and behaviors.   IMPAIRMENTS: are limiting patient from ADLs, leisure, and social participation.    PLAN:  OT FREQUENCY: 1-2x/week  OT DURATION: 8 weeks  PLANNED INTERVENTIONS: 97168 OT Re-evaluation, 97535 self care/ADL training, 02889 therapeutic exercise, 97530 therapeutic activity, 97112 neuromuscular re-education, 97035 ultrasound, functional mobility training, psychosocial skills training, energy conservation, coping strategies training, patient/family education, and DME and/or AE instructions  RECOMMENDED OTHER SERVICES: NA  CONSULTED AND AGREED WITH PLAN OF CARE: Patient and family member/caregiver  PLAN FOR NEXT SESSION: UB/ LB dressing with adaptive techniques and/or equipment, bed mobility, self-feeding with angled spoon, UB ROM/large amplitude  F/u on obtaining reacher   KAYLENE DOMINO, OTR/L 03/17/2024, 4:18 PM   [facility] [address] [place], [place] [address] Phone # [phone] Fax # [phone]

## 2024-03-17 NOTE — Therapy (Signed)
 OUTPATIENT PHYSICAL THERAPY NEURO TREATMENT NOTE   Patient Name: Matthew [name] MRN: [id number] DOB:May 18, 1951, 73 y.o., male Today's Date: 03/17/2024   PCP: Okey Carlin Redbird, MD REFERRING PROVIDER: Tat, Asberry RAMAN, DO    END OF SESSION:  PT End of Session - 03/17/24 1442     Visit Number 10    Number of Visits 12    Date for Recertification  04/04/24    Authorization Type HTA    Progress Note Due on Visit 18   PN completed visit 8   PT Start Time 1445    PT Stop Time 1530    PT Time Calculation (min) 45 min    Equipment Utilized During Treatment Gait belt    Activity Tolerance Patient tolerated treatment well    Behavior During Therapy WFL for tasks assessed/performed               Past Medical History:  Diagnosis Date   Anxiety    Arthritis    GERD (gastroesophageal reflux disease)    occ remote history   Hypertension    Parkinson disease (HCC)    Prostate cancer (HCC)    Right knee pain    questionable meniscal tear   Seasonal allergies    Past Surgical History:  Procedure Laterality Date   COLONOSCOPY     Leg Laceration Repair  Left    Age 47   PELVIC LYMPH NODE DISSECTION Bilateral 03/22/2018   Procedure: PELVIC LYMPH NODE DISSECTION;  Surgeon: Devere Lonni Righter, MD;  Location: [facility];  Service: Urology;  Laterality: Bilateral;   PROSTATE BIOPSY     ROBOT ASSISTED LAPAROSCOPIC RADICAL PROSTATECTOMY N/A 03/22/2018   Procedure: XI ROBOTIC ASSISTED LAPAROSCOPIC PROSTATECTOMY;  Surgeon: Devere Lonni Righter, MD;  Location: [facility];  Service: Urology;  Laterality: N/A;   TOTAL KNEE ARTHROPLASTY Right 05/05/2019   Procedure: RIGHT TOTAL KNEE ARTHROPLASTY;  Surgeon: Liam Lerner, MD;  Location: [facility];  Service: Orthopedics;  Laterality: Right;   Patient Active Problem List   Diagnosis Date Noted   S/P TKR (total knee replacement), right 05/05/2019   Osteoarthritis of right knee 05/02/2019   Prostate cancer (HCC) 03/22/2018   Malignant  neoplasm of prostate (HCC) 02/27/2018    ONSET DATE: MD referral 11/19/2023  REFERRING DIAG:  G20.A2 (ICD-10-CM) - Parkinson's disease without dyskinesia, with fluctuating manifestations (HCC)  R49.8 (ICD-10-CM) - Hypophonia    THERAPY DIAG:  Muscle weakness (generalized)  Unsteadiness on feet  Abnormal posture  Other abnormalities of gait and mobility  Parkinson's disease without dyskinesia, with fluctuating manifestations (HCC)  Rationale for Evaluation and Treatment: Rehabilitation  SUBJECTIVE:  SUBJECTIVE STATEMENT: Doing ok. I'd like to work on balance Pt accompanied by: significant other  PERTINENT HISTORY: Parkinson's, R TKR, prostate Ca, HTN  PAIN:  No PRECAUTIONS: Fall and Other: hx of neurogenic orthostatic hypotension  RED FLAGS: None   WEIGHT BEARING RESTRICTIONS: No  FALLS: Has patient fallen in last 6 months? Several falls; less falls since hospital bed; may 1 fall every 1-2 months Feels like he may lose balance backwards and wife provides very close supervision  LIVING ENVIRONMENT: Lives with: lives with their family Lives in: House/apartment Stairs: 2-3 steps to enter Has following equipment at home: Vannie - 2 wheeled, Environmental Consultant - 4 wheeled, Wheelchair (manual), and hospital bed and manual w/c and transport w/c; grab bars in shower. BSC, lift chair.  Has a lower extremity pedaler at home (since knee surgery)  PLOF: Needs assistance with gait, Needs assistance with transfers, and Leisure: does voice exercises; does PWR! Moves exercises (about 15-20 min/day), dance class online, chair ex on [employer] (Sit and Be Fit)  PATIENT GOALS: To get a better exercise routine compared to what I'm doing now.  To try to figure out what he can do for exercise and posture.  OBJECTIVE:    TODAY'S TREATMENT: 03/17/24 Activity Comments  Corner balance/coordination -physioball behind back: 10 reps PWR Up. Standing lumbar extension x 10 reps -standing on foam: EO/EC 3x30 sec, tactile cues for trunk extension. Alt toe taps 4 box 2x60 sec, CGA. Static stance foot elevated 2x30 sec  Gait training Trials w/ counted steps for stride length with good carryover. Attempts at increased speed with decreased stride. Trials w/ conversation going from 28 steps to cover 45 ft to 35 steps.                  TODAY'S TREATMENT: 03/10/24 Activity Comments  Upper body strength -seated rows: 1x10,12,14--15#,20,25# -- RPE 5/10 -lat row: 1x10 30# 7/10 RPE. 1x12 and 1x14 35#  STS 3x10 BHR. 2x10 with speed  Step-ups 2x10 BHR, 8, tactile cues 75% of time to lumbar for trunk extension  Coordination/power 1x10 chest pass red med ball 1x10 under hand toss           Begins session by taking his 2 pm medications  TODAY'S TREATMENT: 03/03/2024 Activity Comments  FTSTS:  22.44 sec Improved from 24 sec  Berg:  NT   10 M walk:  18.69 sec  (1.75 ft/se Gait with back step>forward step to initiate gait (to offset freezing episodes) Improved from 1.09 ft/sec  Standing exercises at counter: Side step and weightshift x 10 Back step and weightshift x 10   Sit to stand with short distance gait/turns Cues for wide BOS, lateral weightshifting, take as few steps as possilbe              Access Code: [email or web address] URL: [email or web address] Date: 02/11/2024; 03/03/2024 Prepared by: [facility] - Outpatient  Rehab - [facility] PWR! Moves modified quadruped x 10 Side step and backward step at weightshift at counter, 2 x 5 reps Exercises - Seated Flexion Stretch with Swiss Ball  - 1-2 x daily - 7 x weekly - 1 sets - 5 reps - Seated Thoracic Flexion and Rotation with Swiss Ball  - 1-2 x daily - 7 x weekly - 1 sets - 5 reps - Seated Hamstring Stretch  - 2 x daily - 7 x weekly - 1 sets  - 3 reps - Wall Quarter Squat with Swiss Ball  - 1 x daily - 7 x weekly -  3 sets - 10 reps - Serratus Activation at [employer] with [employer] and Resistance Band  - 1 x daily - 7 x weekly - 3 sets - 10 reps  PATIENT EDUCATION: Education details:  Updated/additions to HEP; tips to reduce freezing episodes with gait Person educated: Patient and Spouse Education method: Explanation, Demonstration, and Handouts Education comprehension: verbalized understanding and returned demonstration  ----------------------------------------------------- Note: Objective measures were completed at Evaluation unless otherwise noted.  DIAGNOSTIC FINDINGS: NA for this episode  COGNITION: Overall cognitive status: Within functional limits for tasks assessed and slowed response time   POSTURE: rounded shoulders, forward head, posterior pelvic tilt, flexed trunk , and weight shift left  LOWER EXTREMITY ROM:     Active  Right Eval Left Eval  Hip flexion    Hip extension    Hip abduction    Hip adduction    Hip internal rotation    Hip external rotation    Knee flexion    Knee extension -35 -40  Ankle dorsiflexion    Ankle plantarflexion    Ankle inversion    Ankle eversion     (Blank rows = not tested)  LOWER EXTREMITY MMT:    MMT Right Eval Left Eval  Hip flexion 4 4  Hip extension    Hip abduction 4 4  Hip adduction 4 4  Hip internal rotation    Hip external rotation    Knee flexion 4 4  Knee extension 4 4  Ankle dorsiflexion 4 4  Ankle plantarflexion    Ankle inversion    Ankle eversion    (Blank rows = not tested)  BED MOBILITY:  Not tested  TRANSFERS: Sit to stand: CGA  Assistive device utilized: None     Stand to sit: CGA  Assistive device utilized: hands to reach to chair      GAIT: Findings: Gait Characteristics: step to pattern, decreased step length- Right, decreased step length- Left, knee flexed in stance- Right, knee flexed in stance- Left, shuffling, festinating,  lateral lean- Left, poor foot clearance- Right, and poor foot clearance- Left, Distance walked: 40 ft, Assistive device utilized:Walker - 4 wheeled, Level of assistance: CGA and Min A, and Comments: shuffling/ festination increased with turns   FUNCTIONAL TESTS:  5 times sit to stand: 24.07 sec Timed up and go (TUG): 43.84 sec 10 meter walk test: 22.87 sec in 25 ft = 1.09 ft/sec Berg:  26/56 (Scores <45/56 indicate increased fall risk)                                                                                                                                 TREATMENT DATE: 01/23/2024    PATIENT EDUCATION: Education details: Eval results, POC; discussed benefits of OPPT versus HHPT Person educated: Patient and Spouse Education method: Explanation Education comprehension: verbalized understanding  HOME EXERCISE PROGRAM: Not yet initiated  GOALS: Goals reviewed with patient? Yes  SHORT TERM GOALS: Target  date: 02/15/2024  Pt will be supervision with HEP for improved strength, flexibility/decreased pain, balance. Baseline: Goal status: MET, 02/14/2024  2.  Pt will improve TUG score to less than or equal to 35 sec for decreased fall risk.  Baseline:  43 sec>30.13 sec at best with 4WW 02/25/2024  Goal status:  Met, 02/25/2024   LONG TERM GOALS: Target date: 03/07/2024>04/04/2024 (UPDATED TARGET)  Pt will be supervision with progression of HEP for improved strength, balance, transfers. Baseline:  Goal status: IN PROGRESS  2.  Pt will improve 5x sit<>stand to less than or equal to 18 sec to demonstrate improved functional strength and transfer efficiency.  Baseline: 24 sec>22.44 sec 03/03/2024 Goal status: IN PROGRESS  3.  Pt will improve gait velocity to at least 1.8 ft/sec for improved gait efficiency and safety. Baseline: 1.07 ft/sec>1.74 ft/sec 03/03/2024 Goal status: IN PROGRESS  4.  Pt will improve Berg score to at least 38/56 to decrease fall risk. Baseline:  26/56 Goal status: IN PROGRESS   5.  Pt will verbalize understanding of fall prevention in home environment.  Baseline:  Goal status: IN PROGRESS  ASSESSMENT:  CLINICAL IMPRESSION: Techniques to facilitate postural awareness/control initially w/ tactile cue (physioball) for trunk extension progressing to unsupported standing and then to compliant surfaces to improve proprioceptive awareness.  Tendency fro progressive left lateral lean with fatigue and unsteadiness.  Gait strategies with counted steps over measured distance with good ability to maintain stride unless faced with divided attention. Continued sessions to advance POC details to improve mobility and reduce risk for falls  OBJECTIVE IMPAIRMENTS: Abnormal gait, decreased balance, decreased mobility, difficulty walking, decreased ROM, decreased strength, impaired flexibility, postural dysfunction, and pain.   ACTIVITY LIMITATIONS: standing, transfers, bed mobility, bathing, toileting, dressing, self feeding, reach over head, hygiene/grooming, and locomotion level  PARTICIPATION LIMITATIONS: meal prep, cleaning, laundry, shopping, community activity, and community fitness  PERSONAL FACTORS: 3+ comorbidities: see above are also affecting patient's functional outcome.   REHAB POTENTIAL: Good  CLINICAL DECISION MAKING: Evolving/moderate complexity  EVALUATION COMPLEXITY: Moderate  PLAN:  PT FREQUENCY: 1x/week  PT DURATION: 4 weeks following recert 03/03/2024  PLANNED INTERVENTIONS: 97750- Physical Performance Testing, 97110-Therapeutic exercises, 97530- Therapeutic activity, 97112- Neuromuscular re-education, 97535- Self Care, 02859- Manual therapy, [phone]- Gait training, Patient/Family education, and Balance training  PLAN FOR NEXT SESSION: Ask about side step and backwards step added to HEP.  Ask about adding additional appts through 11/14 or is pt ready to wrap up PT by end of October? Work on flexibility, sit to stand, gait  technique, postural reeducation; SLS, standing balance   Jonette MARLA Sandifer, PT 03/17/2024, 2:52 PM  [facility] at [facility] [address] [place], [address] [place], [place] [address] Phone # [phone] Fax # [phone]

## 2024-03-19 ENCOUNTER — Ambulatory Visit: Payer: Self-pay

## 2024-03-19 ENCOUNTER — Ambulatory Visit

## 2024-03-19 DIAGNOSIS — R471 Dysarthria and anarthria: Secondary | ICD-10-CM

## 2024-03-19 DIAGNOSIS — R2689 Other abnormalities of gait and mobility: Secondary | ICD-10-CM

## 2024-03-19 DIAGNOSIS — R4701 Aphasia: Secondary | ICD-10-CM

## 2024-03-19 DIAGNOSIS — R41841 Cognitive communication deficit: Secondary | ICD-10-CM

## 2024-03-19 DIAGNOSIS — R2681 Unsteadiness on feet: Secondary | ICD-10-CM

## 2024-03-19 DIAGNOSIS — R1310 Dysphagia, unspecified: Secondary | ICD-10-CM

## 2024-03-19 DIAGNOSIS — R293 Abnormal posture: Secondary | ICD-10-CM | POA: Diagnosis not present

## 2024-03-19 DIAGNOSIS — R29818 Other symptoms and signs involving the nervous system: Secondary | ICD-10-CM

## 2024-03-19 DIAGNOSIS — M6281 Muscle weakness (generalized): Secondary | ICD-10-CM

## 2024-03-19 DIAGNOSIS — G20A2 Parkinson's disease without dyskinesia, with fluctuations: Secondary | ICD-10-CM

## 2024-03-19 NOTE — Therapy (Signed)
 OUTPATIENT SPEECH LANGUAGE PATHOLOGY PARKINSON'S TREATMENT   Patient Name: Matthew [name] MRN: [id number] DOB:1950-12-06, 73 y.o., male Today's Date: 03/19/2024  PCP: Okey Dunnings, MD REFERRING PROVIDER: Evonnie Stabs, DO  END OF SESSION:  End of Session - 03/19/24 1758     Visit Number 6    Number of Visits 13    Date for Recertification  04/04/24    SLP Start Time 1446    SLP Stop Time  1529    SLP Time Calculation (min) 43 min    Activity Tolerance Patient tolerated treatment well              Past Medical History:  Diagnosis Date   Anxiety    Arthritis    GERD (gastroesophageal reflux disease)    occ remote history   Hypertension    Parkinson disease (HCC)    Prostate cancer (HCC)    Right knee pain    questionable meniscal tear   Seasonal allergies    Past Surgical History:  Procedure Laterality Date   COLONOSCOPY     Leg Laceration Repair  Left    Age 3   PELVIC LYMPH NODE DISSECTION Bilateral 03/22/2018   Procedure: PELVIC LYMPH NODE DISSECTION;  Surgeon: Devere Lonni Righter, MD;  Location: [facility];  Service: Urology;  Laterality: Bilateral;   PROSTATE BIOPSY     ROBOT ASSISTED LAPAROSCOPIC RADICAL PROSTATECTOMY N/A 03/22/2018   Procedure: XI ROBOTIC ASSISTED LAPAROSCOPIC PROSTATECTOMY;  Surgeon: Devere Lonni Righter, MD;  Location: [facility];  Service: Urology;  Laterality: N/A;   TOTAL KNEE ARTHROPLASTY Right 05/05/2019   Procedure: RIGHT TOTAL KNEE ARTHROPLASTY;  Surgeon: Liam Lerner, MD;  Location: [facility];  Service: Orthopedics;  Laterality: Right;   Patient Active Problem List   Diagnosis Date Noted   S/P TKR (total knee replacement), right 05/05/2019   Osteoarthritis of right knee 05/02/2019   Prostate cancer (HCC) 03/22/2018   Malignant neoplasm of prostate (HCC) 02/27/2018    ONSET DATE: script dated 11/19/23  REFERRING DIAG: Parkinson's Disease  THERAPY DIAG:  Dysarthria and anarthria  Cognitive communication  deficit  Aphasia  Dysphagia, unspecified type  Rationale for Evaluation and Treatment: Rehabilitation  SUBJECTIVE:   SUBJECTIVE STATEMENT: I don't know why but I'm dizzy today.  Pt accompanied by: self   PERTINENT HISTORY: Pt well-known to this SLP from previous courses of ST. MBS in August 2023- results below. Pt's last ST course resulted in pt partially meeting goal for producing speech in upper 60s dB in short conversational segments.  PAIN:  Are you having pain? No  FALLS: Has patient fallen in last 6 months?  See PT evaluation for details   PATIENT GOALS: Improve loudness and swallowing  OBJECTIVE:   DIAGNOSTIC FINDINGS:  MBS 12-22-21 Patient presents with functional oropharyngeal swallowing without aspiration of any consistency tested. He does demonstrate minimal asymptomatic laryngeal penetration of thin liquid due to decreased timing of laryngeal closure. Chin tuck posture with use of straw effective to prevent penetration. Pharyngeal swallow is strong without retention. Oral transiting discoordination noted with pt swallowing barium tablet as he required 2nd bolus of thin to transit through oropharynx. Upon esophageal sweep, pt appeared with barium tablet retained in esophagus with pt awareness. Tablet appeared to transit distally after several boluses of liquids. After tablet had transited, pt continued with "phantom" sensation to esophagus. Recommend pt continue diet as tolerated, using chin tuck with liquids if improves comfort, decreases cough. Also encouraged pt to assure maintains strength of voice, cough and  expectoration for airway protection.      PATIENT REPORTED OUTCOME MEASURES (PROM): Communication Effectiveness Survey: returned 03/19/24 with pt scoring himself 16/32 with higher scores indicating more effective communication. '1 (not at all effective) with conversation in noisy environment, and conversation with someone across the room.   TODAY'S  TREATMENT:                                                                                                                                         DATE:   03/19/24: Pt's fatigue level commensurate with previous session. Pt states he feels more out of it today. Pt's everyday sentences were average 84dB. Pt req'd usual mod A for sentence responses with louder voice. Limited/no carryover to conversational speech. Homework with loud /a/ and everyday sentences was strongly encouraged. PT drinking with a straw today without overt s/sx pharyngeal dysphagia.   03/17/24: Pt cont looking more fatigued today - comparable to previous session. Loud /a/ average was 85dB with usual cues for maintaining loudness. Sentences averaged 83dB. SLP guided pt in short conversational segments of 30 seconds-45 seconds, with usual mod A for loudness faded to usual min A for loudness. Loudness fade was prevalent.   03/10/24: Pt appears more tired today than he has the previous 2-3 sessions. Loud /a/ was average 84 dB, with sentences averaged 83dB. SLP then worked with pt with 30-45 second conversational segments with occasional mod-max cues for loudness; Significant loudness fade noted today especially in last 15 minutes of session. SLP cued pt x4 to swallow saliva during session as anterior labial leakage occurred x2 with pt unaware.   03/05/24: PT NEEDS PROM NEXT SESSION. Today SLP used 15-30 second conversational segments to help pt make louder speech more of a habit. Pt req'd usual mod A for louder speech after 8 seconds. This decr'd to occasional min-mod A after 7 trials.  With pt's loud /a/ he produced average 88dB with occasional mod A for loudness and inhibiting loudness fade. Everyday sentences were produced with average 82dB. Pt was provided homework for loudness.  03/03/24: Pt needs PROM next session. SLP assisted pt in generating new set of everyday sentences. Today pt req'd mod A usually to improve loudness decay in  conversation over 8 seconds. SLP had pt third/last today so this may have played a role in this. Upon getting into lobby, pt's loudness decr'd immediately.   01/23/24: After evaluation, pt, SLP, and wife discussed pt's options for therapy at this time, with SLP collaborating with pt and wife what SLP thinks would be most profitable for pt given his input, wife's input, and the deficits highlighted with the evaluation completed today. A MBS is recommended.   PATIENT EDUCATION: Education details: see today's treatment Person educated: Patient and Spouse Education method: Explanation and Demonstration Education comprehension: verbalized understanding  HOME EXERCISE PROGRAM: TBD    GOALS: Goals reviewed with patient? No  SHORT TERM GOALS: Target date: 03/21/24-given date of first ST  Produce /a/ with average upper 80s dB over three sessions Baseline: Goal status: INITIAL  2.  pt will generate 3 minutes simple conversation with upper 60s dB average given occasional min A in 3 sessions  Baseline:  Goal status: INITIAL  3.  pt will improve verbal expression to a functional volume when cued by [name] in 3 sessions  Baseline:  Goal status: INITIAL  4.  If necessary, pt will use strategies provided during MBS with written cues in 3 sessions Baseline:  Goal status: INITIAL   LONG TERM GOALS: Target date: 04/04/24  pt will produce average upper 60s dB in 5 minutes simple conversation in 3 sessions  Baseline:  Goal status: INITIAL  2.   pt will improve verbal expression to a functional volume when cued by [name] in 3 sessions, after 03/21/24 Baseline:  Goal status: INITIAL  3.  pt will report fewer requests to repeat himself than prior to ST in 3 sessions Baseline:  Goal status: INITIAL  4.   Pt will use strategies for safer swallowing in 3 sessions with modified independence  Baseline:  Goal status: INITIAL  5.  Pt will score higher/better on PROM in the last 2 weeks of  therapy Baseline:  Goal status: INITIAL   ASSESSMENT:  CLINICAL IMPRESSION: Patient is a 73 y.o. male who was seen today for treatment of dysarthria and in light of stage V Parkinson's Disease. See treatment date above for today's date for further details on today's session.  During eval, pt was able to improve loudness in simple conversation for 90 seconds with cues from SLP and thus is a good candidate for ST.  Pt would benefit from MBS, one will be suggested to referring MD.  OBJECTIVE IMPAIRMENTS: Objective impairments include executive functioning, aphasia, dysarthria, and dysphagia. These impairments are limiting patient from household responsibilities, ADLs/IADLs, effectively communicating at home and in community, and safety when swallowing.Factors affecting potential to achieve goals and functional outcome are none.. Patient will benefit from skilled SLP services to address above impairments and improve overall function.  REHAB POTENTIAL: Good  PLAN:  SLP FREQUENCY: 1-2x/week  SLP DURATION: 6 weeks  PLANNED INTERVENTIONS: Aspiration precaution training, Diet toleration management , Environmental controls, Cueing hierachy, Cognitive reorganization, Internal/external aids, Oral motor exercises, Functional tasks, Multimodal communication approach, SLP instruction and feedback, Compensatory strategies, and Patient/family education    [facility], CCC-SLP 03/19/2024, 5:58 PM

## 2024-03-19 NOTE — Therapy (Signed)
 OUTPATIENT PHYSICAL THERAPY NEURO TREATMENT NOTE   Patient Name: Matthew [name] MRN: [id number] DOB:1951-05-15, 73 y.o., male Today's Date: 03/19/2024   PCP: Okey Carlin Redbird, MD REFERRING PROVIDER: Tat, Asberry RAMAN, DO    END OF SESSION:  PT End of Session - 03/19/24 1411     Visit Number 11    Number of Visits 12    Date for Recertification  04/04/24    Authorization Type HTA    Progress Note Due on Visit 18   PN completed visit 8   PT Start Time 1406    PT Stop Time 1445    PT Time Calculation (min) 39 min    Equipment Utilized During Treatment Gait belt    Activity Tolerance Patient tolerated treatment well    Behavior During Therapy WFL for tasks assessed/performed               Past Medical History:  Diagnosis Date   Anxiety    Arthritis    GERD (gastroesophageal reflux disease)    occ remote history   Hypertension    Parkinson disease (HCC)    Prostate cancer (HCC)    Right knee pain    questionable meniscal tear   Seasonal allergies    Past Surgical History:  Procedure Laterality Date   COLONOSCOPY     Leg Laceration Repair  Left    Age 52   PELVIC LYMPH NODE DISSECTION Bilateral 03/22/2018   Procedure: PELVIC LYMPH NODE DISSECTION;  Surgeon: Devere Lonni Righter, MD;  Location: [facility];  Service: Urology;  Laterality: Bilateral;   PROSTATE BIOPSY     ROBOT ASSISTED LAPAROSCOPIC RADICAL PROSTATECTOMY N/A 03/22/2018   Procedure: XI ROBOTIC ASSISTED LAPAROSCOPIC PROSTATECTOMY;  Surgeon: Devere Lonni Righter, MD;  Location: [facility];  Service: Urology;  Laterality: N/A;   TOTAL KNEE ARTHROPLASTY Right 05/05/2019   Procedure: RIGHT TOTAL KNEE ARTHROPLASTY;  Surgeon: Liam Lerner, MD;  Location: [facility];  Service: Orthopedics;  Laterality: Right;   Patient Active Problem List   Diagnosis Date Noted   S/P TKR (total knee replacement), right 05/05/2019   Osteoarthritis of right knee 05/02/2019   Prostate cancer (HCC) 03/22/2018   Malignant  neoplasm of prostate (HCC) 02/27/2018    ONSET DATE: MD referral 11/19/2023  REFERRING DIAG:  G20.A2 (ICD-10-CM) - Parkinson's disease without dyskinesia, with fluctuating manifestations (HCC)  R49.8 (ICD-10-CM) - Hypophonia    THERAPY DIAG:  Muscle weakness (generalized)  Unsteadiness on feet  Abnormal posture  Other abnormalities of gait and mobility  Parkinson's disease without dyskinesia, with fluctuating manifestations (HCC)  Other symptoms and signs involving the nervous system  Rationale for Evaluation and Treatment: Rehabilitation  SUBJECTIVE:  SUBJECTIVE STATEMENT: Having some pain/tension in the left bicep  Pt accompanied by: significant other  PERTINENT HISTORY: Parkinson's, R TKR, prostate Ca, HTN  PAIN:  No PRECAUTIONS: Fall and Other: hx of neurogenic orthostatic hypotension  RED FLAGS: None   WEIGHT BEARING RESTRICTIONS: No  FALLS: Has patient fallen in last 6 months? Several falls; less falls since hospital bed; may 1 fall every 1-2 months Feels like he may lose balance backwards and wife provides very close supervision  LIVING ENVIRONMENT: Lives with: lives with their family Lives in: House/apartment Stairs: 2-3 steps to enter Has following equipment at home: Vannie - 2 wheeled, Environmental Consultant - 4 wheeled, Wheelchair (manual), and hospital bed and manual w/c and transport w/c; grab bars in shower. BSC, lift chair.  Has a lower extremity pedaler at home (since knee surgery)  PLOF: Needs assistance with gait, Needs assistance with transfers, and Leisure: does voice exercises; does PWR! Moves exercises (about 15-20 min/day), dance class online, chair ex on [employer] (Sit and Be Fit)  PATIENT GOALS: To get a better exercise routine compared to what I'm doing now.  To try to figure  out what he can do for exercise and posture.  OBJECTIVE:   TODAY'S TREATMENT: 03/19/24 Activity Comments  NU-step speed intervals for neural priming x 8 min 2 min warm-up 30 sec interval (pt performing 80 SPM); 60 sec recovery pace Therapist-assist intervals x 30 sec 100+ SPM  Retro-walk/forward march x 2 min W/ // bars  Sidestep x 2 min W/ BUE support, cues for high step foot clearance  Knee extension 10# Tactile assist for tone inhibition to enable quad activiation           TODAY'S TREATMENT: 03/17/24 Activity Comments  Corner balance/coordination -physioball behind back: 10 reps PWR Up. Standing lumbar extension x 10 reps -standing on foam: EO/EC 3x30 sec, tactile cues for trunk extension. Alt toe taps 4 box 2x60 sec, CGA. Static stance foot elevated 2x30 sec  Gait training Trials w/ counted steps for stride length with good carryover. Attempts at increased speed with decreased stride. Trials w/ conversation going from 28 steps to cover 45 ft to 35 steps.                          Access Code: [email or web address] URL: [email or web address] Date: 02/11/2024; 03/03/2024 Prepared by: [facility] - Outpatient  Rehab - [facility] PWR! Moves modified quadruped x 10 Side step and backward step at weightshift at counter, 2 x 5 reps Exercises - Seated Flexion Stretch with Swiss Ball  - 1-2 x daily - 7 x weekly - 1 sets - 5 reps - Seated Thoracic Flexion and Rotation with Swiss Ball  - 1-2 x daily - 7 x weekly - 1 sets - 5 reps - Seated Hamstring Stretch  - 2 x daily - 7 x weekly - 1 sets - 3 reps - Wall Quarter Squat with Swiss Ball  - 1 x daily - 7 x weekly - 3 sets - 10 reps - Serratus Activation at [employer] with [employer] and Resistance Band  - 1 x daily - 7 x weekly - 3 sets - 10 reps  PATIENT EDUCATION: Education details:  Updated/additions to HEP; tips to reduce freezing episodes with gait Person educated: Patient and Spouse Education method: Explanation,  Demonstration, and Handouts Education comprehension: verbalized understanding and returned demonstration  ----------------------------------------------------- Note: Objective measures were completed at Evaluation unless otherwise noted.  DIAGNOSTIC FINDINGS: NA for this  episode  COGNITION: Overall cognitive status: Within functional limits for tasks assessed and slowed response time   POSTURE: rounded shoulders, forward head, posterior pelvic tilt, flexed trunk , and weight shift left  LOWER EXTREMITY ROM:     Active  Right Eval Left Eval  Hip flexion    Hip extension    Hip abduction    Hip adduction    Hip internal rotation    Hip external rotation    Knee flexion    Knee extension -35 -40  Ankle dorsiflexion    Ankle plantarflexion    Ankle inversion    Ankle eversion     (Blank rows = not tested)  LOWER EXTREMITY MMT:    MMT Right Eval Left Eval  Hip flexion 4 4  Hip extension    Hip abduction 4 4  Hip adduction 4 4  Hip internal rotation    Hip external rotation    Knee flexion 4 4  Knee extension 4 4  Ankle dorsiflexion 4 4  Ankle plantarflexion    Ankle inversion    Ankle eversion    (Blank rows = not tested)  BED MOBILITY:  Not tested  TRANSFERS: Sit to stand: CGA  Assistive device utilized: None     Stand to sit: CGA  Assistive device utilized: hands to reach to chair      GAIT: Findings: Gait Characteristics: step to pattern, decreased step length- Right, decreased step length- Left, knee flexed in stance- Right, knee flexed in stance- Left, shuffling, festinating, lateral lean- Left, poor foot clearance- Right, and poor foot clearance- Left, Distance walked: 40 ft, Assistive device utilized:Walker - 4 wheeled, Level of assistance: CGA and Min A, and Comments: shuffling/ festination increased with turns   FUNCTIONAL TESTS:  5 times sit to stand: 24.07 sec Timed up and go (TUG): 43.84 sec 10 meter walk test: 22.87 sec in 25 ft = 1.09  ft/sec Berg:  26/56 (Scores <45/56 indicate increased fall risk)                                                                                                                                 TREATMENT DATE: 01/23/2024    PATIENT EDUCATION: Education details: Eval results, POC; discussed benefits of OPPT versus HHPT Person educated: Patient and Spouse Education method: Explanation Education comprehension: verbalized understanding  HOME EXERCISE PROGRAM: Not yet initiated  GOALS: Goals reviewed with patient? Yes  SHORT TERM GOALS: Target date: 02/15/2024  Pt will be supervision with HEP for improved strength, flexibility/decreased pain, balance. Baseline: Goal status: MET, 02/14/2024  2.  Pt will improve TUG score to less than or equal to 35 sec for decreased fall risk.  Baseline:  43 sec>30.13 sec at best with 4WW 02/25/2024  Goal status:  Met, 02/25/2024   LONG TERM GOALS: Target date: 03/07/2024>04/04/2024 (UPDATED TARGET)  Pt will be supervision with progression of HEP for improved strength, balance, transfers. Baseline:  Goal status: IN PROGRESS  2.  Pt will improve 5x sit<>stand to less than or equal to 18 sec to demonstrate improved functional strength and transfer efficiency.  Baseline: 24 sec>22.44 sec 03/03/2024 Goal status: IN PROGRESS  3.  Pt will improve gait velocity to at least 1.8 ft/sec for improved gait efficiency and safety. Baseline: 1.07 ft/sec>1.74 ft/sec 03/03/2024 Goal status: IN PROGRESS  4.  Pt will improve Berg score to at least 38/56 to decrease fall risk. Baseline: 26/56 Goal status: IN PROGRESS   5.  Pt will verbalize understanding of fall prevention in home environment.  Baseline:  Goal status: IN PROGRESS  ASSESSMENT:  CLINICAL IMPRESSION: Activities for improved motor control and coordination to reduce rigidity and enable large amplitude movements and single limb support.  Tactile cues/assist for optimal performance of speed and  amplitude. Continued sessions to progress POC details and incorporate caregiver training for carryover  OBJECTIVE IMPAIRMENTS: Abnormal gait, decreased balance, decreased mobility, difficulty walking, decreased ROM, decreased strength, impaired flexibility, postural dysfunction, and pain.   ACTIVITY LIMITATIONS: standing, transfers, bed mobility, bathing, toileting, dressing, self feeding, reach over head, hygiene/grooming, and locomotion level  PARTICIPATION LIMITATIONS: meal prep, cleaning, laundry, shopping, community activity, and community fitness  PERSONAL FACTORS: 3+ comorbidities: see above are also affecting patient's functional outcome.   REHAB POTENTIAL: Good  CLINICAL DECISION MAKING: Evolving/moderate complexity  EVALUATION COMPLEXITY: Moderate  PLAN:  PT FREQUENCY: 1x/week  PT DURATION: 4 weeks following recert 03/03/2024  PLANNED INTERVENTIONS: 97750- Physical Performance Testing, 97110-Therapeutic exercises, 97530- Therapeutic activity, 97112- Neuromuscular re-education, 97535- Self Care, [id number]- Manual therapy, [phone]- Gait training, Patient/Family education, and Balance training  PLAN FOR NEXT SESSION: Ask about side step and backwards step added to HEP.  Ask about adding additional appts through 11/14 or is pt ready to wrap up PT by end of October? Work on flexibility, sit to stand, gait technique, postural reeducation; SLS, standing balance   Jonette MARLA Sandifer, PT 03/19/2024, 2:12 PM  [facility] at [facility] [address] [place], [place] [address] Phone # [phone] Fax # [phone]

## 2024-03-24 ENCOUNTER — Ambulatory Visit: Attending: Neurology

## 2024-03-24 DIAGNOSIS — R2689 Other abnormalities of gait and mobility: Secondary | ICD-10-CM | POA: Insufficient documentation

## 2024-03-24 DIAGNOSIS — R471 Dysarthria and anarthria: Secondary | ICD-10-CM | POA: Insufficient documentation

## 2024-03-24 DIAGNOSIS — R1310 Dysphagia, unspecified: Secondary | ICD-10-CM | POA: Insufficient documentation

## 2024-03-24 DIAGNOSIS — R4701 Aphasia: Secondary | ICD-10-CM | POA: Diagnosis not present

## 2024-03-24 DIAGNOSIS — R2681 Unsteadiness on feet: Secondary | ICD-10-CM | POA: Diagnosis not present

## 2024-03-24 DIAGNOSIS — R293 Abnormal posture: Secondary | ICD-10-CM | POA: Diagnosis not present

## 2024-03-24 DIAGNOSIS — M6281 Muscle weakness (generalized): Secondary | ICD-10-CM | POA: Insufficient documentation

## 2024-03-24 DIAGNOSIS — R41841 Cognitive communication deficit: Secondary | ICD-10-CM | POA: Diagnosis not present

## 2024-03-24 NOTE — Therapy (Signed)
 OUTPATIENT SPEECH LANGUAGE PATHOLOGY PARKINSON'S TREATMENT   Patient Name: Matthew [name] MRN: [id number] DOB:[birth year], 73 y.o., male Today's Date: 03/24/2024  PCP: Okey Dunnings, MD REFERRING PROVIDER: Evonnie Stabs, DO  END OF SESSION:  End of Session - 03/24/24 1258     Visit Number 7    Number of Visits 13    Date for Recertification  04/04/24    SLP Start Time 1235    SLP Stop Time  1315    SLP Time Calculation (min) 40 min    Activity Tolerance Patient tolerated treatment well              Past Medical History:  Diagnosis Date   Anxiety    Arthritis    GERD (gastroesophageal reflux disease)    occ remote history   Hypertension    Parkinson disease (HCC)    Prostate cancer (HCC)    Right knee pain    questionable meniscal tear   Seasonal allergies    Past Surgical History:  Procedure Laterality Date   COLONOSCOPY     Leg Laceration Repair  Left    Age 49   PELVIC LYMPH NODE DISSECTION Bilateral 03/22/2018   Procedure: PELVIC LYMPH NODE DISSECTION;  Surgeon: Devere Lonni Righter, MD;  Location: [facility];  Service: Urology;  Laterality: Bilateral;   PROSTATE BIOPSY     ROBOT ASSISTED LAPAROSCOPIC RADICAL PROSTATECTOMY N/A 03/22/2018   Procedure: XI ROBOTIC ASSISTED LAPAROSCOPIC PROSTATECTOMY;  Surgeon: Devere Lonni Righter, MD;  Location: [facility];  Service: Urology;  Laterality: N/A;   TOTAL KNEE ARTHROPLASTY Right 05/05/2019   Procedure: RIGHT TOTAL KNEE ARTHROPLASTY;  Surgeon: Liam Lerner, MD;  Location: [facility];  Service: Orthopedics;  Laterality: Right;   Patient Active Problem List   Diagnosis Date Noted   S/P TKR (total knee replacement), right 05/05/2019   Osteoarthritis of right knee 05/02/2019   Prostate cancer (HCC) 03/22/2018   Malignant neoplasm of prostate (HCC) 02/27/2018    ONSET DATE: script dated 11/19/23  REFERRING DIAG: Parkinson's Disease  THERAPY DIAG:  Dysarthria and anarthria  Cognitive communication  deficit  Dysphagia, unspecified type  Aphasia  Rationale for Evaluation and Treatment: Rehabilitation  SUBJECTIVE:   SUBJECTIVE STATEMENT: I'm going to make a [place] for you.  Pt accompanied by: self   PERTINENT HISTORY: Pt well-known to this SLP from previous courses of ST. MBS in August 2023- results below. Pt's last ST course resulted in pt partially meeting goal for producing speech in upper 60s dB in short conversational segments.  PAIN:  Are you having pain? No  FALLS: Has patient fallen in last 6 months?  See PT evaluation for details   PATIENT GOALS: Improve loudness and swallowing  OBJECTIVE:   DIAGNOSTIC FINDINGS:  MBS 12-22-21 Patient presents with functional oropharyngeal swallowing without aspiration of any consistency tested. He does demonstrate minimal asymptomatic laryngeal penetration of thin liquid due to decreased timing of laryngeal closure. Chin tuck posture with use of straw effective to prevent penetration. Pharyngeal swallow is strong without retention. Oral transiting discoordination noted with pt swallowing barium tablet as he required 2nd bolus of thin to transit through oropharynx. Upon esophageal sweep, pt appeared with barium tablet retained in esophagus with pt awareness. Tablet appeared to transit distally after several boluses of liquids. After tablet had transited, pt continued with "phantom" sensation to esophagus. Recommend pt continue diet as tolerated, using chin tuck with liquids if improves comfort, decreases cough. Also encouraged pt to assure maintains strength of voice,  cough and expectoration for airway protection.      PATIENT REPORTED OUTCOME MEASURES (PROM): Communication Effectiveness Survey: returned 03/19/24 with pt scoring himself 16/32 with higher scores indicating more effective communication. '1 (not at all effective) with conversation in noisy environment, and conversation with someone across the room.   TODAY'S  TREATMENT:                                                                                                                                         DATE:   03/24/24: Pt reports wife has to continually ask him to repeat; Rarely to occasionally he will use a louder voice. Straw drinking today without any overt s/sx oropharyngeal dysphagia. Pt rates his fatigue level today 6/10 (10=most fatigued). In everyday sentences pt averaged 83dB, usual mod A needed for incr'd volume. In conversation of 1-2 minutes pt produced average 68dB with occasional min-mod A for loudness. This increased to mod A, occasionally.   03/19/24: Pt's fatigue level commensurate with previous session. Pt states he feels more out of it today. Pt's everyday sentences were average 84dB. Pt req'd usual mod A for sentence responses with louder voice. Limited/no carryover to conversational speech. Homework with loud /a/ and everyday sentences was strongly encouraged. Pt drinking with a straw today without overt s/sx pharyngeal dysphagia.   03/17/24: Pt cont looking more fatigued today - comparable to previous session. Loud /a/ average was 85dB with usual cues for maintaining loudness. Sentences averaged 83dB. SLP guided pt in short conversational segments of 30 seconds-45 seconds, with usual mod A for loudness faded to usual min A for loudness. Loudness fade was prevalent.   03/10/24: Pt appears more tired today than he has the previous 2-3 sessions. Loud /a/ was average 84 dB, with sentences averaged 83dB. SLP then worked with pt with 30-45 second conversational segments with occasional mod-max cues for loudness; Significant loudness fade noted today especially in last 15 minutes of session. SLP cued pt x4 to swallow saliva during session as anterior labial leakage occurred x2 with pt unaware.   03/05/24: PT NEEDS PROM NEXT SESSION. Today SLP used 15-30 second conversational segments to help pt make louder speech more of a habit. Pt req'd  usual mod A for louder speech after 8 seconds. This decr'd to occasional min-mod A after 7 trials.  With pt's loud /a/ he produced average 88dB with occasional mod A for loudness and inhibiting loudness fade. Everyday sentences were produced with average 82dB. Pt was provided homework for loudness.  03/03/24: Pt needs PROM next session. SLP assisted pt in generating new set of everyday sentences. Today pt req'd mod A usually to improve loudness decay in conversation over 8 seconds. SLP had pt third/last today so this may have played a role in this. Upon getting into lobby, pt's loudness decr'd immediately.   01/23/24: After evaluation, pt, SLP, and wife discussed pt's options for  therapy at this time, with SLP collaborating with pt and wife what SLP thinks would be most profitable for pt given his input, wife's input, and the deficits highlighted with the evaluation completed today. A MBS is recommended.   PATIENT EDUCATION: Education details: see today's treatment Person educated: Patient and Spouse Education method: Medical Illustrator Education comprehension: verbalized understanding  HOME EXERCISE PROGRAM: Loud /a/, everyday sentences    GOALS: Goals reviewed with patient? No  SHORT TERM GOALS: Target date: 03/21/24-given date of first ST  Produce /a/ with average upper 80s dB over three sessions Baseline: Goal status: not met  2.  pt will generate 3 minutes simple conversation with upper 60s dB average given occasional min A in 3 sessions  Baseline:  Goal status: not met  3.  pt will improve verbal expression to a functional volume when cued by [name] in 3 sessions  Baseline:  Goal status: not met  4.  If necessary, pt will use strategies provided during MBS with written cues in 3 sessions Baseline:  Goal status: deferred - no overt s/sx oropharyngeal dysphagia   LONG TERM GOALS: Target date: 04/04/24  pt will produce average upper 60s dB in 3 minutes simple  conversation in 3 sessions  Baseline:  Goal status: Modified  2.   pt will report in two sessions an improvement in speech volume when cued by [name], occasionally, after 03/21/24 Baseline:  Goal status: INITIAL  3.  pt will report fewer requests to repeat himself than prior to ST Baseline:  Goal status: INITIAL  4.   Pt will use strategies for safer swallowing in 3 sessions with modified independence  Baseline:  Goal status: deferred - no overt s/sx oropharyngeal dysphagia  5.  Pt will score higher/better on PROM in the last 2 weeks of therapy Baseline:  Goal status: INITIAL   ASSESSMENT:  CLINICAL IMPRESSION: STGs checked today. Pt did not meet STGs, primarily due to severity of deficit and follow through of HEP.  LTGs adjusted based on this. Patient is a 73 y.o. male who was seen today for treatment of dysarthria and in light of stage V Parkinson's Disease. See treatment date above for today's date for further details on today's session.  During eval, pt was able to improve loudness in simple conversation for 90 seconds with cues from SLP and thus is a good candidate for ST.  Pt would benefit from MBS, one will be suggested to referring MD.  OBJECTIVE IMPAIRMENTS: Objective impairments include executive functioning, aphasia, dysarthria, and dysphagia. These impairments are limiting patient from household responsibilities, ADLs/IADLs, effectively communicating at home and in community, and safety when swallowing.Factors affecting potential to achieve goals and functional outcome are none.. Patient will benefit from skilled SLP services to address above impairments and improve overall function.  REHAB POTENTIAL: Good  PLAN:  SLP FREQUENCY: 1-2x/week  SLP DURATION: 6 weeks  PLANNED INTERVENTIONS: Aspiration precaution training, Diet toleration management , Environmental controls, Cueing hierachy, Cognitive reorganization, Internal/external aids, Oral motor exercises, Functional  tasks, Multimodal communication approach, SLP instruction and feedback, Compensatory strategies, and Patient/family education    [facility] [place], CCC-SLP 03/24/2024, 1:01 PM

## 2024-03-26 ENCOUNTER — Other Ambulatory Visit: Payer: Self-pay | Admitting: Neurology

## 2024-03-26 DIAGNOSIS — G20A2 Parkinson's disease without dyskinesia, with fluctuations: Secondary | ICD-10-CM

## 2024-03-28 NOTE — Therapy (Signed)
 OUTPATIENT PHYSICAL THERAPY NEURO DISCHARGE  Patient Name: Matthew [name] MRN: [id number] DOB:[birth year], 73 y.o., male Today's Date: 03/31/2024   PCP: Okey Carlin Redbird, MD REFERRING PROVIDER: Evonnie Asberry RAMAN, DO   Progress Note Reporting Period 03/10/24 to 03/31/24  See note below for Objective Data and Assessment of Progress/Goals.     END OF SESSION:  PT End of Session - 03/31/24 1236     Visit Number 12    Number of Visits 12    Date for Recertification  04/04/24    Authorization Type HTA    Progress Note Due on Visit 18   PN completed visit 8   PT Start Time 1151   pt late   PT Stop Time 1230    PT Time Calculation (min) 39 min    Equipment Utilized During Treatment Gait belt    Activity Tolerance Patient tolerated treatment well    Behavior During Therapy WFL for tasks assessed/performed                Past Medical History:  Diagnosis Date   Anxiety    Arthritis    GERD (gastroesophageal reflux disease)    occ remote history   Hypertension    Parkinson disease (HCC)    Prostate cancer (HCC)    Right knee pain    questionable meniscal tear   Seasonal allergies    Past Surgical History:  Procedure Laterality Date   COLONOSCOPY     Leg Laceration Repair  Left    Age 71   PELVIC LYMPH NODE DISSECTION Bilateral 03/22/2018   Procedure: PELVIC LYMPH NODE DISSECTION;  Surgeon: Devere Lonni Righter, MD;  Location: [facility];  Service: Urology;  Laterality: Bilateral;   PROSTATE BIOPSY     ROBOT ASSISTED LAPAROSCOPIC RADICAL PROSTATECTOMY N/A 03/22/2018   Procedure: XI ROBOTIC ASSISTED LAPAROSCOPIC PROSTATECTOMY;  Surgeon: Devere Lonni Righter, MD;  Location: [facility];  Service: Urology;  Laterality: N/A;   TOTAL KNEE ARTHROPLASTY Right 05/05/2019   Procedure: RIGHT TOTAL KNEE ARTHROPLASTY;  Surgeon: Liam Lerner, MD;  Location: [facility];  Service: Orthopedics;  Laterality: Right;   Patient Active Problem List   Diagnosis Date Noted   S/P TKR  (total knee replacement), right 05/05/2019   Osteoarthritis of right knee 05/02/2019   Prostate cancer (HCC) 03/22/2018   Malignant neoplasm of prostate (HCC) 02/27/2018    ONSET DATE: MD referral 11/19/2023  REFERRING DIAG:  G20.A2 (ICD-10-CM) - Parkinson's disease without dyskinesia, with fluctuating manifestations (HCC)  R49.8 (ICD-10-CM) - Hypophonia    THERAPY DIAG:  Muscle weakness (generalized)  Unsteadiness on feet  Abnormal posture  Other abnormalities of gait and mobility  Rationale for Evaluation and Treatment: Rehabilitation  SUBJECTIVE:  SUBJECTIVE STATEMENT: It's not my muscles, I'm just cold.   Pt accompanied by: significant other in waiting room  PERTINENT HISTORY: Parkinson's, R TKR, prostate Ca, HTN  PAIN: No  PRECAUTIONS: Fall and Other: hx of neurogenic orthostatic hypotension  RED FLAGS: None   WEIGHT BEARING RESTRICTIONS: No  FALLS: Has patient fallen in last 6 months? Several falls; less falls since hospital bed; may 1 fall every 1-2 months Feels like he may lose balance backwards and wife provides very close supervision  LIVING ENVIRONMENT: Lives with: lives with their family Lives in: House/apartment Stairs: 2-3 steps to enter Has following equipment at home: Vannie - 2 wheeled, Environmental Consultant - 4 wheeled, Wheelchair (manual), and hospital bed and manual w/c and transport w/c; grab bars in shower. BSC, lift chair.  Has a lower extremity pedaler at home (since knee surgery)  PLOF: Needs assistance with gait, Needs assistance with transfers, and Leisure: does voice exercises; does PWR! Moves exercises (about 15-20 min/day), dance class online, chair ex on [employer] (Sit and Be Fit)  PATIENT GOALS: To get a better exercise routine compared to what I'm doing now.  To try  to figure out what he can do for exercise and posture.  OBJECTIVE:     TODAY'S TREATMENT: 03/31/24 Activity Comments  Gait into clinic with [phone] Cues for longer steps, locking brakes when picking item from th floor (pt dropped his tissue)  Dynamic warm up in chair Lead my PT for mobility and stretching   5xSTS 19.91 sec  No UE support; posterior LOB on 1st rep  62M walk 17.13 sec with 4WW (1.91 ft/sec)  [employer]          [facility] PT Assessment - 03/31/24 0001       Berg Balance Test   Sit to Stand Able to stand  independently using hands    Standing Unsupported Able to stand 2 minutes with supervision    Sitting with Back Unsupported but Feet Supported on Floor or Stool Able to sit safely and securely 2 minutes    Stand to Sit Controls descent by using hands    Transfers Able to transfer safely, definite need of hands    Standing Unsupported with Eyes Closed Able to stand 10 seconds with supervision    Standing Unsupported with Feet Together Able to place feet together independently and stand for 1 minute with supervision    From Standing, Reach Forward with Outstretched Arm Reaches forward but needs supervision    From Standing Position, Pick up Object from Floor Able to pick up shoe, needs supervision    From Standing Position, Turn to Look Behind Over each Shoulder Needs supervision when turning    Turn 360 Degrees Needs close supervision or verbal cueing    Standing Unsupported, Alternately Place Feet on Step/Stool Needs assistance to keep from falling or unable to try    Standing Unsupported, One Foot in Front Able to take small step independently and hold 30 seconds    Standing on One Leg Unable to try or needs assist to prevent fall    Total Score 30           PATIENT EDUCATION: Education details: discussed PD screens in 3-6 months d/t severity of PD progression; edu on fall prevention, review of HEP program  and his PWR moves routine, edu on exam findings and progress  towards goals  Person educated: Patient Education method: Explanation, Demonstration, Tactile cues, Verbal cues, and Handouts Education comprehension: verbalized understanding and returned  demonstration      Access Code: [email or web address] URL: [email or web address] Date: 02/11/2024; 03/03/2024 Prepared by: [facility] - Outpatient  Rehab - [facility] PWR! Moves modified quadruped x 10 Side step and backward step at weightshift at counter, 2 x 5 reps Exercises - Seated Flexion Stretch with Swiss Ball  - 1-2 x daily - 7 x weekly - 1 sets - 5 reps - Seated Thoracic Flexion and Rotation with Swiss Ball  - 1-2 x daily - 7 x weekly - 1 sets - 5 reps - Seated Hamstring Stretch  - 2 x daily - 7 x weekly - 1 sets - 3 reps - Wall Quarter Squat with Swiss Ball  - 1 x daily - 7 x weekly - 3 sets - 10 reps - Serratus Activation at [employer] with [employer] and Resistance Band  - 1 x daily - 7 x weekly - 3 sets - 10 reps   ----------------------------------------------------- Note: Objective measures were completed at Evaluation unless otherwise noted.  DIAGNOSTIC FINDINGS: NA for this episode  COGNITION: Overall cognitive status: Within functional limits for tasks assessed and slowed response time   POSTURE: rounded shoulders, forward head, posterior pelvic tilt, flexed trunk , and weight shift left  LOWER EXTREMITY ROM:     Active  Right Eval Left Eval  Hip flexion    Hip extension    Hip abduction    Hip adduction    Hip internal rotation    Hip external rotation    Knee flexion    Knee extension -35 -40  Ankle dorsiflexion    Ankle plantarflexion    Ankle inversion    Ankle eversion     (Blank rows = not tested)  LOWER EXTREMITY MMT:    MMT Right Eval Left Eval  Hip flexion 4 4  Hip extension    Hip abduction 4 4  Hip adduction 4 4  Hip internal rotation    Hip external rotation    Knee flexion 4 4  Knee extension 4 4  Ankle dorsiflexion 4 4  Ankle  plantarflexion    Ankle inversion    Ankle eversion    (Blank rows = not tested)  BED MOBILITY:  Not tested  TRANSFERS: Sit to stand: CGA  Assistive device utilized: None     Stand to sit: CGA  Assistive device utilized: hands to reach to chair      GAIT: Findings: Gait Characteristics: step to pattern, decreased step length- Right, decreased step length- Left, knee flexed in stance- Right, knee flexed in stance- Left, shuffling, festinating, lateral lean- Left, poor foot clearance- Right, and poor foot clearance- Left, Distance walked: 40 ft, Assistive device utilized:Walker - 4 wheeled, Level of assistance: CGA and Min A, and Comments: shuffling/ festination increased with turns   FUNCTIONAL TESTS:  5 times sit to stand: 24.07 sec Timed up and go (TUG): 43.84 sec 10 meter walk test: 22.87 sec in 25 ft = 1.09 ft/sec Berg:  26/56 (Scores <45/56 indicate increased fall risk)  [facility] PT Assessment - 03/31/24 0001       Berg Balance Test   Sit to Stand Able to stand  independently using hands    Standing Unsupported Able to stand 2 minutes with supervision    Sitting with Back Unsupported but Feet Supported on Floor or Stool Able to sit safely and securely 2 minutes    Stand to Sit Controls descent by using hands    Transfers Able to transfer safely, definite need of  hands    Standing Unsupported with Eyes Closed Able to stand 10 seconds with supervision    Standing Unsupported with Feet Together Able to place feet together independently and stand for 1 minute with supervision    From Standing, Reach Forward with Outstretched Arm Reaches forward but needs supervision    From Standing Position, Pick up Object from Floor Able to pick up shoe, needs supervision    From Standing Position, Turn to Look Behind Over each Shoulder Needs supervision when turning    Turn 360 Degrees Needs close supervision or verbal cueing    Standing Unsupported, Alternately Place Feet on Step/Stool Needs  assistance to keep from falling or unable to try    Standing Unsupported, One Foot in Front Able to take small step independently and hold 30 seconds    Standing on One Leg Unable to try or needs assist to prevent fall    Total Score 30                                                                                                                                        TREATMENT DATE: 01/23/2024    PATIENT EDUCATION: Education details: Eval results, POC; discussed benefits of OPPT versus HHPT Person educated: Patient and Spouse Education method: Explanation Education comprehension: verbalized understanding  HOME EXERCISE PROGRAM: Not yet initiated  GOALS: Goals reviewed with patient? Yes  SHORT TERM GOALS: Target date: 02/15/2024  Pt will be supervision with HEP for improved strength, flexibility/decreased pain, balance. Baseline: Goal status: MET, 02/14/2024  2.  Pt will improve TUG score to less than or equal to 35 sec for decreased fall risk.  Baseline:  43 sec>30.13 sec at best with 4WW 02/25/2024  Goal status:  Met, 02/25/2024   LONG TERM GOALS: Target date: 03/07/2024>04/04/2024 (UPDATED TARGET)  Pt will be supervision with progression of HEP for improved strength, balance, transfers. Baseline:  Goal status: MET 03/31/24  2.  Pt will improve 5x sit<>stand to less than or equal to 18 sec to demonstrate improved functional strength and transfer efficiency.  Baseline: 24 sec>22.44 sec 03/03/2024; 19.91 sec 03/31/24 Goal status: NOT MET 03/31/24  3.  Pt will improve gait velocity to at least 1.8 ft/sec for improved gait efficiency and safety. Baseline: 1.07 ft/sec>1.74 ft/sec 03/03/2024; 17.13 sec with 4WW (1.91 ft/sec) 03/31/24 Goal status: MET 03/31/24  4.  Pt will improve Berg score to at least 38/56 to decrease fall risk. Baseline: 26/56; 30/56 03/31/24 Goal status: NOT MET   5.  Pt will verbalize understanding of fall prevention in home environment.   Baseline:  Goal status: MET 03/31/24  ASSESSMENT:  CLINICAL IMPRESSION: Patient arrived to session without complaints. Session focused on goals check which revealed improved gait speed and this goal has been met. Transfer speed vis 5xSTS has improved but goal not quite met. Berg score improved to 30/56, indicating an increased risk of  falls. Recommended continue use of 4WW and provided edu on fall prevention. Patient has made progress towards goals. Now ready for DC with PD screen within coming months d/t progressive nature of diagnosis.   OBJECTIVE IMPAIRMENTS: Abnormal gait, decreased balance, decreased mobility, difficulty walking, decreased ROM, decreased strength, impaired flexibility, postural dysfunction, and pain.   ACTIVITY LIMITATIONS: standing, transfers, bed mobility, bathing, toileting, dressing, self feeding, reach over head, hygiene/grooming, and locomotion level  PARTICIPATION LIMITATIONS: meal prep, cleaning, laundry, shopping, community activity, and community fitness  PERSONAL FACTORS: 3+ comorbidities: see above are also affecting patient's functional outcome.   REHAB POTENTIAL: Good  CLINICAL DECISION MAKING: Evolving/moderate complexity  EVALUATION COMPLEXITY: Moderate  PLAN:  PT FREQUENCY: 1x/week  PT DURATION: 4 weeks following recert 03/03/2024  PLANNED INTERVENTIONS: 97750- Physical Performance Testing, 97110-Therapeutic exercises, 97530- Therapeutic activity, 97112- Neuromuscular re-education, 97535- Self Care, 02859- Manual therapy, [phone]- Gait training, Patient/Family education, and Balance training  PLAN FOR NEXT SESSION: DC at this time   PHYSICAL THERAPY DISCHARGE SUMMARY  Visits from Start of Care: 12  Current functional level related to goals / functional outcomes: See above clinical impression   Remaining deficits: Imbalance, gait deviations    Education / Equipment: HEP, fall prevention edu  Plan: Patient agrees to discharge.   Patient goals were partially met. Patient is being discharged due to meeting the stated rehab goals.         Louana Terrilyn Christians, PT, DPT 03/31/24 12:39 PM  [facility] at [facility] [address] [place], [address] [place], [place] [address] Phone # [phone] Fax # [phone]

## 2024-03-31 ENCOUNTER — Ambulatory Visit: Payer: Self-pay | Admitting: Physical Therapy

## 2024-03-31 ENCOUNTER — Ambulatory Visit

## 2024-03-31 ENCOUNTER — Encounter: Payer: Self-pay | Admitting: Physical Therapy

## 2024-03-31 DIAGNOSIS — R41841 Cognitive communication deficit: Secondary | ICD-10-CM

## 2024-03-31 DIAGNOSIS — R4701 Aphasia: Secondary | ICD-10-CM

## 2024-03-31 DIAGNOSIS — R293 Abnormal posture: Secondary | ICD-10-CM

## 2024-03-31 DIAGNOSIS — R2689 Other abnormalities of gait and mobility: Secondary | ICD-10-CM

## 2024-03-31 DIAGNOSIS — R1310 Dysphagia, unspecified: Secondary | ICD-10-CM

## 2024-03-31 DIAGNOSIS — M6281 Muscle weakness (generalized): Secondary | ICD-10-CM

## 2024-03-31 DIAGNOSIS — R2681 Unsteadiness on feet: Secondary | ICD-10-CM

## 2024-03-31 DIAGNOSIS — R471 Dysarthria and anarthria: Secondary | ICD-10-CM

## 2024-03-31 NOTE — Patient Instructions (Signed)

## 2024-03-31 NOTE — Therapy (Signed)
 OUTPATIENT SPEECH LANGUAGE PATHOLOGY PARKINSON'S TREATMENT   Patient Name: Matthew [name] MRN: [id number] DOB:1950-12-19, 73 y.o., male Today's Date: 03/31/2024  PCP: Okey Dunnings, MD REFERRING PROVIDER: Evonnie Stabs, DO  END OF SESSION:  End of Session - 03/31/24 1309     Visit Number 8    Number of Visits 13    Date for Recertification  04/04/24    SLP Start Time 1235    SLP Stop Time  1315    SLP Time Calculation (min) 40 min    Activity Tolerance Patient tolerated treatment well              Past Medical History:  Diagnosis Date   Anxiety    Arthritis    GERD (gastroesophageal reflux disease)    occ remote history   Hypertension    Parkinson disease (HCC)    Prostate cancer (HCC)    Right knee pain    questionable meniscal tear   Seasonal allergies    Past Surgical History:  Procedure Laterality Date   COLONOSCOPY     Leg Laceration Repair  Left    Age 67   PELVIC LYMPH NODE DISSECTION Bilateral 03/22/2018   Procedure: PELVIC LYMPH NODE DISSECTION;  Surgeon: Devere Lonni Righter, MD;  Location: [facility];  Service: Urology;  Laterality: Bilateral;   PROSTATE BIOPSY     ROBOT ASSISTED LAPAROSCOPIC RADICAL PROSTATECTOMY N/A 03/22/2018   Procedure: XI ROBOTIC ASSISTED LAPAROSCOPIC PROSTATECTOMY;  Surgeon: Devere Lonni Righter, MD;  Location: [facility];  Service: Urology;  Laterality: N/A;   TOTAL KNEE ARTHROPLASTY Right 05/05/2019   Procedure: RIGHT TOTAL KNEE ARTHROPLASTY;  Surgeon: Liam Lerner, MD;  Location: [facility];  Service: Orthopedics;  Laterality: Right;   Patient Active Problem List   Diagnosis Date Noted   S/P TKR (total knee replacement), right 05/05/2019   Osteoarthritis of right knee 05/02/2019   Prostate cancer (HCC) 03/22/2018   Malignant neoplasm of prostate (HCC) 02/27/2018    ONSET DATE: script dated 11/19/23  REFERRING DIAG: Parkinson's Disease  THERAPY DIAG:  Dysarthria and anarthria  Cognitive communication  deficit  Dysphagia, unspecified type  Aphasia  Rationale for Evaluation and Treatment: Rehabilitation  SUBJECTIVE:   SUBJECTIVE STATEMENT: Burnard said his worked. I'll make one for you too.  Pt accompanied by: self   PERTINENT HISTORY: Pt well-known to this SLP from previous courses of ST. MBS in August 2023- results below. Pt's last ST course resulted in pt partially meeting goal for producing speech in upper 60s dB in short conversational segments.  PAIN:  Are you having pain? No  FALLS: Has patient fallen in last 6 months?  See PT evaluation for details   PATIENT GOALS: Improve loudness and swallowing  OBJECTIVE:   DIAGNOSTIC FINDINGS:  MBS 12-22-21 Patient presents with functional oropharyngeal swallowing without aspiration of any consistency tested. He does demonstrate minimal asymptomatic laryngeal penetration of thin liquid due to decreased timing of laryngeal closure. Chin tuck posture with use of straw effective to prevent penetration. Pharyngeal swallow is strong without retention. Oral transiting discoordination noted with pt swallowing barium tablet as he required 2nd bolus of thin to transit through oropharynx. Upon esophageal sweep, pt appeared with barium tablet retained in esophagus with pt awareness. Tablet appeared to transit distally after several boluses of liquids. After tablet had transited, pt continued with "phantom" sensation to esophagus. Recommend pt continue diet as tolerated, using chin tuck with liquids if improves comfort, decreases cough. Also encouraged pt to assure maintains strength of voice,  cough and expectoration for airway protection.      PATIENT REPORTED OUTCOME MEASURES (PROM): Communication Effectiveness Survey: returned 03/19/24 with pt scoring himself 16/32 with higher scores indicating more effective communication. '1 (not at all effective) with conversation in noisy environment, and conversation with someone across the room.    TODAY'S TREATMENT:                                                                                                                                         DATE:   03/31/24: Noralyn of water  today without overt s/sx pharyngeal dysphagia. Pt did have some anterior labial leakage during session today and when cued, swallowed to clear. Pt completed everyday sentences with average 81dB. In conversational topics of interest, pt maintained speech volume at 69dB with occasional mod A. Suspect at this stage of his PD this will be his max potential. Likely d/c next session.   03/24/24: Pt reports wife has to continually ask him to repeat; Rarely to occasionally he will use a louder voice. Straw drinking today without any overt s/sx oropharyngeal dysphagia. Pt rates his fatigue level today 6/10 (10=most fatigued). In everyday sentences pt averaged 83dB, usual mod A needed for incr'd volume. In conversation of 1-2 minutes pt produced average 68dB with occasional min-mod A for loudness. This increased to mod A, occasionally.   03/19/24: Pt's fatigue level commensurate with previous session. Pt states he feels more out of it today. Pt's everyday sentences were average 84dB. Pt req'd usual mod A for sentence responses with louder voice. Limited/no carryover to conversational speech. Homework with loud /a/ and everyday sentences was strongly encouraged. Pt drinking with a straw today without overt s/sx pharyngeal dysphagia.   03/17/24: Pt cont looking more fatigued today - comparable to previous session. Loud /a/ average was 85dB with usual cues for maintaining loudness. Sentences averaged 83dB. SLP guided pt in short conversational segments of 30 seconds-45 seconds, with usual mod A for loudness faded to usual min A for loudness. Loudness fade was prevalent.   03/10/24: Pt appears more tired today than he has the previous 2-3 sessions. Loud /a/ was average 84 dB, with sentences averaged 83dB. SLP then worked with pt  with 30-45 second conversational segments with occasional mod-max cues for loudness; Significant loudness fade noted today especially in last 15 minutes of session. SLP cued pt x4 to swallow saliva during session as anterior labial leakage occurred x2 with pt unaware.   03/05/24: PT NEEDS PROM NEXT SESSION. Today SLP used 15-30 second conversational segments to help pt make louder speech more of a habit. Pt req'd usual mod A for louder speech after 8 seconds. This decr'd to occasional min-mod A after 7 trials.  With pt's loud /a/ he produced average 88dB with occasional mod A for loudness and inhibiting loudness fade. Everyday sentences were produced with average 82dB. Pt was provided homework for loudness.  03/03/24: Pt needs PROM next session. SLP assisted pt in generating new set of everyday sentences. Today pt req'd mod A usually to improve loudness decay in conversation over 8 seconds. SLP had pt third/last today so this may have played a role in this. Upon getting into lobby, pt's loudness decr'd immediately.   01/23/24: After evaluation, pt, SLP, and wife discussed pt's options for therapy at this time, with SLP collaborating with pt and wife what SLP thinks would be most profitable for pt given his input, wife's input, and the deficits highlighted with the evaluation completed today. A MBS is recommended.   PATIENT EDUCATION: Education details: see today's treatment Person educated: Patient and Spouse Education method: Medical Illustrator Education comprehension: verbalized understanding  HOME EXERCISE PROGRAM: Loud /a/, everyday sentences    GOALS: Goals reviewed with patient? No  SHORT TERM GOALS: Target date: 03/21/24-given date of first ST  Produce /a/ with average upper 80s dB over three sessions Baseline: Goal status: not met  2.  pt will generate 3 minutes simple conversation with upper 60s dB average given occasional min A in 3 sessions  Baseline:  Goal status:  not met  3.  pt will improve verbal expression to a functional volume when cued by [name] in 3 sessions  Baseline:  Goal status: not met  4.  If necessary, pt will use strategies provided during MBS with written cues in 3 sessions Baseline:  Goal status: deferred - no overt s/sx oropharyngeal dysphagia   LONG TERM GOALS: Target date: 04/04/24  pt will produce average upper 60s dB in 3 minutes simple conversation in 3 sessions  Baseline:  Goal status: Modified  2.   pt will report in two sessions an improvement in speech volume when cued by [name], occasionally, after 03/21/24 Baseline:  Goal status: INITIAL  3.  pt will report fewer requests to repeat himself than prior to ST Baseline:  Goal status: INITIAL  4.   Pt will use strategies for safer swallowing in 3 sessions with modified independence  Baseline:  Goal status: deferred - no overt s/sx oropharyngeal dysphagia  5.  Pt will score higher/better on PROM in the last 2 weeks of therapy Baseline:  Goal status: INITIAL   ASSESSMENT:  CLINICAL IMPRESSION: Patient is a 73 y.o. male who was seen today for treatment of dysarthria and in light of stage V Parkinson's Disease. See treatment date above for today's date for further details on today's session.  During eval, pt was able to improve loudness in simple conversation for 90 seconds with cues from SLP and thus is a good candidate for ST. Pt without overt s/sx pharyngeal deficits today with sips water  - which has been consistent over this therapy course.   OBJECTIVE IMPAIRMENTS: Objective impairments include executive functioning, aphasia, dysarthria, and dysphagia. These impairments are limiting patient from household responsibilities, ADLs/IADLs, effectively communicating at home and in community, and safety when swallowing.Factors affecting potential to achieve goals and functional outcome are none.. Patient will benefit from skilled SLP services to address above  impairments and improve overall function.  REHAB POTENTIAL: Good  PLAN:  SLP FREQUENCY: 1-2x/week  SLP DURATION: 6 weeks  PLANNED INTERVENTIONS: Aspiration precaution training, Diet toleration management , Environmental controls, Cueing hierachy, Cognitive reorganization, Internal/external aids, Oral motor exercises, Functional tasks, Multimodal communication approach, SLP instruction and feedback, Compensatory strategies, and Patient/family education    [facility], CCC-SLP 03/31/2024, 1:12 PM

## 2024-04-01 ENCOUNTER — Other Ambulatory Visit: Payer: Self-pay | Admitting: Neurology

## 2024-04-01 DIAGNOSIS — G20A2 Parkinson's disease without dyskinesia, with fluctuations: Secondary | ICD-10-CM

## 2024-04-07 ENCOUNTER — Ambulatory Visit

## 2024-04-07 DIAGNOSIS — R1310 Dysphagia, unspecified: Secondary | ICD-10-CM

## 2024-04-07 DIAGNOSIS — R4701 Aphasia: Secondary | ICD-10-CM

## 2024-04-07 DIAGNOSIS — R471 Dysarthria and anarthria: Secondary | ICD-10-CM

## 2024-04-07 DIAGNOSIS — R41841 Cognitive communication deficit: Secondary | ICD-10-CM

## 2024-04-07 NOTE — Therapy (Signed)
 OUTPATIENT SPEECH LANGUAGE PATHOLOGY PARKINSON'S TREATMENT/DISCHARGE SUMMARY   Patient Name: Matthew [name] MRN: [id number] DOB:1950/12/03, 73 y.o., male Today's Date: 04/07/2024  PCP: Matthew Dunnings, MD REFERRING PROVIDER: Evonnie Stabs, DO  END OF SESSION:  End of Session - 04/07/24 1523     Visit Number 9    Number of Visits 13    Date for Recertification  04/04/24    SLP Start Time 1235    SLP Stop Time  1318    SLP Time Calculation (min) 43 min    Activity Tolerance Patient tolerated treatment well              Past Medical History:  Diagnosis Date   Anxiety    Arthritis    GERD (gastroesophageal reflux disease)    occ remote history   Hypertension    Parkinson disease (HCC)    Prostate cancer (HCC)    Right knee pain    questionable meniscal tear   Seasonal allergies    Past Surgical History:  Procedure Laterality Date   COLONOSCOPY     Leg Laceration Repair  Left    Age 58   PELVIC LYMPH NODE DISSECTION Bilateral 03/22/2018   Procedure: PELVIC LYMPH NODE DISSECTION;  Surgeon: Matthew Lonni Righter, MD;  Location: [facility];  Service: Urology;  Laterality: Bilateral;   PROSTATE BIOPSY     ROBOT ASSISTED LAPAROSCOPIC RADICAL PROSTATECTOMY N/A 03/22/2018   Procedure: XI ROBOTIC ASSISTED LAPAROSCOPIC PROSTATECTOMY;  Surgeon: Matthew Lonni Righter, MD;  Location: [facility];  Service: Urology;  Laterality: N/A;   TOTAL KNEE ARTHROPLASTY Right 05/05/2019   Procedure: RIGHT TOTAL KNEE ARTHROPLASTY;  Surgeon: Matthew Lerner, MD;  Location: [facility];  Service: Orthopedics;  Laterality: Right;   Patient Active Problem List   Diagnosis Date Noted   S/P TKR (total knee replacement), right 05/05/2019   Osteoarthritis of right knee 05/02/2019   Prostate cancer (HCC) 03/22/2018   Malignant neoplasm of prostate (HCC) 02/27/2018   SPEECH THERAPY DISCHARGE SUMMARY  Visits from Start of Care: 9  Current functional level related to goals / functional outcomes: See  below. Pt's success was less compared to pt's previous plan of care. His performance was highly dependent upon his fatigue level, and where he was in his medication cycle. He was able to improve his timeframe of more-WNL volume speech (69dB) in conversation of 2+ minutes without cues. As conversation progressed pt's volume decreased, or if conversation began with high cognitive demands maintaining WNL volume was more difficult. See goals below for details.    Remaining deficits: Dyarthria, cognitive communication deficits, dysphagia   Education / Equipment: See session details for more information.   Patient agrees to discharge. Patient goals were partially met (one goal - the rest were not met). This was mainly due to pt's fluctuating performance. Patient is being discharged due to maximized rehab potential.      ONSET DATE: script dated 11/19/23  REFERRING DIAG: Parkinson's Disease  THERAPY DIAG:  No diagnosis found.  Rationale for Evaluation and Treatment: Rehabilitation  SUBJECTIVE:   SUBJECTIVE STATEMENT: Did the playlist work?  Pt accompanied by: self   PERTINENT HISTORY: Pt well-known to this SLP from previous courses of ST. MBS in August 2023- results below. Pt's last ST course resulted in pt partially meeting goal for producing speech in upper 60s dB in short conversational segments.  PAIN:  Are you having pain? No  FALLS: Has patient fallen in last 6 months?  See PT evaluation for details  PATIENT GOALS: Improve loudness and swallowing  OBJECTIVE:   DIAGNOSTIC FINDINGS:  MBS 12-22-21 Patient presents with functional oropharyngeal swallowing without aspiration of any consistency tested. He does demonstrate minimal asymptomatic laryngeal penetration of thin liquid due to decreased timing of laryngeal closure. Chin tuck posture with use of straw effective to prevent penetration. Pharyngeal swallow is strong without retention. Oral transiting discoordination noted  with pt swallowing barium tablet as he required 2nd bolus of thin to transit through oropharynx. Upon esophageal sweep, pt appeared with barium tablet retained in esophagus with pt awareness. Tablet appeared to transit distally after several boluses of liquids. After tablet had transited, pt continued with "phantom" sensation to esophagus. Recommend pt continue diet as tolerated, using chin tuck with liquids if improves comfort, decreases cough. Also encouraged pt to assure maintains strength of voice, cough and expectoration for airway protection.      PATIENT REPORTED OUTCOME MEASURES (PROM): Communication Effectiveness Survey: returned 03/19/24 with pt scoring himself 16/32 with higher scores indicating more effective communication. '1 (not at all effective) with conversation in noisy environment, and conversation with someone across the room.   TODAY'S TREATMENT:                                                                                                                                         DATE:   04/07/24: [name] stated to SLP that pt is having more difficulty recently with swallowing food - trigger is taking longer time to fire. SLP wrote down a  few lingual exercises for pt/wife to complete prior to POs to see if this assists. (See pt instructions) With his HEP, pt completed 5 loud /a/ with average 87dB, and sentences with 83dB with initial model for loudness. Pt encouraged to cont with HEP after d/c.  With complaints of swallowing, (He has more trouble with swallowing - he holds it in his mouth - do you have any suggestions? Matthew [name]) Pt without any overt s/sx with sips water  today. SLP worked with pt with lingual exercises prior to POs (peanut butter cracker) and this appeared to work well with three bites of cracker and 2 sips water .  SLP wrote these town for pt/wife, and also told pt to think Gather and swallow. SLP informed wife of this in empty waiting room following session.   She,  pt, and SLP agree with d/c.  03/31/24: Matthew [name] of water  today without overt s/sx pharyngeal dysphagia. Pt did have some anterior labial leakage during session today and when cued, swallowed to clear. Pt completed everyday sentences with average 81dB. In conversational topics of interest, pt maintained speech volume at 69dB with occasional mod A. Suspect at this stage of his PD this will be his max potential. Likely d/c next session.   03/24/24: Pt reports wife has to continually ask him to repeat; Rarely to occasionally he will use a louder voice. Straw drinking today  without any overt s/sx oropharyngeal dysphagia. Pt rates his fatigue level today 6/10 (10=most fatigued). In everyday sentences pt averaged 83dB, usual mod A needed for incr'd volume. In conversation of 1-2 minutes pt produced average 68dB with occasional min-mod A for loudness. This increased to mod A, occasionally.   03/19/24: Pt's fatigue level commensurate with previous session. Pt states he feels more out of it today. Pt's everyday sentences were average 84dB. Pt req'd usual mod A for sentence responses with louder voice. Limited/no carryover to conversational speech. Homework with loud /a/ and everyday sentences was strongly encouraged. Pt drinking with a straw today without overt s/sx pharyngeal dysphagia.   03/17/24: Pt cont looking more fatigued today - comparable to previous session. Loud /a/ average was 85dB with usual cues for maintaining loudness. Sentences averaged 83dB. SLP guided pt in short conversational segments of 30 seconds-45 seconds, with usual mod A for loudness faded to usual min A for loudness. Loudness fade was prevalent.   03/10/24: Pt appears more tired today than he has the previous 2-3 sessions. Loud /a/ was average 84 dB, with sentences averaged 83dB. SLP then worked with pt with 30-45 second conversational segments with occasional mod-max cues for loudness; Significant loudness fade noted today especially  in last 15 minutes of session. SLP cued pt x4 to swallow saliva during session as anterior labial leakage occurred x2 with pt unaware.   03/05/24: PT NEEDS PROM NEXT SESSION. Today SLP used 15-30 second conversational segments to help pt make louder speech more of a habit. Pt req'd usual mod A for louder speech after 8 seconds. This decr'd to occasional min-mod A after 7 trials.  With pt's loud /a/ he produced average 88dB with occasional mod A for loudness and inhibiting loudness fade. Everyday sentences were produced with average 82dB. Pt was provided homework for loudness.  03/03/24: Pt needs PROM next session. SLP assisted pt in generating new set of everyday sentences. Today pt req'd mod A usually to improve loudness decay in conversation over 8 seconds. SLP had pt third/last today so this may have played a role in this. Upon getting into lobby, pt's loudness decr'd immediately.   01/23/24: After evaluation, pt, SLP, and wife discussed pt's options for therapy at this time, with SLP collaborating with pt and wife what SLP thinks would be most profitable for pt given his input, wife's input, and the deficits highlighted with the evaluation completed today. A MBS is recommended.   PATIENT EDUCATION: Education details: see today's treatment Person educated: Patient and Spouse Education method: Medical Illustrator Education comprehension: verbalized understanding  HOME EXERCISE PROGRAM: Loud /a/, everyday sentences    GOALS: Goals reviewed with patient? No  SHORT TERM GOALS: Target date: 03/21/24-given date of first ST  Produce /a/ with average upper 80s dB over three sessions Baseline: Goal status: not met  2.  pt will generate 3 minutes simple conversation with upper 60s dB average given occasional min A in 3 sessions  Baseline:  Goal status: partially met (2+ minutes)  3.  pt will improve verbal expression to a functional volume when cued by [name] in 3 sessions   Baseline:  Goal status: not met  4.  If necessary, pt will use strategies provided during MBS with written cues in 3 sessions Baseline:  Goal status: deferred - no overt s/sx oropharyngeal dysphagia   LONG TERM GOALS: Target date: 04/04/24  pt will produce average upper 60s dB in 3 minutes simple conversation in 3 sessions  Baseline: 2+ minutes Goal status: partially met  2.   pt will report in two sessions an improvement in speech volume when cued by [name], occasionally, after 03/21/24 Baseline:  Goal status: not met  3.  pt will report fewer requests to repeat himself than prior to ST Baseline:  Goal status: not met  4.   Pt will use strategies for safer swallowing in 3 sessions with modified independence  Baseline:  Goal status: deferred - no overt s/sx oropharyngeal dysphagia  5.  Pt will score higher/better on PROM in the last 2 weeks of therapy Baseline:  Goal status: not met   ASSESSMENT:  CLINICAL IMPRESSION: Patient is a 73 y.o. male who was seen today for treatment of dysarthria and in light of stage V Parkinson's Disease. Swallowing was also addressed. See treatment date above for today's date for further details on today's session.  During eval, pt was able to improve loudness in simple conversation for 90 seconds with cues from SLP and thus is a good candidate for ST.    OBJECTIVE IMPAIRMENTS: Objective impairments include executive functioning, aphasia, dysarthria, and dysphagia. These impairments are limiting patient from household responsibilities, ADLs/IADLs, effectively communicating at home and in community, and safety when swallowing.Factors affecting potential to achieve goals and functional outcome are none.. Patient will benefit from skilled SLP services to address above impairments and improve overall function.  REHAB POTENTIAL: Good  PLAN:  SLP FREQUENCY: 1-2x/week  SLP DURATION: 6 weeks  PLANNED INTERVENTIONS: Aspiration precaution  training, Diet toleration management , Environmental controls, Cueing hierachy, Cognitive reorganization, Internal/external aids, Oral motor exercises, Functional tasks, Multimodal communication approach, SLP instruction and feedback, Compensatory strategies, and Patient/family education    [facility], CCC-SLP 04/07/2024, 3:24 PM

## 2024-04-07 NOTE — Patient Instructions (Signed)
   TRY THIS before eating  Protrude tongue and pull tongue back ( or curl tongue back) x2  Sweep tongue in lower and upper pockets  x2  Push tongue into one cheek and then the other x2  Georgette may need some tactile cues for the second and third exercises

## 2024-05-26 ENCOUNTER — Other Ambulatory Visit (HOSPITAL_COMMUNITY): Payer: Self-pay

## 2024-05-27 ENCOUNTER — Telehealth: Payer: Self-pay | Admitting: Pharmacy Technician

## 2024-05-27 ENCOUNTER — Other Ambulatory Visit (HOSPITAL_COMMUNITY): Payer: Self-pay

## 2024-05-27 NOTE — Progress Notes (Signed)
 PA has been submitted, and telephone encounter has been created. Please see telephone encounter dated 1.6.26.

## 2024-05-27 NOTE — Telephone Encounter (Signed)
 Pharmacy Patient Advocate Encounter   Received notification from Pt Calls Messages that prior authorization for XEOMIN  100 is required/requested.   Insurance verification completed.   The patient is insured through [facility] ADVANTAGE/RX ADVANCE.   Per test claim: PA required; PA submitted to above mentioned insurance via Latent Key/confirmation #/EOC [id number] Status is pending

## 2024-05-28 ENCOUNTER — Other Ambulatory Visit: Payer: Self-pay

## 2024-05-29 ENCOUNTER — Other Ambulatory Visit: Payer: Self-pay

## 2024-05-29 ENCOUNTER — Encounter: Payer: Self-pay | Admitting: Neurology

## 2024-05-29 NOTE — Progress Notes (Signed)
 Patient has chosen not to continue with therapy, patient advocate is aware of the their wishes, patient will be disenrolled.

## 2024-06-10 ENCOUNTER — Other Ambulatory Visit: Payer: Self-pay

## 2024-06-10 ENCOUNTER — Ambulatory Visit: Admitting: Neurology

## 2024-06-10 DIAGNOSIS — K117 Disturbances of salivary secretion: Secondary | ICD-10-CM

## 2024-06-10 MED ORDER — MYOBLOC 5000 UNIT/ML IM SOLN
INTRAMUSCULAR | 2 refills | Status: AC
  Filled 2024-06-10: qty 1, fill #0

## 2024-06-11 ENCOUNTER — Other Ambulatory Visit: Payer: Self-pay | Admitting: Neurology

## 2024-06-11 DIAGNOSIS — G20A2 Parkinson's disease without dyskinesia, with fluctuations: Secondary | ICD-10-CM

## 2024-06-13 ENCOUNTER — Ambulatory Visit: Admitting: Neurology

## 2024-06-20 NOTE — Progress Notes (Unsigned)
 "  Virtual Visit Via Video       Consent was obtained for video visit:  Yes.   Answered questions that patient had about telehealth interaction:  Yes.   I discussed the limitations, risks, security and privacy concerns of performing an evaluation and management service by telemedicine. I also discussed with the patient that there may be a patient responsible charge related to this service. The patient expressed understanding and agreed to proceed.  Pt location: Home Physician Location: office Name of referring provider:  Okey Carlin Redbird, MD I connected with Vicenta Shirk at patients initiation/request on 06/24/2024 at 10:45 AM EST by video enabled telemedicine application and verified that I am speaking with the correct person using two identifiers. Pt MRN:  [id number] Pt DOB:  Nov 10, 1950 Video Participants:  Vicenta Shirk;  wife supplements hx  Assessment/Plan:   1.  Parkinsons Disease  -carbidopa /levodopa  25/100, 2.5 tablets at 9 AM, 2.5 tablets at 11:30 AM, 2.5 tablets 2 PM, 2 tablets at 4:30 PM, 7 PM.    -Continue carbidopa /levodopa  50/200 CR at bedtime.  He goes to bed around 10:30-11pm.    -Continue ropinirole , 1 mg 3 times per day for now.  Likely weaning in future due to memory change  - Discussed that he needs to make sure that he is continuing with exercise once therapies are completed.  I did make a referral to therapies today.  2.  Sialorrhea  -he's using xeomin  now as Myobloc  is on national backorder.  He initially thought that Xeomin  was helpful, but then decided it was not and decided to stop it.  We are attempting to get Myobloc  again, although the 5000 unit is still on national backorder.  Will attempt to get the 2500 units.  3.  Dysphagia  -Modified barium swallow done December 22, 2021 was unremarkable.  -discussed repeating and he/wife are agreeable to do that.  4.  Urinary incontinence  -Following with urology this is not all Parkinson's related.  He has had  prostate cancer with radiation  5.  Neurogenic Orthostatic Hypotension  - Attempted midodrine  5 mg 3 times per day and patient took 2 dosages and stated that he had rash, dizziness, shortness of breath and elevated blood pressure and stated that primary care would take care of blood pressure issues with more natural options.  Did offer to lower the dose of midodrine , but that was declined.  - Certainly agree with compression stockings and salt liberalization.  6.  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  Discussed with patient that the MRI in this age group often will show hardening of the arteries/cerebral small vessel disease and atrophy of the brain.  Discussed that this is not uncommon and would be an incidental finding in this case.  We are ordering the MRI to make sure that we are not missing other nonincidental findings.  Subjective:   Evaan Tidwell was seen today in follow up for Parkinsons disease.  My previous records were reviewed prior to todays visit as well as outside records available to me.  Wife with patient and supplements hx. he stopped Xeomin  as he decided that it was not working.  We did add midodrine  last visit and they called back about 6 weeks later stating that patient had taken it for 2 dosages and had chills, rash, dizziness, shortness of breath and elevated blood pressure.  They stated that they went  to Dr. Okey and stated that Dr. Okey was not concerned with the low blood pressure or heart rate and would like to manage it naturally with compression stockings and salt.  They asked if we had additional options, and we told them we could just lower the dose of midodrine  to 2.5 mg 3 times per day and that was declined.  They note today that they stated that pcp told them that he didn't need compression stockings.  They note more trouble swallowing pills and trouble drinking  without a straw but has trouble pursing lips around straw.  Drooling more, as above.  Regarding memory, he needs reminders for taking meds and may forget what wife has told him.  No hallucinations are noted.     Current prescribed movement disorder medications: carbidopa /levodopa  25/100, 2.5 tablets at 9 AM, 2.5 tablets at 11:30 AM, 2.5 tablets 2 PM, 2 tablets at 4:30 PM, 7 PM Carbidopa /levodopa  50/200 CR at bed  Ropinirole , 1 mg 3 times per day  Midodrine  5 mg 3 times per day (added last visit)  PREVIOUS MEDICATIONS: Requip  (headache, drowsiness, incontinence at only 0.25 mg dose); rotigotine  (feet/leg swelling); pramipexole  (reported feet/ankles swelling at low-dose); selegeline (they thought it caused dizziness); entacapone  (I discontinued when patient complained about sleepiness); inbrija  (tried but didn't think helped);  ALLERGIES:   Allergies  Allergen Reactions   Other     Senior dose of flu shot, cause hear palpitations, anxious   Peanut-Containing Drug Products Other (See Comments)    Nasal congestion    CURRENT MEDICATIONS:  Outpatient Encounter Medications as of 06/24/2024  Medication Sig   ascorbic acid (VITAMIN C) 500 MG tablet Take 500 mg by mouth daily.   Botulinum Toxin Type B (MYOBLOC ) 5000 UNIT/ML SOLN Inject 5000 units into the face and neck every 120 days by the doctor   calcium citrate (CALCITRATE - DOSED IN MG ELEMENTAL CALCIUM) 950 (200 Ca) MG tablet Take 200 mg of elemental calcium by mouth daily.   carbidopa -levodopa  (SINEMET  CR) 50-200 MG tablet TAKE 1 TABLET BY MOUTH EVERYDAY AT BEDTIME   carbidopa -levodopa  (SINEMET  IR) 25-100 MG tablet 2.5 TABLETS AT 9 AM, 2.5 TABLETS AT 11:30 AM, 2.5 TABLETS 2 PM, 2 TABLETS AT 4:30 PM AND 7 PM.   Cholecalciferol (VITAMIN D-3) 125 MCG (5000 UT) TABS Take 125 mcg by mouth daily.   Coenzyme Q10 (CO Q-10) 100 MG CAPS Take by mouth.   docusate sodium  (COLACE) 100 MG capsule Take 100 mg by mouth daily.   Glutathione 500 MG CAPS  Take by mouth.   incobotulinumtoxinA  (XEOMIN ) 100 units SOLR injection Inject 100 units into the head and neck every 90 days   Lithium Orotate 5 MG CAPS Take 1 tablet by mouth 3 (three) times daily as needed. (Patient taking differently: Take 1 tablet by mouth 1 day or 1 dose.)   meloxicam (MOBIC) 15 MG tablet Take 15 mg by mouth daily.   midodrine  (PROAMATINE ) 5 MG tablet Take 1 tablet (5 mg total) by mouth 3 (three) times daily with meals. (Patient not taking: Reported on 01/23/2024)   NON FORMULARY Take 1 tablet by mouth daily. CDP Choline 500 mg   Omega-3 Fatty Acids (OMEGA 3 500 PO) Take by mouth.   rOPINIRole  (REQUIP ) 1 MG tablet TAKE 1 TABLET BY MOUTH THREE TIMES A DAY   vitamin B-12 (CYANOCOBALAMIN ) 500 MCG tablet Take 1,000 mcg by mouth daily.   Facility-Administered Encounter Medications as of 06/24/2024  Medication   incobotulinumtoxinA  (XEOMIN ) 100 units  injection 100 Units   incobotulinumtoxinA  (XEOMIN ) 100 units injection 100 Units    Objective:   PHYSICAL EXAMINATION:    VITALS:   There were no vitals filed for this visit.   GEN:  The patient appears stated age and is in NAD.   HEENT:  Normocephalic, atraumatic.  The mucous membranes are moist. The superficial temporal arteries are without ropiness or tenderness.  Has a drool cloth but not really using it  Neurological examination:  Orientation: The patient is alert and oriented x3. Cranial nerves: There is good facial symmetry with facial hypomimia. The speech is fluent and mildly dysarthric and hypophonic.  Speech lacks spontaneity.  Hearing is intact to conversational tone. Motor: Strength is at least antigravity x4.  Movement examination: Abnormal movements: none Coordination:  there is mod decremation on the L>R in the UE's   I have reviewed and interpreted the following labs independently    Chemistry      Component Value Date/Time   NA 135 05/06/2019 0230   K 4.0 05/06/2019 0230   CL 104 05/06/2019 0230    CO2 24 05/06/2019 0230   BUN 22 05/06/2019 0230   CREATININE 0.98 05/06/2019 0230      Component Value Date/Time   CALCIUM 8.7 (L) 05/06/2019 0230       Lab Results  Component Value Date   WBC 9.6 05/06/2019   HGB 9.9 (L) 05/06/2019   HCT 28.9 (L) 05/06/2019   MCV 94.4 05/06/2019   PLT 141 (L) 05/06/2019    No results found for: TSH   Follow up Instructions      -I discussed the assessment and treatment plan with the patient. The patient was provided an opportunity to ask questions and all were answered. The patient agreed with the plan and demonstrated an understanding of the instructions.   The patient was advised to call back or seek an in-person evaluation if the symptoms worsen or if the condition fails to improve as anticipated.    Total time spent on today's visit was 40 minutes, including both face-to-face time and nonface-to-face time.  Time included that spent on review of records (prior notes available to me/labs/imaging if pertinent), discussing treatment and goals, answering patient's questions and coordinating care.   Asberry Schneider, DO   Cc:  Okey Carlin Redbird, MD "

## 2024-06-21 ENCOUNTER — Other Ambulatory Visit: Payer: Self-pay | Admitting: Neurology

## 2024-06-21 DIAGNOSIS — G20A2 Parkinson's disease without dyskinesia, with fluctuations: Secondary | ICD-10-CM

## 2024-06-24 ENCOUNTER — Telehealth (HOSPITAL_COMMUNITY): Payer: Self-pay | Admitting: *Deleted

## 2024-06-24 ENCOUNTER — Other Ambulatory Visit: Payer: Self-pay

## 2024-06-24 ENCOUNTER — Telehealth: Admitting: Neurology

## 2024-06-24 ENCOUNTER — Other Ambulatory Visit (HOSPITAL_COMMUNITY): Payer: Self-pay | Admitting: *Deleted

## 2024-06-24 DIAGNOSIS — G20A2 Parkinson's disease without dyskinesia, with fluctuations: Secondary | ICD-10-CM

## 2024-06-24 DIAGNOSIS — K117 Disturbances of salivary secretion: Secondary | ICD-10-CM

## 2024-06-24 DIAGNOSIS — G903 Multi-system degeneration of the autonomic nervous system: Secondary | ICD-10-CM

## 2024-06-24 DIAGNOSIS — R413 Other amnesia: Secondary | ICD-10-CM

## 2024-06-24 DIAGNOSIS — G20A1 Parkinson's disease without dyskinesia, without mention of fluctuations: Secondary | ICD-10-CM

## 2024-06-24 DIAGNOSIS — F02A0 Dementia in other diseases classified elsewhere, mild, without behavioral disturbance, psychotic disturbance, mood disturbance, and anxiety: Secondary | ICD-10-CM

## 2024-06-24 DIAGNOSIS — R1319 Other dysphagia: Secondary | ICD-10-CM

## 2024-06-24 DIAGNOSIS — R1310 Dysphagia, unspecified: Secondary | ICD-10-CM

## 2024-06-24 MED ORDER — MYOBLOC 2500 UNIT/0.5ML IM SOLN
2500.0000 [IU] | Freq: Once | INTRAMUSCULAR | 0 refills | Status: AC
  Filled 2024-06-24: qty 0.5, 1d supply, fill #0

## 2024-06-25 ENCOUNTER — Ambulatory Visit: Payer: Self-pay

## 2024-06-25 ENCOUNTER — Ambulatory Visit: Payer: Self-pay | Admitting: Psychology

## 2024-06-25 DIAGNOSIS — G20A2 Parkinson's disease without dyskinesia, with fluctuations: Secondary | ICD-10-CM | POA: Diagnosis not present

## 2024-06-25 DIAGNOSIS — F04 Amnestic disorder due to known physiological condition: Secondary | ICD-10-CM | POA: Diagnosis not present

## 2024-06-25 DIAGNOSIS — R4189 Other symptoms and signs involving cognitive functions and awareness: Secondary | ICD-10-CM

## 2024-06-25 NOTE — Progress Notes (Unsigned)
 "  NEUROPSYCHOLOGICAL EVALUATION [place]. [facility]   Department of Neurology  Date of Evaluation: 06/25/2024  REASON FOR REFERRAL   Matthew [name] is a 74 year old, right-handed, White male with 12 years of formal education. He was referred for neuropsychological evaluation by his neurologist, Asberry Tat, D.O., to assess current neurocognitive functioning, document potential cognitive deficits, and assist with treatment planning in the setting of Parkinson's disease. This is his first neuropsychological evaluation.  SUMMARY OF RESULTS   Premorbid cognitive abilities are estimated to be in the average range based on word reading and sociodemographic factors. Relative to this baseline estimate, current performance was variable. Weaknesses were observed in processing speed, executive functioning, and memory, whereas working memory, confrontation naming, and visuospatial abilities remained relatively intact.  Processing speed was reduced on tasks of visual scanning, rapid color naming, and rapid word reading, while performance was adequate on a task of rapid counting. Regarding executive functioning, he was slow to complete a measure of alternating attention but did so without errors. Verbal fluency was below expectations. Performance on a response inhibition task was adequate; however, speed declined and errors emerged when a switching component was introduced. Verbal abstract reasoning was intact.  Verbal memory was generally below expectations when encoding, recalling, and recognizing both a word list and short stories. The only exception was relatively preserved recognition of the story details. In contrast, visual memory performance was on the lower end of the normal range for learning and recognizing shapes.  On self-report questionnaires, he reported moderate symptoms of anxiety, some of which may overlap with physical features of Parkinsons disease. He did not report  significant symptoms of depression.  DIAGNOSTIC IMPRESSION   Results of the current evaluation indicate primary deficits in processing speed, executive functioning, and verbal memory (especially encoding and retrieval). 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 Upcoming MRI will be helpful in evaluating for structural abnormalities, including small vessel ischemic changes, that may also be contributing to the overall clinical picture. At this time, findings are not strongly suggestive of a coexisting Alzheimers disease process, although this cannot be definitively ruled out.  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 Reevaluation should occur during a period of medical and affective stability.  Prioritize physical health through diet, exercise, and sleep. Regular physical activity supports cardiovascular health, improves mood, and helps preserve mobility and independence. Aim for at least 150 minutes of moderate aerobic exercise per week (e.g., brisk walking, swimming, gardening). A brain-healthy diet such as the Mediterranean or MIND diet is rich in fruits, vegetables, whole grains, healthy fats, and lean proteins, and has been associated with reduced risk of cognitive decline. Additionally, getting adequate, quality sleep and managing chronic conditions with the help of healthcare providers are essential components of healthy aging.  Continue to stay socially and mentally engaged. Maintaining strong social connections and regularly stimulating your brain can help protect against cognitive decline. This includes staying connected with friends  and family, volunteering, or participating in community groups. Mentally engaging activities--such as reading, doing puzzles, playing strategy games, or learning a new language or musical instrument--promote brain plasticity. If you are interested in activities to support cognitive engagement, this site offers a variety of apps and games organized  by difficulty level:  https://www.[facility]row[facility].org/get-to-know-barrow/centers-programs/neurorehabilitation-center/neuro-rehab-apps-and-games/  Consider implementing compensatory strategies to maximize independence and maintain daily functioning. Examples include:  Adhere to routine. Compensatory strategies work best when they are used consistently. Use a planner, calendar, or white board that has the schedule and important events for the day clearly listed to reference and cross off when tasks are complete.  Ask for written information, especially if it is new or unfamiliar (e.g., information provided at a doctor's appointment).  Create an organized environment. Keep items that can be easily misplaced in a sensible location and get into the habit of always returning the items to those places. Pay attention and reduce distractions. Make a point of focusing attention on information you want to remember. One-on-one interaction is more likely to facilitate attention and minimize distraction. Make eye contact and repeat the information out loud after you hear it. Reduce interruptions or distractions especially when attempting to learn new information.  Create associations. When learning something new, think about and understand the information. Explain it in your own words or try to associate it with something you already know. Take notes to help remember important details. Evaluate goals and plan accordingly. When confronted by many different tasks, begin by making a list that prioritizes each task and estimates the time it will take to complete. Break down  complicated tasks into smaller, more manageable steps. Focus on one task at a time and complete each task before starting another. Avoid multitasking.  Additional resources  The [facility] Division of Aging works to promote the independence and enhance the dignity of [place] 's older adults, persons with disabilities and their families, through a community-based system of opportunities, services, benefits and protections: geneblogs[employer]si [place] connects older adults and individuals with health plan benefits to vetted companions [employer]) who provide social support, assistance with everyday tasks, and companionship: papa.com  If patient requires legal assistance with durable powers of attorney, medical decision making, long-term care resource access, or other aspects of estate planning, they may consider contacting [employer] at [phone] for a free consultation.  DISPOSITION   No follow-up neuropsychological testing was scheduled at this time. Please feel free to refer the patient for repeated evaluation if he shows a significant change in neurocognitive status. He and his wife will be provided verbal feedback in approximately one week regarding the findings and impression during this visit.  The remainder of the report includes the details of the patient's background and a table of results from the current evaluation, which support the summary and recommendations described above.  BACKGROUND   History of Presenting Illness: The following information was obtained from a review of medical records and an interview with the patient and his wife, [name]. 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 Evonnie in August 2022. Current symptoms being actively managed include sialorrhea, dysphagia, and neurogenic orthostatic hypotension. At the most recent visit with Dr.  Evonnie on 06/24/2024, concerns regarding memory were raised, including the need for reminders to take medications and occasional difficulty recalling information conveyed by his wife. These concerns prompted a referral for neuropsychological evaluation.  Cognitive Functioning: During todays appointment, the patient and his wife reported cognitive changes emerging within the past year. They described mild forgetfulness, including losing his train of thought during conversations, forgetting to take medications, and occasionally misplacing items. However, they noted that he does not typically forget major conversations or repeat himself. They endorsed  word-finding difficulties and a relative slowing of processing speed. They denied significant concerns related to attention, navigation, or depth perception. They were unable to meaningfully comment on executive functioning, as the patient does not currently face substantial executive demands in his daily life. He previously enjoyed reading and completing crossword puzzles, but due to physical limitations, he engages in these activities much less frequently.  Physical Functioning: Patient denied difficulties with sleep initiation or maintenance but reported significant difficulty with awakening. He described marked morning grogginess, difficulty opening his eyes, and increased dysphagia upon waking. These symptoms were attributed to possible medication wearing off. He recently began taking melatonin, and his wife noted a reduction in yelling/talking during sleep, although he continues to move. Appetite remains stable despite a relative reduction in smell and taste. He endorsed visual changes, including occasional blurry vision and words disappearing off the page. He and his wife explained that this is likely related to bifocal use and his stooped posture, which prevents him from consistently viewing through the correct portion of the lenses. He has an upcoming  appointment scheduled to adjust his glasses. He denied any hearing loss. He reported making a conscious effort to speak more loudly and clearly due to perceived changes in his speech. He continues to experience rigidity, balance difficulties, and falls, with two falls occurring in the past month. He denied the presence of tremor.  Emotional Functioning: Patient described his recent mood as not happy. He denied suicidal ideation but acknowledged occasional passive thoughts about whether life is worth living, as well as situational periods of low mood. When asked about contributing factors, he reported frustration related to limited activity and spending much of his time indoors. His wife added that recent weather conditions have further restricted his activity, leading to increased time indoors and heightened concern about potential power outages due to the lack of a backup generator. Otherwise, his mood is generally adequate. He receives regular visits from family members.  Neuroimaging: MRI of the brain has been ordered but is not yet scheduled. DaTscan  (10/02/2019) showed decreased radiotracer uptake in the bilateral striatum with faint caudate head retention, suggestive of Parkinson's if not better explained by pharmacologic interference.  Other Relevant Medical History: Remarkable for history of prostate cancer. Please refer to the medical record for a more comprehensive problem list. No history of stroke, CNS infection, head injury, or seizure was reported.  Current Medications: Per record, calcium citrate, carbidopa -levodopa , CDP Choline, coenzyme Q10, docusate sodium , glutathione, lithium orotate, meloxicam, Myobloc , omega-3 fatty acids, ropinirole , vitamin B12, vitamin C, vitamin D3, and Xeomin .   Functional Status: Patient independently performs most basic activities of daily living, although he requires some assistance (e.g., bathing, dressing) from his wife due to physical limitations related  to Parkinsons disease. He stopped driving approximately two to three years ago because of Parkinsons, especially reduced reaction time. He uses a phone application to remind him to take his medications and was previously reliable; however, he has recently missed doses despite the reminders and now requires prompts from his wife. She also manages the household finances and prepares meals, which has been their longstanding arrangement. Patient is able to safely operate household appliances, including using the stove and microwave.  Family Neurological History: There is suspicion that the patients maternal grandfather may have had Parkinsons disease, based on family recollections of symptoms he exhibited prior to age 9; however, at that time, the condition was not formally diagnosed and was referred to as the palsy.  Psychiatric History: History  of depression, anxiety, prior mental health treatment, suicidal ideation, hallucinations, and psychiatric hospitalizations was not reported.  Substance Use History: Patient consumes a small glass of bourbon approximately once or twice per week and smokes tobacco in a pipe. He denies current use of marijuana or other illicit substances and reports no history of problematic substance use.  Social and Developmental History: Patient was born in [place], [place]. History of perinatal complications and developmental delays was not reported. He is married and lives with his wife. They have two children.  Educational and Occupational History: No history of childhood learning disability, special education services, or grade retention was reported. Patient reported generally earning good grades throughout school. He graduated from high school on time and completed one year of community college. 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  BEHAVIORAL  OBSERVATIONS   Patient arrived on time and was accompanied by his wife, [name]. He ambulated with a walker, with a gait characterized by short, shuffling steps. He was alert and fully oriented. He was appropriately groomed and dressed for the setting. No significant motor abnormalities were observed. Vision (with glasses) and hearing were adequate for testing purposes. Speech was occasionally quiet and mumbled but otherwise of normal rate and prosody. No conversational word-finding difficulties, paraphasic errors, or dysarthria were observed. Comprehension was conversationally intact. Thought processes were linear, logical, and coherent, although he was generally slow to respond. Thought content was organized and devoid of delusions. Insight appeared appropriate. Affect was flat. He was cooperative and appeared to give adequate effort during testing, including on embedded measures of performance validity. Results are thought to accurately reflect his cognitive functioning at this time.  NEUROPSYCHOLOGICAL TESTING RESULTS   Tests Administered: Animal Naming Test; Controlled Oral Word Association Test (COWAT): FAS; Delis-Kaplan Executive Function System (D-KEFS) - Subtest(s): Color-Word Interference Test; Geriatric Anxiety Scale-10 Item (GAS-10); Geriatric Depression Scale Short Form (GDS-SF); Hopkins Verbal Learning Test-Revised (HVLT-R) - From 1; Neuropsychological Assessment Battery (NAB) Form 1 - Subtest(s): Naming, Visual Discrimination, Shape Learning; Test of Premorbid Functioning (TOPF); Trail Making Test (TMT); Wechsler Adult Intelligence Scale Fifth Edition (WAIS-5) - Subtest(s): Similarities, Clinical Cytogeneticist, Digits Forward, Digit Sequencing, Digits Backward, Naming Speed Quantity; and Wechsler Memory Scale Fourth Edition (WMS-IV) - Subtest(s): Logical Memory (LM).  Test results are provided in the table below. Whenever possible, the patient's scores were compared against age-, sex-, and  education-corrected normative samples. Interpretive descriptions are based on the AACN consensus conference statement on uniform labeling (Guilmette et al., 2020).  PREMORBID FUNCTIONING RAW  RANGE  TOPF 52 StdS=109 Average  ATTENTION & WORKING MEMORY RAW  RANGE  WAIS-5 Digits Forward -- ss=9 Average  WAIS-5 Digits Backward -- ss=9 Average  WAIS-5 Digit Sequencing -- ss=6 Low Average  PROCESSING SPEED RAW  RANGE  Trails A 90''0e T=21 Exceptionally Low  WAIS-5 Naming Speed Quantity -- ss=7 Low Average  DKEFS CWIT Color Naming 59''1e ss=1 Exceptionally Low  DKEFS CWIT Word Reading 37''0e ss=4 Below Average  EXECUTIVE FUNCTION RAW  RANGE  Trails B 191''0e T=35 Below Average  WAIS-5 Similarities -- ss=8 Average  COWAT Letter Fluency 8+6+9 T=36 Below Average  DKEFS CWIT Inhibition 96''4e ss=6 Low Average  DKEFS CWIT Inhibition/Switching 115''10e ss=5 Below Average  LANGUAGE RAW  RANGE  COWAT Letter Fluency 8+6+9 T=36 Below Average  Animal Naming Test 8 T=24 Exceptionally Low  NAB Naming Test 30/31 T=57 WNL  VISUOSPATIAL RAW  RANGE  WAIS-5 Block Design -- ss=8 Average  NAB Visual Discrimination 14/18 T=44 Average  VERBAL LEARNING & MEMORY RAW  RANGE  HVLT-R Learning Trials (3+3+4)/36 T=23 Exceptionally Low  HVLT-R Delayed Recall 2/12 T=26 Exceptionally Low  HVLT-R Recognition Hits 8 -- --  HVLT-R Recognition False Positives 3 -- --  HVLT-R Discrimination Index 5 T=20 Exceptionally Low  WMS-IV LM-I  (5+7+6)/53 ss=5 Below Average  WMS-IV LM-II  (0+3)/39 ss=3 Exceptionally Low  WMS-IV LM Recognition  (6+11)/23 26-50%ile Average  VISUAL LEARNING & MEMORY RAW  RANGE  NAB Shape Learning IR (3+4+4)/27 T=41 Low Average  NAB Shape Learning DR 3/9 T=38 Low Average  NAB Shape Learning DFCR 6h, 69fa T=42 Low Average  QUESTIONNAIRES RAW  RANGE  GDS-SF 3 -- Minimal  GAS-10 10 -- Moderate  *Note: ss = scaled score; StdS = standard score; T = t-score; C/S = corrected raw score; WNL = within normal  limits; BNL= below normal limits; D/C = discontinued. Scores from skewed distributions are typically interpreted as WNL (>=16th %ile) or BNL (<16th %ile).   INFORMED CONSENT   Patient was provided with a verbal description of the nature and purpose of the neuropsychological evaluation. Also reviewed were the foreseeable risks and/or discomforts and benefits of the procedure, limits of confidentiality, and mandatory reporting requirements of this provider. Patient was given the opportunity to have their questions answered. Oral consent to participate was provided by the patient.   This report was prepared as part of a clinical evaluation and is not intended for forensic use.  SERVICE   This evaluation was conducted by Renda Beckwith, Psy.D. In addition to time spent directly with the patient, total professional time (180 minutes) includes record review, integration of relevant medical history, test selection, interpretation of findings, and report preparation. A technician, Lonell Jude, B.S., provided testing and scoring assistance (132 minutes).  Psychiatric Diagnostic Evaluation Services (Professional): 09208 x 1 Neuropsychological Testing Evaluation Services (Professional): 03867 x 1 Neuropsychological Testing Evaluation Services (Professional): 03866 x 2 Neuropsychological Test Administration and Scoring (Technician): [phone] x 1 Neuropsychological Test Administration and Scoring (Technician): [phone] x 3  This report was generated using voice recognition software. While this document has been carefully reviewed, transcription errors may be present. I apologize in advance for any inconvenience. Please contact me if further clarification is needed.            Renda Beckwith, Psy.D.             Neuropsychologist  "

## 2024-06-25 NOTE — Progress Notes (Signed)
" ° °  Psychometrician Note   Cognitive testing was administered to [facility] by Matthew [name], B.S. (psychometrist) under the supervision of Dr. Renda Beckwith, Psy.D., licensed psychologist on 06/25/2024. Matthew [name] did not appear overtly distressed by the testing session per behavioral observation or responses across self-report questionnaires. Rest breaks were offered.   The battery of tests administered was selected by Dr. Renda Beckwith, Psy.D. with consideration to Matthew [name]'s current level of functioning, the nature of his symptoms, emotional and behavioral responses during interview, level of literacy, observed level of motivation/effort, and the nature of the referral question. This battery was communicated to the psychometrist. Communication between Dr. Renda Beckwith, Psy.D. and the psychometrist was ongoing throughout the evaluation and Dr. Renda Beckwith, Psy.D. was immediately accessible at all times. Dr. Renda Beckwith, Psy.D. provided supervision to the psychometrist on the date of this service to the extent necessary to assure the quality of all services provided.    Matthew [name] will return within approximately 1-2 weeks for an interactive feedback session with Dr. Beckwith at which time his test performances, clinical impressions, and treatment recommendations will be reviewed in detail. Matthew [name] understands he can contact our office should he require our assistance before this time.  A total of 132 minutes of billable time were spent face-to-face with Matthew [name] by the psychometrist. This includes both test administration and scoring time. Billing for these services is reflected in the clinical report generated by Dr. Renda Beckwith, Psy.D.  This note reflects time spent with the psychometrician and does not include test scores or any clinical interpretations made by Dr. Beckwith. The full report will follow in a separate note. "

## 2024-07-01 ENCOUNTER — Ambulatory Visit: Admitting: Neurology

## 2024-07-01 ENCOUNTER — Ambulatory Visit (HOSPITAL_COMMUNITY)

## 2024-07-03 ENCOUNTER — Encounter: Payer: Self-pay | Admitting: Psychology

## 2024-07-22 ENCOUNTER — Encounter (HOSPITAL_COMMUNITY)

## 2024-07-31 ENCOUNTER — Ambulatory Visit

## 2024-07-31 ENCOUNTER — Ambulatory Visit: Admitting: Physical Therapy

## 2024-07-31 ENCOUNTER — Ambulatory Visit: Admitting: Occupational Therapy

## 2024-11-24 ENCOUNTER — Ambulatory Visit: Payer: Self-pay | Admitting: Neurology

## 2024-12-25 ENCOUNTER — Ambulatory Visit: Admitting: Neurology
